# Patient Record
Sex: Male | Born: 1935 | Race: White | Hispanic: No | Marital: Married | Smoking: Former smoker
Health system: Southern US, Community
[De-identification: ages and names within clinical notes are randomized; demographics above are authoritative.]

## PROBLEM LIST (undated history)

## (undated) DIAGNOSIS — I639 Cerebral infarction, unspecified: Secondary | ICD-10-CM

## (undated) DIAGNOSIS — E213 Hyperparathyroidism, unspecified: Secondary | ICD-10-CM

## (undated) DIAGNOSIS — Z8546 Personal history of malignant neoplasm of prostate: Secondary | ICD-10-CM

## (undated) DIAGNOSIS — G4733 Obstructive sleep apnea (adult) (pediatric): Secondary | ICD-10-CM

## (undated) DIAGNOSIS — E291 Testicular hypofunction: Secondary | ICD-10-CM

## (undated) DIAGNOSIS — C801 Malignant (primary) neoplasm, unspecified: Secondary | ICD-10-CM

## (undated) DIAGNOSIS — I350 Nonrheumatic aortic (valve) stenosis: Secondary | ICD-10-CM

## (undated) DIAGNOSIS — F32A Depression, unspecified: Secondary | ICD-10-CM

## (undated) DIAGNOSIS — B029 Zoster without complications: Secondary | ICD-10-CM

## (undated) DIAGNOSIS — G459 Transient cerebral ischemic attack, unspecified: Secondary | ICD-10-CM

## (undated) DIAGNOSIS — I219 Acute myocardial infarction, unspecified: Secondary | ICD-10-CM

## (undated) DIAGNOSIS — D649 Anemia, unspecified: Secondary | ICD-10-CM

## (undated) DIAGNOSIS — I6522 Occlusion and stenosis of left carotid artery: Secondary | ICD-10-CM

## (undated) DIAGNOSIS — N200 Calculus of kidney: Secondary | ICD-10-CM

## (undated) DIAGNOSIS — N184 Chronic kidney disease, stage 4 (severe): Secondary | ICD-10-CM

## (undated) DIAGNOSIS — Z9889 Other specified postprocedural states: Secondary | ICD-10-CM

## (undated) DIAGNOSIS — R5383 Other fatigue: Secondary | ICD-10-CM

## (undated) DIAGNOSIS — E785 Hyperlipidemia, unspecified: Secondary | ICD-10-CM

## (undated) DIAGNOSIS — E119 Type 2 diabetes mellitus without complications: Secondary | ICD-10-CM

## (undated) DIAGNOSIS — F419 Anxiety disorder, unspecified: Secondary | ICD-10-CM

## (undated) DIAGNOSIS — I480 Paroxysmal atrial fibrillation: Secondary | ICD-10-CM

## (undated) DIAGNOSIS — I2510 Atherosclerotic heart disease of native coronary artery without angina pectoris: Secondary | ICD-10-CM

## (undated) DIAGNOSIS — E538 Deficiency of other specified B group vitamins: Secondary | ICD-10-CM

## (undated) DIAGNOSIS — F329 Major depressive disorder, single episode, unspecified: Secondary | ICD-10-CM

## (undated) DIAGNOSIS — E039 Hypothyroidism, unspecified: Secondary | ICD-10-CM

## (undated) DIAGNOSIS — M1990 Unspecified osteoarthritis, unspecified site: Secondary | ICD-10-CM

## (undated) DIAGNOSIS — J449 Chronic obstructive pulmonary disease, unspecified: Secondary | ICD-10-CM

## (undated) DIAGNOSIS — E669 Obesity, unspecified: Secondary | ICD-10-CM

## (undated) DIAGNOSIS — M353 Polymyalgia rheumatica: Secondary | ICD-10-CM

## (undated) HISTORY — DX: Obstructive sleep apnea (adult) (pediatric): G47.33

## (undated) HISTORY — DX: Other fatigue: R53.83

## (undated) HISTORY — DX: Anxiety disorder, unspecified: F41.9

## (undated) HISTORY — DX: Nonrheumatic aortic (valve) stenosis: I35.0

## (undated) HISTORY — PX: APPENDECTOMY: SHX54

## (undated) HISTORY — DX: Chronic kidney disease, stage 4 (severe): N18.4

## (undated) HISTORY — DX: Testicular hypofunction: E29.1

## (undated) HISTORY — DX: Transient cerebral ischemic attack, unspecified: G45.9

## (undated) HISTORY — PX: PROSTATECTOMY: SHX69

## (undated) HISTORY — PX: CYSTECTOMY: SUR359

## (undated) HISTORY — DX: Occlusion and stenosis of left carotid artery: I65.22

## (undated) HISTORY — PX: HERNIA REPAIR: SHX51

## (undated) HISTORY — DX: Hyperlipidemia, unspecified: E78.5

## (undated) HISTORY — PX: VASECTOMY: SHX75

## (undated) HISTORY — DX: Cerebral infarction, unspecified: I63.9

## (undated) HISTORY — DX: Type 2 diabetes mellitus without complications: E11.9

## (undated) HISTORY — DX: Deficiency of other specified B group vitamins: E53.8

## (undated) HISTORY — DX: Depression, unspecified: F32.A

## (undated) HISTORY — PX: LUMBAR SPINE SURGERY: SHX701

## (undated) HISTORY — PX: OTHER SURGICAL HISTORY: SHX169

## (undated) HISTORY — DX: Obesity, unspecified: E66.9

## (undated) HISTORY — DX: Hypothyroidism, unspecified: E03.9

## (undated) HISTORY — DX: Calculus of kidney: N20.0

## (undated) HISTORY — DX: Hyperparathyroidism, unspecified: E21.3

## (undated) HISTORY — DX: Personal history of malignant neoplasm of prostate: Z85.46

## (undated) HISTORY — DX: Other specified postprocedural states: Z98.890

## (undated) HISTORY — DX: Chronic obstructive pulmonary disease, unspecified: J44.9

## (undated) HISTORY — DX: Anemia, unspecified: D64.9

## (undated) HISTORY — PX: CHOLECYSTECTOMY: SHX55

## (undated) HISTORY — DX: Major depressive disorder, single episode, unspecified: F32.9

## (undated) HISTORY — DX: Polymyalgia rheumatica: M35.3

## (undated) HISTORY — DX: Atherosclerotic heart disease of native coronary artery without angina pectoris: I25.10

## (undated) HISTORY — DX: Unspecified osteoarthritis, unspecified site: M19.90

## (undated) HISTORY — DX: Paroxysmal atrial fibrillation: I48.0

## (undated) HISTORY — DX: Zoster without complications: B02.9

## (undated) HISTORY — PX: TOTAL KNEE ARTHROPLASTY: SHX125

---

## 1998-03-25 ENCOUNTER — Inpatient Hospital Stay (HOSPITAL_COMMUNITY): Admission: RE | Admit: 1998-03-25 | Discharge: 1998-03-28 | Payer: Self-pay | Admitting: Urology

## 1998-07-20 ENCOUNTER — Other Ambulatory Visit: Admission: RE | Admit: 1998-07-20 | Discharge: 1998-07-20 | Payer: Self-pay | Admitting: Urology

## 1999-08-13 ENCOUNTER — Encounter: Payer: Self-pay | Admitting: Emergency Medicine

## 1999-08-13 ENCOUNTER — Inpatient Hospital Stay (HOSPITAL_COMMUNITY): Admission: EM | Admit: 1999-08-13 | Discharge: 1999-08-16 | Payer: Self-pay | Admitting: Emergency Medicine

## 2000-08-09 ENCOUNTER — Encounter: Payer: Self-pay | Admitting: Urology

## 2000-08-17 ENCOUNTER — Inpatient Hospital Stay (HOSPITAL_COMMUNITY): Admission: RE | Admit: 2000-08-17 | Discharge: 2000-08-21 | Payer: Self-pay | Admitting: Specialist

## 2002-09-30 ENCOUNTER — Emergency Department (HOSPITAL_COMMUNITY): Admission: EM | Admit: 2002-09-30 | Discharge: 2002-09-30 | Payer: Self-pay | Admitting: Emergency Medicine

## 2004-12-12 ENCOUNTER — Emergency Department (HOSPITAL_COMMUNITY): Admission: EM | Admit: 2004-12-12 | Discharge: 2004-12-12 | Payer: Self-pay | Admitting: Emergency Medicine

## 2004-12-27 ENCOUNTER — Ambulatory Visit: Payer: Self-pay | Admitting: Family Medicine

## 2005-01-30 ENCOUNTER — Ambulatory Visit: Payer: Self-pay | Admitting: Family Medicine

## 2005-03-28 ENCOUNTER — Ambulatory Visit: Payer: Self-pay | Admitting: Family Medicine

## 2005-12-19 ENCOUNTER — Ambulatory Visit: Payer: Self-pay | Admitting: Family Medicine

## 2006-02-08 ENCOUNTER — Ambulatory Visit: Payer: Self-pay | Admitting: Family Medicine

## 2006-02-22 ENCOUNTER — Ambulatory Visit: Payer: Self-pay | Admitting: Family Medicine

## 2006-11-28 ENCOUNTER — Emergency Department (HOSPITAL_COMMUNITY): Admission: EM | Admit: 2006-11-28 | Discharge: 2006-11-28 | Payer: Self-pay | Admitting: Emergency Medicine

## 2007-04-16 ENCOUNTER — Ambulatory Visit: Payer: Self-pay | Admitting: Family Medicine

## 2007-04-16 LAB — CONVERTED CEMR LAB
ALT: 17 units/L (ref 0–40)
AST: 28 units/L (ref 0–37)
Alkaline Phosphatase: 50 units/L (ref 39–117)
BUN: 26 mg/dL — ABNORMAL HIGH (ref 6–23)
Basophils Relative: 0.2 % (ref 0.0–1.0)
CO2: 33 meq/L — ABNORMAL HIGH (ref 19–32)
Calcium: 9.2 mg/dL (ref 8.4–10.5)
Chloride: 108 meq/L (ref 96–112)
Cholesterol: 220 mg/dL (ref 0–200)
Creatinine, Ser: 1.4 mg/dL (ref 0.4–1.5)
Hemoglobin: 14.9 g/dL (ref 13.0–17.0)
Monocytes Absolute: 0.6 10*3/uL (ref 0.2–0.7)
Monocytes Relative: 9.8 % (ref 3.0–11.0)
Platelets: 268 10*3/uL (ref 150–400)
RBC: 3.82 M/uL — ABNORMAL LOW (ref 4.22–5.81)
RDW: 13.7 % (ref 11.5–14.6)
Total Bilirubin: 1 mg/dL (ref 0.3–1.2)
Total CHOL/HDL Ratio: 4.1
Total Protein: 7.2 g/dL (ref 6.0–8.3)
VLDL: 17 mg/dL (ref 0–40)

## 2008-04-21 ENCOUNTER — Ambulatory Visit: Payer: Self-pay | Admitting: Family Medicine

## 2008-04-21 DIAGNOSIS — D649 Anemia, unspecified: Secondary | ICD-10-CM

## 2008-04-21 DIAGNOSIS — M722 Plantar fascial fibromatosis: Secondary | ICD-10-CM

## 2008-04-21 DIAGNOSIS — M129 Arthropathy, unspecified: Secondary | ICD-10-CM | POA: Insufficient documentation

## 2008-04-21 DIAGNOSIS — E785 Hyperlipidemia, unspecified: Secondary | ICD-10-CM

## 2008-04-21 DIAGNOSIS — N390 Urinary tract infection, site not specified: Secondary | ICD-10-CM

## 2008-04-21 DIAGNOSIS — M7150 Other bursitis, not elsewhere classified, unspecified site: Secondary | ICD-10-CM

## 2008-04-21 DIAGNOSIS — T50995A Adverse effect of other drugs, medicaments and biological substances, initial encounter: Secondary | ICD-10-CM

## 2008-04-21 DIAGNOSIS — J309 Allergic rhinitis, unspecified: Secondary | ICD-10-CM | POA: Insufficient documentation

## 2008-04-21 DIAGNOSIS — E039 Hypothyroidism, unspecified: Secondary | ICD-10-CM | POA: Insufficient documentation

## 2008-04-21 DIAGNOSIS — I2510 Atherosclerotic heart disease of native coronary artery without angina pectoris: Secondary | ICD-10-CM | POA: Insufficient documentation

## 2008-04-30 ENCOUNTER — Ambulatory Visit: Payer: Self-pay | Admitting: Family Medicine

## 2008-04-30 DIAGNOSIS — D179 Benign lipomatous neoplasm, unspecified: Secondary | ICD-10-CM | POA: Insufficient documentation

## 2008-05-04 ENCOUNTER — Ambulatory Visit: Payer: Self-pay | Admitting: Family Medicine

## 2008-05-04 DIAGNOSIS — K921 Melena: Secondary | ICD-10-CM | POA: Insufficient documentation

## 2008-05-04 LAB — CONVERTED CEMR LAB
OCCULT 1: NEGATIVE
OCCULT 2: NEGATIVE
OCCULT 3: NEGATIVE

## 2008-05-05 ENCOUNTER — Encounter: Payer: Self-pay | Admitting: Family Medicine

## 2008-06-11 ENCOUNTER — Inpatient Hospital Stay (HOSPITAL_COMMUNITY): Admission: EM | Admit: 2008-06-11 | Discharge: 2008-06-12 | Payer: Self-pay | Admitting: Emergency Medicine

## 2008-06-11 ENCOUNTER — Ambulatory Visit: Payer: Self-pay | Admitting: Internal Medicine

## 2008-06-12 ENCOUNTER — Encounter: Payer: Self-pay | Admitting: Internal Medicine

## 2008-06-12 ENCOUNTER — Ambulatory Visit: Payer: Self-pay | Admitting: *Deleted

## 2008-06-12 ENCOUNTER — Encounter: Payer: Self-pay | Admitting: Family Medicine

## 2008-06-15 ENCOUNTER — Ambulatory Visit: Payer: Self-pay | Admitting: Internal Medicine

## 2008-06-15 DIAGNOSIS — R079 Chest pain, unspecified: Secondary | ICD-10-CM

## 2008-06-15 DIAGNOSIS — M549 Dorsalgia, unspecified: Secondary | ICD-10-CM | POA: Insufficient documentation

## 2008-06-15 LAB — CONVERTED CEMR LAB: Hemoglobin: 13.2 g/dL

## 2008-06-17 ENCOUNTER — Ambulatory Visit: Payer: Self-pay | Admitting: Family Medicine

## 2008-06-17 DIAGNOSIS — E538 Deficiency of other specified B group vitamins: Secondary | ICD-10-CM | POA: Insufficient documentation

## 2008-06-20 DIAGNOSIS — Z8546 Personal history of malignant neoplasm of prostate: Secondary | ICD-10-CM

## 2008-06-23 ENCOUNTER — Ambulatory Visit: Payer: Self-pay | Admitting: Family Medicine

## 2008-06-23 DIAGNOSIS — R5381 Other malaise: Secondary | ICD-10-CM | POA: Insufficient documentation

## 2008-06-23 DIAGNOSIS — D510 Vitamin B12 deficiency anemia due to intrinsic factor deficiency: Secondary | ICD-10-CM

## 2008-06-23 DIAGNOSIS — R5383 Other fatigue: Secondary | ICD-10-CM

## 2008-06-23 LAB — CONVERTED CEMR LAB: Hemoglobin: 11.5 g/dL

## 2008-06-25 ENCOUNTER — Ambulatory Visit: Payer: Self-pay | Admitting: Family Medicine

## 2008-06-25 ENCOUNTER — Ambulatory Visit: Payer: Self-pay | Admitting: Cardiology

## 2008-06-25 ENCOUNTER — Inpatient Hospital Stay (HOSPITAL_COMMUNITY): Admission: EM | Admit: 2008-06-25 | Discharge: 2008-06-30 | Payer: Self-pay | Admitting: Emergency Medicine

## 2008-06-25 DIAGNOSIS — I4892 Unspecified atrial flutter: Secondary | ICD-10-CM

## 2008-07-07 ENCOUNTER — Ambulatory Visit: Payer: Self-pay | Admitting: Internal Medicine

## 2008-07-07 ENCOUNTER — Ambulatory Visit: Payer: Self-pay

## 2008-07-07 LAB — CONVERTED CEMR LAB
BUN: 27 mg/dL — ABNORMAL HIGH
Basophils Absolute: 0.1 K/uL
Basophils Relative: 0.9 %
CO2: 26 meq/L
Calcium: 8.4 mg/dL
Chloride: 106 meq/L
Creatinine, Ser: 1.6 mg/dL — ABNORMAL HIGH
Eosinophils Absolute: 0.4 K/uL
Eosinophils Relative: 5.6 % — ABNORMAL HIGH
GFR calc Af Amer: 53 mL/min
GFR calc non Af Amer: 44 mL/min
Glucose, Bld: 114 mg/dL — ABNORMAL HIGH
HCT: 31.5 % — ABNORMAL LOW
Hemoglobin: 10.5 g/dL — ABNORMAL LOW
Lymphocytes Relative: 19.3 %
MCHC: 33.4 g/dL
MCV: 113.5 fL — ABNORMAL HIGH
Monocytes Absolute: 0.6 K/uL
Monocytes Relative: 8.8 %
Neutro Abs: 4.8 K/uL
Neutrophils Relative %: 65.4 %
Platelets: 411 K/uL — ABNORMAL HIGH
Potassium: 4.5 meq/L
RBC: 2.77 M/uL — ABNORMAL LOW
RDW: 16.6 % — ABNORMAL HIGH
Sodium: 140 meq/L
WBC: 7.3 10*3/microliter

## 2008-07-08 ENCOUNTER — Ambulatory Visit: Payer: Self-pay | Admitting: Family Medicine

## 2008-07-08 DIAGNOSIS — R609 Edema, unspecified: Secondary | ICD-10-CM

## 2008-07-09 ENCOUNTER — Telehealth: Payer: Self-pay | Admitting: Family Medicine

## 2008-07-10 ENCOUNTER — Telehealth: Payer: Self-pay | Admitting: Family Medicine

## 2008-07-16 ENCOUNTER — Ambulatory Visit: Payer: Self-pay | Admitting: Internal Medicine

## 2008-07-16 ENCOUNTER — Ambulatory Visit: Payer: Self-pay

## 2008-07-21 ENCOUNTER — Telehealth: Payer: Self-pay | Admitting: Family Medicine

## 2008-07-21 ENCOUNTER — Ambulatory Visit: Payer: Self-pay | Admitting: Family Medicine

## 2008-07-21 DIAGNOSIS — J209 Acute bronchitis, unspecified: Secondary | ICD-10-CM

## 2008-07-21 DIAGNOSIS — E876 Hypokalemia: Secondary | ICD-10-CM | POA: Insufficient documentation

## 2008-07-21 DIAGNOSIS — K625 Hemorrhage of anus and rectum: Secondary | ICD-10-CM

## 2008-07-22 LAB — CONVERTED CEMR LAB
Basophils Relative: 0.8 % (ref 0.0–3.0)
Chloride: 109 meq/L (ref 96–112)
Eosinophils Relative: 16.4 % — ABNORMAL HIGH (ref 0.0–5.0)
HCT: 33.5 % — ABNORMAL LOW (ref 39.0–52.0)
MCV: 109.5 fL — ABNORMAL HIGH (ref 78.0–100.0)
Monocytes Absolute: 0.9 10*3/uL (ref 0.1–1.0)
Monocytes Relative: 9.5 % (ref 3.0–12.0)
Neutrophils Relative %: 58.5 % (ref 43.0–77.0)
Platelets: 353 10*3/uL (ref 150–400)
Potassium: 6 meq/L — ABNORMAL HIGH (ref 3.5–5.1)
RBC: 3.06 M/uL — ABNORMAL LOW (ref 4.22–5.81)
TSH: 6.44 microintl units/mL — ABNORMAL HIGH (ref 0.35–5.50)
WBC: 9.3 10*3/uL (ref 4.5–10.5)

## 2008-07-24 ENCOUNTER — Ambulatory Visit: Payer: Self-pay | Admitting: Pulmonary Disease

## 2008-07-24 DIAGNOSIS — F329 Major depressive disorder, single episode, unspecified: Secondary | ICD-10-CM

## 2008-07-24 DIAGNOSIS — G4710 Hypersomnia, unspecified: Secondary | ICD-10-CM | POA: Insufficient documentation

## 2008-07-27 ENCOUNTER — Telehealth (INDEPENDENT_AMBULATORY_CARE_PROVIDER_SITE_OTHER): Payer: Self-pay | Admitting: *Deleted

## 2008-07-28 ENCOUNTER — Ambulatory Visit: Payer: Self-pay | Admitting: Family Medicine

## 2008-07-28 DIAGNOSIS — L559 Sunburn, unspecified: Secondary | ICD-10-CM

## 2008-07-28 DIAGNOSIS — H1030 Unspecified acute conjunctivitis, unspecified eye: Secondary | ICD-10-CM | POA: Insufficient documentation

## 2008-08-11 ENCOUNTER — Ambulatory Visit: Payer: Self-pay | Admitting: Family Medicine

## 2008-08-11 ENCOUNTER — Telehealth: Payer: Self-pay | Admitting: Family Medicine

## 2008-08-11 LAB — CONVERTED CEMR LAB: Hemoglobin: 12.9 g/dL

## 2008-08-13 LAB — CONVERTED CEMR LAB: Vitamin B-12: >1500 pg/mL — ABNORMAL HIGH (ref 211–911)

## 2008-08-19 ENCOUNTER — Ambulatory Visit: Payer: Self-pay | Admitting: Family Medicine

## 2008-08-21 ENCOUNTER — Ambulatory Visit (HOSPITAL_BASED_OUTPATIENT_CLINIC_OR_DEPARTMENT_OTHER): Admission: RE | Admit: 2008-08-21 | Discharge: 2008-08-21 | Payer: Self-pay | Admitting: Pulmonary Disease

## 2008-08-21 ENCOUNTER — Encounter: Payer: Self-pay | Admitting: Pulmonary Disease

## 2008-08-27 ENCOUNTER — Ambulatory Visit: Payer: Self-pay | Admitting: Internal Medicine

## 2008-08-27 LAB — CONVERTED CEMR LAB
Basophils Absolute: 0 10*3/uL (ref 0.0–0.1)
Chloride: 109 meq/L (ref 96–112)
Eosinophils Absolute: 0.7 10*3/uL (ref 0.0–0.7)
GFR calc non Af Amer: 23 mL/min
Lymphocytes Relative: 16.9 % (ref 12.0–46.0)
MCHC: 33.6 g/dL (ref 30.0–36.0)
MCV: 105.8 fL — ABNORMAL HIGH (ref 78.0–100.0)
Neutrophils Relative %: 66.5 % (ref 43.0–77.0)
Platelets: 275 10*3/uL (ref 150–400)
Potassium: 6.1 meq/L (ref 3.5–5.1)
Sodium: 142 meq/L (ref 135–145)

## 2008-08-28 ENCOUNTER — Ambulatory Visit: Payer: Self-pay | Admitting: Cardiovascular Disease

## 2008-09-01 ENCOUNTER — Ambulatory Visit: Payer: Self-pay | Admitting: Cardiovascular Disease

## 2008-09-01 LAB — CONVERTED CEMR LAB
CO2: 26 meq/L (ref 19–32)
Calcium: 8.5 mg/dL (ref 8.4–10.5)
Chloride: 109 meq/L (ref 96–112)
Potassium: 4.3 meq/L (ref 3.5–5.1)
Sodium: 143 meq/L (ref 135–145)

## 2008-09-02 ENCOUNTER — Ambulatory Visit: Payer: Self-pay | Admitting: Family Medicine

## 2008-09-03 ENCOUNTER — Ambulatory Visit: Payer: Self-pay | Admitting: Internal Medicine

## 2008-09-07 ENCOUNTER — Ambulatory Visit: Payer: Self-pay | Admitting: Pulmonary Disease

## 2008-09-28 ENCOUNTER — Ambulatory Visit: Payer: Self-pay | Admitting: Pulmonary Disease

## 2008-09-28 DIAGNOSIS — G4733 Obstructive sleep apnea (adult) (pediatric): Secondary | ICD-10-CM

## 2008-10-01 ENCOUNTER — Ambulatory Visit: Payer: Self-pay | Admitting: Family Medicine

## 2008-10-01 DIAGNOSIS — E875 Hyperkalemia: Secondary | ICD-10-CM

## 2008-10-01 DIAGNOSIS — N19 Unspecified kidney failure: Secondary | ICD-10-CM | POA: Insufficient documentation

## 2008-10-01 DIAGNOSIS — L259 Unspecified contact dermatitis, unspecified cause: Secondary | ICD-10-CM | POA: Insufficient documentation

## 2008-10-03 LAB — CONVERTED CEMR LAB
BUN: 46 mg/dL — ABNORMAL HIGH (ref 6–23)
Basophils Relative: 0.3 % (ref 0.0–3.0)
CO2: 29 meq/L (ref 19–32)
Chloride: 104 meq/L (ref 96–112)
Creatinine, Ser: 2.4 mg/dL — ABNORMAL HIGH (ref 0.4–1.5)
Eosinophils Absolute: 0.5 10*3/uL (ref 0.0–0.7)
Eosinophils Relative: 6 % — ABNORMAL HIGH (ref 0.0–5.0)
Hemoglobin: 14 g/dL (ref 13.0–17.0)
MCV: 99.4 fL (ref 78.0–100.0)
Neutro Abs: 6.3 10*3/uL (ref 1.4–7.7)
Neutrophils Relative %: 68.4 % (ref 43.0–77.0)
Phosphorus: 3.6 mg/dL (ref 2.3–4.6)
RBC: 4.12 M/uL — ABNORMAL LOW (ref 4.22–5.81)
WBC: 9 10*3/uL (ref 4.5–10.5)

## 2008-10-06 ENCOUNTER — Encounter: Payer: Self-pay | Admitting: Pulmonary Disease

## 2008-10-07 ENCOUNTER — Ambulatory Visit: Payer: Self-pay | Admitting: Family Medicine

## 2008-10-07 DIAGNOSIS — R252 Cramp and spasm: Secondary | ICD-10-CM | POA: Insufficient documentation

## 2008-10-14 ENCOUNTER — Ambulatory Visit: Payer: Self-pay | Admitting: Internal Medicine

## 2008-10-15 LAB — CONVERTED CEMR LAB
BUN: 28 mg/dL — ABNORMAL HIGH (ref 6–23)
Chloride: 108 meq/L (ref 96–112)
Glucose, Bld: 114 mg/dL — ABNORMAL HIGH (ref 70–99)
Potassium: 4.8 meq/L (ref 3.5–5.1)

## 2008-10-26 ENCOUNTER — Encounter: Payer: Self-pay | Admitting: Family Medicine

## 2008-10-27 ENCOUNTER — Ambulatory Visit: Payer: Self-pay | Admitting: Internal Medicine

## 2008-11-09 ENCOUNTER — Ambulatory Visit (HOSPITAL_COMMUNITY): Admission: RE | Admit: 2008-11-09 | Discharge: 2008-11-09 | Payer: Self-pay | Admitting: Internal Medicine

## 2008-11-09 ENCOUNTER — Ambulatory Visit: Payer: Self-pay | Admitting: Internal Medicine

## 2008-12-24 ENCOUNTER — Inpatient Hospital Stay (HOSPITAL_COMMUNITY): Admission: EM | Admit: 2008-12-24 | Discharge: 2008-12-25 | Payer: Self-pay | Admitting: Emergency Medicine

## 2008-12-31 ENCOUNTER — Ambulatory Visit: Payer: Self-pay

## 2008-12-31 ENCOUNTER — Ambulatory Visit: Payer: Self-pay | Admitting: Internal Medicine

## 2009-01-17 ENCOUNTER — Emergency Department (HOSPITAL_BASED_OUTPATIENT_CLINIC_OR_DEPARTMENT_OTHER): Admission: EM | Admit: 2009-01-17 | Discharge: 2009-01-17 | Payer: Self-pay | Admitting: Emergency Medicine

## 2009-04-05 ENCOUNTER — Encounter: Payer: Self-pay | Admitting: Internal Medicine

## 2009-04-05 ENCOUNTER — Ambulatory Visit: Payer: Self-pay | Admitting: Internal Medicine

## 2009-04-05 DIAGNOSIS — R0989 Other specified symptoms and signs involving the circulatory and respiratory systems: Secondary | ICD-10-CM

## 2009-04-05 DIAGNOSIS — I359 Nonrheumatic aortic valve disorder, unspecified: Secondary | ICD-10-CM | POA: Insufficient documentation

## 2009-04-19 ENCOUNTER — Encounter: Admission: RE | Admit: 2009-04-19 | Discharge: 2009-04-19 | Payer: Self-pay | Admitting: Family Medicine

## 2009-04-22 ENCOUNTER — Ambulatory Visit (HOSPITAL_COMMUNITY): Admission: RE | Admit: 2009-04-22 | Discharge: 2009-04-22 | Payer: Self-pay | Admitting: Family Medicine

## 2009-09-27 ENCOUNTER — Telehealth: Payer: Self-pay | Admitting: Pulmonary Disease

## 2010-04-01 ENCOUNTER — Encounter: Payer: Self-pay | Admitting: Internal Medicine

## 2010-04-04 ENCOUNTER — Ambulatory Visit: Payer: Self-pay | Admitting: Internal Medicine

## 2010-04-04 ENCOUNTER — Encounter: Payer: Self-pay | Admitting: Internal Medicine

## 2010-04-04 ENCOUNTER — Ambulatory Visit (HOSPITAL_COMMUNITY): Admission: RE | Admit: 2010-04-04 | Discharge: 2010-04-04 | Payer: Self-pay | Admitting: Internal Medicine

## 2010-04-04 ENCOUNTER — Ambulatory Visit: Payer: Self-pay

## 2010-04-07 ENCOUNTER — Ambulatory Visit: Payer: Self-pay | Admitting: Internal Medicine

## 2010-04-07 DIAGNOSIS — I10 Essential (primary) hypertension: Secondary | ICD-10-CM

## 2010-10-10 ENCOUNTER — Inpatient Hospital Stay (HOSPITAL_COMMUNITY): Admission: AD | Admit: 2010-10-10 | Discharge: 2010-10-15 | Payer: Self-pay | Admitting: Internal Medicine

## 2010-10-10 ENCOUNTER — Encounter: Payer: Self-pay | Admitting: Emergency Medicine

## 2010-10-10 ENCOUNTER — Ambulatory Visit: Payer: Self-pay | Admitting: Diagnostic Radiology

## 2010-10-10 ENCOUNTER — Ambulatory Visit: Payer: Self-pay | Admitting: Cardiology

## 2010-10-10 ENCOUNTER — Encounter (INDEPENDENT_AMBULATORY_CARE_PROVIDER_SITE_OTHER): Payer: Self-pay | Admitting: Internal Medicine

## 2010-10-12 ENCOUNTER — Encounter (INDEPENDENT_AMBULATORY_CARE_PROVIDER_SITE_OTHER): Payer: Self-pay | Admitting: Internal Medicine

## 2010-10-22 ENCOUNTER — Ambulatory Visit: Payer: Self-pay | Admitting: Emergency Medicine

## 2010-10-22 ENCOUNTER — Inpatient Hospital Stay (HOSPITAL_COMMUNITY): Admission: EM | Admit: 2010-10-22 | Discharge: 2010-11-07 | Payer: Self-pay | Admitting: Emergency Medicine

## 2010-11-03 ENCOUNTER — Encounter: Payer: Self-pay | Admitting: Internal Medicine

## 2010-12-13 ENCOUNTER — Telehealth: Payer: Self-pay | Admitting: Family Medicine

## 2010-12-28 ENCOUNTER — Emergency Department (HOSPITAL_BASED_OUTPATIENT_CLINIC_OR_DEPARTMENT_OTHER)
Admission: EM | Admit: 2010-12-28 | Discharge: 2010-12-29 | Disposition: A | Discharge status: Other Acute Inpatient Hospital | Payer: Self-pay | Source: Home / Self Care | Admitting: Emergency Medicine

## 2010-12-28 LAB — COMPREHENSIVE METABOLIC PANEL
ALT: 29 U/L (ref 0–53)
AST: 27 U/L (ref 0–37)
Albumin: 3.8 g/dL (ref 3.5–5.2)
Alkaline Phosphatase: 75 U/L (ref 39–117)
BUN: 33 mg/dL — ABNORMAL HIGH (ref 6–23)
CO2: 25 mEq/L (ref 19–32)
Calcium: 8.9 mg/dL (ref 8.4–10.5)
Chloride: 107 mEq/L (ref 96–112)
Creatinine, Ser: 1.9 mg/dL — ABNORMAL HIGH (ref 0.4–1.5)
GFR calc Af Amer: 41 mL/min — ABNORMAL LOW (ref >60)
GFR calc non Af Amer: 34 mL/min — ABNORMAL LOW (ref >60)
Glucose, Bld: 106 mg/dL — ABNORMAL HIGH (ref 70–99)
Potassium: 4.5 mEq/L (ref 3.5–5.1)
Sodium: 141 mEq/L (ref 135–145)
Total Bilirubin: 0.5 mg/dL (ref 0.3–1.2)
Total Protein: 7.3 g/dL (ref 6.0–8.3)

## 2010-12-28 LAB — DIFFERENTIAL
Basophils Absolute: 0 10*3/uL (ref 0.0–0.1)
Basophils Relative: 0 % (ref 0–1)
Eosinophils Absolute: 0.3 10*3/uL (ref 0.0–0.7)
Eosinophils Relative: 3 % (ref 0–5)
Lymphocytes Relative: 46 % (ref 12–46)
Lymphs Abs: 4.6 10*3/uL — ABNORMAL HIGH (ref 0.7–4.0)
Monocytes Absolute: 1 10*3/uL (ref 0.1–1.0)
Monocytes Relative: 10 % (ref 3–12)
Neutro Abs: 4.1 10*3/uL (ref 1.7–7.7)
Neutrophils Relative %: 41 % — ABNORMAL LOW (ref 43–77)

## 2010-12-28 LAB — CBC
HCT: 38.4 % — ABNORMAL LOW (ref 39.0–52.0)
Hemoglobin: 12.6 g/dL — ABNORMAL LOW (ref 13.0–17.0)
MCH: 32.6 pg (ref 26.0–34.0)
MCHC: 32.8 g/dL (ref 30.0–36.0)
MCV: 99.2 fL (ref 78.0–100.0)
Platelets: 287 10*3/uL (ref 150–400)
RBC: 3.87 MIL/uL — ABNORMAL LOW (ref 4.22–5.81)
RDW: 13.8 % (ref 11.5–15.5)
WBC: 10 10*3/uL (ref 4.0–10.5)

## 2010-12-28 LAB — PROTIME-INR
INR: 0.99 (ref 0.00–1.49)
Prothrombin Time: 13.3 seconds (ref 11.6–15.2)

## 2010-12-28 LAB — APTT: aPTT: 28 seconds (ref 24–37)

## 2010-12-28 LAB — GLUCOSE, CAPILLARY: Glucose-Capillary: 119 mg/dL — ABNORMAL HIGH (ref 70–99)

## 2010-12-29 ENCOUNTER — Inpatient Hospital Stay (HOSPITAL_COMMUNITY)
Admission: EM | Admit: 2010-12-29 | Discharge: 2010-12-30 | Payer: Self-pay | Attending: Internal Medicine | Admitting: Internal Medicine

## 2010-12-29 ENCOUNTER — Encounter (INDEPENDENT_AMBULATORY_CARE_PROVIDER_SITE_OTHER): Payer: Self-pay | Admitting: Internal Medicine

## 2010-12-29 ENCOUNTER — Other Ambulatory Visit: Payer: Self-pay | Admitting: Internal Medicine

## 2010-12-29 LAB — CBC
HCT: 36 % — ABNORMAL LOW (ref 39.0–52.0)
Hemoglobin: 11.6 g/dL — ABNORMAL LOW (ref 13.0–17.0)
MCH: 32.9 pg (ref 26.0–34.0)
MCHC: 32.2 g/dL (ref 30.0–36.0)
MCV: 102 fL — ABNORMAL HIGH (ref 78.0–100.0)
Platelets: 224 10*3/uL (ref 150–400)
RBC: 3.53 MIL/uL — ABNORMAL LOW (ref 4.22–5.81)
RDW: 14.4 % (ref 11.5–15.5)
WBC: 7.5 10*3/uL (ref 4.0–10.5)

## 2010-12-29 LAB — CARDIAC PANEL(CRET KIN+CKTOT+MB+TROPI)
CK, MB: 2.1 ng/mL (ref 0.3–4.0)
Relative Index: INVALID (ref 0.0–2.5)
Total CK: 47 U/L (ref 7–232)
Troponin I: 0.02 ng/mL (ref 0.00–0.06)

## 2010-12-29 LAB — COMPREHENSIVE METABOLIC PANEL
ALT: 20 U/L (ref 0–53)
AST: 23 U/L (ref 0–37)
Albumin: 3.2 g/dL — ABNORMAL LOW (ref 3.5–5.2)
Alkaline Phosphatase: 51 U/L (ref 39–117)
BUN: 26 mg/dL — ABNORMAL HIGH (ref 6–23)
CO2: 24 mEq/L (ref 19–32)
Calcium: 8.8 mg/dL (ref 8.4–10.5)
Chloride: 109 mEq/L (ref 96–112)
Creatinine, Ser: 1.73 mg/dL — ABNORMAL HIGH (ref 0.4–1.5)
GFR calc Af Amer: 46 mL/min — ABNORMAL LOW (ref >60)
GFR calc non Af Amer: 38 mL/min — ABNORMAL LOW (ref >60)
Glucose, Bld: 97 mg/dL (ref 70–99)
Potassium: 4.3 mEq/L (ref 3.5–5.1)
Sodium: 139 mEq/L (ref 135–145)
Total Bilirubin: 0.4 mg/dL (ref 0.3–1.2)
Total Protein: 6.4 g/dL (ref 6.0–8.3)

## 2010-12-29 LAB — LIPID PANEL
Cholesterol: 156 mg/dL (ref 0–200)
HDL: 35 mg/dL — ABNORMAL LOW (ref >39)
LDL Cholesterol: 92 mg/dL (ref 0–99)
Total CHOL/HDL Ratio: 4.5 RATIO
Triglycerides: 147 mg/dL (ref <150)
VLDL: 29 mg/dL (ref 0–40)

## 2010-12-29 LAB — URINALYSIS, ROUTINE W REFLEX MICROSCOPIC
Bilirubin Urine: NEGATIVE
Hemoglobin, Urine: NEGATIVE
Ketones, ur: NEGATIVE mg/dL
Nitrite: NEGATIVE
Protein, ur: NEGATIVE mg/dL
Specific Gravity, Urine: 1.019 (ref 1.005–1.030)
Urine Glucose, Fasting: NEGATIVE mg/dL
Urobilinogen, UA: 0.2 mg/dL (ref 0.0–1.0)
pH: 5.5 (ref 5.0–8.0)

## 2010-12-29 LAB — POCT CARDIAC MARKERS
CKMB, poc: <1 ng/mL — ABNORMAL LOW (ref 1.0–8.0)
Myoglobin, poc: 86.1 ng/mL (ref 12–200)
Troponin i, poc: <0.05 ng/mL (ref 0.00–0.09)

## 2010-12-29 LAB — MRSA PCR SCREENING: MRSA by PCR: NEGATIVE

## 2010-12-29 LAB — GLUCOSE, CAPILLARY
Glucose-Capillary: 92 mg/dL (ref 70–99)
Glucose-Capillary: 113 mg/dL — ABNORMAL HIGH (ref 70–99)
Glucose-Capillary: 111 mg/dL — ABNORMAL HIGH (ref 70–99)

## 2010-12-29 LAB — HEMOGLOBIN A1C
Hgb A1c MFr Bld: 5.4 % (ref <5.7)
Mean Plasma Glucose: 108 mg/dL (ref <117)

## 2010-12-29 LAB — PROTIME-INR
INR: 1.05 (ref 0.00–1.49)
Prothrombin Time: 13.9 seconds (ref 11.6–15.2)

## 2010-12-29 LAB — APTT: aPTT: 27 seconds (ref 24–37)

## 2010-12-30 LAB — GLUCOSE, CAPILLARY
Glucose-Capillary: 123 mg/dL — ABNORMAL HIGH (ref 70–99)
Glucose-Capillary: 105 mg/dL — ABNORMAL HIGH (ref 70–99)

## 2011-01-13 NOTE — Discharge Summary (Signed)
[name], [name] NO.:  [id number]  MEDICAL RECORD NO.:  [id number]          PATIENT TYPE:  INP  LOCATION:  3004                         FACILITY:  [facility]  PHYSICIAN:  Pincus Large, MD     DATE OF BIRTH:  1923-07-20  DATE OF ADMISSION:  12/29/2010 DATE OF DISCHARGE:  12/30/2010                              DISCHARGE SUMMARY   PRIMARY CARE PHYSICIAN:  Dorma Russell.  REASON FOR ADMISSION:  Difficulty speaking.  DISCHARGE DIAGNOSIS:  TIA.  SECONDARY DIAGNOSES: 1. Multiple lacunar infarct on the basal ganglia bilaterally. 2. 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 3. Hypertension. 4. Hypothyroid. 5. History of COPD. 6. Chronic kidney disease. 7. History of coronary artery disease.  DISCHARGE MEDICATIONS:  Plavix 75 mg daily, Crestor 10 mg daily, albuterol inhaler 2 puffs q. 4 p.r.n., Lasix 20 daily, Vicodin 500/5 q. 6 p.r.n., hydralazine 25 mg t.i.d., levothyroxine 137 mcg daily, metoprolol 12.5 p.o. b.i.d., trazodone 50 mg q.h.s. p.r.n.  HOSPITAL COURSE:  An 77 year old male with known history of coronary artery disease, chronic kidney disease, hypertension, COPD, and hypothyroid presented to the emergency room with difficulty speaking when he was talking to his daughter on the phone.  The patient's symptoms resolved at home within one hour.  He was brought to the emergency room.  His initial CAT scan was negative.  The patient denies any loss of consciousness, no headache, no visual disturbance, and no difficulty swallowing.  The patient came to the emergency room.  He was admitted to rule out TIA.  He had an MRA and MRI of the brain which shows; MRA of the head shows 2.5 mm aneurysm versus infundibulum assisted with the left superior cerebellar artery at the basilar tip, also small vessel disease.  No other significant proximal stenosis.  MRI of the brain shows no acute intracranial  abnormalities.  The patient was evaluated by PT/OT and swallowing eval and he recommend PT as an outpatient.  The patient wants to go home today.  He was seen by neurology yesterday and the recommendation is to discontinue the aspirin since the patient was taking aspirin at home and change it to Plavix 75 daily.  Radiology that was done during the admission was; 1. CT of head, which resolved with no acute intracranial pathology,     moderate severe cortical volume loss, scattered small vessel     ischemic microangiopathy.  MRI of the brain shows no acute     endocrine abnormalities, age, advanced atrophy  , removed lacunar     infarct at the basal ganglia.  MRA of the brain shows 2.5 mm     aneurysm versus infundibulum associated with the left superior     cerebellar artery at the basilar tip, small vessel disease and no     other significant proximal stenosis.  LABORATORY DATA:  WBC was 7.5, hemoglobin 11.6, and platelet was 224,000.  His blood sugar was 105, 123, and 113.  His CMP shows sodium is 139, potassium is 4.3, chloride is 109, bicarb is 24, glucose  is 97, BUN is 26, and creatinine is 1.7.  His cardiac enzymes x1 were negative. His cholesterol is 156, triglyceride is 147 daily, LDL is 92, and HDL is 35.  Urine was negative and MRSA by PCR was negative.  DISPOSITION: 1. Disposition is home with PT. 2. Diet, 2 g sodium.          ______________________________ Pincus Large, MD     SA/MEDQ  D:  12/30/2010  T:  12/30/2010  Job:  [id number]  Electronically Signed by Pincus Large MD on 01/13/2011 11:48:24 AM

## 2011-01-15 ENCOUNTER — Encounter: Payer: Self-pay | Admitting: Family Medicine

## 2011-01-20 NOTE — Consult Note (Signed)
NAMEDIVIT, STIPP NO.:  [id number]  MEDICAL RECORD NO.:  [id number]          PATIENT TYPE:  INP  LOCATION:  3004                         FACILITY:  [facility]  PHYSICIAN:  Joycelyn Schmid, MD   DATE OF BIRTH:  1923/03/31  DATE OF CONSULTATION: DATE OF DISCHARGE:                                CONSULTATION   REASON FOR CONSULTATION:  TIA.  HISTORY OF PRESENT ILLNESS:  This is a pleasant 77 year old Caucasian male with chronic kidney disease, hypertension, hypothyroidism, CAD, diabetes, COPD, who is in normal state of health on the date of December 28, 2010.  The patient had just finished seeing his son who had driven home.  At approximately 7 o'clock, his son was called by his sister stating that the father was on the phone and was unable to express himself.  The patient's son called his father immediately and noted that his speech had returned back to normal.  Per wife, the patient was found to be with expressive aphasia for approximately 40 minutes which resolved completely.  The patient describes as knowing what he wanted to say but unable to form the words.  The patient states he had no diplopia, nausea, vomiting, tingling, weakness, one-sided facial droop, or decreased sensation during this period.  The patient has not had symptoms since that time.  The patient has been on aspirin 81 mg per day.  Due to concern that this could have been a stroke, the patient's son drove him to the emergency department where he was admitted.  The patient underwent MRI of the brain which showed no acute intracranial abnormality, age-related atrophy, remote lacunar infarcts of the basal ganglia bilaterally.  MRA of the head showed a 2.5-mm aneurysm versus infundibulum associated with a left superior cerebellar artery at the basilar tip, small vessel disease, no other significant proximal stenosis.  The patient's carotid Dopplers and 2-D echo are pending.  PAST MEDICAL  HISTORY: 1. Chronic kidney disease. 2. Hypertension. 3. Hypothyroidism. 4. CAD. 5. Diabetes. 6. COPD.  Stroke risk factors:  Hypertension and diabetes.  NIH stroke scale was 0.  Modified Rankin 0.  IV t-PA was not given as symptoms resolved.  MEDICATIONS:  At home, the patient is on Flexeril, Lasix, trazodone, Lopressor, aspirin 81 mg, hydrocodone, tramadol, hydralazine, and Mobic.  FAMILY HISTORY:  Positive for CAD.  SOCIAL HISTORY:  The patient lives with his wife.  Does not drink, smoke, or do illicit drugs.  ALLERGIES:  TETANUS HORSE BLOOD EXTRACT.  REVIEW OF SYSTEMS:  Negative with the exception above.  PHYSICAL EXAMINATION:  VITAL SIGNS:  Temperature is 98.6, blood pressure is 130/74, heart rate 89, and respirations 24. LUNGS:  Clear to auscultation bilaterally. ABDOMEN:  Soft, nontender, and nondistended. NECK:  Negative for bruits. CARDIOVASCULAR:  Regular rate and rhythm. SKIN:  Warm, dry, and intact. HEENT:  Pupils are equal, round, and reactive to light and accommodating.  Extraocular movements are intact.  Tongue is midline. Face is equal and symmetrical with exception of right eye ptosis which family states has been there for multiple years. NEUROLOGIC:  He moves all extremities 5/5, no drift.  Finger-to-nose and heel-to-shin are smooth.  The patient's sensation is grossly intact to pinprick, touch, and vibration.  The patient's deep tendon reflexes are 2+ throughout the upper extremities and 1+ throughout the lower extremities with downgoing toes bilaterally.  TEST RESULTS:  Sodium is 139, potassium 4.3, chloride 109, CO2 of 24, BUN 26, creatinine 1.73, and glucose 97.  White blood cell count 7.4, platelets 224, hemoglobin 11.6, and hematocrit 36.0.  PT is 13.9 and INR is 1.05.  UA is negative.  HbA1c is 5.4.  Cholesterol is 156, triglycerides 147, HDL 35, and LDL 92.  CT of head is negative.  MRI of brain as noted above.  Carotid Dopplers and 2-D echo  are pending.  ASSESSMENT:  This is an 77 year old male with transient ischemic attack in the form of expressive aphasia lasting approximately 40 minutes, now resolved.  RECOMMENDATIONS: 1. Change aspirin to Plavix. 2. Continue stroke workup (carotid, TTE). 3. Stroke team to follow up.     Felicie Morn, PA-C   ______________________________ Joycelyn Schmid, MD    DS/MEDQ  D:  12/29/2010  T:  12/30/2010  Job:  [id number]  Electronically Signed by Felicie Morn PA-C on 12/30/2010 11:04:28 AM Electronically Signed by Joycelyn Schmid  on 01/18/2011 02:54:56 PM

## 2011-01-22 LAB — CONVERTED CEMR LAB
Alkaline Phosphatase: 48 units/L (ref 39–117)
Basophils Absolute: 0 10*3/uL (ref 0.0–0.1)
Bilirubin, Direct: 0.1 mg/dL (ref 0.0–0.3)
Blood in Urine, dipstick: NEGATIVE
Calcium: 9.1 mg/dL (ref 8.4–10.5)
Cholesterol: 199 mg/dL (ref 0–200)
GFR calc Af Amer: 62 mL/min
Glucose, Bld: 81 mg/dL (ref 70–99)
HCT: 42.8 % (ref 39.0–52.0)
HDL: 51.9 mg/dL (ref >39.0)
LDL Cholesterol: 135 mg/dL — ABNORMAL HIGH (ref 0–99)
Lymphocytes Relative: 21.6 % (ref 12.0–46.0)
MCHC: 33.5 g/dL (ref 30.0–36.0)
Monocytes Absolute: 0.5 10*3/uL (ref 0.1–1.0)
Monocytes Relative: 7.7 % (ref 3.0–12.0)
Nitrite: NEGATIVE
Platelets: 227 10*3/uL (ref 150–400)
Potassium: 5.1 meq/L (ref 3.5–5.1)
RDW: 14.5 % (ref 11.5–14.6)
Sodium: 144 meq/L (ref 135–145)
TSH: 3.57 microintl units/mL (ref 0.35–5.50)
Total Bilirubin: 0.9 mg/dL (ref 0.3–1.2)
Total CHOL/HDL Ratio: 3.8
Total Protein: 7 g/dL (ref 6.0–8.3)
Triglycerides: 61 mg/dL (ref 0–149)
Urobilinogen, UA: 0.2

## 2011-01-26 NOTE — Miscellaneous (Signed)
Summary: Orders Update  Clinical Lists Changes  Orders: Added new Test order of Carotid Duplex (Carotid Duplex) - Signed 

## 2011-01-26 NOTE — Progress Notes (Signed)
Summary: Pt req a muscle relaxer for spasm in back. Pain pill not helping  Phone Note Call from Patient Call back at Home Phone [phone]   Caller: [name] - cousin Summary of Call: Pt is needing a muscle relaxer because muscle in back are having spasms. Pain pills not working. Pls call.  Initial call taken by: Lucy Antigua,  December 13, 2010 2:54 PM  Follow-up for Phone Call        Per Dr. Scotty Court- Pt is no longer a pt in this practice.  Spoke to Summers and she said that Dr. Donovan Kail had called in. Follow-up by: Romualdo Bolk, CMA [name],  December 16, 2010 11:33 AM

## 2011-01-26 NOTE — Consult Note (Signed)
Summary: [facility] Consultation Report  [facility] Consultation Report   Imported By: Earl Many 11/15/2010 15:41:04  _____________________________________________________________________  External Attachment:    Type:   Image     Comment:   External Document

## 2011-01-26 NOTE — Assessment & Plan Note (Signed)
Summary: f1y  Medications Added ASPIRIN EC 325 MG TBEC (ASPIRIN) Take one tablet by mouth daily PREDNISONE 5 MG TABS (PREDNISONE) as directed ALPRAZOLAM 0.25 MG TABS (ALPRAZOLAM) as needed      Allergies Added:   Visit Type:  Follow-up Referring Provider:  Alger Memos Primary Provider:  Duane Lope  CC:  weakness.  History of Present Illness: Mr. Everingham is an 77 year old man with coronary artery disease who suffered a non-ST elevation myocardial infarction in July 2009.  He underwent a bare-metal stenting to a subtotally occluded diagonal branch.  Remainder of his coronary artery showed mild nonobstructive disease.  He had normal LV function.  He also has a history of paroxysmal atrial fibrillation complicated by sick sinus syndrome.  Holter monitor showed bradycardia with sinus pauses up to 2.5 seconds, but nothing longer than that. Not on coumadin due to severe epistaxis requiring hospitalization. He also had an episode of acute renal failure and hyperkalemia and Dr. Excell Seltzer stopped his diuretics and diclofenac.   We have been following him up primarily for fatigue.  He had a CPX test back in November 2009 which showed a VO2 of 14.4 which was 75% predicted, slope was 35.  O2 pulse was normal.  When VO2 was corrected for his body weight, it was 17.6.   Echo last week whcih I reviewed EF 55-60% grade 1 diastolic dysfx. Mild AS  Returns for yearly f/u.  Says he went to Dr. Dareen Piano in Rheumatology and found to have "arthritis in his blood stream". Has been on prednisone for 6 months. Feels much better but is gaining weight.  No CP/SOB/palpitations/syncope. No orthopnea or edema. BP much better controlled.  Continues to get lightheaded at times but has not had any syncope.  Continues to care for wife with severe Alzheimer's which is getting worse.   Current Medications (verified): 1)  Simvastatin 40 Mg  Tabs (Simvastatin) .Marland Kitchen.. 1 By Mouth Once Daily 2)  Levothyroxine Sodium 175 Mcg  Tabs (Levothyroxine Sodium) .... Take 1 Once Daily 3)  Tramadol Hcl 50 Mg Tabs (Tramadol Hcl) .... Take 1 Once Daily 4)  Dilt-Cd 180 Mg Xr24h-Cap (Diltiazem Hcl Coated Beads) .... Take 1 Once Daily 5)  Aspirin 81 Mg Tbec (Aspirin) .... Take 2 Once Daily 6)  Magnesium Oxide 400 Mg Caps (Magnesium Oxide) .... Take 2 As Needed At Bedtime 7)  Prednisone 5 Mg Tabs (Prednisone) .... As Directed 8)  Alprazolam 0.25 Mg Tabs (Alprazolam) .... As Needed  Allergies (verified): 1)  ! * Horse Serum  Past History:  Past Medical History: Coronary artery disease    --s/p NSTEMI and BMS stent diagonal07/09 Mild AS     --Echo 2009 mean gradient 9mm HG. AVA 2.18     --Echo 4/11 mild AS mean gradient 10mm HG Paroxysmal AF     --Holter monitor 2.5 sec pauses Fatigue    --CPX 11/09 VO2  14.4 (75% predicte)d, slope 35.  O2 pulse normal.  VO2 corrected for body weight 17.6.  Hypothyroidism Hyperlipidemia Prostate cancer, hx of Allergic rhinitis Diabetes mellitus, type II diet controlled  Shingles Left Carotid Stenosis B 12 deficiency Anemia Osteoarthritis Nephrolithiasis Obesity OSA, AHI 15 PSG 09/06/08  Vital Signs:  Patient profile:   78 year old male Height:      68 inches Weight:      212 pounds Pulse rate:   76 / minute BP sitting:   124 / 60  (left arm) Cuff size:   regular  Vitals Entered By: Hardin Negus,  RMA (April 07, 2010 10:04 AM)  Physical Exam  General:  Gen: well appearing. no resp difficulty HEENT: normal Neck: supple. no JVD. Carotids 2+ bilat; + bilateral radiated  bruits. No lymphadenopathy or thryomegaly appreciated. Cor: PMI nondisplaced. Regular rate & rhythm. No rubs, gallops. 2/6 systolic murmur at RSB. Lungs: clear Abdomen: obese. soft, nontender, nondistended. Good bowel sounds. Extremities: no cyanosis, clubbing, rash, edema Neuro: alert & orientedx3, cranial nerves grossly intact. moves all 4 extremities w/o difficulty. affect pleasant    Impression  & Recommendations:  Problem # 1:  AORTIC STENOSIS/ INSUFFICIENCY, NON-RHEUMATIC (ICD-424.1) Stable. Very mild. Repeat echo in 2 years.  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 Unable to tolerate coumadin due to nosebleeds. Increase ASA to 325.  Problem # 3:  HYPERTENSION, BENIGN (ICD-401.1) Blood pressure well controlled. Continue current regimen.  Problem # 4:  CORONARY ARTERY DISEASE (ICD-414.00) Stable. No evidence of ischemia. Continue current regimen.  Other Orders: EKG w/ Interpretation (93000)  Patient Instructions: 1)  Increase Aspirin to 325mg  daily 2)  Follow up in 1 year  Appended Document: f1y ECG: NSR 76  No ST-T wave abnormalities.

## 2011-03-07 LAB — COMPREHENSIVE METABOLIC PANEL
ALT: 19 U/L (ref 0–53)
ALT: 21 U/L (ref 0–53)
AST: 18 U/L (ref 0–37)
AST: 19 U/L (ref 0–37)
AST: 20 U/L (ref 0–37)
Albumin: 1.6 g/dL — ABNORMAL LOW (ref 3.5–5.2)
Albumin: 1.8 g/dL — ABNORMAL LOW (ref 3.5–5.2)
Albumin: 1.8 g/dL — ABNORMAL LOW (ref 3.5–5.2)
Alkaline Phosphatase: 76 U/L (ref 39–117)
Alkaline Phosphatase: 84 U/L (ref 39–117)
BUN: 13 mg/dL (ref 6–23)
BUN: 18 mg/dL (ref 6–23)
CO2: 24 mEq/L (ref 19–32)
CO2: 24 mEq/L (ref 19–32)
CO2: 24 mEq/L (ref 19–32)
CO2: 24 mEq/L (ref 19–32)
CO2: 27 mEq/L (ref 19–32)
Calcium: 7.5 mg/dL — ABNORMAL LOW (ref 8.4–10.5)
Calcium: 7.7 mg/dL — ABNORMAL LOW (ref 8.4–10.5)
Chloride: 107 mEq/L (ref 96–112)
Chloride: 111 mEq/L (ref 96–112)
Chloride: 112 mEq/L (ref 96–112)
Creatinine, Ser: 1.9 mg/dL — ABNORMAL HIGH (ref 0.4–1.5)
Creatinine, Ser: 2.03 mg/dL — ABNORMAL HIGH (ref 0.4–1.5)
Creatinine, Ser: 2.05 mg/dL — ABNORMAL HIGH (ref 0.4–1.5)
Creatinine, Ser: 2.41 mg/dL — ABNORMAL HIGH (ref 0.4–1.5)
Creatinine, Ser: 2.48 mg/dL — ABNORMAL HIGH (ref 0.4–1.5)
GFR calc Af Amer: 30 mL/min — ABNORMAL LOW (ref >60)
GFR calc Af Amer: 38 mL/min — ABNORMAL LOW (ref >60)
GFR calc Af Amer: 38 mL/min — ABNORMAL LOW (ref >60)
GFR calc Af Amer: 41 mL/min — ABNORMAL LOW (ref >60)
GFR calc non Af Amer: 25 mL/min — ABNORMAL LOW (ref >60)
GFR calc non Af Amer: 26 mL/min — ABNORMAL LOW (ref >60)
GFR calc non Af Amer: 31 mL/min — ABNORMAL LOW (ref >60)
GFR calc non Af Amer: 33 mL/min — ABNORMAL LOW (ref >60)
GFR calc non Af Amer: 34 mL/min — ABNORMAL LOW (ref >60)
Glucose, Bld: 118 mg/dL — ABNORMAL HIGH (ref 70–99)
Glucose, Bld: 126 mg/dL — ABNORMAL HIGH (ref 70–99)
Glucose, Bld: 96 mg/dL (ref 70–99)
Potassium: 3.9 mEq/L (ref 3.5–5.1)
Potassium: 4.2 mEq/L (ref 3.5–5.1)
Potassium: 4.5 mEq/L (ref 3.5–5.1)
Sodium: 141 mEq/L (ref 135–145)
Sodium: 141 mEq/L (ref 135–145)
Sodium: 142 mEq/L (ref 135–145)
Total Bilirubin: 0.6 mg/dL (ref 0.3–1.2)
Total Bilirubin: 0.7 mg/dL (ref 0.3–1.2)
Total Bilirubin: 0.8 mg/dL (ref 0.3–1.2)
Total Bilirubin: 1 mg/dL (ref 0.3–1.2)
Total Bilirubin: 1 mg/dL (ref 0.3–1.2)

## 2011-03-07 LAB — CBC
HCT: 29.1 % — ABNORMAL LOW (ref 39.0–52.0)
HCT: 32.4 % — ABNORMAL LOW (ref 39.0–52.0)
HCT: 33 % — ABNORMAL LOW (ref 39.0–52.0)
HCT: 35.4 % — ABNORMAL LOW (ref 39.0–52.0)
Hemoglobin: 10.1 g/dL — ABNORMAL LOW (ref 13.0–17.0)
Hemoglobin: 10.4 g/dL — ABNORMAL LOW (ref 13.0–17.0)
Hemoglobin: 10.8 g/dL — ABNORMAL LOW (ref 13.0–17.0)
Hemoglobin: 11.2 g/dL — ABNORMAL LOW (ref 13.0–17.0)
Hemoglobin: 11.9 g/dL — ABNORMAL LOW (ref 13.0–17.0)
MCH: 34.4 pg — ABNORMAL HIGH (ref 26.0–34.0)
MCH: 34.5 pg — ABNORMAL HIGH (ref 26.0–34.0)
MCH: 34.5 pg — ABNORMAL HIGH (ref 26.0–34.0)
MCH: 34.8 pg — ABNORMAL HIGH (ref 26.0–34.0)
MCH: 35 pg — ABNORMAL HIGH (ref 26.0–34.0)
MCHC: 33.3 g/dL (ref 30.0–36.0)
MCHC: 34 g/dL (ref 30.0–36.0)
MCHC: 34.7 g/dL (ref 30.0–36.0)
MCV: 101.5 fL — ABNORMAL HIGH (ref 78.0–100.0)
MCV: 102.1 fL — ABNORMAL HIGH (ref 78.0–100.0)
MCV: 102.4 fL — ABNORMAL HIGH (ref 78.0–100.0)
MCV: 102.6 fL — ABNORMAL HIGH (ref 78.0–100.0)
Platelets: 280 10*3/uL (ref 150–400)
Platelets: 319 10*3/uL (ref 150–400)
Platelets: 364 10*3/uL (ref 150–400)
RBC: 2.89 MIL/uL — ABNORMAL LOW (ref 4.22–5.81)
RBC: 3.03 MIL/uL — ABNORMAL LOW (ref 4.22–5.81)
RBC: 3.1 MIL/uL — ABNORMAL LOW (ref 4.22–5.81)
RBC: 3.4 MIL/uL — ABNORMAL LOW (ref 4.22–5.81)
RDW: 14.8 % (ref 11.5–15.5)
WBC: 10.8 10*3/uL — ABNORMAL HIGH (ref 4.0–10.5)
WBC: 17.4 10*3/uL — ABNORMAL HIGH (ref 4.0–10.5)
WBC: 8.5 10*3/uL (ref 4.0–10.5)
WBC: 9.9 10*3/uL (ref 4.0–10.5)

## 2011-03-07 LAB — BASIC METABOLIC PANEL
BUN: 13 mg/dL (ref 6–23)
BUN: 14 mg/dL (ref 6–23)
BUN: 23 mg/dL (ref 6–23)
BUN: 25 mg/dL — ABNORMAL HIGH (ref 6–23)
CO2: 32 mEq/L (ref 19–32)
CO2: 32 mEq/L (ref 19–32)
CO2: 35 mEq/L — ABNORMAL HIGH (ref 19–32)
Calcium: 7.5 mg/dL — ABNORMAL LOW (ref 8.4–10.5)
Calcium: 7.8 mg/dL — ABNORMAL LOW (ref 8.4–10.5)
Calcium: 7.8 mg/dL — ABNORMAL LOW (ref 8.4–10.5)
Chloride: 104 mEq/L (ref 96–112)
Chloride: 107 mEq/L (ref 96–112)
Chloride: 110 mEq/L (ref 96–112)
Creatinine, Ser: 2.45 mg/dL — ABNORMAL HIGH (ref 0.4–1.5)
Creatinine, Ser: 2.63 mg/dL — ABNORMAL HIGH (ref 0.4–1.5)
Creatinine, Ser: 2.64 mg/dL — ABNORMAL HIGH (ref 0.4–1.5)
Creatinine, Ser: 2.85 mg/dL — ABNORMAL HIGH (ref 0.4–1.5)
Creatinine, Ser: 2.86 mg/dL — ABNORMAL HIGH (ref 0.4–1.5)
GFR calc Af Amer: 24 mL/min — ABNORMAL LOW (ref >60)
GFR calc Af Amer: 26 mL/min — ABNORMAL LOW (ref >60)
GFR calc non Af Amer: 20 mL/min — ABNORMAL LOW (ref >60)
GFR calc non Af Amer: 21 mL/min — ABNORMAL LOW (ref >60)
GFR calc non Af Amer: 23 mL/min — ABNORMAL LOW (ref >60)
GFR calc non Af Amer: 23 mL/min — ABNORMAL LOW (ref >60)
GFR calc non Af Amer: 25 mL/min — ABNORMAL LOW (ref >60)
Glucose, Bld: 106 mg/dL — ABNORMAL HIGH (ref 70–99)
Glucose, Bld: 114 mg/dL — ABNORMAL HIGH (ref 70–99)
Glucose, Bld: 129 mg/dL — ABNORMAL HIGH (ref 70–99)
Glucose, Bld: 129 mg/dL — ABNORMAL HIGH (ref 70–99)
Glucose, Bld: 130 mg/dL — ABNORMAL HIGH (ref 70–99)
Potassium: 3.5 mEq/L (ref 3.5–5.1)
Potassium: 3.6 mEq/L (ref 3.5–5.1)
Potassium: 3.7 mEq/L (ref 3.5–5.1)
Potassium: 4.4 mEq/L (ref 3.5–5.1)
Sodium: 142 mEq/L (ref 135–145)
Sodium: 142 mEq/L (ref 135–145)
Sodium: 142 mEq/L (ref 135–145)
Sodium: 143 mEq/L (ref 135–145)

## 2011-03-07 LAB — BRAIN NATRIURETIC PEPTIDE
Pro B Natriuretic peptide (BNP): 143 pg/mL — ABNORMAL HIGH (ref 0.0–100.0)
Pro B Natriuretic peptide (BNP): 801 pg/mL — ABNORMAL HIGH (ref 0.0–100.0)

## 2011-03-07 LAB — GLUCOSE, CAPILLARY
Glucose-Capillary: 100 mg/dL — ABNORMAL HIGH (ref 70–99)
Glucose-Capillary: 101 mg/dL — ABNORMAL HIGH (ref 70–99)
Glucose-Capillary: 101 mg/dL — ABNORMAL HIGH (ref 70–99)
Glucose-Capillary: 102 mg/dL — ABNORMAL HIGH (ref 70–99)
Glucose-Capillary: 102 mg/dL — ABNORMAL HIGH (ref 70–99)
Glucose-Capillary: 102 mg/dL — ABNORMAL HIGH (ref 70–99)
Glucose-Capillary: 103 mg/dL — ABNORMAL HIGH (ref 70–99)
Glucose-Capillary: 104 mg/dL — ABNORMAL HIGH (ref 70–99)
Glucose-Capillary: 105 mg/dL — ABNORMAL HIGH (ref 70–99)
Glucose-Capillary: 105 mg/dL — ABNORMAL HIGH (ref 70–99)
Glucose-Capillary: 105 mg/dL — ABNORMAL HIGH (ref 70–99)
Glucose-Capillary: 106 mg/dL — ABNORMAL HIGH (ref 70–99)
Glucose-Capillary: 108 mg/dL — ABNORMAL HIGH (ref 70–99)
Glucose-Capillary: 111 mg/dL — ABNORMAL HIGH (ref 70–99)
Glucose-Capillary: 111 mg/dL — ABNORMAL HIGH (ref 70–99)
Glucose-Capillary: 115 mg/dL — ABNORMAL HIGH (ref 70–99)
Glucose-Capillary: 115 mg/dL — ABNORMAL HIGH (ref 70–99)
Glucose-Capillary: 116 mg/dL — ABNORMAL HIGH (ref 70–99)
Glucose-Capillary: 116 mg/dL — ABNORMAL HIGH (ref 70–99)
Glucose-Capillary: 116 mg/dL — ABNORMAL HIGH (ref 70–99)
Glucose-Capillary: 121 mg/dL — ABNORMAL HIGH (ref 70–99)
Glucose-Capillary: 121 mg/dL — ABNORMAL HIGH (ref 70–99)
Glucose-Capillary: 122 mg/dL — ABNORMAL HIGH (ref 70–99)
Glucose-Capillary: 123 mg/dL — ABNORMAL HIGH (ref 70–99)
Glucose-Capillary: 125 mg/dL — ABNORMAL HIGH (ref 70–99)
Glucose-Capillary: 129 mg/dL — ABNORMAL HIGH (ref 70–99)
Glucose-Capillary: 131 mg/dL — ABNORMAL HIGH (ref 70–99)
Glucose-Capillary: 137 mg/dL — ABNORMAL HIGH (ref 70–99)
Glucose-Capillary: 143 mg/dL — ABNORMAL HIGH (ref 70–99)
Glucose-Capillary: 90 mg/dL (ref 70–99)
Glucose-Capillary: 91 mg/dL (ref 70–99)
Glucose-Capillary: 97 mg/dL (ref 70–99)
Glucose-Capillary: 97 mg/dL (ref 70–99)
Glucose-Capillary: 97 mg/dL (ref 70–99)
Glucose-Capillary: 99 mg/dL (ref 70–99)
Glucose-Capillary: 99 mg/dL (ref 70–99)

## 2011-03-07 LAB — TSH: TSH: 2.764 u[IU]/mL (ref 0.350–4.500)

## 2011-03-07 LAB — VANCOMYCIN, TROUGH: Vancomycin Tr: 20.7 ug/mL — ABNORMAL HIGH (ref 10.0–20.0)

## 2011-03-08 LAB — BASIC METABOLIC PANEL
BUN: 32 mg/dL — ABNORMAL HIGH (ref 6–23)
BUN: 34 mg/dL — ABNORMAL HIGH (ref 6–23)
Chloride: 106 mEq/L (ref 96–112)
Chloride: 106 mEq/L (ref 96–112)
GFR calc non Af Amer: 31 mL/min — ABNORMAL LOW (ref >60)
GFR calc non Af Amer: 46 mL/min — ABNORMAL LOW (ref >60)
Glucose, Bld: 175 mg/dL — ABNORMAL HIGH (ref 70–99)
Potassium: 4.3 mEq/L (ref 3.5–5.1)
Potassium: 4.4 mEq/L (ref 3.5–5.1)
Sodium: 138 mEq/L (ref 135–145)
Sodium: 141 mEq/L (ref 135–145)

## 2011-03-08 LAB — COMPREHENSIVE METABOLIC PANEL
ALT: 27 U/L (ref 0–53)
ALT: 281 U/L — ABNORMAL HIGH (ref 0–53)
ALT: 39 U/L (ref 0–53)
AST: 212 U/L — ABNORMAL HIGH (ref 0–37)
AST: 22 U/L (ref 0–37)
AST: 38 U/L — ABNORMAL HIGH (ref 0–37)
AST: 43 U/L — ABNORMAL HIGH (ref 0–37)
AST: 61 U/L — ABNORMAL HIGH (ref 0–37)
Albumin: 2.4 g/dL — ABNORMAL LOW (ref 3.5–5.2)
Albumin: 2.6 g/dL — ABNORMAL LOW (ref 3.5–5.2)
Albumin: 2.6 g/dL — ABNORMAL LOW (ref 3.5–5.2)
Alkaline Phosphatase: 85 U/L (ref 39–117)
Alkaline Phosphatase: 92 U/L (ref 39–117)
BUN: 28 mg/dL — ABNORMAL HIGH (ref 6–23)
BUN: 30 mg/dL — ABNORMAL HIGH (ref 6–23)
BUN: 20 mg/dL (ref 6–23)
CO2: 24 mEq/L (ref 19–32)
CO2: 27 mEq/L (ref 19–32)
CO2: 27 mEq/L (ref 19–32)
CO2: 26 mEq/L (ref 19–32)
Calcium: 7.5 mg/dL — ABNORMAL LOW (ref 8.4–10.5)
Calcium: 7.7 mg/dL — ABNORMAL LOW (ref 8.4–10.5)
Calcium: 8.1 mg/dL — ABNORMAL LOW (ref 8.4–10.5)
Calcium: 8.2 mg/dL — ABNORMAL LOW (ref 8.4–10.5)
Calcium: 8.5 mg/dL (ref 8.4–10.5)
Chloride: 102 mEq/L (ref 96–112)
Chloride: 104 mEq/L (ref 96–112)
Chloride: 109 mEq/L (ref 96–112)
Chloride: 99 mEq/L (ref 96–112)
Chloride: 106 mEq/L (ref 96–112)
Creatinine, Ser: 1.57 mg/dL — ABNORMAL HIGH (ref 0.4–1.5)
Creatinine, Ser: 1.58 mg/dL — ABNORMAL HIGH (ref 0.4–1.5)
Creatinine, Ser: 1.68 mg/dL — ABNORMAL HIGH (ref 0.4–1.5)
Creatinine, Ser: 1.5 mg/dL (ref 0.4–1.5)
GFR calc Af Amer: 35 mL/min — ABNORMAL LOW (ref >60)
GFR calc Af Amer: 47 mL/min — ABNORMAL LOW (ref >60)
GFR calc Af Amer: 48 mL/min — ABNORMAL LOW (ref >60)
GFR calc Af Amer: 51 mL/min — ABNORMAL LOW (ref >60)
GFR calc Af Amer: 51 mL/min — ABNORMAL LOW (ref >60)
GFR calc non Af Amer: 27 mL/min — ABNORMAL LOW (ref >60)
GFR calc non Af Amer: 29 mL/min — ABNORMAL LOW (ref >60)
GFR calc non Af Amer: 44 mL/min — ABNORMAL LOW (ref >60)
Glucose, Bld: 114 mg/dL — ABNORMAL HIGH (ref 70–99)
Glucose, Bld: 136 mg/dL — ABNORMAL HIGH (ref 70–99)
Glucose, Bld: 159 mg/dL — ABNORMAL HIGH (ref 70–99)
Potassium: 5 mEq/L (ref 3.5–5.1)
Potassium: 5.1 mEq/L (ref 3.5–5.1)
Sodium: 133 mEq/L — ABNORMAL LOW (ref 135–145)
Sodium: 136 mEq/L (ref 135–145)
Sodium: 137 mEq/L (ref 135–145)
Total Bilirubin: 0.2 mg/dL — ABNORMAL LOW (ref 0.3–1.2)
Total Bilirubin: 0.7 mg/dL (ref 0.3–1.2)
Total Bilirubin: 1.3 mg/dL — ABNORMAL HIGH (ref 0.3–1.2)
Total Bilirubin: 1 mg/dL (ref 0.3–1.2)
Total Protein: 4.8 g/dL — ABNORMAL LOW (ref 6.0–8.3)
Total Protein: 5.9 g/dL — ABNORMAL LOW (ref 6.0–8.3)
Total Protein: 6.2 g/dL (ref 6.0–8.3)
Total Protein: 6.3 g/dL (ref 6.0–8.3)

## 2011-03-08 LAB — CBC
HCT: 33.8 % — ABNORMAL LOW (ref 39.0–52.0)
HCT: 34.9 % — ABNORMAL LOW (ref 39.0–52.0)
HCT: 36.4 % — ABNORMAL LOW (ref 39.0–52.0)
HCT: 42.1 % (ref 39.0–52.0)
Hemoglobin: 11.5 g/dL — ABNORMAL LOW (ref 13.0–17.0)
Hemoglobin: 11.9 g/dL — ABNORMAL LOW (ref 13.0–17.0)
Hemoglobin: 12.3 g/dL — ABNORMAL LOW (ref 13.0–17.0)
Hemoglobin: 13.7 g/dL (ref 13.0–17.0)
Hemoglobin: 14.1 g/dL (ref 13.0–17.0)
Hemoglobin: 14.9 g/dL (ref 13.0–17.0)
MCH: 33.9 pg (ref 26.0–34.0)
MCH: 34.5 pg — ABNORMAL HIGH (ref 26.0–34.0)
MCH: 34.7 pg — ABNORMAL HIGH (ref 26.0–34.0)
MCH: 34.8 pg — ABNORMAL HIGH (ref 26.0–34.0)
MCH: 34.8 pg — ABNORMAL HIGH (ref 26.0–34.0)
MCHC: 33.4 g/dL (ref 30.0–36.0)
MCHC: 33.8 g/dL (ref 30.0–36.0)
MCHC: 34 g/dL (ref 30.0–36.0)
MCHC: 34.1 g/dL (ref 30.0–36.0)
MCHC: 34.2 g/dL (ref 30.0–36.0)
MCHC: 34 g/dL (ref 30.0–36.0)
MCV: 101.6 fL — ABNORMAL HIGH (ref 78.0–100.0)
MCV: 101.9 fL — ABNORMAL HIGH (ref 78.0–100.0)
MCV: 102.1 fL — ABNORMAL HIGH (ref 78.0–100.0)
MCV: 102.4 fL — ABNORMAL HIGH (ref 78.0–100.0)
Platelets: 223 10*3/uL (ref 150–400)
Platelets: 262 10*3/uL (ref 150–400)
Platelets: 345 10*3/uL (ref 150–400)
Platelets: 361 10*3/uL (ref 150–400)
RBC: 3.41 MIL/uL — ABNORMAL LOW (ref 4.22–5.81)
RBC: 3.55 MIL/uL — ABNORMAL LOW (ref 4.22–5.81)
RBC: 4.02 MIL/uL — ABNORMAL LOW (ref 4.22–5.81)
RBC: 4.16 MIL/uL — ABNORMAL LOW (ref 4.22–5.81)
RBC: 4.27 MIL/uL (ref 4.22–5.81)
RDW: 14.5 % (ref 11.5–15.5)
RDW: 14.7 % (ref 11.5–15.5)
RDW: 14.8 % (ref 11.5–15.5)
RDW: 15.1 % (ref 11.5–15.5)
RDW: 15.2 % (ref 11.5–15.5)
RDW: 15.3 % (ref 11.5–15.5)
WBC: 13.5 10*3/uL — ABNORMAL HIGH (ref 4.0–10.5)
WBC: 16.4 10*3/uL — ABNORMAL HIGH (ref 4.0–10.5)
WBC: 17.3 10*3/uL — ABNORMAL HIGH (ref 4.0–10.5)
WBC: 19.3 10*3/uL — ABNORMAL HIGH (ref 4.0–10.5)

## 2011-03-08 LAB — CULTURE, BLOOD (ROUTINE X 2)
Culture  Setup Time: 201110180807
Culture  Setup Time: 201110292019
Culture: NO GROWTH
Culture: NO GROWTH

## 2011-03-08 LAB — DIFFERENTIAL
Basophils Absolute: 0 10*3/uL (ref 0.0–0.1)
Eosinophils Relative: 0 % (ref 0–5)
Lymphocytes Relative: 8 % — ABNORMAL LOW (ref 12–46)
Neutro Abs: 9.7 10*3/uL — ABNORMAL HIGH (ref 1.7–7.7)
Neutrophils Relative %: 86 % — ABNORMAL HIGH (ref 43–77)

## 2011-03-08 LAB — HEPATIC FUNCTION PANEL
ALT: 136 U/L — ABNORMAL HIGH (ref 0–53)
AST: 77 U/L — ABNORMAL HIGH (ref 0–37)
Albumin: 2.5 g/dL — ABNORMAL LOW (ref 3.5–5.2)
Alkaline Phosphatase: 77 U/L (ref 39–117)
Bilirubin, Direct: 0.1 mg/dL (ref 0.0–0.3)
Total Bilirubin: 0.3 mg/dL (ref 0.3–1.2)

## 2011-03-08 LAB — GLUCOSE, CAPILLARY
Glucose-Capillary: 113 mg/dL — ABNORMAL HIGH (ref 70–99)
Glucose-Capillary: 124 mg/dL — ABNORMAL HIGH (ref 70–99)
Glucose-Capillary: 128 mg/dL — ABNORMAL HIGH (ref 70–99)
Glucose-Capillary: 135 mg/dL — ABNORMAL HIGH (ref 70–99)

## 2011-03-08 LAB — URINALYSIS, ROUTINE W REFLEX MICROSCOPIC
Bilirubin Urine: NEGATIVE
Glucose, UA: NEGATIVE mg/dL
Hgb urine dipstick: NEGATIVE
Leukocytes, UA: NEGATIVE
Nitrite: NEGATIVE
Specific Gravity, Urine: 1.023 (ref 1.005–1.030)
Specific Gravity, Urine: 1.015 (ref 1.005–1.030)
Urobilinogen, UA: 1 mg/dL (ref 0.0–1.0)
pH: 5.5 (ref 5.0–8.0)

## 2011-03-08 LAB — ABO/RH: ABO/RH(D): A POS

## 2011-03-08 LAB — POCT CARDIAC MARKERS: Myoglobin, poc: 54 ng/mL (ref 12–200)

## 2011-03-08 LAB — CULTURE, ROUTINE-ABSCESS: Culture: NO GROWTH

## 2011-03-08 LAB — CREATININE, URINE, RANDOM: Creatinine, Urine: 147.1 mg/dL

## 2011-03-08 LAB — PROTIME-INR
INR: 1.12 (ref 0.00–1.49)
INR: 1.44 (ref 0.00–1.49)
Prothrombin Time: 17.7 seconds — ABNORMAL HIGH (ref 11.6–15.2)

## 2011-03-08 LAB — LIPASE, BLOOD: Lipase: 42 U/L (ref 23–300)

## 2011-03-08 LAB — URINE MICROSCOPIC-ADD ON

## 2011-03-08 LAB — APTT: aPTT: 30 seconds (ref 24–37)

## 2011-03-08 LAB — MRSA PCR SCREENING: MRSA by PCR: NEGATIVE

## 2011-03-08 LAB — TYPE AND SCREEN

## 2011-03-08 LAB — PROTEIN, URINE, RANDOM: Total Protein, Urine: 89 mg/dL

## 2011-03-08 LAB — BRAIN NATRIURETIC PEPTIDE: Pro B Natriuretic peptide (BNP): 185 pg/mL — ABNORMAL HIGH (ref 0.0–100.0)

## 2011-04-10 LAB — CULTURE, BLOOD (ROUTINE X 2)

## 2011-04-10 LAB — COMPREHENSIVE METABOLIC PANEL
BUN: 23 mg/dL (ref 6–23)
Calcium: 7.8 mg/dL — ABNORMAL LOW (ref 8.4–10.5)
Glucose, Bld: 109 mg/dL — ABNORMAL HIGH (ref 70–99)
Sodium: 133 mEq/L — ABNORMAL LOW (ref 135–145)
Total Protein: 5.1 g/dL — ABNORMAL LOW (ref 6.0–8.3)

## 2011-04-10 LAB — GLUCOSE, CAPILLARY
Glucose-Capillary: 124 mg/dL — ABNORMAL HIGH (ref 70–99)
Glucose-Capillary: 125 mg/dL — ABNORMAL HIGH (ref 70–99)

## 2011-04-10 LAB — URINE MICROSCOPIC-ADD ON

## 2011-04-10 LAB — DIFFERENTIAL
Basophils Absolute: 0.1 10*3/uL (ref 0.0–0.1)
Eosinophils Absolute: 0.1 10*3/uL (ref 0.0–0.7)
Eosinophils Relative: 1 % (ref 0–5)

## 2011-04-10 LAB — CK TOTAL AND CKMB (NOT AT ARMC): CK, MB: 2.1 ng/mL (ref 0.3–4.0)

## 2011-04-10 LAB — URINALYSIS, ROUTINE W REFLEX MICROSCOPIC
Bilirubin Urine: NEGATIVE
Hgb urine dipstick: NEGATIVE
Ketones, ur: NEGATIVE mg/dL
Leukocytes, UA: NEGATIVE
Nitrite: NEGATIVE
Urobilinogen, UA: 0.2 mg/dL (ref 0.0–1.0)
pH: 5.5 (ref 5.0–8.0)

## 2011-04-10 LAB — CBC
HCT: 35.5 % — ABNORMAL LOW (ref 39.0–52.0)
HCT: 40 % (ref 39.0–52.0)
Hemoglobin: 11.8 g/dL — ABNORMAL LOW (ref 13.0–17.0)
MCHC: 33.4 g/dL (ref 30.0–36.0)
MCHC: 33 g/dL (ref 30.0–36.0)
MCV: 98.1 fL (ref 78.0–100.0)
Platelets: 256 10*3/uL (ref 150–400)
RDW: 13.7 % (ref 11.5–15.5)
RDW: 12.7 % (ref 11.5–15.5)

## 2011-04-10 LAB — HEMOGLOBIN A1C
Hgb A1c MFr Bld: 5.4 % (ref 4.6–6.1)
Mean Plasma Glucose: 108 mg/dL

## 2011-04-10 LAB — LIPID PANEL
Cholesterol: 93 mg/dL (ref 0–200)
LDL Cholesterol: 52 mg/dL (ref 0–99)
Total CHOL/HDL Ratio: 3.7 RATIO

## 2011-04-10 LAB — BASIC METABOLIC PANEL
BUN: 21 mg/dL (ref 6–23)
Chloride: 102 mEq/L (ref 96–112)
Glucose, Bld: 114 mg/dL — ABNORMAL HIGH (ref 70–99)
Potassium: 4.6 mEq/L (ref 3.5–5.1)

## 2011-04-10 LAB — D-DIMER, QUANTITATIVE: D-Dimer, Quant: 1.95 ug/mL-FEU — ABNORMAL HIGH (ref 0.00–0.48)

## 2011-04-10 LAB — URINE CULTURE: Colony Count: NO GROWTH

## 2011-04-10 LAB — PROTIME-INR: INR: 1.2 (ref 0.00–1.49)

## 2011-04-10 LAB — OSMOLALITY, URINE: Osmolality, Ur: 448 mOsm/kg (ref 390–1090)

## 2011-04-20 ENCOUNTER — Encounter: Payer: Self-pay | Admitting: Internal Medicine

## 2011-04-21 ENCOUNTER — Encounter: Payer: Self-pay | Admitting: Internal Medicine

## 2011-04-21 ENCOUNTER — Ambulatory Visit (INDEPENDENT_AMBULATORY_CARE_PROVIDER_SITE_OTHER): Payer: Medicare Other | Admitting: Internal Medicine

## 2011-04-21 VITALS — BP 116/62 | HR 49 | Resp 18 | Ht 68.0 in | Wt 202.0 lb

## 2011-04-21 DIAGNOSIS — I359 Nonrheumatic aortic valve disorder, unspecified: Secondary | ICD-10-CM

## 2011-04-21 DIAGNOSIS — M129 Arthropathy, unspecified: Secondary | ICD-10-CM

## 2011-04-21 DIAGNOSIS — R5383 Other fatigue: Secondary | ICD-10-CM

## 2011-04-21 DIAGNOSIS — I4891 Unspecified atrial fibrillation: Secondary | ICD-10-CM

## 2011-04-21 DIAGNOSIS — I2510 Atherosclerotic heart disease of native coronary artery without angina pectoris: Secondary | ICD-10-CM

## 2011-04-21 DIAGNOSIS — I350 Nonrheumatic aortic (valve) stenosis: Secondary | ICD-10-CM

## 2011-04-21 NOTE — Assessment & Plan Note (Signed)
Asked him to stop meloxicam due to h/o renal failure.

## 2011-04-21 NOTE — Patient Instructions (Signed)
Stop Miloxicam Stop Metoprolol Your physician has requested that you have an echocardiogram. Echocardiography is a painless test that uses sound waves to create images of your heart. It provides your doctor with information about the size and shape of your heart and how well your heart's chambers and valves are working. This procedure takes approximately one hour. There are no restrictions for this procedure. Your physician recommends that you schedule a follow-up appointment in: 1 year

## 2011-04-21 NOTE — Progress Notes (Signed)
HPI:  [name] is an 77 year old man with HTN, DM, CRI, PAF and coronary artery disease who suffered a non-ST elevation myocardial infarction in July 2009.  He underwent a bare-metal stenting to a subtotally occluded diagonal branch.  Remainder of coronaries with mild nonobstructive disease.  He had normal LV function.  He also has a history of paroxysmal atrial fibrillation complicated by sick sinus syndrome.  Holter monitor showed bradycardia with sinus pauses up to 2.5 seconds, but nothing longer than that. Not on coumadin due to severe epistaxis requiring hospitalization. He also had an episode of acute renal failure and hyperkalemia and Dr. Excell Seltzer stopped his diuretics and diclofenac.  We have been following him up primarily for fatigue.  He had a CPX test back in November 2009 which showed a VO2 of 14.4 which was 75% predicted, slope was 35.  O2 pulse was normal.  When VO2 was corrected for his body weight, it was 17.6.  Echo 03/2010 EF 55-60% grade 1 diastolic dysfx. Mild AS. In November 2011 was admitted with severe sepsis due to abdomina abscess which required drainage. In January 2012 was admitted with TIA. MRI with multiple lacunar infarcts and small intracranial aneurysm. Kept on Plavix. Carotid u/s 0-39% bilaterally  Returns for yearly f/u.   Continues to feel very fatigued. Says it has been this way for long time. No CP or dyspnea. Says his fingers are swelling. No orthopnea or PND.     ROS: All systems negative except as listed in HPI, PMH and Problem List.  Past Medical History  Diagnosis Date  . CAD (coronary artery disease)     s/p NSTEMI and BMS stent diagonal07/09  . AS (aortic stenosis)     Echo 2009 mean gradient 9mm HG. AVA 2.18    Echo 4/11 mild AS mean gradient 10mm HG  . AF (paroxysmal atrial fibrillation)     Holter monitor 2.5 sec pauses  . Fatigue     CPX 11/09 VO2  14.4 (75% predicte)d, slope 35.  O2 pulse normal.  VO2 corrected for body weight 17.6.  Marland Kitchen  Hypothyroidism   . Hyperlipidemia   . History of prostate cancer   . Allergic rhinitis   . DM (diabetes mellitus)     type II diet controlled  . Shingles   . Left carotid stenosis   . Vitamin B 12 deficiency   . Anemia   . Osteoarthritis   . Nephrolithiasis   . Obesity   . OSA (obstructive sleep apnea)     AHI 15 PSG 09/06/08  . Hx of colonoscopy     Current Outpatient Prescriptions  Medication Sig Dispense Refill  . Ascorbic Acid (VITAMIN C) 500 MG tablet Take 500 mg by mouth daily.        . clopidogrel (PLAVIX) 75 MG tablet Take 75 mg by mouth daily.        . Cyanocobalamin (VITAMIN B-12 IJ) Inject as directed. Every 2 weeks       . cyclobenzaprine (FLEXERIL) 10 MG tablet Take 10 mg by mouth 3 (three) times daily as needed.        . furosemide (LASIX) 20 MG tablet Take 10 mg by mouth daily.        . hydrALAZINE (APRESOLINE) 25 MG tablet Take 25 mg by mouth 2 (two) times daily.        . hydrOXYzine (ATARAX) 10 MG tablet Take 10 mg by mouth 3 (three) times daily as needed.        Marland Kitchen  levothyroxine (SYNTHROID, LEVOTHROID) 175 MCG tablet Take 137 mcg by mouth daily.       . meloxicam (MOBIC) 7.5 MG tablet Take 7.5 mg by mouth daily.        . metoprolol tartrate (LOPRESSOR) 25 MG tablet Take 12.5 mg by mouth 2 (two) times daily.        . solifenacin (VESICARE) 5 MG tablet Take 5 mg by mouth daily.        . traMADol (ULTRAM) 50 MG tablet Take 50 mg by mouth daily as needed.       Marland Kitchen DISCONTD: ALPRAZolam (XANAX) 0.25 MG tablet as needed.        Marland Kitchen DISCONTD: aspirin 325 MG EC tablet Take 325 mg by mouth daily.        Marland Kitchen DISCONTD: diltiazem (CARDIZEM CD) 180 MG 24 hr capsule Take 180 mg by mouth daily.        Marland Kitchen DISCONTD: magnesium oxide (MAG-OX) 400 MG tablet 400 mg. 2 as needed at bedtime       . DISCONTD: predniSONE (DELTASONE) 5 MG tablet 5 mg. As directed       . DISCONTD: simvastatin (ZOCOR) 40 MG tablet Take 40 mg by mouth daily.           PHYSICAL EXAM: Filed Vitals:   04/21/11  1538  BP: 116/62  Pulse: 49  Resp: 18   Gen: elderly. Walks very slowly with limp due to hip and knee pain Sats 95% peak HR on walking hall 70bpm HEENT: normal Neck: supple. no JVD. Carotids 2+ bilat; + bilateral radiated  bruits. No lymphadenopathy or thryomegaly appreciated. Cor: PMI nondisplaced. Regular rate & rhythm. No rubs, gallops. 2/6 systolic murmur at RSB. Lungs: clear Abdomen: obese. soft, nontender, nondistended. Good bowel sounds. Extremities: no cyanosis, clubbing, rash, tr edema Neuro: alert & orientedx3, cranial nerves grossly intact. moves all 4 extremities w/o difficulty. affect pleasant   ECG: Sinus bradycardia 49 1avb ( ) QRS No ST-T wave abnormalities.     ASSESSMENT & PLAN:

## 2011-04-21 NOTE — Assessment & Plan Note (Signed)
This is a chronic problem for him and I suspect multifactorial. He is, however, quite bradycardic today and doe have evidence of conduction disease on ECG. We will go ahead and stop his metoprolol now. No indication for pacing at this time but may need in the future.

## 2011-04-21 NOTE — Assessment & Plan Note (Signed)
Paroxysmal. Now in sinus. Unable to tolerate coumadin due to bleeding. Continue ASA/plavix.

## 2011-04-21 NOTE — Assessment & Plan Note (Signed)
No evidence of ischemia. Continue current regimen.   

## 2011-04-21 NOTE — Assessment & Plan Note (Signed)
Mild. Due for repeat echo.  

## 2011-05-04 ENCOUNTER — Ambulatory Visit (HOSPITAL_COMMUNITY): Payer: Medicare Other | Attending: Internal Medicine | Admitting: Radiology

## 2011-05-04 DIAGNOSIS — I359 Nonrheumatic aortic valve disorder, unspecified: Secondary | ICD-10-CM

## 2011-05-04 DIAGNOSIS — I350 Nonrheumatic aortic (valve) stenosis: Secondary | ICD-10-CM

## 2011-05-09 NOTE — Cardiovascular Report (Signed)
NAMEGOPAL, [name] NO.:  [id number]   MEDICAL RECORD NO.:  [id number]          PATIENT TYPE:  INP   LOCATION:  6532                         FACILITY:  [facility]   PHYSICIAN:  Veverly Fells. Excell Seltzer, MD  DATE OF BIRTH:  07/02/1923   DATE OF PROCEDURE:  06/29/2008  DATE OF DISCHARGE:                            CARDIAC CATHETERIZATION   PROCEDURE:  Percutaneous transluminal coronary angioplasty and stenting  of the first diagonal branch of the left anterior descending artery.   INDICATIONS:  [name] is an 77 year old gentleman who presented with an  acute coronary syndrome.  He ruled in for a non-Q-wave myocardial  infarction.  He underwent diagnostic catheterization by Dr. Antoine Poche  this morning.  This demonstrated mild to moderate diffuse LAD and left  circumflex stenosis.  There is subtotal occlusion of the first diagonal  branch of the LAD, which filled with TIMI I to II flow.  This appeared  to be the patient's culprit vessel.  The patient has been treated with  heparin and Integrilin.  He has had ongoing chest pain.  After  discussion with Dr. Antoine Poche, we elected to intervene.  The 5-French  long sheath was changed out for a 6-French long sheath over a J-tipped  0.035 wire.  An XB 3.5 LAD guide catheter was used.  I initially  attempted to wire the diagonal with a cougar wire, but it would not  cross the total occlusion.  I changed out for a 3 grams [employer]  wire and was able to cross with that wire without too much difficulty.  The vessel was dilated with a 1.5 x 15-mm balloon, taken to 12  atmospheres.  This restored antegrade flow.  The wire was repositioned  into the main branch of the diagonal.  The vessel was redilated with a  2.0 x 15-mm Voyager balloon, taken to 10 atmospheres.  After examining  the lesion more closely, the vessel had an aneurysmal area just before  the severe stenosis and just after it.  I elected to treat it with a  bare metal  stent.  A 2.5 x 15-mm Vision stent was used.  This was  deployed at 14 atmospheres.  Following stenting, there was excellent  flow throughout the vessel.  The stent appeared to be underexpanded in  the midportion.  It was postdilated with 2.75 x 12-mm Voyager  noncompliant balloon, which was inflated on multiple inflations up to 18  atmospheres.  At the conclusion of the procedure, there was excellent  angiographic result with TIMI III flow in the vessel and a widely  expanded stent.   ASSESSMENT:  Successful stenting of the first diagonal branch to the  left anterior descending artery using a bare metal stent.   PLAN:  [name] should receive 30 days minimum of aspirin and Plavix.  He would benefit from 12 months of dual antiplatelet therapy since he is  ruled in for a non-ST-elevation MI.      Veverly Fells. Excell Seltzer, MD  Electronically Signed     MDC/MEDQ  D:  06/29/2008  T:  06/30/2008  Job:  [id number]

## 2011-05-09 NOTE — H&P (Signed)
NAME:  [name], [name] NO.:  [id number]   MEDICAL RECORD NO.:  [id number]          PATIENT TYPE:  INP   LOCATION:  1828                         FACILITY:  [facility]   PHYSICIAN:  Michiel Cowboy, MDDATE OF BIRTH:  1923/08/17   DATE OF ADMISSION:  12/24/2008  DATE OF DISCHARGE:                              HISTORY & PHYSICAL   PRIMARY CARE Krystiana Fornes:  Dr. Duane Lope with [facility].   CHIEF COMPLAINTS:  Headache.   HISTORY OF PRESENT ILLNESS:  The patient is an 77 year old gentleman  with past medical history consistent with COPD, hypercholesteremia,  coronary artery disease and diabetes, diet controlled.  The patient was  at his baseline health up until a few days ago when he started to feel  overall poorly; he could not quite describe it, just generalized  weakness.  Today he had some nausea, although no vomiting, just dry  heaves.  No chest pains.  He is always somewhat short of breath.  He  occasionally has wheezes, although not today.  He has occasional cough,  although not really worse today.  He had a persistent headache for the  past few days and presented to Urgent Care from where he was sent to the  emergency department.  In the ED he was noted to be slightly hypoxic  down to low 90s, high 80s on room air, at which point he was placed on 2  liters with some improvement and the [facility] hospitalist was called for  further admission.   REVIEW OF SYSTEMS:  No fevers.  There are occasional chills.  No  difficulty in urination.  No abdominal pain.  He has been constipated.  He has not been taking his medications as prescribed.  He does not take  any inhalers, although I do not think he is prescribed any either.  Otherwise, review of systems unremarkable, particularly no chest pain.  No lower extremity swelling.   PAST MEDICAL HISTORY:  Significant for:  1. History of coronary disease.  2. History of hypothyroidism.  3. Hypertension.  4. Cholesterolemia.  5.  COPD.  6. Diet controlled diabetes.   SOCIAL HISTORY:  The patient used to smoke, but said he quit, unsure of  when.  He drank alcohol up until a couple months ago.   FAMILY HISTORY:  Noncontributory.  Of note, recently his youngest  daughter was diagnosed with terminal cancer.   ALLERGIES:  NO KNOWN DRUG ALLERGIES.   MEDICATIONS:  1. Tramadol 50 mg as needed 4 times a day.  2. Levothyroxine 175 mcg once a day.  3. Simvastatin 40 mg once a day.  4. Magnesium oxide 400 mg p.o. 4 times a day.   VITALS:  Temperature 99.3, blood pressure 120/63, pulse 84, respirations  32, now down to 24, O2 saturation initially 91% on room air, but  saturating 94% on 2 liters.  The patient appears to be currently in no acute distress, resting  comfortably.  HEAD:  Nontraumatic with somewhat of a red face.  LUNGS:  Clear to auscultation bilaterally.  No wheezing could be  appreciated.  HEART:  Regular rate and rhythm.  No murmurs were appreciated, no rub.  ABDOMEN:  soft, nontender, nondistended.  LOWER EXTREMITIES: Without clubbing, cyanosis or edema.  NEUROLOGICAL:  Intact.  Strength 5/5 in all four extremities.  Cranial  is intact.   LABS:  White blood cell count 9.8, hemoglobin 13, sodium 133, potassium  4.5t.  Creatinine 1.95.  EKG showing normal sinus rhythm, rate 89.  Chest x-ray shows scarring and COPD, otherwise unremarkable.  Carboxyhemoglobin level 1.2 which is normal.  CT scan of the head  unremarkable.   ASSESSMENT:  This is an 77 year old gentleman with relative mild hypoxia  and renal insufficiency.  Wonder if this is acute renal failure.  We  will admit for further observation and an IV fluids, as well as  initiation of oxygen and treatment of chronic obstructive pulmonary  disease.   PLAN:  1. Chronic obstructive pulmonary disease  and hypoxia.  Given that per      studies, focus of chronic obstructive pulmonary disease      exacerbation on occasion and hypoxia__________, we  will obtain a D-      dimer.  At this point, hold off on CT scan as his creatinine is      elevated.  We will give IV fluids and see how he responds.  The      patient will likely need to be on home oxygen from now on, but may      improve with initiation of bronchodilators.  We will start him on      Advair and place him on Augmentin for mild chronic obstructive      pulmonary disease exacerbation.  At this point, we will not give      p.o. IV steroids as the patient is not actively wheezing, but we      will make sure he is on Atrovent and p.r.n. albuterol.  2. Possibly acute renal failure, since he has no history of renal      failure in the past.  We will see if he is responsive to IV fluids.      We will check a renal ultrasound and obtain urine electrolytes.  We      will do strict input and output and we will write for postvoid      residual.  3. Hypothyroidism.  We will check TSH given the patient has been      somewhat symptomatic and not taking his medication.  We will resume      Synthroid.  4. Diet controlled diabetes which showed hemoglobin A1c.  5. Overall not feeling well, mild nausea and malaise.  It is hard to      say at this point what would cause this, but we will make sure to      check UA.  Cycle cardiac enzymes as this patient could be diabetic      and have history of coronary artery disease.  6. History of hyperlipidemia.  Continue simvastatin.  7. Prophylaxis.  We will start Protonix plus Lovenox.      Michiel Cowboy, MD  Electronically Signed     AVD/MEDQ  D:  12/24/2008  T:  12/24/2008  Job:  [id number]   cc:   C. Duane Lope, M.D.

## 2011-05-09 NOTE — Cardiovascular Report (Signed)
NAMEPHILL, [name] NO.:  [id number]   MEDICAL RECORD NO.:  [id number]          PATIENT TYPE:  INP   LOCATION:  6532                         FACILITY:  [facility]   PHYSICIAN:  [name], MD, FACCDATE OF BIRTH:  [birth year]   DATE OF PROCEDURE:  06/29/2008  DATE OF DISCHARGE:                            CARDIAC CATHETERIZATION   PROCEDURE:  Left heart catheterization/coronary arteriography.   INDICATIONS:  The patient with chest pain and a non-Q-wave of myocardial  infarction.   PROCEDURE NOTE:  Left heart catheterization was performed via the right  femoral artery.  The artery was cannulated using anterior wall puncture.  A #6-French arterial sheath was inserted via the modified Seldinger  technique.  Preformed Judkins and pigtail catheter were utilized.  The  patient tolerated the procedure well.   RESULTS:  Hemodynamics:  LV 160/9 and AO 166/72.  Coronaries left main  was short and normal.  The LAD had proximal 25% stenosis.  There were  mild aneurysmal dilatations throughout.  There was a long proximal 25%  stenosis and mid long 25% stenosis.  There was a very large first  diagonal, which had subtotal stenosis proximally.  The circumflex in the  AV groove had long proximal 25% stenosis.  There was a 40% stenosis  after an obtuse marginal.  First obtuse marginal was small with ostial  25% stenosis.  The second obtuse marginal was large with ostial 40%  stenosis.  There was a large normal posterolateral.  The right coronary  artery was dominant vessel.  It had diffuse mild aneurysmal dilatation.  There was a long proximal mid 25% stenosis.  The PDA was large with  luminal irregularities.  Left ventriculogram:  The left ventriculogram was obtained in the RAO  projection.  The EF was 50% with mild anterior hypokinesis in the  distribution of the first diagonal.   CONCLUSION:  Severe single-vessel coronary artery disease with diffuse  nonobstructive plaque  elsewhere.  Mildly reduced ejection fraction.   PLAN:  The patient had percutaneous revascularization attempted by Dr.  Excell Seltzer for management of the infarct vessel, which is the first  diagonal.      [name], MD, [facility]  Electronically Signed     JH/MEDQ  D:  06/30/2008  T:  07/01/2008  Job:  [id number]

## 2011-05-09 NOTE — Discharge Summary (Signed)
NAMEISLAM, EICHINGER NO.:  [id number]   MEDICAL RECORD NO.:  [id number]          PATIENT TYPE:  INP   LOCATION:  4711                         FACILITY:  [facility]   PHYSICIAN:  Barbette Hair. Artist Pais, DO      DATE OF BIRTH:  May 19, 1923   DATE OF ADMISSION:  06/11/2008  DATE OF DISCHARGE:  06/12/2008                               DISCHARGE SUMMARY   DISCHARGE DIAGNOSES:  1. Chest pain, negative cardiac enzymes.  2. Bradycardia.  3. Left carotid stenosis.  4. B12 deficiency/pernicious anemia.  5. Hypothyroidism.  6. Diabetes, diet controlled.  7. Hyperlipidemia.   DISCHARGE MEDICATIONS:  1. Synthroid 75 mcg once daily.  2. Vitamin B12 1000 mcg weekly x3 doses, then monthly.   DISCHARGE INSTRUCTIONS:  The patient is to follow up with Dr. Scotty Court.  He is to contact his office within 1 week for followup appointment.   HOSPITAL COURSE:  The patient is an 77 year old white male who presented  with a left-sided chest pain that started on June 10, 2008 at a.m.  The  patient also complained of intermittent dizziness and weakness over the  last several weeks.  Chest pain was described as constant sensation  pressure with no radiation.  The patient's cardiac enzymes were negative  x3.  By the time of hospital discharge, he was chest pain-free.  The  patient did have significant bradycardia.  His EKG on admission revealed  heart rate in the 50s, but on telemetry it dropped into the  40s.  At  the time of discharge, we were awaiting cardiac consultation by Dr.  Gala Romney.  The patient may need a pacemaker.  Holter monitor and stress  test will be arranged by cardiology.   In regards to the patient's chronic fatigue and dizziness, his symptoms  may be attributable to bradycardia;however, he  was also found to have  significant B12 deficiency.  His B12 level was 64.  He likely has  pernicious anemia.  The patient was given his first dose of  cyanocobalamin 1000 mcg IM.  He will  likely need subsequent B12  injections weekly x4 and then monthly.   A 2D echocardiogram  revealed normal  left ventricular size and overall  systolic left ventricular systolic function  normal.  There was no left  ventricular regional wall motion abnormalities.  Aortic valve was mildly  calcified, findings were persistent with mild aortic valve stenosis.   CT of the chest : no evidence of pulmonary embolism.  Left basilar  subsegmental atelectasis.  Question 1.0 cm diameter left adrenal  adenoma.   Carotid Dopplers revealed no significant plaque visualized on  the right  . Mild left  thickening, 40%-60% ICA stenosis.   CONDITION ON DISCHARGE:  The patient was chest pain-free.  He was  awaiting evaluation by Dr. Gala Romney.  It was unlikely that the patient  will get pacemaker performed today.  Elective pacemaker placement may be  arranged on Monday.   Outpatient workup for adrenal adenoma can be performed.  The patient  was directed to discuss starting statin therapy for carotid disease  with  his  primary care physician.      Barbette Hair. Artist Pais, DO  Electronically Signed     RDY/MEDQ  D:  06/12/2008  T:  06/13/2008  Job:  [id number]   cc:   Ellin Saba., MD

## 2011-05-09 NOTE — Letter (Signed)
August 27, 2008    To Whom It May Concern   RE:  VASILIOS, OTTAWAY  MRN:  [id number]  /  DOB:  Nov 29, 1923   Mr. Doucette was identified by 24-hour Holter monitoring as having paroxysms  of atrial fibrillation, the burden of which we could not identify.  He  was also having episodes of lightheadedness and his atrial fibrillation  on the monitor was noted with post termination pauses of greater than 2  seconds.  There are 2 issues, one is does he need anticoagulation for  atrial fibrillation and the second is does he need pacing because of his  pausing.  A real time monitor will allow [place] to clarify both of these  issues.   I thank you in advance for your consideration of this very important  need for Mr. Almanza.    Sincerely,      Duke Salvia, MD, [facility]  Electronically Signed    SCK/MedQ  DD: 08/27/2008  DT: 08/28/2008  Job #: [id number]

## 2011-05-09 NOTE — Procedures (Signed)
NAME:  [name], [name] NO.:  [id number]   MEDICAL RECORD NO.:  [id number]          PATIENT TYPE:  OUT   LOCATION:  SLEEP CENTER                 FACILITY:  [facility]   PHYSICIAN:  Coralyn Helling, MD        DATE OF BIRTH:  1923/05/02   DATE OF STUDY:                            NOCTURNAL POLYSOMNOGRAM   REFERRING PHYSICIAN:   INDICATION FOR STUDY:  [name] is an 77 year old male, who has a  history of atrial flutter, hypothyroidism, coronary artery disease, and  hypertension.  He also has symptoms of sleep disruption with excessive  daytime sleepiness.  He is referred to the sleep lab for evaluation of  hypersomnia with obstructive sleep apnea.   MEDICATIONS:  Plavix, metoprolol, Maxzide, Simvastatin, Diclofenac, and  Xanax.  The patient took a Xanax at 9 p.m. on the night of study.   Height is 5 feet 8 inches, weight is 205 pounds, BMI is 31, neck size is  16.5 inches.   EPWORTH SLEEPINESS SCORE:  Eight.   SLEEP ARCHITECTURE:  Total recording time was 450 minutes.  Total sleep  time was 397 minutes.  Sleep efficiency was 88%.  Sleep latency was 5  minutes, which is reduced.  REM latency was 258 minutes, which is  prolonged.  The study was notable for lack of slow-wave sleep.  The  patient slept in both the supine and non-supine positions.   RESPIRATORY DATA:  The average respiratory rate was 16.  Moderate  snoring was noted by the technician.  The overall apnea/hypopnea index  was 14.5.  The events were exclusively obstructive in nature.  The  supine apnea/hypopnea index was 18.8.  The non-supine apnea/hypopnea  index was 8.4.  The REM apnea/hypopnea index was 16.  The non-REM  apnea/hypopnea index was 14.2.   CARDIAC DATA:  The average heart rate was 53, and the rhythm strips  showed a sinus rhythm with sinus bradycardia.   OXYGEN DATA:  The baseline oxygenation was 95%.  The oxygen saturation  nadir was 88%.  The patient spent a total of 1.5 minutes with an  oxygen  saturation less than 90%.   MOVEMENT-PARASOMNIA:  The periodic limb movement index was 31.  The  patient also complained of leg cramps.   IMPRESSION:  This patient shows evidence for moderate obstructive sleep  apnea as demonstrated by an apnea/hypopnea index of 15 and an oxygen  saturation nadir of 88%.   In addition to diet, exercise, and weight reduction, consideration  should be given to having the patient undergo additional therapy with  either CPAP, oral appliance, or surgical intervention.      Coralyn Helling, MD  Diplomat, American Board of Sleep Medicine  Electronically Signed     VS/MEDQ  D:  09/06/2008 11:40:17  T:  09/06/2008 19:23:13  Job:  [id number]

## 2011-05-09 NOTE — Letter (Signed)
August 27, 2008    Nicholes Mango, MD  [address], [address]  [place], [place] [address]   RE:  DEONTE, OTTING  MRN:  [id number]  /  DOB:  Dec 24, 1923   Dear Jesusita Oka,   It is a pleasure seeing Jahir Halt at your request because of his  abnormal Holter monitor and his fatigue.   As you know, he is an 77 year old gentleman who underwent intervention  with a bare-metal stent in July.  He has had problems with significant  fatigue since then.  Because of his abnormal electrocardiogram, we  obtained a Holter monitor, which was notable for 2 things, one was sinus  pauses occurring both in sinus rhythm as well as post termination and  the second was atrial fibrillation that terminated and that gave rise to  that pause.  He had not previously been known to have atrial  fibrillation.   His medications currently include simvastatin, Lopressor 25 b.i.d.,  triamterene and hydrochlorothiazide 75/50, Xanax, and Synthroid.   On examination today, his blood pressure is 110/76.  His lungs were  clear.  His neck veins were flat.  His heart sounds were regular.  The  abdomen was soft.  Extremities had trace edema.  He was in no acute  distress.   His Holter monitor was as notable above and I could not determine from  it the burden of his atrial fibrillation.   IMPRESSION:  1. Fatigue, question related to medications (see below).  2. Atrial fibrillation with post termination pauses, question atrial      fibrillation burden.  3. Sinus node dysfunction.  4. Ischemic heart disease with prior bare-metal stenting and      medication noncompliance with Plavix because of cost.  5. Fatigue, question related to his beta-blocker.  6. Dizzy spells, question orthostatic.   Dan, there are couple of issues.  Mr. Wease is having atrial fibrillation  as identified by his monitor.  We do not know how much it is.  He is not  on Coumadin.  I think it is important for [place] to try to sort this out,  so  I am going to request a CardioNet monitor for him, so we can measure his  atrial fibrillation burden.  He is also having dizzy spells.  He is also  having dizzy spells, which may be related to the sinus node events or by  history more likely to orthostasis.  I think it is important however to  try to clarify that they are not arrhythmic.  This can also be done at  the same time of the CardioNet monitor.  As it relates with fatigue, I  have asked him to discontinue his Lopressor since he identifies the date  of onset of this fatigue as when he was in the hospital and when he  received his beta-blocker.  I have given a prescription for Inderal LA  60, metoprolol succinate 25, and atenolol 25.  He is to take them in  random order.  I will plan to see him again in 5 weeks' time to review  his event recorder as well as see how he is doing on his beta-blockers  and follow up with you thereafter.   We will plan to check a BMET today also because last time his creatinine  was 1.6 and also CBC as his last hemoglobin was 11.    Sincerely,      Duke Salvia, MD, [facility]  Electronically Signed  SCK/MedQ  DD: 08/27/2008  DT: 08/28/2008  Job #: [id number]   CC:    Ellin Saba., MD

## 2011-05-09 NOTE — Assessment & Plan Note (Signed)
[facility]                            CARDIOLOGY OFFICE NOTE   NAME:[name], Don DOLINGER                       MRN:          [id number]  DATE:07/16/2008                            DOB:          1923/09/24    PRIMARY CARE PHYSICIAN:  Barbette Hair. Artist Pais, DO.   INTERVAL HISTORY:  Mr. Braver is a very pleasant 77 year old male who  presents today with his daughter for followup.   I first saw him in consultation in the middle of June when he came in  with chest pain and bradycardia.  His heart rates were initially in the  40s, but then came up to the 50s and 60s.  I was quite concerned over a  possible coronary artery disease, and actually set him up for an  outpatient catheterization.  However, he cancelled this as he said his  chest pain was likely due to shingles, which he developed the next day  after his hospitalization.   Unfortunately, he was readmitted in early July with non-ST-elevation  myocardial infarction.  Cardiac catheterization showed a normal left  main, LAD had multiple 25% stenoses.  The first diagonal was a large  vessel, which was subtotally occluded.  There was nonobstructive disease  in the left circumflex and right coronary, EF was 50%.  He underwent  angioplasty and stenting of the diagonal branch by Dr. Excell Seltzer with a  bare-metal stent.  During the hospitalization, he apparently had  episodes of rapid atrial fibrillation.   Unfortunately, his postcatheterization course was a bit complicated due  to right groin bleed.  He saw Dr. Graciela Husbands on July 07, 2008, with  significant lower extremity edema bilaterally, it was 2-3 on the right  and 2+ on the left.  There was significant ecchymosis.  He underwent  scanning of his right upper leg, which showed no venous or arterial  problems.  He was started on Lasix, which has been titrated up by his  primary care physician.  He returns today for routine followup.  He says  he just feels terrible.  He has  markedly fatigued and has absolutely no  energy.  He denies any chest pain or shortness of breath.  He does feel  weak when he stands up.  He also notes that he snores quite heavily.   CURRENT MEDICATIONS:  1. Plavix 75 a day.  2. Synthroid 100 mcg a day.  3. Lopressor 25 b.i.d.  4. Simvastatin 40 a day.  5. Lasix 80 b.i.d.  6. Diclofenac 75 b.i.d.  7. Multivitamin.   PHYSICAL EXAMINATION:  GENERAL:  He is an elderly male in no acute  distress, ambulatory on the clinic slowly without any respiratory  difficulty.  VITAL SIGNS:  Blood pressure is 114/60 on sitting and it goes down to  98/58 on standing, heart rate is 58, his weight is 211, which is down  from previous weight of 216 when he saw Dr. Graciela Husbands.  HEENT:  Normal.  NECK:  Supple.  There is no JVD.  Carotids are 2+ bilaterally without  bruits.  There is no lymphadenopathy or thyromegaly.  CARDIAC:  He is  bradycardic and regular.  No obvious murmurs.  PMI is nondisplaced.  LUNGS:  Clear.  ABDOMEN:  Obese, nontender, and nondistended.  No hepatosplenomegaly.  No bruits.  No masses.  EXTREMITIES:  Warm with no cyanosis or clubbing.  He has 2+ edema on the  right leg from the calf down.  There is no edema on the left at all.  He  has got good pulses.  There are no cords.  NEURO:  Alert and oriented x3.  Cranial nerves II through XII are  intact.  Moves all 4 extremities without difficulty.  Affect is  pleasant.   EKG shows sinus bradycardia at a rate of 58 with a first-degree AV  block.   ASSESSMENT AND PLAN:  1. Coronary artery disease.  This is stable.  No evidence of ischemia.      Continue current therapy.  2. Extreme fatigue.  I suspect this is multifactorial.  He is mildly      orthostatic and I am worried that he is probably volume depleted.      We will go ahead and stop his Lasix for now and also check some      labs including a BMET, CBC, and TSH.  I also suspect he may have a      component of sleep apnea.  We  will send him for a sleep study.  3. Lower extremity swelling.  This is unilateral.  We sent him for      lower extremity ultrasound, which shows no evidence of DVT.  We      will put a compression stocking on him.  4. Atrial fibrillation with intermittent bradycardia consistent with      tachybrady syndrome.  We will put a 40-hour Holter on him to see      what he is doing.  I suspect at some point he may need a pacemaker.      He will obviously likely need Coumadin, but given the swelling of      his leg and recent hematoma I am reluctant to start that at this      point.   DISPOSITION:  We will await for his blood work to come back as well his  monitor.  We will see him back in the very near future.  I have referred  him to cardiac rehab for his post MI care.   Time spent with the patient's chart and studies approximately 1 hour.     [name], MD  Electronically Signed    DRB/MedQ  DD: 07/16/2008  DT: 07/17/2008  Job #: [id number]   cc:   Barbette Hair. Artist Pais, DO

## 2011-05-09 NOTE — Assessment & Plan Note (Signed)
[facility]                            CARDIOLOGY OFFICE NOTE   NAME:[name], [name]                       MRN:          [id number]  DATE:08/28/2008                            DOB:          1923-07-12    REASON FOR VISIT:  Hyperkalemia.   HISTORY OF PRESENT ILLNESS:  [name] is an 77 year old gentleman with  coronary artery disease who presented with a non-ST-elevation MI back in  July 2009, and underwent bare metal stent placement in subtotal occluded  diagonal branch.  He was seen in followup by Dr. Gala Romney and  complained of fatigue and weakness.  He was felt to be volume depleted  and his Lasix was discontinued.  He followed up with Dr. Graciela Husbands yesterday  for evaluation of bradycardia and paroxysmal atrial fib.  He had lab  work this morning that showed a potassium of 6.1.  We brought him in for  an acute visit to look over his medicines to determine the etiology of  his hyperkalemia.   The patient continues to feel poorly.  His energy level is better than  it previously has been.  He still complains of generalized fatigue.  He  denies chest pain or dyspnea.  He has no orthopnea, PND, or edema.  He  reports normal urination without hesitancy or urgency.  He reports no  subjective decrease in his urine output.  His urine color has been  normal as well.   MEDICATIONS:  1. Simvastatin 40 mg daily.  2. Diclofenac 75 mg twice daily.  3. Tandem b.i.d.  4. Lopressor 25 mg 1/2 b.i.d.  5. Triamterene and hydrochlorothiazide 75/50 mg every morning.  6. Alprazolam 0.25 mg q.i.d.  7. Synthroid 125 mcg daily.   PHYSICAL EXAMINATION:  GENERAL:  He is alert and oriented.  He is an  elderly male, in no acute distress.  VITAL SIGNS:  Blood pressure is 140/60, heart rates 52, respiratory  rates 16.  HEENT:  Normal.  NECK:  Normal carotid upstrokes.  No bruits.  JVP normal.  LUNGS:  Clear bilaterally.  HEART:  Bradycardic and regular.  No murmurs or  gallops.  ABDOMEN:  Soft, obese and nontender.  No organomegaly.  EXTREMITIES:  No clubbing, cyanosis or edema.  SKIN:  The arms are diffusely erythematous.  The patient reports he has  a sun reaction with his medications.  He has been out on sun a lot.  NEUROLOGIC:  Cranial nerves II-XII are intact.  Strength intact and  equal bilaterally.   LABORATORY DATA:  Hemoglobin 13.3, hematocrit 39.5, and platelets  275,000.  Sodium 142, potassium 6.1, BUN 47, and creatinine 2.8.  EKG  shows sinus bradycardia and is otherwise within normal limits.  The T-  waves are normal and there are no EKG changes suggestive of  hyperkalemia.   ASSESSMENT:  1. Hyperkalemia in the setting of nonoliguric, acute on chronic renal      insufficiency.  I reviewed Mr. Pascarella's creatinine values.  His      catheterization and percutaneous coronary intervention were      performed on June 29, 2008.  On July 07, 2008, his creatinine was      1.6 and now it is up to 2.8.  Contrast nephropathy would be      expected to have an earlier peak and I do not think this is related      to contrast.  Clinically, he does not appear volume depleted at      this point.  He is on a nonsteroidal that could be contributing      nephrotoxicity.  I have discontinued his diclofenac as well as his      triamterene and hydrochlorothiazide.  These will be on hold      immediately.  We are going to give him 500 mL of half normal saline      in the office today.  He eats a lot of tomatoes and bananas and we      have asked him to stop these foods and given him further      instructions on a low potassium diet.  He will come back on Tuesday      for a followup metabolic panel to reassess both his creatinine as      well as his potassium.  2. Coronary artery disease, status post percutaneous coronary      intervention.  Continue current therapy.  The patient should at      least be on aspirin.  He discontinued this at some point because of       nose bleeds.   For further followup, the patient will see Dr. Gala Romney.     Veverly Fells. Excell Seltzer, MD  Electronically Signed    MDC/MedQ  DD: 08/28/2008  DT: 08/29/2008  Job #: [id number]   cc:   [name], MD  Duke Salvia, MD, [facility]

## 2011-05-09 NOTE — Assessment & Plan Note (Signed)
[facility]                         ELECTROPHYSIOLOGY OFFICE NOTE   NAME:[name], Don PREWITT                       MRN:          [id number]  DATE:10/14/2008                            DOB:          Jun 23, 1923    [name] is seen in followup for atrial fibrillation with some brady  arrhythmia noted by Holter monitoring and pauses up to 2.5 seconds.  We  undertook a life watch monitor, which was unfortunately associated with  poor data set, collecting data on only 3 of the 21 days.  One of these,  however, was notable for rapid atrial fibrillation.   Intercurrently, he also had been exposed to a drug that was associated  with photosensitivity resulting in exfoliation of his skin and color  desquamation of his upper extremity that were exposed to sun.  It is not  clear to be what should medication this was.   Intercurrently, his diclofenac and hydrochlorothiazide/triamterene have  been held.  He has been started on levothyroxine at an increased dose  and he was also started on Qualaquin which was quinine, but this was  after the above-aforementioned event.   He continues to complain of fatigue.   ON EXAMINATION:  His blood pressure 110/68, his pulse was 60.  LUNGS:  Clear.  HEART:  Sounds were regular.  SKIN:  Desquamation was healing.  EXTREMITIES:  No edema.   IMPRESSION:  1. Atrial fibrillation with rapid ventricular response - paroxysmal.  2. Renal insufficiency.  3. Fatigue.  4. Prior exposure to beta-blockers, he is currently taking metoprolol      succinate b.i.d.  5. Difficult psychosocial situation with his wife with dementia.   [name] is doing relatively well.  His event recorder showed rapid  atrial fibrillation, but no significant pauses.  However, as noted there  was unfortunately large loss of data from the 21 days.   We will plan to see him again at Dr. Prescott Gum request and we asked  him to call us if there is anything we can  do in the interim.    Duke Salvia, MD, [facility]  Electronically Signed   SCK/MedQ  DD: 10/14/2008  DT: 10/15/2008  Job #: [id number]   cc:   Ellin Saba., MD

## 2011-05-09 NOTE — Discharge Summary (Signed)
Don, [name] NO.:  [id number]   MEDICAL RECORD NO.:  [id number]          PATIENT TYPE:  INP   LOCATION:  6532                         FACILITY:  [facility]   PHYSICIAN:  Don Fells. Excell [name], [name]  DATE OF BIRTH:  1923/09/08   DATE OF ADMISSION:  06/25/2008  DATE OF DISCHARGE:  06/30/2008                               DISCHARGE SUMMARY   PRIMARY CARDIOLOGIST:  Don Buckles. [name], [name].   PRIMARY CARE [name]:  [name], [name].   DISCHARGE DIAGNOSIS:  Non-ST-segment elevation, myocardial infarction.   SECONDARY DIAGNOSES:  1. Coronary artery disease status post successful percutaneous      coronary intervention and stenting of the first diagonal branch of      the left anterior descending with placement of a 2.5 x 15-mm      MiniVision bare-metal stent.  2. Paroxysmal atrial fibrillation, new diagnosis this admission.  3. Right groin hematoma, post catheterization.  4. Hypothyroidism.  5. Diet-controlled borderline diabetes mellitus.  6. History of sinus bradycardia.  7. Osteoporosis.  8. History of prostate cancer.  9. Hyperlipidemia.  10.Anemia.  11.B12 deficiency.  12.History of shingles.   ALLERGIES:  TETANUS TOXOID.   PROCEDURES:  1. Left heart cardiac catheterization with successful PCI and stenting      the diagonal branch of the LAD.  2. Placement of 2.5 x 15-mm MiniVision bare-metal stent.   HISTORY OF PRESENT ILLNESS:  An 77 year old obese Caucasian male with  prior history of chest pain, recently hospitalized on June 11, 2008.  During that admission, it was recommended that the patient is scheduled  for outpatient cardiac catheterization.  The patient subsequently  cancelled that appointment, but unfortunately continued to have  intermittent exertional chest discomfort prompting him to be seen by Dr.  Scotty [name] on June 25, 2008.  EMS was activated and the patient was taken  to [facility] Room for further evaluation.  In the  emergency  room, his first set of cardiac markers were negative and ECG showed no  acute changes.  He was admitted for further evaluation.   HOSPITAL COURSE:  The patient was in for a non-ST-segment elevation  myocardial infarction, eventually peaking his CK at 58, MB at 8.2, and  troponin I at 1.09.  Arrangements were made for the left heart cardiac  catheterization.  Prior to catheterization, the patient had recurrent  episode of chest pain in the setting of atrial fibrillation with rapid  ventricular response with rates in the 110s.  This was treated with  intravenous Lopressor, and he subsequently converted to sinus rhythm.   The patient underwent left heart cardiac catheterization on June 29, 2008, revealing a subtotal occlusion of the diagonal branch of the LAD.  The patient otherwise had nonobstructive disease.  The diagonal branch  was successfully stented with a 2.5 x 15-mm MiniVision bare-metal stent.  The patient tolerated the procedure well; however, post procedure  following his sheath pull, he developed bleeding and hematoma at the  right groin arterial stick site.  Manual pressure was maintained and  FemoStop was subsequently applied  with hemostasis achieved.  He did have  a moderate-sized hematoma with stable pulses and no significant  hemodynamic compromise.  This morning his H&H was down some to 8.9 and  26.5 respectively.  A followup  H&H this afternoon has shown improvement  to 10.2 and 30.0.  [name] has no additional chest pain, will be  discharged home today in good condition.   Of note, as stated above, the patient did have an episode of paroxysmal  atrial fibrillation.  In the setting of aspirin and Plavix therapy along  with some acute blood loss anemia following his PCI, we do not initiate  Coumadin at this time with decision further to the outpatient setting  and he will follow up with Dr. Gala [name] in approximately 3 weeks.   DISCHARGE LABS:  Hemoglobin  10.2, hematocrit 30.2, WBC 9.7, platelets  299.  Sodium 140, potassium 4.2, chloride 107, CO2 26, BUN 17,  creatinine 1.36, glucose 149, calcium 8.0, CK 38, MB 4.6, troponin I  0.73.  Total cholesterol 112, triglycerides 75, HDL 32, and LDL 65.  TSH  6.796.   DISPOSITION:  The patient is being discharged home today in good  condition.   FOLLOWUP APPOINTMENTS:  He has followup with Dr. Gala [name] on July 21, 2008, at 9:00 a.m.  He is to followup with Dr. Scotty [name] as previously  scheduled.   DISCHARGE MEDICATIONS:  1. Aspirin 325 mg daily.  2. Plavix 75 mg daily.  3. Synthroid 100 mcg daily (increased at this admission secondary to      elevated TSH).  4. Lopressor 25 mg b.i.d.  5. Simvastatin 40 mg nightly.  6. Diclofenac 75 mg b.i.d. with meals.  7. Acyclovir as previously prescribed.  8. Nitroglycerin 0.4 mg sublingual p.r.n. chest pain.   OUTSTANDING LAB STUDIES:  None.   DURATION OF DISCHARGE/ENCOUNTER:  65 minutes including physician time.      [name], [name]      Don Fells. Excell [name], [name]  Electronically Signed    CB/MEDQ  D:  06/30/2008  T:  07/01/2008  Job:  [id number]   cc:   [name]., [name]

## 2011-05-09 NOTE — Assessment & Plan Note (Signed)
[facility]                         ELECTROPHYSIOLOGY OFFICE NOTE   NAME:[name], [name]                       MRN:          [id number]  DATE:07/07/2008                            DOB:          [birth year]    [name] was seen today as an add-on because of swelling in his right  leg.  He was hospitalized over the Fourth of July weekend when he  presented with a non-Q-wave myocardial infarction, underwent  catheterization that demonstrated subtotal occlusion of his diagonal and  underwent bare metal stenting by Dr. Excell Seltzer.  When they went for a  sheath pull, he ended up with bleeding and a hematoma at the right  arterial stick site.  Manual pressure was applied and FemoStop was  utilized.  A moderate-sized hematoma developed.  No hemodynamic  compromise was appreciated.  Serial hemoglobins had a nadir of 8.9 with  equilibration of 10.2 and the patient was discharged.   He comes in today because of progressive bilateral lower extremity  swelling, right much greater than left.  There is also discoloration in  his right thigh.   He is having some concomitant shortness of breath.   I should also note that while he was in hospital he had paroxysms of  atrial fibrillation with a rapid ventricular response but apparently was  discharged in sinus rhythm (see below).   Medications today include:  1. Plavix 75.  2. Synthroid 100 mcg.  3. Lopressor 25 b.i.d.  4. Simvastatin.   EXAMINATION:  His blood pressure is 126/70, his pulse was 56, but that  was palpated.  I do not recall his apical pulse.  LUNGS:  Clear.  Heart sounds were irregular.  EXTREMITIES:  2-3+ edema on the right up to the level of the thigh with  discoloration and obvious ecchymosis.  The left leg had 2+ peripheral  edema.   He underwent duplex scanning demonstrating at least in the proximal  large veins of the right leg no obstruction.  There is also no evidence  of a pseudoaneurysm  or AV fistula.   IMPRESSION:  1. Right lower extremity hematoma with a pre-discharge hemoglobin of      10 or so.  2. Bilateral lower extremity peripheral edema, pitting.  3. Status post percutaneous coronary intervention of the diagonal with      normal left ventricular function, left ventricular end-diastolic      pressure at the time of the procedure was 9.  4. No evidence of deep vein thrombosis or mass obstruction in the      groin although there is a comment about a 1.6 by 1.1-cm mass in the      right groin extending to the upper thigh.   We will plan to check [name]'s hemoglobin today.  Based on the duplex,  I do not think there is any emergent vascular issue.  We will see him  again next week and he is to contact us again if there are any problems  in the interim.     Duke Salvia, MD, [facility]  Electronically Signed  SCK/MedQ  DD: 07/07/2008  DT: 07/07/2008  Job #: [id number]

## 2011-05-09 NOTE — Discharge Summary (Signed)
[name], [name] NO.:  [id number]   MEDICAL RECORD NO.:  [id number]          PATIENT TYPE:  INP   LOCATION:  4705                         FACILITY:  [facility]   PHYSICIAN:  Michiel Cowboy, MDDATE OF BIRTH:  03-Jul-1923   DATE OF ADMISSION:  12/24/2008  DATE OF DISCHARGE:  12/25/2008                               DISCHARGE SUMMARY   PRIMARY CARE Murel Wigle:  Miguel Aschoff, MD   CARDIOLOGIST:  [name], MD   DISCHARGE DIAGNOSES:  1. Mild chronic obstructive pulmonary disease exacerbation and      hypoxia.  2. History of paroxysmal atrial fibrillation, asymptomatic.  3. Coronary artery disease status post non-STEMI, status post bare-      metal stent in July 2009.  4. Hypothyroidism.  5. Hypertension.  6. Cholesteremia.  7. Diet-controlled diabetes.  8. Chronic renal insufficiency, baseline creatinine 1.5.   HOSPITAL COURSE:  The patient is an 77 year old old gentleman who came  in with severe headache, was noted to be hypoxic.  No wheezing was  noted, but he had some increased cough.  The patient was given nebulizer  treatment, started on oxygen, arranged for home oxygen, and his headache  resolved.  He was found to be feeling much better.  Initially, his  creatinine was noted to be slightly elevated to 0.2.  His baseline  creatinine was around 1.5.  He was given gentle IV hydration with  improvement.   Concerning his shortness of breath that is thought to be secondary to  COPD, his cr was elevated, but his V/Q scan was unremarkable.   Diabetes.  Hemoglobin is 5.4, and he does  require any discharge  medication.   Atrial fibrillation.  The patient was in sinus rhythm however until  discharge when he developed atrial fibrillation and heart rate between  90s-100s.  We discussed with the [facility] PA who apparently  reviewed the records and noted that the patient has sick sinus syndrome  with episodes of pauses and bradycardia probably  secondary to he is not  on beta-blockers.  He is also not a Coumadin candidate.  He has a  history of paroxysmal atrial fibrillation in the past.  The patient was  asymptomatic which shows that at this point, it is better not to  initiate any new medications but to have the patient follow up as an  outpatient.  He likely goes in and out of  afib constantly not  symptomatic with this.   Hyponatremia.  Initial sodium was 131 at the time discharge and up to  133.  His thought he was doing better.  Cardiac enzymes were  unremarkable during this admission.   DISCHARGE MEDICATIONS:  New medications include:  1. Atrovent inhaler q.8 h.  2. Augmentin 500 mg p.o. b.i.d., dispense for 5 days.  3. Albuterol inhaler as needed, chose not to give him Advair as the      patient has paroxysmal atrial fibrillation, to avoid overactive      beta stimulation.  4. The patient will continue levothyroxine 175 mcg daily.  The patient  to make sure to be taking this.  The patient to his TSH check at      the next hospital visit with his primary care Jaimeson Gopal.  The      patient to continue his tramadol as needed for pain and simvastatin      40 mg daily as well as Maxzide __________ mg p.o. at night.  The      patient was written a prescription for pulmonary rehab and 2 L of      home O2 was arranged for him.  The patient discharged to home.      Followup with Dr. Tenny Craw and Dr. Gala Romney in the next few weeks.      Michiel Cowboy, MD  Electronically Signed     AVD/MEDQ  D:  12/25/2008  T:  12/26/2008  Job:  [id number]   cc:   Miguel Aschoff, M.D.  [name], MD

## 2011-05-09 NOTE — Consult Note (Signed)
NAMEMIRL, HILLERY NO.:  [id number]   MEDICAL RECORD NO.:  [id number]          PATIENT TYPE:  INP   LOCATION:  4711                         FACILITY:  [facility]   PHYSICIAN:  [name], MDDATE OF BIRTH:  31-Jan-1923   DATE OF CONSULTATION:  06/12/2008  DATE OF DISCHARGE:                                 CONSULTATION   REQUESTING PHYSICIAN:  Dr. Thomos Lemons.   PRIMARY CARE PHYSICIAN:  Tawny Asal, MD.   REASON FOR CONSULTATION:  Chest pain and bradycardia.   HISTORY OF PRESENT ILLNESS:  Mr. Anastasi is a delightful 77 year old male  with a history of borderline diabetes, obesity, hyperlipidemia, and  hypothyroidism.  He denies any history of known coronary artery disease.  He did have a stress test done many years ago which was negative.   He was admitted to the hospital yesterday with chest pain.  He says for  the past 6 months, he has noticed a progressive fatigue and exercise  intolerance.  He does fell like as if he was slowing down.  He works  hard in trying to care for his wife who has advanced Alzheimer's  disease.  Yesterday, he was at rest when he developed a left-sided chest  pressure.  He went to the fire station and they brought him to the  hospital.  They gave him one nitroglycerin and it resolved.  He has not  had further problems.   While in the hospital, he had had an echocardiogram which showed a  normal ejection fraction.  There was very mild aortic stenosis with a  mean gradient of 9 mmHg and an valve area of 2.18 sq. cm.  He also has  been noted to be significantly bradycardic on telemetry.  I have seen  him running his heart rates from 47 up to about 65.  His nurse said he  actually down and rode around 40.  This was nonsustained.  At home, he  notes that occasionally he is lightheaded when he stands up too quickly  or tries to do something too fast, but he has not had syncope.  EKG  showed sinus bradycardia with a incomplete  left bundle-branch block.  His cardiac markers have been normal.   Review of systems is notable for arthritis and fatigue.  He denies any  fevers, chills, nausea, or vomiting.  He is not having bright red blood  per rectum or melena.  The remainder of the review of systems is as per  HPI and problem list otherwise all symptoms negative.   PAST MEDICAL HISTORY:  1. Diet-controlled diabetes.  2. Hyperlipidemia.  3. Hypothyroidism.  4. Osteoarthritis.  5. Anemia.  6. Allergic rhinitis.  7. History of prostate cancer.  8. B12 deficiency.   PAST SURGICAL HISTORY:  Notable for right knee replacement,  appendectomy, hernia repair, lumbar surgery, and a prostatectomy.   CURRENT MEDICATIONS:  1. Aspirin 325.  2. B12 injections.  3. Voltaren 75 b.i.d.  4. Synthroid 75 mcg per day.   SOCIAL HISTORY:  He lives in [place] with his wife who was Alzheimer's  disease.  He sees his primary caregiver.  He is [employer], clinical and at  age 55 he was on the beaches of [place] during [place].  He has a history  of tobacco, but quit 50 years ago.  He used to work at [employer], denies  any alcohol.   FAMILY HISTORY:  Mother died at 78 due to stroke.  Father died at 36.  He has a history of coronary artery disease and alcohol.  His brother  had significant coronary artery disease and multiple heart surgeries.   PHYSICAL EXAMINATION:  GENERAL:  He is an elderly male who is hard of  hearing.  He is lying in bed.  No acute distress.  VITALS:  Blood pressure 147/67, heart rate 50, and he is sating 94% on  room air.  HEENT:  Normal.  NECK:  Supple.  There is no JVD.  Carotids are 2+ bilaterally with  bilateral soft bruits.  There is no lymphadenopathy or thyromegaly.  CARDIAC:  PMI is nondisplaced.  He has distant heart sounds.  He has a  regular, rate, and rhythm.  He is bradycardic and regular with a soft  2/6 systolic ejection murmur at the right sternal border.  S2 is well-  preserved.  LUNGS:   Clear.  ABDOMEN:  Obese, nontender, and nondistended.  There is no  hepatosplenomegaly, no bruits, and no masses.  Good bowel sounds.  EXTREMITIES:  Warm with no cyanosis or clubbing.  There is trace edema  bilaterally.  NEURO:  He is alert and oriented x3.  Cranial nerves II through are XII  are intact except for he is mildly hard of hearing.   LABORATORY DATA:  Notable for a white count of 6.0, hemoglobin 11.3, and  platelet 213.  Sodium 138, potassium 4.3, BUN 22, creatinine 1.27, and  glucose 97.  Troponins were 0.04, 0.06, and 0.05.  His LDL is 84.  TSH  is just mildly elevated at 6.086.   LABORATORY DATA:  Head CT, no acute process.  Chest CT showed no  evidence of PE.  A small left adrenal adenoma.   EKG shows sinus bradycardia at rate of 46 with a first-degree A-V block  with a PR interval of 216 milliseconds and a QRS duration of 114  milliseconds with a nonspecific IVCD .  There is no ST-T wave  abnormalities.   ASSESSMENT/PLAN:  1. Progressive exercise intolerance.  2. Chest pain.  3. Sinus bradycardia with mildly prolonged QRS.  4. Mild hypothyroidism.  5. Very mild aortic stenosis.   I disagree with the primary team that Mr. Stayer's bradycardia may be the  predominant source of his exertional fatigue.  I suspect at some point  he may need a pacemaker in light of his underlying conduction disease.  However, I am also significantly concerned about the underlying coronary  artery disease.  Thus at this point, I think the best plan is to proceed  with a cardiac catheterization.  We will do this as an outpatient next  week.  We will also put a 48-  hour Holter monitor on him to further evaluate his bradycardia and have  him follow up with EP.  We have given him a prescription for sublingual  nitroglycerin and told him that should he have a severe episode he needs  to come back to the hospital for more inpatient workup.      [name], MD  Electronically  Signed     DRB/MEDQ  D:  06/12/2008  T:  06/13/2008  Job:  [id number]   cc:   Barbette Hair. Artist Pais, DO  Ellin Saba., MD

## 2011-05-09 NOTE — Assessment & Plan Note (Signed)
[facility]                            CARDIOLOGY OFFICE NOTE   NAME:[name], Don LONGTON                       MRN:          [id number]  DATE:12/31/2008                            DOB:          [name]    PRIMARY CARE PHYSICIAN:  C. Duane Lope, MD   INTERVAL HISTORY:  [name] is an 77 year old man with coronary artery  disease who suffered a non-ST elevation myocardial infarction in July  2009.  He underwent a bare-metal stenting to a subtotally occluded  diagonal branch.  Remainder of his coronary artery showed mild  nonobstructive disease.  He had normal LV function.  He also has a  history of paroxysmal atrial fibrillation complicated by sick sinus  syndrome.  Holter monitor showed bradycardia with sinus pauses up to 2.5  seconds, but nothing longer than that.  He also had an episode of acute  renal failure and hyperkalemia and Dr. Excell Seltzer stopped his diuretics and  diclofenac.   We have been following him up primarily for fatigue.  He had a CPX test  back in November which showed a VO2 of 14.4 which was 75% predicted,  slope was 35.  O2 pulse was normal.  When VO2 was corrected for his body  weight, it was 17.6.   He returns today with his son.  He continues to be quite fatigued.  He  denies any chest pain.  He had some dyspnea, but no orthopnea, no PND,  no lower extremity edema.  He was recently in the hospital after feeling  quite lightheaded and had a COPD flare, he was just discharged a couple  of days ago.   CURRENT MEDICATIONS:  1. Synthroid 175 mcg a day.  2. Aspirin 81 a day.  3. Oxygen.  4. Propranolol 60 a day.  5. Atrovent.  6. Augmentin.  7. Simvastatin 40 a day.   PHYSICAL EXAMINATION:  GENERAL:  He is an elderly male in no acute  distress.  Ambulates around the clinic slowly without any respiratory  difficulty.  VITAL SIGNS:  Blood pressure is 98/62, heart rate is 100, and weight is  212.  HEENT:  Normal.  NECK:  Supple.   There is no obvious JVD.  Carotids are 2+ bilaterally  without any bruits.  There is no lymphadenopathy or thyromegaly.  CARDIAC:  PMI is not palpable.  He is regular and little bit  tachycardiac.  No obvious murmur.  LUNGS:  Clear.  ABDOMEN:  Obese, nontender, nondistended.  No hepatosplenomegaly, no  bruits, no masses appreciated.  EXTREMITIES:  Warm with no cyanosis, clubbing, or edema.  NEUROLOGIC:  Alert and oriented x3.  Cranial nerves II through XII are  intact.  Moves all four extremities without difficulty.  Affect is  pleasant.   EKG shows atrial fibrillation at a rate of 100.  No ST-T wave  abnormalities.   ASSESSMENT AND PLAN:  1. Fatigue.  I suspect this is multifactorial.  I do not think this is      solely due to his atrial fibrillation as even when he is in sinus  rhythm, he has been having the fatigue.  It really has not changed      over the last 7 months.  2. Paroxysmal atrial fibrillation with tachybrady syndrome.  We will      stop his propranolol and put him on Cardizem 120.  We will put      another monitor on him for 2 weeks and to make sure that he does      not need a pacemaker.  He he has been intolerant of ANTICOAGULATION      due to severe epistaxis with just a full-dose aspirin.  He has      flatly refused Coumadin.  We will try and titrate his aspirin up to      162.  3. Coronary artery disease, this is stable.  No evidence of ischemia.  4. Hyperlipidemia as per Dr. Tenny Craw.  Given his coronary artery disease,      goal LDL is less than 70.   DISPOSITION:  We will see him back in several months.     [name], MD  Electronically Signed    DRB/MedQ  DD: 12/31/2008  DT: 01/01/2009  Job #: [id number]

## 2011-05-09 NOTE — Assessment & Plan Note (Signed)
[facility]                            CARDIOLOGY OFFICE NOTE   NAME:[name], [name]                       MRN:          [id number]  DATE:10/27/2008                            DOB:          1923/06/16    PRIMARY CARE PHYSICIAN:  C. Don Lope, MD   INTERVAL HISTORY:  [name] is a very complicated 77 year old male who  has suffered a non-ST elevation myocardial infarction back in July 2009.  He underwent bare-metal stenting to his subtotally occluded diagonal  branch.  The remainder of his coronary arteries just showed mild  nonobstructive disease.  He had a normal LV function.  He also has a  history of paroxysmal atrial fibrillation complicated by sick sinus  syndrome.  He wore Holter monitor, which showed some bradycardia with  sinus pauses up to 2.5 seconds but nothing longer than that.  He then  underwent a life watch monitor.  Unfortunately, this is associated with  a poor data set due to profuse sweating and loss of the lead so we only  had data from 3 of the 21 days.  There were some minor episodes of  atrial fibrillation with rapid ventricular response.  Finally, he has  also had problems with hyperkalemia and worsening renal function.  He  was seen by Dr. Excell [name] who stopped his diuretics, his diclofenac.  He  also received some fluids.   He returns today for followup.  He remains very fatigued.  He has been  spending a lot of energy taking care of his wife at home who is having  progressive dementia.  He denies any chest pain.  He does get a little  bit short of breath when he really pushes himself, but this is not his  most prominent symptoms.  He has not had any orthopnea, no PND, no  syncope.  [name] saw him recently and noted him to be more  bradycardic and switched his Lopressor over to propranolol.  He says he  feels a little bit better.  Despite his fatigue, he has been able to mow  his grass with a riding mower and take care  of his dogs.  He does also  have a history of sleep apnea, but he only wore CPAP for 1 week and has  not worn it any further.   CURRENT MEDICATIONS:  1. Simvastatin 40 a day.  2. Synthroid 150 mcg a day.  3. Propranolol 60 mg a day.   He is not taking aspirin due to history of severe epistaxis.  He is also  not on Coumadin for this reason.   PHYSICAL EXAMINATION:  GENERAL:  He is an elderly male in no acute  distress.  He ambulates around the clinic without any respiratory  difficulty.  VITAL SIGNS:  Blood pressure is 110/70, heart rate is 60, weight is 210.  HEENT:  Normal.  NECK:  Supple.  There is no JVD.  Carotids are 2+ bilaterally without  any bruits.  There is no lymphadenopathy or thyromegaly.  CARDIAC:  PMI is nondisplaced.  He is bradycardic and  regular.  No  obvious murmurs, rubs, or gallops.  LUNGS:  Clear.  ABDOMEN:  Soft, obese, nontender, and nondistended.  No  hepatosplenomegaly, no bruits, no masses.  EXTREMITIES:  Warm with no cyanosis, clubbing, or edema.  No rash.  NEURO:  Alert and oriented x3.  Cranial nerves II through XII are  intact.  He moves all 4 extremities without difficulty.  Affect is  pleasant.   LABORATORY DATA:  Sodium is 142, potassium 4.8, BUN is 35, creatinine is  2.9 which has progressively increased from 1.6 in few months since his  catheterization.   ASSESSMENT AND PLAN:  1. Fatigue and mild exertional dyspnea.  This is likely      multifactorial.  Initially, I was concerned about bradycardia in      the setting of sick sinus syndrome.  However, this does not seem to      be the principal driver here.  I also wonder if he maybe suffering      from some depression in the setting of his wife's illness.  I think      the most helpful test at this point would be a cardiopulmonary      exercise test to evaluate his level of functional status more      clearly.  We previously suggested cardiac rehab after his heart      attack, but he could  not do this due to the time commitment.  2. Progressive renal insufficiency.  We will send him to the      nephrologist here in the next few weeks for further evaluation.  3. Coronary artery disease.  No evidence of ischemia.  Continue      current therapy.  4. Paroxysmal atrial fibrillation with sick sinus syndrome, currently      in sinus rhythm, not a Coumadin candidate.  We will have him      restart his aspirin as he tolerates.  5. Hyperlipidemia.  This is followed by [name].  Goal LDL is less      than 70.   DISPOSITION:  We will see him back after his CPX test for further plans.     Don Buckles. Bensimhon, MD  Electronically Signed    DRB/MedQ  DD: 10/27/2008  DT: 10/27/2008  Job #: [id number]   cc:   [name], M.D.  [facility]  [name]., MD

## 2011-05-09 NOTE — H&P (Signed)
[name], [name] NO.:  [id number]   MEDICAL RECORD NO.:  [id number]          PATIENT TYPE:  INP   LOCATION:  4715                         FACILITY:  [facility]   PHYSICIAN:  [name], MD, FACCDATE OF BIRTH:  08-19-1923   DATE OF ADMISSION:  06/25/2008  DATE OF DISCHARGE:                              HISTORY & PHYSICAL   PRIMARY CARDIOLOGIST:  Dr. [name].   PRIMARY CARE PHYSICIAN:  Barbette Hair. Artist Pais, DO.   REASON FOR ADMISSION:  Chest pain.   HISTORY OF PRESENT ILLNESS:  This 77 year old obese Caucasian male with  history of chest pain and was seen in recent admission at [facility] for  chest pain on June 11, 2008, and was seen by Dr. Arvilla Meres in  consultation with recommendations for an outpatient cath or to come back  to hospital for recurrence of symptoms.  The patient originally was  admitted for chest discomfort and bradycardia during the admission on  June 11, 2008, and was found to have some bradycardia with a heart rate  in the 40s.  The patient also has a history of hypothyroidism and  medications were adjusted.  Dr. Gala Romney felt that it was important for  the patient to have a cardiac catheterization, concerning his symptoms  of chest pain and shortness of breath with a strong family history and  other risk factors.  At that time primary care physician also had  requested opinion for a need for pacemaker.  A.  I suspect at some point, he may need a pacemaker in light of his  underlying conduction disease.  However, I am also significantly  concerned about the underlying coronary artery disease.  Thus, at this  point, I think it is the best plan to proceed with cardiac  catheterization and we will schedule this as an outpatient per Dr.  Arvilla Meres.   The patient was scheduled for cardiac catheterization, but cancelled it  on his own stating that he felt that the chest pain was not related to  his heart.  He had had an  outbreak of shingles and he felt that was  related to it.  The patient's family was very frustrated with him, but  compliant with his wishes.  Approximately, three days ago, the patient  had recurrence of chest discomfort, which would come and go without  exertion.  However, he states that he was able to mow his lawn and do  work around the house without complaints of chest pain.  Today, while  sitting and watching TV, he had chest pressure on the left side of his  chest, which was pretty severe.  The patient states that it was an 8/10,  which brought tears to his eyes and he did have some mild associated  shortness of breath and a little bit of diaphoresis, although he states  that it was not excessive.  As a result of this, he went to his primary  care physician's office Dr. Scotty Court who evaluated him and called EMS  having him come to [facility] ER for further evaluation and  we are here  to see him in current state.  The patient is without chest pain at  present.  He has also been complaining of some lower extremity edema,  which was worse about three days ago and also the patient's daughter has  noticed that he has put on a good bit of weight in his abdomen over the  last couple of weeks.   REVIEW OF SYSTEMS:  Positive for chest pain, shortness of breath,  dyspnea on exertion, lower extremity edema, lightheadedness, otherwise  systems are reviewed and are found to be negative.   PAST MEDICAL HISTORY:  1. Atypical chest pain.      a.     Recent hospitalization June 11, 2008 to June 12, 2008, with       cardiac evaluation revealing a negative cardiac enzymes for       recommendation for cardiac catheterization as an outpatient.  2. Hypothyroidism.  3. Borderline diabetes, diet controlled.  4. Sinus bradycardia, transient.  5. Osteoarthritis.  6. History of prostate carcinoma.  7. History of hyperlipidemia.  8. Amenia.  9. B12 deficiency.  10.History of shingles.   Last  echocardiogram in June 2009, revealing mild aortic valve stenosis,  but normal LV function with no wall motion abnormalities.   PAST SURGICAL HISTORY:  1. Kidney stone removal.  2. Appendectomy.  3. Hernia repair.  4. Lumbar surgery.  5. Vasectomy.   SOCIAL HISTORY:  The patient lives in [place] with his wife who has  Alzheimer's and he is her primary caregiver.  He is retired from [employer]  He has a daughter and a son.  He quit smoking 50 years ago.  Negative for ETOH drug use.  The patient is mildly active.   FAMILY HISTORY:  Mother died of hypertension and CVA.  Father died of  CAD complications, MI, and alcohol abuse.  He has two brothers, both of  which had been deceased from CAD and did undergo CABG prior to their  death in earlier years.  He has one sister who has also had coronary  artery bypass grafting.   CURRENT MEDICATIONS AT HOME:  1. Synthroid 75 mcg once a day.  2. Vitamin B12 100 mg weekly.   ALLERGIES:  TETANUS.   CURRENT LABS:  Hemoglobin 12.1, hematocrit 34.7, white blood cells 6.8,  and platelets 201.  Sodium 138, potassium 4.6, chloride 109, CO2 24,  glucose 133, BUN 30, creatinine 1.62, and calcium 8.0.  PT 30.8, INR  1.0, and APTT 29.  Point-of-care myoglobin 85.8, CK-MB 2.1, and troponin  less than 0.05.  Chest x-ray reveals linear subsegmental atelectasis in  the left base.   PHYSICAL EXAMINATION:  VITAL SIGNS:  Blood pressure 138/63, pulse 68,  respirations 29, temperature 97.1, and O2 sat 97% on 2 L.  HEENT:  Head is normocephalic and atraumatic.  Eyes, PERRLA.  Mucous  membranes are both pink and moist.  Tongue is midline.  NECK:  Supple.  It is obese.  There is mild JVD without carotid bruits  appreciated.  CARDIOVASCULAR:  Regular rate and rhythm with 1/6 systolic murmur  auscultated.  Radial pulses and dorsalis pedis pulses are 1+ and equal  bilaterally.  LUNGS:  Essentially clear to auscultation.  ABDOMEN:  Obese with mild distension  without ballottement.  No rebound  or guarding is noted.  EXTREMITIES:  Without clubbing.  He does have mild 1+ pretibial edema  noted bilaterally.  There was no cyanosis seen.  NEUROLOGIC:  Cranial nerves II-XII are grossly intact.   IMPRESSION:  1. Chest pain.  Suspicious for unstable angina.  2. Bradycardia.  Heart rate in the 70s now at this time.   PLAN:  This is an 77 year old obese Caucasian male with known history of  recurrent atypical chest pain with recent admission to [facility] between the dates of June 11, 2008 and June 12, 2008.  He was  scheduled for outpatient cardiac catheterization last week, but the  patient cancelled stating he did not feel the pain he was experiencing  was related to his heart with recurrence of chest discomfort over the  last 3 days with a severe bout of chest pain today at noon while  watching television.   The patient was seen and examined by myself and Dr. [name] in  the emergency room.  The patient has complaints of recent chest pain and  plans for cardiac catheterization.  However, the patient refused cath  now, presents to the emergency room with severe resting substernal pain,  which was relieved with sublingual nitroglycerin, which he describes as  an 8/10 with mild nausea and mild diaphoresis.  He has no acute EKG  changes, however, the patient is suspicious for unstable angina.   The patient will be admitted.  We will cycle cardiac enzymes, begin  heparin drip, monitor the patient's response.  Continue topical  nitroglycerin and make further recommendations throughout hospital  course.  If chest pain returns or EKG changes are evident or positive  cardiac enzymes, our plan will be to proceed with cardiac  catheterization.  It has been advised to the patient that this is a  holiday weekend and unless he has an acute situation occur, our plans  will be to have cardiac catheterization within two days, on Monday with   careful monitoring throughout the weekend.  He verbalizes understanding  and reluctantly agrees to be admitted.  We will make further  recommendations after cath is completed.      Bettey Mare. Lyman Bishop, NP      [name], MD, [facility]  Electronically Signed    KML/MEDQ  D:  06/25/2008  T:  06/26/2008  Job:  [id number]   cc:   Barbette Hair. Artist Pais, DO

## 2011-05-12 NOTE — H&P (Signed)
[facility]  Patient:    [name], Don "W.A."                  MRN: [id number] Adm. Date:  08/17/00 Attending:  R. Valma Cava, M.D. Dictator:   Ralene Bathe, P.A. CC:         Feliciana Rossetti, M.D.   History and Physical  DATE OF BIRTH:  [birth year].  DATE OF HISTORY AND PHYSICAL:  August 09, 2000.  CHIEF COMPLAINT:  Right knee pain.  HISTORY OF PRESENT ILLNESS:  This is a 77 year old male who has had progressive and chronic right knee pain.  He has known osteoarthritis and has failed conservative measures, including nonsteroidal anti-inflammatories as well as injections.  His knee pain has become quite severe and is limiting his lifestyle.  He is quite active and wishes to proceed with surgical intervention.  Physical exam shows antalgic gait, favoring the right side. The right knee has a varus malalignment with 2+ crepitation and trace effusion.  Diffusely tender to the medial side.  Range of motion at about 5-100.  X-rays show a varus malalignment, bone-on-bone contact in the medial compartment.  He also has some patellofemoral changes.  With these findings and his progression of osteoarthritis to end-stage with no relief, surgical intervention is indicated with total knee replacement arthroplasty.  Risks and benefits discussed with patient at great length, and he is in agreement and wishes to proceed.  PAST MEDICAL HISTORY: 1. Hypothyroidism. 2. History of borderline diabetes, on no medications. 3. Osteoarthritis. 4. History of prostate cancer.  PAST SURGICAL HISTORY: 1. Removal of a kidney stone. 2. Appendectomy. 3. Hernia repair. 4. Carpal tunnel release on the right. 5. Lumbar surgery several years ago. 6. Removal of a cyst on his neck. 7. Vasectomy. 8. Prostatectomy.  ALLERGIES:  HORSE SERUM from a tetanus shot.  MEDICATIONS:  Synthroid 25 mcg q.d.  He just received a vitamin B12 shot on his last visit to Dr.  Quintella Reichert.  FAMILY HISTORY:  Significant for mother with hypertension.  SOCIAL HISTORY:  Lives with his wife in a one-story home with two steps into the usual entrance.  Does not use tobacco nor alcohol.  He did not donate autologous blood.  He was seen and cleared by Dr. Quintella Reichert.  REVIEW OF SYSTEMS:  The patient denies any recent fevers, chills, night sweats.  No bleeding tendencies.  CNS:  No blurred vision, seizures, or paralysis.  RESPIRATORY:  No shortness of breath, productive cough, or hemoptysis.  CARDIOVASCULAR:  No chest pain, angina, or orthopnea. GASTROINTESTINAL:  No nausea, vomiting, constipation, melena, or bloody stools.  GENITOURINARY:  No dysuria or hematuria.  MUSCULOSKELETAL:  As pertinent to the present illness.  PHYSICAL EXAMINATION:  VITAL SIGNS:  Pulse 72, respirations 12, blood pressure 120/68.  HEENT:  Normocephalic.  Extraocular motions intact.  NECK:  Supple.  No lymphadenopathy.  No carotid bruits appreciated on exam.  CHEST:  Clear to auscultation bilaterally.  No rales or rhonchi.  HEART:  Regular rate and rhythm.  No murmurs, gallops, rubs, heaves, or thrills.  ABDOMEN:  Positive bowel sounds, soft, nontender.  EXTREMITIES:  Neurovascularly intact, and as in history of present illness.  IMPRESSION: 1. Right knee osteoarthritis. 2. Hypothyroidism. 3. History of borderline diabetes, on no medications. 4. Osteoarthritis. 5. History of prostate cancer.  PLAN:  The patient will be admitted for a right total knee arthroplasty. Disposition will be home with outpatient physical therapy.  Primary physician is Dr. Quintella Reichert, should  any medical needs arise. DD:  08/15/00 TD:  08/15/00 Job: [id number]

## 2011-05-12 NOTE — Op Note (Signed)
[facility]  Patient:    [name], [name]                         MRN: [id number] Proc. Date: 08/17/00 Attending:  R. Valma Cava, M.D. CC:         Feliciana Rossetti, M.D.   Operative Report  PREOPERATIVE DIAGNOSIS:  Right knee end-stage osteoarthritis.  POSTOPERATIVE DIAGNOSIS:  Right knee end-stage osteoarthritis.  OPERATION:  Right total knee arthroplasty.  SURGEON:  Daniel L. Thomasena Edis, M.D.  ASSISTANT:  French Ana Shuford, P.A.-C.  ANESTHESIA:  General endotracheal.  ESTIMATED BLOOD LOSS:  Less than 50 cc.  DRAINS:  Two medium Hemovacs.  COMPLICATIONS:  None.  DISPOSITION:  PACU, stable.  OPERATIVE IMPLANTS:  Osteonics components, posterior stabilized, size 9 femur, size 9 tibia, 15 mm polyethylene insert, 26 mm polyethylene patella.  All components cemented.  OPERATIVE DETAILS:  The patient was counseled in the holding area.  Correct size identified.  IV started and antibiotics given.  Chart was reviewed and he was found to be a satisfactory candidate and he was taken to the operating room.  The patient was placed under general endotracheal anesthesia.  A Foley catheter placed by sterile technique by OR circulating nurse.  The right knee was examined.  A 5 degree flexion contracture, flexed to 100 degrees. Elevated.  Prepped with DuraPrep.  Draped in a usual sterile fashion. Exsanguinated with an Esmarch.  The tourniquet was inflated with 400 mmHg.  A standard anterior approach was made through the skin, midline incision through the skin and small veins electrocoagulated.  Medial and lateral flaps were developed appropriately.  Medial parapatellar arthrotomy was performed. Medial soft tissue released under proximal medial tibial due to varus malalignment.  The knee was flexed.  End-stage osteoarthritic change, bone-on-bone contact, marked synovitis.  Synovectomy was performed.  Cruciate ligaments resected.  Starting hole made in distal  femur.  Canal irrigated. Intramedullary rod placed.  A 5 degree valgus cut was chosen, 10 mm cut off the distal femur.  This femur was found to be a size #9.  Rotation marks were made.  Distal femur cuts to size #9.  Medial and lateral menisci were removed. Osteophytes removed from proximal tibia.  Tibial eminence resected.  The proximal tibia found to be a size #9.  A starting hole was made.  The step reamer was utilized.  Canal irrigated.  Intramedullary rod put in place.  A 10 mm proximal tibia cut was done and a 5 mm posterior slope after alignment was set.  Posteromedial and posterolateral femoral osteophytes were removed.  Femoral trochlear prepared in a standard fashion.  Trial implants, size 9 femur, size 9 tibia, 15 mm polyethylene insert.  Excellent range of motion, soft tissue balance, excellent alignment and tracking rotational marks were made on the proximal tibial.  Delta keel was performed in a standard fashion.  The patella was found to be size #26.  Reamed to a depth of 10 mm.  Locking holes were made and excess bone was removed.  At this time all components were cemented in place utilizing modern cement technique, size #9 femur, size #9 tibia, 26 mm patella.  After cement had allowed to cure, the knee was put through a range of motion with different trials and a 15 mm polyethylene insert gave excellent range of motion and soft tissue balance.  Patella tracking was somewhat lateral.  Lateral release was performed giving anatomic patellofemoral tracking.  The knee was  flexed, irrigated and a 15 mm tibial insert was placed.  The knee was put through a range of motion, well aligned, well balance in flexion extension.  Patellofemoral tracking was anatomic.  Two medium Hemovac drains were placed.  A sequential closure of the layers were done, irrigating with sterile antibiotic solution at the closure.  A synovium Vicryl, arthrotomy Vicryl, subcu Vicryl.  Skin closed with a  running Monocryl suture.  Benzoin and Steri-Strips applied.  Then 30 cc of 0.5% Marcaine with epinephrine was placed at the drain into the knee joint.  A sterile dressing was applied.  Tourniquet was deflated after one hour and 42 minutes.  Ace wraps, ice, and a knee immobilizer was applied to full extension.  Excellent circulation, pulses in foot and ankle at the end of the case.  He was given another gram of Ancef intravenously.  Tourniquet deflated.  Awakened, extubated and taken from the operating room to PACU in stable condition.   No complications.  Ms. Alonza Bogus assistance was needed throughout this entire surgical procedure. DD:  08/17/00 TD:  08/20/00 Job: [id number]

## 2011-05-12 NOTE — Discharge Summary (Signed)
[facility]  Patient:    [name], [name]                       MRN: [id number] Adm. Date:  [id number] Disc. Date: 08/21/00 Attending:  Erasmo Leventhal Dictator:   Grayland Jack, P.A.                           Discharge Summary  ADMISSION DIAGNOSES: 1. Right knee osteoarthritis. 2. Hypothyroidism. 3. Borderline diabetes, diet controlled. 4. Osteoarthritis. 5. History of prostate cancer.  DISCHARGE DIAGNOSES: 1. Status post right total knee arthroplasty. 2. Hypothyroidism. 3. Borderline diabetes, diet controlled. 4. Osteoarthritis. 5. History of prostate cancer.  PROCEDURES:  Right total knee arthroplasty, surgeon R. Valma Cava, M.D., assistant Ralene Bathe, P.A.  CONSULTATIONS:  None.  BRIEF HISTORY:  [name] is a 77 year old male with longstanding history of chronic and progressive right knee pain.  He has known osteoarthritis and failed numerous conservative treatments including nonsteroidal oral anti-inflammatories as well as intra-articular injections.  His knee pain has come to the point where it is interfering with his activities of daily living. X-ray showed varus malalignment with bone-on-bone deformity.  He has positive crepitation with range of motion and is tender to palpation on the medial joint line.  It is felt at this time the patient would best benefit from surgical intervention.  The risk and complications of surgery were discussed with the patient in detail, and he agreed to proceed.  HOSPITAL COURSE:  On August 17, 2000, the patient was admitted to [facility] and underwent the above-stated procedure which he tolerated well without complications.  Postoperatively, the patient did very well and had no postoperative complications.  Physical therapy was initiated postoperatively for total knee protocol.  Coumadin therapy was initiated postoperatively for DVT prophylaxis per pharmacy protocol.  Occupational  therapy was initiated for activities of daily living.  On August 21, 2000, the patient was awake and alert without complications.  He stated that he felt like he was ready to go home.  He denied pain, nausea, vomiting at this time.  On exam, the patients dressing was dry, the incision site was clean and dry, and the calves were soft and nontender.  He was neurovascularly intact to the right lower extremity.  The patient did have good ______ control.  It was felt the patient was stable for discharge at this time.  LABORATORY DATA:  Preoperative urinalysis was within normal limits.  Routine chemistries: Preoperatively, the patient had a slightly elevated potassium at 5.2 and a slightly elevated BUN at 28.  Postoperatively, the patient had a slightly elevated glucose at 144, and BUN remained slightly elevated at 28. Preoperative PT, INR, and PTT all were within normal limits.  The patient was placed on Coumadin therapy.  These values were followed by pharmacy.  Prior to discharge, the patients PT was 17.0, INR 1.7.  CBC on admission: The patients hemoglobin was slightly decreased at 12.7, hematocrit 38.1.  Prior to discharge, the patient remained relatively stable with a hemoglobin of 10.6 and hematocrit 31.6.  Preoperative chest x-ray showed no finding of clinical significance.  Preoperative x-rays of the right knee showed two compartment osteoarthritis, most severely in medial tibiofemoral joint space with bone-on-bone contact. There was lateral subluxation and possible posterior subluxation of the proximal tibia.  Preoperative EKG showed some sinus bradycardia.  CONDITION ON DISCHARGE:  Improved and stable.  DISCHARGE PLAN:  The patient is being discharged to home.  He is to remain weightbearing as tolerated and continue with his total knee precautions.  He may shower on postop day #5.  He will need daily dry dressing changes.  He is to resume his home medications.  DISCHARGE  MEDICATIONS: 1. Percocet 5 mg, #40. 2. Robaxin 500 mg, #20. 3. Coumadin per pharmacy protocol. 4. Over-the-counter stool softener.  DIET:  The patient is to increase his fiber intake as well as increase his water intake.  FOLLOWUP:  The patient is to return to the clinic two weeks postoperatively to follow up with Dr. Thomasena Edis.  Please call [phone] to make appointment.  This patient will participate in physical therapy at [facility] which will begin Wednesday, August 22, 2000. DD:  08/21/00 TD:  08/21/00 Job: [id number]

## 2011-06-19 ENCOUNTER — Emergency Department (HOSPITAL_BASED_OUTPATIENT_CLINIC_OR_DEPARTMENT_OTHER)
Admission: EM | Admit: 2011-06-19 | Discharge: 2011-06-19 | Disposition: A | Discharge status: Home / Self Care | Payer: Medicare Other | Attending: Emergency Medicine | Admitting: Emergency Medicine

## 2011-06-19 ENCOUNTER — Emergency Department (INDEPENDENT_AMBULATORY_CARE_PROVIDER_SITE_OTHER): Payer: Medicare Other

## 2011-06-19 DIAGNOSIS — I2510 Atherosclerotic heart disease of native coronary artery without angina pectoris: Secondary | ICD-10-CM | POA: Insufficient documentation

## 2011-06-19 DIAGNOSIS — K8689 Other specified diseases of pancreas: Secondary | ICD-10-CM

## 2011-06-19 DIAGNOSIS — Q619 Cystic kidney disease, unspecified: Secondary | ICD-10-CM

## 2011-06-19 DIAGNOSIS — R748 Abnormal levels of other serum enzymes: Secondary | ICD-10-CM | POA: Insufficient documentation

## 2011-06-19 DIAGNOSIS — J4489 Other specified chronic obstructive pulmonary disease: Secondary | ICD-10-CM | POA: Insufficient documentation

## 2011-06-19 DIAGNOSIS — J449 Chronic obstructive pulmonary disease, unspecified: Secondary | ICD-10-CM | POA: Insufficient documentation

## 2011-06-19 DIAGNOSIS — K5730 Diverticulosis of large intestine without perforation or abscess without bleeding: Secondary | ICD-10-CM

## 2011-06-19 DIAGNOSIS — R1013 Epigastric pain: Secondary | ICD-10-CM | POA: Insufficient documentation

## 2011-06-19 DIAGNOSIS — I10 Essential (primary) hypertension: Secondary | ICD-10-CM | POA: Insufficient documentation

## 2011-06-19 DIAGNOSIS — E119 Type 2 diabetes mellitus without complications: Secondary | ICD-10-CM | POA: Insufficient documentation

## 2011-06-19 DIAGNOSIS — N289 Disorder of kidney and ureter, unspecified: Secondary | ICD-10-CM | POA: Insufficient documentation

## 2011-06-19 LAB — CK TOTAL AND CKMB (NOT AT ARMC): CK, MB: 2.6 ng/mL (ref 0.3–4.0)

## 2011-06-19 LAB — DIFFERENTIAL
Basophils Absolute: 0 10*3/uL (ref 0.0–0.1)
Eosinophils Relative: 1 % (ref 0–5)
Lymphocytes Relative: 25 % (ref 12–46)
Monocytes Absolute: 0.6 10*3/uL (ref 0.1–1.0)
Monocytes Relative: 7 % (ref 3–12)

## 2011-06-19 LAB — COMPREHENSIVE METABOLIC PANEL
ALT: 124 U/L — ABNORMAL HIGH (ref 0–53)
Alkaline Phosphatase: 139 U/L — ABNORMAL HIGH (ref 39–117)
BUN: 29 mg/dL — ABNORMAL HIGH (ref 6–23)
CO2: 25 mEq/L (ref 19–32)
Chloride: 103 mEq/L (ref 96–112)
GFR calc Af Amer: 50 mL/min — ABNORMAL LOW (ref >60)
GFR calc non Af Amer: 41 mL/min — ABNORMAL LOW (ref >60)
Glucose, Bld: 128 mg/dL — ABNORMAL HIGH (ref 70–99)
Potassium: 4.3 mEq/L (ref 3.5–5.1)
Sodium: 138 mEq/L (ref 135–145)
Total Bilirubin: 0.2 mg/dL — ABNORMAL LOW (ref 0.3–1.2)

## 2011-06-19 LAB — LIPASE, BLOOD: Lipase: 36 U/L (ref 11–59)

## 2011-06-19 LAB — CBC
HCT: 40.2 % (ref 39.0–52.0)
MCH: 33.3 pg (ref 26.0–34.0)
MCHC: 33.6 g/dL (ref 30.0–36.0)
MCV: 99.3 fL (ref 78.0–100.0)
RDW: 12.5 % (ref 11.5–15.5)

## 2011-06-19 MED ORDER — IOHEXOL 300 MG/ML  SOLN
100.0000 mL | Freq: Once | INTRAMUSCULAR | Status: AC | PRN
  Administered 2011-06-19: 100 mL via INTRAVENOUS

## 2011-07-05 ENCOUNTER — Ambulatory Visit: Payer: Medicare Other | Attending: Family Medicine

## 2011-07-05 DIAGNOSIS — I69991 Dysphagia following unspecified cerebrovascular disease: Secondary | ICD-10-CM | POA: Insufficient documentation

## 2011-07-05 DIAGNOSIS — IMO0001 Reserved for inherently not codable concepts without codable children: Secondary | ICD-10-CM | POA: Insufficient documentation

## 2011-07-05 DIAGNOSIS — R1310 Dysphagia, unspecified: Secondary | ICD-10-CM | POA: Insufficient documentation

## 2011-09-21 LAB — CBC
HCT: 32.1 — ABNORMAL LOW
HCT: 38.2 — ABNORMAL LOW
HCT: 26.5 — ABNORMAL LOW
HCT: 30.9 — ABNORMAL LOW
HCT: 31 — ABNORMAL LOW
HCT: 31 — ABNORMAL LOW
HCT: 31.1 — ABNORMAL LOW
Hemoglobin: 11.3 — ABNORMAL LOW
Hemoglobin: 10.2 — ABNORMAL LOW
Hemoglobin: 10.8 — ABNORMAL LOW
Hemoglobin: 8.9 — ABNORMAL LOW
MCHC: 34.7
MCHC: 35.2
MCHC: 33.6
MCHC: 34
MCHC: 34.7
MCHC: 35
MCV: 114.4 — ABNORMAL HIGH
MCV: 115.1 — ABNORMAL HIGH
MCV: 115.5 — ABNORMAL HIGH
MCV: 115.6 — ABNORMAL HIGH
MCV: 115.7 — ABNORMAL HIGH
MCV: 116.6 — ABNORMAL HIGH
Platelets: 219
Platelets: 257
Platelets: 177
Platelets: 184
Platelets: 201
Platelets: 205
Platelets: 230
RBC: 2.81 — ABNORMAL LOW
RBC: 2.32 — ABNORMAL LOW
RBC: 2.61 — ABNORMAL LOW
RBC: 2.67 — ABNORMAL LOW
RDW: 20.5 — ABNORMAL HIGH
RDW: 20.7 — ABNORMAL HIGH
RDW: 21 — ABNORMAL HIGH
RDW: 18.7 — ABNORMAL HIGH
RDW: 19.1 — ABNORMAL HIGH
RDW: 19.1 — ABNORMAL HIGH
WBC: 6.8
WBC: 6.9
WBC: 7.5
WBC: 7.6
WBC: 7.8
WBC: 9.7

## 2011-09-21 LAB — CARDIAC PANEL(CRET KIN+CKTOT+MB+TROPI)
CK, MB: 2.9
CK, MB: 6 — ABNORMAL HIGH
CK, MB: 8.2 — ABNORMAL HIGH
Relative Index: INVALID
Relative Index: INVALID
Total CK: 92
Total CK: 57
Total CK: 58
Troponin I: 0.05
Troponin I: 0.06

## 2011-09-21 LAB — CK TOTAL AND CKMB (NOT AT ARMC)
CK, MB: 3.4
CK, MB: 4.6 — ABNORMAL HIGH
Relative Index: INVALID
Relative Index: INVALID
Total CK: 87
Total CK: 38
Total CK: 62

## 2011-09-21 LAB — POCT I-STAT, CHEM 8
BUN: 24 — ABNORMAL HIGH
BUN: 34 — ABNORMAL HIGH
Chloride: 108
Creatinine, Ser: 1.4
Glucose, Bld: 135 — ABNORMAL HIGH
Hemoglobin: 11.2 — ABNORMAL LOW
Potassium: 4.5
Potassium: 4.8
Sodium: 140
Sodium: 138

## 2011-09-21 LAB — BASIC METABOLIC PANEL
BUN: 23
BUN: 19
BUN: 20
BUN: 23
BUN: 26 — ABNORMAL HIGH
BUN: 30 — ABNORMAL HIGH
CO2: 26
CO2: 22
CO2: 24
Calcium: 8 — ABNORMAL LOW
Calcium: 8 — ABNORMAL LOW
Calcium: 8.1 — ABNORMAL LOW
Chloride: 106
Chloride: 105
Chloride: 106
Chloride: 109
Creatinine, Ser: 1.38
Creatinine, Ser: 1.35
Creatinine, Ser: 1.36
Creatinine, Ser: 1.6 — ABNORMAL HIGH
Creatinine, Ser: 1.62 — ABNORMAL HIGH
GFR calc Af Amer: >60
GFR calc Af Amer: >60
GFR calc non Af Amer: 49 — ABNORMAL LOW
GFR calc non Af Amer: 41 — ABNORMAL LOW
GFR calc non Af Amer: 50 — ABNORMAL LOW
GFR calc non Af Amer: 50 — ABNORMAL LOW
GFR calc non Af Amer: 52 — ABNORMAL LOW
Glucose, Bld: 104 — ABNORMAL HIGH
Glucose, Bld: 94
Glucose, Bld: 105 — ABNORMAL HIGH
Glucose, Bld: 105 — ABNORMAL HIGH
Glucose, Bld: 129 — ABNORMAL HIGH
Potassium: 4.3
Potassium: 4.9
Potassium: 4.2
Potassium: 4.2
Potassium: 4.4
Sodium: 138
Sodium: 137
Sodium: 140

## 2011-09-21 LAB — POCT CARDIAC MARKERS
Myoglobin, poc: 44.5
Myoglobin, poc: 77.7
Operator id: 146091
Operator id: 146091
Troponin i, poc: <0.05
Troponin i, poc: <0.05

## 2011-09-21 LAB — DIFFERENTIAL
Basophils Absolute: 0
Eosinophils Absolute: 0.4
Lymphocytes Relative: 26
Monocytes Relative: 8
Neutro Abs: 3.5

## 2011-09-21 LAB — HEPARIN LEVEL (UNFRACTIONATED)
Heparin Unfractionated: 0.13 — ABNORMAL LOW
Heparin Unfractionated: 0.28 — ABNORMAL LOW
Heparin Unfractionated: 0.28 — ABNORMAL LOW

## 2011-09-21 LAB — TSH: TSH: 6.796 — ABNORMAL HIGH (ref 0.350–4.500)

## 2011-09-21 LAB — RETICULOCYTES
RBC.: 3.06 — ABNORMAL LOW
Retic Count, Absolute: 55.1
Retic Ct Pct: 1.8

## 2011-09-21 LAB — URINALYSIS, ROUTINE W REFLEX MICROSCOPIC
Glucose, UA: NEGATIVE
Hgb urine dipstick: NEGATIVE
Ketones, ur: NEGATIVE
Protein, ur: NEGATIVE
pH: 5

## 2011-09-21 LAB — IRON AND TIBC
Iron: 100
TIBC: 185 — ABNORMAL LOW

## 2011-09-21 LAB — LIPID PANEL
Cholesterol: 112
HDL: 32 — ABNORMAL LOW
LDL Cholesterol: 84
Total CHOL/HDL Ratio: 4.1
VLDL: 18

## 2011-09-21 LAB — TROPONIN I
Troponin I: 0.08 — ABNORMAL HIGH
Troponin I: 0.73

## 2011-09-21 LAB — PROTIME-INR: Prothrombin Time: 13.1

## 2011-09-21 LAB — HEMOGLOBIN A1C: Mean Plasma Glucose: 133

## 2011-09-21 LAB — URINE CULTURE
Colony Count: NO GROWTH
Culture: NO GROWTH

## 2011-09-21 LAB — MAGNESIUM: Magnesium: 2.1

## 2011-09-21 LAB — APTT: aPTT: 28

## 2011-09-21 LAB — FERRITIN: Ferritin: 179 (ref 22–322)

## 2011-09-21 LAB — VITAMIN B12: Vitamin B-12: 64 — ABNORMAL LOW (ref 211–911)

## 2011-09-29 LAB — COMPREHENSIVE METABOLIC PANEL
ALT: 16 U/L (ref 0–53)
Alkaline Phosphatase: 35 U/L — ABNORMAL LOW (ref 39–117)
CO2: 24 mEq/L (ref 19–32)
GFR calc non Af Amer: 31 mL/min — ABNORMAL LOW (ref >60)
Glucose, Bld: 169 mg/dL — ABNORMAL HIGH (ref 70–99)
Potassium: 4.2 mEq/L (ref 3.5–5.1)
Sodium: 131 mEq/L — ABNORMAL LOW (ref 135–145)
Total Bilirubin: 1.1 mg/dL (ref 0.3–1.2)

## 2011-09-29 LAB — CARBOXYHEMOGLOBIN
Carboxyhemoglobin: 1.2 % (ref 0.5–1.5)
Methemoglobin: 1.1 % (ref 0.0–1.5)
O2 Saturation: 92.7 %

## 2011-09-29 LAB — BASIC METABOLIC PANEL
CO2: 22 mEq/L (ref 19–32)
Calcium: 8.1 mg/dL — ABNORMAL LOW (ref 8.4–10.5)
GFR calc Af Amer: 40 mL/min — ABNORMAL LOW (ref >60)
Potassium: 4.5 mEq/L (ref 3.5–5.1)
Sodium: 133 mEq/L — ABNORMAL LOW (ref 135–145)

## 2011-09-29 LAB — CBC
HCT: 39.4 % (ref 39.0–52.0)
Hemoglobin: 13 g/dL (ref 13.0–17.0)
MCHC: 33 g/dL (ref 30.0–36.0)
RBC: 3.9 MIL/uL — ABNORMAL LOW (ref 4.22–5.81)

## 2011-09-29 LAB — DIFFERENTIAL
Basophils Relative: 0 % (ref 0–1)
Eosinophils Relative: 0 % (ref 0–5)
Lymphocytes Relative: 3 % — ABNORMAL LOW (ref 12–46)
Monocytes Absolute: 1.1 10*3/uL — ABNORMAL HIGH (ref 0.1–1.0)
Monocytes Relative: 11 % (ref 3–12)
Neutro Abs: 8.5 10*3/uL — ABNORMAL HIGH (ref 1.7–7.7)

## 2011-09-29 LAB — POCT CARDIAC MARKERS
CKMB, poc: <1 ng/mL — ABNORMAL LOW (ref 1.0–8.0)
Troponin i, poc: <0.05 ng/mL (ref 0.00–0.09)

## 2011-09-29 LAB — LIPASE, BLOOD: Lipase: 18 U/L (ref 11–59)

## 2011-09-29 LAB — CREATININE, URINE, RANDOM: Creatinine, Urine: 253.7 mg/dL

## 2012-03-28 IMAGING — CR DG ABDOMEN ACUTE W/ 1V CHEST
4 series · 4 of 4 positions shown · non-contrast
Comparison: Chest x-ray 12/24/2008.

CLINICAL DATA: Chest and abdominal pain.

DG ABDOMEN ACUTE WITH CHEST.

[w chest pa]
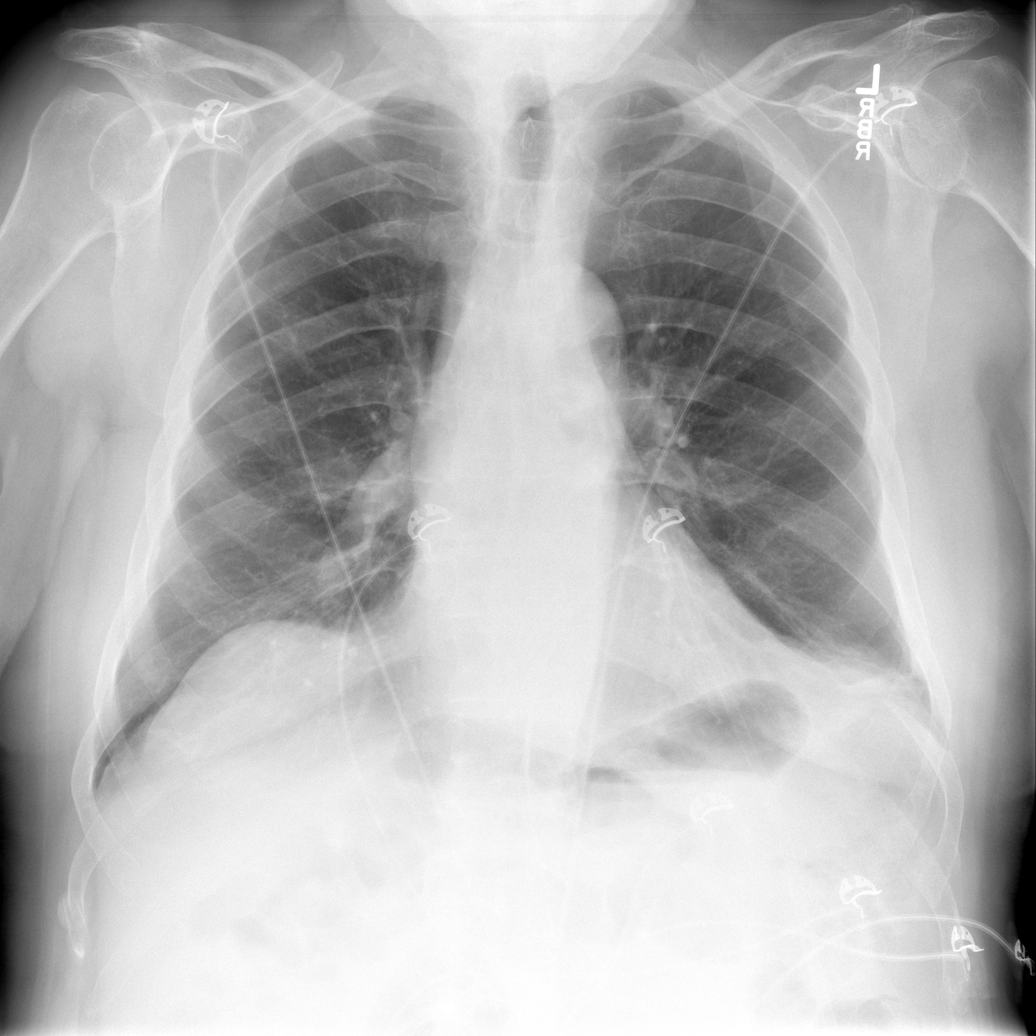

[w abdomen upright]
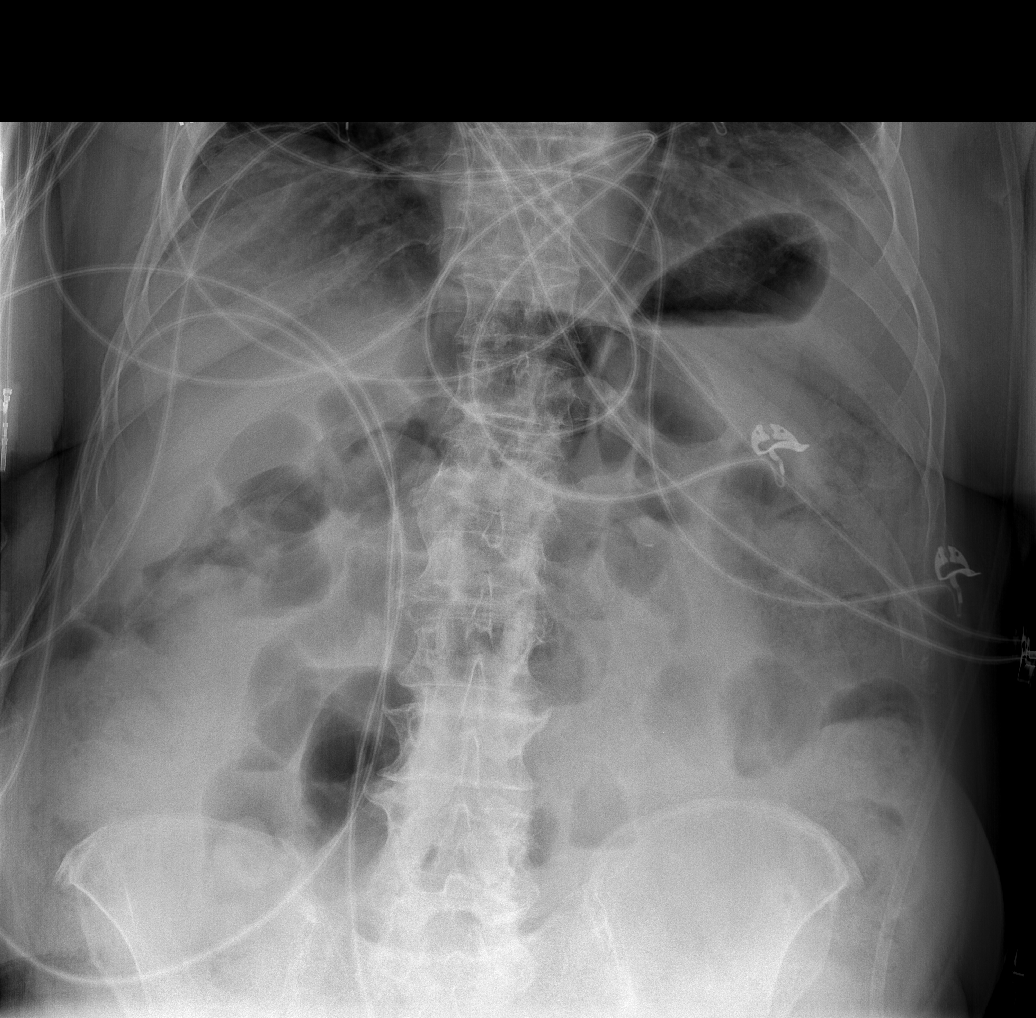

[t abdomen supine (1 of 2)]
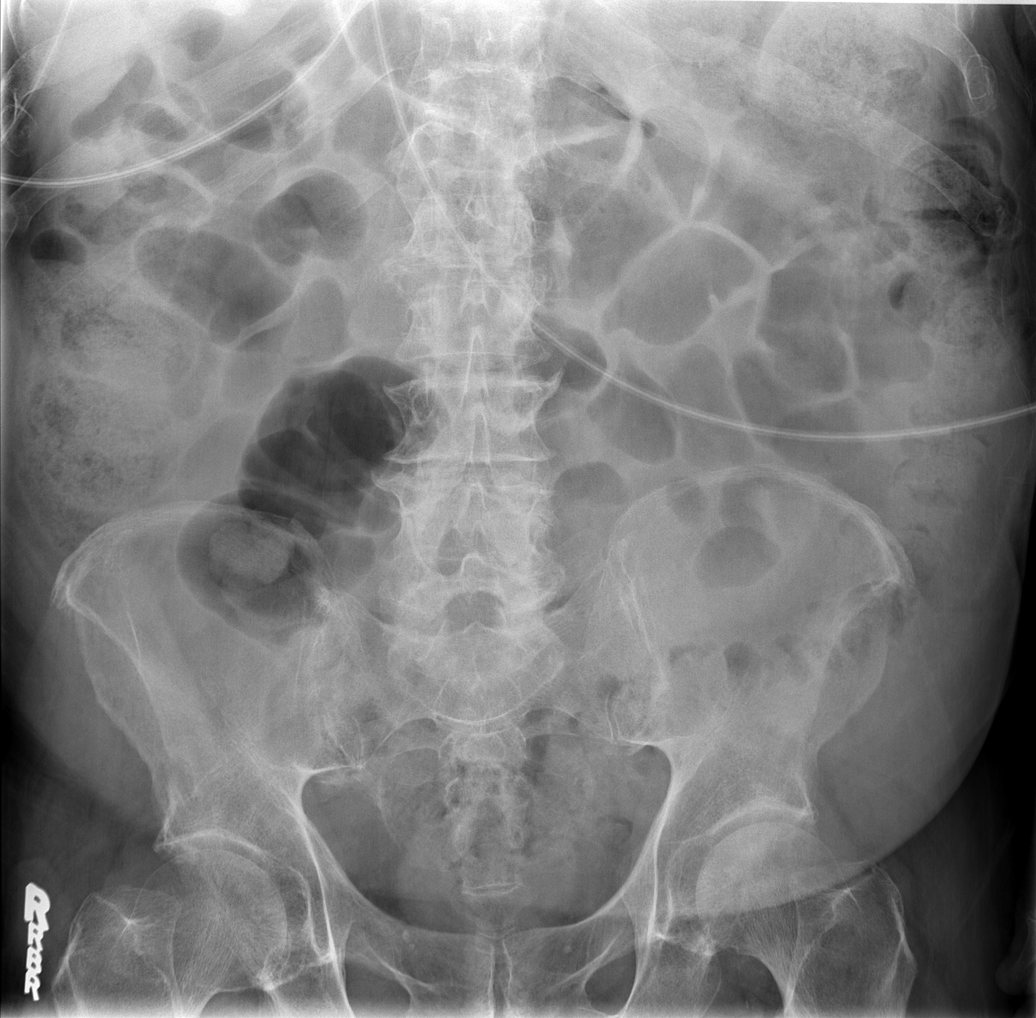

[t abdomen supine (2 of 2)]
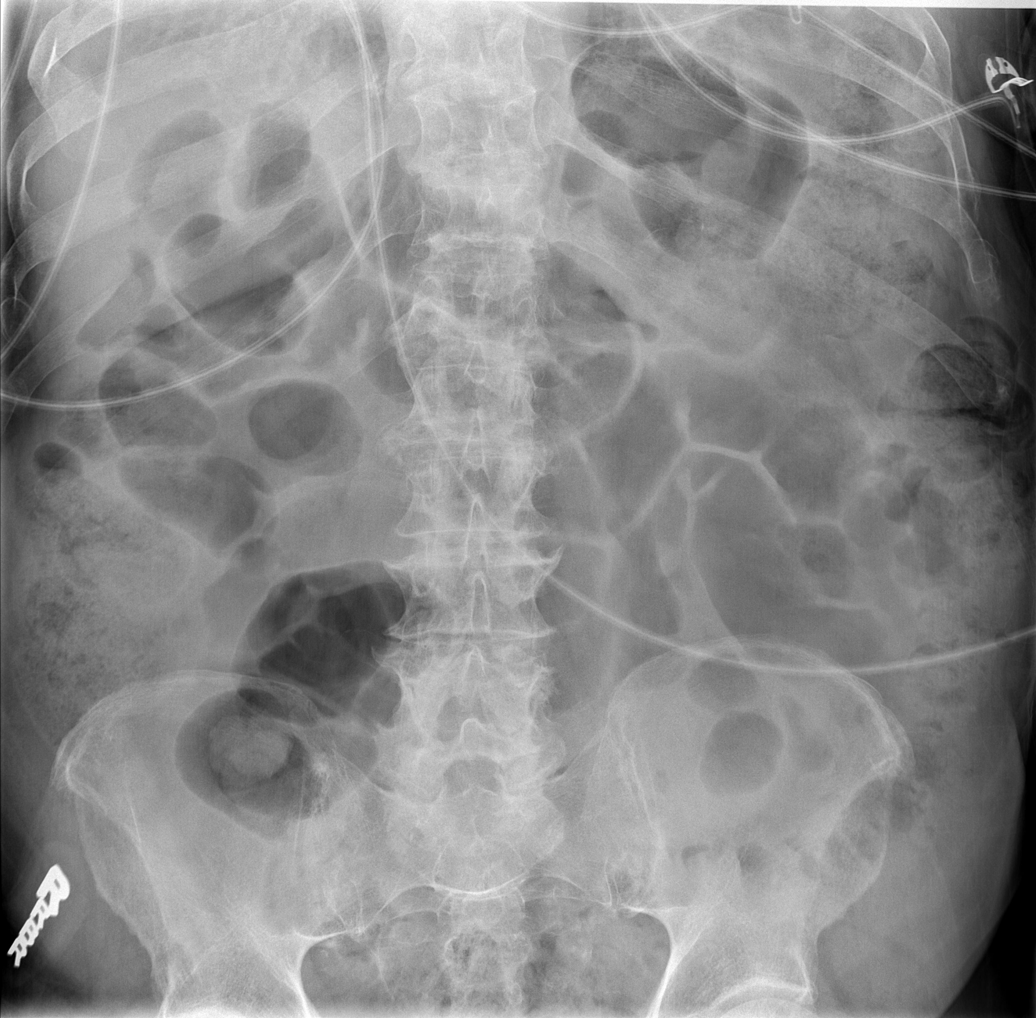

[4 of 4 positions shown; findings below may reference images not displayed]

FINDINGS: The upright chest x-ray demonstrates normal and stable
cardiac silhouette, mediastinal and hilar contours.  Stable
eventration of the right hemidiaphragm and left basilar scarring.

There are slightly dilated air filled small bowel loops throughout
the abdomen along with a moderate amount of stool and air
throughout the colon.  Findings suggest a diffuse ileus.  Recommend
correlation with bowel sounds.  No free air.  The bony structures
are unremarkable.  There are degenerative changes involving the
lumbar spine.
IMPRESSION: 1. No acute cardiopulmonary findings.  Slightly low lung volumes
with vascular crowding and streaky basilar atelectasis.
2.  Diffuse ileus bowel gas pattern.

## 2012-03-28 IMAGING — CT CT ABD-PELV W/ CM
2 of 5 series · 17 of 46 positions shown, 19 images · IV contrast (omnipaque)
Comparison: Plain films 10/10/2010

CLINICAL DATA: Epigastric pain.

CT ABDOMEN AND PELVIS WITH CONTRAST
TECHNIQUE: Multidetector CT imaging of the abdomen and pelvis was
performed following the standard protocol during bolus
administration of intravenous contrast.
Contrast: 80 ml Omnipaque 300 IV.

[Series 2: abd/pelvis 5.0 b31f · axial · 0.85mm/px · z∈[+350,+754]mm · 14 of 91 slices shown, 16 images]
[im 5/91  soft-tissue]
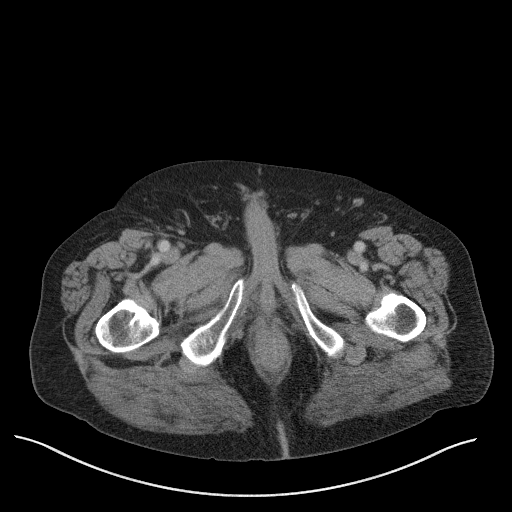
[im 5/91  bone]
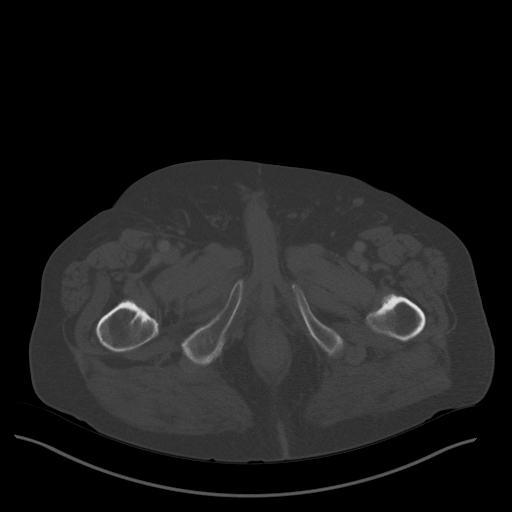
[im 10/91  soft-tissue]
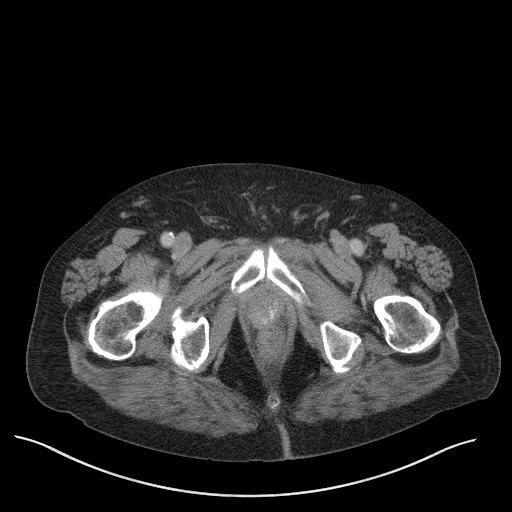
[im 19/91  soft-tissue]
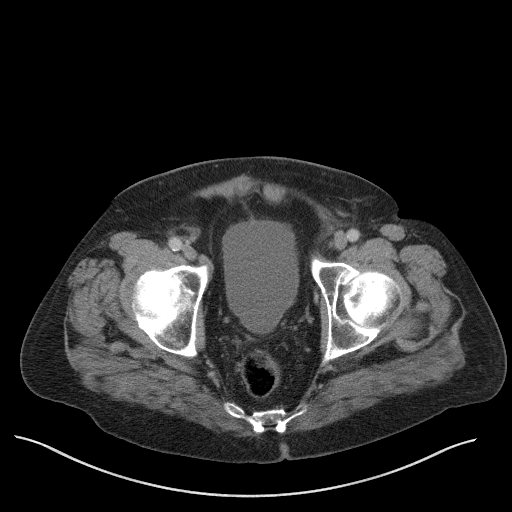
[im 24/91  soft-tissue]
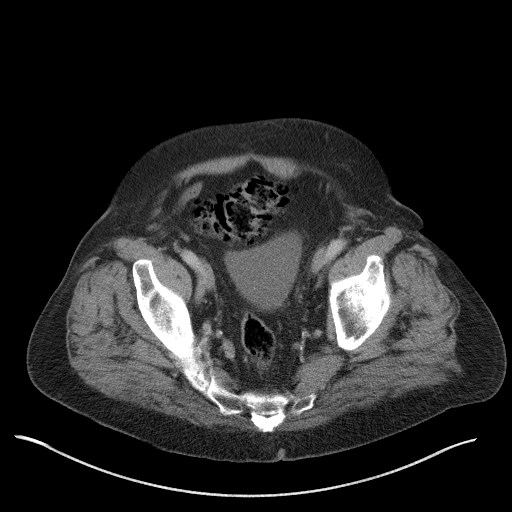
[im 29/91  soft-tissue]
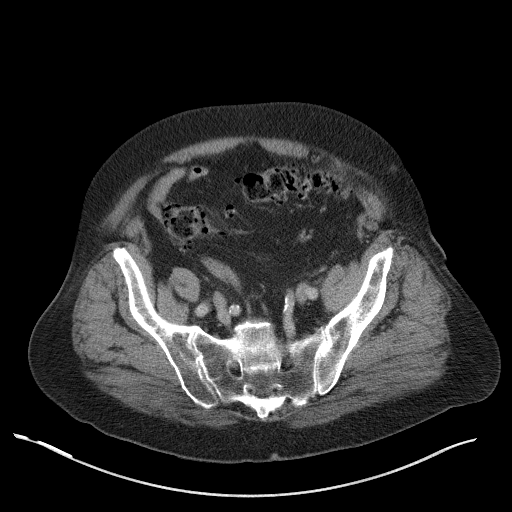
[im 38/91  soft-tissue]
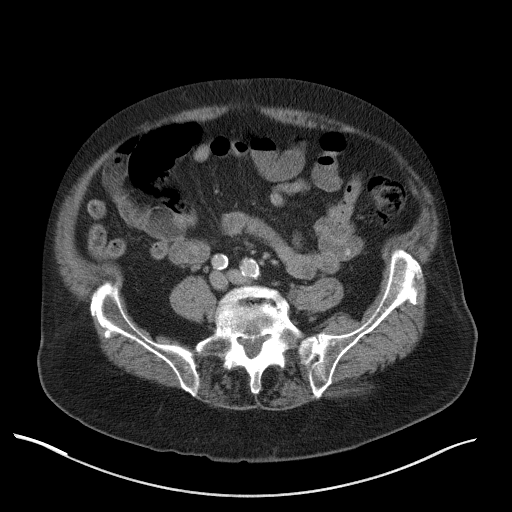
[im 43/91  soft-tissue]
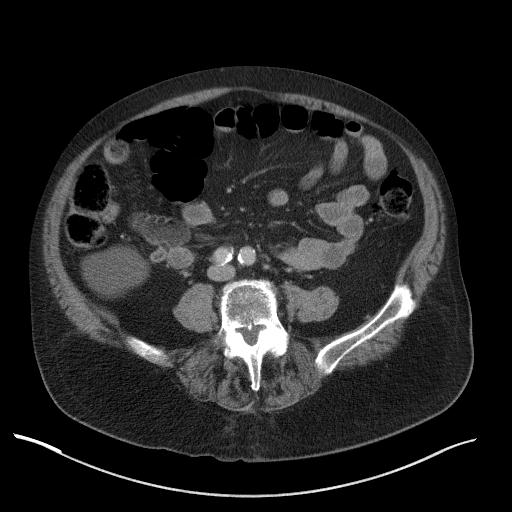
[im 48/91  soft-tissue]
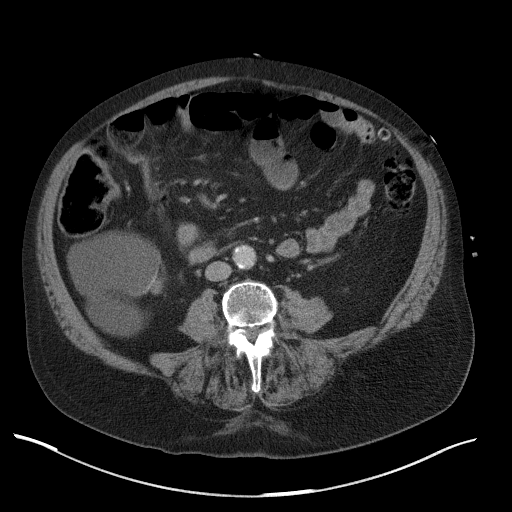
[im 53/91  soft-tissue]
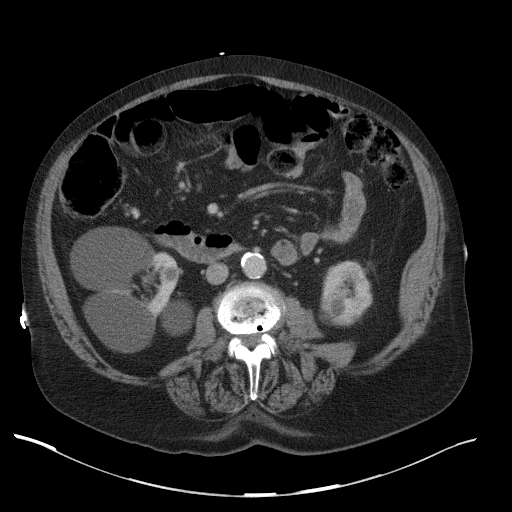
[im 53/91  bone]
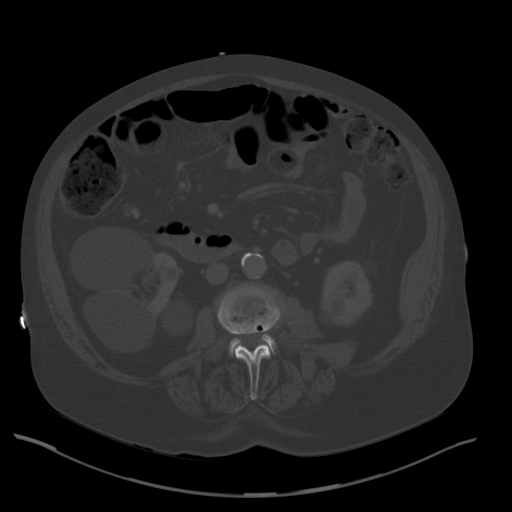
[im 62/91  soft-tissue]
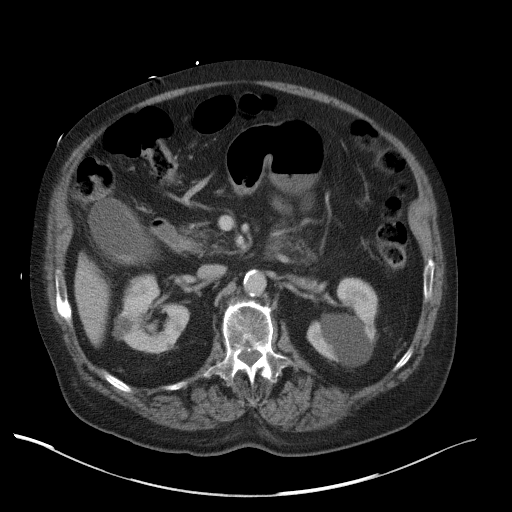
[im 67/91  soft-tissue]
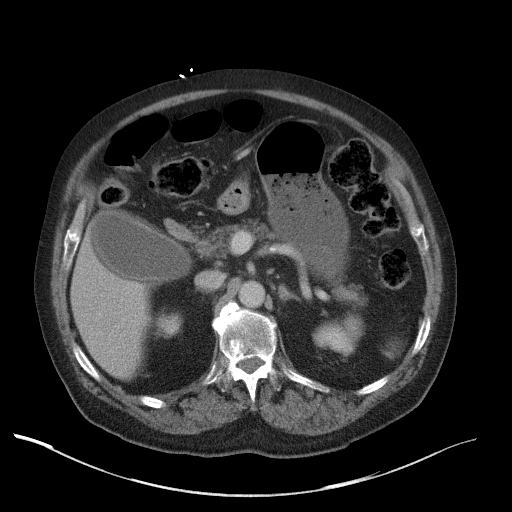
[im 72/91  soft-tissue]
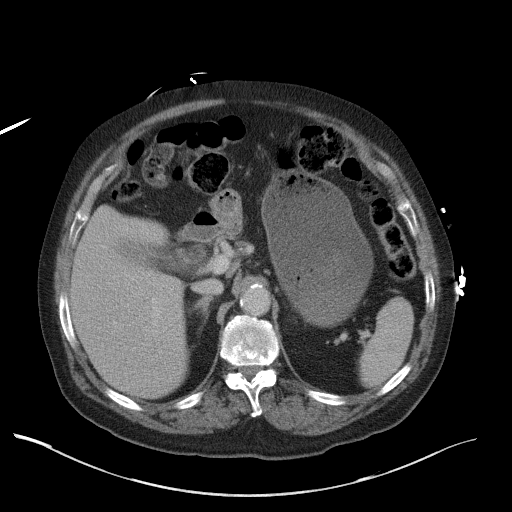
[im 81/91  soft-tissue]
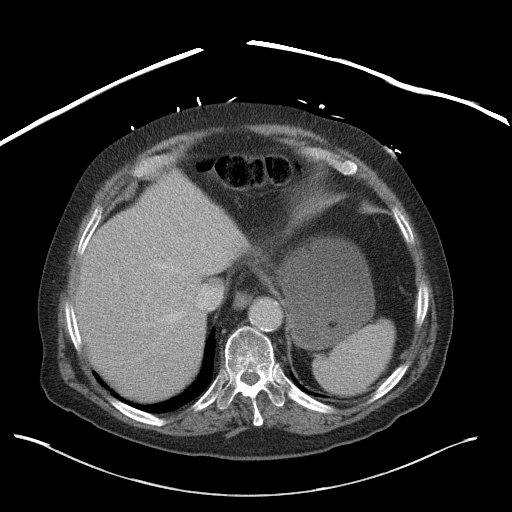
[im 86/91  soft-tissue]
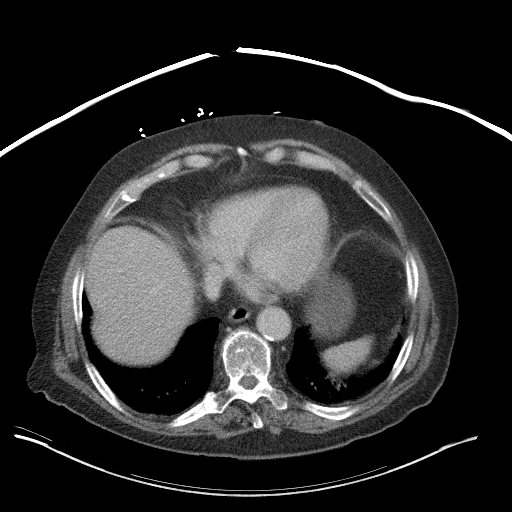

[Series 5: abd/pelvis 3.0 coronal · coronal · 0.83mm/px · 3 of 109 slices shown]
[im 37/109  soft-tissue]
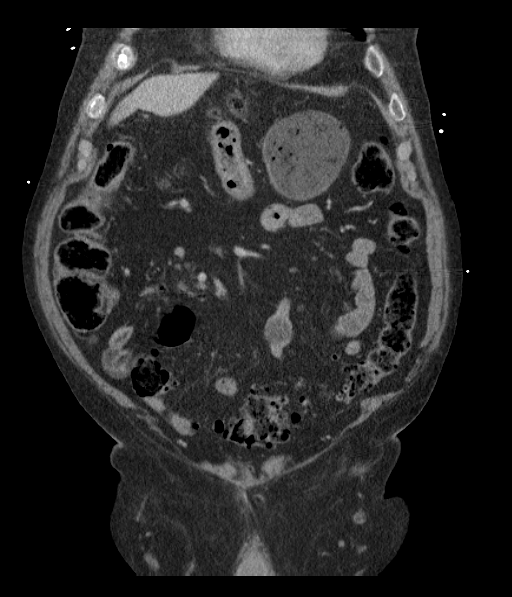
[im 49/109  soft-tissue]
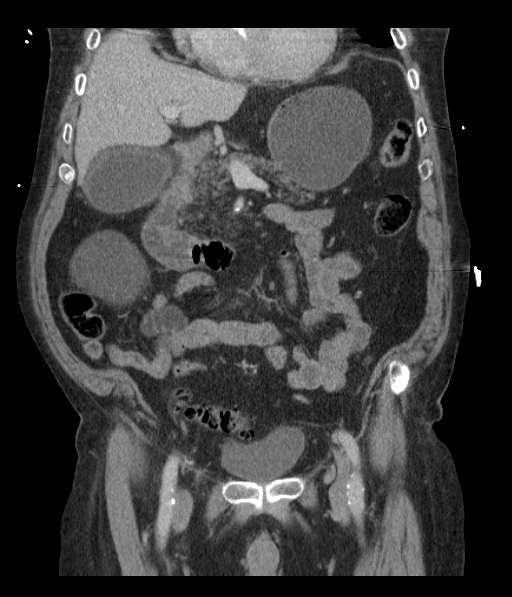
[im 61/109  soft-tissue]
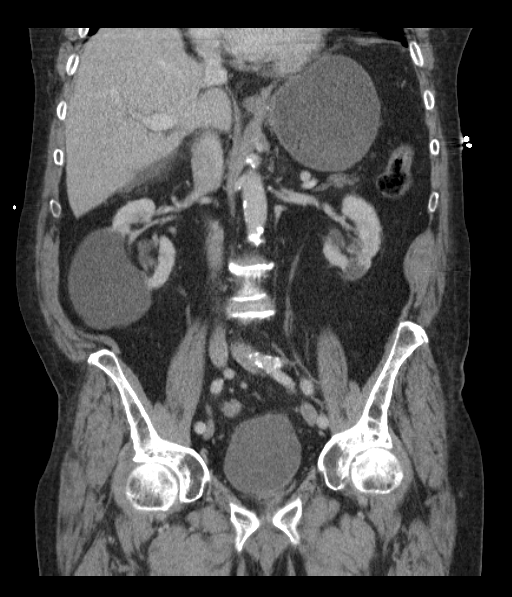

[17 of 46 positions shown; findings below may reference images not displayed]

FINDINGS: There is bibasilar atelectasis.  No effusions.  Heart is
upper limits normal in size.

There is mild gallbladder wall thickening and slight stranding
around the gallbladder.  There appears to be layering stones within
the gallbladder.  Findings are concerning for cholecystitis.
Recommend clinical correlation.  No biliary ductal dilatation.

Liver, spleen, adrenals unremarkable.  Numerous bilateral renal
cysts which appears simple.  No hydronephrosis or stones.  There is
fatty  replacement of the pancreas.

Descending colonic and sigmoid diverticulosis changes noted without
active diverticulitis.  Small bowel is decompressed.  Bladder is
unremarkable.  No free fluid or free air.

Degenerative changes throughout the thoracolumbar spine.  No acute
bony abnormality.
IMPRESSION: Gallbladder wall thickening with slight surrounding stranding and a
few layering gallstones present.  Findings are concerning for acute
cholecystitis.  Recommend clinical correlation.

Descending colonic and sigmoid diverticulosis.

## 2012-05-18 ENCOUNTER — Encounter: Payer: Self-pay | Admitting: Internal Medicine

## 2012-05-18 DIAGNOSIS — B029 Zoster without complications: Secondary | ICD-10-CM | POA: Insufficient documentation

## 2012-05-18 DIAGNOSIS — E785 Hyperlipidemia, unspecified: Secondary | ICD-10-CM | POA: Insufficient documentation

## 2012-05-18 DIAGNOSIS — N200 Calculus of kidney: Secondary | ICD-10-CM | POA: Insufficient documentation

## 2012-05-18 DIAGNOSIS — Z0000 Encounter for general adult medical examination without abnormal findings: Secondary | ICD-10-CM | POA: Insufficient documentation

## 2012-05-18 DIAGNOSIS — E669 Obesity, unspecified: Secondary | ICD-10-CM | POA: Insufficient documentation

## 2012-05-18 DIAGNOSIS — D649 Anemia, unspecified: Secondary | ICD-10-CM | POA: Insufficient documentation

## 2012-05-18 DIAGNOSIS — I350 Nonrheumatic aortic (valve) stenosis: Secondary | ICD-10-CM | POA: Insufficient documentation

## 2012-05-18 DIAGNOSIS — E039 Hypothyroidism, unspecified: Secondary | ICD-10-CM | POA: Insufficient documentation

## 2012-05-18 DIAGNOSIS — M1990 Unspecified osteoarthritis, unspecified site: Secondary | ICD-10-CM | POA: Insufficient documentation

## 2012-05-18 DIAGNOSIS — I2510 Atherosclerotic heart disease of native coronary artery without angina pectoris: Secondary | ICD-10-CM | POA: Insufficient documentation

## 2012-05-18 DIAGNOSIS — Z8546 Personal history of malignant neoplasm of prostate: Secondary | ICD-10-CM | POA: Insufficient documentation

## 2012-05-18 DIAGNOSIS — I480 Paroxysmal atrial fibrillation: Secondary | ICD-10-CM | POA: Insufficient documentation

## 2012-05-18 DIAGNOSIS — E119 Type 2 diabetes mellitus without complications: Secondary | ICD-10-CM | POA: Insufficient documentation

## 2012-05-18 DIAGNOSIS — J309 Allergic rhinitis, unspecified: Secondary | ICD-10-CM | POA: Insufficient documentation

## 2012-05-18 DIAGNOSIS — E538 Deficiency of other specified B group vitamins: Secondary | ICD-10-CM | POA: Insufficient documentation

## 2012-05-18 DIAGNOSIS — I6522 Occlusion and stenosis of left carotid artery: Secondary | ICD-10-CM | POA: Insufficient documentation

## 2012-05-18 DIAGNOSIS — Z9889 Other specified postprocedural states: Secondary | ICD-10-CM | POA: Insufficient documentation

## 2012-05-29 ENCOUNTER — Ambulatory Visit (INDEPENDENT_AMBULATORY_CARE_PROVIDER_SITE_OTHER): Payer: Medicare Other | Admitting: Internal Medicine

## 2012-05-29 ENCOUNTER — Encounter: Payer: Self-pay | Admitting: Internal Medicine

## 2012-05-29 VITALS — BP 94/60 | HR 76 | Temp 97.9°F | Ht 68.0 in | Wt 206.4 lb

## 2012-05-29 DIAGNOSIS — E213 Hyperparathyroidism, unspecified: Secondary | ICD-10-CM

## 2012-05-29 DIAGNOSIS — G459 Transient cerebral ischemic attack, unspecified: Secondary | ICD-10-CM | POA: Insufficient documentation

## 2012-05-29 DIAGNOSIS — R269 Unspecified abnormalities of gait and mobility: Secondary | ICD-10-CM

## 2012-05-29 DIAGNOSIS — G4733 Obstructive sleep apnea (adult) (pediatric): Secondary | ICD-10-CM

## 2012-05-29 DIAGNOSIS — E291 Testicular hypofunction: Secondary | ICD-10-CM

## 2012-05-29 DIAGNOSIS — I252 Old myocardial infarction: Secondary | ICD-10-CM | POA: Insufficient documentation

## 2012-05-29 DIAGNOSIS — F419 Anxiety disorder, unspecified: Secondary | ICD-10-CM | POA: Insufficient documentation

## 2012-05-29 DIAGNOSIS — IMO0002 Reserved for concepts with insufficient information to code with codable children: Secondary | ICD-10-CM | POA: Insufficient documentation

## 2012-05-29 DIAGNOSIS — E785 Hyperlipidemia, unspecified: Secondary | ICD-10-CM

## 2012-05-29 DIAGNOSIS — J449 Chronic obstructive pulmonary disease, unspecified: Secondary | ICD-10-CM

## 2012-05-29 DIAGNOSIS — F329 Major depressive disorder, single episode, unspecified: Secondary | ICD-10-CM | POA: Insufficient documentation

## 2012-05-29 DIAGNOSIS — E538 Deficiency of other specified B group vitamins: Secondary | ICD-10-CM

## 2012-05-29 DIAGNOSIS — E119 Type 2 diabetes mellitus without complications: Secondary | ICD-10-CM

## 2012-05-29 DIAGNOSIS — N184 Chronic kidney disease, stage 4 (severe): Secondary | ICD-10-CM

## 2012-05-29 DIAGNOSIS — D649 Anemia, unspecified: Secondary | ICD-10-CM

## 2012-05-29 DIAGNOSIS — I10 Essential (primary) hypertension: Secondary | ICD-10-CM

## 2012-05-29 DIAGNOSIS — M353 Polymyalgia rheumatica: Secondary | ICD-10-CM

## 2012-05-29 DIAGNOSIS — E039 Hypothyroidism, unspecified: Secondary | ICD-10-CM

## 2012-05-29 HISTORY — DX: Hyperparathyroidism, unspecified: E21.3

## 2012-05-29 HISTORY — DX: Chronic obstructive pulmonary disease, unspecified: J44.9

## 2012-05-29 HISTORY — DX: Chronic kidney disease, stage 4 (severe): N18.4

## 2012-05-29 HISTORY — DX: Testicular hypofunction: E29.1

## 2012-05-29 HISTORY — DX: Transient cerebral ischemic attack, unspecified: G45.9

## 2012-05-29 HISTORY — DX: Polymyalgia rheumatica: M35.3

## 2012-05-29 MED ORDER — CYANOCOBALAMIN 1000 MCG/ML IJ SOLN
1000.0000 ug | Freq: Once | INTRAMUSCULAR | Status: AC
  Administered 2012-05-29: 1000 ug via INTRAMUSCULAR

## 2012-05-29 MED ORDER — HYDRALAZINE HCL 25 MG PO TABS: ORAL_TABLET | ORAL | Status: DC

## 2012-05-29 NOTE — Assessment & Plan Note (Signed)
For testosterone check, consider replacement tx though would be risky with respect to prostate

## 2012-05-29 NOTE — Assessment & Plan Note (Signed)
To f/u rheum as planned, for esr today, no change in tx, off prednisone curretnly

## 2012-05-29 NOTE — Assessment & Plan Note (Signed)
stable overall by hx and exam, most recent data reviewed with pt, and pt to continue medical treatment as before BP Readings from Last 3 Encounters:  05/29/12 94/60  04/21/11 116/62  04/07/10 124/60

## 2012-05-29 NOTE — Assessment & Plan Note (Signed)
Due for f/u, suspect may be related to his somnolence - for pulm referral

## 2012-05-29 NOTE — Assessment & Plan Note (Signed)
stable overall by hx and exam, most recent data reviewed with pt, and pt to continue medical treatment as before Lab Results  Component Value Date   [facility]  Value: 92        Total Cholesterol/HDL:CHD Risk Coronary Heart Disease Risk Table                     Men   Women  1/2 Average Risk   3.4   3.3  Average Risk       5.0   4.4  2 X Average Risk   9.6   7.1  3 X Average Risk  23.4   11.0        Use the calculated Patient Ratio above and the CHD Risk Table to determine the patient's CHD Risk.        ATP III CLASSIFICATION (LDL):  <100     mg/dL   Optimal  161-096  mg/dL   Near or Above                    Optimal  130-159  mg/dL   Borderline  045-409  mg/dL   High  >811     mg/dL   Very High 08/25/4781

## 2012-05-29 NOTE — Assessment & Plan Note (Signed)
Complex hx and exam today, for home health eval/home PT eval and tx, declines referral to [facility] as he has family help at home  Note:  Time for pt hx, exam, review of record with pt in the room, determination of diagnosis and plan for further eval and tx is > 50 min

## 2012-05-29 NOTE — Assessment & Plan Note (Signed)
Diet controlled, for a1c check, cont same tx

## 2012-05-29 NOTE — Assessment & Plan Note (Signed)
For pTH level

## 2012-05-29 NOTE — Assessment & Plan Note (Signed)
For b12 shot today 

## 2012-05-29 NOTE — Assessment & Plan Note (Signed)
No overt bleeding, for hgb and iron today

## 2012-05-29 NOTE — Progress Notes (Signed)
Subjective:    Patient ID: [name], male    DOB: 02/25/1923, 78 y.o.   MRN: [id number]  HPI  Here as new pt with son who was concerned pt's condition too complex for the previous PCP on top of pt being obstinate and not wanting to be compliant with all recommendations, just the ones he wanted to do.  Due for b12 injection today.  Pt walks with cane, with some increased general weakness and some balance problem worsening recent.  Pt c/o worsening nonspecific symptoms in the past few months with difficultly walking, sleeping, dizziness, fatigue.  Has worsening daytime somnolence and sleeping most of the day,  Currently noncompliant with his CPAP he has been prescribed approx 4 yrs ago.  Does also have borderline low BP today.  Wt overall stable per pt and son.  Did have an unusual actual stumble and fall with a small skin tear near the right elbow incidentally yesterday, harder to stop the bleeding on the plavix but did stop.  No other bleeding or bruising.  No shoulder or other worsening joint pain lately, has been off the prednisone for about a yr, prior use for over a yr.  Has not seen cardiology recently but plans to f/u, as well as with rheum/dr Nickola Major, and urology/Dr Patsi Sears.   Past Medical History  Diagnosis Date  . CAD (coronary artery disease)     s/p NSTEMI and BMS stent diagonal07/09  . AS (aortic stenosis)     Echo 2009 mean gradient 9mm HG. AVA 2.18    Echo 4/11 mild AS mean gradient 10mm HG  . AF (paroxysmal atrial fibrillation)     Holter monitor 2.5 sec pauses  . Fatigue     CPX 11/09 VO2  14.4 (75% predicte)d, slope 35.  O2 pulse normal.  VO2 corrected for body weight 17.6.  Marland Kitchen Hypothyroidism   . Hyperlipidemia   . History of prostate cancer   . Allergic rhinitis   . DM (diabetes mellitus)     type II diet controlled  . Shingles   . Left carotid stenosis   . Vitamin B 12 deficiency   . Anemia   . Osteoarthritis   . Nephrolithiasis   . Obesity   . OSA (obstructive  sleep apnea)     AHI 15 PSG 09/06/08  . Hx of colonoscopy   . Kidney stones   . Stroke   . Depression   . PMR (polymyalgia rheumatica) 05/29/2012  . History of MI (myocardial infarction) 05/29/2012  . Hypogonadism male 05/29/2012  . Anxiety 05/29/2012  . Hyperparathyroidism 05/29/2012  . TIA (transient ischemic attack) 05/29/2012  . CKD (chronic kidney disease) stage 4, GFR 15-29 ml/min 05/29/2012  . COPD (chronic obstructive pulmonary disease) 05/29/2012  . OSA on CPAP 05/29/2012   Past Surgical History  Procedure Date  . Appendectomy   . Hernia repair   . Right carpal tunnel release   . Lumbar spine surgery   . Cystectomy     neck  . Prostatectomy   . Vasectomy   . Total knee arthroplasty     right  . Cholecystectomy     reports that he has quit smoking. He has never used smokeless tobacco. He reports that he does not drink alcohol or use illicit drugs. family history includes Alcohol abuse (age of onset:69) in his father; Coronary artery disease in an unspecified family member; and Stroke (age of onset:69) in his mother. Allergies  Allergen Reactions  . Horse-Derived Products  Current Outpatient Prescriptions on File Prior to Visit  Medication Sig Dispense Refill  . Ascorbic Acid (VITAMIN C) 500 MG tablet Take 500 mg by mouth daily.        . clopidogrel (PLAVIX) 75 MG tablet Take 75 mg by mouth daily.        . Cyanocobalamin (VITAMIN B-12 IJ) Inject as directed. Every 2 weeks       . cyclobenzaprine (FLEXERIL) 10 MG tablet Take 10 mg by mouth 3 (three) times daily as needed.        . furosemide (LASIX) 20 MG tablet Take 10 mg by mouth daily.        . hydrOXYzine (ATARAX) 10 MG tablet Take 10 mg by mouth 3 (three) times daily as needed.        Marland Kitchen levothyroxine (SYNTHROID, LEVOTHROID) 175 MCG tablet Take 137 mcg by mouth daily.       . solifenacin (VESICARE) 5 MG tablet Take 5 mg by mouth daily.        . traMADol (ULTRAM) 50 MG tablet Take 50 mg by mouth daily as needed.       Marland Kitchen  DISCONTD: hydrALAZINE (APRESOLINE) 25 MG tablet Take 25 mg by mouth 2 (two) times daily.         No current facility-administered medications on file prior to visit.   Review of Systems Constitutional: Negative for diaphoresis and unexpected weight change.  HENT: Negative for drooling and tinnitus.   Eyes: Negative for photophobia and visual disturbance.  Respiratory: Negative for choking and stridor.   Gastrointestinal: Negative for vomiting and blood in stool.  Genitourinary: Negative for hematuria and decreased urine volume.  Skin: Negative for color change and wound.  Neurological: Negative for tremors and numbness.  Psychiatric/Behavioral: Negative for decreased concentration. The patient is not hyperactive.      Objective:   Physical Exam BP 94/60  Pulse 76  Temp(Src) 97.9 F (36.6 C) (Oral)  Ht 5\' 8"  (1.727 m)  Wt 206 lb 6 oz (93.611 kg)  BMI 31.38 kg/m2  SpO2 94% Physical Exam  VS noted Constitutional: Pt appears well-developed and well-nourished.  HENT: Head: Normocephalic.  Right Ear: External ear normal.  Left Ear: External ear normal.  Eyes: Conjunctivae and EOM are normal. Pupils are equal, round, and reactive to light.  Neck: Normal range of motion. Neck supple.  Cardiovascular: Normal rate and regular rhythm.   Pulmonary/Chest: Effort normal and breath sounds normal.  Abd:  Soft, NT, non-distended, + BS Neurological: Pt is alert. No cranial nerve deficit, motor/dtr intact, walks with cane, somewhat unsteady.  Skin: Skin is warm. No erythema. No rash Small tear near right shoulder without red/swelling/tender Psychiatric: Pt behavior is normal. Thought content normal. ? Mild dysphoric    Assessment & Plan:

## 2012-05-29 NOTE — Assessment & Plan Note (Signed)
stable overall by hx and exam, most recent data reviewed with pt, and pt to continue medical treatment as before Lab Results  Component Value Date   TSH 2.764 10/28/2010

## 2012-05-29 NOTE — Patient Instructions (Addendum)
You had the B12 shot today Please return monthly for a Nurse Visit to continue the shots monthly Please return in the AM for your blood work You will be contacted by phone if any changes need to be made immediately.  Otherwise, you will receive a letter about your results with an explanation. Please make appts with Dr Nickola Major, Dr Teressa Lower, and Dr Patsi Sears if you do not already have appointments You will be contacted regarding the referral for: pulmonary at [place] for the sleep apnea You will be contacted regarding the referral for: [facility] and Home PT for the walking problem Please return in 3 months, or sooner if needed

## 2012-05-29 NOTE — Assessment & Plan Note (Signed)
Given fatigue, for cortisol AM eval tomorrow

## 2012-05-31 ENCOUNTER — Other Ambulatory Visit (INDEPENDENT_AMBULATORY_CARE_PROVIDER_SITE_OTHER): Payer: Medicare Other

## 2012-05-31 DIAGNOSIS — E291 Testicular hypofunction: Secondary | ICD-10-CM

## 2012-05-31 DIAGNOSIS — D649 Anemia, unspecified: Secondary | ICD-10-CM

## 2012-05-31 DIAGNOSIS — E785 Hyperlipidemia, unspecified: Secondary | ICD-10-CM

## 2012-05-31 DIAGNOSIS — I10 Essential (primary) hypertension: Secondary | ICD-10-CM

## 2012-05-31 DIAGNOSIS — IMO0002 Reserved for concepts with insufficient information to code with codable children: Secondary | ICD-10-CM

## 2012-05-31 DIAGNOSIS — E119 Type 2 diabetes mellitus without complications: Secondary | ICD-10-CM

## 2012-05-31 DIAGNOSIS — E039 Hypothyroidism, unspecified: Secondary | ICD-10-CM

## 2012-05-31 LAB — CBC WITH DIFFERENTIAL/PLATELET
Eosinophils Relative: 2.8 % (ref 0.0–5.0)
Lymphocytes Relative: 31.4 % (ref 12.0–46.0)
MCV: 102.4 fl — ABNORMAL HIGH (ref 78.0–100.0)
Monocytes Absolute: 0.8 10*3/uL (ref 0.1–1.0)
Neutrophils Relative %: 57.3 % (ref 43.0–77.0)
Platelets: 267 10*3/uL (ref 150.0–400.0)
RBC: 4.3 Mil/uL (ref 4.22–5.81)
WBC: 10 10*3/uL (ref 4.5–10.5)

## 2012-05-31 LAB — IBC PANEL
Iron: 86 ug/dL (ref 42–165)
Saturation Ratios: 29.3 % (ref 20.0–50.0)
Transferrin: 209.7 mg/dL — ABNORMAL LOW (ref 212.0–360.0)

## 2012-05-31 LAB — LIPID PANEL: Total CHOL/HDL Ratio: 2

## 2012-05-31 LAB — URINALYSIS, ROUTINE W REFLEX MICROSCOPIC
Hgb urine dipstick: NEGATIVE
Nitrite: NEGATIVE
Specific Gravity, Urine: 1.015 (ref 1.000–1.030)
Total Protein, Urine: NEGATIVE
Urobilinogen, UA: 0.2 (ref 0.0–1.0)

## 2012-05-31 LAB — HEPATIC FUNCTION PANEL
ALT: 14 U/L (ref 0–53)
AST: 20 U/L (ref 0–37)
Albumin: 3.3 g/dL — ABNORMAL LOW (ref 3.5–5.2)
Total Bilirubin: 0.6 mg/dL (ref 0.3–1.2)
Total Protein: 5.6 g/dL — ABNORMAL LOW (ref 6.0–8.3)

## 2012-05-31 LAB — BASIC METABOLIC PANEL
BUN: 24 mg/dL — ABNORMAL HIGH (ref 6–23)
CO2: 31 mEq/L (ref 19–32)
Creatinine, Ser: 1.5 mg/dL (ref 0.4–1.5)
Glucose, Bld: 83 mg/dL (ref 70–99)
Sodium: 141 mEq/L (ref 135–145)

## 2012-05-31 LAB — CORTISOL: Cortisol, Plasma: 7.8 ug/dL

## 2012-05-31 LAB — HEMOGLOBIN A1C: Hgb A1c MFr Bld: 5.9 % (ref 4.6–6.5)

## 2012-06-03 ENCOUNTER — Encounter: Payer: Self-pay | Admitting: Internal Medicine

## 2012-06-03 LAB — TESTOSTERONE, FREE, TOTAL, SHBG
Testosterone, Free: 9.1 pg/mL — ABNORMAL LOW (ref 47.0–244.0)
Testosterone-% Free: 1.3 % — ABNORMAL LOW (ref 1.6–2.9)
Testosterone: 69.65 ng/dL — ABNORMAL LOW (ref 300–890)

## 2012-06-12 ENCOUNTER — Ambulatory Visit (INDEPENDENT_AMBULATORY_CARE_PROVIDER_SITE_OTHER): Payer: Medicare Other | Admitting: *Deleted

## 2012-06-12 DIAGNOSIS — E538 Deficiency of other specified B group vitamins: Secondary | ICD-10-CM

## 2012-06-12 MED ORDER — CYANOCOBALAMIN 1000 MCG/ML IJ SOLN
1000.0000 ug | Freq: Once | INTRAMUSCULAR | Status: AC
  Administered 2012-06-12: 1000 ug via INTRAMUSCULAR

## 2012-06-18 ENCOUNTER — Ambulatory Visit: Payer: Medicare Other | Admitting: Internal Medicine

## 2012-06-25 ENCOUNTER — Encounter: Payer: Self-pay | Admitting: Pulmonary Disease

## 2012-06-25 ENCOUNTER — Ambulatory Visit (INDEPENDENT_AMBULATORY_CARE_PROVIDER_SITE_OTHER): Payer: Medicare Other | Admitting: Pulmonary Disease

## 2012-06-25 ENCOUNTER — Other Ambulatory Visit: Payer: Self-pay

## 2012-06-25 VITALS — BP 118/70 | HR 74 | Temp 97.6°F | Ht 68.0 in | Wt 201.6 lb

## 2012-06-25 DIAGNOSIS — G4733 Obstructive sleep apnea (adult) (pediatric): Secondary | ICD-10-CM

## 2012-06-25 MED ORDER — GABAPENTIN 300 MG PO CAPS: 300.0000 mg | ORAL_CAPSULE | Freq: Every day | ORAL | Status: DC

## 2012-06-25 MED ORDER — HYDROXYZINE HCL 10 MG PO TABS: 10.0000 mg | ORAL_TABLET | Freq: Three times a day (TID) | ORAL | Status: DC | PRN

## 2012-06-25 MED ORDER — PRAVASTATIN SODIUM 40 MG PO TABS: 40.0000 mg | ORAL_TABLET | Freq: Every day | ORAL | Status: DC

## 2012-06-25 NOTE — Patient Instructions (Addendum)
Try sleeping more upright, and staying off your back unless in recliner Work on weight loss Discuss with Dr. Jonny Ruiz possible better pain control at night. Please call if you decide to try cpap again.

## 2012-06-25 NOTE — Assessment & Plan Note (Signed)
The patient has a history of mild obstructive sleep apnea by his sleep study in 2009, and has actually lost 11 pounds since that time.  I suspect if anything, his sleep apnea is improved from that study.  The patient has many issues that can contribute to fragmented sleep and daytime sleepiness/fatigue.  Certainly his age contributes to this, chronic pain, as well as his known sleep apnea.  The patient is adamant that he does not want to try CPAP, and I do not think he is a good dental appliance or surgical candidate.  I have outlined some conservative measures for him such as weight loss and sleeping more up right, as well as positional therapy.  He did have a lot of leg movements on his sleep study, but has no history to suggest restless leg syndrome.  He does admit to a lot of joint pain in his lower extremities, and I suspect this is the cause of his limb movements.  I am more than willing to try and help him from a sleep apnea standpoint if he wishes to try CPAP.  He will call me if he changes his mind.  However, I have stressed to him that even with aggressive treatment of his sleep apnea, he has other issues that contributes to the disrupted sleep.  At least his level of sleep apnea is not a significant cardiovascular risk factor for him.

## 2012-06-25 NOTE — Progress Notes (Deleted)
  Subjective:    Patient ID: [name], male    DOB: Dec 02, 1923, 78 y.o.   MRN: [id number]  HPI    Review of Systems  Constitutional: Negative for fever, chills, diaphoresis, activity change, appetite change, fatigue and unexpected weight change.  HENT: Negative for hearing loss, ear pain, nosebleeds, congestion, sore throat, facial swelling, rhinorrhea, sneezing, mouth sores, trouble swallowing, neck pain, neck stiffness, dental problem, voice change, postnasal drip, sinus pressure, tinnitus and ear discharge.   Eyes: Negative for photophobia, discharge, redness, itching and visual disturbance.  Respiratory: Negative for apnea, cough, choking, chest tightness, shortness of breath, wheezing and stridor.   Cardiovascular: Negative for chest pain, palpitations and leg swelling.  Gastrointestinal: Negative for nausea, vomiting, abdominal pain, constipation, blood in stool and abdominal distention.       Heartburn Indigestion   Genitourinary: Negative for dysuria, urgency, frequency, hematuria, flank pain, decreased urine volume and difficulty urinating.  Musculoskeletal: Positive for arthralgias. Negative for myalgias, back pain, joint swelling and gait problem.  Skin: Negative for color change, pallor and rash.  Neurological: Negative for dizziness, tremors, seizures, syncope, speech difficulty, weakness, light-headedness, numbness and headaches.  Hematological: Negative for adenopathy. Does not bruise/bleed easily.  Psychiatric/Behavioral: Negative for confusion, disturbed wake/sleep cycle, dysphoric mood and agitation. The patient is nervous/anxious.   All other systems reviewed and are negative.       Objective:   Physical Exam        Assessment & Plan:

## 2012-06-25 NOTE — Progress Notes (Signed)
Subjective:    Patient ID: [name], male    DOB: 09/21/1923, 78 y.o.   MRN: [id number]  HPI The patient is an 78 year old male who I've been asked to see for management of obstructive sleep apnea.  He was diagnosed with mild sleep apnea in 2009, with an AHI of 15 events per hour.  He was also noted to have large numbers of leg jerks with mild sleep disruption.  The patient was started on CPAP, however had very poor tolerance and did not followup.  The patient felt that he slept better without the device, and was not willing to deal with the inconveniences.  He currently states that he is not willing to try again.  The patient is unsure if he snores, and denies choking arousals.  He has no issues with sleep onset, but has very frequent awakenings.  He feels rested only about 50% of the mornings, and does admit to significant sleep pressure with dozing during the day.  He denies any sleepiness with driving.  The patient denies any symptoms that would suggest the restless leg syndrome, but rather complains of significant joint pain in his knees and shoulders.  His weight is actually down 11 pounds from his sleep study in 2009.  The patient has significant fatigue as well as his sleepiness during the day, and his Epworth score is 19.  Sleep Questionnaire: What time do you typically go to bed?( Between what hours) 9-9:30 pm How long does it take you to fall asleep? varies How many times during the night do you wake up? 6 What time do you get out of bed to start your day? 0900 Do you drive or operate heavy machinery in your occupation? Yes How much has your weight changed (up or down) over the past two years? (In pounds) 5 lb (2.268 kg) Have you ever had a sleep study before? Yes If yes, location of study? [facility] If yes, date of study? 2009 Do you currently use CPAP? No If so, what pressure? Do you wear oxygen at any time? No     Review of Systems  Constitutional: Negative for fever and unexpected  weight change.  HENT: Negative for ear pain, nosebleeds, congestion, sore throat, rhinorrhea, sneezing, trouble swallowing, dental problem, postnasal drip and sinus pressure.   Eyes: Negative for redness and itching.  Respiratory: Negative for cough, chest tightness, shortness of breath and wheezing.   Cardiovascular: Negative for palpitations and leg swelling.  Gastrointestinal: Negative for nausea and vomiting.  Genitourinary: Negative for dysuria.  Musculoskeletal: Positive for arthralgias. Negative for joint swelling.  Skin: Negative for rash.  Neurological: Negative for headaches.  Hematological: Does not bruise/bleed easily.  Psychiatric/Behavioral: Negative for dysphoric mood. The patient is nervous/anxious.   All other systems reviewed and are negative.       Objective:   Physical Exam Constitutional:  Obese male, no acute distress  HENT:  Nares patent without discharge, but narrowed.  Oropharynx without exudate, palate and uvula are moderately elongated.   Eyes:  Perrla, eomi, no scleral icterus  Neck:  No JVD, no TMG  Cardiovascular:  Normal rate, regular rhythm, no rubs or gallops.  2/6 sem        Intact distal pulses, but decreased.  Pulmonary :  Normal breath sounds, no stridor or respiratory distress   No rales, rhonchi, or wheezing  Abdominal:  Soft, nondistended, bowel sounds present.  No tenderness noted.   Musculoskeletal:  mild lower extremity edema noted.  Lymph Nodes:  No cervical lymphadenopathy noted  Skin:  No cyanosis noted  Neurologic:  Appears mildly sleepy, appropriate, moves all 4 extremities without obvious deficit.         Assessment & Plan:

## 2012-06-26 ENCOUNTER — Ambulatory Visit: Payer: Medicare Other

## 2012-09-03 ENCOUNTER — Encounter: Payer: Self-pay | Admitting: Internal Medicine

## 2012-09-03 ENCOUNTER — Ambulatory Visit (INDEPENDENT_AMBULATORY_CARE_PROVIDER_SITE_OTHER): Payer: Medicare Other | Admitting: Internal Medicine

## 2012-09-03 ENCOUNTER — Other Ambulatory Visit (INDEPENDENT_AMBULATORY_CARE_PROVIDER_SITE_OTHER): Payer: Medicare Other

## 2012-09-03 VITALS — BP 108/72 | HR 88 | Temp 99.1°F | Ht 68.0 in | Wt 200.0 lb

## 2012-09-03 DIAGNOSIS — R509 Fever, unspecified: Secondary | ICD-10-CM

## 2012-09-03 DIAGNOSIS — Z23 Encounter for immunization: Secondary | ICD-10-CM

## 2012-09-03 DIAGNOSIS — R5381 Other malaise: Secondary | ICD-10-CM

## 2012-09-03 DIAGNOSIS — R5383 Other fatigue: Secondary | ICD-10-CM

## 2012-09-03 DIAGNOSIS — I480 Paroxysmal atrial fibrillation: Secondary | ICD-10-CM

## 2012-09-03 DIAGNOSIS — I4891 Unspecified atrial fibrillation: Secondary | ICD-10-CM

## 2012-09-03 DIAGNOSIS — E291 Testicular hypofunction: Secondary | ICD-10-CM

## 2012-09-03 LAB — URINALYSIS, ROUTINE W REFLEX MICROSCOPIC
Hgb urine dipstick: NEGATIVE
Ketones, ur: NEGATIVE
Urine Glucose: NEGATIVE
Urobilinogen, UA: 0.2 (ref 0.0–1.0)

## 2012-09-03 NOTE — Assessment & Plan Note (Addendum)
Not eligible for testosterone with hx of prostate ca, d/w pt - would not pursue tx

## 2012-09-03 NOTE — Patient Instructions (Addendum)
You had the flu shot today Please go to LAB in the Basement for the urine test to be done today Continue all other medications as before You will be contacted regarding the referral for: Cardiology - Dr Teressa Lower Please return in 6 months, or sooner if needed

## 2012-09-03 NOTE — Assessment & Plan Note (Signed)
Low grade, ? Clinical signficance, has urinary incontinence,  Denies urinary symptoms such as dysuria, frequency, urgency,or hematuria, but will check UA today, exam o/w benign

## 2012-09-03 NOTE — Progress Notes (Signed)
Subjective:    Patient ID: [name], male    DOB: 12-Feb-1923, 78 y.o.   MRN: [id number]  HPI   C/o fatigue such sa to the mailbox and back, approx 200 ft, gets worn out, has to sit, can only work around the shop for 15 min before sitting, but can mow the grass on the riding lawn mower 1.5 acres without stopping and not overly tired when done.   Does get dizzy on waling occas, worse to walk outside from the shade to the sun, but Pt denies chest pain, increased sob or doe, wheezing, orthopnea, PND, increased LE swelling, palpitations, or syncope.  No LE claudication.   Pt denies fever, wt loss, night sweats, loss of appetite, or other constitutional symptoms., unaware of any fever today.  Does take hydralazine and Overall good compliance with treatment.  Last card appt April 2012, recent one canceled accidentally per pt as there was confusion with the coordinator at his facility.  Does have occasional urinary leakage, worse in the AM to first get up, s/p prostatectomy for prostate cancer.  Quit smoking >50 yrs ago. Denies worsening depressive symptoms, suicidal ideation, or panic, though has ongoing anxiety Past Medical History  Diagnosis Date  . CAD (coronary artery disease)     s/p NSTEMI and BMS stent diagonal07/09  . AS (aortic stenosis)     Echo 2009 mean gradient 9mm HG. AVA 2.18    Echo 4/11 mild AS mean gradient 10mm HG  . AF (paroxysmal atrial fibrillation)     Holter monitor 2.5 sec pauses  . Fatigue     CPX 11/09 VO2  14.4 (75% predicte)d, slope 35.  O2 pulse normal.  VO2 corrected for body weight 17.6.  Marland Kitchen Hypothyroidism   . Hyperlipidemia   . History of prostate cancer   . Allergic rhinitis   . DM (diabetes mellitus)     type II diet controlled  . Shingles   . Left carotid stenosis   . Vitamin B 12 deficiency   . Anemia   . Osteoarthritis   . Nephrolithiasis   . Obesity   . OSA (obstructive sleep apnea)     AHI 15 PSG 09/06/08  . Hx of colonoscopy   . Kidney stones     . Stroke   . Depression   . PMR (polymyalgia rheumatica) 05/29/2012  . History of MI (myocardial infarction) 05/29/2012  . Hypogonadism male 05/29/2012  . Anxiety 05/29/2012  . Hyperparathyroidism 05/29/2012  . TIA (transient ischemic attack) 05/29/2012  . CKD (chronic kidney disease) stage 4, GFR 15-29 ml/min 05/29/2012  . COPD (chronic obstructive pulmonary disease) 05/29/2012  . OSA on CPAP 05/29/2012   Past Surgical History  Procedure Date  . Appendectomy   . Hernia repair   . Right carpal tunnel release   . Lumbar spine surgery   . Cystectomy     neck  . Prostatectomy   . Vasectomy   . Total knee arthroplasty     right  . Cholecystectomy     reports that he quit smoking about 53 years ago. His smoking use included Cigarettes. He has a 12.5 pack-year smoking history. He has quit using smokeless tobacco. He reports that he does not drink alcohol or use illicit drugs. family history includes Alcohol abuse (age of onset:69) in his father; Coronary artery disease in an unspecified family member; and Stroke (age of onset:69) in his mother. Allergies  Allergen Reactions  . Horse-Derived Products    Current Outpatient Prescriptions  on File Prior to Visit  Medication Sig Dispense Refill  . Ascorbic Acid (VITAMIN C) 500 MG tablet Take 500 mg by mouth daily.        . Cholecalciferol (VITAMIN D) 400 UNITS capsule Take 400 Units by mouth daily.      . clopidogrel (PLAVIX) 75 MG tablet Take 75 mg by mouth daily.        . Cyanocobalamin (VITAMIN B-12 IJ) Inject as directed. Every 2 weeks       . cyclobenzaprine (FLEXERIL) 10 MG tablet Take 10 mg by mouth 3 (three) times daily as needed.        . furosemide (LASIX) 20 MG tablet Take 10 mg by mouth daily.        Marland Kitchen gabapentin (NEURONTIN) 300 MG capsule Take 1 capsule (300 mg total) by mouth at bedtime.  30 capsule  5  . hydrALAZINE (APRESOLINE) 25 MG tablet 1/2 tab by mouth twice per day  90 tablet  3  . hydrOXYzine (ATARAX/VISTARIL) 10 MG tablet Take 1  tablet (10 mg total) by mouth 3 (three) times daily as needed.  90 tablet  1  . hyoscyamine (LEVSIN, ANASPAZ) 0.125 MG tablet Take 0.125 mg by mouth every 4 (four) hours as needed.      Marland Kitchen levothyroxine (SYNTHROID, LEVOTHROID) 137 MCG tablet Take 137 mcg by mouth daily.      . methylPREDNISolone (MEDROL) 4 MG tablet Use as directed      . pravastatin (PRAVACHOL) 40 MG tablet Take 1 tablet (40 mg total) by mouth daily.  30 tablet  5  . solifenacin (VESICARE) 5 MG tablet Take 5 mg by mouth daily.        . traMADol (ULTRAM) 50 MG tablet Take 50 mg by mouth daily as needed.       . traZODone (DESYREL) 100 MG tablet Take 100 mg by mouth at bedtime.       Review of Systems  Constitutional: Negative for diaphoresis and unexpected weight change.  HENT: Negative for tinnitus.   Eyes: Negative for photophobia and visual disturbance.  Respiratory: Negative for choking and stridor.   Gastrointestinal: Negative for vomiting and blood in stool.  Genitourinary: Negative for hematuria and decreased urine volume.  Musculoskeletal: Negative for gait problem.  Skin: Negative for color change and wound.  Neurological: Negative for tremors and numbness.  Psychiatric/Behavioral: Negative for decreased concentration. The patient is not hyperactive.      Objective:   Physical Exam BP 108/72  Pulse 88  Temp 99.1 F (37.3 C) (Oral)  Ht 5\' 8"  (1.727 m)  Wt 200 lb (90.719 kg)  BMI 30.41 kg/m2  SpO2 95% Physical Exam  VS noted Constitutional: Pt appears well-developed and well-nourished.  HENT: Head: Normocephalic.  Right Ear: External ear normal.  Left Ear: External ear normal.  Eyes: Conjunctivae and EOM are normal. Pupils are equal, round, and reactive to light.  Neck: Normal range of motion. Neck supple.  Cardiovascular: Normal rate and regular rhythm.   Pulmonary/Chest: Effort normal and breath sounds normal.  Abd:  Soft, NT, non-distended, + BS, no flank tender Neurological: Pt is alert. Not  confused  Skin: Skin is warm. No erythema. No rash Psychiatric: Pt behavior is normal. Thought content normal.     Assessment & Plan:

## 2012-09-03 NOTE — Assessment & Plan Note (Addendum)
Ongoing likely multifactorial, with CV dz, copd, hypogonadism, geriatric declines likely related;  Also has relative hypotension - ? Need less hydralazine - for card eval as per afib  Note:  Total time for pt hx, exam, review of record with pt in the room, determination of diagnoses and plan for further eval and tx is > 40 min, with over 50% spent in coordination and counseling of patient

## 2012-09-03 NOTE — Assessment & Plan Note (Addendum)
Overall stable it seems - regular rhythm by exam, ecg not done, but last seen April 2012 per cardiology,,  to f/u any worsening symptoms or concerns, BP somewhat low today, cont med as is for now, refer card as he is due

## 2012-09-08 ENCOUNTER — Encounter: Payer: Self-pay | Admitting: Internal Medicine

## 2012-09-09 ENCOUNTER — Ambulatory Visit (INDEPENDENT_AMBULATORY_CARE_PROVIDER_SITE_OTHER): Payer: Medicare Other | Admitting: Physician Assistant

## 2012-09-09 ENCOUNTER — Encounter: Payer: Self-pay | Admitting: Physician Assistant

## 2012-09-09 VITALS — BP 138/90 | HR 84 | Ht 68.0 in | Wt 203.0 lb

## 2012-09-09 DIAGNOSIS — I359 Nonrheumatic aortic valve disorder, unspecified: Secondary | ICD-10-CM

## 2012-09-09 DIAGNOSIS — I2510 Atherosclerotic heart disease of native coronary artery without angina pectoris: Secondary | ICD-10-CM

## 2012-09-09 DIAGNOSIS — I4891 Unspecified atrial fibrillation: Secondary | ICD-10-CM

## 2012-09-09 DIAGNOSIS — I350 Nonrheumatic aortic (valve) stenosis: Secondary | ICD-10-CM

## 2012-09-09 DIAGNOSIS — I10 Essential (primary) hypertension: Secondary | ICD-10-CM

## 2012-09-09 DIAGNOSIS — R5383 Other fatigue: Secondary | ICD-10-CM

## 2012-09-09 DIAGNOSIS — R5381 Other malaise: Secondary | ICD-10-CM

## 2012-09-09 NOTE — Patient Instructions (Addendum)
Your physician has requested that you have a MYOCARDIAL PERFUSION IMAGING ( lexiscan myoview). For further information please visit [email or web address] Please follow instruction sheet, as given. DX FATIGUE, CAD   Your physician has requested that you have an echocardiogram DX AORTIC STENOSIS. Echocardiography is a painless test that uses sound waves to create images of your heart. It provides your doctor with information about the size and shape of your heart and how well your heart's chambers and valves are working. This procedure takes approximately one hour. There are no restrictions for this procedure.  PLEASE REFER TO DR. Excell Seltzer, PREVIOUS PT OF DR. Gala Romney; CAN SEE DR. Excell Seltzer IN 6 MONTHS

## 2012-09-09 NOTE — Progress Notes (Signed)
[address] [place], [place]  [address] Phone: [phone] Fax:  [phone]  Date:  09/09/2012   Name:  [name]   DOB:  02/28/1923   MRN:  [id number]  PCP:  [name], MD  Primary Cardiologist:  Previously Dr. Gala Romney => Will establish with Dr. Tonny Bollman  Primary Electrophysiologist:  None    History of Present Illness: [name] is a 78 y.o. male who returns for follow up.  He has a history of CAD, status post non-STEMI in 7/09 treatment and BMS to the diagonal, mild aortic stenosis, HTN, DM2, paroxysmal atrial fibrillation, chronic kidney disease. He has had a history of sick sinus syndrome in the past. He is not on Coumadin secondary to a history bleeding problems (epistaxis requiring hospitalization). He's also had acute renal failure and hyperkalemia in the setting of diuretic use and diclofenac. CPX testing in 11/09 was overall notable for preserved functional capacity.  Patient has a history of TIA in 1/12. Carotid Dopplers demonstrated 0-39% stenosis bilaterally. He had been kept on Plavix.  LHC 7/09: Proximal LAD 25%, mid LAD 25%, D1 subtotally occluded proximally, proximal AV circumflex 25%, then 40%, ostial OM1 25%, ostial OM2 40%, proximal/mid RCA 25%, EF 50%. PCI: BMS to the diagonal of the LAD. Echo 04/2011: Mild LVH, EF 55-60%, grade 1 diastolic dysfunction, mild aortic stenosis, mean gradient 14, mild LAE.  Last seen by Dr. Gala Romney 03/2011. Beta blocker was stopped at that time due to bradycardia and fatigue. Fatigue is a chronic problem for him. He his exercise tolerance is worse. He recently saw his PCP who suggested a followup with cardiology. He denies chest discomfort. He denies significantly changing dyspnea with exertion. He denies syncope. He denies orthopnea, PND or edema.  Wt Readings from Last 3 Encounters:  09/09/12 203 lb (92.08 kg)  09/03/12 200 lb (90.719 kg)  06/25/12 201 lb 9.6 oz (91.445 kg)     Past  Medical History  Diagnosis Date  . CAD (coronary artery disease)     s/p NSTEMI and BMS stent diagonal07/09  . AS (aortic stenosis)     Echo 2009 mean gradient 9mm HG. AVA 2.18    Echo 4/11 mild AS mean gradient 10mm HG  . AF (paroxysmal atrial fibrillation)     Holter monitor 2.5 sec pauses  . Fatigue     CPX 11/09 VO2  14.4 (75% predicte)d, slope 35.  O2 pulse normal.  VO2 corrected for body weight 17.6.  Marland Kitchen Hypothyroidism   . Hyperlipidemia   . History of prostate cancer   . Allergic rhinitis   . DM (diabetes mellitus)     type II diet controlled  . Shingles   . Left carotid stenosis   . Vitamin B 12 deficiency   . Anemia   . Osteoarthritis   . Nephrolithiasis   . Obesity   . OSA (obstructive sleep apnea)     AHI 15 PSG 09/06/08  . Hx of colonoscopy   . Kidney stones   . Stroke   . Depression   . PMR (polymyalgia rheumatica) 05/29/2012  . History of MI (myocardial infarction) 05/29/2012  . Hypogonadism male 05/29/2012  . Anxiety 05/29/2012  . Hyperparathyroidism 05/29/2012  . TIA (transient ischemic attack) 05/29/2012  . CKD (chronic kidney disease) stage 4, GFR 15-29 ml/min 05/29/2012  . COPD (chronic obstructive pulmonary disease) 05/29/2012  . OSA on CPAP 05/29/2012    Current Outpatient Prescriptions  Medication Sig Dispense Refill  .  Ascorbic Acid (VITAMIN C) 500 MG tablet Take 500 mg by mouth daily.        . Cholecalciferol (VITAMIN D) 400 UNITS capsule Take 400 Units by mouth daily.      . clopidogrel (PLAVIX) 75 MG tablet Take 75 mg by mouth daily.        . Cyanocobalamin (VITAMIN B-12 IJ) Inject as directed. Every 2 weeks       . cyclobenzaprine (FLEXERIL) 10 MG tablet Take 10 mg by mouth 3 (three) times daily as needed.        . furosemide (LASIX) 20 MG tablet Take 10 mg by mouth daily.        Marland Kitchen gabapentin (NEURONTIN) 300 MG capsule Take 1 capsule (300 mg total) by mouth at bedtime.  30 capsule  5  . hydrALAZINE (APRESOLINE) 25 MG tablet 1/2 tab by mouth twice per day  90  tablet  3  . hydrOXYzine (ATARAX/VISTARIL) 10 MG tablet Take 1 tablet (10 mg total) by mouth 3 (three) times daily as needed.  90 tablet  1  . hyoscyamine (LEVSIN, ANASPAZ) 0.125 MG tablet Take 0.125 mg by mouth every 4 (four) hours as needed.      Marland Kitchen levothyroxine (SYNTHROID, LEVOTHROID) 137 MCG tablet Take 137 mcg by mouth daily.      . methylPREDNISolone (MEDROL) 4 MG tablet Use as directed      . pravastatin (PRAVACHOL) 40 MG tablet Take 1 tablet (40 mg total) by mouth daily.  30 tablet  5  . solifenacin (VESICARE) 5 MG tablet Take 5 mg by mouth daily.        . traMADol (ULTRAM) 50 MG tablet Take 50 mg by mouth daily as needed.       . traZODone (DESYREL) 100 MG tablet Take 100 mg by mouth at bedtime.        Allergies: Allergies  Allergen Reactions  . Horse-Derived Products     History  Substance Use Topics  . Smoking status: Former Smoker -- 0.5 packs/day for 25 years    Types: Cigarettes    Quit date: 12/25/1958  . Smokeless tobacco: Former Neurosurgeon   Comment: quit in 1950s  . Alcohol Use: No     ROS:  Please see the history of present illness.     All other systems reviewed and negative.   PHYSICAL EXAM: VS:  BP 138/90  Pulse 84  Ht 5\' 8"  (1.727 m)  Wt 203 lb (92.08 kg)  BMI 30.87 kg/m2 Well nourished, well developed, in no acute distress HEENT: normal Neck: no JVD Cardiac:  normal S1, S2; RRR; no murmur Lungs:  clear to auscultation bilaterally, no wheezing, rhonchi or rales Abd: soft, nontender, no hepatomegaly Ext: no edema Skin: warm and dry Neuro:  CNs 2-12 intact, no focal abnormalities noted  EKG:  Sinus rhythm, heart rate 84, leftward axis, no ischemic changes      ASSESSMENT AND PLAN:  1. Coronary Artery Disease: He has a symptom of fatigue. This is a fairly chronic symptom. He actually had a cardiopulmonary stress test at some point in the past that demonstrated normal functional capacity. His exercise tolerance is decreased. He has not had a followup  stress test since his MI in 2009. He is somewhat limited by knee pain and does not feel as though he can walk on a treadmill. Therefore, I will set him up for a Lexiscan Myoview. He was previously followed by Dr. Gala Romney. Dr. Excell Seltzer performed his PCI. As Dr. Gala Romney  is now on the CHF service, I will have him followup in the future with Dr. Excell Seltzer in 6 months.  Of note he is on Plavix only.  Given his prior TIA, I would suggest he remain on Plavix.    2. Atrial Fibrillation:  No recurrence.  He is not a coumadin candidate.    3. Aortic Stenosis:  Obtain follow up echo.  4. Hypertension:  Fair control.  Continue current Rx.  5. Hyperlipidemia:  Managed by PCP.   Luna Glasgow, PA-C  5:24 PM 09/09/2012

## 2012-09-12 ENCOUNTER — Telehealth: Payer: Self-pay | Admitting: Internal Medicine

## 2012-09-12 MED ORDER — LEVOTHYROXINE SODIUM 137 MCG PO TABS: 137.0000 ug | ORAL_TABLET | Freq: Every day | ORAL | Status: DC

## 2012-09-12 MED ORDER — CLOPIDOGREL BISULFATE 75 MG PO TABS: 75.0000 mg | ORAL_TABLET | Freq: Every day | ORAL | Status: DC

## 2012-09-12 NOTE — Telephone Encounter (Signed)
Sent requested prescriptions to the pharmacy and informed the family as well.

## 2012-09-12 NOTE — Telephone Encounter (Signed)
Caller: Gerline Legacy; Patient Name: [name]; PCP: [name] (Adults only); Best Callback Phone Number: [phone]. Caller asking for refills of Levothyroxine 50 mcg 1 po q day, and Plavix 75 mg 1 po q day. He usually gets #90 of these drugs. He was last seen in office on 09/03/12 and requested medications are in his medication list in EP[facility]. They were last prescribed by Dr Gildardo Cranker. Caller would like medications called to [facility] in [place]. She can be reached at above number if needed.

## 2012-09-16 ENCOUNTER — Telehealth: Payer: Self-pay | Admitting: *Deleted

## 2012-09-16 ENCOUNTER — Encounter: Payer: Self-pay | Admitting: Physician Assistant

## 2012-09-16 ENCOUNTER — Ambulatory Visit (HOSPITAL_COMMUNITY): Payer: Medicare Other | Attending: Cardiology | Admitting: Radiology

## 2012-09-16 DIAGNOSIS — J449 Chronic obstructive pulmonary disease, unspecified: Secondary | ICD-10-CM | POA: Insufficient documentation

## 2012-09-16 DIAGNOSIS — E119 Type 2 diabetes mellitus without complications: Secondary | ICD-10-CM | POA: Insufficient documentation

## 2012-09-16 DIAGNOSIS — I4891 Unspecified atrial fibrillation: Secondary | ICD-10-CM | POA: Insufficient documentation

## 2012-09-16 DIAGNOSIS — J4489 Other specified chronic obstructive pulmonary disease: Secondary | ICD-10-CM | POA: Insufficient documentation

## 2012-09-16 DIAGNOSIS — R5383 Other fatigue: Secondary | ICD-10-CM

## 2012-09-16 DIAGNOSIS — I2510 Atherosclerotic heart disease of native coronary artery without angina pectoris: Secondary | ICD-10-CM | POA: Insufficient documentation

## 2012-09-16 DIAGNOSIS — I350 Nonrheumatic aortic (valve) stenosis: Secondary | ICD-10-CM

## 2012-09-16 DIAGNOSIS — I359 Nonrheumatic aortic valve disorder, unspecified: Secondary | ICD-10-CM

## 2012-09-16 DIAGNOSIS — I10 Essential (primary) hypertension: Secondary | ICD-10-CM | POA: Insufficient documentation

## 2012-09-16 DIAGNOSIS — I369 Nonrheumatic tricuspid valve disorder, unspecified: Secondary | ICD-10-CM | POA: Insufficient documentation

## 2012-09-16 NOTE — Telephone Encounter (Signed)
pt notified about echo results w/verbal understanding today 

## 2012-09-16 NOTE — Telephone Encounter (Signed)
Message copied by Tarri Fuller on Mon Sep 16, 2012  5:16 PM ------      Message from: [place], [place] T      Created: Mon Sep 16, 2012  5:11 PM       Normal LV function      No significant change in mild to moderate aortic stenosis.      Tereso Newcomer, PA-C  5:11 PM 09/16/2012

## 2012-09-16 NOTE — Progress Notes (Signed)
Echocardiogram performed.  

## 2012-09-19 ENCOUNTER — Ambulatory Visit (HOSPITAL_COMMUNITY): Payer: Medicare Other | Attending: Cardiovascular Disease | Admitting: Radiology

## 2012-09-19 VITALS — BP 118/73 | Ht 68.0 in | Wt 202.0 lb

## 2012-09-19 DIAGNOSIS — R5383 Other fatigue: Secondary | ICD-10-CM

## 2012-09-19 DIAGNOSIS — I2510 Atherosclerotic heart disease of native coronary artery without angina pectoris: Secondary | ICD-10-CM

## 2012-09-19 DIAGNOSIS — I350 Nonrheumatic aortic (valve) stenosis: Secondary | ICD-10-CM

## 2012-09-19 DIAGNOSIS — E119 Type 2 diabetes mellitus without complications: Secondary | ICD-10-CM | POA: Insufficient documentation

## 2012-09-19 DIAGNOSIS — R5381 Other malaise: Secondary | ICD-10-CM | POA: Insufficient documentation

## 2012-09-19 DIAGNOSIS — Z87891 Personal history of nicotine dependence: Secondary | ICD-10-CM | POA: Insufficient documentation

## 2012-09-19 MED ORDER — REGADENOSON 0.4 MG/5ML IV SOLN
0.4000 mg | Freq: Once | INTRAVENOUS | Status: AC
  Administered 2012-09-19: 0.4 mg via INTRAVENOUS

## 2012-09-19 MED ORDER — TECHNETIUM TC 99M SESTAMIBI GENERIC - CARDIOLITE
11.0000 | Freq: Once | INTRAVENOUS | Status: AC | PRN
  Administered 2012-09-19: 11 via INTRAVENOUS

## 2012-09-19 MED ORDER — TECHNETIUM TC 99M SESTAMIBI GENERIC - CARDIOLITE
33.0000 | Freq: Once | INTRAVENOUS | Status: AC | PRN
  Administered 2012-09-19: 33 via INTRAVENOUS

## 2012-09-19 NOTE — Progress Notes (Signed)
[facility] SITE 3 NUCLEAR MED [address] [place] [address] [phone]  Cardiology Nuclear Med Study  [name] is a 78 y.o. male     MRN : [id number]     DOB: 1923-07-22  Procedure Date: 09/19/2012  Nuclear Med Background Indication for Stress Test:  Evaluation for Ischemia and Stent Patency History:  2012 Echo EF 55-60%, 2009 MI Stent LAD, EF 50% Cardiac Risk Factors: History of Smoking, Lipids, NIDDM and TIA  Symptoms:  Fatigue   Nuclear Pre-Procedure Caffeine/Decaff Intake:  None NPO After: 9:00pm   Lungs:  clear O2 Sat: 96% on room air. IV 0.9% NS with Angio Cath:  20g  IV Site: R Antecubital  IV Started by:  Stanton Kidney, EMT-P  Chest Size (in):  44 Cup Size: n/a  Height: 5\' 8"  (1.727 m)  Weight:  202 lb (91.627 kg)  BMI:  Body mass index is 30.71 kg/(m^2). Tech Comments:  NA    Nuclear Med Study 1 or 2 day study: 1 day  Stress Test Type:  Lexiscan  Reading MD: Charlton Haws, MD  Order Authorizing Provider:  Tonny Bollman, MD  Resting Radionuclide: Technetium 58m Sestamibi  Resting Radionuclide Dose: 11.0 mCi   Stress Radionuclide:  Technetium 50m Sestamibi  Stress Radionuclide Dose: 33.0 mCi           Stress Protocol Rest HR: 65 Stress HR: 78  Rest BP: 118/73 Stress BP: 125/71  Exercise Time (min): n/a METS: n/a   Predicted Max HR: 133 bpm % Max HR: 58.65 bpm Rate Pressure Product: 9750   Dose of Adenosine (mg):  n/a Dose of Lexiscan: 0.4 mg  Dose of Atropine (mg): n/a Dose of Dobutamine: n/a mcg/kg/min (at max HR)  Stress Test Technologist: Bonnita Levan, RN  Nuclear Technologist:  Domenic Polite, CNMT     Rest Procedure:  Myocardial perfusion imaging was performed at rest 45 minutes following the intravenous administration of Technetium 56m Sestamibi. Rest ECG: Sinus Rhythm  Stress Procedure:  The patient received IV Lexiscan 0.4 mg over 15-seconds.  Technetium 55m Sestamibi injected at 30-seconds.  There were no  significant changes with Lexiscan.  Quantitative spect images were obtained after a 45 minute delay. Stress ECG: No significant change from baseline ECG  QPS Raw Data Images:  Normal; no motion artifact; normal heart/lung ratio. Stress Images:  Normal homogeneous uptake in all areas of the myocardium. Rest Images:  Normal homogeneous uptake in all areas of the myocardium. Subtraction (SDS):  Normal Transient Ischemic Dilatation (Normal <1.22):  1.21 Lung/Heart Ratio (Normal <0.45):  0.31  Quantitative Gated Spect Images QGS EDV:  81 ml QGS ESV:  30 ml  Impression Exercise Capacity:  Lexiscan with no exercise. BP Response:  Normal blood pressure response. Clinical Symptoms:  No significant symptoms noted. ECG Impression:  No significant ST segment change suggestive of ischemia. Comparison with Prior Nuclear Study: No images to compare  Overall Impression:  Normal stress nuclear study.  LV Ejection Fraction: 62%.  LV Wall Motion:  NL LV Function; NL Wall Motion    Charlton Haws

## 2012-09-20 ENCOUNTER — Encounter: Payer: Self-pay | Admitting: Physician Assistant

## 2012-09-20 ENCOUNTER — Telehealth: Payer: Self-pay | Admitting: *Deleted

## 2012-09-20 NOTE — Telephone Encounter (Signed)
Message copied by Tarri Fuller on Fri Sep 20, 2012  3:35 PM ------      Message from: [place], [place] T      Created: Fri Sep 20, 2012  1:53 PM       Please inform patient stress test normal.      Tereso Newcomer, PA-C  1:53 PM 09/20/2012

## 2012-09-20 NOTE — Telephone Encounter (Signed)
pt notified about stress test results today

## 2012-10-21 ENCOUNTER — Telehealth: Payer: Self-pay | Admitting: Internal Medicine

## 2012-10-21 NOTE — Telephone Encounter (Signed)
Caller: [name]/Child; Patient Name: [name]; PCP: [name] (Adults only); Best Callback Phone Number: [phone] Son calling stating that [name]'s last B-12 shot was on 06/26/12.  States the office was out and was told to check with the pharmacy.  Pharmacy told him that it is on backorder.  Son concerned because he has not had one in over 3 months.  Wants to know if office now has it in stock and when he can get it.  OFFICE, PLEASE F/U WITH PT REGARDING B12 SHOT.

## 2012-10-21 NOTE — Telephone Encounter (Signed)
Called the patients son informed we do have B12 and transferred to scheduler to schedule nurse visit.

## 2012-10-22 ENCOUNTER — Ambulatory Visit (INDEPENDENT_AMBULATORY_CARE_PROVIDER_SITE_OTHER): Payer: Medicare Other

## 2012-10-22 ENCOUNTER — Telehealth: Payer: Self-pay | Admitting: Internal Medicine

## 2012-10-22 DIAGNOSIS — E538 Deficiency of other specified B group vitamins: Secondary | ICD-10-CM

## 2012-10-22 MED ORDER — CYANOCOBALAMIN 1000 MCG/ML IJ SOLN
1000.0000 ug | Freq: Once | INTRAMUSCULAR | Status: AC
  Administered 2012-10-22: 1000 ug via INTRAMUSCULAR

## 2012-10-22 NOTE — Telephone Encounter (Signed)
Patient changed from Dr Tenny Craw and he had been getting a B12 every 2 weeks, he has not had one in 4 months but he came in for one today, when should he get another injection?

## 2012-10-22 NOTE — Telephone Encounter (Signed)
Ok to start monthly b12 shots here in the office, unless he is able to accomplish this on his own or a family member  To robin to handle

## 2012-10-23 NOTE — Telephone Encounter (Signed)
Informed of MD instructions. 

## 2012-11-05 ENCOUNTER — Ambulatory Visit (INDEPENDENT_AMBULATORY_CARE_PROVIDER_SITE_OTHER): Payer: Medicare Other | Admitting: *Deleted

## 2012-11-05 DIAGNOSIS — E538 Deficiency of other specified B group vitamins: Secondary | ICD-10-CM

## 2012-11-05 MED ORDER — CYANOCOBALAMIN 1000 MCG/ML IJ SOLN
1000.0000 ug | Freq: Once | INTRAMUSCULAR | Status: AC
  Administered 2012-11-05: 1000 ug via INTRAMUSCULAR

## 2012-11-18 ENCOUNTER — Emergency Department (HOSPITAL_COMMUNITY): Payer: Medicare Other

## 2012-11-18 ENCOUNTER — Emergency Department (HOSPITAL_COMMUNITY)
Admission: EM | Admit: 2012-11-18 | Discharge: 2012-11-18 | Disposition: A | Discharge status: Home / Self Care | Payer: Medicare Other | Source: Home / Self Care | Attending: Emergency Medicine | Admitting: Emergency Medicine

## 2012-11-18 ENCOUNTER — Encounter (HOSPITAL_COMMUNITY): Payer: Self-pay | Admitting: *Deleted

## 2012-11-18 DIAGNOSIS — I2510 Atherosclerotic heart disease of native coronary artery without angina pectoris: Secondary | ICD-10-CM | POA: Insufficient documentation

## 2012-11-18 DIAGNOSIS — Z87891 Personal history of nicotine dependence: Secondary | ICD-10-CM | POA: Insufficient documentation

## 2012-11-18 DIAGNOSIS — S270XXA Traumatic pneumothorax, initial encounter: Secondary | ICD-10-CM

## 2012-11-18 DIAGNOSIS — Z8679 Personal history of other diseases of the circulatory system: Secondary | ICD-10-CM | POA: Insufficient documentation

## 2012-11-18 DIAGNOSIS — J90 Pleural effusion, not elsewhere classified: Secondary | ICD-10-CM | POA: Insufficient documentation

## 2012-11-18 DIAGNOSIS — Z8546 Personal history of malignant neoplasm of prostate: Secondary | ICD-10-CM | POA: Insufficient documentation

## 2012-11-18 DIAGNOSIS — N184 Chronic kidney disease, stage 4 (severe): Secondary | ICD-10-CM | POA: Insufficient documentation

## 2012-11-18 DIAGNOSIS — Z9181 History of falling: Secondary | ICD-10-CM | POA: Insufficient documentation

## 2012-11-18 DIAGNOSIS — J948 Other specified pleural conditions: Secondary | ICD-10-CM

## 2012-11-18 DIAGNOSIS — X58XXXA Exposure to other specified factors, initial encounter: Secondary | ICD-10-CM | POA: Insufficient documentation

## 2012-11-18 DIAGNOSIS — R4182 Altered mental status, unspecified: Secondary | ICD-10-CM | POA: Insufficient documentation

## 2012-11-18 DIAGNOSIS — E039 Hypothyroidism, unspecified: Secondary | ICD-10-CM | POA: Insufficient documentation

## 2012-11-18 DIAGNOSIS — E538 Deficiency of other specified B group vitamins: Secondary | ICD-10-CM | POA: Insufficient documentation

## 2012-11-18 DIAGNOSIS — Z8673 Personal history of transient ischemic attack (TIA), and cerebral infarction without residual deficits: Secondary | ICD-10-CM | POA: Insufficient documentation

## 2012-11-18 DIAGNOSIS — E291 Testicular hypofunction: Secondary | ICD-10-CM | POA: Insufficient documentation

## 2012-11-18 DIAGNOSIS — IMO0001 Reserved for inherently not codable concepts without codable children: Secondary | ICD-10-CM | POA: Insufficient documentation

## 2012-11-18 DIAGNOSIS — Z8619 Personal history of other infectious and parasitic diseases: Secondary | ICD-10-CM | POA: Insufficient documentation

## 2012-11-18 DIAGNOSIS — Z79899 Other long term (current) drug therapy: Secondary | ICD-10-CM | POA: Insufficient documentation

## 2012-11-18 DIAGNOSIS — S2239XA Fracture of one rib, unspecified side, initial encounter for closed fracture: Secondary | ICD-10-CM | POA: Insufficient documentation

## 2012-11-18 DIAGNOSIS — Z862 Personal history of diseases of the blood and blood-forming organs and certain disorders involving the immune mechanism: Secondary | ICD-10-CM | POA: Insufficient documentation

## 2012-11-18 DIAGNOSIS — Z8659 Personal history of other mental and behavioral disorders: Secondary | ICD-10-CM | POA: Insufficient documentation

## 2012-11-18 DIAGNOSIS — J4489 Other specified chronic obstructive pulmonary disease: Secondary | ICD-10-CM | POA: Insufficient documentation

## 2012-11-18 DIAGNOSIS — J449 Chronic obstructive pulmonary disease, unspecified: Secondary | ICD-10-CM | POA: Insufficient documentation

## 2012-11-18 DIAGNOSIS — M1990 Unspecified osteoarthritis, unspecified site: Secondary | ICD-10-CM | POA: Insufficient documentation

## 2012-11-18 DIAGNOSIS — R441 Visual hallucinations: Secondary | ICD-10-CM

## 2012-11-18 DIAGNOSIS — H5316 Psychophysical visual disturbances: Secondary | ICD-10-CM | POA: Insufficient documentation

## 2012-11-18 DIAGNOSIS — G4730 Sleep apnea, unspecified: Secondary | ICD-10-CM | POA: Insufficient documentation

## 2012-11-18 DIAGNOSIS — E785 Hyperlipidemia, unspecified: Secondary | ICD-10-CM | POA: Insufficient documentation

## 2012-11-18 DIAGNOSIS — Y929 Unspecified place or not applicable: Secondary | ICD-10-CM | POA: Insufficient documentation

## 2012-11-18 DIAGNOSIS — Z87442 Personal history of urinary calculi: Secondary | ICD-10-CM | POA: Insufficient documentation

## 2012-11-18 DIAGNOSIS — E213 Hyperparathyroidism, unspecified: Secondary | ICD-10-CM | POA: Insufficient documentation

## 2012-11-18 DIAGNOSIS — R42 Dizziness and giddiness: Secondary | ICD-10-CM | POA: Insufficient documentation

## 2012-11-18 DIAGNOSIS — E669 Obesity, unspecified: Secondary | ICD-10-CM | POA: Insufficient documentation

## 2012-11-18 DIAGNOSIS — Y939 Activity, unspecified: Secondary | ICD-10-CM | POA: Insufficient documentation

## 2012-11-18 LAB — LACTIC ACID, PLASMA: Lactic Acid, Venous: 1.3 mmol/L (ref 0.5–2.2)

## 2012-11-18 LAB — URINALYSIS, ROUTINE W REFLEX MICROSCOPIC
Hgb urine dipstick: NEGATIVE
Ketones, ur: NEGATIVE mg/dL
Protein, ur: NEGATIVE mg/dL
Urobilinogen, UA: 1 mg/dL (ref 0.0–1.0)

## 2012-11-18 LAB — CBC WITH DIFFERENTIAL/PLATELET
Basophils Absolute: 0 10*3/uL (ref 0.0–0.1)
Basophils Relative: 0 % (ref 0–1)
Eosinophils Absolute: 0.5 10*3/uL (ref 0.0–0.7)
Eosinophils Relative: 5 % (ref 0–5)
Lymphocytes Relative: 16 % (ref 12–46)
MCHC: 33.3 g/dL (ref 30.0–36.0)
MCV: 101.4 fL — ABNORMAL HIGH (ref 78.0–100.0)
Monocytes Absolute: 0.7 10*3/uL (ref 0.1–1.0)
Platelets: 298 10*3/uL (ref 150–400)
RDW: 14 % (ref 11.5–15.5)
WBC: 10.1 10*3/uL (ref 4.0–10.5)

## 2012-11-18 LAB — COMPREHENSIVE METABOLIC PANEL
ALT: 14 U/L (ref 0–53)
AST: 21 U/L (ref 0–37)
Albumin: 3 g/dL — ABNORMAL LOW (ref 3.5–5.2)
CO2: 28 mEq/L (ref 19–32)
Calcium: 8.9 mg/dL (ref 8.4–10.5)
Creatinine, Ser: 1.89 mg/dL — ABNORMAL HIGH (ref 0.50–1.35)
Sodium: 138 mEq/L (ref 135–145)
Total Protein: 5.8 g/dL — ABNORMAL LOW (ref 6.0–8.3)

## 2012-11-18 LAB — TROPONIN I: Troponin I: <0.3 ng/mL (ref <0.30)

## 2012-11-18 LAB — ACETAMINOPHEN LEVEL: Acetaminophen (Tylenol), Serum: <15 ug/mL (ref 10–30)

## 2012-11-18 MED ORDER — NALOXONE HCL 0.4 MG/ML IJ SOLN
0.4000 mg | Freq: Once | INTRAMUSCULAR | Status: DC
  Filled 2012-11-18: qty 1

## 2012-11-18 NOTE — Consult Note (Signed)
[address]            [place] [address]          [phone]      Reason for Consult: Right hydropneumothorax Referring Physician:  Dr. Ranelle Oyster is an 78 y.o. male.  HPI:  The patient is an 78 year old gentleman who lives at home with his wife and reportedly suffered a fall striking the right posterolateral chest last Wednesday. He was seen at [facility] and diagnosed with a right rib fracture. He was treated with Vicodin and is now taking tramadol. His family brought him to the emergency room today because of hallucinations and confusion. They also feel that his abdomen is more distended than usual. He has had CT scan which showed no acute abnormality. A chest x-ray showed a questionable right apical pneumothorax and therefore a CT scan of the chest was performed which shows a small right hydropneumothorax with atelectasis at the right base. I was asked to evaluate this in the emergency room.  Past Medical History  Diagnosis Date  . CAD (coronary artery disease)     s/p NSTEMI and BMS stent diagonal07/09;  Lexiscan Myoview 9/13:  EF 62%, no ischemia  . AS (aortic stenosis)     a.  Echo 2009 mean gradient 9mm HG. AVA 2.18;  b.  Echo 4/11 mild AS mean gradient 10mm HG;  c.  Echo 04/2011: Mild LVH, EF 55-60%, grade 1 diastolic dysfunction, mild aortic stenosis, mean gradient 14, mild LAE;  d. Echo 9/13: mod LVH, EF 50-55%, mild to mod AS, mean gradient 17 mmHg  . AF (paroxysmal atrial fibrillation)     Holter monitor 2.5 sec pauses  . Fatigue     CPX 11/09 VO2  14.4 (75% predicte)d, slope 35.  O2 pulse normal.  VO2 corrected for body weight 17.6.  Marland Kitchen Hypothyroidism   . Hyperlipidemia   . History of prostate cancer   . Allergic rhinitis   . DM (diabetes mellitus)     type II diet controlled  . Shingles   . Left carotid stenosis   . Vitamin B 12 deficiency   . Anemia   . Osteoarthritis   . Nephrolithiasis   . Obesity   . OSA  (obstructive sleep apnea)     AHI 15 PSG 09/06/08  . Hx of colonoscopy   . Stroke   . Anxiety and depression   . PMR (polymyalgia rheumatica) 05/29/2012  . Hypogonadism male 05/29/2012  . Hyperparathyroidism 05/29/2012  . TIA (transient ischemic attack) 05/29/2012  . CKD (chronic kidney disease) stage 4, GFR 15-29 ml/min 05/29/2012  . COPD (chronic obstructive pulmonary disease) 05/29/2012    Past Surgical History  Procedure Date  . Appendectomy   . Hernia repair   . Right carpal tunnel release   . Lumbar spine surgery   . Cystectomy     neck  . Prostatectomy   . Vasectomy   . Total knee arthroplasty     right  . Cholecystectomy     Family History  Problem Relation Age of Onset  . Stroke Mother 64  . Coronary artery disease      father, brother, sister  . Alcohol abuse Father 70    Social History:  reports that he quit smoking about 53 years ago. His smoking use included Cigarettes. He has a 12.5 pack-year smoking history. He has  quit using smokeless tobacco. He reports that he does not drink alcohol or use illicit drugs.  Allergies:  Allergies  Allergen Reactions  . Horse-Derived Products Hives    Medications:  I have reviewed the patient's current medications. Prior to Admission:  (Not in a hospital admission) Scheduled:   . [DISCONTINUED] naLOXone (NARCAN)  injection  0.4 mg Intravenous Once   Continuous:  PRN:  Results for orders placed during the hospital encounter of 11/18/12 (from the past 48 hour(s))  CBC WITH DIFFERENTIAL     Status: Abnormal   Collection Time   11/18/12  1:12 PM      Component Value Range Comment   WBC 10.1  4.0 - 10.5 K/uL    RBC 4.17 (*) 4.22 - 5.81 MIL/uL    Hemoglobin 14.1  13.0 - 17.0 g/dL    HCT 84.6  96.2 - 95.2 %    MCV 101.4 (*) 78.0 - 100.0 fL    MCH 33.8  26.0 - 34.0 pg    MCHC 33.3  30.0 - 36.0 g/dL    RDW 84.1  32.4 - 40.1 %    Platelets 298  150 - 400 K/uL    Neutrophils Relative 72  43 - 77 %    Neutro Abs 7.3  1.7 -  7.7 K/uL    Lymphocytes Relative 16  12 - 46 %    Lymphs Abs 1.6  0.7 - 4.0 K/uL    Monocytes Relative 7  3 - 12 %    Monocytes Absolute 0.7  0.1 - 1.0 K/uL    Eosinophils Relative 5  0 - 5 %    Eosinophils Absolute 0.5  0.0 - 0.7 K/uL    Basophils Relative 0  0 - 1 %    Basophils Absolute 0.0  0.0 - 0.1 K/uL   COMPREHENSIVE METABOLIC PANEL     Status: Abnormal   Collection Time   11/18/12  1:12 PM      Component Value Range Comment   Sodium 138  135 - 145 mEq/L    Potassium 5.2 (*) 3.5 - 5.1 mEq/L    Chloride 103  96 - 112 mEq/L    CO2 28  19 - 32 mEq/L    Glucose, Bld 110 (*) 70 - 99 mg/dL    BUN 26 (*) 6 - 23 mg/dL    Creatinine, Ser 0.27 (*) 0.50 - 1.35 mg/dL    Calcium 8.9  8.4 - 25.3 mg/dL    Total Protein 5.8 (*) 6.0 - 8.3 g/dL    Albumin 3.0 (*) 3.5 - 5.2 g/dL    AST 21  0 - 37 U/L    ALT 14  0 - 53 U/L    Alkaline Phosphatase 59  39 - 117 U/L    Total Bilirubin 0.5  0.3 - 1.2 mg/dL    GFR calc non Af Amer 30 (*) >90 mL/min    GFR calc Af Amer 35 (*) >90 mL/min   ETHANOL     Status: Normal   Collection Time   11/18/12  1:12 PM      Component Value Range Comment   Alcohol, Ethyl (B) <11  0 - 11 mg/dL   TROPONIN I     Status: Normal   Collection Time   11/18/12  2:26 PM      Component Value Range Comment   Troponin I <0.30  <0.30 ng/mL   ACETAMINOPHEN LEVEL     Status: Normal   Collection Time  11/18/12  2:26 PM      Component Value Range Comment   Acetaminophen (Tylenol), Serum <15.0  10 - 30 ug/mL   LACTIC ACID, PLASMA     Status: Normal   Collection Time   11/18/12  2:27 PM      Component Value Range Comment   Lactic Acid, Venous 1.3  0.5 - 2.2 mmol/L   URINALYSIS, ROUTINE W REFLEX MICROSCOPIC     Status: Normal   Collection Time   11/18/12  2:51 PM      Component Value Range Comment   Color, Urine YELLOW  YELLOW    APPearance CLEAR  CLEAR    Specific Gravity, Urine 1.028  1.005 - 1.030    pH 6.0  5.0 - 8.0    Glucose, UA NEGATIVE  NEGATIVE mg/dL     Hgb urine dipstick NEGATIVE  NEGATIVE    Bilirubin Urine NEGATIVE  NEGATIVE    Ketones, ur NEGATIVE  NEGATIVE mg/dL    Protein, ur NEGATIVE  NEGATIVE mg/dL    Urobilinogen, UA 1.0  0.0 - 1.0 mg/dL    Nitrite NEGATIVE  NEGATIVE    Leukocytes, UA NEGATIVE  NEGATIVE MICROSCOPIC NOT DONE ON URINES WITH NEGATIVE PROTEIN, BLOOD, LEUKOCYTES, NITRITE, OR GLUCOSE <1000 mg/dL.    Ct Head Wo Contrast  11/18/2012  *RADIOLOGY REPORT*  Clinical Data: Altered mental status, slurred speech.  CT HEAD WITHOUT CONTRAST  Technique:  Contiguous axial images were obtained from the base of the skull through the vertex without contrast.  Comparison: December 28, 2010.  Findings: Moderate diffuse cortical atrophy is noted.  No mass effect or midline shift is noted.  Ventricular size is within normal limits.  There is no evidence of mass lesion, hemorrhage or acute infarction.  IMPRESSION: Moderate diffuse cortical atrophy.  No acute intracranial abnormality seen.   Original Report Authenticated By: Lupita Raider.,  M.D.    Ct Chest Wo Contrast  11/18/2012  *RADIOLOGY REPORT*  Clinical Data: Possible right pneumothorax or hydrothorax seen on portable chest radiograph  CT CHEST WITHOUT CONTRAST  Technique:  Multidetector CT imaging of the chest was performed following the standard protocol without IV contrast.  Comparison: Chest radiograph dated 11/18/2012  Findings: Small to moderate right hydropneumothorax.  Additional gas adjacent to the right heart border (series 5/images 41 and 44), favored to reflect medial pneumothorax rather than pneumomediastinum.  Compressive atelectasis in the right lower lobe.  Scarring versus atelectasis in the right middle lobe. Left lung is essentially clear.  Visualized thyroid is unremarkable.  The heart is normal in size.  No pericardial effusion.  Coronary atherosclerosis.  Atherosclerotic calcifications of the aortic arch.  Small mediastinal lymph nodes which do not meet pathologic CT size  criteria.  No suspicious axillary lymphadenopathy.  Visualized upper abdomen is notable for vascular calcifications and cholecystectomy clips.  Degenerative changes of the visualized thoracolumbar spine. Right posterolateral 9th rib fracture (series 2/image 43).  IMPRESSION: Right posterolateral 9th rib fracture.  Associated small to moderate right hydropneumothorax.  Right middle and lower lobe scarring/atelectasis.  These results were called by telephone on 11/18/2012 at 1430 hours to Dr. Gerhard Munch, who verbally acknowledged these results.   Original Report Authenticated By: Charline Bills, M.D.    Dg Chest Port 1 View  11/18/2012  *RADIOLOGY REPORT*  Clinical Data: Altered mental status and weakness. History of recent fall.  PORTABLE CHEST - 1 VIEW  Comparison: None.  Findings: Single view of the chest was obtained.  There is  concern for a small right apical pneumothorax and unusual pleural-based densities at the right lung base.  Findings could be associated with a hydrothorax.  The left basilar densities may represent pleural fluid and atelectasis.  Heart and mediastinum are stable. Trachea is near midline.  There are prominent right infrahilar and right basilar densities which could be associated volume loss and cannot exclude consolidation in this area.  There is an old right anterior third rib fracture.  Difficult to exclude new fractures on this examination.  IMPRESSION: Possible right pneumothorax or hydrothorax.  Densities in the medial right lung base.  The findings may be better evaluated with a chest CT.  Left basilar densities could represent a combination of pleural fluid and atelectasis.  These results were called by telephone on 11/18/2012 at 1:48 p.m. to Dr. Jeraldine Loots, who verbally acknowledged these results.   Original Report Authenticated By: Richarda Overlie, M.D.     Review of Systems  Constitutional: Negative for fever and chills.  Respiratory: Negative for cough, hemoptysis, sputum  production, shortness of breath and wheezing.   Cardiovascular: Negative for chest pain and orthopnea.  Gastrointestinal: Negative for nausea and vomiting.   Blood pressure 158/78, pulse 74, temperature 97.7 F (36.5 C), temperature source Oral, resp. rate 14, SpO2 92.00%. Physical Exam  Constitutional:       Elderly gentleman in no distress.  HENT:  Head: Normocephalic and atraumatic.  Eyes: EOM are normal. Pupils are equal, round, and reactive to light.  Neck: No tracheal deviation present.  Cardiovascular: Normal rate, regular rhythm and normal heart sounds.   No murmur heard. Respiratory: Effort normal. No stridor. No respiratory distress. He has no wheezes. He has no rales. He exhibits no tenderness.       Slight decrease in breath sounds on the right compared to the left.  GI: Soft. Bowel sounds are normal. He exhibits distension. There is no tenderness.    Assessment/Plan:  The patient has a small right hydropneumothorax that probably occurred last Wednesday when he had his fall, fracturing the right posterior lateral rib. He denies any chest pain or shortness of breath and the symptoms that brought him to the hospital were confusion and hallucinations which may be related to the pain medication he was taking. I don't think a chest tube is indicated at this time and most likely the hydropneumothorax will resolve without intervention. I will be happy to see the patient in my office in one week and we'll do a followup chest x-ray to be sure this is resolving without incident. I discussed this with the patient and his family and they are in agreement with that plan.  Alleen Borne 11/18/2012, 4:47 PM

## 2012-11-18 NOTE — ED Provider Notes (Addendum)
Care assumed at sign out from Dr. Jeraldine Loots. [name] is a 78 y.o. male hx of CAD, s/p fall a week ago with 9th rib fracture here with visual hallucinations and small to moderate hydropneumothorax. CT surgery, Dr. Laneta Simmers, evaluated the patient and felt that the hydropneumothorax is small and can be followed up outpatient. Patient was hypoxic initially but now is 92-93% on RA and not tachypneic and not complaining of SOB. Patient says that he feels well. I reviewed his meds. He was recently started on vicodin after the fall and he continues to take tramadol. Both these agents can cause confusion and visual hallucinations. His CT head and UA are negative and no suspicion for infection. Recommend d/c these two agents if possible and f/u in a week for repeat CXR. Stable for d/c. Return precautions given.    Richardean Canal, MD 11/18/12 1644  Richardean Canal, MD 11/18/12 [phone]

## 2012-11-18 NOTE — ED Provider Notes (Signed)
History     CSN: [id number]  Arrival date & time 11/18/12  1202   First MD Initiated Contact with Patient 11/18/12 1242      Chief Complaint  Patient presents with  . Headache  . Dizziness  . Altered Mental Status    (Consider location/radiation/quality/duration/timing/severity/associated sxs/prior treatment) HPI The patient presents with his family members who provide the history of present illness.  He states that recently the patient has been progressively more disoriented, delusional.  Though there was a small amount of these conditions, for some time, the patient's admission changed substantially over the past days.  They state that the patient is now having visual hallucinations, has diminished recall, is significantly less interactive. Notably, the patient had a fall last week.  The fall seems to have been mechanical, and following a fall the patient was seen at another facility where he was diagnosed with a rib fracture.  The patient's family states that since that fall the patient has been taking a significant analgesics, possibly in excessive amounts.  Past Medical History  Diagnosis Date  . CAD (coronary artery disease)     s/p NSTEMI and BMS stent diagonal07/09;  Lexiscan Myoview 9/13:  EF 62%, no ischemia  . AS (aortic stenosis)     a.  Echo 2009 mean gradient 9mm HG. AVA 2.18;  b.  Echo 4/11 mild AS mean gradient 10mm HG;  c.  Echo 04/2011: Mild LVH, EF 55-60%, grade 1 diastolic dysfunction, mild aortic stenosis, mean gradient 14, mild LAE;  d. Echo 9/13: mod LVH, EF 50-55%, mild to mod AS, mean gradient 17 mmHg  . AF (paroxysmal atrial fibrillation)     Holter monitor 2.5 sec pauses  . Fatigue     CPX 11/09 VO2  14.4 (75% predicte)d, slope 35.  O2 pulse normal.  VO2 corrected for body weight 17.6.  Marland Kitchen Hypothyroidism   . Hyperlipidemia   . History of prostate cancer   . Allergic rhinitis   . DM (diabetes mellitus)     type II diet controlled  . Shingles   . Left  carotid stenosis   . Vitamin B 12 deficiency   . Anemia   . Osteoarthritis   . Nephrolithiasis   . Obesity   . OSA (obstructive sleep apnea)     AHI 15 PSG 09/06/08  . Hx of colonoscopy   . Stroke   . Anxiety and depression   . PMR (polymyalgia rheumatica) 05/29/2012  . Hypogonadism male 05/29/2012  . Hyperparathyroidism 05/29/2012  . TIA (transient ischemic attack) 05/29/2012  . CKD (chronic kidney disease) stage 4, GFR 15-29 ml/min 05/29/2012  . COPD (chronic obstructive pulmonary disease) 05/29/2012    Past Surgical History  Procedure Date  . Appendectomy   . Hernia repair   . Right carpal tunnel release   . Lumbar spine surgery   . Cystectomy     neck  . Prostatectomy   . Vasectomy   . Total knee arthroplasty     right  . Cholecystectomy     Family History  Problem Relation Age of Onset  . Stroke Mother 41  . Coronary artery disease      father, brother, sister  . Alcohol abuse Father 77    History  Substance Use Topics  . Smoking status: Former Smoker -- 0.5 packs/day for 25 years    Types: Cigarettes    Quit date: 12/25/1958  . Smokeless tobacco: Former Neurosurgeon     Comment: quit in 1950s  .  Alcohol Use: No      Review of Systems  Unable to perform ROS: Mental status change    Allergies  Horse-derived products  Home Medications   Current Outpatient Rx  Name  Route  Sig  Dispense  Refill  . VITAMIN C 500 MG PO TABS   Oral   Take 500 mg by mouth daily.           Marland Kitchen VITAMIN D 1000 UNITS PO TABS   Oral   Take 1,000 Units by mouth daily.         Marland Kitchen CLOPIDOGREL BISULFATE 75 MG PO TABS   Oral   Take 1 tablet (75 mg total) by mouth daily.   90 tablet   3   . VITAMIN B-12 IJ   Intramuscular   Inject 1,000 mcg into the muscle every 14 (fourteen) days. Every 2 weeks         . CYCLOBENZAPRINE HCL 10 MG PO TABS   Oral   Take 5-10 mg by mouth 3 (three) times daily as needed. Muscle spasms.         . FOLIC ACID 1 MG PO TABS   Oral   Take 1 mg by  mouth daily.         . FUROSEMIDE 20 MG PO TABS   Oral   Take 20 mg by mouth daily.          Marland Kitchen GABAPENTIN 300 MG PO CAPS   Oral   Take 1 capsule (300 mg total) by mouth at bedtime.   30 capsule   5   . HYDRALAZINE HCL 25 MG PO TABS      1/2 tab by mouth twice per day   90 tablet   3   . HYDROCODONE-ACETAMINOPHEN 5-500 MG PO TABS   Oral   Take 1 tablet by mouth every 6 (six) hours as needed. Pain         . HYDROXYZINE HCL 10 MG PO TABS   Oral   Take 10 mg by mouth 3 (three) times daily as needed. nerves         . HYOSCYAMINE SULFATE 0.125 MG PO TABS   Oral   Take 0.125 mg by mouth 4 (four) times daily as needed. Gas pain.         Marland Kitchen LEVOTHYROXINE SODIUM 137 MCG PO TABS   Oral   Take 1 tablet (137 mcg total) by mouth daily.   90 tablet   3   . METHOTREXATE 2.5 MG PO TABS   Oral   Take 10 mg by mouth once a week. Takes 4 tablets weekly on  Sunday.   Caution:Chemotherapy. Protect from light.         Marland Kitchen PRAVASTATIN SODIUM 40 MG PO TABS   Oral   Take 1 tablet (40 mg total) by mouth daily.   30 tablet   5   . SOLIFENACIN SUCCINATE 5 MG PO TABS   Oral   Take 5 mg by mouth daily.           . SULFAMETHOXAZOLE-TMP DS 800-160 MG PO TABS   Oral   Take 1 tablet by mouth 2 (two) times daily. 10 day course started on 11/11/12.         Marland Kitchen TRAMADOL HCL 50 MG PO TABS   Oral   Take 50-100 mg by mouth 3 (three) times daily as needed. Pain.         . TRAZODONE HCL 100 MG PO  TABS   Oral   Take 100 mg by mouth at bedtime.           BP 154/80  Pulse 87  Temp 98.6 F (37 C) (Oral)  Resp 18  SpO2 95%  Physical Exam  Nursing note and vitals reviewed. Constitutional: He appears well-developed and well-nourished. He appears listless.  HENT:  Head: Normocephalic and atraumatic.  Mouth/Throat: No oropharyngeal exudate.  Eyes: Conjunctivae normal are normal. Pupils are equal, round, and reactive to light. Right eye exhibits no discharge. Left eye exhibits  no discharge.       Patient does not track consistently  Neck: Normal range of motion. Neck supple.  Cardiovascular: Normal rate and regular rhythm.   Pulmonary/Chest: No stridor. Not tachypneic. He has decreased breath sounds.    Abdominal: He exhibits distension. There is no hepatosplenomegaly. There is no tenderness. There is no rigidity, no rebound, no guarding and no CVA tenderness.  Neurological: He appears listless.       Patient does not cooperate fully any neurologic exam, though he moves all extremities spontaneously, awakens to right brief answers to questions, and interacts with his children briefly  Skin: Skin is warm and dry. He is not diaphoretic.  Psychiatric: He is slowed. Thought content is delusional. Cognition and memory are impaired. He exhibits abnormal recent memory and abnormal remote memory.    ED Course  Procedures (including critical care time)   Labs Reviewed  URINALYSIS, ROUTINE W REFLEX MICROSCOPIC  CBC WITH DIFFERENTIAL  COMPREHENSIVE METABOLIC PANEL  ETHANOL  LACTIC ACID, PLASMA  TROPONIN I  TROPONIN I  TROPONIN I  ACETAMINOPHEN LEVEL   Ct Head Wo Contrast  11/18/2012  *RADIOLOGY REPORT*  Clinical Data: Altered mental status, slurred speech.  CT HEAD WITHOUT CONTRAST  Technique:  Contiguous axial images were obtained from the base of the skull through the vertex without contrast.  Comparison: December 28, 2010.  Findings: Moderate diffuse cortical atrophy is noted.  No mass effect or midline shift is noted.  Ventricular size is within normal limits.  There is no evidence of mass lesion, hemorrhage or acute infarction.  IMPRESSION: Moderate diffuse cortical atrophy.  No acute intracranial abnormality seen.   Original Report Authenticated By: Lupita Raider.,  M.D.    Ct Chest Wo Contrast  11/18/2012  *RADIOLOGY REPORT*  Clinical Data: Possible right pneumothorax or hydrothorax seen on portable chest radiograph  CT CHEST WITHOUT CONTRAST  Technique:   Multidetector CT imaging of the chest was performed following the standard protocol without IV contrast.  Comparison: Chest radiograph dated 11/18/2012  Findings: Small to moderate right hydropneumothorax.  Additional gas adjacent to the right heart border (series 5/images 41 and 44), favored to reflect medial pneumothorax rather than pneumomediastinum.  Compressive atelectasis in the right lower lobe.  Scarring versus atelectasis in the right middle lobe. Left lung is essentially clear.  Visualized thyroid is unremarkable.  The heart is normal in size.  No pericardial effusion.  Coronary atherosclerosis.  Atherosclerotic calcifications of the aortic arch.  Small mediastinal lymph nodes which do not meet pathologic CT size criteria.  No suspicious axillary lymphadenopathy.  Visualized upper abdomen is notable for vascular calcifications and cholecystectomy clips.  Degenerative changes of the visualized thoracolumbar spine. Right posterolateral 9th rib fracture (series 2/image 43).  IMPRESSION: Right posterolateral 9th rib fracture.  Associated small to moderate right hydropneumothorax.  Right middle and lower lobe scarring/atelectasis.  These results were called by telephone on 11/18/2012 at 1430 hours to Dr.  Gerhard Munch, who verbally acknowledged these results.   Original Report Authenticated By: Charline Bills, M.D.    Dg Chest Port 1 View  11/18/2012  *RADIOLOGY REPORT*  Clinical Data: Altered mental status and weakness. History of recent fall.  PORTABLE CHEST - 1 VIEW  Comparison: None.  Findings: Single view of the chest was obtained.  There is concern for a small right apical pneumothorax and unusual pleural-based densities at the right lung base.  Findings could be associated with a hydrothorax.  The left basilar densities may represent pleural fluid and atelectasis.  Heart and mediastinum are stable. Trachea is near midline.  There are prominent right infrahilar and right basilar densities which  could be associated volume loss and cannot exclude consolidation in this area.  There is an old right anterior third rib fracture.  Difficult to exclude new fractures on this examination.  IMPRESSION: Possible right pneumothorax or hydrothorax.  Densities in the medial right lung base.  The findings may be better evaluated with a chest CT.  Left basilar densities could represent a combination of pleural fluid and atelectasis.  These results were called by telephone on 11/18/2012 at 1:48 p.m. to Dr. Jeraldine Loots, who verbally acknowledged these results.   Original Report Authenticated By: Richarda Overlie, M.D.    After an initial exam, the patient's kids relate that the patient may have been taking excessive amounts of his pain medications.  No diagnosis found.  Pulse ox on initial eval is 88% room air abnormal  Cardiac 85 sinus rhythm normal    Date: 11/18/2012  Rate: 78  Rhythm: normal sinus rhythm  QRS Axis: normal  Intervals: PR prolonged  ST/T Wave abnormalities: nonspecific ST changes  Conduction Disutrbances:first-degree A-V block   Narrative Interpretation:   Old EKG Reviewed: none available ABNORMAL  Given the patient's history of recent fall, his hypoxia, there was initial suspicion of pneumonia.  A portable chest x-ray demonstrated findings concerning for pneumothorax.  A repeat chest CT demonstrated hydropneumothorax, with rib fracture.  I discussed this with our cardiothoracic team, who will evaluate the patient.  On repeat exam the patient remains stable.  MDM  This elderly male presents with altered mental status.  On exam the patient is minimally interactive.  The patient is hypoxic.  Given the recent trauma there suspicion for intrathoracic lesion.  A chest CT demonstrates a hydropneumothorax, with rib fracture.  Additionally, the patient's family notes that the patient has been taking significant amounts of analgesics, possibly contributing to his hypoxia / AMS.  Given the CT  findings, I discussed his case with our cardiothoracic team, who will admit the patient.         Gerhard Munch, MD 11/18/12 [id number]

## 2012-11-18 NOTE — ED Notes (Signed)
Per Pt's family pt had slurred speech last night and this morning pt was confused and "seeing things that were not there". Pt sts to this writer "there is a cat under sink, go get it." Pt's son is concerned that pt may be taking too much pain medication which was prescribed to pt. for cracked ribs.

## 2012-11-18 NOTE — ED Notes (Signed)
Bed:WA07<BR> Expected date:<BR> Expected time:<BR> Means of arrival:<BR> Comments:<BR> ems

## 2012-11-18 NOTE — ED Notes (Signed)
Patient transported to CT 

## 2012-11-18 NOTE — ED Notes (Signed)
This writer unable to start peripheral IV after 2 attempts. IV team paged.

## 2012-11-18 NOTE — ED Notes (Signed)
Per EMS;pt comes from home where pt and family reported pt getting increasingly confused over last few weeks, talks about strange things. Pt stated to EMS that he feels something is wrong with him. Pt also c/o mild headache and dizziness.

## 2012-11-18 NOTE — ED Notes (Signed)
MD at bedside. 

## 2012-11-19 ENCOUNTER — Encounter (HOSPITAL_COMMUNITY): Payer: Self-pay

## 2012-11-19 ENCOUNTER — Observation Stay (HOSPITAL_COMMUNITY): Payer: Medicare Other

## 2012-11-19 ENCOUNTER — Emergency Department (HOSPITAL_COMMUNITY): Payer: Medicare Other

## 2012-11-19 ENCOUNTER — Inpatient Hospital Stay (HOSPITAL_COMMUNITY)
Admission: EM | Admit: 2012-11-19 | Discharge: 2012-11-22 | DRG: 137 | Disposition: A | Discharge status: Home Health | Payer: Medicare Other | Attending: Internal Medicine | Admitting: Internal Medicine

## 2012-11-19 ENCOUNTER — Ambulatory Visit: Payer: Medicare Other

## 2012-11-19 DIAGNOSIS — J449 Chronic obstructive pulmonary disease, unspecified: Secondary | ICD-10-CM

## 2012-11-19 DIAGNOSIS — E669 Obesity, unspecified: Secondary | ICD-10-CM

## 2012-11-19 DIAGNOSIS — I252 Old myocardial infarction: Secondary | ICD-10-CM

## 2012-11-19 DIAGNOSIS — N179 Acute kidney failure, unspecified: Secondary | ICD-10-CM | POA: Diagnosis present

## 2012-11-19 DIAGNOSIS — I2510 Atherosclerotic heart disease of native coronary artery without angina pectoris: Secondary | ICD-10-CM | POA: Diagnosis present

## 2012-11-19 DIAGNOSIS — J90 Pleural effusion, not elsewhere classified: Secondary | ICD-10-CM

## 2012-11-19 DIAGNOSIS — E039 Hypothyroidism, unspecified: Secondary | ICD-10-CM

## 2012-11-19 DIAGNOSIS — E538 Deficiency of other specified B group vitamins: Secondary | ICD-10-CM

## 2012-11-19 DIAGNOSIS — I359 Nonrheumatic aortic valve disorder, unspecified: Secondary | ICD-10-CM

## 2012-11-19 DIAGNOSIS — G4733 Obstructive sleep apnea (adult) (pediatric): Secondary | ICD-10-CM

## 2012-11-19 DIAGNOSIS — J939 Pneumothorax, unspecified: Secondary | ICD-10-CM | POA: Diagnosis present

## 2012-11-19 DIAGNOSIS — E875 Hyperkalemia: Secondary | ICD-10-CM

## 2012-11-19 DIAGNOSIS — L259 Unspecified contact dermatitis, unspecified cause: Secondary | ICD-10-CM

## 2012-11-19 DIAGNOSIS — E785 Hyperlipidemia, unspecified: Secondary | ICD-10-CM

## 2012-11-19 DIAGNOSIS — F419 Anxiety disorder, unspecified: Secondary | ICD-10-CM

## 2012-11-19 DIAGNOSIS — L0390 Cellulitis, unspecified: Secondary | ICD-10-CM | POA: Diagnosis present

## 2012-11-19 DIAGNOSIS — R252 Cramp and spasm: Secondary | ICD-10-CM

## 2012-11-19 DIAGNOSIS — M722 Plantar fascial fibromatosis: Secondary | ICD-10-CM

## 2012-11-19 DIAGNOSIS — I4891 Unspecified atrial fibrillation: Secondary | ICD-10-CM | POA: Diagnosis present

## 2012-11-19 DIAGNOSIS — M353 Polymyalgia rheumatica: Secondary | ICD-10-CM

## 2012-11-19 DIAGNOSIS — Z9889 Other specified postprocedural states: Secondary | ICD-10-CM

## 2012-11-19 DIAGNOSIS — M549 Dorsalgia, unspecified: Secondary | ICD-10-CM

## 2012-11-19 DIAGNOSIS — E291 Testicular hypofunction: Secondary | ICD-10-CM

## 2012-11-19 DIAGNOSIS — D179 Benign lipomatous neoplasm, unspecified: Secondary | ICD-10-CM

## 2012-11-19 DIAGNOSIS — R609 Edema, unspecified: Secondary | ICD-10-CM

## 2012-11-19 DIAGNOSIS — R042 Hemoptysis: Secondary | ICD-10-CM | POA: Diagnosis present

## 2012-11-19 DIAGNOSIS — E213 Hyperparathyroidism, unspecified: Secondary | ICD-10-CM

## 2012-11-19 DIAGNOSIS — D510 Vitamin B12 deficiency anemia due to intrinsic factor deficiency: Secondary | ICD-10-CM

## 2012-11-19 DIAGNOSIS — L02619 Cutaneous abscess of unspecified foot: Secondary | ICD-10-CM | POA: Diagnosis present

## 2012-11-19 DIAGNOSIS — Z8546 Personal history of malignant neoplasm of prostate: Secondary | ICD-10-CM

## 2012-11-19 DIAGNOSIS — T50995A Adverse effect of other drugs, medicaments and biological substances, initial encounter: Secondary | ICD-10-CM

## 2012-11-19 DIAGNOSIS — I350 Nonrheumatic aortic (valve) stenosis: Secondary | ICD-10-CM

## 2012-11-19 DIAGNOSIS — N200 Calculus of kidney: Secondary | ICD-10-CM

## 2012-11-19 DIAGNOSIS — E119 Type 2 diabetes mellitus without complications: Secondary | ICD-10-CM | POA: Diagnosis present

## 2012-11-19 DIAGNOSIS — L559 Sunburn, unspecified: Secondary | ICD-10-CM

## 2012-11-19 DIAGNOSIS — Z8673 Personal history of transient ischemic attack (TIA), and cerebral infarction without residual deficits: Secondary | ICD-10-CM

## 2012-11-19 DIAGNOSIS — B029 Zoster without complications: Secondary | ICD-10-CM

## 2012-11-19 DIAGNOSIS — M1990 Unspecified osteoarthritis, unspecified site: Secondary | ICD-10-CM

## 2012-11-19 DIAGNOSIS — R269 Unspecified abnormalities of gait and mobility: Secondary | ICD-10-CM

## 2012-11-19 DIAGNOSIS — F329 Major depressive disorder, single episode, unspecified: Secondary | ICD-10-CM

## 2012-11-19 DIAGNOSIS — F3289 Other specified depressive episodes: Secondary | ICD-10-CM

## 2012-11-19 DIAGNOSIS — J9383 Other pneumothorax: Secondary | ICD-10-CM | POA: Diagnosis present

## 2012-11-19 DIAGNOSIS — F19921 Other psychoactive substance use, unspecified with intoxication with delirium: Secondary | ICD-10-CM | POA: Diagnosis present

## 2012-11-19 DIAGNOSIS — R509 Fever, unspecified: Secondary | ICD-10-CM

## 2012-11-19 DIAGNOSIS — E876 Hypokalemia: Secondary | ICD-10-CM

## 2012-11-19 DIAGNOSIS — N19 Unspecified kidney failure: Secondary | ICD-10-CM

## 2012-11-19 DIAGNOSIS — Z0000 Encounter for general adult medical examination without abnormal findings: Secondary | ICD-10-CM

## 2012-11-19 DIAGNOSIS — J309 Allergic rhinitis, unspecified: Secondary | ICD-10-CM

## 2012-11-19 DIAGNOSIS — K625 Hemorrhage of anus and rectum: Secondary | ICD-10-CM

## 2012-11-19 DIAGNOSIS — I10 Essential (primary) hypertension: Secondary | ICD-10-CM

## 2012-11-19 DIAGNOSIS — S2239XA Fracture of one rib, unspecified side, initial encounter for closed fracture: Secondary | ICD-10-CM

## 2012-11-19 DIAGNOSIS — I480 Paroxysmal atrial fibrillation: Secondary | ICD-10-CM | POA: Diagnosis present

## 2012-11-19 DIAGNOSIS — R5381 Other malaise: Secondary | ICD-10-CM

## 2012-11-19 DIAGNOSIS — D649 Anemia, unspecified: Secondary | ICD-10-CM

## 2012-11-19 DIAGNOSIS — J948 Other specified pleural conditions: Secondary | ICD-10-CM

## 2012-11-19 DIAGNOSIS — I6522 Occlusion and stenosis of left carotid artery: Secondary | ICD-10-CM

## 2012-11-19 DIAGNOSIS — N184 Chronic kidney disease, stage 4 (severe): Secondary | ICD-10-CM

## 2012-11-19 DIAGNOSIS — K120 Recurrent oral aphthae: Principal | ICD-10-CM | POA: Diagnosis present

## 2012-11-19 DIAGNOSIS — M7150 Other bursitis, not elsewhere classified, unspecified site: Secondary | ICD-10-CM

## 2012-11-19 DIAGNOSIS — R410 Disorientation, unspecified: Secondary | ICD-10-CM

## 2012-11-19 DIAGNOSIS — IMO0002 Reserved for concepts with insufficient information to code with codable children: Secondary | ICD-10-CM | POA: Diagnosis present

## 2012-11-19 DIAGNOSIS — W010XXA Fall on same level from slipping, tripping and stumbling without subsequent striking against object, initial encounter: Secondary | ICD-10-CM | POA: Diagnosis present

## 2012-11-19 DIAGNOSIS — G459 Transient cerebral ischemic attack, unspecified: Secondary | ICD-10-CM

## 2012-11-19 DIAGNOSIS — Z9861 Coronary angioplasty status: Secondary | ICD-10-CM

## 2012-11-19 DIAGNOSIS — F32A Depression, unspecified: Secondary | ICD-10-CM

## 2012-11-19 DIAGNOSIS — I4892 Unspecified atrial flutter: Secondary | ICD-10-CM

## 2012-11-19 DIAGNOSIS — L03119 Cellulitis of unspecified part of limb: Secondary | ICD-10-CM | POA: Diagnosis present

## 2012-11-19 LAB — CBC WITH DIFFERENTIAL/PLATELET
Basophils Absolute: 0 10*3/uL (ref 0.0–0.1)
Eosinophils Relative: 2 % (ref 0–5)
Lymphocytes Relative: 14 % (ref 12–46)
Lymphs Abs: 1.5 10*3/uL (ref 0.7–4.0)
MCV: 100 fL (ref 78.0–100.0)
Neutrophils Relative %: 80 % — ABNORMAL HIGH (ref 43–77)
Platelets: 299 10*3/uL (ref 150–400)
RBC: 4.34 MIL/uL (ref 4.22–5.81)
RDW: 13.8 % (ref 11.5–15.5)
WBC: 11 10*3/uL — ABNORMAL HIGH (ref 4.0–10.5)

## 2012-11-19 LAB — URINALYSIS, ROUTINE W REFLEX MICROSCOPIC
Glucose, UA: NEGATIVE mg/dL
Protein, ur: NEGATIVE mg/dL
Specific Gravity, Urine: 1.022 (ref 1.005–1.030)
Urobilinogen, UA: 1 mg/dL (ref 0.0–1.0)

## 2012-11-19 LAB — PROTIME-INR: INR: 1.02 (ref 0.00–1.49)

## 2012-11-19 LAB — BASIC METABOLIC PANEL
CO2: 23 mEq/L (ref 19–32)
Calcium: 8.9 mg/dL (ref 8.4–10.5)
GFR calc non Af Amer: 29 mL/min — ABNORMAL LOW (ref >90)
Glucose, Bld: 109 mg/dL — ABNORMAL HIGH (ref 70–99)
Potassium: 4.5 mEq/L (ref 3.5–5.1)
Sodium: 135 mEq/L (ref 135–145)

## 2012-11-19 LAB — URINE MICROSCOPIC-ADD ON

## 2012-11-19 LAB — OCCULT BLOOD, POC DEVICE: Fecal Occult Bld: NEGATIVE

## 2012-11-19 MED ORDER — HYDROXYZINE HCL 10 MG PO TABS
10.0000 mg | ORAL_TABLET | Freq: Three times a day (TID) | ORAL | Status: DC | PRN
  Filled 2012-11-19: qty 1

## 2012-11-19 MED ORDER — OXYMETAZOLINE HCL 0.05 % NA SOLN
1.0000 | Freq: Once | NASAL | Status: AC
  Administered 2012-11-19: 1 via NASAL
  Filled 2012-11-19: qty 15

## 2012-11-19 MED ORDER — TRAZODONE HCL 100 MG PO TABS
100.0000 mg | ORAL_TABLET | Freq: Every day | ORAL | Status: DC
  Administered 2012-11-20 – 2012-11-21 (×2): 100 mg via ORAL
  Filled 2012-11-19 (×5): qty 1

## 2012-11-19 MED ORDER — GABAPENTIN 300 MG PO CAPS
300.0000 mg | ORAL_CAPSULE | Freq: Every day | ORAL | Status: DC
  Administered 2012-11-19 – 2012-11-21 (×3): 300 mg via ORAL
  Filled 2012-11-19 (×4): qty 1

## 2012-11-19 MED ORDER — LEVOTHYROXINE SODIUM 137 MCG PO TABS
137.0000 ug | ORAL_TABLET | Freq: Every day | ORAL | Status: DC
Start: 2012-11-19 — End: 2012-11-22
  Administered 2012-11-19 – 2012-11-22 (×4): 137 ug via ORAL
  Filled 2012-11-19 (×5): qty 1

## 2012-11-19 MED ORDER — VITAMIN C 500 MG PO TABS
500.0000 mg | ORAL_TABLET | Freq: Every day | ORAL | Status: DC
  Administered 2012-11-19 – 2012-11-22 (×4): 500 mg via ORAL
  Filled 2012-11-19 (×4): qty 1

## 2012-11-19 MED ORDER — MORPHINE SULFATE 2 MG/ML IJ SOLN: 2.0000 mg | INTRAMUSCULAR | Status: DC | PRN

## 2012-11-19 MED ORDER — SODIUM CHLORIDE 0.9 % IJ SOLN: 3.0000 mL | Freq: Two times a day (BID) | INTRAMUSCULAR | Status: DC

## 2012-11-19 MED ORDER — HALOPERIDOL LACTATE 5 MG/ML IJ SOLN
5.0000 mg | Freq: Once | INTRAMUSCULAR | Status: AC
  Administered 2012-11-19: 5 mg via INTRAVENOUS
  Filled 2012-11-19: qty 1

## 2012-11-19 MED ORDER — ONDANSETRON HCL 4 MG PO TABS: 4.0000 mg | ORAL_TABLET | Freq: Four times a day (QID) | ORAL | Status: DC | PRN

## 2012-11-19 MED ORDER — DARIFENACIN HYDROBROMIDE ER 7.5 MG PO TB24
7.5000 mg | ORAL_TABLET | Freq: Every day | ORAL | Status: DC
  Administered 2012-11-19 – 2012-11-22 (×4): 7.5 mg via ORAL
  Filled 2012-11-19 (×4): qty 1

## 2012-11-19 MED ORDER — LORAZEPAM 2 MG/ML IJ SOLN
1.0000 mg | Freq: Once | INTRAMUSCULAR | Status: AC
  Administered 2012-11-19: 1 mg via INTRAVENOUS
  Filled 2012-11-19: qty 1

## 2012-11-19 MED ORDER — HYDRALAZINE HCL 25 MG PO TABS
12.5000 mg | ORAL_TABLET | Freq: Two times a day (BID) | ORAL | Status: DC
  Administered 2012-11-19 – 2012-11-22 (×6): 12.5 mg via ORAL
  Filled 2012-11-19 (×7): qty 0.5

## 2012-11-19 MED ORDER — VITAMIN D3 25 MCG (1000 UNIT) PO TABS
1000.0000 [IU] | ORAL_TABLET | Freq: Every day | ORAL | Status: DC
  Administered 2012-11-19 – 2012-11-22 (×4): 1000 [IU] via ORAL
  Filled 2012-11-19 (×5): qty 1

## 2012-11-19 MED ORDER — SODIUM CHLORIDE 0.9 % IJ SOLN
3.0000 mL | Freq: Two times a day (BID) | INTRAMUSCULAR | Status: DC
  Administered 2012-11-20: 3 mL via INTRAVENOUS

## 2012-11-19 MED ORDER — POLYETHYLENE GLYCOL 3350 17 G PO PACK
17.0000 g | PACK | Freq: Every day | ORAL | Status: DC
  Administered 2012-11-20 – 2012-11-22 (×3): 17 g via ORAL
  Filled 2012-11-19 (×4): qty 1

## 2012-11-19 MED ORDER — MAGIC MOUTHWASH W/LIDOCAINE
5.0000 mL | Freq: Four times a day (QID) | ORAL | Status: DC | PRN
  Filled 2012-11-19: qty 5

## 2012-11-19 MED ORDER — SIMVASTATIN 20 MG PO TABS
20.0000 mg | ORAL_TABLET | Freq: Every day | ORAL | Status: DC
  Administered 2012-11-19 – 2012-11-21 (×3): 20 mg via ORAL
  Filled 2012-11-19 (×5): qty 1

## 2012-11-19 MED ORDER — SODIUM CHLORIDE 0.9 % IJ SOLN: 3.0000 mL | INTRAMUSCULAR | Status: DC | PRN

## 2012-11-19 MED ORDER — ONDANSETRON HCL 4 MG/2ML IJ SOLN: 4.0000 mg | Freq: Four times a day (QID) | INTRAMUSCULAR | Status: DC | PRN

## 2012-11-19 MED ORDER — FOLIC ACID 1 MG PO TABS
1.0000 mg | ORAL_TABLET | Freq: Every day | ORAL | Status: DC
  Administered 2012-11-19 – 2012-11-22 (×4): 1 mg via ORAL
  Filled 2012-11-19 (×4): qty 1

## 2012-11-19 MED ORDER — HYOSCYAMINE SULFATE 0.125 MG PO TABS
0.1250 mg | ORAL_TABLET | Freq: Four times a day (QID) | ORAL | Status: DC | PRN
  Filled 2012-11-19: qty 1

## 2012-11-19 MED ORDER — SODIUM CHLORIDE 0.9 % IV SOLN: 250.0000 mL | INTRAVENOUS | Status: DC | PRN

## 2012-11-19 MED ORDER — BISACODYL 10 MG RE SUPP: 10.0000 mg | Freq: Once | RECTAL | Status: DC

## 2012-11-19 MED ORDER — LORAZEPAM 2 MG/ML IJ SOLN
0.5000 mg | Freq: Once | INTRAMUSCULAR | Status: AC
  Administered 2012-11-19: 0.5 mg via INTRAVENOUS
  Filled 2012-11-19: qty 1

## 2012-11-19 MED ORDER — LIDOCAINE VISCOUS 2 % MT SOLN
20.0000 mL | Freq: Four times a day (QID) | OROMUCOSAL | Status: DC | PRN
  Filled 2012-11-19: qty 20

## 2012-11-19 MED ORDER — CLOPIDOGREL BISULFATE 75 MG PO TABS
75.0000 mg | ORAL_TABLET | Freq: Every day | ORAL | Status: DC
  Administered 2012-11-19 – 2012-11-22 (×4): 75 mg via ORAL
  Filled 2012-11-19 (×5): qty 1

## 2012-11-19 MED ORDER — ACETAMINOPHEN 650 MG RE SUPP: 650.0000 mg | Freq: Four times a day (QID) | RECTAL | Status: DC | PRN

## 2012-11-19 MED ORDER — ACETAMINOPHEN 325 MG PO TABS
650.0000 mg | ORAL_TABLET | Freq: Four times a day (QID) | ORAL | Status: DC | PRN
  Administered 2012-11-20 – 2012-11-21 (×2): 650 mg via ORAL
  Filled 2012-11-19 (×2): qty 2

## 2012-11-19 MED ORDER — SODIUM CHLORIDE 0.9 % IV SOLN
INTRAVENOUS | Status: AC
  Administered 2012-11-19: 75 mL/h via INTRAVENOUS
  Administered 2012-11-20: 06:00:00 via INTRAVENOUS

## 2012-11-19 MED ORDER — DOCUSATE SODIUM 100 MG PO CAPS
100.0000 mg | ORAL_CAPSULE | Freq: Two times a day (BID) | ORAL | Status: DC
  Administered 2012-11-19 – 2012-11-22 (×6): 100 mg via ORAL
  Filled 2012-11-19 (×9): qty 1

## 2012-11-19 NOTE — ED Notes (Signed)
Per EMS, Pt began coughing up x 2 hrs.  Pt sts "alot of dark red blood."  EMS sts Pt has not coughed since they arrived on seen.  Pt also complaining of hallucinations x 6months.  Pt was seen at this facility for hallucination complaint yesterday.  Pt told that medication could be causing the hallucinations.

## 2012-11-19 NOTE — ED Notes (Signed)
MD at bedside. 

## 2012-11-19 NOTE — ED Provider Notes (Addendum)
History     CSN: [id number]  Arrival date & time 11/19/12  4098   First MD Initiated Contact with Patient 11/19/12 0815      Chief Complaint  Patient presents with  . Hemoptysis  . Hallucinations    (Consider location/radiation/quality/duration/timing/severity/associated sxs/prior treatment) HPIWilliam A [name] is a 78 y.o. male presenting for the second time in 2 days. Patient had fallen and hurt his side was evaluated yesterday and found to have a hydropneumothorax on the right, that is been stable, determined to be nonoperative. Patient's also had some delirium while on narcotic pain medicine. Family brought the patient to the ER today because the patient had an episode of hemoptysis this morning. Patient says it was a couple of tablespoons but is uncertain as to the exact volume. This is not occurred again since this morning. No other alleviating or exacerbating factors and no other associated symptoms. Patient has COPD, but stopped using oxygen at home a few years ago. Patient saturating in the low 90s at rest.   Past Medical History  Diagnosis Date  . CAD (coronary artery disease)     s/p NSTEMI and BMS stent diagonal07/09;  Lexiscan Myoview 9/13:  EF 62%, no ischemia  . AS (aortic stenosis)     a.  Echo 2009 mean gradient 9mm HG. AVA 2.18;  b.  Echo 4/11 mild AS mean gradient 10mm HG;  c.  Echo 04/2011: Mild LVH, EF 55-60%, grade 1 diastolic dysfunction, mild aortic stenosis, mean gradient 14, mild LAE;  d. Echo 9/13: mod LVH, EF 50-55%, mild to mod AS, mean gradient 17 mmHg  . AF (paroxysmal atrial fibrillation)     Holter monitor 2.5 sec pauses  . Fatigue     CPX 11/09 VO2  14.4 (75% predicte)d, slope 35.  O2 pulse normal.  VO2 corrected for body weight 17.6.  Marland Kitchen Hypothyroidism   . Hyperlipidemia   . History of prostate cancer   . Allergic rhinitis   . DM (diabetes mellitus)     type II diet controlled  . Shingles   . Left carotid stenosis   . Vitamin B 12 deficiency   .  Anemia   . Osteoarthritis   . Nephrolithiasis   . Obesity   . OSA (obstructive sleep apnea)     AHI 15 PSG 09/06/08  . Hx of colonoscopy   . Stroke   . Anxiety and depression   . PMR (polymyalgia rheumatica) 05/29/2012  . Hypogonadism male 05/29/2012  . Hyperparathyroidism 05/29/2012  . TIA (transient ischemic attack) 05/29/2012  . CKD (chronic kidney disease) stage 4, GFR 15-29 ml/min 05/29/2012  . COPD (chronic obstructive pulmonary disease) 05/29/2012    Past Surgical History  Procedure Date  . Appendectomy   . Hernia repair   . Right carpal tunnel release   . Lumbar spine surgery   . Cystectomy     neck  . Prostatectomy   . Vasectomy   . Total knee arthroplasty     right  . Cholecystectomy     Family History  Problem Relation Age of Onset  . Stroke Mother 56  . Coronary artery disease      father, brother, sister  . Alcohol abuse Father 13    History  Substance Use Topics  . Smoking status: Former Smoker -- 0.5 packs/day for 25 years    Types: Cigarettes    Quit date: 12/25/1958  . Smokeless tobacco: Former Neurosurgeon     Comment: quit in 1950s  .  Alcohol Use: No      Review of Systems At least 10pt or greater review of systems completed and are negative except where specified in the HPI.  Allergies  Horse-derived products  Home Medications   Current Outpatient Rx  Name  Route  Sig  Dispense  Refill  . VITAMIN C 500 MG PO TABS   Oral   Take 500 mg by mouth daily.           Marland Kitchen VITAMIN D 1000 UNITS PO TABS   Oral   Take 1,000 Units by mouth daily.         Marland Kitchen CLOPIDOGREL BISULFATE 75 MG PO TABS   Oral   Take 1 tablet (75 mg total) by mouth daily.   90 tablet   3   . VITAMIN B-12 IJ   Intramuscular   Inject 1,000 mcg into the muscle every 14 (fourteen) days. Every 2 weeks         . CYCLOBENZAPRINE HCL 10 MG PO TABS   Oral   Take 5-10 mg by mouth 3 (three) times daily as needed. Muscle spasms.         . FOLIC ACID 1 MG PO TABS   Oral   Take 1  mg by mouth daily.         . FUROSEMIDE 20 MG PO TABS   Oral   Take 20 mg by mouth daily.          Marland Kitchen GABAPENTIN 300 MG PO CAPS   Oral   Take 1 capsule (300 mg total) by mouth at bedtime.   30 capsule   5   . HYDRALAZINE HCL 25 MG PO TABS      1/2 tab by mouth twice per day   90 tablet   3   . HYDROCODONE-ACETAMINOPHEN 5-500 MG PO TABS   Oral   Take 1 tablet by mouth every 6 (six) hours as needed. Pain         . HYDROXYZINE HCL 10 MG PO TABS   Oral   Take 10 mg by mouth 3 (three) times daily as needed. nerves         . HYOSCYAMINE SULFATE 0.125 MG PO TABS   Oral   Take 0.125 mg by mouth 4 (four) times daily as needed. Gas pain.         Marland Kitchen LEVOTHYROXINE SODIUM 137 MCG PO TABS   Oral   Take 1 tablet (137 mcg total) by mouth daily.   90 tablet   3   . METHOTREXATE 2.5 MG PO TABS   Oral   Take 10 mg by mouth once a week. Takes 4 tablets weekly on  Sunday.   Caution:Chemotherapy. Protect from light.         Marland Kitchen PRAVASTATIN SODIUM 40 MG PO TABS   Oral   Take 1 tablet (40 mg total) by mouth daily.   30 tablet   5   . SOLIFENACIN SUCCINATE 5 MG PO TABS   Oral   Take 5 mg by mouth daily.           . SULFAMETHOXAZOLE-TMP DS 800-160 MG PO TABS   Oral   Take 1 tablet by mouth 2 (two) times daily. 10 day course started on 11/11/12.         Marland Kitchen TRAMADOL HCL 50 MG PO TABS   Oral   Take 50-100 mg by mouth 3 (three) times daily as needed. Pain.         Marland Kitchen  TRAZODONE HCL 100 MG PO TABS   Oral   Take 100 mg by mouth at bedtime.           BP 146/95  Pulse 91  Temp 98.8 F (37.1 C) (Oral)  Resp 16  SpO2 92%  Physical Exam  Nursing notes reviewed.  Electronic medical record reviewed. VITAL SIGNS:   Filed Vitals:   11/20/12 1026 11/20/12 1316 11/20/12 2032 11/21/12 0615  BP: 161/87 170/86 135/72 136/71  Pulse: 86 83 86 75  Temp: 97.5 F (36.4 C) 97.4 F (36.3 C) 97.9 F (36.6 C) 97.6 F (36.4 C)  TempSrc: Oral Oral Oral Oral  Resp: 20 18 18 18    Height:      Weight:      SpO2: 98% 98% 96% 97%   CONSTITUTIONAL: Awake, oriented, appears non-toxic HENT: Atraumatic, normocephalic, oral mucosa pink and moist, airway patent. Nares patent without drainage. External ears normal. EYES: Conjunctiva clear, EOMI, PERRLA NECK: Trachea midline, non-tender, supple CARDIOVASCULAR: Normal heart rate, Normal rhythm, No murmurs, rubs, gallops PULMONARY/CHEST: Clear to auscultation, decreased at right base. Non-tender.  Area of bruising right side of thoracic back from fall 1 week ago. ABDOMINAL: Non-distended, soft, non-tender - no rebound or guarding.  BS normal. NEUROLOGIC: Non-focal, moving all four extremities, no gross sensory or motor deficits. EXTREMITIES: No clubbing, cyanosis, or edema SKIN: Warm, Dry, No erythema, No rash  ED Course  [place] bedside Performed by: Jones Skene Authorized by: Jones Skene Consent: Verbal consent obtained. Consent given by: patient  [place] bedside Performed by: Jones Skene Authorized by: Jones Skene Consent: Verbal consent obtained. Risks and benefits: risks, benefits and alternatives were discussed Comments: Ultrasound of the right upper quadrant showed no fluid in Morison's pouch, showed minimal pleural effusion on Right.    (including critical care time)  Labs Reviewed  CBC WITH DIFFERENTIAL - Abnormal; Notable for the following:    WBC 11.0 (*)     Neutrophils Relative 80 (*)     Neutro Abs 8.8 (*)     All other components within normal limits  BASIC METABOLIC PANEL - Abnormal; Notable for the following:    Glucose, Bld 109 (*)     BUN 28 (*)     Creatinine, Ser 1.94 (*)     GFR calc non Af Amer 29 (*)     GFR calc Af Amer 34 (*)     All other components within normal limits  URINALYSIS, ROUTINE W REFLEX MICROSCOPIC - Abnormal; Notable for the following:    Ketones, ur TRACE (*)     Leukocytes, UA TRACE (*)     All other components within normal limits  URINE MICROSCOPIC-ADD ON  - Abnormal; Notable for the following:    Squamous Epithelial / LPF FEW (*)     Casts HYALINE CASTS (*)     Crystals CA OXALATE CRYSTALS (*)     All other components within normal limits  OCCULT BLOOD, POC DEVICE   Dg Chest 2 View  11/19/2012  *RADIOLOGY REPORT*  Clinical Data: Hemoptysis.  Pneumothorax.  CHEST - 2 VIEW  Comparison: 11/18/2012.  Findings: There is a small persistent right-sided pneumothorax estimated at 10 a 15%.  Persistent right basilar atelectasis and/or contusion.  The left lung remains relatively clear.  IMPRESSION: Persistent small right-sided pneumothorax and right basilar atelectasis.   Original Report Authenticated By: Rudie Meyer, M.D.    Ct Head Wo Contrast  11/18/2012  *RADIOLOGY REPORT*  Clinical Data: Altered mental status, slurred speech.  CT HEAD WITHOUT CONTRAST  Technique:  Contiguous axial images were obtained from the base of the skull through the vertex without contrast.  Comparison: December 28, 2010.  Findings: Moderate diffuse cortical atrophy is noted.  No mass effect or midline shift is noted.  Ventricular size is within normal limits.  There is no evidence of mass lesion, hemorrhage or acute infarction.  IMPRESSION: Moderate diffuse cortical atrophy.  No acute intracranial abnormality seen.   Original Report Authenticated By: Lupita Raider.,  M.D.    Ct Chest Wo Contrast  11/18/2012  *RADIOLOGY REPORT*  Clinical Data: Possible right pneumothorax or hydrothorax seen on portable chest radiograph  CT CHEST WITHOUT CONTRAST  Technique:  Multidetector CT imaging of the chest was performed following the standard protocol without IV contrast.  Comparison: Chest radiograph dated 11/18/2012  Findings: Small to moderate right hydropneumothorax.  Additional gas adjacent to the right heart border (series 5/images 41 and 44), favored to reflect medial pneumothorax rather than pneumomediastinum.  Compressive atelectasis in the right lower lobe.  Scarring versus  atelectasis in the right middle lobe. Left lung is essentially clear.  Visualized thyroid is unremarkable.  The heart is normal in size.  No pericardial effusion.  Coronary atherosclerosis.  Atherosclerotic calcifications of the aortic arch.  Small mediastinal lymph nodes which do not meet pathologic CT size criteria.  No suspicious axillary lymphadenopathy.  Visualized upper abdomen is notable for vascular calcifications and cholecystectomy clips.  Degenerative changes of the visualized thoracolumbar spine. Right posterolateral 9th rib fracture (series 2/image 43).  IMPRESSION: Right posterolateral 9th rib fracture.  Associated small to moderate right hydropneumothorax.  Right middle and lower lobe scarring/atelectasis.  These results were called by telephone on 11/18/2012 at 1430 hours to Dr. Gerhard Munch, who verbally acknowledged these results.   Original Report Authenticated By: Charline Bills, M.D.    Dg Chest Port 1 View  11/18/2012  *RADIOLOGY REPORT*  Clinical Data: Altered mental status and weakness. History of recent fall.  PORTABLE CHEST - 1 VIEW  Comparison: None.  Findings: Single view of the chest was obtained.  There is concern for a small right apical pneumothorax and unusual pleural-based densities at the right lung base.  Findings could be associated with a hydrothorax.  The left basilar densities may represent pleural fluid and atelectasis.  Heart and mediastinum are stable. Trachea is near midline.  There are prominent right infrahilar and right basilar densities which could be associated volume loss and cannot exclude consolidation in this area.  There is an old right anterior third rib fracture.  Difficult to exclude new fractures on this examination.  IMPRESSION: Possible right pneumothorax or hydrothorax.  Densities in the medial right lung base.  The findings may be better evaluated with a chest CT.  Left basilar densities could represent a combination of pleural fluid and  atelectasis.  These results were called by telephone on 11/18/2012 at 1:48 p.m. to Dr. Jeraldine Loots, who verbally acknowledged these results.   Original Report Authenticated By: Richarda Overlie, M.D.      1. Rib fracture   2. Hydrothorax   3. COPD (chronic obstructive pulmonary disease)   4. Hemoptysis   5. Delirium   6. CAD (coronary artery disease)   7. CKD (chronic kidney disease) stage 4, GFR 15-29 ml/min   8. Acute kidney failure   9. Cellulitis   10. Diabetes mellitus   11. Aphthous ulcer   12. Pneumothorax, right   13. PAF (paroxysmal atrial fibrillation)   14.  History of CVA (cerebrovascular accident)   15. Ulcer   16. LIPOMA   17. HYPOTHYROIDISM   18. VITAMIN B12 DEFICIENCY   19. HYPERLIPIDEMIA   20. HYPERKALEMIA   21. HYPOKALEMIA   22. Pernicious anemia   23. ANEMIA   24. DEPRESSIVE DISORDER NOT ELSEWHERE CLASSIFIED   25. SLEEP APNEA, OBSTRUCTIVE   26. HYPERTENSION, BENIGN   27. CORONARY ARTERY DISEASE   28. AORTIC STENOSIS/ INSUFFICIENCY, NON-RHEUMATIC   29. Atrial flutter   30. ALLERGIC RHINITIS   31. RECTAL BLEEDING   32. RENAL FAILURE   33. CONTACT DERMATITIS&OTHER ECZEMA DUE TO SUNBURN   34. DERMATITIS, FACE   35. BACK PAIN   36. BURSITIS   37. PLANTAR FASCIITIS   38. LEG CRAMPS, NOCTURNAL   39. FATIGUE   40. Edema   41. UNS ADVRS EFF OTH RX MEDICINAL&BIOLOGICAL SBSTNC   42. PROSTATE CANCER, HX OF   43. Preventative health care   44. AS (aortic stenosis)   45. AF (paroxysmal atrial fibrillation)   46. History of prostate cancer   47. DM (diabetes mellitus)   48. Shingles   49. Left carotid stenosis   50. Osteoarthritis   51. Nephrolithiasis   52. Obesity   53. Hx of colonoscopy   54. Depression   55. PMR (polymyalgia rheumatica)   56. History of MI (myocardial infarction)   73. Hypogonadism male   37. Anxiety   59. Hyperparathyroidism   60. TIA (transient ischemic attack)   61. OSA (obstructive sleep apnea)   62. Gait disorder   63. Chronic  steroid use   64. Fever       MDM  ESA RADEN is a 78 y.o. male with one episode of hemoptysis, he is status post fall and small right-sided hydropneumothorax who was evaluated yesterday CT surgery and found not to require a chest tube or any further intervention. Patient presents today with continued delirium and episode of hemoptysis.  Labs show chronic renal insufficicency, no white count, vital signs are stable he is breathing normally, he is saturating about what he would at home however there is concern with this hemoptysis episode. Discussed with Dr. Laneta Simmers no further intervention is needed today after looking at his CXR.  Pt on plavix, second time in ED, pt is COPD who may benefit from O2 - will admit for observation.   11/19/2012 12:26 PM Discussed with Dr. Brien Few for admission.      Jones Skene, MD 11/21/12 0830  Jones Skene, MD 12/09/12 1601

## 2012-11-19 NOTE — H&P (Signed)
PCP:   [name], MD   Chief Complaint:  Hemoptysis  HPI: This is an 78 year old male, with known history of CAD, s/p NSTEMI, s/p PCI/Stent 06/2008, s/p CVA, previous TIA, , aortic stenosis, PAF, COPD, OSA confirmed by PSG 08/2008, PMR, hypothyroidism, dyslipidemia, DM-2, CKD-4, hyperparathyroidism, urolithiasis, allergic rhinitis, prostate cancer s/p prostatectomy, Anemia, B12 deficiency, obesity, OA, s/p lumbar spinal surgery, s/p right TKA. Patient reportedly fell on 11/18/12, suffered a fall striking the right posterolateral. He was seen at [facility] and diagnosed with a right rib fracture, treated with opioid analgesics. His family brought him to the ED on 11/18/12, with  hallucinations and confusion. Chest CT scan on that date, revealed a right posterolateral 9th rib fracture a small right hydropneumothorax with atelectasis at the right base. Patient was evaluated by Dr Evelene Croon, TCTS, who felt that a chest tube is indicated at this time and most likely the hydropneumothorax will resolve without intervention. Patient was then discharged with recommendations to follow up in one week with repeat CXR. According to patient and family, confusion is much, improved but he had a restless night, an this morning, coughed up blood. He did have some retrosternal burning discomfort at the same time, but this was relieved by kayopectate, and has not recurred. Patient denies fever, chills,or shortness of breath Family recollect that he complained of some soreness in the throat on 11/16/12, and they are concerned that his abdomen seems bloated. Patient denies abdominal pain, and last bowel movement was in AM of 11/19/12. He denies vomiting, and is chronically constipated. Patient apparently fell about 2 months ago, sustaining a wound on his right lower leg, which got infected, but is now healing well, after a 10-day course of antibiotics.     Allergies:   Allergies  Allergen Reactions  . Horse-Derived  Products Hives      Past Medical History  Diagnosis Date  . CAD (coronary artery disease)     s/p NSTEMI and BMS stent diagonal07/09;  Lexiscan Myoview 9/13:  EF 62%, no ischemia  . AS (aortic stenosis)     a.  Echo 2009 mean gradient 9mm HG. AVA 2.18;  b.  Echo 4/11 mild AS mean gradient 10mm HG;  c.  Echo 04/2011: Mild LVH, EF 55-60%, grade 1 diastolic dysfunction, mild aortic stenosis, mean gradient 14, mild LAE;  d. Echo 9/13: mod LVH, EF 50-55%, mild to mod AS, mean gradient 17 mmHg  . AF (paroxysmal atrial fibrillation)     Holter monitor 2.5 sec pauses  . Fatigue     CPX 11/09 VO2  14.4 (75% predicte)d, slope 35.  O2 pulse normal.  VO2 corrected for body weight 17.6.  Marland Kitchen Hypothyroidism   . Hyperlipidemia   . History of prostate cancer   . Allergic rhinitis   . DM (diabetes mellitus)     type II diet controlled  . Shingles   . Left carotid stenosis   . Vitamin B 12 deficiency   . Anemia   . Osteoarthritis   . Nephrolithiasis   . Obesity   . OSA (obstructive sleep apnea)     AHI 15 PSG 09/06/08  . Hx of colonoscopy   . Stroke   . Anxiety and depression   . PMR (polymyalgia rheumatica) 05/29/2012  . Hypogonadism male 05/29/2012  . Hyperparathyroidism 05/29/2012  . TIA (transient ischemic attack) 05/29/2012  . CKD (chronic kidney disease) stage 4, GFR 15-29 ml/min 05/29/2012  . COPD (chronic obstructive pulmonary disease) 05/29/2012  Past Surgical History  Procedure Date  . Appendectomy   . Hernia repair   . Right carpal tunnel release   . Lumbar spine surgery   . Cystectomy     neck  . Prostatectomy   . Vasectomy   . Total knee arthroplasty     right  . Cholecystectomy     Prior to Admission medications   Medication Sig Start Date End Date Taking? Authorizing Provider  Ascorbic Acid (VITAMIN C) 500 MG tablet Take 500 mg by mouth daily.     Yes Historical Provider, MD  cholecalciferol (VITAMIN D) 1000 UNITS tablet Take 1,000 Units by mouth daily.   Yes Historical  Provider, MD  clopidogrel (PLAVIX) 75 MG tablet Take 1 tablet (75 mg total) by mouth daily. 09/12/12  Yes [name], MD  Cyanocobalamin (VITAMIN B-12 IJ) Inject 1,000 mcg into the muscle every 14 (fourteen) days. Every 2 weeks   Yes Historical Provider, MD  cyclobenzaprine (FLEXERIL) 10 MG tablet Take 5-10 mg by mouth 3 (three) times daily as needed. Muscle spasms.   Yes Historical Provider, MD  folic acid (FOLVITE) 1 MG tablet Take 1 mg by mouth daily.   Yes Historical Provider, MD  furosemide (LASIX) 20 MG tablet Take 20 mg by mouth daily.    Yes Historical Provider, MD  gabapentin (NEURONTIN) 300 MG capsule Take 1 capsule (300 mg total) by mouth at bedtime. 06/25/12  Yes [name], MD  hydrALAZINE (APRESOLINE) 25 MG tablet 1/2 tab by mouth twice per day 05/29/12  Yes [name], MD  HYDROcodone-acetaminophen (VICODIN) 5-500 MG per tablet Take 1 tablet by mouth every 6 (six) hours as needed. Pain   Yes Historical Provider, MD  hydrOXYzine (ATARAX/VISTARIL) 10 MG tablet Take 10 mg by mouth 3 (three) times daily as needed. nerves 06/25/12  Yes [name], MD  hyoscyamine (LEVSIN, ANASPAZ) 0.125 MG tablet Take 0.125 mg by mouth 4 (four) times daily as needed. Gas pain.   Yes Historical Provider, MD  levothyroxine (SYNTHROID, LEVOTHROID) 137 MCG tablet Take 1 tablet (137 mcg total) by mouth daily. 09/12/12  Yes [name], MD  methotrexate (RHEUMATREX) 2.5 MG tablet Take 10 mg by mouth once a week. Takes 4 tablets weekly on  Sunday.   Caution:Chemotherapy. Protect from light.   Yes Historical Provider, MD  pravastatin (PRAVACHOL) 40 MG tablet Take 1 tablet (40 mg total) by mouth daily. 06/25/12  Yes [name], MD  solifenacin (VESICARE) 5 MG tablet Take 5 mg by mouth daily.     Yes Historical Provider, MD  sulfamethoxazole-trimethoprim (BACTRIM DS) 800-160 MG per tablet Take 1 tablet by mouth 2 (two) times daily. 10 day course started on 11/11/12. 11/11/12 11/21/12 Yes Historical Provider, MD    traMADol (ULTRAM) 50 MG tablet Take 50-100 mg by mouth 3 (three) times daily as needed. Pain.   Yes Historical Provider, MD  traZODone (DESYREL) 100 MG tablet Take 100 mg by mouth at bedtime.   Yes Historical Provider, MD    Social History: Married with 3 offspring, one of whom is now deceased. Lives alone with wife, who is demented, and they have a Day Keeper. He reports that he quit smoking about 53 years ago. His smoking use included Cigarettes. He has a 12.5 pack-year smoking history. He has quit using smokeless tobacco. He reports that he does not drink alcohol or use illicit drugs.  Family History  Problem Relation Age of Onset  . Stroke Mother 27  .  Coronary artery disease      father, brother, sister  . Alcohol abuse Father 35    Review of Systems:  As per HPI and chief complaint. Patent denies fatigue, diminished appetite, weight loss, fever, chills, headache, blurred vision, difficulty in speaking, dysphagia, had transient retrosternal burning discomfort this AM, now resolved, otherwise, no chest pain , cough, shortness of breath, orthopnea, paroxysmal nocturnal dyspnea, nausea, diaphoresis, abdominal pain, vomiting, diarrhea, belching, hematemesis, melena, dysuria, nocturia, urinary frequency, hematochezia, lower extremity swelling, pain, or redness. The rest of the systems review is negative.  Physical Exam:  General:  Patient does not appear to be in obvious acute distress. Alert, communicative, fully oriented, talking in complete sentences, not short of breath at rest.  HEENT:  No clinical pallor, no jaundice, no conjunctival injection or discharge. Visible mucosa seems "dry, and he has a hemorrhagic blister on the hard palate, possibly about 0.5-1 cm on diameter, on an ulcerated base, with small satellite ulcers, reminiscent of aphthae.   NECK:  Supple, JVP not seen, no carotid bruits, no palpable lymphadenopathy, no palpable goiter. CHEST:  Clinically clear to auscultation,  no wheezes, no crackles. HEART:  Sounds 1 and 2 heard, normal, regular, systolic murmur at apex. ABDOMEN:  Obese, soft, non-tender, no palpable organomegaly, no palpable masses, normal bowel sounds. GENITALIA:  Not examined. LOWER EXTREMITIES:  No pitting edema, palpable peripheral pulses. Has healing wound at the lateral aspect of right lower leg, just above ankle. About 4 cm in diameter.  MUSCULOSKELETAL SYSTEM:  Generalized osteoarthritic changes, otherwise, normal. CENTRAL NERVOUS SYSTEM:  No focal neurologic deficit on gross examination.  Labs on Admission:  Results for orders placed during the hospital encounter of 11/19/12 (from the past 48 hour(s))  OCCULT BLOOD, POC DEVICE     Status: Normal   Collection Time   11/19/12  8:42 AM      Component Value Range Comment   Fecal Occult Bld NEGATIVE     CBC WITH DIFFERENTIAL     Status: Abnormal   Collection Time   11/19/12  9:35 AM      Component Value Range Comment   WBC 11.0 (*) 4.0 - 10.5 K/uL    RBC 4.34  4.22 - 5.81 MIL/uL    Hemoglobin 14.6  13.0 - 17.0 g/dL    HCT 40.9  81.1 - 91.4 %    MCV 100.0  78.0 - 100.0 fL    MCH 33.6  26.0 - 34.0 pg    MCHC 33.6  30.0 - 36.0 g/dL    RDW 78.2  95.6 - 21.3 %    Platelets 299  150 - 400 K/uL    Neutrophils Relative 80 (*) 43 - 77 %    Neutro Abs 8.8 (*) 1.7 - 7.7 K/uL    Lymphocytes Relative 14  12 - 46 %    Lymphs Abs 1.5  0.7 - 4.0 K/uL    Monocytes Relative 5  3 - 12 %    Monocytes Absolute 0.5  0.1 - 1.0 K/uL    Eosinophils Relative 2  0 - 5 %    Eosinophils Absolute 0.2  0.0 - 0.7 K/uL    Basophils Relative 0  0 - 1 %    Basophils Absolute 0.0  0.0 - 0.1 K/uL   BASIC METABOLIC PANEL     Status: Abnormal   Collection Time   11/19/12  9:35 AM      Component Value Range Comment   Sodium 135  135 -  145 mEq/L    Potassium 4.5  3.5 - 5.1 mEq/L    Chloride 99  96 - 112 mEq/L    CO2 23  19 - 32 mEq/L    Glucose, Bld 109 (*) 70 - 99 mg/dL    BUN 28 (*) 6 - 23 mg/dL     Creatinine, Ser 4.09 (*) 0.50 - 1.35 mg/dL    Calcium 8.9  8.4 - 81.1 mg/dL    GFR calc non Af Amer 29 (*) >90 mL/min    GFR calc Af Amer 34 (*) >90 mL/min   URINALYSIS, ROUTINE W REFLEX MICROSCOPIC     Status: Abnormal   Collection Time   11/19/12  9:45 AM      Component Value Range Comment   Color, Urine YELLOW  YELLOW    APPearance CLEAR  CLEAR    Specific Gravity, Urine 1.022  1.005 - 1.030    pH 6.0  5.0 - 8.0    Glucose, UA NEGATIVE  NEGATIVE mg/dL    Hgb urine dipstick NEGATIVE  NEGATIVE    Bilirubin Urine NEGATIVE  NEGATIVE    Ketones, ur TRACE (*) NEGATIVE mg/dL    Protein, ur NEGATIVE  NEGATIVE mg/dL    Urobilinogen, UA 1.0  0.0 - 1.0 mg/dL    Nitrite NEGATIVE  NEGATIVE    Leukocytes, UA TRACE (*) NEGATIVE   URINE MICROSCOPIC-ADD ON     Status: Abnormal   Collection Time   11/19/12  9:45 AM      Component Value Range Comment   Squamous Epithelial / LPF FEW (*) RARE    WBC, UA 0-2  <3 WBC/hpf    Bacteria, UA RARE  RARE    Casts HYALINE CASTS (*) NEGATIVE    Crystals CA OXALATE CRYSTALS (*) NEGATIVE    Urine-Other MUCOUS PRESENT   AMORPHOUS URATES/PHOSPHATES    Radiological Exams on Admission: *RADIOLOGY REPORT*  Clinical Data: Hemoptysis. Pneumothorax.  CHEST - 2 VIEW  Comparison: 11/18/2012.  Findings: There is a small persistent right-sided pneumothorax  estimated at 10 a 15%. Persistent right basilar atelectasis and/or  contusion. The left lung remains relatively clear.  IMPRESSION:  Persistent small right-sided pneumothorax and right basilar  atelectasis.  Original Report Authenticated By: Rudie Meyer, M.D.   Assessment/Plan Active Problems:   1. Hemoptysis/Aphthous ulcer: Patient presented with hemoptysis in AM of 11/19/12, said to be phlegm, with small blood clots. He reportedly, had complained of a sore throat, with difficulty eating, on 11/16/12. Physical exam revealed a hemorrhagic blister on the hard palate, possibly about 0.5-1 cm on diameter, on  an ulcerated base, with small satellite ulcers, reminiscent of aphthae. No doubt this is the etiology of his hemoptysis, on the bases of bleeding and swallowed blood. Will manage with 2% viscous lidocaine, for comfort and observe. Have consulted Dr Emeline Darling, ENT. 2. Rib fracture/Pneumothorax, right Patient fell on 11/13/12, sustaining a right 9th rib fracture, complicated by a 15% right hydropneumothorax. CXR done today, shows interval stability, and patient is devoid of dyspnea orr pleuritic chest pain. Will observe, and repeat CXR on 11/20/12. If no new developments, patient will follow up in Dr Sharee Pimple office, as planned.  3. CAD (coronary artery disease)": Patient has known CAD, s/p NSTEMI, s/p PCI/Stent 06/2008. This appears stable and patient is on Plavix/Statin, which we shall continue.  4. PAF (paroxysmal atrial fibrillation): Currently in SR. We shall monitor telemetrically.  5. History of CVA (cerebrovascular accident)/TIA. Minimal residual deficit.  6. Diabetes mellitus: This is diet-controlled  type 2. Random glucose is normal. We shall manage with appropriate diet.  7. COPD/OSA: Stable.  9. Abdominal bloating: Patient's family expressed concern about abdominal bloating, since 11/16/12. Patient is chronically constipated, but denies abdominal pain, vomiting, and moved his bowels this AM. Physical exam is benign. We shall check a KUB.  10. AMS: Patient was said to have altered mental status, described as confusion /hallucinosis, on 11/18/12, which appears to be due to narcotic analgesics, ie Vicodin/Tramadol, which he had been precribed, for his rib fracture pain. These have been discontinued, and mental status is practically at baseline. Head CT scan of 11/18/12, showed moderate diffuse cortical atrophy and no acute intracranial abnormality. 11. CKD: Patient has a known history of CKD 4. Creatinine at presentation, was 1.94, BUN 28, against a known baseline creatinine of 1.5-1.6 in 05/2012. Suspect  mild dehydration, with AKI on CKD. Will manage with gentle iv fluids and discontinue lasix. 12.Prostate cancer: Patient is s/p prostatectomy. Not problematic at this time.  Further management will depend on clinical course.   Comment: Have discussed with patient's family at the bedside, and they confirmed that patient is DNR/DNI.   Note: Main contact is son, nickey, kloepfer: (C) [phone] or (H) [phone].    Time Spent on Admission: 1 hour  Fallynn Gravett,CHRISTOPHER 11/19/2012, 2:13 PM

## 2012-11-19 NOTE — ED Notes (Signed)
WUJ:[id number]<JY> Expected date:<BR> Expected time:<BR> Means of arrival:<BR> Comments:<BR> Coughing up blood/here yesterday for same

## 2012-11-19 NOTE — ED Notes (Signed)
Pt given urinal. Unable to provide sample at this time.

## 2012-11-19 NOTE — Consult Note (Signed)
[name], Grape [id number] [birth year] Don Norman, MD  Reason for Consult: hemoptysis, left palate lesion  HPI: admitted today after patient had hemoptysis last night. Medicine MD noted a vascular appearing lesion on the left palate and ENT is consulted for this.  Allergies:  Allergies  Allergen Reactions  . Horse-Derived Products Hives    ROS: hemoptysis, sore throat, otherwise negative x 10 systems except per HPI  PMH:  Past Medical History  Diagnosis Date  . CAD (coronary artery disease)     s/p NSTEMI and BMS stent diagonal07/09;  Lexiscan Myoview 9/13:  EF 62%, no ischemia  . AS (aortic stenosis)     a.  Echo 2009 mean gradient 9mm HG. AVA 2.18;  b.  Echo 4/11 mild AS mean gradient 10mm HG;  c.  Echo 04/2011: Mild LVH, EF 55-60%, grade 1 diastolic dysfunction, mild aortic stenosis, mean gradient 14, mild LAE;  d. Echo 9/13: mod LVH, EF 50-55%, mild to mod AS, mean gradient 17 mmHg  . AF (paroxysmal atrial fibrillation)     Holter monitor 2.5 sec pauses  . Fatigue     CPX 11/09 VO2  14.4 (75% predicte)d, slope 35.  O2 pulse normal.  VO2 corrected for body weight 17.6.  Marland Kitchen Hypothyroidism   . Hyperlipidemia   . History of prostate cancer   . Allergic rhinitis   . DM (diabetes mellitus)     type II diet controlled  . Shingles   . Left carotid stenosis   . Vitamin B 12 deficiency   . Anemia   . Osteoarthritis   . Nephrolithiasis   . Obesity   . OSA (obstructive sleep apnea)     AHI 15 PSG 09/06/08  . Hx of colonoscopy   . Stroke   . Anxiety and depression   . PMR (polymyalgia rheumatica) 05/29/2012  . Hypogonadism male 05/29/2012  . Hyperparathyroidism 05/29/2012  . TIA (transient ischemic attack) 05/29/2012  . CKD (chronic kidney disease) stage 4, GFR 15-29 ml/min 05/29/2012  . COPD (chronic obstructive pulmonary disease) 05/29/2012    FH:  Family History  Problem Relation Age of Onset  . Stroke Mother 68  . Coronary artery disease      father, brother, sister  . Alcohol  abuse Father 69    SH:  History   Social History  . Marital Status: Married    Spouse Name: N/A    Number of Children: N/A  . Years of Education: N/A   Occupational History  . retired    Social History Main Topics  . Smoking status: Former Smoker -- 0.5 packs/day for 25 years    Types: Cigarettes    Quit date: 12/25/1958  . Smokeless tobacco: Former Neurosurgeon     Comment: quit in 1950s  . Alcohol Use: No  . Drug Use: No  . Sexually Active: No   Other Topics Concern  . Not on file   Social History Narrative  . No narrative on file    PSH:  Past Surgical History  Procedure Date  . Appendectomy   . Hernia repair   . Right carpal tunnel release   . Lumbar spine surgery   . Cystectomy     neck  . Prostatectomy   . Vasectomy   . Total knee arthroplasty     right  . Cholecystectomy     Meds:  No current facility-administered medications on file prior to encounter.   Current Outpatient Prescriptions on File Prior to Encounter  Medication Sig  Dispense Refill  . Ascorbic Acid (VITAMIN C) 500 MG tablet Take 500 mg by mouth daily.        . cholecalciferol (VITAMIN D) 1000 UNITS tablet Take 1,000 Units by mouth daily.      . clopidogrel (PLAVIX) 75 MG tablet Take 1 tablet (75 mg total) by mouth daily.  90 tablet  3  . Cyanocobalamin (VITAMIN B-12 IJ) Inject 1,000 mcg into the muscle every 14 (fourteen) days. Every 2 weeks      . cyclobenzaprine (FLEXERIL) 10 MG tablet Take 5-10 mg by mouth 3 (three) times daily as needed. Muscle spasms.      . folic acid (FOLVITE) 1 MG tablet Take 1 mg by mouth daily.      . furosemide (LASIX) 20 MG tablet Take 20 mg by mouth daily.       Marland Kitchen gabapentin (NEURONTIN) 300 MG capsule Take 1 capsule (300 mg total) by mouth at bedtime.  30 capsule  5  . hydrALAZINE (APRESOLINE) 25 MG tablet 1/2 tab by mouth twice per day  90 tablet  3  . HYDROcodone-acetaminophen (VICODIN) 5-500 MG per tablet Take 1 tablet by mouth every 6 (six) hours as needed.  Pain      . hydrOXYzine (ATARAX/VISTARIL) 10 MG tablet Take 10 mg by mouth 3 (three) times daily as needed. nerves      . hyoscyamine (LEVSIN, ANASPAZ) 0.125 MG tablet Take 0.125 mg by mouth 4 (four) times daily as needed. Gas pain.      Marland Kitchen levothyroxine (SYNTHROID, LEVOTHROID) 137 MCG tablet Take 1 tablet (137 mcg total) by mouth daily.  90 tablet  3  . methotrexate (RHEUMATREX) 2.5 MG tablet Take 10 mg by mouth once a week. Takes 4 tablets weekly on  Sunday.   Caution:Chemotherapy. Protect from light.      . pravastatin (PRAVACHOL) 40 MG tablet Take 1 tablet (40 mg total) by mouth daily.  30 tablet  5  . solifenacin (VESICARE) 5 MG tablet Take 5 mg by mouth daily.        Marland Kitchen sulfamethoxazole-trimethoprim (BACTRIM DS) 800-160 MG per tablet Take 1 tablet by mouth 2 (two) times daily. 10 day course started on 11/11/12.      Marland Kitchen traMADol (ULTRAM) 50 MG tablet Take 50-100 mg by mouth 3 (three) times daily as needed. Pain.      . traZODone (DESYREL) 100 MG tablet Take 100 mg by mouth at bedtime.       Scheduled Meds:   . cholecalciferol  1,000 Units Oral Daily  . clopidogrel  75 mg Oral Daily  . darifenacin  7.5 mg Oral Daily  . docusate sodium  100 mg Oral BID  . folic acid  1 mg Oral Daily  . gabapentin  300 mg Oral QHS  . hydrALAZINE  12.5 mg Oral BID  . levothyroxine  137 mcg Oral QAC breakfast  . oxymetazoline  1 spray Each Nare Once  . simvastatin  20 mg Oral q1800  . sodium chloride  3 mL Intravenous Q12H  . sodium chloride  3 mL Intravenous Q12H  . traZODone  100 mg Oral QHS  . vitamin C  500 mg Oral Daily   Continuous Infusions:   . sodium chloride 75 mL/hr (11/19/12 1523)   PRN Meds:.sodium chloride, acetaminophen, acetaminophen, hydrOXYzine, hyoscyamine, lidocaine, morphine injection, ondansetron (ZOFRAN) IV, ondansetron, sodium chloride  Physical  Exam: CN 2-12 grossly intact and symmetric. Grossly normal voice. EAC/TMs normal BL. Oral cavity/oropharynx without masse other than  a left soft palate mucosal lesion just superior to the uvula measuring about 1cm with an ulcer at its base and some surrounding vascular ectasias and erythema. Skin warm and dry. Nasal cavity without polyps or purulence. External nose and ears without masses or lesions. EOMI, PERRLA. Neck supple with no masses or lesions. No lymphadenopathy palpated.  Procedure Note: 31575 Informed verbal consent was obtained after explaining the risks (including bleeding and infection), benefits and alternatives of the procedure. Verbal timeout was performed prior to the procedure. The 4mm flexible scope was advanced through the left then right nasal cavity. The septum and turbinates appeared normal bilaterally. The middle meatus was free of polyps or purulence, and no masses or clots or active nasal bleeding was seen. The eustachian tube, choana, and adenoids were normal in appearance. The hypopharynx, arytenoids, false vocal folds, and true vocal folds appeared normal with normal TVF adduction and abduction and no laryngeal masses or lesions. The visualized portion of the subglottis appeared normal. The patient tolerated the procedure with no immediate complications.  Procedure Note: destruction of left soft palate lesion with silver nitrate 42808- the tongue was retracted use the tongue depressor and the left palatal ulcer/lesion was visualized using a headlight. Two sticks of silver nitrate were used to thoroughly cauterize the lesion and surrounding tissue, with good hemostasis noted. The patient tolerated the procedure well with no immediate complications.  A/P: hemoptysis s/p flexible laryngoscopy/nasal endoscopy and cauterization/destruction of the left palatal ulcer/lesion. Patient should get good hemostasis from the cauterization and no other pharyngeal/laryngeal lesions/sources of bleeding were seen. He can follow up with me at [facility] in 2-3 weeks for repeat exam and possible outpatient definitive excision  of the lesion. If he has some sore throat/aphthous ulcers he can use some magic mouthwash PRN.   Melvenia Beam 11/19/2012 5:56 PM

## 2012-11-20 ENCOUNTER — Observation Stay (HOSPITAL_COMMUNITY): Payer: Medicare Other

## 2012-11-20 DIAGNOSIS — R404 Transient alteration of awareness: Secondary | ICD-10-CM

## 2012-11-20 DIAGNOSIS — N179 Acute kidney failure, unspecified: Secondary | ICD-10-CM | POA: Diagnosis present

## 2012-11-20 DIAGNOSIS — IMO0002 Reserved for concepts with insufficient information to code with codable children: Secondary | ICD-10-CM | POA: Diagnosis present

## 2012-11-20 DIAGNOSIS — L0390 Cellulitis, unspecified: Secondary | ICD-10-CM | POA: Diagnosis present

## 2012-11-20 DIAGNOSIS — E119 Type 2 diabetes mellitus without complications: Secondary | ICD-10-CM

## 2012-11-20 LAB — RAPID URINE DRUG SCREEN, HOSP PERFORMED
Amphetamines: NOT DETECTED
Barbiturates: NOT DETECTED
Benzodiazepines: NOT DETECTED
Cocaine: NOT DETECTED
Opiates: NOT DETECTED
Tetrahydrocannabinol: NOT DETECTED

## 2012-11-20 LAB — CBC
MCV: 100 fL (ref 78.0–100.0)
Platelets: 310 10*3/uL (ref 150–400)
RBC: 4.1 MIL/uL — ABNORMAL LOW (ref 4.22–5.81)
WBC: 9.1 10*3/uL (ref 4.0–10.5)

## 2012-11-20 LAB — COMPREHENSIVE METABOLIC PANEL
Albumin: 2.7 g/dL — ABNORMAL LOW (ref 3.5–5.2)
BUN: 23 mg/dL (ref 6–23)
Chloride: 104 mEq/L (ref 96–112)
Creatinine, Ser: 1.71 mg/dL — ABNORMAL HIGH (ref 0.50–1.35)
GFR calc non Af Amer: 34 mL/min — ABNORMAL LOW (ref >90)
Total Bilirubin: 0.6 mg/dL (ref 0.3–1.2)

## 2012-11-20 MED ORDER — SODIUM CHLORIDE 0.9 % IV SOLN: INTRAVENOUS | Status: AC

## 2012-11-20 MED ORDER — HYDRALAZINE HCL 20 MG/ML IJ SOLN
5.0000 mg | Freq: Once | INTRAMUSCULAR | Status: AC
  Administered 2012-11-20: 5 mg via INTRAVENOUS
  Filled 2012-11-20: qty 1

## 2012-11-20 MED ORDER — HALOPERIDOL LACTATE 5 MG/ML IJ SOLN: 2.0000 mg | Freq: Four times a day (QID) | INTRAMUSCULAR | Status: DC | PRN

## 2012-11-20 MED ORDER — POLYETHYLENE GLYCOL 3350 17 G PO PACK: 17.0000 g | PACK | Freq: Every day | ORAL | Status: DC

## 2012-11-20 MED ORDER — DOXYCYCLINE HYCLATE 100 MG IV SOLR
100.0000 mg | Freq: Two times a day (BID) | INTRAVENOUS | Status: DC
  Administered 2012-11-20 – 2012-11-21 (×2): 100 mg via INTRAVENOUS
  Filled 2012-11-20 (×3): qty 100

## 2012-11-20 NOTE — Progress Notes (Signed)
OT Cancellation Note  Patient Details Name: JAVONTA GRONAU MRN: [id number] DOB: 02-23-1923   Cancelled Treatment:    Reason Eval/Treat Not Completed: Patient's level of consciousness. Pt sedated, in restraints. Was agitated last night. Will check back another time.  Ardis Lawley A OTR/L [phone] 11/20/2012, 9:45 AM

## 2012-11-20 NOTE — Progress Notes (Signed)
[facility] PROGRESS NOTE   Assessment/Plan: Hemoptysis (11/19/2012) - likely 2/2 Aphthous ulcer. - S/p cauterization 11.26.2013 by ENT: recommended follow up with me at [facility] in 2-3 weeks for repeat exam and possible outpatient definitive excision of the lesion. - magic mouthwash  Acute delirium: - most likely 2/2 new environment BZD's. - Haldol PNR, check and EKG and electrolytes. - b-met and EKG in am. - Avoid Narcotics and BZD's. - Ct head 11.25.2013no IC abnormalities.  Right foot Cellulitis/ulcer: - doxy IV. Blood cultures. - wound care consult.  AKI: - Baseline creatinine 1.5. Peaked 1.9 - Cont NS, repeat b-met in am.  Pneumothorax, right / Rib Fracture: Larey Seat on 11/13/12, sustaining a right 9th rib fracture, complicated by a 15% right hydropneumothorax.  - CXR done 11.25.2013, shows interval stability, and patient is devoid of dyspnea orr pleuritic chest pain. Will observe, and repeat CXR on 11/20/12. If no new developments, patient will follow up in Dr Sharee Pimple office, as planned  CAD (coronary artery disease) (11/19/2012) - Known CAD, s/p NSTEMI, s/p PCI/Stent 06/2008. This appears stable and patient is on Plavix/Statin  PAF (paroxysmal atrial fibrillation) (11/19/2012) - Currently in SR  Diabetes mellitus (11/19/2012) - We shall manage with appropriate diet   Code Status: full Family Communication: Keshun Berrett, tel: (C) [phone] or (H) [phone].  Disposition Plan: home   Consultants:  Dr Sharee Pimple office Dr. Emeline Darling ENT  Procedures:  none  Antibiotics:  doxy (indicate start date, and stop date if known)  HPI/Subjective: Patient more alert today.  Wants to go home   Objective: Filed Vitals:   11/19/12 1528 11/19/12 1616 11/19/12 2135 11/20/12 0455  BP: 167/96 167/86 171/93 182/93  Pulse: 91 89 97 98  Temp: 99.1 F (37.3 C) 98.4 F (36.9 C) 98.2 F (36.8 C) 97.9 F (36.6 C)  TempSrc: Oral Oral Oral Oral  Resp:  20 20 20 22   Height:  5\' 8"  (1.727 m)    Weight:  90.5 kg (199 lb 8.3 oz)    SpO2: 92%  92% 94%    Intake/Output Summary (Last 24 hours) at 11/20/12 1006 Last data filed at 11/20/12 0657  Gross per 24 hour  Intake 1336.25 ml  Output    825 ml  Net 511.25 ml   Filed Weights   11/19/12 1616  Weight: 90.5 kg (199 lb 8.3 oz)    Exam:  General: Alert, awake, oriented x3, in no acute distress.  HEENT: No bruits, no goiter.  Heart: Regular rate and rhythm, without murmurs, rubs, gallops.  Lungs: Good air movement, bilateral air movement.  Abdomen: Soft, nontender, nondistended, positive bowel sounds.  Neuro: Grossly intact, nonfocal.   Data Reviewed: Basic Metabolic Panel:  Lab 11/20/12 2956 11/19/12 0935 11/18/12 1312  NA 138 135 138  K 4.4 4.5 5.2*  CL 104 99 103  CO2 24 23 28   GLUCOSE 92 109* 110*  BUN 23 28* 26*  CREATININE 1.71* 1.94* 1.89*  CALCIUM 8.4 8.9 8.9  MG -- -- --  PHOS -- -- --   Liver Function Tests:  Lab 11/20/12 0430 11/18/12 1312  AST 42* 21  ALT 19 14  ALKPHOS 76 59  BILITOT 0.6 0.5  PROT 5.6* 5.8*  ALBUMIN 2.7* 3.0*   No results found for this basename: LIPASE:5,AMYLASE:5 in the last 168 hours No results found for this basename: AMMONIA:5 in the last 168 hours CBC:  Lab 11/20/12 0430 11/19/12 0935 11/18/12 1312  WBC 9.1 11.0* 10.1  NEUTROABS --  8.8* 7.3  HGB 13.6 14.6 14.1  HCT 41.0 43.4 42.3  MCV 100.0 100.0 101.4*  PLT 310 299 298   Cardiac Enzymes:  Lab 11/18/12 1426  CKTOTAL --  CKMB --  CKMBINDEX --  TROPONINI <0.30   BNP (last 3 results) No results found for this basename: PROBNP:3 in the last 8760 hours CBG: No results found for this basename: GLUCAP:5 in the last 168 hours  No results found for this or any previous visit (from the past 240 hour(s)).   Studies: Dg Chest 2 View  11/20/2012  *RADIOLOGY REPORT*  Clinical Data: Follow up right pneumothorax.  Shortness of breath.  CHEST - 2 VIEW  Comparison:  11/19/2012  Findings: Lordotic positioning noted.  A small right apical pneumothorax appears mildly decreased in size, now approximately 5- 10% in size.a persistent atelectasis or infiltrate seen in the right lung base promptly involving the right middle lobe.  Left lung remains clear.  Heart size is stable.  IMPRESSION: Mild decrease in 5-10% right apical pneumothorax.  No significant change in right basilar atelectasis versus infiltrate.   Original Report Authenticated By: Myles Rosenthal, M.D.    Dg Chest 2 View  11/19/2012  *RADIOLOGY REPORT*  Clinical Data: Hemoptysis.  Pneumothorax.  CHEST - 2 VIEW  Comparison: 11/18/2012.  Findings: There is a small persistent right-sided pneumothorax estimated at 10 a 15%.  Persistent right basilar atelectasis and/or contusion.  The left lung remains relatively clear.  IMPRESSION: Persistent small right-sided pneumothorax and right basilar atelectasis.   Original Report Authenticated By: Rudie Meyer, M.D.    Abd 1 View (kub)  11/19/2012  *RADIOLOGY REPORT*  Clinical Data: Abdominal pain and distension  ABDOMEN - 1 VIEW  Comparison: None.  Findings: There is gaseous distension of small and large bowel, with no transition in caliber and with gas seen into the rectum. There is fecal retention throughout much of the colon.  There is a small right pleural effusion.  IMPRESSION: Nonobstructive bowel gas pattern with mild to moderate diffuse gaseous distension.   Original Report Authenticated By: Esperanza Heir, M.D.    Ct Head Wo Contrast  11/18/2012  *RADIOLOGY REPORT*  Clinical Data: Altered mental status, slurred speech.  CT HEAD WITHOUT CONTRAST  Technique:  Contiguous axial images were obtained from the base of the skull through the vertex without contrast.  Comparison: December 28, 2010.  Findings: Moderate diffuse cortical atrophy is noted.  No mass effect or midline shift is noted.  Ventricular size is within normal limits.  There is no evidence of mass lesion,  hemorrhage or acute infarction.  IMPRESSION: Moderate diffuse cortical atrophy.  No acute intracranial abnormality seen.   Original Report Authenticated By: Lupita Raider.,  M.D.    Ct Chest Wo Contrast  11/18/2012  *RADIOLOGY REPORT*  Clinical Data: Possible right pneumothorax or hydrothorax seen on portable chest radiograph  CT CHEST WITHOUT CONTRAST  Technique:  Multidetector CT imaging of the chest was performed following the standard protocol without IV contrast.  Comparison: Chest radiograph dated 11/18/2012  Findings: Small to moderate right hydropneumothorax.  Additional gas adjacent to the right heart border (series 5/images 41 and 44), favored to reflect medial pneumothorax rather than pneumomediastinum.  Compressive atelectasis in the right lower lobe.  Scarring versus atelectasis in the right middle lobe. Left lung is essentially clear.  Visualized thyroid is unremarkable.  The heart is normal in size.  No pericardial effusion.  Coronary atherosclerosis.  Atherosclerotic calcifications of the aortic arch.  Small  mediastinal lymph nodes which do not meet pathologic CT size criteria.  No suspicious axillary lymphadenopathy.  Visualized upper abdomen is notable for vascular calcifications and cholecystectomy clips.  Degenerative changes of the visualized thoracolumbar spine. Right posterolateral 9th rib fracture (series 2/image 43).  IMPRESSION: Right posterolateral 9th rib fracture.  Associated small to moderate right hydropneumothorax.  Right middle and lower lobe scarring/atelectasis.  These results were called by telephone on 11/18/2012 at 1430 hours to Dr. Gerhard Munch, who verbally acknowledged these results.   Original Report Authenticated By: Charline Bills, M.D.    Dg Chest Port 1 View  11/18/2012  *RADIOLOGY REPORT*  Clinical Data: Altered mental status and weakness. History of recent fall.  PORTABLE CHEST - 1 VIEW  Comparison: None.  Findings: Single view of the chest was obtained.   There is concern for a small right apical pneumothorax and unusual pleural-based densities at the right lung base.  Findings could be associated with a hydrothorax.  The left basilar densities may represent pleural fluid and atelectasis.  Heart and mediastinum are stable. Trachea is near midline.  There are prominent right infrahilar and right basilar densities which could be associated volume loss and cannot exclude consolidation in this area.  There is an old right anterior third rib fracture.  Difficult to exclude new fractures on this examination.  IMPRESSION: Possible right pneumothorax or hydrothorax.  Densities in the medial right lung base.  The findings may be better evaluated with a chest CT.  Left basilar densities could represent a combination of pleural fluid and atelectasis.  These results were called by telephone on 11/18/2012 at 1:48 p.m. to Dr. Jeraldine Loots, who verbally acknowledged these results.   Original Report Authenticated By: Richarda Overlie, M.D.     Scheduled Meds:   . cholecalciferol  1,000 Units Oral Daily  . clopidogrel  75 mg Oral Daily  . darifenacin  7.5 mg Oral Daily  . docusate sodium  100 mg Oral BID  . folic acid  1 mg Oral Daily  . gabapentin  300 mg Oral QHS  . [COMPLETED] haloperidol lactate  5 mg Intravenous Once  . [COMPLETED] hydrALAZINE  5 mg Intravenous Once  . hydrALAZINE  12.5 mg Oral BID  . levothyroxine  137 mcg Oral QAC breakfast  . [COMPLETED] LORazepam  0.5 mg Intravenous Once  . [COMPLETED] LORazepam  1 mg Intravenous Once  . [COMPLETED] oxymetazoline  1 spray Each Nare Once  . polyethylene glycol  17 g Oral Daily  . simvastatin  20 mg Oral q1800  . sodium chloride  3 mL Intravenous Q12H  . sodium chloride  3 mL Intravenous Q12H  . traZODone  100 mg Oral QHS  . vitamin C  500 mg Oral Daily  . [DISCONTINUED] bisacodyl  10 mg Rectal Once   Continuous Infusions:   . [EXPIRED] sodium chloride 75 mL/hr at 11/20/12 0600     FELIZ Rosine Beat  [facility] Pager [phone]. If 8PM-8AM, please contact night-coverage at [email or web address], password [facility] 11/20/2012, 10:06 AM  LOS: 1 day

## 2012-11-20 NOTE — Plan of Care (Signed)
Problem: Phase III Progression Outcomes Goal: Activity at appropriate level-compared to baseline (UP IN CHAIR FOR HEMODIALYSIS)  Outcome: Progressing Pt with low BP when he first stood 96/54

## 2012-11-20 NOTE — Progress Notes (Signed)
Patient is getting more confused, agitated and having hallucination. Reoriented, emotional support given, precautionary measures done. Spoke to his daughter on the phone, son-in-law is coming to stay with him tonight. Tama Gander NP was made aware, Ativan 0.5 mg IV given as ordered. Will continue to monitor patient.

## 2012-11-20 NOTE — Evaluation (Signed)
Physical Therapy Evaluation Patient Details Name: [name] MRN: [id number] DOB: March 08, 1923 Today's Date: 11/20/2012 Time: 4034-7425 PT Time Calculation (min): 23 min  PT Assessment / Plan / Recommendation Clinical Impression  Pt originally here with cellulitis and now admitted with hemoptysis and ulcer with history of cad, nstemi, cva, paf, and prostate Ca. He also had increased confusion and was combative, however cognition is cleared at time of eval.  Tolerated OOB to chair, however pt states he felt dizzy, took BP and was 96/54 and O2 sats on room air during transfer was 83%.  Reapplied 2L O2 with sats increasing to 100%.  Pt will benefit from skilled PT in acute venue to address deficits.  PT recommends HHPT vs ST SNF pending pt progress.      PT Assessment  Patient needs continued PT services    Follow Up Recommendations  Home health PT;SNF;Supervision/Assistance - 24 hour    Does the patient have the potential to tolerate intense rehabilitation      Barriers to Discharge Decreased caregiver support Wife has 24/7 care due to dementia, unsure of amount of assist pt would have.     Equipment Recommendations  3 in 1 bedside comode    Recommendations for Other Services     Frequency Min 3X/week    Precautions / Restrictions Precautions Precautions: Fall Precaution Comments: R rib fracture Restrictions Weight Bearing Restrictions: No   Pertinent Vitals/Pain Some pain in rib area      Mobility  Bed Mobility Bed Mobility: Supine to Sit Supine to Sit: 3: Mod assist Details for Bed Mobility Assistance: Assist for trunk to get into sitting position with max cues to not hold breath.  Transfers Transfers: Sit to Stand;Stand to Sit;Stand Pivot Transfers Sit to Stand: 3: Mod assist;From elevated surface;With upper extremity assist;From bed Stand to Sit: With upper extremity assist;4: Min assist;3: Mod assist;With armrests;To chair/3-in-1 Stand Pivot Transfers: 3: Mod  assist Details for Transfer Assistance: Assist to rise and steady with cues for hand placement, safety and technique.  took some steps from bed to chair with cues for sequencing/technique with RW.  Deferred further ambulation due to dizziness and drop in BP to 96/54.  O2 sats during transfer on room air was 83%.  Ambulation/Gait Ambulation/Gait Assistance: Not tested (comment) Assistive device: Rolling walker Stairs: No Wheelchair Mobility Wheelchair Mobility: No    Shoulder Instructions     Exercises     PT Diagnosis: Difficulty walking;Generalized weakness;Acute pain  PT Problem List: Decreased strength;Decreased activity tolerance;Decreased balance;Decreased mobility;Decreased knowledge of use of DME;Decreased safety awareness;Decreased knowledge of precautions;Pain PT Treatment Interventions: DME instruction;Gait training;Functional mobility training;Therapeutic activities;Therapeutic exercise;Balance training;Patient/family education   PT Goals Acute Rehab PT Goals PT Goal Formulation: With patient Time For Goal Achievement: 12/04/12 Potential to Achieve Goals: Good Pt will go Supine/Side to Sit: with supervision PT Goal: Supine/Side to Sit - Progress: Goal set today Pt will go Sit to Supine/Side: with supervision PT Goal: Sit to Supine/Side - Progress: Goal set today Pt will go Sit to Stand: with supervision PT Goal: Sit to Stand - Progress: Goal set today Pt will go Stand to Sit: with supervision PT Goal: Stand to Sit - Progress: Goal set today Pt will Ambulate: 51 - 150 feet;with least restrictive assistive device;with supervision PT Goal: Ambulate - Progress: Goal set today  Visit Information  Last PT Received On: 11/20/12 Assistance Needed: +2 (for safety w/ amb) PT/OT Co-Evaluation/Treatment: Yes    Subjective Data  Subjective: This feels better (to  be up in chair) Patient Stated Goal: to return home.    Prior Functioning  Home Living Lives With: Spouse;Other  (Comment) (round the clock caregiver) Available Help at Discharge: Personal care attendant (24/7) Type of Home: House Home Access: Ramped entrance Home Layout: One level Bathroom Shower/Tub: Engineer, manufacturing systems: Standard Home Adaptive Equipment: Walker - standard;Shower chair with back Prior Function Level of Independence: Independent Able to Take Stairs?: Yes Driving: Yes Vocation: Retired Musician: No difficulties Dominant Hand: Right    Cognition  Overall Cognitive Status: Appears within functional limits for tasks assessed/performed Arousal/Alertness: Awake/alert Orientation Level: Appears intact for tasks assessed Behavior During Session: [facility] for tasks performed    Extremity/Trunk Assessment Right Upper Extremity Assessment RUE ROM/Strength/Tone: Deficits RUE ROM/Strength/Tone Deficits: able to reach 90 forward flexion without hurting ribs more.  Bends elbow to reach head Left Upper Extremity Assessment LUE ROM/Strength/Tone: Within functional levels LUE Coordination:  (new tremor per family) Right Lower Extremity Assessment RLE ROM/Strength/Tone: [facility] for tasks assessed Left Lower Extremity Assessment LLE ROM/Strength/Tone: WFL for tasks assessed   Balance    End of Session PT - End of Session Equipment Utilized During Treatment: Gait belt Activity Tolerance: Patient limited by fatigue;Other (comment) (low BP) Patient left: in chair;with call bell/phone within reach;with chair alarm set;with family/visitor present Nurse Communication: Mobility status  GP     Page, Meribeth Mattes 11/20/2012, 4:38 PM

## 2012-11-20 NOTE — Progress Notes (Signed)
Patient's agitation is escalating, remained confused and hallucinating. He is trying to get out of bed, and pulling telemetry monitor, peripheral IV and urinary catheter. Safety sitter and family at bedside. Ativan 1mg  IV given, bilateral wrist restraints initiated as ordered. Will continue to monitor.

## 2012-11-20 NOTE — Progress Notes (Signed)
Nutrition Brief Note  Patient identified on the Malnutrition Screening Tool (MST) Report  Body mass index is 30.34 kg/(m^2). Pt meets criteria for overweight based on current BMI.   Current diet order is Dysphagia, patient is consuming approximately 75% of meals at this time. Labs and medications reviewed.   Pt in with NT at time of visit.  Chart reviewed and RD notes pt with 3 lbs fluctuation in wt over the past few months.  Pt reported no intake for 3 days PTA which he thought may have lead to wt loss.  No nutrition interventions warranted at this time. If nutrition issues arise, please consult RD.   Loyce Dys, MS RD LDN Clinical Inpatient Dietitian Pager: [phone] Weekend/After hours pager: [phone]

## 2012-11-20 NOTE — Progress Notes (Signed)
Patient remained to be agitated and confused. He is out of control, and not following commands. Family at bedside. Merdis Delay NP again was made aware. Haldol 5mg  IV given. Will continue to monitor.

## 2012-11-20 NOTE — Evaluation (Addendum)
Occupational Therapy Evaluation Patient Details Name: [name] MRN: [id number] DOB: 1923-04-21 Today's Date: 11/20/2012 Time: 4098-1191 OT Time Calculation (min): 23 min  OT Assessment / Plan / Recommendation Clinical Impression  This 78 year old man was brought to the ED on 11/25 with confusion and hallucinations.  He was supposed to follow up with MD in a week once he was released.  He was brought back because of hemoptysis and was admitted; pt also has cellulitis.   He has a 9th rib fx on the R side, which is not new but still bothers him.     Pt has a h/o cad, nstemi, cva, paf, and prostate Ca.  At time of eval, pt's cognition had cleared.  Eval was limited by lightheadedness.  Pt is appropriate for skilled OT in acute with min A level goals.      OT Assessment  Patient needs continued OT Services    Follow Up Recommendations  SNF (vs 24/7 depending upon progress)    Barriers to Discharge      Equipment Recommendations  3 in 1 bedside comode    Recommendations for Other Services    Frequency  Min 2X/week    Precautions / Restrictions Precautions Precautions: Fall Precaution Comments: R rib fracture Restrictions Weight Bearing Restrictions: No   Pertinent Vitals/Pain Pain R side (rib fx) when moving:  Repositioned.  When standing, pt became dizzy BP 96/54.  02 sats 83% without 02--100% with 2 liters.  Pt does not wear 02 at home:  replaced    ADL  Grooming: Performed;Wash/dry hands;Brushing hair;Set up Where Assessed - Grooming: Supported sitting Upper Body Bathing: Simulated;Set up Where Assessed - Upper Body Bathing: Supported sitting Upper Body Dressing: Simulated;Minimal assistance (lines) Where Assessed - Upper Body Dressing: Supported sitting Toilet Transfer: Simulated;Moderate assistance Toilet Transfer Method: Stand pivot Toileting - Clothing Manipulation and Hygiene: Simulated;Moderate assistance Where Assessed - Toileting Clothing Manipulation and  Hygiene: Sit to stand from 3-in-1 or toilet Transfers/Ambulation Related to ADLs: pt became lightheaded when he stood to walk:  helped him to transfer to chair ADL Comments: Did not fuly assess LB adls due to lightheadedness.  Pt is able to reach to head without increasing rib pain    OT Diagnosis: Generalized weakness  OT Problem List: Decreased strength;Decreased range of motion;Decreased activity tolerance;Impaired balance (sitting and/or standing);Decreased knowledge of use of DME or AE;Cardiopulmonary status limiting activity;Pain OT Treatment Interventions: Self-care/ADL training;Patient/family education;Therapeutic activities;DME and/or AE instruction;Balance training   OT Goals Acute Rehab OT Goals OT Goal Formulation: With patient/family Time For Goal Achievement: 12/04/12 Potential to Achieve Goals: Good ADL Goals Pt Will Perform Grooming: with min assist;Standing at sink (min guard) ADL Goal: Grooming - Progress: Goal set today Pt Will Perform Lower Body Bathing: with min assist;Sit to stand from chair;with adaptive equipment (prn) ADL Goal: Lower Body Bathing - Progress: Goal set today Pt Will Perform Lower Body Dressing: with min assist;Sit to stand from chair;with adaptive equipment (prn) ADL Goal: Lower Body Dressing - Progress: Goal set today Pt Will Transfer to Toilet: with min assist;Ambulation;3-in-1;with cueing (comment type and amount) (min cues) ADL Goal: Toilet Transfer - Progress: Goal set today Pt Will Perform Toileting - Hygiene: with min assist;Sit to stand from 3-in-1/toilet (min guard) ADL Goal: Toileting - Hygiene - Progress: Goal set today  Visit Information  Last OT Received On: 11/20/12 Assistance Needed: +2 (for safety w/ amb)    Subjective Data  Subjective: Yes, it will feel good to get up  Patient Stated Goal: pt wants to get stronger.  Discouraged about all that has happened to him lately   Prior Functioning     Home Living Lives With:  Spouse;Other (Comment) (round the clock caregiver) Available Help at Discharge: Personal care attendant (24/7) Type of Home: House Home Access: Ramped entrance Home Layout: One level Bathroom Shower/Tub: Engineer, manufacturing systems: Standard Home Adaptive Equipment: Walker - standard;Shower chair with back Prior Function Level of Independence: Independent Able to Take Stairs?: Yes Driving: Yes Vocation: Retired Musician: No difficulties Dominant Hand: Right         Vision/Perception     Cognition  Overall Cognitive Status: Appears within functional limits for tasks assessed/performed Arousal/Alertness: Awake/alert Orientation Level: Appears intact for tasks assessed Behavior During Session: [facility] for tasks performed    Extremity/Trunk Assessment Right Upper Extremity Assessment RUE ROM/Strength/Tone: Deficits RUE ROM/Strength/Tone Deficits: able to reach 90 forward flexion without hurting ribs more.  Bends elbow to reach head Left Upper Extremity Assessment LUE ROM/Strength/Tone: Within functional levels LUE Coordination:  (new tremor per family)     Mobility Bed Mobility Bed Mobility: Supine to Sit Supine to Sit: 3: Mod assist Details for Bed Mobility Assistance: cues not to hold breath Transfers Transfers: Sit to Stand Sit to Stand: 3: Mod assist     Shoulder Instructions     Exercise     Balance     End of Session OT - End of Session Activity Tolerance: Treatment limited secondary to medical complications (Comment) (decreased blood pressure) Patient left: in chair;with call bell/phone within reach;with chair alarm set;with family/visitor present Nurse Communication: Mobility status (catheter barely on)  GO     Jonty Morrical 11/20/2012, 4:24 PM Marica Otter, OTR/L [phone] 11/20/2012

## 2012-11-21 LAB — BASIC METABOLIC PANEL
BUN: 22 mg/dL (ref 6–23)
Creatinine, Ser: 1.58 mg/dL — ABNORMAL HIGH (ref 0.50–1.35)
GFR calc Af Amer: 44 mL/min — ABNORMAL LOW (ref >90)
GFR calc non Af Amer: 38 mL/min — ABNORMAL LOW (ref >90)

## 2012-11-21 MED ORDER — SULFAMETHOXAZOLE-TMP DS 800-160 MG PO TABS
1.0000 | ORAL_TABLET | Freq: Two times a day (BID) | ORAL | Status: DC
  Administered 2012-11-21 – 2012-11-22 (×3): 1 via ORAL
  Filled 2012-11-21 (×4): qty 1

## 2012-11-21 MED ORDER — SULFAMETHOXAZOLE-TMP DS 800-160 MG PO TABS: 1.0000 | ORAL_TABLET | Freq: Two times a day (BID) | ORAL | Status: DC

## 2012-11-21 NOTE — Progress Notes (Signed)
IVT was unsuccessful to restart IV. Note in chart to recommend PICC placement. Existing, leaking iv removed.

## 2012-11-21 NOTE — Discharge Summary (Addendum)
Physician Discharge Summary  [name] OZH:[id number] DOB: 1923/05/13 DOA: 11/19/2012  PCP: [name], MD  Admit date: 11/19/2012 Discharge date: 11/22/2012  Time spent: 35 minutes  Recommendations for Outpatient Follow-up:  Follow up with CT surgery in 2 weeks. Follow up with ENT in 2-4 weeks for excision. Discharge Diagnoses:  Principal Problem:  *Cellulitis Active Problems:  Hemoptysis  Aphthous ulcer  Pneumothorax, right  CAD (coronary artery disease)  PAF (paroxysmal atrial fibrillation)  History of CVA (cerebrovascular accident)  Diabetes mellitus  Ulcer  Acute kidney failure   Discharge Condition: stable  Diet recommendation: heart healthy diet  Filed Weights   11/19/12 1616  Weight: 90.5 kg (199 lb 8.3 oz)    History of present illness:  78 year old male, with known history of CAD, s/p NSTEMI, s/p PCI/Stent 06/2008, s/p CVA, previous TIA, , aortic stenosis, PAF, COPD, OSA confirmed by PSG 08/2008, PMR, hypothyroidism, dyslipidemia, DM-2, CKD-4, hyperparathyroidism, urolithiasis, allergic rhinitis, prostate cancer s/p prostatectomy, Anemia, B12 deficiency, obesity, OA, s/p lumbar spinal surgery, s/p right TKA. Patient reportedly fell on 11/18/12, suffered a fall striking the right posterolateral. He was seen at [facility] and diagnosed with a right rib fracture, treated with opioid analgesics. His family brought him to the ED on 11/18/12, with hallucinations and confusion. Chest CT scan on that date, revealed a right posterolateral 9th rib fracture a small right hydropneumothorax with atelectasis at the right base. Patient was evaluated by Dr Evelene Croon, TCTS, who felt that a chest tube is indicated at this time and most likely the hydropneumothorax will resolve without intervention. Patient was then discharged with recommendations to follow up in one week with repeat CXR. According to patient and family, confusion is much, improved but he had a restless night, an  this morning, coughed up blood. He did have some retrosternal burning discomfort at the same time, but this was relieved by kayopectate, and has not recurred. Patient denies fever, chills,or shortness of breath Family recollect that he complained of some soreness in the throat on 11/16/12, and they are concerned that his abdomen seems bloated. Patient denies abdominal pain, and last bowel movement was in AM of 11/19/12. He denies vomiting, and is chronically constipated. Patient apparently fell about 2 months ago, sustaining a wound on his right lower leg, which got infected, but is now healing well, after a 10-day course of antibiotics   Hospital Course:  Acute delirium: - most likely 2/2 new environment BZD's. Now resolved.  - Haldol PNR, QTc remain <500 EKG and electrolytes WNL.  - Avoid Narcotics and BZD's.  - Ct head 11.25.2013no IC abnormalities.  - PT/OT evaluated the patient and will need home PT/OT.  Hemoptysis (11/19/2012) - likely 2/2 Aphthous ulcer.  - S/p cauterization 11.26.2013 by ENT: recommended follow up with me at [facility] in 2-3 weeks for repeat exam and possible outpatient definitive excision of the lesion.   Right foot Cellulitis/ulcer:  - doxy IV and Blood cultures 11.27.2013. changeto oral BACTRIM on 11.28.2013  Continue for 6 days.  AKI:  - Baseline creatinine 1.5. Peaked 1.9, improving  -resolved with IV fluids  Pneumothorax, right / Rib Fracture: Larey Seat on 11/13/12, sustaining a right 9th rib fracture, complicated by a 15% right hydropneumothorax.  - CXR done 11.25.2013, shows interval stability, and patient is devoid of dyspnea orr pleuritic chest pain. Will observe, and repeat CXR on 11/20/12. If no new developments, patient will follow up in Dr Sharee Pimple office, as planned   CAD (  coronary artery disease) (11/19/2012) - Known CAD, s/p NSTEMI, s/p PCI/Stent 06/2008. This appears stable and patient is on Plavix/Statin   PAF (paroxysmal atrial fibrillation)  (11/19/2012) - Currently in SR   Diabetes mellitus (11/19/2012) - We shall manage with appropriate diet      Procedures:  c (i.e. Studies not automatically included, echos, thoracentesis, etc; not x-rays)  Consultations: S/p cauterization 11.26.2013 by ENT  Discharge Exam: Filed Vitals:   11/21/12 0857 11/21/12 1400 11/21/12 2058 11/22/12 0500  BP: 142/85 123/66 140/79 128/61  Pulse: 79 72 77 80  Temp:  97.1 F (36.2 C) 98.4 F (36.9 C) 98.2 F (36.8 C)  TempSrc:  Oral Oral Oral  Resp:  20 20 20   Height:      Weight:      SpO2:  96% 93% 93%    General: A&Ox3  Cardiovascular: RRR Respiratory: good air movement CTA b/L  Discharge Instructions      Discharge Orders    Future Appointments: Provider: Department: Dept Phone: Center:   03/04/2013 1:30 PM [name], MD [facility] [phone] [facility]     Future Orders Please Complete By Expires   Diet - low sodium heart healthy      Increase activity slowly          Medication List     As of 11/22/2012  9:04 AM    TAKE these medications         cholecalciferol 1000 UNITS tablet   Commonly known as: VITAMIN D   Take 1,000 Units by mouth daily.      clopidogrel 75 MG tablet   Commonly known as: PLAVIX   Take 1 tablet (75 mg total) by mouth daily.      cyclobenzaprine 10 MG tablet   Commonly known as: FLEXERIL   Take 5-10 mg by mouth 3 (three) times daily as needed. Muscle spasms.      folic acid 1 MG tablet   Commonly known as: FOLVITE   Take 1 mg by mouth daily.      furosemide 20 MG tablet   Commonly known as: LASIX   Take 20 mg by mouth daily.      gabapentin 300 MG capsule   Commonly known as: NEURONTIN   Take 1 capsule (300 mg total) by mouth at bedtime.      hydrALAZINE 25 MG tablet   Commonly known as: APRESOLINE   1/2 tab by mouth twice per day      HYDROcodone-acetaminophen 5-500 MG per tablet   Commonly known as: VICODIN   Take 1 tablet by mouth every  6 (six) hours as needed. Pain      hydrOXYzine 10 MG tablet   Commonly known as: ATARAX/VISTARIL   Take 10 mg by mouth 3 (three) times daily as needed. nerves      hyoscyamine 0.125 MG tablet   Commonly known as: LEVSIN, ANASPAZ   Take 0.125 mg by mouth 4 (four) times daily as needed. Gas pain.      levothyroxine 137 MCG tablet   Commonly known as: SYNTHROID, LEVOTHROID   Take 1 tablet (137 mcg total) by mouth daily.      methotrexate 2.5 MG tablet   Commonly known as: RHEUMATREX   Take 10 mg by mouth once a week. Takes 4 tablets weekly on  Sunday.   Caution:Chemotherapy. Protect from light.      pravastatin 40 MG tablet   Commonly known as: PRAVACHOL   Take 1  tablet (40 mg total) by mouth daily.      solifenacin 5 MG tablet   Commonly known as: VESICARE   Take 5 mg by mouth daily.      sulfamethoxazole-trimethoprim 800-160 MG per tablet   Commonly known as: BACTRIM DS   Take 1 tablet by mouth 2 (two) times daily. 10 day course started on 11/11/12.      traMADol 50 MG tablet   Commonly known as: ULTRAM   Take 50-100 mg by mouth 3 (three) times daily as needed. Pain.      traZODone 100 MG tablet   Commonly known as: DESYREL   Take 100 mg by mouth at bedtime.      VITAMIN B-12 IJ   Inject 1,000 mcg into the muscle every 14 (fourteen) days. Every 2 weeks      vitamin C 500 MG tablet   Commonly known as: ASCORBIC ACID   Take 500 mg by mouth daily.        Follow-up Information    Follow up with [name], MD. In 2 weeks. (hospital follow up)    Contact information:   [address] [place] [address] [phone]           The results of significant diagnostics from this hospitalization (including imaging, microbiology, ancillary and laboratory) are listed below for reference.    Significant Diagnostic Studies: Dg Chest 2 View  11/20/2012  *RADIOLOGY REPORT*  Clinical Data: Follow up right pneumothorax.  Shortness of breath.   CHEST - 2 VIEW  Comparison: 11/19/2012  Findings: Lordotic positioning noted.  A small right apical pneumothorax appears mildly decreased in size, now approximately 5- 10% in size.a persistent atelectasis or infiltrate seen in the right lung base promptly involving the right middle lobe.  Left lung remains clear.  Heart size is stable.  IMPRESSION: Mild decrease in 5-10% right apical pneumothorax.  No significant change in right basilar atelectasis versus infiltrate.   Original Report Authenticated By: Myles Rosenthal, M.D.    Dg Chest 2 View  11/19/2012  *RADIOLOGY REPORT*  Clinical Data: Hemoptysis.  Pneumothorax.  CHEST - 2 VIEW  Comparison: 11/18/2012.  Findings: There is a small persistent right-sided pneumothorax estimated at 10 a 15%.  Persistent right basilar atelectasis and/or contusion.  The left lung remains relatively clear.  IMPRESSION: Persistent small right-sided pneumothorax and right basilar atelectasis.   Original Report Authenticated By: Rudie Meyer, M.D.    Abd 1 View (kub)  11/19/2012  *RADIOLOGY REPORT*  Clinical Data: Abdominal pain and distension  ABDOMEN - 1 VIEW  Comparison: None.  Findings: There is gaseous distension of small and large bowel, with no transition in caliber and with gas seen into the rectum. There is fecal retention throughout much of the colon.  There is a small right pleural effusion.  IMPRESSION: Nonobstructive bowel gas pattern with mild to moderate diffuse gaseous distension.   Original Report Authenticated By: Esperanza Heir, M.D.    Ct Head Wo Contrast  11/18/2012  *RADIOLOGY REPORT*  Clinical Data: Altered mental status, slurred speech.  CT HEAD WITHOUT CONTRAST  Technique:  Contiguous axial images were obtained from the base of the skull through the vertex without contrast.  Comparison: December 28, 2010.  Findings: Moderate diffuse cortical atrophy is noted.  No mass effect or midline shift is noted.  Ventricular size is within normal limits.  There is no  evidence of mass lesion, hemorrhage or acute infarction.  IMPRESSION:  Moderate diffuse cortical atrophy.  No acute intracranial abnormality seen.   Original Report Authenticated By: Lupita Raider.,  M.D.    Ct Chest Wo Contrast  11/18/2012  *RADIOLOGY REPORT*  Clinical Data: Possible right pneumothorax or hydrothorax seen on portable chest radiograph  CT CHEST WITHOUT CONTRAST  Technique:  Multidetector CT imaging of the chest was performed following the standard protocol without IV contrast.  Comparison: Chest radiograph dated 11/18/2012  Findings: Small to moderate right hydropneumothorax.  Additional gas adjacent to the right heart border (series 5/images 41 and 44), favored to reflect medial pneumothorax rather than pneumomediastinum.  Compressive atelectasis in the right lower lobe.  Scarring versus atelectasis in the right middle lobe. Left lung is essentially clear.  Visualized thyroid is unremarkable.  The heart is normal in size.  No pericardial effusion.  Coronary atherosclerosis.  Atherosclerotic calcifications of the aortic arch.  Small mediastinal lymph nodes which do not meet pathologic CT size criteria.  No suspicious axillary lymphadenopathy.  Visualized upper abdomen is notable for vascular calcifications and cholecystectomy clips.  Degenerative changes of the visualized thoracolumbar spine. Right posterolateral 9th rib fracture (series 2/image 43).  IMPRESSION: Right posterolateral 9th rib fracture.  Associated small to moderate right hydropneumothorax.  Right middle and lower lobe scarring/atelectasis.  These results were called by telephone on 11/18/2012 at 1430 hours to Dr. Gerhard Munch, who verbally acknowledged these results.   Original Report Authenticated By: Charline Bills, M.D.    Dg Chest Port 1 View  11/18/2012  *RADIOLOGY REPORT*  Clinical Data: Altered mental status and weakness. History of recent fall.  PORTABLE CHEST - 1 VIEW  Comparison: None.  Findings: Single view of  the chest was obtained.  There is concern for a small right apical pneumothorax and unusual pleural-based densities at the right lung base.  Findings could be associated with a hydrothorax.  The left basilar densities may represent pleural fluid and atelectasis.  Heart and mediastinum are stable. Trachea is near midline.  There are prominent right infrahilar and right basilar densities which could be associated volume loss and cannot exclude consolidation in this area.  There is an old right anterior third rib fracture.  Difficult to exclude new fractures on this examination.  IMPRESSION: Possible right pneumothorax or hydrothorax.  Densities in the medial right lung base.  The findings may be better evaluated with a chest CT.  Left basilar densities could represent a combination of pleural fluid and atelectasis.  These results were called by telephone on 11/18/2012 at 1:48 p.m. to Dr. Jeraldine Loots, who verbally acknowledged these results.   Original Report Authenticated By: Richarda Overlie, M.D.     Microbiology: Recent Results (from the past 240 hour(s))  CULTURE, BLOOD (ROUTINE X 2)     Status: Normal (Preliminary result)   Collection Time   11/20/12 11:45 AM      Component Value Range Status Comment   Specimen Description BLOOD LEFT ARM   Final    Special Requests BOTTLES DRAWN AEROBIC AND ANAEROBIC 5CC   Final    Culture  Setup Time 11/20/2012 15:22   Final    Culture     Final    Value:        BLOOD CULTURE RECEIVED NO GROWTH TO DATE CULTURE WILL BE HELD FOR 5 DAYS BEFORE ISSUING A FINAL NEGATIVE REPORT   Report Status PENDING   Incomplete   CULTURE, BLOOD (ROUTINE X 2)     Status: Normal (Preliminary result)   Collection Time  11/20/12 11:50 AM      Component Value Range Status Comment   Specimen Description BLOOD LEFT ARM   Final    Special Requests BOTTLES DRAWN AEROBIC AND ANAEROBIC 3CC   Final    Culture  Setup Time 11/20/2012 15:23   Final    Culture     Final    Value:        BLOOD CULTURE  RECEIVED NO GROWTH TO DATE CULTURE WILL BE HELD FOR 5 DAYS BEFORE ISSUING A FINAL NEGATIVE REPORT   Report Status PENDING   Incomplete      Labs: Basic Metabolic Panel:  Lab 11/21/12 4098 11/20/12 0430 11/19/12 0935 11/18/12 1312  NA 136 138 135 138  K 4.2 4.4 4.5 5.2*  CL 104 104 99 103  CO2 26 24 23 28   GLUCOSE 97 92 109* 110*  BUN 22 23 28* 26*  CREATININE 1.58* 1.71* 1.94* 1.89*  CALCIUM 8.4 8.4 8.9 8.9  MG -- -- -- --  PHOS -- -- -- --   Liver Function Tests:  Lab 11/20/12 0430 11/18/12 1312  AST 42* 21  ALT 19 14  ALKPHOS 76 59  BILITOT 0.6 0.5  PROT 5.6* 5.8*  ALBUMIN 2.7* 3.0*   No results found for this basename: LIPASE:5,AMYLASE:5 in the last 168 hours No results found for this basename: AMMONIA:5 in the last 168 hours CBC:  Lab 11/20/12 0430 11/19/12 0935 11/18/12 1312  WBC 9.1 11.0* 10.1  NEUTROABS -- 8.8* 7.3  HGB 13.6 14.6 14.1  HCT 41.0 43.4 42.3  MCV 100.0 100.0 101.4*  PLT 310 299 298   Cardiac Enzymes:  Lab 11/18/12 1426  CKTOTAL --  CKMB --  CKMBINDEX --  TROPONINI <0.30   BNP: BNP (last 3 results) No results found for this basename: PROBNP:3 in the last 8760 hours CBG: No results found for this basename: GLUCAP:5 in the last 168 hours     Signed:  Marinda Elk  [facility] 11/22/2012, 9:04 AM

## 2012-11-21 NOTE — Progress Notes (Signed)
Attempted to restart IV twice  Unsuccessfully. Called IVT to restart as pt has antibx to run.

## 2012-11-21 NOTE — Progress Notes (Signed)
[facility] PROGRESS NOTE   Assessment/Plan: Acute delirium: - most likely 2/2 new environment BZD's. Now resolved. - Haldol PNR, QTc remain <500 EKG and electrolytes WNL. - Avoid Narcotics and BZD's. - Ct head 11.25.2013no IC abnormalities.  Hemoptysis (11/19/2012) - likely 2/2 Aphthous ulcer. - S/p cauterization 11.26.2013 by ENT: recommended follow up with me at [facility] in 2-3 weeks for repeat exam and possible outpatient definitive excision of the lesion. - magic mouthwash  Right foot Cellulitis/ulcer: - doxy IV and  Blood cultures 11.27.2013. changeto oral doxy on 11.28.2013 - wound care consult.  AKI: - Baseline creatinine 1.5. Peaked 1.9, improving  Pneumothorax, right / Rib Fracture: Larey Seat on 11/13/12, sustaining a right 9th rib fracture, complicated by a 15% right hydropneumothorax.  - CXR done 11.25.2013, shows interval stability, and patient is devoid of dyspnea orr pleuritic chest pain. Will observe, and repeat CXR on 11/20/12. If no new developments, patient will follow up in Dr Sharee Pimple office, as planned  CAD (coronary artery disease) (11/19/2012) - Known CAD, s/p NSTEMI, s/p PCI/Stent 06/2008. This appears stable and patient is on Plavix/Statin  PAF (paroxysmal atrial fibrillation) (11/19/2012) - Currently in SR  Diabetes mellitus (11/19/2012) - We shall manage with appropriate diet   Code Status: full Family Communication: Vihaan Gloss, tel: (C) [phone] or (H) [phone].  Disposition Plan: home vs SNF in am   Consultants:  Dr Sharee Pimple office Dr. Emeline Darling ENT  Procedures:  none  Antibiotics:  doxy (indicate start date, and stop date if known)  HPI/Subjective: Patient more alert today. No confusion overnight.   Objective: Filed Vitals:   11/20/12 1316 11/20/12 2032 11/21/12 0615 11/21/12 0857  BP: 170/86 135/72 136/71 142/85  Pulse: 83 86 75 79  Temp: 97.4 F (36.3 C) 97.9 F (36.6 C) 97.6 F (36.4 C)   TempSrc:  Oral Oral Oral   Resp: 18 18 18    Height:      Weight:      SpO2: 98% 96% 97%     Intake/Output Summary (Last 24 hours) at 11/21/12 0919 Last data filed at 11/21/12 0500  Gross per 24 hour  Intake   1700 ml  Output   1101 ml  Net    599 ml   Filed Weights   11/19/12 1616  Weight: 90.5 kg (199 lb 8.3 oz)    Exam:  General: Alert, awake, oriented x3, in no acute distress.  HEENT: No bruits, no goiter.  Heart: Regular rate and rhythm, without murmurs, rubs, gallops.  Lungs: Good air movement, bilateral air movement.  Abdomen: Soft, nontender, nondistended, positive bowel sounds.  Neuro: Grossly intact, nonfocal.   Data Reviewed: Basic Metabolic Panel:  Lab 11/21/12 2956 11/20/12 0430 11/19/12 0935 11/18/12 1312  NA 136 138 135 138  K 4.2 4.4 4.5 5.2*  CL 104 104 99 103  CO2 26 24 23 28   GLUCOSE 97 92 109* 110*  BUN 22 23 28* 26*  CREATININE 1.58* 1.71* 1.94* 1.89*  CALCIUM 8.4 8.4 8.9 8.9  MG -- -- -- --  PHOS -- -- -- --   Liver Function Tests:  Lab 11/20/12 0430 11/18/12 1312  AST 42* 21  ALT 19 14  ALKPHOS 76 59  BILITOT 0.6 0.5  PROT 5.6* 5.8*  ALBUMIN 2.7* 3.0*   No results found for this basename: LIPASE:5,AMYLASE:5 in the last 168 hours No results found for this basename: AMMONIA:5 in the last 168 hours CBC:  Lab 11/20/12 0430 11/19/12 0935 11/18/12 1312  WBC 9.1 11.0* 10.1  NEUTROABS -- 8.8* 7.3  HGB 13.6 14.6 14.1  HCT 41.0 43.4 42.3  MCV 100.0 100.0 101.4*  PLT 310 299 298   Cardiac Enzymes:  Lab 11/18/12 1426  CKTOTAL --  CKMB --  CKMBINDEX --  TROPONINI <0.30   BNP (last 3 results) No results found for this basename: PROBNP:3 in the last 8760 hours CBG: No results found for this basename: GLUCAP:5 in the last 168 hours  Recent Results (from the past 240 hour(s))  CULTURE, BLOOD (ROUTINE X 2)     Status: Normal (Preliminary result)   Collection Time   11/20/12 11:45 AM      Component Value Range Status Comment   Specimen  Description BLOOD LEFT ARM   Final    Special Requests BOTTLES DRAWN AEROBIC AND ANAEROBIC 5CC   Final    Culture  Setup Time 11/20/2012 15:22   Final    Culture     Final    Value:        BLOOD CULTURE RECEIVED NO GROWTH TO DATE CULTURE WILL BE HELD FOR 5 DAYS BEFORE ISSUING A FINAL NEGATIVE REPORT   Report Status PENDING   Incomplete   CULTURE, BLOOD (ROUTINE X 2)     Status: Normal (Preliminary result)   Collection Time   11/20/12 11:50 AM      Component Value Range Status Comment   Specimen Description BLOOD LEFT ARM   Final    Special Requests BOTTLES DRAWN AEROBIC AND ANAEROBIC 3CC   Final    Culture  Setup Time 11/20/2012 15:23   Final    Culture     Final    Value:        BLOOD CULTURE RECEIVED NO GROWTH TO DATE CULTURE WILL BE HELD FOR 5 DAYS BEFORE ISSUING A FINAL NEGATIVE REPORT   Report Status PENDING   Incomplete      Studies: Dg Chest 2 View  11/20/2012  *RADIOLOGY REPORT*  Clinical Data: Follow up right pneumothorax.  Shortness of breath.  CHEST - 2 VIEW  Comparison: 11/19/2012  Findings: Lordotic positioning noted.  A small right apical pneumothorax appears mildly decreased in size, now approximately 5- 10% in size.a persistent atelectasis or infiltrate seen in the right lung base promptly involving the right middle lobe.  Left lung remains clear.  Heart size is stable.  IMPRESSION: Mild decrease in 5-10% right apical pneumothorax.  No significant change in right basilar atelectasis versus infiltrate.   Original Report Authenticated By: Myles Rosenthal, M.D.    Dg Chest 2 View  11/19/2012  *RADIOLOGY REPORT*  Clinical Data: Hemoptysis.  Pneumothorax.  CHEST - 2 VIEW  Comparison: 11/18/2012.  Findings: There is a small persistent right-sided pneumothorax estimated at 10 a 15%.  Persistent right basilar atelectasis and/or contusion.  The left lung remains relatively clear.  IMPRESSION: Persistent small right-sided pneumothorax and right basilar atelectasis.   Original Report  Authenticated By: Rudie Meyer, M.D.    Abd 1 View (kub)  11/19/2012  *RADIOLOGY REPORT*  Clinical Data: Abdominal pain and distension  ABDOMEN - 1 VIEW  Comparison: None.  Findings: There is gaseous distension of small and large bowel, with no transition in caliber and with gas seen into the rectum. There is fecal retention throughout much of the colon.  There is a small right pleural effusion.  IMPRESSION: Nonobstructive bowel gas pattern with mild to moderate diffuse gaseous distension.   Original Report Authenticated By: Esperanza Heir, M.D.  Scheduled Meds:    . cholecalciferol  1,000 Units Oral Daily  . clopidogrel  75 mg Oral Daily  . darifenacin  7.5 mg Oral Daily  . docusate sodium  100 mg Oral BID  . doxycycline (VIBRAMYCIN) IV  100 mg Intravenous Q12H  . folic acid  1 mg Oral Daily  . gabapentin  300 mg Oral QHS  . hydrALAZINE  12.5 mg Oral BID  . levothyroxine  137 mcg Oral QAC breakfast  . polyethylene glycol  17 g Oral Daily  . simvastatin  20 mg Oral q1800  . sodium chloride  3 mL Intravenous Q12H  . traZODone  100 mg Oral QHS  . vitamin C  500 mg Oral Daily  . [DISCONTINUED] polyethylene glycol  17 g Oral Daily  . [DISCONTINUED] sodium chloride  3 mL Intravenous Q12H   Continuous Infusions:    . sodium chloride 100 mL/hr at 11/20/12 1100     FELIZ Rosine Beat  [facility] Pager [phone]. If 8PM-8AM, please contact night-coverage at [email or web address], password [facility] 11/21/2012, 9:19 AM  LOS: 2 days

## 2012-11-22 DIAGNOSIS — J9383 Other pneumothorax: Secondary | ICD-10-CM

## 2012-11-22 NOTE — Progress Notes (Signed)
Occupational Therapy Treatment Patient Details Name: [name] MRN: [id number] DOB: 10/17/1923 Today's Date: 11/22/2012 Time: 6644-0347 OT Time Calculation (min): 18 min  OT Assessment / Plan / Recommendation Comments on Treatment Session Pt doing much better this session. Pt with 24/7 caregiver. Recommend f/u with HHOT.    Follow Up Recommendations  Home health OT    Barriers to Discharge       Equipment Recommendations  Rolling walker with 5" wheels;3 in 1 bedside comode    Recommendations for Other Services    Frequency Min 2X/week   Plan Discharge plan remains appropriate    Precautions / Restrictions Precautions Precautions: Fall Precaution Comments: R rib fracture   Pertinent Vitals/Pain Pt denied pain    ADL  Grooming: Performed;Wash/dry hands;Brushing hair;Supervision/safety Where Assessed - Grooming: Supported standing Toilet Transfer: Research scientist (life sciences) Method: Sit to Barista: Education officer, environmental and Hygiene: Simulated;Supervision/safety Where Assessed - Engineer, mining and Hygiene: Sit to stand from 3-in-1 or toilet ADL Comments: Pt required min cues for safe manipulation around bathroom and tight spaces.    OT Diagnosis:    OT Problem List:   OT Treatment Interventions:     OT Goals ADL Goals ADL Goal: Grooming - Progress: Met ADL Goal: Toilet Transfer - Progress: Met ADL Goal: Toileting - Hygiene - Progress: Met  Visit Information  Last OT Received On: 11/22/12 Assistance Needed: +1 PT/OT Co-Evaluation/Treatment: Yes    Subjective Data  Subjective: I really want to go home today.   Prior Functioning       Cognition  Overall Cognitive Status: Appears within functional limits for tasks assessed/performed Arousal/Alertness: Awake/alert Orientation Level: Appears intact for tasks assessed Behavior During Session: [facility] for tasks performed     Mobility  Shoulder Instructions Bed Mobility Bed Mobility: Not assessed (pt in chair) Transfers Sit to Stand: 6: Modified independent (Device/Increase time);5: Supervision;With upper extremity assist;Without upper extremity assist;With armrests;From chair/3-in-1;From toilet (repeated) Stand to Sit: 5: Supervision;6: Modified independent (Device/Increase time);To chair/3-in-1;To toilet;With upper extremity assist;Without upper extremity assist;With armrests (repeated for instruction, tol and assessment) Details for Transfer Assistance: cues for hand placement, safety       Exercises      Balance Static Standing Balance Static Standing - Balance Support: No upper extremity supported;During functional activity Static Standing - Level of Assistance: 5: Stand by assistance High Level Balance High Level Balance Activites: Backward walking;Direction changes;Turns;Head turns High Level Balance Comments: with close supervision   End of Session OT - End of Session Activity Tolerance: Patient tolerated treatment well Patient left: in chair;with call bell/phone within reach;with chair alarm set  GO     Azucena Dart A OTR/L [phone] 11/22/2012, 1:47 PM

## 2012-11-22 NOTE — Progress Notes (Signed)
Patient discharged home with family, discharge instructions given and explained to patient and he verbalized understanding. Denies any pain/distress. Right leg wound/abraision from fall at home prior to admission, no S/S of infection noted(no odor or discharge noted from wound) wound bed red and moist, dressing changed this AM. F/U at home with home health. Patient accompanied home by son-inlaw.

## 2012-11-22 NOTE — Plan of Care (Signed)
Problem: Discharge Progression Outcomes Goal: Activity appropriate for discharge plan Outcome: Adequate for Discharge F/U at home with PT/OT

## 2012-11-22 NOTE — Progress Notes (Signed)
Talked to patient's son Annette Stable and daughter Lynden Ang; they are in agreement for patient to go home with [facility] services; Talked to the patient again for [facility], patient is agreeable, HHC options given, patient chose [facility], Venia Minks RN with [facility] called for arrangements. B Jayde Mcallister RN,BSN,MHA.

## 2012-11-22 NOTE — Progress Notes (Signed)
Talked to patient about DCP; patient plans to return home at discharge; offered [facility] choices, patient refused, stated "I have someone that stays with me 24 hrs". Patient also stated that he wanted to work with physical therapy one more time before he is discharged home- CM offered to arrange HHPT but the patient refused. Physical therapist is to work with patient again prior to discharge. Abelino Derrick RN,BSN,MHA

## 2012-11-22 NOTE — Progress Notes (Signed)
Physical Therapy Treatment Patient Details Name: [name] MRN: [id number] DOB: 07-Mar-1923 Today's Date: 11/22/2012 Time: 3016-0109 PT Time Calculation (min): 16 min  PT Assessment / Plan / Recommendation Comments on Treatment Session  pt doing much better this session; will need HHPT to work on high level balance and safety; discussed with pt and MD    Follow Up Recommendations  Home health PT     Does the patient have the potential to tolerate intense rehabilitation     Barriers to Discharge        Equipment Recommendations  Rolling walker with 5" wheels;3 in 1 bedside comode    Recommendations for Other Services    Frequency Min 3X/week   Plan Discharge plan remains appropriate;Frequency remains appropriate    Precautions / Restrictions Precautions Precautions: Fall Precaution Comments: R rib fracture   Pertinent Vitals/Pain No c/o pain    Mobility  Bed Mobility Bed Mobility: Not assessed (pt in chair) Transfers Transfers: Sit to Stand;Stand to Sit;Stand Pivot Transfers Sit to Stand: 6: Modified independent (Device/Increase time);5: Supervision;With upper extremity assist;Without upper extremity assist;With armrests;From chair/3-in-1;From toilet (repeated) Stand to Sit: 5: Supervision;6: Modified independent (Device/Increase time);To chair/3-in-1;To toilet;With upper extremity assist;Without upper extremity assist;With armrests (repeated for instruction, tol and assessment) Stand Pivot Transfers: 5: Supervision;4: Min guard Details for Transfer Assistance: cues for hand placement, safety Ambulation/Gait Ambulation/Gait Assistance: 5: Supervision;4: Min guard Ambulation Distance (Feet): 200 Feet (25) Assistive device: Rolling walker Ambulation/Gait Assistance Details: cues for posture and RW safety Gait Pattern: Step-through pattern;Trunk flexed    Exercises     PT Diagnosis:    PT Problem List:   PT Treatment Interventions:     PT Goals Acute Rehab PT  Goals Time For Goal Achievement: 12/04/12 Potential to Achieve Goals: Good Pt will go Sit to Stand: with supervision PT Goal: Sit to Stand - Progress: Met Pt will go Stand to Sit: with supervision PT Goal: Stand to Sit - Progress: Met Pt will Ambulate: 51 - 150 feet;with least restrictive assistive device;with supervision PT Goal: Ambulate - Progress: Met  Visit Information  Last PT Received On: 11/22/12 Assistance Needed: +1 PT/OT Co-Evaluation/Treatment: Yes    Subjective Data  Subjective: I feel good! Patient Stated Goal: to return home.    Cognition  Overall Cognitive Status: Appears within functional limits for tasks assessed/performed Arousal/Alertness: Awake/alert Orientation Level: Appears intact for tasks assessed Behavior During Session: [facility] for tasks performed    Balance  Static Standing Balance Static Standing - Balance Support: No upper extremity supported;During functional activity Static Standing - Level of Assistance: 5: Stand by assistance High Level Balance High Level Balance Activites: Backward walking;Direction changes;Turns;Head turns High Level Balance Comments: with close supervision  End of Session PT - End of Session Activity Tolerance: Patient tolerated treatment well Patient left: in chair;with call bell/phone within reach;with chair alarm set;with family/visitor present   GP     [facility] 11/22/2012, 11:24 AM

## 2012-11-25 ENCOUNTER — Telehealth: Payer: Self-pay | Admitting: Internal Medicine

## 2012-11-25 NOTE — Telephone Encounter (Signed)
Rinaldo Cloud, Home Health RN from [place] is calling for order to treat wound on right lower leg of patient.  She also needs orders approved for PT/OT and a home health aide for patient.  OFFICE PLEASE FOLLOW UP WITH CALLER IN THE AM REGARDING ORDERS

## 2012-11-26 ENCOUNTER — Telehealth: Payer: Self-pay | Admitting: Internal Medicine

## 2012-11-26 LAB — CULTURE, BLOOD (ROUTINE X 2): Culture: NO GROWTH

## 2012-11-26 NOTE — Telephone Encounter (Signed)
Patient Information:  Caller Name: [name]  Phone: [phone]  Patient: [name]  Gender: Male  DOB: [birth year]  Age: 78 Years  PCP: [name] (Adults only)   Symptoms  Reason For Call & Symptoms: Swelling to feet, ankles, and knees  Reviewed Health History In EMR: Yes  Reviewed Medications In EMR: Yes  Reviewed Allergies In EMR: Yes  Reviewed Surgeries / Procedures: Yes  Date of Onset of Symptoms: 11/22/2012  Treatments Tried: elevation of feet  Treatments Tried Worked: No  Guideline(s) Used:  Leg Swelling and Edema  Disposition Per Guideline:   See Today in Office  Reason For Disposition Reached:   Moderate swelling of both ankles (e.g., swelling extends up to the knees) AND new onset or worsening  Advice Given:  Call Back If:  Swelling becomes worse  Calf pain occurs and becomes constant  You become worse.  Office Follow Up:  Does the office need to follow up with this patient?: Yes  Instructions For The Office: Please follow up with Appointment or further instructions.  RN Note:  Tightess with breathing at rest this morning has improved since this morning. No appointments available in Epic.  Please follow up with patient with appointment time. May call [name] to arrange at [phone] or to provide further instruction for patient evalaution.

## 2012-11-26 NOTE — Telephone Encounter (Signed)
Ok for all verbal

## 2012-11-26 NOTE — Telephone Encounter (Signed)
HHRN informed of MD ok 

## 2012-11-26 NOTE — Telephone Encounter (Signed)
Ok to see regina now or asap 

## 2012-11-26 NOTE — Telephone Encounter (Signed)
Called informed the patient of MD instructions.  He stated he had no transportation today and would need to schedule tomorrow 11/27/12.  Patient agreed to do so with [facility].

## 2012-11-27 ENCOUNTER — Other Ambulatory Visit (INDEPENDENT_AMBULATORY_CARE_PROVIDER_SITE_OTHER): Payer: Medicare Other

## 2012-11-27 ENCOUNTER — Ambulatory Visit (INDEPENDENT_AMBULATORY_CARE_PROVIDER_SITE_OTHER): Payer: Medicare Other | Admitting: Internal Medicine

## 2012-11-27 ENCOUNTER — Encounter: Payer: Self-pay | Admitting: Internal Medicine

## 2012-11-27 VITALS — BP 128/64 | HR 89 | Temp 98.2°F | Ht 68.0 in | Wt 208.0 lb

## 2012-11-27 DIAGNOSIS — R601 Generalized edema: Secondary | ICD-10-CM

## 2012-11-27 DIAGNOSIS — N289 Disorder of kidney and ureter, unspecified: Secondary | ICD-10-CM

## 2012-11-27 DIAGNOSIS — E538 Deficiency of other specified B group vitamins: Secondary | ICD-10-CM

## 2012-11-27 DIAGNOSIS — R609 Edema, unspecified: Secondary | ICD-10-CM

## 2012-11-27 LAB — CBC
MCHC: 32.5 g/dL (ref 30.0–36.0)
MCV: 103.4 fl — ABNORMAL HIGH (ref 78.0–100.0)
RDW: 14.6 % (ref 11.5–14.6)

## 2012-11-27 LAB — BASIC METABOLIC PANEL
BUN: 15 mg/dL (ref 6–23)
CO2: 27 mEq/L (ref 19–32)
Glucose, Bld: 117 mg/dL — ABNORMAL HIGH (ref 70–99)
Potassium: 4.5 mEq/L (ref 3.5–5.1)
Sodium: 138 mEq/L (ref 135–145)

## 2012-11-27 LAB — HEPATIC FUNCTION PANEL
ALT: 17 U/L (ref 0–53)
AST: 29 U/L (ref 0–37)
Alkaline Phosphatase: 68 U/L (ref 39–117)
Bilirubin, Direct: 0.1 mg/dL (ref 0.0–0.3)
Total Protein: 5.8 g/dL — ABNORMAL LOW (ref 6.0–8.3)

## 2012-11-27 MED ORDER — FUROSEMIDE 20 MG PO TABS: 20.0000 mg | ORAL_TABLET | Freq: Every day | ORAL | Status: DC

## 2012-11-27 MED ORDER — CYANOCOBALAMIN 1000 MCG/ML IJ SOLN
1000.0000 ug | Freq: Once | INTRAMUSCULAR | Status: AC
  Administered 2012-11-27: 1000 ug via INTRAMUSCULAR

## 2012-11-27 NOTE — Progress Notes (Signed)
Subjective:    Patient ID: [name], male    DOB: [birth year], 78 y.o.   MRN: [id number]  HPI  Pt presents to the clinic today with c/o generalized swelling. This started 1 week ago. It is progressively getting worse. He does have CKD and is currently on Lasix 20 mg daily. He has never been evaluated by a kidney doctor. He is starting to feel more SOB over the last few days as well. He denies chest pain, chest tightness.  Review of Systems      Past Medical History  Diagnosis Date  . CAD (coronary artery disease)     s/p NSTEMI and BMS stent diagonal07/09;  Lexiscan Myoview 9/13:  EF 62%, no ischemia  . AS (aortic stenosis)     a.  Echo 2009 mean gradient 9mm HG. AVA 2.18;  b.  Echo 4/11 mild AS mean gradient 10mm HG;  c.  Echo 04/2011: Mild LVH, EF 55-60%, grade 1 diastolic dysfunction, mild aortic stenosis, mean gradient 14, mild LAE;  d. Echo 9/13: mod LVH, EF 50-55%, mild to mod AS, mean gradient 17 mmHg  . AF (paroxysmal atrial fibrillation)     Holter monitor 2.5 sec pauses  . Fatigue     CPX 11/09 VO2  14.4 (75% predicte)d, slope 35.  O2 pulse normal.  VO2 corrected for body weight 17.6.  Marland Kitchen Hypothyroidism   . Hyperlipidemia   . History of prostate cancer   . Allergic rhinitis   . DM (diabetes mellitus)     type II diet controlled  . Shingles   . Left carotid stenosis   . Vitamin B 12 deficiency   . Anemia   . Osteoarthritis   . Nephrolithiasis   . Obesity   . OSA (obstructive sleep apnea)     AHI 15 PSG 09/06/08  . Hx of colonoscopy   . Stroke   . Anxiety and depression   . PMR (polymyalgia rheumatica) 05/29/2012  . Hypogonadism male 05/29/2012  . Hyperparathyroidism 05/29/2012  . TIA (transient ischemic attack) 05/29/2012  . CKD (chronic kidney disease) stage 4, GFR 15-29 ml/min 05/29/2012  . COPD (chronic obstructive pulmonary disease) 05/29/2012    Current Outpatient Prescriptions  Medication Sig Dispense Refill  . Ascorbic Acid (VITAMIN C) 500 MG tablet Take 500  mg by mouth daily.        . cholecalciferol (VITAMIN D) 1000 UNITS tablet Take 1,000 Units by mouth daily.      . clopidogrel (PLAVIX) 75 MG tablet Take 1 tablet (75 mg total) by mouth daily.  90 tablet  3  . Cyanocobalamin (VITAMIN B-12 IJ) Inject 1,000 mcg into the muscle every 14 (fourteen) days. Every 2 weeks      . folic acid (FOLVITE) 1 MG tablet Take 1 mg by mouth daily.      . furosemide (LASIX) 20 MG tablet Take 1 tablet (20 mg total) by mouth daily.  30 tablet  2  . gabapentin (NEURONTIN) 300 MG capsule Take 1 capsule (300 mg total) by mouth at bedtime.  30 capsule  5  . hydrALAZINE (APRESOLINE) 25 MG tablet 1/2 tab by mouth twice per day  90 tablet  3  . hydrOXYzine (ATARAX/VISTARIL) 10 MG tablet Take 10 mg by mouth 3 (three) times daily as needed. nerves      . hyoscyamine (LEVSIN, ANASPAZ) 0.125 MG tablet Take 0.125 mg by mouth 4 (four) times daily as needed. Gas pain.      Marland Kitchen levothyroxine (SYNTHROID, LEVOTHROID)  137 MCG tablet Take 1 tablet (137 mcg total) by mouth daily.  90 tablet  3  . methotrexate (RHEUMATREX) 2.5 MG tablet Take 10 mg by mouth once a week. Takes 4 tablets weekly on  Sunday.   Caution:Chemotherapy. Protect from light.      . pravastatin (PRAVACHOL) 40 MG tablet Take 1 tablet (40 mg total) by mouth daily.  30 tablet  5  . solifenacin (VESICARE) 5 MG tablet Take 5 mg by mouth daily.        . traMADol (ULTRAM) 50 MG tablet Take 50-100 mg by mouth 3 (three) times daily as needed. Pain.      . traZODone (DESYREL) 100 MG tablet Take 100 mg by mouth at bedtime.      . [DISCONTINUED] furosemide (LASIX) 20 MG tablet Take 20 mg by mouth daily.       . cyclobenzaprine (FLEXERIL) 10 MG tablet Take 5-10 mg by mouth 3 (three) times daily as needed. Muscle spasms.      Marland Kitchen HYDROcodone-acetaminophen (VICODIN) 5-500 MG per tablet Take 1 tablet by mouth every 6 (six) hours as needed. Pain        Allergies  Allergen Reactions  . Bactrim (Sulfamethoxazole W-Trimethoprim) Other (See  Comments)    hallucinations  . Horse-Derived Products Hives    Family History  Problem Relation Age of Onset  . Stroke Mother 83  . Coronary artery disease      father, brother, sister  . Alcohol abuse Father 36    History   Social History  . Marital Status: Married    Spouse Name: N/A    Number of Children: N/A  . Years of Education: N/A   Occupational History  . retired    Social History Main Topics  . Smoking status: Former Smoker -- 0.5 packs/day for 25 years    Types: Cigarettes    Quit date: 12/25/1958  . Smokeless tobacco: Former Neurosurgeon     Comment: quit in 1950s  . Alcohol Use: No  . Drug Use: No  . Sexually Active: No   Other Topics Concern  . Not on file   Social History Narrative  . No narrative on file     Constitutional: Pt reports fatigue. Denies fever, malaise, headache or abrupt weight changes.  Respiratory: Pt reports mild shortness of breath. Denies difficulty breathing, cough or sputum production.   Cardiovascular: Pt reports generalized swelling. Denies chest pain, chest tightness, palpitations.  Gastrointestinal: Pt reports bloating. Denies abdominal pain, constipation, diarrhea or blood in the stool.    No other specific complaints in a complete review of systems (except as listed in HPI above).  Objective:   Physical Exam   BP 128/64  Pulse 89  Temp 98.2 F (36.8 C) (Oral)  Ht 5\' 8"  (1.727 m)  Wt 208 lb (94.348 kg)  BMI 31.63 kg/m2  SpO2 92% Wt Readings from Last 3 Encounters:  11/27/12 208 lb (94.348 kg)  11/19/12 199 lb 8.3 oz (90.5 kg)  09/19/12 202 lb (91.627 kg)    General: Appears his stated age, obese but well developed, well nourished in NAD. + 9 lb weight gain in the last 10 days. Cardiovascular: Normal rate and rhythm. S1,S2 noted.  No murmur, rubs or gallops noted. Mild JVD. No carotid bruits noted. Generalized 2+ edema noted. Pulmonary/Chest: Normal effort and crackles bilaterally. Mild DAR. No wheezes or ronchi  noted.  Abdomen: Soft and nontender. Normal bowel sounds, no bruits noted. Moderate distention noted. No masses noted.  Liver, spleen and kidneys non palpable.  BMET    Component Value Date/Time   NA 136 11/21/2012 0419   K 4.2 11/21/2012 0419   CL 104 11/21/2012 0419   CO2 26 11/21/2012 0419   GLUCOSE 97 11/21/2012 0419   BUN 22 11/21/2012 0419   CREATININE 1.58* 11/21/2012 0419   CALCIUM 8.4 11/21/2012 0419   GFRNONAA 38* 11/21/2012 0419   GFRAA 44* 11/21/2012 0419    Lipid Panel     Component Value Date/Time   CHOL 126 05/31/2012 0901   TRIG 94.0 05/31/2012 0901   HDL 50.60 05/31/2012 0901   CHOLHDL 2 05/31/2012 0901   VLDL 18.8 05/31/2012 0901   LDLCALC 57 05/31/2012 0901    CBC    Component Value Date/Time   WBC 9.1 11/20/2012 0430   RBC 4.10* 11/20/2012 0430   HGB 13.6 11/20/2012 0430   HCT 41.0 11/20/2012 0430   PLT 310 11/20/2012 0430   MCV 100.0 11/20/2012 0430   MCH 33.2 11/20/2012 0430   MCHC 33.2 11/20/2012 0430   RDW 13.8 11/20/2012 0430   LYMPHSABS 1.5 11/19/2012 0935   MONOABS 0.5 11/19/2012 0935   EOSABS 0.2 11/19/2012 0935   BASOSABS 0.0 11/19/2012 0935    Hgb A1C Lab Results  Component Value Date   HGBA1C 5.9 05/31/2012        Assessment & Plan:   Generalized edema, due to chronic kidney disease:  Will obtain labs today Increase lasix to 20 mg BID Avoid salt in your diet Referral placed for nephrology for further evaluation of kidney disease.  RTC as needed or if symptoms persist.

## 2012-11-27 NOTE — Patient Instructions (Addendum)
Edema Edema is an abnormal build-up of fluids in tissues. Because this is partly dependent on gravity (water flows to the lowest place), it is more common in the legs and thighs (lower extremities). It is also common in the looser tissues, like around the eyes. Painless swelling of the feet and ankles is common and increases as a person ages. It may affect both legs and may include the calves or even thighs. When squeezed, the fluid may move out of the affected area and may leave a dent for a few moments. CAUSES    Prolonged standing or sitting in one place for extended periods of time. Movement helps pump tissue fluid into the veins, and absence of movement prevents this, resulting in edema.   Varicose veins. The valves in the veins do not work as well as they should. This causes fluid to leak into the tissues.   Fluid and salt overload.   Injury, burn, or surgery to the leg, ankle, or foot, may damage veins and allow fluid to leak out.   Sunburn damages vessels. Leaky vessels allow fluid to go out into the sunburned tissues.   Allergies (from insect bites or stings, medications or chemicals) cause swelling by allowing vessels to become leaky.   Protein in the blood helps keep fluid in your vessels. Low protein, as in malnutrition, allows fluid to leak out.   Hormonal changes, including pregnancy and menstruation, cause fluid retention. This fluid may leak out of vessels and cause edema.   Medications that cause fluid retention. Examples are sex hormones, blood pressure medications, steroid treatment, or anti-depressants.   Some illnesses cause edema, especially heart failure, kidney disease, or liver disease.   Surgery that cuts veins or lymph nodes, such as surgery done for the heart or for breast cancer, may result in edema.  DIAGNOSIS   Your caregiver is usually easily able to determine what is causing your swelling (edema) by simply asking what is wrong (getting a history) and  examining you (doing a physical). Sometimes x-rays, EKG (electrocardiogram or heart tracing), and blood work may be done to evaluate for underlying medical illness. TREATMENT   General treatment includes:  Leg elevation (or elevation of the affected body part).   Restriction of fluid intake.   Prevention of fluid overload.   Compression of the affected body part. Compression with elastic bandages or support stockings squeezes the tissues, preventing fluid from entering and forcing it back into the blood vessels.   Diuretics (also called water pills or fluid pills) pull fluid out of your body in the form of increased urination. These are effective in reducing the swelling, but can have side effects and must be used only under your caregiver's supervision. Diuretics are appropriate only for some types of edema.  The specific treatment can be directed at any underlying causes discovered. Heart, liver, or kidney disease should be treated appropriately. HOME CARE INSTRUCTIONS    Elevate the legs (or affected body part) above the level of the heart, while lying down.   Avoid sitting or standing still for prolonged periods of time.   Avoid putting anything directly under the knees when lying down, and do not wear constricting clothing or garters on the upper legs.   Exercising the legs causes the fluid to work back into the veins and lymphatic channels. This may help the swelling go down.   The pressure applied by elastic bandages or support stockings can help reduce ankle swelling.   A low-salt diet  may help reduce fluid retention and decrease the ankle swelling.   Take any medications exactly as prescribed.  SEEK MEDICAL CARE IF:   Your edema is not responding to recommended treatments. SEEK IMMEDIATE MEDICAL CARE IF:    You develop shortness of breath or chest pain.   You cannot breathe when you lay down; or if, while lying down, you have to get up and go to the window to get your  breath.   You are having increasing swelling without relief from treatment.   You develop a fever over 102 F (38.9 C).   You develop pain or redness in the areas that are swollen.   Tell your caregiver right away if you have gained 3 lb/1.4 kg in 1 day or 5 lb/2.3 kg in a week.  MAKE SURE YOU:    Understand these instructions.   Will watch your condition.   Will get help right away if you are not doing well or get worse.  Document Released: 12/11/2005 Document Revised: 06/11/2012 Document Reviewed: 07/29/2008 [facility] Patient Information 2013 [place], [place].   Pulmonary Edema Pulmonary edema (PE) is a condition in which fluid collects in the lungs. This makes it hard to breathe. PE may be a result of the heart not pumping very well, or a result of injury.   CAUSES    Coronary artery disease (blockages in the arteries of the heart) deprives the heart muscle of oxygen, and weakens the muscle. A heart attack is a form of coronary artery disease.   High blood pressure causes the heart muscle to work harder than usual. Over time, the heart muscle may get stiff, and it starts to work less efficiently. It may also fatigue and weaken.   Viral infection of the heart (myocarditis) may weaken the heart muscle.   Metabolic conditions such as thyroid disease, excessive alcohol use, certain vitamin deficiencies, or diabetes, may also weaken the heart muscle.   Leaky or stiff heart valves may impair normal heart function.   Lung disease may strain the heart muscle.   Excessive demands on the heart such as too much salt or fluid intake. Failure to take prescribed medicines.   Lung injury from toxins such as heat or poisonous gas.   Infection in the lungs or other parts of the body.   Fluid overload caused by kidney failure or medications.  SYMPTOMS    Shortness of breath at rest or with exertion.   Grunting, wheezing or gurgling while breathing.   Feeling like you are cannot get  enough air.   Breaths are shallow and fast.   A lot of coughing with frothy or bloody mucus.   Skin may become cool, damp and turn a pale or bluish color.  DIAGNOSIS   Initial diagnosis may be based on your history, symptoms, and a physical examination. Additional tests for PE may include:  EKG.   Chest X-ray.   Blood tests.   Stress test.   Ultrasound evaluation of the heart (echocardiogram).   Evaluation by a heart doctor (cardiologist).   Test of the heart arteries to look for blockages (angiogram).   Check of blood oxygen.  TREATMENT   Treatment of PE will depend on the underlying cause and will focus first on relieving the symptoms.  Extra oxygen to make breathing easier and assist with removing mucus. This may include breathing treatments or a tube into the lungs and a breathing machine.   Medicine to help the body get rid of extra  water, usually through an IV.   IV drugs or pills to help the heart pump better.   If poor heart function is the cause, it may include:   Procedures to open blocked arteries, repair damaged heart valves, or remove some of the damaged heart muscle.   A pacemaker to help the heart pump with less effort.  HOME CARE INSTRUCTIONS    Activity Level -- Your caregiver will help you determine what type of exercise program may be helpful. It is important to maintain strength and increase it if possible. Pace your activities to avoid shortness of breath or chest pain. Rest for at least 1 hour before and after meals. Cardiac rehabilitation programs are available in some locations.   Diet -- Eat a heart healthy diet, low in salt, saturated fat, and cholesterol. Ask for help with choices.   Weight Monitoring --Record your hospital or clinic weight. When you get home, compare it to your scale and record your weight. Then, weigh yourself first thing in the morning daily, and record the weights. Tell your caregiver right away if you have gained 3 lb/1.4  kg in 1 day, 5 lb/2.3 kg in a week, or whatever amount you were told to report. Your medications may need to be adjusted.   Blood pressure monitoring should be done as often as directed. You can get a home blood pressure cuff at your drugstore. Record these values and bring them with you for your clinic visits. Notify your caregiver if you become dizzy or lightheaded upon standing up.   If you are currently a smoker, it is time to quit. Nicotine makes your heart work harder and is one of the leading causes of heart (cardiac) deaths. Do not use nicotine gum or patches before talking to your doctor.   Make an appointment with your caregiver as directed for follow-up.  SEEK IMMEDIATE MEDICAL CARE IF:    You have severe chest pain. Especially if the pain is crushing or pressure-like and spreads to the arms, back, neck, or jaw, or if you have sweating, are feeling sick to your stomach (nausea), or you are experiencing shortness of breath. THIS IS AN EMERGENCY. DO NOT wait to see if the pain will go away. Get medical help at once. Call for local emergency medical help. DO NOT drive yourself to the hospital.   Your weight increases by 3 lb/1.4 kg in 1 day or 5 lb/2.3 kg in a week.   You notice increasing shortness of breath that is unusual for you. This may happen during rest, sleep or with activity.   You develop chest pain (angina) or pain that is unusual for you.   You develop sweating or nausea that is unusual for you.   You notice more swelling in your hands, feet, ankles or abdomen.   You notice lasting (persistent) dizziness, blurred vision, headache, or unsteadiness.   You begin to cough up bloody mucus (sputum).   You are unable to sleep because it is hard to breathe.   You begin to feel a "jumping" or "fluttering" sensation (palpitations) in the chest that is unusual for you.  IMPORTANT:  Make a list of every medicine, vitamin, or herbal supplement you are taking. Keep the list with  you at all times. Show it to your caregiver at every visit and before starting a new medicine. Keep the list up to date.   Ask your caregiver or pharmacist to help you write a plan or schedule so that you  know things about each medicine such as:   Why you are taking it.   The possible side effects.   The best time of day to take it.   Foods to take with it or avoid.   When to stop taking it.   Ask your caregiver for a copy of your latest heart tracing (EKG or ECG). Keep a copy with you at all times.  Document Released: 03/03/2003 Document Revised: 03/04/2012 Document Reviewed: 02/07/2008 [facility] Patient Information 2013 [place], [place].

## 2012-11-27 NOTE — Addendum Note (Signed)
Addended by: Carin Primrose on: 11/27/2012 04:15 PM   Modules accepted: Orders

## 2012-11-28 ENCOUNTER — Telehealth: Payer: Self-pay

## 2012-11-28 ENCOUNTER — Encounter: Payer: Self-pay | Admitting: Internal Medicine

## 2012-11-28 NOTE — Telephone Encounter (Signed)
Pt informed of results.    [name], [name], Don kidney function is declining. This is why Don edema is getting worse. He will definitely benefit from seeing the kidney doctor.  [name]

## 2012-11-28 NOTE — Telephone Encounter (Signed)
[name] called to inform they cannot start OT until next week as all therapist are booked. Verbal is ok.

## 2012-11-28 NOTE — Telephone Encounter (Signed)
HHRN informed 

## 2012-11-28 NOTE — Telephone Encounter (Signed)
Verbal ok?

## 2012-11-29 ENCOUNTER — Telehealth: Payer: Self-pay | Admitting: Internal Medicine

## 2012-11-29 MED ORDER — ALBUTEROL SULFATE HFA 108 (90 BASE) MCG/ACT IN AERS: 2.0000 | INHALATION_SPRAY | Freq: Four times a day (QID) | RESPIRATORY_TRACT | Status: DC | PRN

## 2012-11-29 NOTE — Telephone Encounter (Signed)
Patients cousin informed inhaler sent in to the pharmacy

## 2012-11-29 NOTE — Telephone Encounter (Signed)
Done erx 

## 2012-11-29 NOTE — Telephone Encounter (Signed)
[name] Cousin and care giver called and stated that the office had called and gave lab results to [name], unfortunately she states he is elderly and does not remember. His children were concerned about the results and he was unable to tell them.  Reviewed HIPPA form and do not find family members listed. Called and spoke with daughter [name] [phone]. She states her brother did talk with someone today.  Encouraged them to add children to HIPP A information understanding expressed.  (#2) Care giver is requesting a refill of Ventolin inhaler for [name].  She states it is HFA Ventolin was given to him 2 years ago after a hospital admission. She states he has fluid in lungs /kidney failure/ edema in legs. Home health requested that he have this medication.  [facility]- Nurse Don Cloud  RN.  Do not see this medication on Med Rec.  Please advise.

## 2012-12-03 ENCOUNTER — Telehealth: Payer: Self-pay | Admitting: Internal Medicine

## 2012-12-03 ENCOUNTER — Telehealth: Payer: Self-pay | Admitting: *Deleted

## 2012-12-03 NOTE — Telephone Encounter (Signed)
Ok for verbal 

## 2012-12-03 NOTE — Telephone Encounter (Signed)
Patient Information: ° Caller Name: Pamela ° Phone: [phone] ° Patient: [name], [name] ° Gender: Male ° DOB: 11/06/1923 ° Age: 78 Years ° PCP: John, James (Adults only) ° ° °Symptoms ° Reason For Call & Symptoms: fall 11/30/12; c/o "dull" headache 12/03/12 ° Reviewed Health History In EMR: Yes ° Reviewed Medications In EMR: Yes ° Reviewed Allergies In EMR: Yes ° Reviewed Surgeries / Procedures: Yes ° Date of Onset of Symptoms: 12/03/2012 ° °Guideline(s) Used: ° Head Injury ° °Disposition Per Guideline:  ° See Today or Tomorrow in Office ° °Reason For Disposition Reached:  ° After 3 days and headache persists ° °Advice Given: ° Call Back If: ° Severe headache ° Extremity weakness or numbness occurs ° Slurred speech or blurred vision occurs ° Vomiting occurs ° You become worse. ° Expected Course: ° Most head trauma only causes an injury to the scalp. Pain, swelling, and bruising usually start to get better 2 to 3 days after an injury. ° Apply a Cold Pack: ° Apply a cold pack or an ice bag (wrapped in a moist towel) to the area for 20 minutes. Repeat in 1 hour, then every 4 hours while awake. ° Continue this for the first 48 hours after an injury. ° This will help decrease pain and swelling. ° Reassurance - Direct Blow (Contusion, Bruise) ° This sounds like a scalp injury rather than a brain injury or concussion. Treatment at home should be safe. A direct blow to your scalp can cause a contusion. Contusion is the medical term for bruise. ° Symptoms are mild pain, swelling, and/or bruising. Sometimes there can also be mild dizziness and nausea. ° Here is some care advice that should help. ° °Office Follow Up: ° Does the office need to follow up with this patient?: No ° Instructions For The Office: N/A ° °RN Overrode Recommendation: ° Follow Up With Office Later ° caretaker will call for appt in AM 12/04/12  krs/can ° °RN Note: ° Pamela RN/[facility] calling about headache.  States patient has c/o sinus pressure  for a week, but suffered a fall to the left side of his head 11/30/12 when getting across the room without his walker.  Onset of dull headaache wiht sinus pain a week ago, but caregiver wonders about the fall.  Patient is in his usual mentation.  Per head trauma protocol, emergent symptoms denied; advised appt within 24 hours.  Caretaker will call office in AM 12/04/12 to schedule appt.  krs/can ° ° °

## 2012-12-03 NOTE — Telephone Encounter (Signed)
Genevieve Norlander OT would like order to extend pt's OT to 1/week x 1 and 2/week x 4 for activity tolerance, to establish home exercise program and self transfer training-okay for verbal order?

## 2012-12-03 NOTE — Telephone Encounter (Signed)
Patient Information:  Caller Name: Rinaldo Cloud  Phone: [phone]  Patient: [name]  Gender: Male  DOB: 1923-08-02  Age: 78 Years  PCP: [name] (Adults only)   Symptoms  Reason For Call & Symptoms: fall 11/30/12; c/o "dull" headache 12/03/12  Reviewed Health History In EMR: Yes  Reviewed Medications In EMR: Yes  Reviewed Allergies In EMR: Yes  Reviewed Surgeries / Procedures: Yes  Date of Onset of Symptoms: 12/03/2012  Guideline(s) Used:  Head Injury  Disposition Per Guideline:   See Today or Tomorrow in Office  Reason For Disposition Reached:   After 3 days and headache persists  Advice Given:  Call Back If:  Severe headache  Extremity weakness or numbness occurs  Slurred speech or blurred vision occurs  Vomiting occurs  You become worse.  Expected Course:  Most head trauma only causes an injury to the scalp. Pain, swelling, and bruising usually start to get better 2 to 3 days after an injury.  Apply a Cold Pack:  Apply a cold pack or an ice bag (wrapped in a moist towel) to the area for 20 minutes. Repeat in 1 hour, then every 4 hours while awake.  Continue this for the first 48 hours after an injury.  This will help decrease pain and swelling.  Reassurance - Direct Blow (Contusion, Bruise)  This sounds like a scalp injury rather than a brain injury or concussion. Treatment at home should be safe. A direct blow to your scalp can cause a contusion. Contusion is the medical term for bruise.  Symptoms are mild pain, swelling, and/or bruising. Sometimes there can also be mild dizziness and nausea.  Here is some care advice that should help.  Office Follow Up:  Does the office need to follow up with this patient?: No  Instructions For The Office: N/A  RN Overrode Recommendation:  Follow Up With Office Later  caretaker will call for appt in AM 12/04/12  krs/can  RN Note:  Rinaldo Cloud RN/[facility] calling about headache.  States patient has c/o sinus pressure  for a week, but suffered a fall to the left side of his head 11/30/12 when getting across the room without his walker.  Onset of dull headaache wiht sinus pain a week ago, but caregiver wonders about the fall.  Patient is in his usual mentation.  Per head trauma protocol, emergent symptoms denied; advised appt within 24 hours.  Caretaker will call office in AM 12/04/12 to schedule appt.  krs/can

## 2012-12-04 NOTE — Telephone Encounter (Signed)
To regina now - to consider CT head r/o SDH

## 2012-12-05 ENCOUNTER — Ambulatory Visit (INDEPENDENT_AMBULATORY_CARE_PROVIDER_SITE_OTHER): Payer: Medicare Other | Admitting: Internal Medicine

## 2012-12-05 ENCOUNTER — Encounter: Payer: Self-pay | Admitting: Internal Medicine

## 2012-12-05 ENCOUNTER — Other Ambulatory Visit (INDEPENDENT_AMBULATORY_CARE_PROVIDER_SITE_OTHER): Payer: Medicare Other

## 2012-12-05 ENCOUNTER — Ambulatory Visit (INDEPENDENT_AMBULATORY_CARE_PROVIDER_SITE_OTHER)
Admission: RE | Admit: 2012-12-05 | Discharge: 2012-12-05 | Disposition: A | Discharge status: Home / Self Care | Payer: Medicare Other | Source: Ambulatory Visit | Attending: Internal Medicine | Admitting: Internal Medicine

## 2012-12-05 VITALS — BP 108/72 | HR 84 | Temp 98.3°F | Ht 68.0 in | Wt 202.2 lb

## 2012-12-05 DIAGNOSIS — W19XXXA Unspecified fall, initial encounter: Secondary | ICD-10-CM

## 2012-12-05 DIAGNOSIS — M353 Polymyalgia rheumatica: Secondary | ICD-10-CM

## 2012-12-05 DIAGNOSIS — R531 Weakness: Secondary | ICD-10-CM | POA: Insufficient documentation

## 2012-12-05 DIAGNOSIS — R5381 Other malaise: Secondary | ICD-10-CM

## 2012-12-05 DIAGNOSIS — J9383 Other pneumothorax: Secondary | ICD-10-CM

## 2012-12-05 DIAGNOSIS — J939 Pneumothorax, unspecified: Secondary | ICD-10-CM

## 2012-12-05 DIAGNOSIS — IMO0002 Reserved for concepts with insufficient information to code with codable children: Secondary | ICD-10-CM

## 2012-12-05 DIAGNOSIS — R5383 Other fatigue: Secondary | ICD-10-CM

## 2012-12-05 DIAGNOSIS — L98499 Non-pressure chronic ulcer of skin of other sites with unspecified severity: Secondary | ICD-10-CM

## 2012-12-05 DIAGNOSIS — R51 Headache: Secondary | ICD-10-CM | POA: Insufficient documentation

## 2012-12-05 DIAGNOSIS — R519 Headache, unspecified: Secondary | ICD-10-CM | POA: Insufficient documentation

## 2012-12-05 DIAGNOSIS — E538 Deficiency of other specified B group vitamins: Secondary | ICD-10-CM

## 2012-12-05 LAB — URINALYSIS, ROUTINE W REFLEX MICROSCOPIC
Nitrite: NEGATIVE
Specific Gravity, Urine: 1.015 (ref 1.000–1.030)
Total Protein, Urine: NEGATIVE
Urine Glucose: NEGATIVE
pH: 5.5 (ref 5.0–8.0)

## 2012-12-05 LAB — BASIC METABOLIC PANEL
Calcium: 8.3 mg/dL — ABNORMAL LOW (ref 8.4–10.5)
GFR: 32.74 mL/min — ABNORMAL LOW (ref >60.00)
Glucose, Bld: 115 mg/dL — ABNORMAL HIGH (ref 70–99)
Sodium: 135 mEq/L (ref 135–145)

## 2012-12-05 LAB — CBC WITH DIFFERENTIAL/PLATELET
Basophils Absolute: 0 10*3/uL (ref 0.0–0.1)
HCT: 39.6 % (ref 39.0–52.0)
Hemoglobin: 13.2 g/dL (ref 13.0–17.0)
Lymphs Abs: 3.1 10*3/uL (ref 0.7–4.0)
Monocytes Relative: 8.9 % (ref 3.0–12.0)
Neutro Abs: 6 10*3/uL (ref 1.4–7.7)
RDW: 14.6 % (ref 11.5–14.6)

## 2012-12-05 LAB — SEDIMENTATION RATE: Sed Rate: 41 mm/hr — ABNORMAL HIGH (ref 0–22)

## 2012-12-05 LAB — HEPATIC FUNCTION PANEL
Albumin: 3.3 g/dL — ABNORMAL LOW (ref 3.5–5.2)
Alkaline Phosphatase: 69 U/L (ref 39–117)
Bilirubin, Direct: 0.1 mg/dL (ref 0.0–0.3)
Total Bilirubin: 0.6 mg/dL (ref 0.3–1.2)

## 2012-12-05 MED ORDER — CYANOCOBALAMIN 1000 MCG/ML IJ SOLN
1000.0000 ug | Freq: Once | INTRAMUSCULAR | Status: AC
  Administered 2012-12-05: 1000 ug via INTRAMUSCULAR

## 2012-12-05 MED ORDER — CEPHALEXIN 500 MG PO CAPS: 500.0000 mg | ORAL_CAPSULE | Freq: Four times a day (QID) | ORAL | Status: DC

## 2012-12-05 NOTE — Telephone Encounter (Signed)
Have we been able to contact pt? And try to get to see regina?

## 2012-12-05 NOTE — Progress Notes (Signed)
Subjective:    Patient ID: [name], male    DOB: 04-Nov-1923, 78 y.o.   MRN: [id number]  HPI  Here with family for f/i; overall doing ok but c/o cont'd general weakness, lesser appetitie, and increase red/tender/swelling to area left anterior mid shin/leg in the past 2 days after striking the leg on furniture.  No fever,  Also with  Here with 3 days acute onset fever, facial pain, pressure, general weakness and malaise, and greenish d/c, with slight ST, but little to no cough and Pt denies chest pain, increased sob or doe, wheezing, orthopnea, PND, increased LE swelling, palpitations, dizziness or syncope.  Has recent pneumothorax by cxr as well.   Past Medical History  Diagnosis Date  . CAD (coronary artery disease)     s/p NSTEMI and BMS stent diagonal07/09;  Lexiscan Myoview 9/13:  EF 62%, no ischemia  . AS (aortic stenosis)     a.  Echo 2009 mean gradient 9mm HG. AVA 2.18;  b.  Echo 4/11 mild AS mean gradient 10mm HG;  c.  Echo 04/2011: Mild LVH, EF 55-60%, grade 1 diastolic dysfunction, mild aortic stenosis, mean gradient 14, mild LAE;  d. Echo 9/13: mod LVH, EF 50-55%, mild to mod AS, mean gradient 17 mmHg  . AF (paroxysmal atrial fibrillation)     Holter monitor 2.5 sec pauses  . Fatigue     CPX 11/09 VO2  14.4 (75% predicte)d, slope 35.  O2 pulse normal.  VO2 corrected for body weight 17.6.  Marland Kitchen Hypothyroidism   . Hyperlipidemia   . History of prostate cancer   . Allergic rhinitis   . DM (diabetes mellitus)     type II diet controlled  . Shingles   . Left carotid stenosis   . Vitamin B 12 deficiency   . Anemia   . Osteoarthritis   . Nephrolithiasis   . Obesity   . OSA (obstructive sleep apnea)     AHI 15 PSG 09/06/08  . Hx of colonoscopy   . Stroke   . Anxiety and depression   . PMR (polymyalgia rheumatica) 05/29/2012  . Hypogonadism male 05/29/2012  . Hyperparathyroidism 05/29/2012  . TIA (transient ischemic attack) 05/29/2012  . CKD (chronic kidney disease) stage 4, GFR  15-29 ml/min 05/29/2012  . COPD (chronic obstructive pulmonary disease) 05/29/2012   Past Surgical History  Procedure Date  . Appendectomy   . Hernia repair   . Right carpal tunnel release   . Lumbar spine surgery   . Cystectomy     neck  . Prostatectomy   . Vasectomy   . Total knee arthroplasty     right  . Cholecystectomy     reports that he quit smoking about 53 years ago. His smoking use included Cigarettes. He has a 12.5 pack-year smoking history. He has quit using smokeless tobacco. He reports that he does not drink alcohol or use illicit drugs. family history includes Alcohol abuse (age of onset:69) in his father; Coronary artery disease in an unspecified family member; and Stroke (age of onset:69) in his mother. Allergies  Allergen Reactions  . Bactrim (Sulfamethoxazole W-Trimethoprim) Other (See Comments)    hallucinations  . Horse-Derived Products Hives   Current Outpatient Prescriptions on File Prior to Visit  Medication Sig Dispense Refill  . albuterol (PROVENTIL HFA;VENTOLIN HFA) 108 (90 BASE) MCG/ACT inhaler Inhale 2 puffs into the lungs every 6 (six) hours as needed for wheezing or shortness of breath.  1 Inhaler  5  .  Ascorbic Acid (VITAMIN C) 500 MG tablet Take 500 mg by mouth daily.        . cholecalciferol (VITAMIN D) 1000 UNITS tablet Take 1,000 Units by mouth daily.      . clopidogrel (PLAVIX) 75 MG tablet Take 1 tablet (75 mg total) by mouth daily.  90 tablet  3  . Cyanocobalamin (VITAMIN B-12 IJ) Inject 1,000 mcg into the muscle every 14 (fourteen) days. Every 2 weeks      . cyclobenzaprine (FLEXERIL) 10 MG tablet Take 5-10 mg by mouth 3 (three) times daily as needed. Muscle spasms.      . folic acid (FOLVITE) 1 MG tablet Take 1 mg by mouth daily.      . furosemide (LASIX) 20 MG tablet Take 1 tablet (20 mg total) by mouth daily.  30 tablet  2  . gabapentin (NEURONTIN) 300 MG capsule Take 1 capsule (300 mg total) by mouth at bedtime.  30 capsule  5  . hydrALAZINE  (APRESOLINE) 25 MG tablet 1/2 tab by mouth twice per day  90 tablet  3  . HYDROcodone-acetaminophen (VICODIN) 5-500 MG per tablet Take 1 tablet by mouth every 6 (six) hours as needed. Pain      . hydrOXYzine (ATARAX/VISTARIL) 10 MG tablet Take 10 mg by mouth 3 (three) times daily as needed. nerves      . hyoscyamine (LEVSIN, ANASPAZ) 0.125 MG tablet Take 0.125 mg by mouth 4 (four) times daily as needed. Gas pain.      Marland Kitchen levothyroxine (SYNTHROID, LEVOTHROID) 137 MCG tablet Take 1 tablet (137 mcg total) by mouth daily.  90 tablet  3  . methotrexate (RHEUMATREX) 2.5 MG tablet Take 10 mg by mouth once a week. Takes 4 tablets weekly on  Sunday.   Caution:Chemotherapy. Protect from light.      . pravastatin (PRAVACHOL) 40 MG tablet Take 1 tablet (40 mg total) by mouth daily.  30 tablet  5  . solifenacin (VESICARE) 5 MG tablet Take 5 mg by mouth daily.        . traMADol (ULTRAM) 50 MG tablet Take 50-100 mg by mouth 3 (three) times daily as needed. Pain.      . traZODone (DESYREL) 100 MG tablet Take 100 mg by mouth at bedtime.       Review of Systems  Constitutional: Negative for diaphoresis and unexpected weight change.  HENT: Negative for tinnitus.   Eyes: Negative for photophobia and visual disturbance.  Respiratory: Negative for choking and stridor.   Gastrointestinal: Negative for vomiting and blood in stool.  Genitourinary: Negative for hematuria and decreased urine volume.  Musculoskeletal: Negative for acute joint swelling, though walks with walker Skin: Negative for color change and wound.  Neurological: Negative for tremors and numbness.  Psychiatric/Behavioral: Negative for decreased concentration. The patient is not hyperactive.       Objective:   Physical Exam BP 108/72  Pulse 84  Temp 98.3 F (36.8 C) (Oral)  Ht 5\' 8"  (1.727 m)  Wt 202 lb 4 oz (91.74 kg)  BMI 30.75 kg/m2  SpO2 92% Physical Exam  VS noted, mild ill, walks with walker Constitutional: Pt appears well-developed  and well-nourished.  HENT: Head: Normocephalic.  Right Ear: External ear normal.  Left Ear: External ear normal.  Bilat tm's mild erythema.  Sinus tender.  Pharynx mild erythema Eyes: Conjunctivae and EOM are normal. Pupils are equal, round, and reactive to light.  Neck: Normal range of motion. Neck supple.  Cardiovascular: Normal rate and regular  rhythm.   Pulmonary/Chest: Effort normal and breath sounds normal.  Neurological: Pt is alert. Not confused  Skin: Skin is warm. No erythema. except for left ant mid leg with 1.5 cm area red/tender without streaks or draiange Right lateral mid leg with 2 cm area superficial shallow wound without erythema/tender Psychiatric: Pt behavior is normal. Thought content normal.     Assessment & Plan:

## 2012-12-05 NOTE — Assessment & Plan Note (Signed)
For f/u cxr today, hopefully resolved

## 2012-12-05 NOTE — Assessment & Plan Note (Signed)
X 1 recent , has walker, cont PT

## 2012-12-05 NOTE — Patient Instructions (Addendum)
You had the B12 shot today Take all new medications as prescribed - the antibiotic Continue all other medications as before Please keep your appointments with your home health as you do, as well as Physical Therapy Please go to XRAY in the Basement for the x-ray test Please go to LAB in the Basement for the blood and/or urine tests to be done today You will be contacted by phone if any changes need to be made immediately.  Otherwise, you will receive a letter about your results with an explanation Please remember to sign up for My Chart at your earliest convenience, as this will be important to you in the future with finding out test results.

## 2012-12-05 NOTE — Telephone Encounter (Signed)
Patient has appointment today 12/05/12.

## 2012-12-05 NOTE — Assessment & Plan Note (Signed)
Persists, cont PT, for f/u labs today

## 2012-12-05 NOTE — Assessment & Plan Note (Signed)
With weakness, for f/u esr today

## 2012-12-05 NOTE — Assessment & Plan Note (Signed)
For b12 today IM 

## 2012-12-05 NOTE — Assessment & Plan Note (Signed)
Right ulcer noninflamed, cont dresssing changes per [facility], also for cephalexin course for ? Early cellultisi left mid  Leg wound,  to f/u any worsening symptoms or concerns

## 2012-12-05 NOTE — Assessment & Plan Note (Addendum)
C/w prob mild sinusitis - for cephalexin as for leg,  to f/u any worsening symptoms or concerns  Note:  Total time for pt hx, exam, review of record with pt in the room, determination of diagnoses and plan for further eval and tx is > 40 min, with over 50% spent in coordination and counseling of patient

## 2012-12-06 NOTE — Telephone Encounter (Signed)
OT informed of verbal orders.

## 2012-12-12 ENCOUNTER — Other Ambulatory Visit: Payer: Self-pay | Admitting: Internal Medicine

## 2012-12-16 ENCOUNTER — Telehealth: Payer: Self-pay | Admitting: Internal Medicine

## 2012-12-16 NOTE — Telephone Encounter (Signed)
[name] patients caregiver called to clarify medication discrepancy.  States that she was instructed by Nicki Reaper to increase Lasix to two tabs daily and script is written for 1 tab daily.  Patient will need new script if recommendation is for two tabs.  Patient also needs to receive a B-12 shot later in the week.  OFFICE NOTE: PLEASE FOLLOW UP WITH CAREGIVER.

## 2012-12-17 ENCOUNTER — Other Ambulatory Visit: Payer: Self-pay | Admitting: Internal Medicine

## 2012-12-17 MED ORDER — FUROSEMIDE 20 MG PO TABS: 20.0000 mg | ORAL_TABLET | Freq: Two times a day (BID) | ORAL | Status: DC

## 2012-12-17 NOTE — Telephone Encounter (Signed)
Ash, I fixed the lasix RX so that it is 20 mg twice a day. Thx! Rene Kocher

## 2012-12-17 NOTE — Telephone Encounter (Signed)
[name] informed of new rx for Furosemide with correct dosage.

## 2012-12-17 NOTE — Telephone Encounter (Signed)
Ok to forward to Ms [employer]

## 2012-12-19 ENCOUNTER — Telehealth: Payer: Self-pay | Admitting: Internal Medicine

## 2012-12-19 NOTE — Telephone Encounter (Signed)
[place], This is fine.  Rene Kocher

## 2012-12-19 NOTE — Telephone Encounter (Signed)
Left detailed message, told to contact office with questions

## 2012-12-19 NOTE — Telephone Encounter (Signed)
[name] is a Physical therapist with [name] and he is calling for a verbal OK to continue PT twice a week for 2-3 weeks

## 2012-12-24 ENCOUNTER — Telehealth: Payer: Self-pay | Admitting: *Deleted

## 2012-12-24 ENCOUNTER — Emergency Department (HOSPITAL_COMMUNITY): Payer: Medicare Other

## 2012-12-24 ENCOUNTER — Inpatient Hospital Stay (HOSPITAL_COMMUNITY)
Admission: EM | Admit: 2012-12-24 | Discharge: 2012-12-30 | DRG: 312 | Disposition: A | Discharge status: Skilled Nursing Facility | Payer: Medicare Other | Attending: Internal Medicine | Admitting: Internal Medicine

## 2012-12-24 ENCOUNTER — Encounter (HOSPITAL_COMMUNITY): Payer: Self-pay | Admitting: *Deleted

## 2012-12-24 DIAGNOSIS — E213 Hyperparathyroidism, unspecified: Secondary | ICD-10-CM

## 2012-12-24 DIAGNOSIS — E291 Testicular hypofunction: Secondary | ICD-10-CM

## 2012-12-24 DIAGNOSIS — D510 Vitamin B12 deficiency anemia due to intrinsic factor deficiency: Secondary | ICD-10-CM

## 2012-12-24 DIAGNOSIS — I480 Paroxysmal atrial fibrillation: Secondary | ICD-10-CM

## 2012-12-24 DIAGNOSIS — I5033 Acute on chronic diastolic (congestive) heart failure: Secondary | ICD-10-CM | POA: Diagnosis present

## 2012-12-24 DIAGNOSIS — W19XXXA Unspecified fall, initial encounter: Secondary | ICD-10-CM

## 2012-12-24 DIAGNOSIS — E875 Hyperkalemia: Secondary | ICD-10-CM

## 2012-12-24 DIAGNOSIS — F329 Major depressive disorder, single episode, unspecified: Secondary | ICD-10-CM | POA: Diagnosis present

## 2012-12-24 DIAGNOSIS — D649 Anemia, unspecified: Secondary | ICD-10-CM

## 2012-12-24 DIAGNOSIS — K625 Hemorrhage of anus and rectum: Secondary | ICD-10-CM

## 2012-12-24 DIAGNOSIS — B029 Zoster without complications: Secondary | ICD-10-CM

## 2012-12-24 DIAGNOSIS — J309 Allergic rhinitis, unspecified: Secondary | ICD-10-CM

## 2012-12-24 DIAGNOSIS — R252 Cramp and spasm: Secondary | ICD-10-CM

## 2012-12-24 DIAGNOSIS — I2510 Atherosclerotic heart disease of native coronary artery without angina pectoris: Secondary | ICD-10-CM

## 2012-12-24 DIAGNOSIS — I129 Hypertensive chronic kidney disease with stage 1 through stage 4 chronic kidney disease, or unspecified chronic kidney disease: Secondary | ICD-10-CM | POA: Diagnosis present

## 2012-12-24 DIAGNOSIS — E538 Deficiency of other specified B group vitamins: Secondary | ICD-10-CM

## 2012-12-24 DIAGNOSIS — I252 Old myocardial infarction: Secondary | ICD-10-CM

## 2012-12-24 DIAGNOSIS — R630 Anorexia: Secondary | ICD-10-CM

## 2012-12-24 DIAGNOSIS — E785 Hyperlipidemia, unspecified: Secondary | ICD-10-CM

## 2012-12-24 DIAGNOSIS — R5383 Other fatigue: Secondary | ICD-10-CM

## 2012-12-24 DIAGNOSIS — R531 Weakness: Secondary | ICD-10-CM

## 2012-12-24 DIAGNOSIS — Z96659 Presence of unspecified artificial knee joint: Secondary | ICD-10-CM

## 2012-12-24 DIAGNOSIS — R269 Unspecified abnormalities of gait and mobility: Secondary | ICD-10-CM

## 2012-12-24 DIAGNOSIS — K120 Recurrent oral aphthae: Secondary | ICD-10-CM

## 2012-12-24 DIAGNOSIS — Z9861 Coronary angioplasty status: Secondary | ICD-10-CM

## 2012-12-24 DIAGNOSIS — E119 Type 2 diabetes mellitus without complications: Secondary | ICD-10-CM

## 2012-12-24 DIAGNOSIS — R5381 Other malaise: Secondary | ICD-10-CM

## 2012-12-24 DIAGNOSIS — Z6831 Body mass index (BMI) 31.0-31.9, adult: Secondary | ICD-10-CM

## 2012-12-24 DIAGNOSIS — R627 Adult failure to thrive: Secondary | ICD-10-CM | POA: Diagnosis present

## 2012-12-24 DIAGNOSIS — E669 Obesity, unspecified: Secondary | ICD-10-CM

## 2012-12-24 DIAGNOSIS — D179 Benign lipomatous neoplasm, unspecified: Secondary | ICD-10-CM

## 2012-12-24 DIAGNOSIS — F419 Anxiety disorder, unspecified: Secondary | ICD-10-CM

## 2012-12-24 DIAGNOSIS — I350 Nonrheumatic aortic (valve) stenosis: Secondary | ICD-10-CM

## 2012-12-24 DIAGNOSIS — G4733 Obstructive sleep apnea (adult) (pediatric): Secondary | ICD-10-CM

## 2012-12-24 DIAGNOSIS — R55 Syncope and collapse: Principal | ICD-10-CM

## 2012-12-24 DIAGNOSIS — M353 Polymyalgia rheumatica: Secondary | ICD-10-CM

## 2012-12-24 DIAGNOSIS — N179 Acute kidney failure, unspecified: Secondary | ICD-10-CM

## 2012-12-24 DIAGNOSIS — L259 Unspecified contact dermatitis, unspecified cause: Secondary | ICD-10-CM

## 2012-12-24 DIAGNOSIS — I4891 Unspecified atrial fibrillation: Secondary | ICD-10-CM

## 2012-12-24 DIAGNOSIS — N19 Unspecified kidney failure: Secondary | ICD-10-CM

## 2012-12-24 DIAGNOSIS — Z8546 Personal history of malignant neoplasm of prostate: Secondary | ICD-10-CM

## 2012-12-24 DIAGNOSIS — R51 Headache: Secondary | ICD-10-CM

## 2012-12-24 DIAGNOSIS — J939 Pneumothorax, unspecified: Secondary | ICD-10-CM

## 2012-12-24 DIAGNOSIS — I951 Orthostatic hypotension: Secondary | ICD-10-CM | POA: Diagnosis present

## 2012-12-24 DIAGNOSIS — D62 Acute posthemorrhagic anemia: Secondary | ICD-10-CM

## 2012-12-24 DIAGNOSIS — I359 Nonrheumatic aortic valve disorder, unspecified: Secondary | ICD-10-CM

## 2012-12-24 DIAGNOSIS — J189 Pneumonia, unspecified organism: Secondary | ICD-10-CM

## 2012-12-24 DIAGNOSIS — L559 Sunburn, unspecified: Secondary | ICD-10-CM

## 2012-12-24 DIAGNOSIS — J4489 Other specified chronic obstructive pulmonary disease: Secondary | ICD-10-CM | POA: Diagnosis present

## 2012-12-24 DIAGNOSIS — M549 Dorsalgia, unspecified: Secondary | ICD-10-CM

## 2012-12-24 DIAGNOSIS — F3289 Other specified depressive episodes: Secondary | ICD-10-CM

## 2012-12-24 DIAGNOSIS — R609 Edema, unspecified: Secondary | ICD-10-CM

## 2012-12-24 DIAGNOSIS — Z9889 Other specified postprocedural states: Secondary | ICD-10-CM

## 2012-12-24 DIAGNOSIS — T50995A Adverse effect of other drugs, medicaments and biological substances, initial encounter: Secondary | ICD-10-CM

## 2012-12-24 DIAGNOSIS — N184 Chronic kidney disease, stage 4 (severe): Secondary | ICD-10-CM

## 2012-12-24 DIAGNOSIS — Z87891 Personal history of nicotine dependence: Secondary | ICD-10-CM

## 2012-12-24 DIAGNOSIS — M1990 Unspecified osteoarthritis, unspecified site: Secondary | ICD-10-CM

## 2012-12-24 DIAGNOSIS — G459 Transient cerebral ischemic attack, unspecified: Secondary | ICD-10-CM

## 2012-12-24 DIAGNOSIS — J449 Chronic obstructive pulmonary disease, unspecified: Secondary | ICD-10-CM

## 2012-12-24 DIAGNOSIS — M7150 Other bursitis, not elsewhere classified, unspecified site: Secondary | ICD-10-CM

## 2012-12-24 DIAGNOSIS — Z9181 History of falling: Secondary | ICD-10-CM

## 2012-12-24 DIAGNOSIS — N200 Calculus of kidney: Secondary | ICD-10-CM

## 2012-12-24 DIAGNOSIS — Z0000 Encounter for general adult medical examination without abnormal findings: Secondary | ICD-10-CM

## 2012-12-24 DIAGNOSIS — F32A Depression, unspecified: Secondary | ICD-10-CM

## 2012-12-24 DIAGNOSIS — I509 Heart failure, unspecified: Secondary | ICD-10-CM | POA: Diagnosis present

## 2012-12-24 DIAGNOSIS — M722 Plantar fascial fibromatosis: Secondary | ICD-10-CM

## 2012-12-24 DIAGNOSIS — I4892 Unspecified atrial flutter: Secondary | ICD-10-CM

## 2012-12-24 DIAGNOSIS — E876 Hypokalemia: Secondary | ICD-10-CM

## 2012-12-24 DIAGNOSIS — R042 Hemoptysis: Secondary | ICD-10-CM

## 2012-12-24 DIAGNOSIS — IMO0002 Reserved for concepts with insufficient information to code with codable children: Secondary | ICD-10-CM

## 2012-12-24 DIAGNOSIS — E039 Hypothyroidism, unspecified: Secondary | ICD-10-CM

## 2012-12-24 DIAGNOSIS — Z8673 Personal history of transient ischemic attack (TIA), and cerebral infarction without residual deficits: Secondary | ICD-10-CM

## 2012-12-24 DIAGNOSIS — I10 Essential (primary) hypertension: Secondary | ICD-10-CM

## 2012-12-24 DIAGNOSIS — I6522 Occlusion and stenosis of left carotid artery: Secondary | ICD-10-CM

## 2012-12-24 HISTORY — DX: Acute myocardial infarction, unspecified: I21.9

## 2012-12-24 HISTORY — DX: Malignant (primary) neoplasm, unspecified: C80.1

## 2012-12-24 LAB — CBC WITH DIFFERENTIAL/PLATELET
Basophils Absolute: 0 10*3/uL (ref 0.0–0.1)
Basophils Relative: 0 % (ref 0–1)
HCT: 39.2 % (ref 39.0–52.0)
Lymphocytes Relative: 18 % (ref 12–46)
MCHC: 32.4 g/dL (ref 30.0–36.0)
Monocytes Absolute: 0.7 10*3/uL (ref 0.1–1.0)
Neutro Abs: 6.2 10*3/uL (ref 1.7–7.7)
Neutrophils Relative %: 71 % (ref 43–77)
Platelets: 264 10*3/uL (ref 150–400)
RDW: 14.5 % (ref 11.5–15.5)
WBC: 8.7 10*3/uL (ref 4.0–10.5)

## 2012-12-24 LAB — URINALYSIS, ROUTINE W REFLEX MICROSCOPIC
Glucose, UA: NEGATIVE mg/dL
Ketones, ur: NEGATIVE mg/dL
Leukocytes, UA: NEGATIVE
Nitrite: NEGATIVE
Specific Gravity, Urine: 1.019 (ref 1.005–1.030)
pH: 5.5 (ref 5.0–8.0)

## 2012-12-24 LAB — BASIC METABOLIC PANEL
CO2: 26 mEq/L (ref 19–32)
Chloride: 103 mEq/L (ref 96–112)
Creatinine, Ser: 1.62 mg/dL — ABNORMAL HIGH (ref 0.50–1.35)
GFR calc Af Amer: 42 mL/min — ABNORMAL LOW (ref >90)
Potassium: 3.7 mEq/L (ref 3.5–5.1)
Sodium: 139 mEq/L (ref 135–145)

## 2012-12-24 LAB — OCCULT BLOOD, POC DEVICE: Fecal Occult Bld: POSITIVE — AB

## 2012-12-24 LAB — GLUCOSE, CAPILLARY: Glucose-Capillary: 112 mg/dL — ABNORMAL HIGH (ref 70–99)

## 2012-12-24 LAB — LACTIC ACID, PLASMA: Lactic Acid, Venous: 1.1 mmol/L (ref 0.5–2.2)

## 2012-12-24 MED ORDER — ONDANSETRON HCL 4 MG PO TABS: 4.0000 mg | ORAL_TABLET | Freq: Four times a day (QID) | ORAL | Status: DC | PRN

## 2012-12-24 MED ORDER — ENOXAPARIN SODIUM 40 MG/0.4ML ~~LOC~~ SOLN
40.0000 mg | SUBCUTANEOUS | Status: DC
  Administered 2012-12-24 – 2012-12-29 (×6): 40 mg via SUBCUTANEOUS
  Filled 2012-12-24 (×7): qty 0.4

## 2012-12-24 MED ORDER — HYDROCODONE-ACETAMINOPHEN 5-500 MG PO TABS: 1.0000 | ORAL_TABLET | Freq: Four times a day (QID) | ORAL | Status: DC | PRN

## 2012-12-24 MED ORDER — CLOPIDOGREL BISULFATE 75 MG PO TABS
75.0000 mg | ORAL_TABLET | Freq: Every day | ORAL | Status: DC
  Administered 2012-12-24 – 2012-12-30 (×7): 75 mg via ORAL
  Filled 2012-12-24 (×7): qty 1

## 2012-12-24 MED ORDER — SODIUM CHLORIDE 0.9 % IJ SOLN
3.0000 mL | Freq: Two times a day (BID) | INTRAMUSCULAR | Status: DC
  Administered 2012-12-24 – 2012-12-29 (×10): 3 mL via INTRAVENOUS

## 2012-12-24 MED ORDER — VITAMIN D3 25 MCG (1000 UNIT) PO TABS
1000.0000 [IU] | ORAL_TABLET | Freq: Every day | ORAL | Status: DC
  Administered 2012-12-24 – 2012-12-30 (×7): 1000 [IU] via ORAL
  Filled 2012-12-24 (×7): qty 1

## 2012-12-24 MED ORDER — ONDANSETRON HCL 4 MG/2ML IJ SOLN: 4.0000 mg | Freq: Four times a day (QID) | INTRAMUSCULAR | Status: DC | PRN

## 2012-12-24 MED ORDER — DARIFENACIN HYDROBROMIDE ER 7.5 MG PO TB24
7.5000 mg | ORAL_TABLET | Freq: Every day | ORAL | Status: DC
  Administered 2012-12-24 – 2012-12-30 (×7): 7.5 mg via ORAL
  Filled 2012-12-24 (×7): qty 1

## 2012-12-24 MED ORDER — HYDROCODONE-ACETAMINOPHEN 5-325 MG PO TABS: 1.0000 | ORAL_TABLET | Freq: Four times a day (QID) | ORAL | Status: DC | PRN

## 2012-12-24 MED ORDER — DOCUSATE SODIUM 100 MG PO CAPS
100.0000 mg | ORAL_CAPSULE | Freq: Two times a day (BID) | ORAL | Status: DC
  Administered 2012-12-24 – 2012-12-30 (×12): 100 mg via ORAL
  Filled 2012-12-24 (×14): qty 1

## 2012-12-24 MED ORDER — TRAZODONE HCL 100 MG PO TABS
100.0000 mg | ORAL_TABLET | Freq: Every day | ORAL | Status: DC
  Administered 2012-12-24 – 2012-12-29 (×6): 100 mg via ORAL
  Filled 2012-12-24 (×7): qty 1

## 2012-12-24 MED ORDER — ALBUTEROL SULFATE (5 MG/ML) 0.5% IN NEBU
2.5000 mg | INHALATION_SOLUTION | RESPIRATORY_TRACT | Status: DC
  Administered 2012-12-24 – 2012-12-25 (×4): 2.5 mg via RESPIRATORY_TRACT
  Filled 2012-12-24 (×5): qty 0.5

## 2012-12-24 MED ORDER — IPRATROPIUM BROMIDE 0.02 % IN SOLN
0.5000 mg | Freq: Four times a day (QID) | RESPIRATORY_TRACT | Status: DC | PRN
  Administered 2012-12-24: 0.5 mg via RESPIRATORY_TRACT
  Filled 2012-12-24: qty 2.5

## 2012-12-24 MED ORDER — SODIUM CHLORIDE 0.9 % IV SOLN
INTRAVENOUS | Status: DC
  Administered 2012-12-24: 12:00:00 via INTRAVENOUS

## 2012-12-24 MED ORDER — INSULIN ASPART 100 UNIT/ML ~~LOC~~ SOLN
0.0000 [IU] | Freq: Three times a day (TID) | SUBCUTANEOUS | Status: DC
  Administered 2012-12-26: 5 [IU] via SUBCUTANEOUS
  Administered 2012-12-27 – 2012-12-28 (×2): 2 [IU] via SUBCUTANEOUS
  Administered 2012-12-28: 3 [IU] via SUBCUTANEOUS
  Administered 2012-12-29 – 2012-12-30 (×3): 2 [IU] via SUBCUTANEOUS

## 2012-12-24 MED ORDER — GABAPENTIN 300 MG PO CAPS
300.0000 mg | ORAL_CAPSULE | Freq: Every day | ORAL | Status: DC
  Administered 2012-12-24 – 2012-12-29 (×6): 300 mg via ORAL
  Filled 2012-12-24 (×7): qty 1

## 2012-12-24 MED ORDER — SIMVASTATIN 20 MG PO TABS
20.0000 mg | ORAL_TABLET | Freq: Every day | ORAL | Status: DC
  Administered 2012-12-24 – 2012-12-25 (×2): 20 mg via ORAL
  Filled 2012-12-24 (×3): qty 1

## 2012-12-24 MED ORDER — FOLIC ACID 1 MG PO TABS
1.0000 mg | ORAL_TABLET | Freq: Every day | ORAL | Status: DC
  Administered 2012-12-24 – 2012-12-30 (×7): 1 mg via ORAL
  Filled 2012-12-24 (×7): qty 1

## 2012-12-24 MED ORDER — ACETAMINOPHEN 650 MG RE SUPP: 650.0000 mg | Freq: Four times a day (QID) | RECTAL | Status: DC | PRN

## 2012-12-24 MED ORDER — SENNA 8.6 MG PO TABS
1.0000 | ORAL_TABLET | Freq: Two times a day (BID) | ORAL | Status: DC
  Administered 2012-12-24 – 2012-12-30 (×12): 8.6 mg via ORAL
  Filled 2012-12-24 (×13): qty 1

## 2012-12-24 MED ORDER — DEXTROSE 5 % IV SOLN
1.0000 g | Freq: Once | INTRAVENOUS | Status: AC
  Administered 2012-12-24: 1 g via INTRAVENOUS
  Filled 2012-12-24: qty 10

## 2012-12-24 MED ORDER — DEXTROSE 5 % IV SOLN
500.0000 mg | Freq: Once | INTRAVENOUS | Status: AC
  Administered 2012-12-24: 500 mg via INTRAVENOUS
  Filled 2012-12-24: qty 500

## 2012-12-24 MED ORDER — ACETAMINOPHEN 325 MG PO TABS: 650.0000 mg | ORAL_TABLET | Freq: Four times a day (QID) | ORAL | Status: DC | PRN

## 2012-12-24 MED ORDER — ALBUTEROL SULFATE HFA 108 (90 BASE) MCG/ACT IN AERS: 2.0000 | INHALATION_SPRAY | Freq: Four times a day (QID) | RESPIRATORY_TRACT | Status: DC | PRN

## 2012-12-24 MED ORDER — LEVOTHYROXINE SODIUM 137 MCG PO TABS
137.0000 ug | ORAL_TABLET | Freq: Every day | ORAL | Status: DC
  Administered 2012-12-25 – 2012-12-30 (×6): 137 ug via ORAL
  Filled 2012-12-24 (×8): qty 1

## 2012-12-24 NOTE — H&P (Signed)
[facility] History and Physical  [name] ZOX:[id number] DOB: 1923-03-31 DOA: 12/24/2012   PCP: [name], MD   Chief Complaint: Fall and syncope  HPI:  78 year old male with a history of hyperlipidemia, diabetes mellitus type 2, atrial fibrillation, B12 deficiency, and coronary artery disease presents with syncope. The patient was getting up out of bed this morning and try to put on his pants when he fell to the ground and hit the back of his head. After this event, his family called EMS. When EMS arrived, they tried to stand the patient up. The patient subsequently had a syncopal episode. He was found to have a heart rate of 160. The patient was then given Cardizem IV x2 pushes with improvement of his heart rate. He was brought to the emergency department for further evaluation.  The patient has been complaining of increasing fatigue, shortness of breath, Lightheadedness, and chest discomfort for the past 2-3 weeks. He also states that he has had increasing abdominal girth and some worsening of his lower extremity edema. He has not had any frank PND or orthopnea. He denies any fevers, chills, coughing, hemoptysis, nausea, vomiting, diarrhea, abdominal pain, dysuria. The patient has been constipated, and has had to use an enema at home. In emergency department, the patient was found to be hypotensive with the systolic blood pressure of 77. I requested critical care to see the patient. By the time critical care evaluated the patient, the patient's blood pressure had improved with fluid resuscitation. In addition, the patient has spontaneously converted back to sinus rhythm. EKG in emergency department did reveal atrial fibrillation with a heart rate of 87. Chest x-ray revealed chronic left lower lobe infiltrate, increasing right effusion. Assessment/Plan: Acute diastolic CHF -Echocardiogram on December 20 13,013 revealed ejection fraction 50-55%, mild to moderate aortic stenosis with  peak gradient of 27 mmHg -will give any more intravenous fluids -Since the patient has low blood pressures, will have to be judicious about diuresis at this time -Cardiology, Dr. Antoine Poche, has been consulted Hypotension -This is likely a result of the Cardizem that the patient was given by EMS -Doubt sepsis at this time -Discontinue antibiotics -Hold his home antihypertensive medications Atrial fibrillation/aortic stenosis -Will defer the need for another echocardiogram to cardiology -Check TSH -Patient is rate controlled at this time, back in sinus Syncope -This may been a result of the patient's atrial fibrillation and possibly his aortic stenosis -It is unclear whether this occurred before or after the patient was given intravenous Cardizem Hyperlipidemia -Continue statin CKD stage III -Baseline creatinine 1.5-1.8 Diabetes mellitus type 2 -NovoLog sliding scale -He does not appear to be in on any therapy at home -Check hemoglobin A1c Coronary artery disease with a history of NSTEMI -Continue Plavix       Past Medical History  Diagnosis Date  . CAD (coronary artery disease)     s/p NSTEMI and BMS stent diagonal07/09;  Lexiscan Myoview 9/13:  EF 62%, no ischemia  . AS (aortic stenosis)     a.  Echo 2009 mean gradient 9mm HG. AVA 2.18;  b.  Echo 4/11 mild AS mean gradient 10mm HG;  c.  Echo 04/2011: Mild LVH, EF 55-60%, grade 1 diastolic dysfunction, mild aortic stenosis, mean gradient 14, mild LAE;  d. Echo 9/13: mod LVH, EF 50-55%, mild to mod AS, mean gradient 17 mmHg  . AF (paroxysmal atrial fibrillation)     Holter monitor 2.5 sec pauses  . Fatigue     CPX 11/09  VO2  14.4 (75% predicte)d, slope 35.  O2 pulse normal.  VO2 corrected for body weight 17.6.  Marland Kitchen Hypothyroidism   . Hyperlipidemia   . History of prostate cancer   . Allergic rhinitis   . DM (diabetes mellitus)     type II diet controlled  . Shingles   . Left carotid stenosis   . Vitamin B 12 deficiency     . Anemia   . Osteoarthritis   . Nephrolithiasis   . Obesity   . OSA (obstructive sleep apnea)     AHI 15 PSG 09/06/08  . Hx of colonoscopy   . Stroke   . Anxiety and depression   . PMR (polymyalgia rheumatica) 05/29/2012  . Hypogonadism male 05/29/2012  . Hyperparathyroidism 05/29/2012  . TIA (transient ischemic attack) 05/29/2012  . CKD (chronic kidney disease) stage 4, GFR 15-29 ml/min 05/29/2012  . COPD (chronic obstructive pulmonary disease) 05/29/2012   Past Surgical History  Procedure Date  . Appendectomy   . Hernia repair   . Right carpal tunnel release   . Lumbar spine surgery   . Cystectomy     neck  . Prostatectomy   . Vasectomy   . Total knee arthroplasty     right  . Cholecystectomy    Social History:  reports that he quit smoking about 54 years ago. His smoking use included Cigarettes. He has a 12.5 pack-year smoking history. He has quit using smokeless tobacco. He reports that he does not drink alcohol or use illicit drugs.   Family History  Problem Relation Age of Onset  . Stroke Mother 71  . Coronary artery disease      father, brother, sister  . Alcohol abuse Father 60     Allergies  Allergen Reactions  . Bactrim (Sulfamethoxazole W-Trimethoprim) Other (See Comments)    hallucinations  . Horse-Derived Products Hives      Prior to Admission medications   Medication Sig Start Date End Date Taking? Authorizing Provider  albuterol (PROVENTIL HFA;VENTOLIN HFA) 108 (90 BASE) MCG/ACT inhaler Inhale 2 puffs into the lungs every 6 (six) hours as needed for wheezing or shortness of breath. 11/29/12  Yes [name], MD  Ascorbic Acid (VITAMIN C) 500 MG tablet Take 500 mg by mouth daily.     Yes Historical Provider, MD  cholecalciferol (VITAMIN D) 1000 UNITS tablet Take 1,000 Units by mouth daily.   Yes Historical Provider, MD  clopidogrel (PLAVIX) 75 MG tablet Take 1 tablet (75 mg total) by mouth daily. 09/12/12  Yes [name], MD  Cyanocobalamin (VITAMIN B-12  IJ) Inject 1,000 mcg into the muscle every 14 (fourteen) days. Every 2 weeks   Yes Historical Provider, MD  cyclobenzaprine (FLEXERIL) 10 MG tablet Take 5-10 mg by mouth 3 (three) times daily as needed. Muscle spasms.   Yes Historical Provider, MD  folic acid (FOLVITE) 1 MG tablet Take 1 mg by mouth daily.   Yes Historical Provider, MD  furosemide (LASIX) 20 MG tablet Take 1 tablet (20 mg total) by mouth 2 (two) times daily. 12/17/12  Yes Nicki Reaper, NP  gabapentin (NEURONTIN) 300 MG capsule Take 1 capsule (300 mg total) by mouth at bedtime. 06/25/12  Yes [name], MD  hydrALAZINE (APRESOLINE) 25 MG tablet Take 12.5 mg by mouth 2 (two) times daily.   Yes Historical Provider, MD  HYDROcodone-acetaminophen (VICODIN) 5-500 MG per tablet Take 1 tablet by mouth every 6 (six) hours as needed. Pain   Yes Historical Provider,  MD  hydrOXYzine (ATARAX/VISTARIL) 10 MG tablet Take 10 mg by mouth 3 (three) times daily as needed. nerves 06/25/12  Yes [name], MD  hyoscyamine (LEVSIN, ANASPAZ) 0.125 MG tablet Take 0.125 mg by mouth 4 (four) times daily as needed. Gas pain.   Yes Historical Provider, MD  levothyroxine (SYNTHROID, LEVOTHROID) 137 MCG tablet Take 1 tablet (137 mcg total) by mouth daily. 09/12/12  Yes [name], MD  methotrexate (RHEUMATREX) 2.5 MG tablet Take 10 mg by mouth once a week. Takes 4 tablets weekly on  Sunday.   Caution:Chemotherapy. Protect from light.   Yes Historical Provider, MD  pravastatin (PRAVACHOL) 40 MG tablet Take 1 tablet (40 mg total) by mouth daily. 06/25/12  Yes [name], MD  solifenacin (VESICARE) 5 MG tablet Take 5 mg by mouth daily.     Yes Historical Provider, MD  traMADol (ULTRAM) 50 MG tablet Take 50-100 mg by mouth 3 (three) times daily as needed. Pain.   Yes Historical Provider, MD  traZODone (DESYREL) 100 MG tablet Take 100 mg by mouth at bedtime.   Yes Historical Provider, MD    Review of Systems:  Constitutional:  No weight loss, night sweats, Fevers,  chills, fatigue.  Head&Eyes: No headache.  No vision loss.  No eye pain or scotoma ENT:  No Difficulty swallowing,Tooth/dental problems,Sore throat,  No ear ache, post nasal drip,  Cardio-vascular:  No chest pain, Orthopnea, PND, swelling in lower extremities GI:  No  abdominal pain, nausea, vomiting, diarrhea, hematochezia, melena, heartburn, indigestion, Resp:  No shortness of breath with exertion or at rest. No cough. No coughing up of blood .No wheezing.No chest wall deformity  Skin:  no rash or lesions.  GU:  no dysuria, change in color of urine, no urgency or frequency. No flank pain.  Musculoskeletal:  No joint pain or swelling. No decreased range of motion. No back pain.  Psych:  No change in mood or affect. No depression or anxiety. Neurologic: No headache, no dysesthesia, no focal weakness, no vision loss. Physical Exam: Filed Vitals:   12/24/12 1445 12/24/12 1500 12/24/12 1515 12/24/12 1530  BP: 98/60 100/56 96/58 108/63  Pulse: 79 79 80 77  Temp:      TempSrc:      Resp: 16 18 15 20   SpO2: 99% 100% 100% 99%   General:  A&O x 3, NAD, nontoxic, pleasant/cooperative Head/Eye: No conjunctival hemorrhage, no icterus, [place]/AT, No nystagmus ENT:  No icterus,  No thrush, no pharyngeal exudate Neck:  No masses, no lymphadenpathy, no bruits CV:  RRR, no rub, no S3 Lung:  CTAB, good air movement, no wheeze, no rhonchi Abdomen: soft/NT, +BS, nondistended, no peritoneal signs Ext: No cyanosis, No rashes, No petechiae, No lymphangitis, 1+ edema   Labs on Admission:  Basic Metabolic Panel:  Lab 12/24/12 1610  NA 139  K 3.7  CL 103  CO2 26  GLUCOSE 109*  BUN 15  CREATININE 1.62*  CALCIUM 8.0*  MG --  PHOS --   Liver Function Tests: No results found for this basename: AST:5,ALT:5,ALKPHOS:5,BILITOT:5,PROT:5,ALBUMIN:5 in the last 168 hours No results found for this basename: LIPASE:5,AMYLASE:5 in the last 168 hours No results found for this basename: AMMONIA:5 in  the last 168 hours CBC:  Lab 12/24/12 1131  WBC 8.7  NEUTROABS 6.2  HGB 12.7*  HCT 39.2  MCV 103.4*  PLT 264   Cardiac Enzymes:  Lab 12/24/12 1131  CKTOTAL --  CKMB --  CKMBINDEX --  TROPONINI <  0.30   BNP: No components found with this basename: POCBNP:5 CBG: No results found for this basename: GLUCAP:5 in the last 168 hours  Radiological Exams on Admission: Ct Head Wo Contrast  12/24/2012  *RADIOLOGY REPORT*  Clinical Data:  Fall. Head and neck injury with loss of consciousness.  CT HEAD WITHOUT CONTRAST CT CERVICAL SPINE WITHOUT CONTRAST  Technique:  Multidetector CT imaging of the head and cervical spine was performed following the standard protocol without intravenous contrast.  Multiplanar CT image reconstructions of the cervical spine were also generated.  Comparison:  Head CT on 11/18/2012  CT HEAD  Findings: There is no evidence of intracranial hemorrhage, brain edema or other signs of acute infarction.  There is no evidence of intracranial mass lesion or mass effect.  No abnormal extra-axial fluid collections are identified.  Moderate cerebral atrophy is stable.  Ventricles are stable in size.  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  No skull fracture or other significant bone abnormality identified.  IMPRESSION:  1.  No acute intracranial findings. 2.  Stable cerebral atrophy and old right basal ganglia lacune.  CT CERVICAL SPINE  Findings: No evidence of acute cervical spine fracture.  Severe degenerative disc disease is seen at levels of C5-6, C6-7, and C7- T1.  Severe facet DJD and ankylosis seen bilaterally at C3-4 and C4- 5.  Facet DJD also seen at other cervical levels, and atlantoaxial degenerative changes are also noted.  Grade 1 anterolisthesis noted at C4-5 measuring approximately 3 mm, which is attributable to the facet DJD seen at this level.  IMPRESSION:  1.  No evidence of acute cervical spine fracture. 2.  Advanced degenerative  spondylosis, with grade 1 anterolisthesis at C4-5.   Original Report Authenticated By: Myles Rosenthal, M.D.    Ct Cervical Spine Wo Contrast  12/24/2012  *RADIOLOGY REPORT*  Clinical Data:  Fall. Head and neck injury with loss of consciousness.  CT HEAD WITHOUT CONTRAST CT CERVICAL SPINE WITHOUT CONTRAST  Technique:  Multidetector CT imaging of the head and cervical spine was performed following the standard protocol without intravenous contrast.  Multiplanar CT image reconstructions of the cervical spine were also generated.  Comparison:  Head CT on 11/18/2012  CT HEAD  Findings: There is no evidence of intracranial hemorrhage, brain edema or other signs of acute infarction.  There is no evidence of intracranial mass lesion or mass effect.  No abnormal extra-axial fluid collections are identified.  Moderate cerebral atrophy is stable.  Ventricles are stable in size.  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  No skull fracture or other significant bone abnormality identified.  IMPRESSION:  1.  No acute intracranial findings. 2.  Stable cerebral atrophy and old right basal ganglia lacune.  CT CERVICAL SPINE  Findings: No evidence of acute cervical spine fracture.  Severe degenerative disc disease is seen at levels of C5-6, C6-7, and C7- T1.  Severe facet DJD and ankylosis seen bilaterally at C3-4 and C4- 5.  Facet DJD also seen at other cervical levels, and atlantoaxial degenerative changes are also noted.  Grade 1 anterolisthesis noted at C4-5 measuring approximately 3 mm, which is attributable to the facet DJD seen at this level.  IMPRESSION:  1.  No evidence of acute cervical spine fracture. 2.  Advanced degenerative spondylosis, with grade 1 anterolisthesis at C4-5.   Original Report Authenticated By: Myles Rosenthal, M.D.    Dg Chest Port 1 View  12/24/2012  *RADIOLOGY  REPORT*  Clinical Data: Larey Seat.  Weakness.  PORTABLE CHEST - 1 VIEW  Comparison: 12/05/2012.  Findings: The cardiac  silhouette, mediastinal and hilar contours are stable.  There is an enlarging right pleural effusion with overlying atelectasis or infiltrate.  Persistent left basilar atelectasis or infiltrate.  No definite pneumothorax.  IMPRESSION: Worsening lung aeration with increasing right pleural effusion and increasing bibasilar atelectasis or infiltrates.   Original Report Authenticated By: Rudie Meyer, M.D.     EKG: Independently reviewed. Atrial fibrillation, heart rate 87, no acute ST-T wave changes    Time spent:70 minutes Code Status:   Full Family Communication:   Family at bedside   Jeanmarie Mccowen, DO  [facility] Pager [phone]  If 7PM-7AM, please contact night-coverage [email or web address] Password [email or web address] 12/24/2012, 3:47 PM

## 2012-12-24 NOTE — ED Notes (Signed)
Admitting MD at bedside.

## 2012-12-24 NOTE — ED Notes (Signed)
Pt. Larey Seat when he got dressed. Initial sbp108, when he stood up, pt. Then collapsed - out for 45 secs. rapid afib (100 - 160). ems gave x 2 10 mg of cardizem - 110/64, and hr is 70's to 80's. Denies cp. Weak x 2 years. Did have a syncopal episode prior to ems. Unwitnessed fall, called caregiver after he fell. At [facility] around Thanksgiving for [employer] [facility]; Negative for stroke scale. Pearl 1 mm. Did not take pain medication today. Pt. Is A&O x4; last dose of cardizem g

## 2012-12-24 NOTE — ED Notes (Signed)
Pt. Placed in trendelenburg after initial bp

## 2012-12-24 NOTE — ED Notes (Signed)
Pt. Lethargic since last fall, mouth is dry, very weak; not eating well.

## 2012-12-24 NOTE — Telephone Encounter (Signed)
NURSE WITH [facility] CALLED TO REQUEST TO EXTEND PHYSICAL THERAPY VISIT WITH PATIENT. DUE TO MISSED VISITS AND GOALS NOT MET.  REQUEST TO EXTEND VISIT 2 X WEEK FOR 2 MORE WEEKS.Marland Kitchen PLEASE ADVISE. CB #[phone]

## 2012-12-24 NOTE — ED Notes (Signed)
Dr. Effie Shy aware of triage assessment and low bp

## 2012-12-24 NOTE — ED Provider Notes (Signed)
History     CSN: [id number]  Arrival date & time 12/24/12  1004   First MD Initiated Contact with Patient 12/24/12 1039      Chief Complaint  Patient presents with  . Near Syncope    (Consider location/radiation/quality/duration/timing/severity/associated sxs/prior treatment) HPI Comments: [name] is a 78 y.o. Male who was at home this morning, stood up, and fell. He injured his head in the fall. A family member called EMS; when they arrived; they stood him up, whereupon, he passed out. It was noted that he had heart rate of 160. History of with boluses of Cardizem x2 with improvement of heart rate, but lowering the blood pressure. His loss of consciousness, was about 30 seconds. He has been lucid since. He has had decreased appetite for 3 weeks. No vomiting. He has had decreased stooling, and has used an enema, without relief of the constipation. No fever, cough, chills. Has a chronic wound in his right lower leg. He was recently treated with oral Keflex; he continues to have a dressing on the wound. He has home health services for medical assessment. There are no other modifying factors,  The history is provided by the patient.    Past Medical History  Diagnosis Date  . CAD (coronary artery disease)     s/p NSTEMI and BMS stent diagonal07/09;  Lexiscan Myoview 9/13:  EF 62%, no ischemia  . AS (aortic stenosis)     a.  Echo 2009 mean gradient 9mm HG. AVA 2.18;  b.  Echo 4/11 mild AS mean gradient 10mm HG;  c.  Echo 04/2011: Mild LVH, EF 55-60%, grade 1 diastolic dysfunction, mild aortic stenosis, mean gradient 14, mild LAE;  d. Echo 9/13: mod LVH, EF 50-55%, mild to mod AS, mean gradient 17 mmHg  . AF (paroxysmal atrial fibrillation)     Holter monitor 2.5 sec pauses  . Fatigue     CPX 11/09 VO2  14.4 (75% predicte)d, slope 35.  O2 pulse normal.  VO2 corrected for body weight 17.6.  Marland Kitchen Hypothyroidism   . Hyperlipidemia   . History of prostate cancer   . Allergic rhinitis   .  DM (diabetes mellitus)     type II diet controlled  . Shingles   . Left carotid stenosis   . Vitamin B 12 deficiency   . Anemia   . Osteoarthritis   . Nephrolithiasis   . Obesity   . OSA (obstructive sleep apnea)     AHI 15 PSG 09/06/08  . Hx of colonoscopy   . Stroke   . Anxiety and depression   . PMR (polymyalgia rheumatica) 05/29/2012  . Hypogonadism male 05/29/2012  . Hyperparathyroidism 05/29/2012  . TIA (transient ischemic attack) 05/29/2012  . CKD (chronic kidney disease) stage 4, GFR 15-29 ml/min 05/29/2012  . COPD (chronic obstructive pulmonary disease) 05/29/2012    Past Surgical History  Procedure Date  . Appendectomy   . Hernia repair   . Right carpal tunnel release   . Lumbar spine surgery   . Cystectomy     neck  . Prostatectomy   . Vasectomy   . Total knee arthroplasty     right  . Cholecystectomy     Family History  Problem Relation Age of Onset  . Stroke Mother 32  . Coronary artery disease      father, brother, sister  . Alcohol abuse Father 18    History  Substance Use Topics  . Smoking status: Former Smoker -- 0.5  packs/day for 25 years    Types: Cigarettes    Quit date: 12/25/1958  . Smokeless tobacco: Former Neurosurgeon     Comment: quit in 1950s  . Alcohol Use: No      Review of Systems  All other systems reviewed and are negative.    Allergies  Bactrim and Horse-derived products  Home Medications   Current Outpatient Rx  Name  Route  Sig  Dispense  Refill  . ALBUTEROL SULFATE HFA 108 (90 BASE) MCG/ACT IN AERS   Inhalation   Inhale 2 puffs into the lungs every 6 (six) hours as needed for wheezing or shortness of breath.   1 Inhaler   5   . VITAMIN C 500 MG PO TABS   Oral   Take 500 mg by mouth daily.           Marland Kitchen VITAMIN D 1000 UNITS PO TABS   Oral   Take 1,000 Units by mouth daily.         Marland Kitchen CLOPIDOGREL BISULFATE 75 MG PO TABS   Oral   Take 1 tablet (75 mg total) by mouth daily.   90 tablet   3   . VITAMIN B-12 IJ    Intramuscular   Inject 1,000 mcg into the muscle every 14 (fourteen) days. Every 2 weeks         . CYCLOBENZAPRINE HCL 10 MG PO TABS   Oral   Take 5-10 mg by mouth 3 (three) times daily as needed. Muscle spasms.         . FOLIC ACID 1 MG PO TABS   Oral   Take 1 mg by mouth daily.         . FUROSEMIDE 20 MG PO TABS   Oral   Take 1 tablet (20 mg total) by mouth 2 (two) times daily.   30 tablet   3   . GABAPENTIN 300 MG PO CAPS   Oral   Take 1 capsule (300 mg total) by mouth at bedtime.   30 capsule   5   . HYDRALAZINE HCL 25 MG PO TABS   Oral   Take 12.5 mg by mouth 2 (two) times daily.         Marland Kitchen HYDROCODONE-ACETAMINOPHEN 5-500 MG PO TABS   Oral   Take 1 tablet by mouth every 6 (six) hours as needed. Pain         . HYDROXYZINE HCL 10 MG PO TABS   Oral   Take 10 mg by mouth 3 (three) times daily as needed. nerves         . HYOSCYAMINE SULFATE 0.125 MG PO TABS   Oral   Take 0.125 mg by mouth 4 (four) times daily as needed. Gas pain.         Marland Kitchen LEVOTHYROXINE SODIUM 137 MCG PO TABS   Oral   Take 1 tablet (137 mcg total) by mouth daily.   90 tablet   3   . METHOTREXATE 2.5 MG PO TABS   Oral   Take 10 mg by mouth once a week. Takes 4 tablets weekly on  Sunday.   Caution:Chemotherapy. Protect from light.         Marland Kitchen PRAVASTATIN SODIUM 40 MG PO TABS   Oral   Take 1 tablet (40 mg total) by mouth daily.   30 tablet   5   . SOLIFENACIN SUCCINATE 5 MG PO TABS   Oral   Take 5 mg by mouth daily.           Marland Kitchen  TRAMADOL HCL 50 MG PO TABS   Oral   Take 50-100 mg by mouth 3 (three) times daily as needed. Pain.         . TRAZODONE HCL 100 MG PO TABS   Oral   Take 100 mg by mouth at bedtime.           BP 100/56  Pulse 79  Temp 98.1 F (36.7 C) (Oral)  Resp 18  SpO2 100%  Physical Exam  Nursing note and vitals reviewed. Constitutional: He is oriented to person, place, and time. He appears well-developed and well-nourished.  HENT:  Head:  Normocephalic and atraumatic.  Right Ear: External ear normal.  Left Ear: External ear normal.  Eyes: Conjunctivae normal and EOM are normal. Pupils are equal, round, and reactive to light.  Neck: Normal range of motion and phonation normal. Neck supple.  Cardiovascular: Normal rate, regular rhythm, normal heart sounds and intact distal pulses.   Pulmonary/Chest: Effort normal and breath sounds normal. He exhibits no bony tenderness.  Abdominal: Soft. Normal appearance. There is no tenderness.  Genitourinary:       Rectal exam- normal. Anal sphincter tone, internal hemorrhoid present, stool, normal color.  Musculoskeletal: Normal range of motion.  Neurological: He is alert and oriented to person, place, and time. He has normal strength. No cranial nerve deficit or sensory deficit. He exhibits normal muscle tone. Coordination normal.  Skin: Skin is warm, dry and intact.  Psychiatric: He has a normal mood and affect. His behavior is normal. Judgment and thought content normal.    ED Course  Procedures (including critical care time)  Patient was hypotensive on arrival. He was treated with IV fluid boluses, x2. Total 1 L  Reeval: 12:30- he continues to be hypotensive, after 1 liter NS, BP 88 systolic. Will continue IVF   Treatment for community acquired pneumonia sarted-IV, Rocephin and Zithromax  Reeval/: 14:10- BO 117/66. Pt remains comfortable.   Date: 10/11/2012  Rate: 87  Rhythm: atrial fibrillation  QRS Axis: normal  PR and QT Intervals: borderline prolonged QT  ST/T Wave abnormalities: normal  PR and QRS Conduction Disutrbances:none  Narrative Interpretation:   Old EKG Reviewed: changes noted- AF has recurred    CRITICAL CARE Performed by: Mancel Bale L   Total critical care time: 40 min Critical care time was exclusive of separately billable procedures and treating other patients.  Critical care was necessary to treat or prevent imminent or life-threatening  deterioration.  Critical care was time spent personally by me on the following activities: development of treatment plan with patient and/or surrogate as well as nursing, discussions with consultants, evaluation of patient's response to treatment, examination of patient, obtaining history from patient or surrogate, ordering and performing treatments and interventions, ordering and review of laboratory studies, ordering and review of radiographic studies, pulse oximetry and re-evaluation of patient's condition.   Labs Reviewed  CBC WITH DIFFERENTIAL - Abnormal; Notable for the following:    RBC 3.79 (*)     Hemoglobin 12.7 (*)     MCV 103.4 (*)     All other components within normal limits  BASIC METABOLIC PANEL - Abnormal; Notable for the following:    Glucose, Bld 109 (*)     Creatinine, Ser 1.62 (*)     Calcium 8.0 (*)     GFR calc non Af Amer 37 (*)     GFR calc Af Amer 42 (*)     All other components within normal limits  PRO B  NATRIURETIC PEPTIDE - Abnormal; Notable for the following:    Pro B Natriuretic peptide (BNP) 2861.0 (*)     All other components within normal limits  OCCULT BLOOD, POC DEVICE - Abnormal; Notable for the following:    Fecal Occult Bld POSITIVE (*)     All other components within normal limits  URINALYSIS, ROUTINE W REFLEX MICROSCOPIC  LACTIC ACID, PLASMA  TROPONIN I  URINE CULTURE  CULTURE, BLOOD (ROUTINE X 2)  PRO B NATRIURETIC PEPTIDE   Ct Head Wo Contrast  12/24/2012  *RADIOLOGY REPORT*  Clinical Data:  Fall. Head and neck injury with loss of consciousness.  CT HEAD WITHOUT CONTRAST CT CERVICAL SPINE WITHOUT CONTRAST  Technique:  Multidetector CT imaging of the head and cervical spine was performed following the standard protocol without intravenous contrast.  Multiplanar CT image reconstructions of the cervical spine were also generated.  Comparison:  Head CT on 11/18/2012  CT HEAD  Findings: There is no evidence of intracranial hemorrhage, brain  edema or other signs of acute infarction.  There is no evidence of intracranial mass lesion or mass effect.  No abnormal extra-axial fluid collections are identified.  Moderate cerebral atrophy is stable.  Ventricles are stable in size.  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  No skull fracture or other significant bone abnormality identified.  IMPRESSION:  1.  No acute intracranial findings. 2.  Stable cerebral atrophy and old right basal ganglia lacune.  CT CERVICAL SPINE  Findings: No evidence of acute cervical spine fracture.  Severe degenerative disc disease is seen at levels of C5-6, C6-7, and C7- T1.  Severe facet DJD and ankylosis seen bilaterally at C3-4 and C4- 5.  Facet DJD also seen at other cervical levels, and atlantoaxial degenerative changes are also noted.  Grade 1 anterolisthesis noted at C4-5 measuring approximately 3 mm, which is attributable to the facet DJD seen at this level.  IMPRESSION:  1.  No evidence of acute cervical spine fracture. 2.  Advanced degenerative spondylosis, with grade 1 anterolisthesis at C4-5.   Original Report Authenticated By: Myles Rosenthal, M.D.    Ct Cervical Spine Wo Contrast  12/24/2012  *RADIOLOGY REPORT*  Clinical Data:  Fall. Head and neck injury with loss of consciousness.  CT HEAD WITHOUT CONTRAST CT CERVICAL SPINE WITHOUT CONTRAST  Technique:  Multidetector CT imaging of the head and cervical spine was performed following the standard protocol without intravenous contrast.  Multiplanar CT image reconstructions of the cervical spine were also generated.  Comparison:  Head CT on 11/18/2012  CT HEAD  Findings: There is no evidence of intracranial hemorrhage, brain edema or other signs of acute infarction.  There is no evidence of intracranial mass lesion or mass effect.  No abnormal extra-axial fluid collections are identified.  Moderate cerebral atrophy is stable.  Ventricles are stable in size.  Old lacunar infarct involving  the right left lentiform nucleus is stable.  No skull fracture or other significant bone abnormality identified.  IMPRESSION:  1.  No acute intracranial findings. 2.  Stable cerebral atrophy and old right basal ganglia lacune.  CT CERVICAL SPINE  Findings: No evidence of acute cervical spine fracture.  Severe degenerative disc disease is seen at levels of C5-6, C6-7, and C7- T1.  Severe facet DJD and ankylosis seen bilaterally at C3-4 and C4- 5.  Facet DJD also seen at other cervical levels, and atlantoaxial degenerative changes are also noted.  Grade 1 anterolisthesis noted at C4-5 measuring  approximately 3 mm, which is attributable to the facet DJD seen at this level.  IMPRESSION:  1.  No evidence of acute cervical spine fracture. 2.  Advanced degenerative spondylosis, with grade 1 anterolisthesis at C4-5.   Original Report Authenticated By: Myles Rosenthal, M.D.    Dg Chest Port 1 View  12/24/2012  *RADIOLOGY REPORT*  Clinical Data: Larey Seat.  Weakness.  PORTABLE CHEST - 1 VIEW  Comparison: 12/05/2012.  Findings: The cardiac silhouette, mediastinal and hilar contours are stable.  There is an enlarging right pleural effusion with overlying atelectasis or infiltrate.  Persistent left basilar atelectasis or infiltrate.  No definite pneumothorax.  IMPRESSION: Worsening lung aeration with increasing right pleural effusion and increasing bibasilar atelectasis or infiltrates.   Original Report Authenticated By: Rudie Meyer, M.D.      1. Community acquired pneumonia   2. Fall   3. Syncope   4. Atrial fibrillation   5. Anorexia       MDM  Weakness, and tachycardia leading to syncope. Suspect primarily nutritional deficit, as cause. Incidental finding of infiltrates on chest x-ray. Urinalysis is normal. BNP is elevated.        Flint Melter, MD 12/24/12 [phone]

## 2012-12-24 NOTE — Consult Note (Signed)
CARDIOLOGY CONSULT NOTE  Patient ID: [name] MRN: [id number] DOB/AGE: 04-30-1923 78 y.o.  Admit date: 12/24/2012 Primary Physician [name], MD Primary Cardiologist Dr. Antoine Poche Chief Complaint  Syncope  HPI:  He has a history of CAD mild aortic stenosis, HTN, DM2, paroxysmal atrial fibrillation, chronic kidney disease. He has had a history of sick sinus syndrome in the past. He is not on Coumadin secondary to a history bleeding problems (epistaxis requiring hospitalization). He's also had acute renal failure and hyperkalemia in the setting of diuretic use and diclofenac. CPX testing in 11/09 was overall notable for preserved functional capacity. Patient has a history of TIA in 1/12. Carotid Dopplers demonstrated 0-39% stenosis bilaterally. He had been kept on Plavix.  LHC 7/09 demonstrated: Proximal LAD 25%, mid LAD 25%, D1 subtotally occluded proximally, proximal AV circumflex 25%, then 40%, ostial OM1 25%, ostial OM2 40%, proximal/mid RCA 25%, EF 50%. PCI: BMS to the diagonal of the LAD.  Echo 04/2011: Mild LVH, EF 55-60%, grade 1 diastolic dysfunction, mild aortic stenosis, mean gradient 14, mild LAE.  He was last seen in Sept of this year with increased fatigue and declining functional  Capacity.  He had a negative nuclear stress test in Sept of this year with an EF of 62%.   The patient presents today after a fall.  This occurred after he stood up.  After EMS arrived he stood up and had a frank syncopal episode.  He might have been out for 25 seconds or so. He denies any recent chest discomfort, neck or arm discomfort. He has had dyspnea with any exertion but no PND or orthopnea. He was in atrial fibrillation but this converted actually to sinus rhythm during my examination. He has not noticed his palpitations. He has had progressive failure to thrive. He was hospitalized in November with cellulitis. He had another hospitalization following this with bleeding from an aphthous ulcer. He  has had failure to thrive since that time. He did have lower extremity swelling following those hospitalizations and was sent home on a higher dose of diuretic. When the patient arrived in the emergency room he was hypotensive. In the ER head CT was negative.  CXR demonstrated some edema and increasing effusion.  Pro BNP was mildly elevated.  He was hypotensive and is being treated for a probably community acquired pneumonia.  He is noted to be in atrial fibrillation although the heart rate is not elevated.     Past Medical History  Diagnosis Date  . CAD (coronary artery disease)     s/p NSTEMI and BMS stent diagonal07/09;  Lexiscan Myoview 9/13:  EF 62%, no ischemia  . AS (aortic stenosis)     a.  Echo 2009 mean gradient 9mm HG. AVA 2.18;  b.  Echo 4/11 mild AS mean gradient 10mm HG;  c.  Echo 04/2011: Mild LVH, EF 55-60%, grade 1 diastolic dysfunction, mild aortic stenosis, mean gradient 14, mild LAE;  d. Echo 9/13: mod LVH, EF 50-55%, mild to mod AS, mean gradient 17 mmHg  . AF (paroxysmal atrial fibrillation)     Holter monitor 2.5 sec pauses  . Fatigue     CPX 11/09 VO2  14.4 (75% predicte)d, slope 35.  O2 pulse normal.  VO2 corrected for body weight 17.6.  Marland Kitchen Hypothyroidism   . Hyperlipidemia   . History of prostate cancer   . Allergic rhinitis   . DM (diabetes mellitus)     type II diet controlled  . Shingles   .  Left carotid stenosis   . Vitamin B 12 deficiency   . Anemia   . Osteoarthritis   . Nephrolithiasis   . Obesity   . OSA (obstructive sleep apnea)     AHI 15 PSG 09/06/08  . Hx of colonoscopy   . Stroke   . Anxiety and depression   . PMR (polymyalgia rheumatica) 05/29/2012  . Hypogonadism male 05/29/2012  . Hyperparathyroidism 05/29/2012  . TIA (transient ischemic attack) 05/29/2012  . CKD (chronic kidney disease) stage 4, GFR 15-29 ml/min 05/29/2012  . COPD (chronic obstructive pulmonary disease) 05/29/2012  . Myocardial infarction   . Cancer     prostate ca history      Past Surgical History  Procedure Date  . Appendectomy   . Hernia repair   . Right carpal tunnel release   . Lumbar spine surgery   . Cystectomy     neck  . Prostatectomy   . Vasectomy   . Total knee arthroplasty     right  . Cholecystectomy     Allergies  Allergen Reactions  . Bactrim (Sulfamethoxazole W-Trimethoprim) Other (See Comments)    hallucinations  . Horse-Derived Products Hives   Prior to Admission medications   Medication Sig Start Date End Date Taking? Authorizing Provider  albuterol (PROVENTIL HFA;VENTOLIN HFA) 108 (90 BASE) MCG/ACT inhaler Inhale 2 puffs into the lungs every 6 (six) hours as needed for wheezing or shortness of breath. 11/29/12  Yes [name], MD  Ascorbic Acid (VITAMIN C) 500 MG tablet Take 500 mg by mouth daily.     Yes Historical Provider, MD  cholecalciferol (VITAMIN D) 1000 UNITS tablet Take 1,000 Units by mouth daily.   Yes Historical Provider, MD  clopidogrel (PLAVIX) 75 MG tablet Take 1 tablet (75 mg total) by mouth daily. 09/12/12  Yes [name], MD  Cyanocobalamin (VITAMIN B-12 IJ) Inject 1,000 mcg into the muscle every 14 (fourteen) days. Every 2 weeks   Yes Historical Provider, MD  cyclobenzaprine (FLEXERIL) 10 MG tablet Take 5-10 mg by mouth 3 (three) times daily as needed. Muscle spasms.   Yes Historical Provider, MD  folic acid (FOLVITE) 1 MG tablet Take 1 mg by mouth daily.   Yes Historical Provider, MD  furosemide (LASIX) 20 MG tablet Take 1 tablet (20 mg total) by mouth 2 (two) times daily. 12/17/12  Yes Nicki Reaper, NP  gabapentin (NEURONTIN) 300 MG capsule Take 1 capsule (300 mg total) by mouth at bedtime. 06/25/12  Yes [name], MD  hydrALAZINE (APRESOLINE) 25 MG tablet Take 12.5 mg by mouth 2 (two) times daily.   Yes Historical Provider, MD  HYDROcodone-acetaminophen (VICODIN) 5-500 MG per tablet Take 1 tablet by mouth every 6 (six) hours as needed. Pain   Yes Historical Provider, MD  hydrOXYzine (ATARAX/VISTARIL) 10 MG  tablet Take 10 mg by mouth 3 (three) times daily as needed. nerves 06/25/12  Yes [name], MD  hyoscyamine (LEVSIN, ANASPAZ) 0.125 MG tablet Take 0.125 mg by mouth 4 (four) times daily as needed. Gas pain.   Yes Historical Provider, MD  levothyroxine (SYNTHROID, LEVOTHROID) 137 MCG tablet Take 1 tablet (137 mcg total) by mouth daily. 09/12/12  Yes [name], MD  methotrexate (RHEUMATREX) 2.5 MG tablet Take 10 mg by mouth once a week. Takes 4 tablets weekly on  Sunday.   Caution:Chemotherapy. Protect from light.   Yes Historical Provider, MD  pravastatin (PRAVACHOL) 40 MG tablet Take 1 tablet (40 mg total) by mouth daily.  06/25/12  Yes [name], MD  solifenacin (VESICARE) 5 MG tablet Take 5 mg by mouth daily.     Yes Historical Provider, MD  traMADol (ULTRAM) 50 MG tablet Take 50-100 mg by mouth 3 (three) times daily as needed. Pain.   Yes Historical Provider, MD  traZODone (DESYREL) 100 MG tablet Take 100 mg by mouth at bedtime.   Yes Historical Provider, MD    Prescriptions prior to admission  Medication Sig Dispense Refill  . albuterol (PROVENTIL HFA;VENTOLIN HFA) 108 (90 BASE) MCG/ACT inhaler Inhale 2 puffs into the lungs every 6 (six) hours as needed for wheezing or shortness of breath.  1 Inhaler  5  . Ascorbic Acid (VITAMIN C) 500 MG tablet Take 500 mg by mouth daily.        . cholecalciferol (VITAMIN D) 1000 UNITS tablet Take 1,000 Units by mouth daily.      . clopidogrel (PLAVIX) 75 MG tablet Take 1 tablet (75 mg total) by mouth daily.  90 tablet  3  . Cyanocobalamin (VITAMIN B-12 IJ) Inject 1,000 mcg into the muscle every 14 (fourteen) days. Every 2 weeks      . cyclobenzaprine (FLEXERIL) 10 MG tablet Take 5-10 mg by mouth 3 (three) times daily as needed. Muscle spasms.      . folic acid (FOLVITE) 1 MG tablet Take 1 mg by mouth daily.      . furosemide (LASIX) 20 MG tablet Take 1 tablet (20 mg total) by mouth 2 (two) times daily.  30 tablet  3  . gabapentin (NEURONTIN) 300 MG  capsule Take 1 capsule (300 mg total) by mouth at bedtime.  30 capsule  5  . hydrALAZINE (APRESOLINE) 25 MG tablet Take 12.5 mg by mouth 2 (two) times daily.      Marland Kitchen HYDROcodone-acetaminophen (VICODIN) 5-500 MG per tablet Take 1 tablet by mouth every 6 (six) hours as needed. Pain      . hydrOXYzine (ATARAX/VISTARIL) 10 MG tablet Take 10 mg by mouth 3 (three) times daily as needed. nerves      . hyoscyamine (LEVSIN, ANASPAZ) 0.125 MG tablet Take 0.125 mg by mouth 4 (four) times daily as needed. Gas pain.      Marland Kitchen levothyroxine (SYNTHROID, LEVOTHROID) 137 MCG tablet Take 1 tablet (137 mcg total) by mouth daily.  90 tablet  3  . methotrexate (RHEUMATREX) 2.5 MG tablet Take 10 mg by mouth once a week. Takes 4 tablets weekly on  Sunday.   Caution:Chemotherapy. Protect from light.      . pravastatin (PRAVACHOL) 40 MG tablet Take 1 tablet (40 mg total) by mouth daily.  30 tablet  5  . solifenacin (VESICARE) 5 MG tablet Take 5 mg by mouth daily.        . traMADol (ULTRAM) 50 MG tablet Take 50-100 mg by mouth 3 (three) times daily as needed. Pain.      . traZODone (DESYREL) 100 MG tablet Take 100 mg by mouth at bedtime.       Family History  Problem Relation Age of Onset  . Stroke Mother 52  . Coronary artery disease      father, brother, sister  . Alcohol abuse Father 38    History   Social History  . Marital Status: Married    Spouse Name: N/A    Number of Children: N/A  . Years of Education: N/A   Occupational History  . retired    Social History Main Topics  . Smoking status: Former  Smoker -- 0.5 packs/day for 25 years    Types: Cigarettes    Quit date: 12/25/1958  . Smokeless tobacco: Former Neurosurgeon     Comment: quit in 1950s  . Alcohol Use: No  . Drug Use: No  . Sexually Active: No   Other Topics Concern  . Not on file   Social History Narrative  . No narrative on file     ROS:  Constipation.  As stated in the HPI and negative for all other systems.  Physical Exam: Blood  pressure 97/78, pulse 80, temperature 97.2 F (36.2 C), temperature source Oral, resp. rate 18, height 5\' 8"  (1.727 m), weight 214 lb 4.6 oz (97.2 kg), SpO2 100.00%.  GENERAL:  Well appearing HEENT:  Pupils equal round and reactive, fundi not visualized, oral mucosa unremarkable NECK:  No jugular venous distention, waveform within normal limits, carotid upstroke brisk and symmetric, no bruits, no thyromegaly LYMPHATICS:  No cervical, inguinal adenopathy LUNGS:  Clear to auscultation bilaterally BACK:  No CVA tenderness CHEST:  Unremarkable HEART:  PMI not displaced or sustained,S1 and S2 within normal limits, no S3, no S4, no clicks, no rubs, no murmurs ABD:  Flat, positive bowel sounds normal in frequency in pitch, no bruits, no rebound, no guarding, no midline pulsatile mass, no hepatomegaly, no splenomegaly EXT:  2 plus pulses throughout, no edema, no cyanosis no clubbing SKIN:  No rashes no nodules NEURO:  Cranial nerves II through XII grossly intact, motor grossly intact throughout PSYCH:  Cognitively intact, oriented to person place and time   Labs: Lab Results  Component Value Date   BUN 15 12/24/2012   Lab Results  Component Value Date   CREATININE 1.62* 12/24/2012   Lab Results  Component Value Date   NA 139 12/24/2012   K 3.7 12/24/2012   CL 103 12/24/2012   CO2 26 12/24/2012   Lab Results  Component Value Date   CKTOTAL 52 06/19/2011   CKMB 2.6 06/19/2011   TROPONINI <0.30 12/24/2012   Lab Results  Component Value Date   WBC 8.7 12/24/2012   HGB 12.7* 12/24/2012   HCT 39.2 12/24/2012   MCV 103.4* 12/24/2012   PLT 264 12/24/2012   Lab Results  Component Value Date   CHOL 126 05/31/2012   HDL 50.60 05/31/2012   LDLCALC 57 05/31/2012   LDLDIRECT 143.3 04/16/2007   TRIG 94.0 05/31/2012   CHOLHDL 2 05/31/2012   Lab Results  Component Value Date   ALT 19 12/05/2012   AST 26 12/05/2012   ALKPHOS 69 12/05/2012   BILITOT 0.6 12/05/2012   Radiology:   CXR:   Worsening lung aeration with increasing right pleural effusion and  increasing bibasilar atelectasis or infiltrates.  EKG:  Atrial fibrillation, rate 87, no acute ST T wave changes.  12/24/2012  ASSESSMENT AND PLAN:   Syncope: I suspect this is related to orthostasis. I will discontinue his hydralazine.  I do not think he needs further diuresis. I don't think echocardiography will be helpful as he had one in September. He will need to orthostatics when he is strong enough to stand. I do not think that his current symptoms are related to his fibrillation.  Atrial fibrillation: He is not an anticoagulation candidate for multiple reasons listed above. I do not think this rhythm was a primary etiology for any of his complaints. He is currently back in sinus rhythm.  CAD: He had a negative stress perfusion study earlier this year. No further workup is suggested.  CHF: The patient does have a mildly elevated BNP and abnormal chest x-ray. However, he's not having any overt heart failure symptoms. I agree with holding diuretics for now.  SignedRollene Rotunda 12/24/2012, 5:34 PM

## 2012-12-24 NOTE — Consult Note (Signed)
PULMONARY  / CRITICAL CARE MEDICINE  Name: [name] MRN: [id number] DOB: May 24, 1923    LOS: 0  REFERRING MD :  Wonda Olds  CHIEF COMPLAINT: Syncope/fall   LINES / TUBES:   CULTURES:   ANTIBIOTICS:   SIGNIFICANT EVENTS:  12-31  Admit to ED with syncope.  LEVEL OF CARE:  sdu PRIMARY SERVICE:  [facility] CONSULTANTS: cards([place]) CODE STATUS: DIET:  Heart healthy DVT Px:  pas GI Px: ppi  HISTORY OF PRESENT ILLNESS:   [name] is a 78 yo male who has declined over the last 2 years since suffering a MI and gangrenous GB surgery. His best day is sob walking 10-15 feet. He has continued to decline especially over last 2 weeks. No appetite, general FTT that culminated with passing out(+Loss of consciousness) trauma to left head. When evaluated in ED he was hypotensive and in Afib with CVR. PCCM was called due to hypotension but this has stabilized. He does have a hx of PAF and is followed by [facility].  PAST MEDICAL HISTORY :  Past Medical History  Diagnosis Date  . CAD (coronary artery disease)     s/p NSTEMI and BMS stent diagonal07/09;  Lexiscan Myoview 9/13:  EF 62%, no ischemia  . AS (aortic stenosis)     a.  Echo 2009 mean gradient 9mm HG. AVA 2.18;  b.  Echo 4/11 mild AS mean gradient 10mm HG;  c.  Echo 04/2011: Mild LVH, EF 55-60%, grade 1 diastolic dysfunction, mild aortic stenosis, mean gradient 14, mild LAE;  d. Echo 9/13: mod LVH, EF 50-55%, mild to mod AS, mean gradient 17 mmHg  . AF (paroxysmal atrial fibrillation)     Holter monitor 2.5 sec pauses  . Fatigue     CPX 11/09 VO2  14.4 (75% predicte)d, slope 35.  O2 pulse normal.  VO2 corrected for body weight 17.6.  Marland Kitchen Hypothyroidism   . Hyperlipidemia   . History of prostate cancer   . Allergic rhinitis   . DM (diabetes mellitus)     type II diet controlled  . Shingles   . Left carotid stenosis   . Vitamin B 12 deficiency   . Anemia   . Osteoarthritis   . Nephrolithiasis   . Obesity   . OSA  (obstructive sleep apnea)     AHI 15 PSG 09/06/08  . Hx of colonoscopy   . Stroke   . Anxiety and depression   . PMR (polymyalgia rheumatica) 05/29/2012  . Hypogonadism male 05/29/2012  . Hyperparathyroidism 05/29/2012  . TIA (transient ischemic attack) 05/29/2012  . CKD (chronic kidney disease) stage 4, GFR 15-29 ml/min 05/29/2012  . COPD (chronic obstructive pulmonary disease) 05/29/2012   Past Surgical History  Procedure Date  . Appendectomy   . Hernia repair   . Right carpal tunnel release   . Lumbar spine surgery   . Cystectomy     neck  . Prostatectomy   . Vasectomy   . Total knee arthroplasty     right  . Cholecystectomy    Prior to Admission medications   Medication Sig Start Date End Date Taking? Authorizing Provider  albuterol (PROVENTIL HFA;VENTOLIN HFA) 108 (90 BASE) MCG/ACT inhaler Inhale 2 puffs into the lungs every 6 (six) hours as needed for wheezing or shortness of breath. 11/29/12  Yes [name], MD  Ascorbic Acid (VITAMIN C) 500 MG tablet Take 500 mg by mouth daily.     Yes Historical Provider, MD  cholecalciferol (VITAMIN D)  1000 UNITS tablet Take 1,000 Units by mouth daily.   Yes Historical Provider, MD  clopidogrel (PLAVIX) 75 MG tablet Take 1 tablet (75 mg total) by mouth daily. 09/12/12  Yes [name], MD  Cyanocobalamin (VITAMIN B-12 IJ) Inject 1,000 mcg into the muscle every 14 (fourteen) days. Every 2 weeks   Yes Historical Provider, MD  cyclobenzaprine (FLEXERIL) 10 MG tablet Take 5-10 mg by mouth 3 (three) times daily as needed. Muscle spasms.   Yes Historical Provider, MD  folic acid (FOLVITE) 1 MG tablet Take 1 mg by mouth daily.   Yes Historical Provider, MD  furosemide (LASIX) 20 MG tablet Take 1 tablet (20 mg total) by mouth 2 (two) times daily. 12/17/12  Yes Nicki Reaper, NP  gabapentin (NEURONTIN) 300 MG capsule Take 1 capsule (300 mg total) by mouth at bedtime. 06/25/12  Yes [name], MD  hydrALAZINE (APRESOLINE) 25 MG tablet Take 12.5 mg by mouth 2  (two) times daily.   Yes Historical Provider, MD  HYDROcodone-acetaminophen (VICODIN) 5-500 MG per tablet Take 1 tablet by mouth every 6 (six) hours as needed. Pain   Yes Historical Provider, MD  hydrOXYzine (ATARAX/VISTARIL) 10 MG tablet Take 10 mg by mouth 3 (three) times daily as needed. nerves 06/25/12  Yes [name], MD  hyoscyamine (LEVSIN, ANASPAZ) 0.125 MG tablet Take 0.125 mg by mouth 4 (four) times daily as needed. Gas pain.   Yes Historical Provider, MD  levothyroxine (SYNTHROID, LEVOTHROID) 137 MCG tablet Take 1 tablet (137 mcg total) by mouth daily. 09/12/12  Yes [name], MD  methotrexate (RHEUMATREX) 2.5 MG tablet Take 10 mg by mouth once a week. Takes 4 tablets weekly on  Sunday.   Caution:Chemotherapy. Protect from light.   Yes Historical Provider, MD  pravastatin (PRAVACHOL) 40 MG tablet Take 1 tablet (40 mg total) by mouth daily. 06/25/12  Yes [name], MD  solifenacin (VESICARE) 5 MG tablet Take 5 mg by mouth daily.     Yes Historical Provider, MD  traMADol (ULTRAM) 50 MG tablet Take 50-100 mg by mouth 3 (three) times daily as needed. Pain.   Yes Historical Provider, MD  traZODone (DESYREL) 100 MG tablet Take 100 mg by mouth at bedtime.   Yes Historical Provider, MD   Allergies  Allergen Reactions  . Bactrim (Sulfamethoxazole W-Trimethoprim) Other (See Comments)    hallucinations  . Horse-Derived Products Hives    FAMILY HISTORY:  Family History  Problem Relation Age of Onset  . Stroke Mother 63  . Coronary artery disease      father, brother, sister  . Alcohol abuse Father 75   SOCIAL HISTORY:  reports that he quit smoking about 54 years ago. His smoking use included Cigarettes. He has a 12.5 pack-year smoking history. He has quit using smokeless tobacco. He reports that he does not drink alcohol or use illicit drugs.  REVIEW OF SYSTEMS:  See hpi   INTERVAL HISTORY:   VITAL SIGNS: Temp:  [98.1 F (36.7 C)-98.4 F (36.9 C)] 98.1 F (36.7 C) (12/31  1306) Pulse Rate:  [69-85] 83  (12/31 1415) Resp:  [13-25] 17  (12/31 1415) BP: (74-117)/(52-73) 116/59 mmHg (12/31 1415) SpO2:  [96 %-100 %] 100 % (12/31 1415) HEMODYNAMICS:   VENTILATOR SETTINGS:   INTAKE / OUTPUT: Intake/Output    None     PHYSICAL EXAMINATION: General:  Elderly wm , nad at rest Neuro: intact, no carotid bruit HEENT: No LAN. + thrush Cardiovascular:  HSIR, no  murmur to carotids Lungs: decreased in bases Abdomen:  Obese + bs Musculoskeletal:  Rt ankle with chronic wound Skin:  Diffuse ecchymosis.    LABS: Cbc  Lab 12/24/12 1131  WBC 8.7  HGB 12.7*  HCT 39.2  PLT 264    Chemistry   Lab 12/24/12 1131  NA 139  K 3.7  CL 103  CO2 26  BUN 15  CREATININE 1.62*  CALCIUM 8.0*  MG --  PHOS --  GLUCOSE 109*    Liver fxn No results found for this basename: AST:3,ALT:3,ALKPHOS:3,BILITOT:3,PROT:3,ALBUMIN:3 in the last 168 hours coags No results found for this basename: APTT:3,INR:3 in the last 168 hours Sepsis markers  Lab 12/24/12 1131  LATICACIDVEN 1.1  PROCALCITON --   Cardiac markers  Lab 12/24/12 1131  CKTOTAL --  CKMB --  TROPONINI <0.30   BNP  Lab 12/24/12 1131  PROBNP 2861.0*   ABG No results found for this basename: PHART:3,PCO2ART:3,PO2ART:3,HCO3:3,TCO2:3 in the last 168 hours  CBG trend No results found for this basename: GLUCAP:5 in the last 168 hours  IMAGING: Ct Head Wo Contrast  12/24/2012  *RADIOLOGY REPORT*  Clinical Data:  Fall. Head and neck injury with loss of consciousness.  CT HEAD WITHOUT CONTRAST CT CERVICAL SPINE WITHOUT CONTRAST  Technique:  Multidetector CT imaging of the head and cervical spine was performed following the standard protocol without intravenous contrast.  Multiplanar CT image reconstructions of the cervical spine were also generated.  Comparison:  Head CT on 11/18/2012  CT HEAD  Findings: There is no evidence of intracranial hemorrhage, brain edema or other signs of acute infarction.   There is no evidence of intracranial mass lesion or mass effect.  No abnormal extra-axial fluid collections are identified.  Moderate cerebral atrophy is stable.  Ventricles are stable in size.  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  No skull fracture or other significant bone abnormality identified.  IMPRESSION:  1.  No acute intracranial findings. 2.  Stable cerebral atrophy and old right basal ganglia lacune.  CT CERVICAL SPINE  Findings: No evidence of acute cervical spine fracture.  Severe degenerative disc disease is seen at levels of C5-6, C6-7, and C7- T1.  Severe facet DJD and ankylosis seen bilaterally at C3-4 and C4- 5.  Facet DJD also seen at other cervical levels, and atlantoaxial degenerative changes are also noted.  Grade 1 anterolisthesis noted at C4-5 measuring approximately 3 mm, which is attributable to the facet DJD seen at this level.  IMPRESSION:  1.  No evidence of acute cervical spine fracture. 2.  Advanced degenerative spondylosis, with grade 1 anterolisthesis at C4-5.   Original Report Authenticated By: Myles Rosenthal, M.D.    Ct Cervical Spine Wo Contrast  12/24/2012  *RADIOLOGY REPORT*  Clinical Data:  Fall. Head and neck injury with loss of consciousness.  CT HEAD WITHOUT CONTRAST CT CERVICAL SPINE WITHOUT CONTRAST  Technique:  Multidetector CT imaging of the head and cervical spine was performed following the standard protocol without intravenous contrast.  Multiplanar CT image reconstructions of the cervical spine were also generated.  Comparison:  Head CT on 11/18/2012  CT HEAD  Findings: There is no evidence of intracranial hemorrhage, brain edema or other signs of acute infarction.  There is no evidence of intracranial mass lesion or mass effect.  No abnormal extra-axial fluid collections are identified.  Moderate cerebral atrophy is stable.  Ventricles are stable in size.  Old lacunar infarct involving the right left lentiform nucleus  is stable.   No skull fracture or other significant bone abnormality identified.  IMPRESSION:  1.  No acute intracranial findings. 2.  Stable cerebral atrophy and old right basal ganglia lacune.  CT CERVICAL SPINE  Findings: No evidence of acute cervical spine fracture.  Severe degenerative disc disease is seen at levels of C5-6, C6-7, and C7- T1.  Severe facet DJD and ankylosis seen bilaterally at C3-4 and C4- 5.  Facet DJD also seen at other cervical levels, and atlantoaxial degenerative changes are also noted.  Grade 1 anterolisthesis noted at C4-5 measuring approximately 3 mm, which is attributable to the facet DJD seen at this level.  IMPRESSION:  1.  No evidence of acute cervical spine fracture. 2.  Advanced degenerative spondylosis, with grade 1 anterolisthesis at C4-5.   Original Report Authenticated By: Myles Rosenthal, M.D.    Dg Chest Port 1 View  12/24/2012  *RADIOLOGY REPORT*  Clinical Data: Larey Seat.  Weakness.  PORTABLE CHEST - 1 VIEW  Comparison: 12/05/2012.  Findings: The cardiac silhouette, mediastinal and hilar contours are stable.  There is an enlarging right pleural effusion with overlying atelectasis or infiltrate.  Persistent left basilar atelectasis or infiltrate.  No definite pneumothorax.  IMPRESSION: Worsening lung aeration with increasing right pleural effusion and increasing bibasilar atelectasis or infiltrates.   Original Report Authenticated By: Rudie Meyer, M.D.     ECG:  DIAGNOSES: Active Problems:  LIPOMA  HYPOTHYROIDISM  HYPERLIPIDEMIA  HYPERKALEMIA  HYPOKALEMIA  Pernicious anemia  ANEMIA  DEPRESSIVE DISORDER NOT ELSEWHERE CLASSIFIED  SLEEP APNEA, OBSTRUCTIVE  HYPERTENSION, BENIGN  CORONARY ARTERY DISEASE  AORTIC STENOSIS/ INSUFFICIENCY, NON-RHEUMATIC  Atrial flutter  RECTAL BLEEDING  RENAL FAILURE  AS (aortic stenosis)  AF (paroxysmal atrial fibrillation)  DM (diabetes mellitus)  Left carotid stenosis  CKD (chronic kidney disease) stage 4, GFR 15-29 ml/min  COPD (chronic  obstructive pulmonary disease)  Syncope    ASSESSMENT / PLAN:  PULMONARY  ASSESSMENT: COPD Rt pleural effusion PLAN:   -O2 as needed -? Thoracentesis in future for Dx if not improved with lasix in future -BD  CARDIOVASCULAR  ASSESSMENT: PAF/AF CAD AS CHF PLAN:  -cards consult (called) -2 d echo(ordered), asses worsening AS Consider carotid US -No anticoagulation as outpt noted -diuresis if possible(bnp 2861) in future Control rate tsh Lactic supports perfusion  RENAL Lab Results  Component Value Date   CREATININE 1.62* 12/24/2012   CREATININE 2.1* 12/05/2012   CREATININE 1.8* 11/27/2012    ASSESSMENT:  Renal Insuff PLAN:   -monitor renal function  GASTROINTESTINAL  ASSESSMENT:  GI protection PLAN:   -PPI  HEMATOLOGIC  Basename 12/24/12 1131  HGB 12.7*   ASSESSMENT:   Chronic anemia PLAN:  -monitor cbc  INFECTIOUS  ASSESSMENT:   CxR with ATX  Chronic lower ext wound PLAN:   -no abx at this time  ENDOCRINE  ASSESSMENT:   DM   PLAN:   -ssi per [facility]  NEUROLOGIC  ASSESSMENT:   Syncope with + LOC. Negative CT head PLAN:   -cards work up.  Global: General FTT. Family wants full code but no prolonged life support. Concern is worsening AS, Afib causing syncope. Admit tele, no icu required at this stage  I have personally obtained a history, examined the patient, evaluated laboratory and imaging results, formulated the assessment and plan and placed orders.  Mcarthur Rossetti. Tyson Alias, MD, FACP Pgr: [phone] [facility]  Pulmonary and Critical Care Medicine [facility] Pager: [phone]  12/24/2012, 2:34 PM

## 2012-12-24 NOTE — Progress Notes (Signed)
Pt transferred to room 4729 from the ED. Oriented pt to room and placed call bell in reach. Family at bedside. VS WNL. Assessment completed. Call bell in reach. Will continue to monitor pt closely.  Juliane Lack, RN

## 2012-12-25 ENCOUNTER — Inpatient Hospital Stay (HOSPITAL_COMMUNITY): Payer: Medicare Other

## 2012-12-25 DIAGNOSIS — D649 Anemia, unspecified: Secondary | ICD-10-CM

## 2012-12-25 DIAGNOSIS — R55 Syncope and collapse: Principal | ICD-10-CM

## 2012-12-25 DIAGNOSIS — J309 Allergic rhinitis, unspecified: Secondary | ICD-10-CM

## 2012-12-25 LAB — CBC
HCT: 32.7 % — ABNORMAL LOW (ref 39.0–52.0)
MCHC: 32.1 g/dL (ref 30.0–36.0)
MCV: 104.1 fL — ABNORMAL HIGH (ref 78.0–100.0)
Platelets: 234 10*3/uL (ref 150–400)
RDW: 14.8 % (ref 11.5–15.5)

## 2012-12-25 LAB — GLUCOSE, CAPILLARY
Glucose-Capillary: 97 mg/dL (ref 70–99)
Glucose-Capillary: 120 mg/dL — ABNORMAL HIGH (ref 70–99)
Glucose-Capillary: 128 mg/dL — ABNORMAL HIGH (ref 70–99)

## 2012-12-25 LAB — BASIC METABOLIC PANEL
BUN: 15 mg/dL (ref 6–23)
Calcium: 8.4 mg/dL (ref 8.4–10.5)
Creatinine, Ser: 1.52 mg/dL — ABNORMAL HIGH (ref 0.50–1.35)
GFR calc Af Amer: 46 mL/min — ABNORMAL LOW (ref >90)
GFR calc non Af Amer: 39 mL/min — ABNORMAL LOW (ref >90)

## 2012-12-25 LAB — URINE CULTURE: Colony Count: NO GROWTH

## 2012-12-25 LAB — PHOSPHORUS: Phosphorus: 3.8 mg/dL (ref 2.3–4.6)

## 2012-12-25 LAB — HEMOGLOBIN A1C: Mean Plasma Glucose: 123 mg/dL — ABNORMAL HIGH (ref <117)

## 2012-12-25 MED ORDER — DILTIAZEM HCL 100 MG IV SOLR
5.0000 mg/h | INTRAVENOUS | Status: DC
  Administered 2012-12-25: 5 mg/h via INTRAVENOUS
  Filled 2012-12-25: qty 100

## 2012-12-25 MED ORDER — FUROSEMIDE 10 MG/ML IJ SOLN
INTRAMUSCULAR | Status: AC
  Administered 2012-12-25: 20 mg via INTRAVENOUS
  Filled 2012-12-25: qty 4

## 2012-12-25 MED ORDER — FUROSEMIDE 20 MG PO TABS
20.0000 mg | ORAL_TABLET | Freq: Every day | ORAL | Status: DC
  Filled 2012-12-25: qty 1

## 2012-12-25 MED ORDER — IPRATROPIUM BROMIDE 0.02 % IN SOLN
0.5000 mg | Freq: Four times a day (QID) | RESPIRATORY_TRACT | Status: DC | PRN
  Administered 2012-12-28: 0.5 mg via RESPIRATORY_TRACT
  Filled 2012-12-25: qty 2.5

## 2012-12-25 MED ORDER — ALBUTEROL SULFATE (5 MG/ML) 0.5% IN NEBU
2.5000 mg | INHALATION_SOLUTION | Freq: Two times a day (BID) | RESPIRATORY_TRACT | Status: DC
  Administered 2012-12-25 – 2012-12-30 (×10): 2.5 mg via RESPIRATORY_TRACT
  Filled 2012-12-25 (×10): qty 0.5

## 2012-12-25 MED ORDER — SODIUM CHLORIDE 0.9 % IV SOLN
INTRAVENOUS | Status: DC | PRN
  Administered 2012-12-25: 23:00:00 via INTRAVENOUS

## 2012-12-25 MED ORDER — AZITHROMYCIN 250 MG PO TABS
250.0000 mg | ORAL_TABLET | Freq: Every day | ORAL | Status: DC
  Administered 2012-12-25: 250 mg via ORAL
  Filled 2012-12-25: qty 1

## 2012-12-25 MED ORDER — FUROSEMIDE 10 MG/ML IJ SOLN
20.0000 mg | Freq: Once | INTRAMUSCULAR | Status: AC
  Administered 2012-12-25: 20 mg via INTRAVENOUS

## 2012-12-25 MED ORDER — FUROSEMIDE 20 MG PO TABS: 20.0000 mg | ORAL_TABLET | Freq: Every day | ORAL | Status: DC

## 2012-12-25 MED ORDER — SODIUM CHLORIDE 0.9 % IV SOLN
INTRAVENOUS | Status: AC
  Administered 2012-12-25: 13:00:00 via INTRAVENOUS

## 2012-12-25 MED ORDER — DEXTROSE 5 % IV SOLN
1.0000 g | INTRAVENOUS | Status: DC
  Administered 2012-12-25: 1 g via INTRAVENOUS
  Filled 2012-12-25: qty 10

## 2012-12-25 NOTE — Progress Notes (Signed)
SUBJECTIVE:  Weak all over.  No chest pain.     PHYSICAL EXAM Filed Vitals:   12/24/12 2318 12/25/12 0420 12/25/12 0504 12/25/12 0836  BP: 105/51  108/64   Pulse: 85 85 89   Temp: 98.2 F (36.8 C)  99.4 F (37.4 C)   TempSrc: Oral  Axillary   Resp: 18 18 18    Height:      Weight:   209 lb 3.5 oz (94.9 kg)   SpO2: 93% 94% 90% 92%   General:  No distress Lungs:  Clear Heart:  RRR Abdomen:  Positive bowel sounds, no rebound no guarding Extremities:  No edema.   LABS: Lab Results  Component Value Date   CKTOTAL 52 06/19/2011   CKMB 2.6 06/19/2011   TROPONINI <0.30 12/24/2012   Results for orders placed during the hospital encounter of 12/24/12 (from the past 24 hour(s))  OCCULT BLOOD, POC DEVICE     Status: Abnormal   Collection Time   12/24/12 11:25 AM      Component Value Range   Fecal Occult Bld POSITIVE (*) NEGATIVE  CBC WITH DIFFERENTIAL     Status: Abnormal   Collection Time   12/24/12 11:31 AM      Component Value Range   WBC 8.7  4.0 - 10.5 K/uL   RBC 3.79 (*) 4.22 - 5.81 MIL/uL   Hemoglobin 12.7 (*) 13.0 - 17.0 g/dL   HCT 16.1  09.6 - 04.5 %   MCV 103.4 (*) 78.0 - 100.0 fL   MCH 33.5  26.0 - 34.0 pg   MCHC 32.4  30.0 - 36.0 g/dL   RDW 40.9  81.1 - 91.4 %   Platelets 264  150 - 400 K/uL   Neutrophils Relative 71  43 - 77 %   Neutro Abs 6.2  1.7 - 7.7 K/uL   Lymphocytes Relative 18  12 - 46 %   Lymphs Abs 1.5  0.7 - 4.0 K/uL   Monocytes Relative 8  3 - 12 %   Monocytes Absolute 0.7  0.1 - 1.0 K/uL   Eosinophils Relative 3  0 - 5 %   Eosinophils Absolute 0.2  0.0 - 0.7 K/uL   Basophils Relative 0  0 - 1 %   Basophils Absolute 0.0  0.0 - 0.1 K/uL  BASIC METABOLIC PANEL     Status: Abnormal   Collection Time   12/24/12 11:31 AM      Component Value Range   Sodium 139  135 - 145 mEq/L   Potassium 3.7  3.5 - 5.1 mEq/L   Chloride 103  96 - 112 mEq/L   CO2 26  19 - 32 mEq/L   Glucose, Bld 109 (*) 70 - 99 mg/dL   BUN 15  6 - 23 mg/dL   Creatinine, Ser  7.82 (*) 0.50 - 1.35 mg/dL   Calcium 8.0 (*) 8.4 - 10.5 mg/dL   GFR calc non Af Amer 37 (*) >90 mL/min   GFR calc Af Amer 42 (*) >90 mL/min  LACTIC ACID, PLASMA     Status: Normal   Collection Time   12/24/12 11:31 AM      Component Value Range   Lactic Acid, Venous 1.1  0.5 - 2.2 mmol/L  TROPONIN I     Status: Normal   Collection Time   12/24/12 11:31 AM      Component Value Range   Troponin I <0.30  <0.30 ng/mL  PRO B NATRIURETIC PEPTIDE  Status: Abnormal   Collection Time   12/24/12 11:31 AM      Component Value Range   Pro B Natriuretic peptide (BNP) 2861.0 (*) 0 - 450 pg/mL  URINALYSIS, ROUTINE W REFLEX MICROSCOPIC     Status: Normal   Collection Time   12/24/12 12:07 PM      Component Value Range   Color, Urine YELLOW  YELLOW   APPearance CLEAR  CLEAR   Specific Gravity, Urine 1.019  1.005 - 1.030   pH 5.5  5.0 - 8.0   Glucose, UA NEGATIVE  NEGATIVE mg/dL   Hgb urine dipstick NEGATIVE  NEGATIVE   Bilirubin Urine NEGATIVE  NEGATIVE   Ketones, ur NEGATIVE  NEGATIVE mg/dL   Protein, ur NEGATIVE  NEGATIVE mg/dL   Urobilinogen, UA 1.0  0.0 - 1.0 mg/dL   Nitrite NEGATIVE  NEGATIVE   Leukocytes, UA NEGATIVE  NEGATIVE  PRO B NATRIURETIC PEPTIDE     Status: Abnormal   Collection Time   12/24/12  3:18 PM      Component Value Range   Pro B Natriuretic peptide (BNP) 2788.0 (*) 0 - 450 pg/mL  GLUCOSE, CAPILLARY     Status: Abnormal   Collection Time   12/24/12  4:35 PM      Component Value Range   Glucose-Capillary 126 (*) 70 - 99 mg/dL  TSH     Status: Abnormal   Collection Time   12/24/12  5:31 PM      Component Value Range   TSH 0.333 (*) 0.350 - 4.500 uIU/mL  TROPONIN I     Status: Normal   Collection Time   12/24/12  5:31 PM      Component Value Range   Troponin I <0.30  <0.30 ng/mL  HEMOGLOBIN A1C     Status: Abnormal   Collection Time   12/24/12  5:31 PM      Component Value Range   Hemoglobin A1C 5.9 (*) <5.7 %   Mean Plasma Glucose 123 (*) <117 mg/dL    GLUCOSE, CAPILLARY     Status: Abnormal   Collection Time   12/24/12 10:36 PM      Component Value Range   Glucose-Capillary 112 (*) 70 - 99 mg/dL  TROPONIN I     Status: Normal   Collection Time   12/24/12 11:30 PM      Component Value Range   Troponin I <0.30  <0.30 ng/mL  GLUCOSE, CAPILLARY     Status: Normal   Collection Time   12/25/12  5:44 AM      Component Value Range   Glucose-Capillary 97  70 - 99 mg/dL  CBC     Status: Abnormal   Collection Time   12/25/12  6:41 AM      Component Value Range   WBC 6.4  4.0 - 10.5 K/uL   RBC 3.14 (*) 4.22 - 5.81 MIL/uL   Hemoglobin 10.5 (*) 13.0 - 17.0 g/dL   HCT 81.1 (*) 91.4 - 78.2 %   MCV 104.1 (*) 78.0 - 100.0 fL   MCH 33.4  26.0 - 34.0 pg   MCHC 32.1  30.0 - 36.0 g/dL   RDW 95.6  21.3 - 08.6 %   Platelets 234  150 - 400 K/uL  BASIC METABOLIC PANEL     Status: Abnormal   Collection Time   12/25/12  6:41 AM      Component Value Range   Sodium 140  135 - 145 mEq/L   Potassium  3.7  3.5 - 5.1 mEq/L   Chloride 104  96 - 112 mEq/L   CO2 28  19 - 32 mEq/L   Glucose, Bld 96  70 - 99 mg/dL   BUN 15  6 - 23 mg/dL   Creatinine, Ser 9.81 (*) 0.50 - 1.35 mg/dL   Calcium 8.4  8.4 - 19.1 mg/dL   GFR calc non Af Amer 39 (*) >90 mL/min   GFR calc Af Amer 46 (*) >90 mL/min  PHOSPHORUS     Status: Normal   Collection Time   12/25/12  6:41 AM      Component Value Range   Phosphorus 3.8  2.3 - 4.6 mg/dL  MAGNESIUM     Status: Normal   Collection Time   12/25/12  6:41 AM      Component Value Range   Magnesium 1.9  1.5 - 2.5 mg/dL    Intake/Output Summary (Last 24 hours) at 12/25/12 0844 Last data filed at 12/25/12 0815  Gross per 24 hour  Intake    753 ml  Output    325 ml  Net    428 ml    ASSESSMENT AND PLAN:  Syncope:  I suspect this is related to orthostasis. We have discontinued his hydralazine. I do not think he needs further diuresis. Lungs are clear today.  Continue to hold diuresis.  I don't think echocardiography will be  helpful as he had one in September so a cancelled this. Marland Kitchen He will need to orthostatics when he is strong enough to stand.  Atrial fibrillation:  He is not an anticoagulation candidate for multiple reasons listed above. I do not think this rhythm was a primary etiology for any of his complaints. He converted to NSR yesterday and remains in NSR.   CAD:  He had a negative stress perfusion study earlier this year. No further workup is suggested.   CHF:  The patient does have a mildly elevated BNP and abnormal chest x-ray. However, he's not having any overt heart failure symptoms. I agree with holding diuretics for now.   Fayrene Fearing [facility] 12/25/2012 8:44 AM

## 2012-12-25 NOTE — Progress Notes (Signed)
Patient refusing CPAP at this time. Encouraged to call if he changes his mind. RT will continue to monitor.

## 2012-12-25 NOTE — Telephone Encounter (Signed)
No need for now, pt is currently hospitalized as inpt

## 2012-12-25 NOTE — Progress Notes (Signed)
Patient has a increased heart range btw (120s-140s).  Cardiologist notified.  EKG obtained which showed afib with RVR.  Cardizem drip ordered.  Will continue to monitor patient.

## 2012-12-25 NOTE — Progress Notes (Signed)
[facility]                                                                                Patient Demographics  [name], is a 79 y.o. male  WUJ:[id number]  [id number]:[id number]  DOB - 1923/02/15  Admit date - 12/24/2012  Admitting Physician [name], MD  Outpatient Primary MD for the patient is [name], MD  LOS - 1   Chief Complaint  Patient presents with  . Near Syncope        Assessment & Plan    1. Syncope and fall - likely due to hypotension from possible over diuresis, patient was hypotensive on admission, diuresis has been held, gentle IV fluids, appreciate cardiology input, no further cardiac workup per cardiology, recent echo gram noted with chronic diastolic CHF, chest x-ray shows chronic mild pleural effusions, CT brain and neck stable. Ruled out for MI with serial negative troponins, Will monitor orthostatics, PT eval, increase activity as tolerated.    2. History of chronic diastolic CHF, CAD, intermittent atrial fibrillation, Mild to moderate Aortic stenosis - currently in sinus rhythm, poor candidate for anticoagulation due to history of falls and poor balance, cardiology following the patient, no acute issues, diuresis held due to hypotension, will monitor clinically. Is on Plavix for his CAD.   3. History of COPD and OSA - stable, when necessary nebulizer treatments and oxygen as needed. CPAP at night if he uses.    4. Chronic kidney disease stage IV baseline creatinine anywhere between 1.8-2. Stable.    5. Chronic anemia stable no acute issues.    6. History of hypothyroidism. Continue home dose Synthroid, TSH is mildly low, repeat TSH along with free T4 in few weeks.  Lab Results  Component Value Date   TSH 0.333* 12/24/2012     7. Diabetes mellitus type 2. Continue him on sliding scale insulin, last A1c is 5.9. Glucose control is stable.  CBG (last 3)   Basename 12/25/12 0544 12/24/12 2236 12/24/12 1635  GLUCAP 97  112* 126*        Code Status: Full  Family Communication: discussed with the patient  Disposition Plan: To be decided   Procedures CT brain and C-spine, recent echo gram   Consults neurology Dr. Antoine Poche   DVT Prophylaxis  Lovenox   Lab Results  Component Value Date   PLT 234 12/25/2012    Medications  Scheduled Meds:   . albuterol  2.5 mg Nebulization BID  . cholecalciferol  1,000 Units Oral Daily  . clopidogrel  75 mg Oral Daily  . darifenacin  7.5 mg Oral Daily  . docusate sodium  100 mg Oral BID  . enoxaparin (LOVENOX) injection  40 mg Subcutaneous Q24H  . folic acid  1 mg Oral Daily  . gabapentin  300 mg Oral QHS  . insulin aspart  0-15 Units Subcutaneous TID WC  . levothyroxine  137 mcg Oral QAC breakfast  . senna  1 tablet Oral BID  . simvastatin  20 mg Oral q1800  . sodium chloride  3 mL Intravenous Q12H  . traZODone  100 mg Oral QHS   Continuous Infusions:   . sodium chloride  PRN Meds:.acetaminophen, albuterol, HYDROcodone-acetaminophen, ipratropium, ondansetron (ZOFRAN) IV  Antibiotics    Anti-infectives     Start     Dose/Rate Route Frequency Ordered Stop   12/25/12 0900   azithromycin (ZITHROMAX) tablet 250 mg  Status:  Discontinued        250 mg Oral Daily 12/25/12 0828 12/25/12 1057   12/25/12 0900   cefTRIAXone (ROCEPHIN) 1 g in dextrose 5 % 50 mL IVPB  Status:  Discontinued        1 g 100 mL/hr over 30 Minutes Intravenous Every 24 hours 12/25/12 0828 12/25/12 1057   12/24/12 1345   azithromycin (ZITHROMAX) 500 mg in dextrose 5 % 250 mL IVPB        500 mg 250 mL/hr over 60 Minutes Intravenous  Once 12/24/12 1342 12/24/12 1545   12/24/12 1345   cefTRIAXone (ROCEPHIN) 1 g in dextrose 5 % 50 mL IVPB        1 g 100 mL/hr over 30 Minutes Intravenous  Once 12/24/12 1342 12/24/12 1425           Time Spent in minutes   35   Kemyra August K M.D on 12/25/2012 at 10:58 AM  Between 7am to 7pm - Pager - [phone]  After 7pm go  to [email or web address] - password [email or web address]  And look for the night coverage person covering for me after hours  [facility] Office  [phone]    Subjective:   [name] today has, No headache, No chest pain, No abdominal pain - No Nausea, No new weakness tingling or numbness, No Cough - SOB. Feels weak all over.  Objective:   Filed Vitals:   12/24/12 2318 12/25/12 0420 12/25/12 0504 12/25/12 0836  BP: 105/51  108/64   Pulse: 85 85 89   Temp: 98.2 F (36.8 C)  99.4 F (37.4 C)   TempSrc: Oral  Axillary   Resp: 18 18 18    Height:      Weight:   94.9 kg (209 lb 3.5 oz)   SpO2: 93% 94% 90% 92%    Wt Readings from Last 3 Encounters:  12/25/12 94.9 kg (209 lb 3.5 oz)  12/05/12 91.74 kg (202 lb 4 oz)  11/27/12 94.348 kg (208 lb)     Intake/Output Summary (Last 24 hours) at 12/25/12 1058 Last data filed at 12/25/12 1000  Gross per 24 hour  Intake    753 ml  Output    425 ml  Net    328 ml    Exam Awake Alert, Oriented X 3, No new F.N deficits, Normal affect [place].AT,PERRAL Supple Neck,No JVD, No cervical lymphadenopathy appriciated.  Symmetrical Chest wall movement, Good air movement bilaterally, CTAB RRR,No Gallops,Rubs or new Murmurs, No Parasternal Heave +ve B.Sounds, Abd Soft, Non tender, No organomegaly appriciated, No rebound - guarding or rigidity. No Cyanosis, Clubbing or edema, No new Rash or bruise      Data Review   Micro Results No results found for this or any previous visit (from the past 240 hour(s)).  Radiology Reports    Ct Head Wo Contrast  12/24/2012  *RADIOLOGY REPORT*  Clinical Data:  Fall. Head and neck injury with loss of consciousness.  CT HEAD WITHOUT CONTRAST CT CERVICAL SPINE WITHOUT CONTRAST  Technique:  Multidetector CT imaging of the head and cervical spine was performed following the standard protocol without intravenous contrast.  Multiplanar CT image reconstructions of the cervical spine were also generated.  Comparison:   Head CT on 11/18/2012  CT  HEAD  Findings: There is no evidence of intracranial hemorrhage, brain edema or other signs of acute infarction.  There is no evidence of intracranial mass lesion or mass effect.  No abnormal extra-axial fluid collections are identified.  Moderate cerebral atrophy is stable.  Ventricles are stable in size.  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  No skull fracture or other significant bone abnormality identified.  IMPRESSION:  1.  No acute intracranial findings. 2.  Stable cerebral atrophy and old right basal ganglia lacune.  CT CERVICAL SPINE  Findings: No evidence of acute cervical spine fracture.  Severe degenerative disc disease is seen at levels of C5-6, C6-7, and C7- T1.  Severe facet DJD and ankylosis seen bilaterally at C3-4 and C4- 5.  Facet DJD also seen at other cervical levels, and atlantoaxial degenerative changes are also noted.  Grade 1 anterolisthesis noted at C4-5 measuring approximately 3 mm, which is attributable to the facet DJD seen at this level.  IMPRESSION:  1.  No evidence of acute cervical spine fracture. 2.  Advanced degenerative spondylosis, with grade 1 anterolisthesis at C4-5.   Original Report Authenticated By: Myles Rosenthal, M.D.    Ct Cervical Spine Wo Contrast  12/24/2012  *RADIOLOGY REPORT*  Clinical Data:  Fall. Head and neck injury with loss of consciousness.  CT HEAD WITHOUT CONTRAST CT CERVICAL SPINE WITHOUT CONTRAST  Technique:  Multidetector CT imaging of the head and cervical spine was performed following the standard protocol without intravenous contrast.  Multiplanar CT image reconstructions of the cervical spine were also generated.  Comparison:  Head CT on 11/18/2012  CT HEAD  Findings: There is no evidence of intracranial hemorrhage, brain edema or other signs of acute infarction.  There is no evidence of intracranial mass lesion or mass effect.  No abnormal extra-axial fluid collections are identified.   Moderate cerebral atrophy is stable.  Ventricles are stable in size.  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  No skull fracture or other significant bone abnormality identified.  IMPRESSION:  1.  No acute intracranial findings. 2.  Stable cerebral atrophy and old right basal ganglia lacune.  CT CERVICAL SPINE  Findings: No evidence of acute cervical spine fracture.  Severe degenerative disc disease is seen at levels of C5-6, C6-7, and C7- T1.  Severe facet DJD and ankylosis seen bilaterally at C3-4 and C4- 5.  Facet DJD also seen at other cervical levels, and atlantoaxial degenerative changes are also noted.  Grade 1 anterolisthesis noted at C4-5 measuring approximately 3 mm, which is attributable to the facet DJD seen at this level.  IMPRESSION:  1.  No evidence of acute cervical spine fracture. 2.  Advanced degenerative spondylosis, with grade 1 anterolisthesis at C4-5.   Original Report Authenticated By: Myles Rosenthal, M.D.    Dg Chest Port 1 View  12/25/2012  *RADIOLOGY REPORT*  Clinical Data: Shortness of breath and cough.  Evaluate pleural effusion.  PORTABLE CHEST - 1 VIEW  Comparison: 1 day prior  Findings: Cardiomegaly accentuated by AP portable technique. Slight increase in a layering small to moderate right pleural effusion.  A small left pleural effusion is similar. No pneumothorax.  Underlying asymmetric interstitial edema is greater on the right than left and not significantly changed.  Right greater left bibasilar airspace disease is unchanged.  IMPRESSION:  1. Minimal worsening in small to moderate right pleural effusion. 2.  Otherwise, similar appearance of the chest with asymmetric interstitial edema  and bibasilar atelectasis or infection.   Original Report Authenticated By: Jeronimo Greaves, M.D.    Dg Chest Port 1 View  12/24/2012  *RADIOLOGY REPORT*  Clinical Data: Larey Seat.  Weakness.  PORTABLE CHEST - 1 VIEW  Comparison: 12/05/2012.  Findings: The cardiac  silhouette, mediastinal and hilar contours are stable.  There is an enlarging right pleural effusion with overlying atelectasis or infiltrate.  Persistent left basilar atelectasis or infiltrate.  No definite pneumothorax.  IMPRESSION: Worsening lung aeration with increasing right pleural effusion and increasing bibasilar atelectasis or infiltrates.   Original Report Authenticated By: Rudie Meyer, M.D.     CBC  Lab 12/25/12 0641 12/24/12 1131  WBC 6.4 8.7  HGB 10.5* 12.7*  HCT 32.7* 39.2  PLT 234 264  MCV 104.1* 103.4*  MCH 33.4 33.5  MCHC 32.1 32.4  RDW 14.8 14.5  LYMPHSABS -- 1.5  MONOABS -- 0.7  EOSABS -- 0.2  BASOSABS -- 0.0  BANDABS -- --    Chemistries   Lab 12/25/12 0641 12/24/12 1131  NA 140 139  K 3.7 3.7  CL 104 103  CO2 28 26  GLUCOSE 96 109*  BUN 15 15  CREATININE 1.52* 1.62*  CALCIUM 8.4 8.0*  MG 1.9 --  AST -- --  ALT -- --  ALKPHOS -- --  BILITOT -- --   ------------------------------------------------------------------------------------------------------------------ estimated creatinine clearance is 38.3 ml/min (by C-G formula based on Cr of 1.52). ------------------------------------------------------------------------------------------------------------------  [facility] 12/24/12 1731  HGBA1C 5.9*   ------------------------------------------------------------------------------------------------------------------ No results found for this basename: CHOL:2,HDL:2,LDLCALC:2,TRIG:2,CHOLHDL:2,LDLDIRECT:2 in the last 72 hours ------------------------------------------------------------------------------------------------------------------  [facility] 12/24/12 1731  TSH 0.333*  T4TOTAL --  T3FREE --  THYROIDAB --   ------------------------------------------------------------------------------------------------------------------ No results found for this basename: VITAMINB12:2,FOLATE:2,FERRITIN:2,TIBC:2,IRON:2,RETICCTPCT:2 in the last 72  hours  Coagulation profile No results found for this basename: INR:5,PROTIME:5 in the last 168 hours  No results found for this basename: DDIMER:2 in the last 72 hours  Cardiac Enzymes  Lab 12/24/12 2330 12/24/12 1731 12/24/12 1131  CKMB -- -- --  TROPONINI <0.30 <0.30 <0.30  MYOGLOBIN -- -- --   ------------------------------------------------------------------------------------------------------------------ No components found with this basename: POCBNP:3

## 2012-12-26 LAB — GLUCOSE, CAPILLARY
Glucose-Capillary: 93 mg/dL (ref 70–99)
Glucose-Capillary: 203 mg/dL — ABNORMAL HIGH (ref 70–99)

## 2012-12-26 LAB — CBC
MCHC: 31.9 g/dL (ref 30.0–36.0)
MCV: 104 fL — ABNORMAL HIGH (ref 78.0–100.0)
Platelets: 269 10*3/uL (ref 150–400)
RDW: 14.4 % (ref 11.5–15.5)
WBC: 9.2 10*3/uL (ref 4.0–10.5)

## 2012-12-26 MED ORDER — ATORVASTATIN CALCIUM 10 MG PO TABS
10.0000 mg | ORAL_TABLET | Freq: Every day | ORAL | Status: DC
  Administered 2012-12-26 – 2012-12-29 (×4): 10 mg via ORAL
  Filled 2012-12-26 (×5): qty 1

## 2012-12-26 MED ORDER — DILTIAZEM HCL 60 MG PO TABS
60.0000 mg | ORAL_TABLET | Freq: Three times a day (TID) | ORAL | Status: DC
  Administered 2012-12-26 – 2012-12-27 (×3): 60 mg via ORAL
  Filled 2012-12-26 (×6): qty 1

## 2012-12-26 NOTE — Progress Notes (Signed)
Pt. BP is 93/58. HR however is still ranging  95 -110 bpm. Called MD on call for [facility] and notified of BP and HR. Was told to continue cardiziem drip at this time.

## 2012-12-26 NOTE — Progress Notes (Signed)
[facility]                                                                                Patient Demographics  [name], is a 79 y.o. male  QMV:[id number]  MWU:[id number]  DOB - February 01, 1923  Admit date - 12/24/2012  Admitting Physician [name], MD  Outpatient Primary MD for the patient is [name], MD  LOS - 2   Chief Complaint  Patient presents with  . Near Syncope        Assessment & Plan    1. Syncope and fall - likely due to hypotension from possible over diuresis, patient was hypotensive on admission, diuresis has been held, gentle IV fluids, appreciate cardiology input, no further cardiac workup per cardiology, recent echo gram noted with chronic diastolic CHF, chest x-ray shows chronic mild pleural effusions, CT brain and neck stable. Ruled out for MI with serial negative troponins, Will monitor orthostatics, PT eval, increase activity as tolerated.    2. History of chronic diastolic CHF, CAD, intermittent atrial fibrillation, Mild to moderate Aortic stenosis - did go into atrial fibrillation with RVR on 12/25/2012 night was placed on Cardizem drip, currently heart rate bedside is 84, will titrate to oral Cardizem, cardiology is following, poor candidate for anticoagulation due to history of falls and poor balance, diuresis held due to hypotension, will monitor clinically. Is on Plavix for his CAD.    3. History of COPD and OSA - stable, when necessary nebulizer treatments and oxygen as needed. CPAP at night if he uses.    4. Chronic kidney disease stage IV baseline creatinine anywhere between 1.8-2. Stable.    5. Chronic anemia stable no acute issues.    6. History of hypothyroidism. Continue home dose Synthroid, TSH is mildly low, repeat TSH along with free T4.  Lab Results  Component Value Date   TSH 0.333* 12/24/2012     7. Diabetes mellitus type 2. Continue him on sliding scale insulin, last A1c is 5.9. Glucose control is  stable.  CBG (last 3)   Basename 12/26/12 0548 12/25/12 2152 12/25/12 1643  GLUCAP 93 128* 120*        Code Status: Full  Family Communication: discussed with the patient  Disposition Plan: To be decided   Procedures CT brain and C-spine, recent echo gram   Consults neurology Dr. Antoine Poche   DVT Prophylaxis  Lovenox   Lab Results  Component Value Date   PLT 234 12/25/2012    Medications  Scheduled Meds:    . albuterol  2.5 mg Nebulization BID  . cholecalciferol  1,000 Units Oral Daily  . clopidogrel  75 mg Oral Daily  . darifenacin  7.5 mg Oral Daily  . docusate sodium  100 mg Oral BID  . enoxaparin (LOVENOX) injection  40 mg Subcutaneous Q24H  . folic acid  1 mg Oral Daily  . gabapentin  300 mg Oral QHS  . insulin aspart  0-15 Units Subcutaneous TID WC  . levothyroxine  137 mcg Oral QAC breakfast  . senna  1 tablet Oral BID  . simvastatin  20 mg Oral q1800  . sodium chloride  3 mL Intravenous Q12H  .  traZODone  100 mg Oral QHS   Continuous Infusions:    . diltiazem (CARDIZEM) infusion 5 mg/hr (12/25/12 2241)   PRN Meds:.sodium chloride, acetaminophen, albuterol, ipratropium, ondansetron (ZOFRAN) IV  Antibiotics    Anti-infectives     Start     Dose/Rate Route Frequency Ordered Stop   12/25/12 0900   azithromycin (ZITHROMAX) tablet 250 mg  Status:  Discontinued        250 mg Oral Daily 12/25/12 0828 12/25/12 1057   12/25/12 0900   cefTRIAXone (ROCEPHIN) 1 g in dextrose 5 % 50 mL IVPB  Status:  Discontinued        1 g 100 mL/hr over 30 Minutes Intravenous Every 24 hours 12/25/12 0828 12/25/12 1057   12/24/12 1345   azithromycin (ZITHROMAX) 500 mg in dextrose 5 % 250 mL IVPB        500 mg 250 mL/hr over 60 Minutes Intravenous  Once 12/24/12 1342 12/24/12 1545   12/24/12 1345   cefTRIAXone (ROCEPHIN) 1 g in dextrose 5 % 50 mL IVPB        1 g 100 mL/hr over 30 Minutes Intravenous  Once 12/24/12 1342 12/24/12 1425           Time Spent in  minutes   35   Dianara Smullen K M.D on 12/26/2012 at 10:22 AM  Between 7am to 7pm - Pager - [phone]  After 7pm go to [email or web address] - password [email or web address]  And look for the night coverage person covering for me after hours  [facility] Office  [phone]    Subjective:   [name] today has, No headache, No chest pain, No abdominal pain - No Nausea, No new weakness tingling or numbness, No Cough - SOB. Feels weak all over.  Objective:   Filed Vitals:   12/26/12 0824 12/26/12 0828 12/26/12 0831 12/26/12 0838  BP: 101/61 68/48 87/53  164/92  Pulse: 76 102 92 111  Temp:      TempSrc:      Resp:      Height:      Weight:      SpO2:        Wt Readings from Last 3 Encounters:  12/26/12 94.5 kg (208 lb 5.4 oz)  12/05/12 91.74 kg (202 lb 4 oz)  11/27/12 94.348 kg (208 lb)     Intake/Output Summary (Last 24 hours) at 12/26/12 1022 Last data filed at 12/26/12 0752  Gross per 24 hour  Intake   1200 ml  Output   2500 ml  Net  -1300 ml    Exam Awake Alert, Oriented X 3, No new F.N deficits, Normal affect [place].AT,PERRAL Supple Neck,No JVD, No cervical lymphadenopathy appriciated.  Symmetrical Chest wall movement, Good air movement bilaterally, CTAB RRR,No Gallops,Rubs or new Murmurs, No Parasternal Heave +ve B.Sounds, Abd Soft, Non tender, No organomegaly appriciated, No rebound - guarding or rigidity. No Cyanosis, Clubbing or edema, No new Rash or bruise      Data Review   Micro Results Recent Results (from the past 240 hour(s))  URINE CULTURE     Status: Normal   Collection Time   12/24/12 12:08 PM      Component Value Range Status Comment   Specimen Description URINE, CATHETERIZED   Final    Special Requests NONE   Final    Culture  Setup Time 12/24/2012 12:31   Final    Colony Count NO GROWTH   Final    Culture NO GROWTH   Final  Report Status 12/25/2012 FINAL   Final   CULTURE, BLOOD (ROUTINE X 2)     Status: Normal (Preliminary result)    Collection Time   12/24/12  3:00 PM      Component Value Range Status Comment   Specimen Description BLOOD ARM RIGHT   Final    Special Requests BOTTLES DRAWN AEROBIC AND ANAEROBIC 10CC   Final    Culture  Setup Time 12/24/2012 21:20   Final    Culture     Final    Value:        BLOOD CULTURE RECEIVED NO GROWTH TO DATE CULTURE WILL BE HELD FOR 5 DAYS BEFORE ISSUING A FINAL NEGATIVE REPORT   Report Status PENDING   Incomplete     Radiology Reports    Ct Head Wo Contrast  12/24/2012  *RADIOLOGY REPORT*  Clinical Data:  Fall. Head and neck injury with loss of consciousness.  CT HEAD WITHOUT CONTRAST CT CERVICAL SPINE WITHOUT CONTRAST  Technique:  Multidetector CT imaging of the head and cervical spine was performed following the standard protocol without intravenous contrast.  Multiplanar CT image reconstructions of the cervical spine were also generated.  Comparison:  Head CT on 11/18/2012  CT HEAD  Findings: There is no evidence of intracranial hemorrhage, brain edema or other signs of acute infarction.  There is no evidence of intracranial mass lesion or mass effect.  No abnormal extra-axial fluid collections are identified.  Moderate cerebral atrophy is stable.  Ventricles are stable in size.  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  No skull fracture or other significant bone abnormality identified.  IMPRESSION:  1.  No acute intracranial findings. 2.  Stable cerebral atrophy and old right basal ganglia lacune.  CT CERVICAL SPINE  Findings: No evidence of acute cervical spine fracture.  Severe degenerative disc disease is seen at levels of C5-6, C6-7, and C7- T1.  Severe facet DJD and ankylosis seen bilaterally at C3-4 and C4- 5.  Facet DJD also seen at other cervical levels, and atlantoaxial degenerative changes are also noted.  Grade 1 anterolisthesis noted at C4-5 measuring approximately 3 mm, which is attributable to the facet DJD seen at this level.   IMPRESSION:  1.  No evidence of acute cervical spine fracture. 2.  Advanced degenerative spondylosis, with grade 1 anterolisthesis at C4-5.   Original Report Authenticated By: Myles Rosenthal, M.D.    Ct Cervical Spine Wo Contrast  12/24/2012  *RADIOLOGY REPORT*  Clinical Data:  Fall. Head and neck injury with loss of consciousness.  CT HEAD WITHOUT CONTRAST CT CERVICAL SPINE WITHOUT CONTRAST  Technique:  Multidetector CT imaging of the head and cervical spine was performed following the standard protocol without intravenous contrast.  Multiplanar CT image reconstructions of the cervical spine were also generated.  Comparison:  Head CT on 11/18/2012  CT HEAD  Findings: There is no evidence of intracranial hemorrhage, brain edema or other signs of acute infarction.  There is no evidence of intracranial mass lesion or mass effect.  No abnormal extra-axial fluid collections are identified.  Moderate cerebral atrophy is stable.  Ventricles are stable in size.  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  No skull fracture or other significant bone abnormality identified.  IMPRESSION:  1.  No acute intracranial findings. 2.  Stable cerebral atrophy and old right basal ganglia lacune.  CT CERVICAL SPINE  Findings: No evidence of acute cervical spine fracture.  Severe degenerative  disc disease is seen at levels of C5-6, C6-7, and C7- T1.  Severe facet DJD and ankylosis seen bilaterally at C3-4 and C4- 5.  Facet DJD also seen at other cervical levels, and atlantoaxial degenerative changes are also noted.  Grade 1 anterolisthesis noted at C4-5 measuring approximately 3 mm, which is attributable to the facet DJD seen at this level.  IMPRESSION:  1.  No evidence of acute cervical spine fracture. 2.  Advanced degenerative spondylosis, with grade 1 anterolisthesis at C4-5.   Original Report Authenticated By: Myles Rosenthal, M.D.    Dg Chest Port 1 View  12/25/2012  *RADIOLOGY REPORT*  Clinical Data:  Shortness of breath and cough.  Evaluate pleural effusion.  PORTABLE CHEST - 1 VIEW  Comparison: 1 day prior  Findings: Cardiomegaly accentuated by AP portable technique. Slight increase in a layering small to moderate right pleural effusion.  A small left pleural effusion is similar. No pneumothorax.  Underlying asymmetric interstitial edema is greater on the right than left and not significantly changed.  Right greater left bibasilar airspace disease is unchanged.  IMPRESSION:  1. Minimal worsening in small to moderate right pleural effusion. 2.  Otherwise, similar appearance of the chest with asymmetric interstitial edema and bibasilar atelectasis or infection.   Original Report Authenticated By: Jeronimo Greaves, M.D.    Dg Chest Port 1 View  12/24/2012  *RADIOLOGY REPORT*  Clinical Data: Larey Seat.  Weakness.  PORTABLE CHEST - 1 VIEW  Comparison: 12/05/2012.  Findings: The cardiac silhouette, mediastinal and hilar contours are stable.  There is an enlarging right pleural effusion with overlying atelectasis or infiltrate.  Persistent left basilar atelectasis or infiltrate.  No definite pneumothorax.  IMPRESSION: Worsening lung aeration with increasing right pleural effusion and increasing bibasilar atelectasis or infiltrates.   Original Report Authenticated By: Rudie Meyer, M.D.     CBC  Lab 12/25/12 0641 12/24/12 1131  WBC 6.4 8.7  HGB 10.5* 12.7*  HCT 32.7* 39.2  PLT 234 264  MCV 104.1* 103.4*  MCH 33.4 33.5  MCHC 32.1 32.4  RDW 14.8 14.5  LYMPHSABS -- 1.5  MONOABS -- 0.7  EOSABS -- 0.2  BASOSABS -- 0.0  BANDABS -- --    Chemistries   Lab 12/25/12 0641 12/24/12 1131  NA 140 139  K 3.7 3.7  CL 104 103  CO2 28 26  GLUCOSE 96 109*  BUN 15 15  CREATININE 1.52* 1.62*  CALCIUM 8.4 8.0*  MG 1.9 --  AST -- --  ALT -- --  ALKPHOS -- --  BILITOT -- --   ------------------------------------------------------------------------------------------------------------------ estimated creatinine  clearance is 38.2 ml/min (by C-G formula based on Cr of 1.52). ------------------------------------------------------------------------------------------------------------------  [facility] 12/24/12 1731  HGBA1C 5.9*   ------------------------------------------------------------------------------------------------------------------ No results found for this basename: CHOL:2,HDL:2,LDLCALC:2,TRIG:2,CHOLHDL:2,LDLDIRECT:2 in the last 72 hours ------------------------------------------------------------------------------------------------------------------  [facility] 12/24/12 1731  TSH 0.333*  T4TOTAL --  T3FREE --  THYROIDAB --   ------------------------------------------------------------------------------------------------------------------ No results found for this basename: VITAMINB12:2,FOLATE:2,FERRITIN:2,TIBC:2,IRON:2,RETICCTPCT:2 in the last 72 hours  Coagulation profile No results found for this basename: INR:5,PROTIME:5 in the last 168 hours  No results found for this basename: DDIMER:2 in the last 72 hours  Cardiac Enzymes  Lab 12/24/12 2330 12/24/12 1731 12/24/12 1131  CKMB -- -- --  TROPONINI <0.30 <0.30 <0.30  MYOGLOBIN -- -- --   ------------------------------------------------------------------------------------------------------------------ No components found with this basename: POCBNP:3

## 2012-12-26 NOTE — Progress Notes (Addendum)
Cardiology Progress Note Patient Name: [name] Date of Encounter: 12/26/2012, 6:16 AM     Subjective  Patient converted to a.fib with rates 120-140s yesterday evening. Placed on cardizem drip. BP soft. Patient denies chest pain or sob. C/o wheezing. Still on supplemental O2 via [place]. Has not ambulated.   Objective   Telemetry: Atrial Fibrillation 70-80s with some bursts in the 120-140s overnight   Medications: . albuterol  2.5 mg Nebulization BID  . cholecalciferol  1,000 Units Oral Daily  . clopidogrel  75 mg Oral Daily  . darifenacin  7.5 mg Oral Daily  . docusate sodium  100 mg Oral BID  . enoxaparin (LOVENOX) injection  40 mg Subcutaneous Q24H  . folic acid  1 mg Oral Daily  . gabapentin  300 mg Oral QHS  . insulin aspart  0-15 Units Subcutaneous TID WC  . levothyroxine  137 mcg Oral QAC breakfast  . senna  1 tablet Oral BID  . simvastatin  20 mg Oral q1800  . sodium chloride  3 mL Intravenous Q12H  . traZODone  100 mg Oral QHS   . diltiazem (CARDIZEM) infusion 5 mg/hr (12/25/12 2241)    Physical Exam: Temp:  [97.2 F (36.2 C)-100 F (37.8 C)] 97.2 F (36.2 C) (01/02 0415) Pulse Rate:  [78-121] 78  (01/02 0415) Resp:  [18] 18  (01/02 0415) BP: (93-117)/(58-92) 106/71 mmHg (01/02 0415) SpO2:  [92 %-97 %] 95 % (01/02 0415) Weight:  [208 lb 5.4 oz (94.5 kg)] 208 lb 5.4 oz (94.5 kg) (01/02 0415)  General: Overweight elderly male, in no acute distress. Head: Normocephalic, atraumatic, sclera non-icteric, nares are without discharge.  Neck: Supple. No JVD Lungs: Scattered expiratory wheezes. Fine bibasilar rales. Breathing is unlabored on supplemental O2. Heart: Irregularly irregular S1 S2, 2/6 systolic murmur RUSB. No rubs or gallops.  Abdomen: Soft, non-tender, non-distended with normoactive bowel sounds. No rebound/guarding. No obvious abdominal masses. Msk:  Strength and tone appear normal for age. Extremities: Trace BLE edema. Dry dressing to right  ankle/shin. No clubbing or cyanosis. Distal pedal pulses are intact and equal bilaterally. Neuro: Alert and oriented X 3. Moves all extremities spontaneously. Psych:  Responds to questions appropriately with a normal affect.   Intake/Output Summary (Last 24 hours) at 12/26/12 0616 Last data filed at 12/25/12 1857  Gross per 24 hour  Intake   1440 ml  Output   1400 ml  Net     40 ml    Labs:  [facility] 12/25/12 0641 12/24/12 1131  NA 140 139  K 3.7 3.7  CL 104 103  CO2 28 26  GLUCOSE 96 109*  BUN 15 15  CREATININE 1.52* 1.62*  CALCIUM 8.4 8.0*  MG 1.9 --  PHOS 3.8 --   Basename 12/25/12 0641 12/24/12 1131  WBC 6.4 8.7  NEUTROABS -- 6.2  HGB 10.5* 12.7*  HCT 32.7* 39.2  MCV 104.1* 103.4*  PLT 234 264   Basename 12/24/12 2330 12/24/12 1731 12/24/12 1131  TROPONINI <0.30 <0.30 <0.30   Basename 12/24/12 1731  HGBA1C 5.9*   Basename 12/24/12 1731  TSH 0.333*   Radiology/Studies:   12/24/2012 - CT HEAD WITHOUT CONTRAST CT CERVICAL SPINE WITHOUT CONTRAST   CT HEAD  Findings: There is no evidence of intracranial hemorrhage, brain edema or other signs of acute infarction.  There is no evidence of intracranial mass lesion or mass effect.  No abnormal extra-axial fluid collections are identified.  Moderate cerebral atrophy is  stable.  Ventricles are stable in size.  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  No skull fracture or other significant bone abnormality identified.  IMPRESSION:  1.  No acute intracranial findings. 2.  Stable cerebral atrophy and old right basal ganglia lacune.   CT CERVICAL SPINE  Findings: No evidence of acute cervical spine fracture.  Severe degenerative disc disease is seen at levels of C5-6, C6-7, and C7- T1.  Severe facet DJD and ankylosis seen bilaterally at C3-4 and C4- 5.  Facet DJD also seen at other cervical levels, and atlantoaxial degenerative changes are also noted.  Grade 1 anterolisthesis noted at C4-5  measuring approximately 3 mm, which is attributable to the facet DJD seen at this level.  IMPRESSION:  1.  No evidence of acute cervical spine fracture. 2.  Advanced degenerative spondylosis, with grade 1 anterolisthesis at C4-5.     12/25/2012 - PORTABLE CHEST - 1 VIEW Findings: Cardiomegaly accentuated by AP portable technique. Slight increase in a layering small to moderate right pleural effusion.  A small left pleural effusion is similar. No pneumothorax.  Underlying asymmetric interstitial edema is greater on the right than left and not significantly changed.  Right greater left bibasilar airspace disease is unchanged.  IMPRESSION:  1. Minimal worsening in small to moderate right pleural effusion. 2.  Otherwise, similar appearance of the chest with asymmetric interstitial edema and bibasilar atelectasis or infection.      Assessment and Plan   1. Syncope: Felt related to orthostasis. Hydralazine and diuretics discontinued. BP still soft. Check orthostatics when stable on feet. 2. Atrial Fibrillation w/ rvr: Patient w/ h/o PAF. Converted back to a.fib yesterday evening with rates in the 120-140s and placed on cardizem drip. Currently rate controlled. Soft BP. No anticoagulation due to h/o bleeding problems and falls. Consider transition to oral cardizem. 3. CAD: No anginal symptoms. Cardiac enzymes normal x3. Negative nuclear stress 08/2012 w/ EF 62%. No further inpatient cardiac work up.  4. Chronic Diastolic CHF: No overt heart failure symptoms. Weight stable. Continue to hold diuretics for now with soft BP. 5. Macrocytic Anemia: Hgb 12.7-->10.5 yesterday. Will recheck this am. Consider anemia panel and/or guaiac if appropriate.  Signed, HOPE, JESSICA PA-C  History and all data above reviewed.  Patient examined.  I agree with the findings as above.  The patient exam reveals COR:RRR  ,  Lungs: Decreased breath sounds  ,  Abd: Positive bowel sounds, no rebound no guarding, Ext No edema  .  All  available labs, radiology testing, previous records reviewed. Agree with documented assessment and plan. Now back in NSR and transitioned to PO Cardizem.  I will likely resume low dose Lasix in the next day or so as he did have some increased edema on CXR.  He thinks however that he is breathing better than on admission.  Continue to follow on telemetry.    [name]  6:23 PM  12/26/2012

## 2012-12-26 NOTE — Evaluation (Signed)
Physical Therapy Evaluation Patient Details Name: [name] MRN: [id number] DOB: [birth year] Today's Date: 12/26/2012 Time:  -     PT Assessment / Plan / Recommendation Clinical Impression  Patient is an 79 y.o. male admitted s/p fall and syncope.  Does demonstrate orthostatic BP, though denies symptoms with mobility today.  Feel he will benefit from skilled PT in the acute setting to address generalized weakness, decreased balance with h/o recent falls, poor activity tolerance and limited safety awareness,.  Has 24 hour assist at home and will benefit from HHPT at discharge.    PT Assessment  Patient needs continued PT services    Follow Up Recommendations  Home health PT;Supervision/Assistance - 24 hour    Does the patient have the potential to tolerate intense rehabilitation      Barriers to Discharge        Equipment Recommendations       Recommendations for Other Services     Frequency Min 3X/week    Precautions / Restrictions Precautions Precautions: Fall Precaution Comments: 2-3 times in 6 months   Pertinent Vitals/Pain Left knee pain 5/10 with activity (aggravated by weather changes;) Orthostatics as follows Supine: 107/63 Sitting: 92/61 Standing: 89/54 Standing 2 mintues: 108/55 SpO2 supine 87% on 1.5 L O2 (after cues for breathing 93%; after ambulating room air 95%)      Mobility  Bed Mobility Bed Mobility: Supine to Sit;Sit to Supine Supine to Sit: 4: Min assist Sit to Supine: 4: Min guard Details for Bed Mobility Assistance: cues for technique Transfers Transfers: Sit to Stand;Stand to Sit Sit to Stand: 4: Min assist Stand to Sit: 4: Min guard Details for Transfer Assistance: assist for safety due to orthostasis Ambulation/Gait Ambulation/Gait Assistance: 4: Min assist Ambulation Distance (Feet): 50 Feet Assistive device: Rolling walker Ambulation/Gait Assistance Details: antalgic on left, decreased step length, trunk flexed              PT  Diagnosis: Abnormality of gait;Difficulty walking;Generalized weakness  PT Problem List: Decreased strength;Decreased activity tolerance;Decreased safety awareness;Decreased balance;Decreased mobility;Pain PT Treatment Interventions: DME instruction;Gait training;Patient/family education;Functional mobility training;Therapeutic activities;Therapeutic exercise;Balance training;Neuromuscular re-education   PT Goals Acute Rehab PT Goals PT Goal Formulation: With patient Time For Goal Achievement: 01/09/13 Potential to Achieve Goals: Good Pt will go Supine/Side to Sit: with modified independence PT Goal: Supine/Side to Sit - Progress: Goal set today Pt will go Sit to Supine/Side: with modified independence PT Goal: Sit to Supine/Side - Progress: Goal set today Pt will go Sit to Stand: with modified independence PT Goal: Sit to Stand - Progress: Goal set today Pt will go Stand to Sit: with modified independence PT Goal: Stand to Sit - Progress: Goal set today Pt will Stand: with modified independence;3 - 5 min;with unilateral upper extremity support;Other (comment) (during functional activity) PT Goal: Stand - Progress: Goal set today Pt will Ambulate: with modified independence;with rolling walker;51 - 150 feet PT Goal: Ambulate - Progress: Goal set today  Visit Information  Last PT Received On: 12/26/12    Subjective Data  Subjective: Feel like a dog Patient Stated Goal: To return home   Prior Functioning  Home Living Lives With: Spouse Available Help at Discharge: Family;Available 24 hours/day (wife with dementia and second cousin stays & cares for both) Type of Home: House Home Access: Ramped entrance Home Layout: One level Bathroom Shower/Tub: Health visitor: Standard Home Adaptive Equipment: Shower chair with back;Walker - rolling;Grab bars in shower;Grab bars around toilet Prior Function Level of  Independence: Independent with assistive device(s) Driving:  No Communication Communication: No difficulties    Cognition  Overall Cognitive Status: Appears within functional limits for tasks assessed/performed Arousal/Alertness: Awake/alert Orientation Level: Appears intact for tasks assessed Behavior During Session: [facility] for tasks performed    Extremity/Trunk Assessment Right Lower Extremity Assessment RLE ROM/Strength/Tone: Deficits RLE ROM/Strength/Tone Deficits: AROM WFL, strength grossly 4/5 Left Lower Extremity Assessment LLE ROM/Strength/Tone: Deficits LLE ROM/Strength/Tone Deficits: AROM WFL with pain left knee; strength grossly 4/5   Balance    End of Session PT - End of Session Equipment Utilized During Treatment: Gait belt Activity Tolerance: Patient limited by pain Patient left: in bed;with call bell/phone within reach  GP     [facility] 12/26/2012, 4:05 PM [place], [place] [phone] 12/26/2012

## 2012-12-26 NOTE — Telephone Encounter (Signed)
Physical therapist , Olegario Messier, with [facility] notified of Dr. Jonny Ruiz response of no need for physical therapy at this time.

## 2012-12-27 LAB — GLUCOSE, CAPILLARY
Glucose-Capillary: 95 mg/dL (ref 70–99)
Glucose-Capillary: 133 mg/dL — ABNORMAL HIGH (ref 70–99)
Glucose-Capillary: 118 mg/dL — ABNORMAL HIGH (ref 70–99)
Glucose-Capillary: 144 mg/dL — ABNORMAL HIGH (ref 70–99)

## 2012-12-27 MED ORDER — CYANOCOBALAMIN 1000 MCG/ML IJ SOLN
1000.0000 ug | Freq: Once | INTRAMUSCULAR | Status: AC
  Administered 2012-12-27: 1000 ug via INTRAMUSCULAR
  Filled 2012-12-27: qty 1

## 2012-12-27 MED ORDER — DILTIAZEM HCL ER COATED BEADS 120 MG PO CP24
120.0000 mg | ORAL_CAPSULE | Freq: Every day | ORAL | Status: DC
  Administered 2012-12-27 – 2012-12-28 (×2): 120 mg via ORAL
  Filled 2012-12-27 (×2): qty 1

## 2012-12-27 MED ORDER — FUROSEMIDE 20 MG PO TABS
20.0000 mg | ORAL_TABLET | Freq: Every day | ORAL | Status: DC
  Administered 2012-12-27 – 2012-12-30 (×4): 20 mg via ORAL
  Filled 2012-12-27 (×4): qty 1

## 2012-12-27 NOTE — Consult Note (Signed)
WOC consult Note Reason for Consult: eval. POA wound to the right lateral leg, pt reports trauma to this leg at home about 8 wks ago.  He has HHRN that is doing wet -to-dry dressings every other day.   Wound type:healing skin tear Measurement:0.5cm x 0.5cm x 0.2cm  Wound ZOX:WRUE and moist, almost completely re-epithelialized Drainage (amount, consistency, odor) serous, minimal Periwound:intact with skin consistent with healed area Dressing procedure/placement/frequency: silicone foam, less trauma to wound bed, change M/W/F.  Will protect and insulate allow for finish of re-epithelialization.  Re consult if needed, will not follow at this time. Thanks  [facility] [employer], CWOCN [phone])

## 2012-12-27 NOTE — Progress Notes (Signed)
Physical Therapy Treatment Patient Details Name: [name] MRN: [id number] DOB: November 23, 1923 Today's Date: 12/27/2012 Time: 4696-2952 PT Time Calculation (min): 31 min  PT Assessment / Plan / Recommendation Comments on Treatment Session  pt rpesents with Orthostaics and hx falls.  pt's BP sitting 105/66 and standing 92/51.  pt's O2 sats 88-89% on RA at rest and 90% during mobility.  pt notes feeling very weak, however denies dizziness.  RN aware of vitals.  ? if pt will be safe enough to D/C to home as wife has Alzheimer's and is unable to provide A for pt.  May benefit from ST-SNF at D/C.      Follow Up Recommendations  SNF     Does the patient have the potential to tolerate intense rehabilitation     Barriers to Discharge        Equipment Recommendations  Rolling walker with 5" wheels    Recommendations for Other Services OT consult  Frequency Min 3X/week   Plan Discharge plan needs to be updated;Frequency remains appropriate    Precautions / Restrictions Precautions Precautions: Fall Restrictions Weight Bearing Restrictions: No   Pertinent Vitals/Pain Denies pain.    Mobility  Bed Mobility Bed Mobility: Not assessed Transfers Transfers: Sit to Stand;Stand to Sit Sit to Stand: 4: Min assist;With upper extremity assist;From chair/3-in-1;With armrests Stand to Sit: 4: Min guard;With upper extremity assist;To chair/3-in-1;With armrests Details for Transfer Assistance: cues for UE use.  Repeated transfer x3 for strengthening and to take BP as pt unable to maintain standing long enough first time to get BP.   Ambulation/Gait Ambulation/Gait Assistance: 4: Min guard Ambulation Distance (Feet): 16 Feet Assistive device: Rolling walker Ambulation/Gait Assistance Details: Cues to stay closer to RW, upright posture, encouragement.   Gait Pattern: Step-through pattern;Decreased stride length;Trunk flexed Stairs: No Wheelchair Mobility Wheelchair Mobility: No    Exercises       PT Diagnosis:    PT Problem List:   PT Treatment Interventions:     PT Goals Acute Rehab PT Goals Time For Goal Achievement: 01/09/13 Potential to Achieve Goals: Good PT Goal: Sit to Stand - Progress: Progressing toward goal PT Goal: Stand to Sit - Progress: Progressing toward goal PT Goal: Stand - Progress: Progressing toward goal PT Goal: Ambulate - Progress: Progressing toward goal  Visit Information  Last PT Received On: 12/27/12 Assistance Needed: +1    Subjective Data  Subjective: I just don't have the strength in my legs!     Cognition  Overall Cognitive Status: Appears within functional limits for tasks assessed/performed Arousal/Alertness: Awake/alert Orientation Level: Appears intact for tasks assessed Behavior During Session: [facility] for tasks performed    Balance  Balance Balance Assessed: No  End of Session PT - End of Session Equipment Utilized During Treatment: Gait belt Activity Tolerance: Patient limited by fatigue Patient left: in chair;with call bell/phone within reach;with nursing in room Nurse Communication: Mobility status   GP     Sunny Schlein, [place] [phone] 12/27/2012, 10:37 AM

## 2012-12-27 NOTE — Progress Notes (Signed)
Subjective:  The patient denies any chest pain or shortness of breath this morning.  He denies any dizziness or lightheadedness.  He complains of weakness in his legs when he tries to walk. Since yesterday he has once again converted out of atrial fibrillation back to normal sinus rhythm and is off IV Cardizem. His weight is down 7 pounds since admission. His blood pressure which was low yesterday is now back to normal. Objective:  Vital Signs in the last 24 hours: Temp:  [97.5 F (36.4 C)-97.9 F (36.6 C)] 97.9 F (36.6 C) (01/03 0601) Pulse Rate:  [64-116] 70  (01/03 0601) Resp:  [18-22] 20  (01/03 0601) BP: (79-151)/(43-76) 151/74 mmHg (01/03 0601) SpO2:  [93 %-96 %] 93 % (01/03 0823) Weight:  [207 lb 11.2 oz (94.212 kg)] 207 lb 11.2 oz (94.212 kg) (01/03 0601)  Intake/Output from previous day: 01/02 0701 - 01/03 0700 In: 1203.5 [P.O.:1020; I.V.:183.5] Out: 2375 [Urine:2375] Intake/Output from this shift: Total I/O In: 240 [P.O.:240] Out: -      . albuterol  2.5 mg Nebulization BID  . atorvastatin  10 mg Oral q1800  . cholecalciferol  1,000 Units Oral Daily  . clopidogrel  75 mg Oral Daily  . darifenacin  7.5 mg Oral Daily  . diltiazem  60 mg Oral Q8H  . docusate sodium  100 mg Oral BID  . enoxaparin (LOVENOX) injection  40 mg Subcutaneous Q24H  . folic acid  1 mg Oral Daily  . gabapentin  300 mg Oral QHS  . insulin aspart  0-15 Units Subcutaneous TID WC  . levothyroxine  137 mcg Oral QAC breakfast  . senna  1 tablet Oral BID  . sodium chloride  3 mL Intravenous Q12H  . traZODone  100 mg Oral QHS      Physical Exam: The patient appears to be in no distress.  Head and neck exam reveals that the pupils are equal and reactive.  The extraocular movements are full.  There is no scleral icterus.  Mouth and pharynx are benign.  No lymphadenopathy.  No carotid bruits.  The jugular venous pressure is normal.  Thyroid is not enlarged or tender.  Chest is clear to  percussion and auscultation.  No rales or rhonchi.  Expansion of the chest is symmetrical.  Heart reveals soft systolic ejection murmur at base.  No gallop  The abdomen is soft and nontender.  Bowel sounds are normoactive.  There is no hepatosplenomegaly or mass.  There are no abdominal bruits.  Extremities reveal moderate soft 2+ pretibial edema bilaterally.  The patient states that this is chronic for him Neurologic exam is normal strength and no lateralizing weakness.  No sensory deficits.  Integument reveals no rash  Lab Results:  Basename 12/26/12 1153 12/25/12 0641  WBC 9.2 6.4  HGB 13.2 10.5*  PLT 269 234    Basename 12/25/12 0641 12/24/12 1131  NA 140 139  K 3.7 3.7  CL 104 103  CO2 28 26  GLUCOSE 96 109*  BUN 15 15  CREATININE 1.52* 1.62*    Basename 12/24/12 2330 12/24/12 1731  TROPONINI <0.30 <0.30   Hepatic Function Panel No results found for this basename: PROT,ALBUMIN,AST,ALT,ALKPHOS,BILITOT,BILIDIR,IBILI in the last 72 hours No results found for this basename: CHOL in the last 72 hours No results found for this basename: PROTIME in the last 72 hours  Imaging: Imaging results have been reviewed [facility] Chest [place] 1 View [[id number]] Resulted:12/25/12 0833 Order Status:Completed Updated:12/25/12 8413 Narrative: *RADIOLOGY REPORT*  Clinical Data: Shortness of breath and cough. Evaluate pleural effusion.  PORTABLE CHEST - 1 VIEW  Comparison: 1 day prior  Findings: Cardiomegaly accentuated by AP portable technique. Slight increase in a layering small to moderate right pleural effusion. A small left pleural effusion is similar. No pneumothorax. Underlying asymmetric interstitial edema is greater on the right than left and not significantly changed. Right greater left bibasilar airspace disease is unchanged.  IMPRESSION:  1. Minimal worsening in small to moderate right pleural effusion. 2. Otherwise, similar appearance of the chest with  asymmetric interstitial edema and bibasilar atelectasis or infection.   Original Report Authenticated By: Jeronimo Greaves, M.D.   Cardiac Studies: Telemetry shows normal sinus rhythm. Assessment/Plan:  Patient Active Hospital Problem List:  chronic diastolic congestive heart failure       Assessment: Restart Lasix at a lower dose of 20 mg once a day.recheck BMET in a.m.   AF (paroxysmal atrial fibrillation) ()   Assessment: Back in normal sinus rhythm    Plan: Will switch to once a day Cardizem CD 120 mg daily   Syncope (12/24/2012)   Assessment: Denies dizziness now    Plan: Check orthostatic blood pressures   LOS: 3 days    Cassell Clement 12/27/2012, 9:36 AM

## 2012-12-27 NOTE — Progress Notes (Signed)
Pt refuses CPAP 

## 2012-12-27 NOTE — Progress Notes (Signed)
Patient was referred to CSW for short term SNF. He had originally planned to d/c home but both patient and daughter agree that he is too weak to return home. He lives at home with his wife who has advanced dementia. She has a 24 hour live in caregiver but patient feels he needs to be stronger before he returns home.  He has been in placement before and is aware of process.  Wants placement at either [facility], [facility] and [address], or [place] of [address]. He has been a former resident of [facility]. Message left for admissions at [facility];  Spoke to admissions at [facility]- she will review his referral Monday morning; states that she does have a male vacancy. Patient and daughter Chip Boer were notified of above.  Patient wants CSW to check with [facility] on Monday morning to see if they can accept him.  CSW will monitor.  Fl2 placed on chart for MD's signature.  Lorri Frederick. [place]  [phone]

## 2012-12-27 NOTE — Progress Notes (Signed)
[facility]                                                                                Patient Demographics  [name], is a 79 y.o. male  WUJ:[id number]  NFA:[id number]  DOB - 28-Aug-1923  Admit date - 12/24/2012  Admitting Physician [name], MD  Outpatient Primary MD for the patient is [name], MD  LOS - 3   Chief Complaint  Patient presents with  . Near Syncope        Assessment & Plan    1. Syncope and fall - likely due to hypotension from possible over diuresis, patient was hypotensive on admission, diuresis was held, post gentle IV fluids, appreciate cardiology input, no further cardiac workup per cardiology, recent echo gram noted with chronic diastolic CHF EF 50-55% on 09/16/2012, chest x-ray shows chronic mild pleural effusions, CT brain and neck stable. Ruled out for MI with serial negative troponins, Will monitor orthostatics, PT eval, increase activity as tolerated. Number pressures are much stable IV fluids will be stopped and low-dose Lasix will be started.  He is feeling weak all over, PT recommended SNF, discussed with patient at length he has agreed, have consulted social work to look for placement on 12/27/2012.   2. History of chronic diastolic CHF EF 55% on 09/16/2012, CAD, intermittent atrial fibrillation, Mild to moderate Aortic stenosis - did go into atrial fibrillation with RVR on 12/25/2012 night was placed on Cardizem drip, currently heart rate bedside is 84, will titrate to oral Cardizem, cardiology is following, poor candidate for anticoagulation due to history of falls and poor balance, diuresis held due to hypotension, will monitor clinically. Is on Plavix for his CAD.    3. History of COPD and OSA - stable, when necessary nebulizer treatments and oxygen as needed. CPAP at night if he uses.    4. Chronic kidney disease stage IV baseline creatinine anywhere between 1.8-2. Stable.    5. Chronic anemia stable no acute  issues.    6. History of hypothyroidism. Continue home dose Synthroid, TSH is stable along with free T4,  Lab Results  Component Value Date   TSH 0.364 12/26/2012     7. Diabetes mellitus type 2. Continue him on sliding scale insulin, last A1c is 5.9. Glucose control is stable.  CBG (last 3)   Basename 12/27/12 0612 12/27/12 0539 12/26/12 2110  GLUCAP 95 92 158*        Code Status: Full  Family Communication: discussed with the patient  Disposition Plan: To be decided   Procedures CT brain and C-spine, recent echo gram   Consults neurology Dr. Antoine Poche   DVT Prophylaxis  Lovenox   Lab Results  Component Value Date   PLT 269 12/26/2012    Medications  Scheduled Meds:    . albuterol  2.5 mg Nebulization BID  . atorvastatin  10 mg Oral q1800  . cholecalciferol  1,000 Units Oral Daily  . clopidogrel  75 mg Oral Daily  . darifenacin  7.5 mg Oral Daily  . diltiazem  120 mg Oral Daily  . docusate sodium  100 mg Oral BID  . enoxaparin (LOVENOX) injection  40  mg Subcutaneous Q24H  . folic acid  1 mg Oral Daily  . furosemide  20 mg Oral Daily  . gabapentin  300 mg Oral QHS  . insulin aspart  0-15 Units Subcutaneous TID WC  . levothyroxine  137 mcg Oral QAC breakfast  . senna  1 tablet Oral BID  . sodium chloride  3 mL Intravenous Q12H  . traZODone  100 mg Oral QHS   Continuous Infusions:   PRN Meds:.sodium chloride, acetaminophen, albuterol, ipratropium, ondansetron (ZOFRAN) IV  Antibiotics    Anti-infectives     Start     Dose/Rate Route Frequency Ordered Stop   12/25/12 0900   azithromycin (ZITHROMAX) tablet 250 mg  Status:  Discontinued        250 mg Oral Daily 12/25/12 0828 12/25/12 1057   12/25/12 0900   cefTRIAXone (ROCEPHIN) 1 g in dextrose 5 % 50 mL IVPB  Status:  Discontinued        1 g 100 mL/hr over 30 Minutes Intravenous Every 24 hours 12/25/12 0828 12/25/12 1057   12/24/12 1345   azithromycin (ZITHROMAX) 500 mg in dextrose 5 % 250 mL  IVPB        500 mg 250 mL/hr over 60 Minutes Intravenous  Once 12/24/12 1342 12/24/12 1545   12/24/12 1345   cefTRIAXone (ROCEPHIN) 1 g in dextrose 5 % 50 mL IVPB        1 g 100 mL/hr over 30 Minutes Intravenous  Once 12/24/12 1342 12/24/12 1425           Time Spent in minutes   35   [name] K M.D on 12/27/2012 at 11:18 AM  Between 7am to 7pm - Pager - [phone]  After 7pm go to [email or web address] - password [email or web address]  And look for the night coverage person covering for me after hours  [facility] Office  [phone]    Subjective:   Asencion [name] today has, No headache, No chest pain, No abdominal pain - No Nausea, No new weakness tingling or numbness, No Cough - SOB. Feels weak all over.  Objective:   Filed Vitals:   12/27/12 0823 12/27/12 0930 12/27/12 0945 12/27/12 1020  BP:  105/66 92/51 101/56  Pulse:   110   Temp:      TempSrc:      Resp:      Height:      Weight:      SpO2: 93% 89% 90%     Wt Readings from Last 3 Encounters:  12/27/12 94.212 kg (207 lb 11.2 oz)  12/05/12 91.74 kg (202 lb 4 oz)  11/27/12 94.348 kg (208 lb)     Intake/Output Summary (Last 24 hours) at 12/27/12 1118 Last data filed at 12/27/12 0844  Gross per 24 hour  Intake 1203.5 ml  Output   1175 ml  Net   28.5 ml    Exam Awake Alert, Oriented X 3, No new F.N deficits, Normal affect [place].AT,PERRAL Supple Neck,No JVD, No cervical lymphadenopathy appriciated.  Symmetrical Chest wall movement, Good air movement bilaterally, CTAB RRR,No Gallops,Rubs or new Murmurs, No Parasternal Heave +ve B.Sounds, Abd Soft, Non tender, No organomegaly appriciated, No rebound - guarding or rigidity. No Cyanosis, Clubbing or edema, No new Rash or bruise      Data Review   Micro Results Recent Results (from the past 240 hour(s))  URINE CULTURE     Status: Normal   Collection Time   12/24/12 12:08 PM      Component Value  Range Status Comment   Specimen Description URINE,  CATHETERIZED   Final    Special Requests NONE   Final    Culture  Setup Time 12/24/2012 12:31   Final    Colony Count NO GROWTH   Final    Culture NO GROWTH   Final    Report Status 12/25/2012 FINAL   Final   CULTURE, BLOOD (ROUTINE X 2)     Status: Normal (Preliminary result)   Collection Time   12/24/12  3:00 PM      Component Value Range Status Comment   Specimen Description BLOOD ARM RIGHT   Final    Special Requests BOTTLES DRAWN AEROBIC AND ANAEROBIC 10CC   Final    Culture  Setup Time 12/24/2012 21:20   Final    Culture     Final    Value:        BLOOD CULTURE RECEIVED NO GROWTH TO DATE CULTURE WILL BE HELD FOR 5 DAYS BEFORE ISSUING A FINAL NEGATIVE REPORT   Report Status PENDING   Incomplete     Radiology Reports    Ct Head Wo Contrast  12/24/2012  *RADIOLOGY REPORT*  Clinical Data:  Fall. Head and neck injury with loss of consciousness.  CT HEAD WITHOUT CONTRAST CT CERVICAL SPINE WITHOUT CONTRAST  Technique:  Multidetector CT imaging of the head and cervical spine was performed following the standard protocol without intravenous contrast.  Multiplanar CT image reconstructions of the cervical spine were also generated.  Comparison:  Head CT on 11/18/2012  CT HEAD  Findings: There is no evidence of intracranial hemorrhage, brain edema or other signs of acute infarction.  There is no evidence of intracranial mass lesion or mass effect.  No abnormal extra-axial fluid collections are identified.  Moderate cerebral atrophy is stable.  Ventricles are stable in size.  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  No skull fracture or other significant bone abnormality identified.  IMPRESSION:  1.  No acute intracranial findings. 2.  Stable cerebral atrophy and old right basal ganglia lacune.  CT CERVICAL SPINE  Findings: No evidence of acute cervical spine fracture.  Severe degenerative disc disease is seen at levels of C5-6, C6-7, and C7- T1.  Severe facet DJD  and ankylosis seen bilaterally at C3-4 and C4- 5.  Facet DJD also seen at other cervical levels, and atlantoaxial degenerative changes are also noted.  Grade 1 anterolisthesis noted at C4-5 measuring approximately 3 mm, which is attributable to the facet DJD seen at this level.  IMPRESSION:  1.  No evidence of acute cervical spine fracture. 2.  Advanced degenerative spondylosis, with grade 1 anterolisthesis at C4-5.   Original Report Authenticated By: Myles Rosenthal, M.D.    Ct Cervical Spine Wo Contrast  12/24/2012  *RADIOLOGY REPORT*  Clinical Data:  Fall. Head and neck injury with loss of consciousness.  CT HEAD WITHOUT CONTRAST CT CERVICAL SPINE WITHOUT CONTRAST  Technique:  Multidetector CT imaging of the head and cervical spine was performed following the standard protocol without intravenous contrast.  Multiplanar CT image reconstructions of the cervical spine were also generated.  Comparison:  Head CT on 11/18/2012  CT HEAD  Findings: There is no evidence of intracranial hemorrhage, brain edema or other signs of acute infarction.  There is no evidence of intracranial mass lesion or mass effect.  No abnormal extra-axial fluid collections are identified.  Moderate cerebral atrophy is stable.  Ventricles are stable in size.  Old lacunar  infarct involving the right left lentiform nucleus is stable.  No skull fracture or other significant bone abnormality identified.  IMPRESSION:  1.  No acute intracranial findings. 2.  Stable cerebral atrophy and old right basal ganglia lacune.  CT CERVICAL SPINE  Findings: No evidence of acute cervical spine fracture.  Severe degenerative disc disease is seen at levels of C5-6, C6-7, and C7- T1.  Severe facet DJD and ankylosis seen bilaterally at C3-4 and C4- 5.  Facet DJD also seen at other cervical levels, and atlantoaxial degenerative changes are also noted.  Grade 1 anterolisthesis noted at C4-5 measuring approximately 3 mm, which is attributable to the facet DJD seen at  this level.  IMPRESSION:  1.  No evidence of acute cervical spine fracture. 2.  Advanced degenerative spondylosis, with grade 1 anterolisthesis at C4-5.   Original Report Authenticated By: Myles Rosenthal, M.D.    Dg Chest Port 1 View  12/25/2012  *RADIOLOGY REPORT*  Clinical Data: Shortness of breath and cough.  Evaluate pleural effusion.  PORTABLE CHEST - 1 VIEW  Comparison: 1 day prior  Findings: Cardiomegaly accentuated by AP portable technique. Slight increase in a layering small to moderate right pleural effusion.  A small left pleural effusion is similar. No pneumothorax.  Underlying asymmetric interstitial edema is greater on the right than left and not significantly changed.  Right greater left bibasilar airspace disease is unchanged.  IMPRESSION:  1. Minimal worsening in small to moderate right pleural effusion. 2.  Otherwise, similar appearance of the chest with asymmetric interstitial edema and bibasilar atelectasis or infection.   Original Report Authenticated By: Jeronimo Greaves, M.D.    Dg Chest Port 1 View  12/24/2012  *RADIOLOGY REPORT*  Clinical Data: Larey Seat.  Weakness.  PORTABLE CHEST - 1 VIEW  Comparison: 12/05/2012.  Findings: The cardiac silhouette, mediastinal and hilar contours are stable.  There is an enlarging right pleural effusion with overlying atelectasis or infiltrate.  Persistent left basilar atelectasis or infiltrate.  No definite pneumothorax.  IMPRESSION: Worsening lung aeration with increasing right pleural effusion and increasing bibasilar atelectasis or infiltrates.   Original Report Authenticated By: Rudie Meyer, M.D.     CBC  Lab 12/26/12 1153 12/25/12 0641 12/24/12 1131  WBC 9.2 6.4 8.7  HGB 13.2 10.5* 12.7*  HCT 41.4 32.7* 39.2  PLT 269 234 264  MCV 104.0* 104.1* 103.4*  MCH 33.2 33.4 33.5  MCHC 31.9 32.1 32.4  RDW 14.4 14.8 14.5  LYMPHSABS -- -- 1.5  MONOABS -- -- 0.7  EOSABS -- -- 0.2  BASOSABS -- -- 0.0  BANDABS -- -- --    Chemistries   Lab 12/25/12  0641 12/24/12 1131  NA 140 139  K 3.7 3.7  CL 104 103  CO2 28 26  GLUCOSE 96 109*  BUN 15 15  CREATININE 1.52* 1.62*  CALCIUM 8.4 8.0*  MG 1.9 --  AST -- --  ALT -- --  ALKPHOS -- --  BILITOT -- --   ------------------------------------------------------------------------------------------------------------------ estimated creatinine clearance is 38.1 ml/min (by C-G formula based on Cr of 1.52). ------------------------------------------------------------------------------------------------------------------  [facility] 12/24/12 1731  HGBA1C 5.9*   ------------------------------------------------------------------------------------------------------------------ No results found for this basename: CHOL:2,HDL:2,LDLCALC:2,TRIG:2,CHOLHDL:2,LDLDIRECT:2 in the last 72 hours ------------------------------------------------------------------------------------------------------------------  Basename 12/26/12 1153  TSH 0.364  T4TOTAL --  T3FREE --  THYROIDAB --   ------------------------------------------------------------------------------------------------------------------ No results found for this basename: VITAMINB12:2,FOLATE:2,FERRITIN:2,TIBC:2,IRON:2,RETICCTPCT:2 in the last 72 hours  Coagulation profile No results found for this basename: INR:5,PROTIME:5 in the last 168 hours  No results  found for this basename: DDIMER:2 in the last 72 hours  Cardiac Enzymes  Lab 12/24/12 2330 12/24/12 1731 12/24/12 1131  CKMB -- -- --  TROPONINI <0.30 <0.30 <0.30  MYOGLOBIN -- -- --   ------------------------------------------------------------------------------------------------------------------ No components found with this basename: POCBNP:3

## 2012-12-28 DIAGNOSIS — D62 Acute posthemorrhagic anemia: Secondary | ICD-10-CM

## 2012-12-28 LAB — GLUCOSE, CAPILLARY
Glucose-Capillary: 92 mg/dL (ref 70–99)
Glucose-Capillary: 124 mg/dL — ABNORMAL HIGH (ref 70–99)
Glucose-Capillary: 191 mg/dL — ABNORMAL HIGH (ref 70–99)
Glucose-Capillary: 98 mg/dL (ref 70–99)

## 2012-12-28 LAB — BASIC METABOLIC PANEL
Calcium: 8.6 mg/dL (ref 8.4–10.5)
GFR calc Af Amer: 52 mL/min — ABNORMAL LOW (ref >90)
GFR calc non Af Amer: 45 mL/min — ABNORMAL LOW (ref >90)
Glucose, Bld: 101 mg/dL — ABNORMAL HIGH (ref 70–99)
Potassium: 3.7 mEq/L (ref 3.5–5.1)
Sodium: 142 mEq/L (ref 135–145)

## 2012-12-28 MED ORDER — SALINE SPRAY 0.65 % NA SOLN
1.0000 | NASAL | Status: DC | PRN
  Administered 2012-12-28: 1 via NASAL
  Filled 2012-12-28: qty 44

## 2012-12-28 MED ORDER — DILTIAZEM HCL ER COATED BEADS 180 MG PO CP24
180.0000 mg | ORAL_CAPSULE | Freq: Every day | ORAL | Status: DC
  Administered 2012-12-29 – 2012-12-30 (×2): 180 mg via ORAL
  Filled 2012-12-28 (×2): qty 1

## 2012-12-28 MED ORDER — FLUTICASONE PROPIONATE 50 MCG/ACT NA SUSP
1.0000 | Freq: Every day | NASAL | Status: DC
  Administered 2012-12-28 – 2012-12-30 (×3): 1 via NASAL
  Filled 2012-12-28: qty 16

## 2012-12-28 NOTE — Progress Notes (Signed)
Move Pt to 1l of o2, pt stat 94-95%, Pt dienes SOB. Will continue to monitor

## 2012-12-28 NOTE — Progress Notes (Signed)
Patient ID: [name], male   DOB: 09-27-1923, 79 y.o.   MRN: [id number] Subjective:  "I slept well last night", denies chest pain or sob  Objective:  Vital Signs in the last 24 hours: Temp:  [97.8 F (36.6 C)-99.1 F (37.3 C)] 99.1 F (37.3 C) (01/04 0615) Pulse Rate:  [67-112] 112  (01/04 0928) Resp:  [18-20] 20  (01/04 0918) BP: (101-150)/(46-102) 148/102 mmHg (01/04 0928) SpO2:  [92 %-95 %] 92 % (01/04 0939) Weight:  [207 lb 6.4 oz (94.076 kg)] 207 lb 6.4 oz (94.076 kg) (01/04 0615)  Intake/Output from previous day: 01/03 0701 - 01/04 0700 In: 1156 [P.O.:1156] Out: 1800 [Urine:1800] Intake/Output from this shift:    Physical Exam: Well appearing elderly man, NAD HEENT: Unremarkable Neck:  no JVD, no thyromegally Lungs:  Clear with no wheezes HEART:  Regular rate rhythm, no murmurs, no rubs, no clicks Abd:  Flat, positive bowel sounds, no organomegally, no rebound, no guarding Ext:  2 plus pulses, no edema, no cyanosis, no clubbing Skin:  No rashes no nodules Neuro:  CN II through XII intact, motor grossly intact  Lab Results:  Basename 12/26/12 1153  WBC 9.2  HGB 13.2  PLT 269    Basename 12/28/12 0634  NA 142  K 3.7  CL 106  CO2 27  GLUCOSE 101*  BUN 16  CREATININE 1.36*   No results found for this basename: TROPONINI:2,CK,MB:2 in the last 72 hours Hepatic Function Panel No results found for this basename: PROT,ALBUMIN,AST,ALT,ALKPHOS,BILITOT,BILIDIR,IBILI in the last 72 hours No results found for this basename: CHOL in the last 72 hours No results found for this basename: PROTIME in the last 72 hours  Imaging: No results found.  Cardiac Studies: Tele - atrial fib with a controlled VR Assessment/Plan:  1. Acute on chronic CHF 2. Atrial fib with a controlled VR 3. AS 4. Obesity Rec: continue current medical therapy. He is slated to undergo rehab next week. His weights will need to be monitored carefully. Progress activity as tolerated.  LOS:  4 days    Gregg Taylor,M.D. 12/28/2012, 10:13 AM

## 2012-12-28 NOTE — Progress Notes (Signed)
[facility]                                                                                Patient Demographics  [name], is a 79 y.o. male  KGM:[id number]  UYQ:[id number]  DOB - 01/07/1923  Admit date - 12/24/2012  Admitting Physician [name], MD  Outpatient Primary MD for the patient is [name], MD  LOS - 4   Chief Complaint  Patient presents with  . Near Syncope        Assessment & Plan    1. Syncope and fall - likely due to hypotension from possible over diuresis, patient was hypotensive on admission, diuresis was held, post gentle IV fluids, appreciate cardiology input, no further cardiac workup per cardiology, recent echo gram noted with chronic diastolic CHF EF 50-55% on 09/16/2012, chest x-ray shows chronic mild pleural effusions, CT brain and neck stable. Ruled out for MI with serial negative troponins, Will monitor orthostatics, PT eval, increase activity as tolerated. Number pressures are much stable IV fluids will be stopped and low-dose Lasix will be started.  He is feeling weak all over, PT recommended SNF, discussed with patient at length he has agreed, have consulted social work to look for placement on 12/27/2012.   2. History of chronic diastolic CHF EF 55% on 09/16/2012, CAD, intermittent atrial fibrillation, Mild to moderate Aortic stenosis - did go into atrial fibrillation with RVR on 12/25/2012 night was initially placed on Cardizem drip now on oral Cardizem have titrated the dose for better rate control,cardiology is following, poor candidate for anticoagulation due to history of falls and poor balance, diuresis held due to hypotension, will monitor clinically. Is on Plavix for his CAD. Low-dose Lasix has been resumed we'll monitor BMP closely.   3. History of COPD and OSA - stable, when necessary nebulizer treatments and oxygen as needed. CPAP at night if he uses.    4. Chronic kidney disease stage IV baseline creatinine  anywhere between 1.8-2. Stable.    5. Chronic anemia stable no acute issues.    6. History of hypothyroidism. Continue home dose Synthroid, TSH is stable along with free T4,  Lab Results  Component Value Date   TSH 0.364 12/26/2012     7. Diabetes mellitus type 2. Continue him on sliding scale insulin, last A1c is 5.9. Glucose control is stable.  CBG (last 3)   Basename 12/28/12 0545 12/27/12 2108 12/27/12 1614  GLUCAP 92 144* 118*        Code Status: Full  Family Communication: discussed with the patient  Disposition Plan: To be decided   Procedures CT brain and C-spine, recent echo gram   Consults Cardiology Dr. Antoine Poche   DVT Prophylaxis  Lovenox   Lab Results  Component Value Date   PLT 269 12/26/2012    Medications  Scheduled Meds:    . albuterol  2.5 mg Nebulization BID  . atorvastatin  10 mg Oral q1800  . cholecalciferol  1,000 Units Oral Daily  . clopidogrel  75 mg Oral Daily  . darifenacin  7.5 mg Oral Daily  . diltiazem  180 mg Oral Daily  . docusate sodium  100 mg  Oral BID  . enoxaparin (LOVENOX) injection  40 mg Subcutaneous Q24H  . folic acid  1 mg Oral Daily  . furosemide  20 mg Oral Daily  . gabapentin  300 mg Oral QHS  . insulin aspart  0-15 Units Subcutaneous TID WC  . levothyroxine  137 mcg Oral QAC breakfast  . senna  1 tablet Oral BID  . sodium chloride  3 mL Intravenous Q12H  . traZODone  100 mg Oral QHS   Continuous Infusions:   PRN Meds:.acetaminophen, albuterol, ipratropium, ondansetron (ZOFRAN) IV  Antibiotics    Anti-infectives     Start     Dose/Rate Route Frequency Ordered Stop   12/25/12 0900   azithromycin (ZITHROMAX) tablet 250 mg  Status:  Discontinued        250 mg Oral Daily 12/25/12 0828 12/25/12 1057   12/25/12 0900   cefTRIAXone (ROCEPHIN) 1 g in dextrose 5 % 50 mL IVPB  Status:  Discontinued        1 g 100 mL/hr over 30 Minutes Intravenous Every 24 hours 12/25/12 0828 12/25/12 1057   12/24/12 1345    azithromycin (ZITHROMAX) 500 mg in dextrose 5 % 250 mL IVPB        500 mg 250 mL/hr over 60 Minutes Intravenous  Once 12/24/12 1342 12/24/12 1545   12/24/12 1345   cefTRIAXone (ROCEPHIN) 1 g in dextrose 5 % 50 mL IVPB        1 g 100 mL/hr over 30 Minutes Intravenous  Once 12/24/12 1342 12/24/12 1425           Time Spent in minutes   35   SINGH,PRASHANT K M.D on 12/28/2012 at 10:23 AM  Between 7am to 7pm - Pager - [phone]  After 7pm go to [email or web address] - password [email or web address]  And look for the night coverage person covering for me after hours  [facility] Office  [phone]    Subjective:   [name] today has, No headache, No chest pain, No abdominal pain - No Nausea, No new weakness tingling or numbness, No Cough - SOB. Feels weak all over.  Objective:   Filed Vitals:   12/28/12 0918 12/28/12 0925 12/28/12 0928 12/28/12 0939  BP: 150/71 143/72 148/102   Pulse: 67  112   Temp:      TempSrc:      Resp: 20     Height:      Weight:      SpO2: 95%   92%    Wt Readings from Last 3 Encounters:  12/28/12 94.076 kg (207 lb 6.4 oz)  12/05/12 91.74 kg (202 lb 4 oz)  11/27/12 94.348 kg (208 lb)     Intake/Output Summary (Last 24 hours) at 12/28/12 1023 Last data filed at 12/28/12 0607  Gross per 24 hour  Intake    916 ml  Output   1800 ml  Net   -884 ml    Exam Awake Alert, Oriented X 3, No new F.N deficits, Normal affect [place].AT,PERRAL Supple Neck,No JVD, No cervical lymphadenopathy appriciated.  Symmetrical Chest wall movement, Good air movement bilaterally, CTAB i RRR,No Gallops,Rubs or new Murmurs, No Parasternal Heave +ve B.Sounds, Abd Soft, Non tender, No organomegaly appriciated, No rebound - guarding or rigidity. No Cyanosis, Clubbing or edema, No new Rash or bruise      Data Review   Micro Results Recent Results (from the past 240 hour(s))  URINE CULTURE     Status: Normal   Collection Time  12/24/12 12:08 PM      Component Value  Range Status Comment   Specimen Description URINE, CATHETERIZED   Final    Special Requests NONE   Final    Culture  Setup Time 12/24/2012 12:31   Final    Colony Count NO GROWTH   Final    Culture NO GROWTH   Final    Report Status 12/25/2012 FINAL   Final   CULTURE, BLOOD (ROUTINE X 2)     Status: Normal (Preliminary result)   Collection Time   12/24/12  3:00 PM      Component Value Range Status Comment   Specimen Description BLOOD ARM RIGHT   Final    Special Requests BOTTLES DRAWN AEROBIC AND ANAEROBIC 10CC   Final    Culture  Setup Time 12/24/2012 21:20   Final    Culture     Final    Value:        BLOOD CULTURE RECEIVED NO GROWTH TO DATE CULTURE WILL BE HELD FOR 5 DAYS BEFORE ISSUING A FINAL NEGATIVE REPORT   Report Status PENDING   Incomplete     Radiology Reports    Ct Head Wo Contrast  12/24/2012  *RADIOLOGY REPORT*  Clinical Data:  Fall. Head and neck injury with loss of consciousness.  CT HEAD WITHOUT CONTRAST CT CERVICAL SPINE WITHOUT CONTRAST  Technique:  Multidetector CT imaging of the head and cervical spine was performed following the standard protocol without intravenous contrast.  Multiplanar CT image reconstructions of the cervical spine were also generated.  Comparison:  Head CT on 11/18/2012  CT HEAD  Findings: There is no evidence of intracranial hemorrhage, brain edema or other signs of acute infarction.  There is no evidence of intracranial mass lesion or mass effect.  No abnormal extra-axial fluid collections are identified.  Moderate cerebral atrophy is stable.  Ventricles are stable in size.  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  No skull fracture or other significant bone abnormality identified.  IMPRESSION:  1.  No acute intracranial findings. 2.  Stable cerebral atrophy and old right basal ganglia lacune.  CT CERVICAL SPINE  Findings: No evidence of acute cervical spine fracture.  Severe degenerative disc disease is seen at  levels of C5-6, C6-7, and C7- T1.  Severe facet DJD and ankylosis seen bilaterally at C3-4 and C4- 5.  Facet DJD also seen at other cervical levels, and atlantoaxial degenerative changes are also noted.  Grade 1 anterolisthesis noted at C4-5 measuring approximately 3 mm, which is attributable to the facet DJD seen at this level.  IMPRESSION:  1.  No evidence of acute cervical spine fracture. 2.  Advanced degenerative spondylosis, with grade 1 anterolisthesis at C4-5.   Original Report Authenticated By: Myles Rosenthal, M.D.    Ct Cervical Spine Wo Contrast  12/24/2012  *RADIOLOGY REPORT*  Clinical Data:  Fall. Head and neck injury with loss of consciousness.  CT HEAD WITHOUT CONTRAST CT CERVICAL SPINE WITHOUT CONTRAST  Technique:  Multidetector CT imaging of the head and cervical spine was performed following the standard protocol without intravenous contrast.  Multiplanar CT image reconstructions of the cervical spine were also generated.  Comparison:  Head CT on 11/18/2012  CT HEAD  Findings: There is no evidence of intracranial hemorrhage, brain edema or other signs of acute infarction.  There is no evidence of intracranial mass lesion or mass effect.  No abnormal extra-axial fluid collections are identified.  Moderate cerebral atrophy is  stable.  Ventricles are stable in size.  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  No skull fracture or other significant bone abnormality identified.  IMPRESSION:  1.  No acute intracranial findings. 2.  Stable cerebral atrophy and old right basal ganglia lacune.  CT CERVICAL SPINE  Findings: No evidence of acute cervical spine fracture.  Severe degenerative disc disease is seen at levels of C5-6, C6-7, and C7- T1.  Severe facet DJD and ankylosis seen bilaterally at C3-4 and C4- 5.  Facet DJD also seen at other cervical levels, and atlantoaxial degenerative changes are also noted.  Grade 1 anterolisthesis noted at C4-5 measuring approximately 3  mm, which is attributable to the facet DJD seen at this level.  IMPRESSION:  1.  No evidence of acute cervical spine fracture. 2.  Advanced degenerative spondylosis, with grade 1 anterolisthesis at C4-5.   Original Report Authenticated By: Myles Rosenthal, M.D.    Dg Chest Port 1 View  12/25/2012  *RADIOLOGY REPORT*  Clinical Data: Shortness of breath and cough.  Evaluate pleural effusion.  PORTABLE CHEST - 1 VIEW  Comparison: 1 day prior  Findings: Cardiomegaly accentuated by AP portable technique. Slight increase in a layering small to moderate right pleural effusion.  A small left pleural effusion is similar. No pneumothorax.  Underlying asymmetric interstitial edema is greater on the right than left and not significantly changed.  Right greater left bibasilar airspace disease is unchanged.  IMPRESSION:  1. Minimal worsening in small to moderate right pleural effusion. 2.  Otherwise, similar appearance of the chest with asymmetric interstitial edema and bibasilar atelectasis or infection.   Original Report Authenticated By: Jeronimo Greaves, M.D.    Dg Chest Port 1 View  12/24/2012  *RADIOLOGY REPORT*  Clinical Data: Larey Seat.  Weakness.  PORTABLE CHEST - 1 VIEW  Comparison: 12/05/2012.  Findings: The cardiac silhouette, mediastinal and hilar contours are stable.  There is an enlarging right pleural effusion with overlying atelectasis or infiltrate.  Persistent left basilar atelectasis or infiltrate.  No definite pneumothorax.  IMPRESSION: Worsening lung aeration with increasing right pleural effusion and increasing bibasilar atelectasis or infiltrates.   Original Report Authenticated By: Rudie Meyer, M.D.     CBC  Lab 12/26/12 1153 12/25/12 0641 12/24/12 1131  WBC 9.2 6.4 8.7  HGB 13.2 10.5* 12.7*  HCT 41.4 32.7* 39.2  PLT 269 234 264  MCV 104.0* 104.1* 103.4*  MCH 33.2 33.4 33.5  MCHC 31.9 32.1 32.4  RDW 14.4 14.8 14.5  LYMPHSABS -- -- 1.5  MONOABS -- -- 0.7  EOSABS -- -- 0.2  BASOSABS -- -- 0.0    BANDABS -- -- --    Chemistries   Lab 12/28/12 0634 12/25/12 0641 12/24/12 1131  NA 142 140 139  K 3.7 3.7 3.7  CL 106 104 103  CO2 27 28 26   GLUCOSE 101* 96 109*  BUN 16 15 15   CREATININE 1.36* 1.52* 1.62*  CALCIUM 8.6 8.4 8.0*  MG -- 1.9 --  AST -- -- --  ALT -- -- --  ALKPHOS -- -- --  BILITOT -- -- --   ------------------------------------------------------------------------------------------------------------------ estimated creatinine clearance is 42.6 ml/min (by C-G formula based on Cr of 1.36). ------------------------------------------------------------------------------------------------------------------ No results found for this basename: HGBA1C:2 in the last 72 hours ------------------------------------------------------------------------------------------------------------------ No results found for this basename: CHOL:2,HDL:2,LDLCALC:2,TRIG:2,CHOLHDL:2,LDLDIRECT:2 in the last 72 hours ------------------------------------------------------------------------------------------------------------------  Basename 12/26/12 1153  TSH 0.364  T4TOTAL --  T3FREE --  THYROIDAB --   ------------------------------------------------------------------------------------------------------------------ No  results found for this basename: VITAMINB12:2,FOLATE:2,FERRITIN:2,TIBC:2,IRON:2,RETICCTPCT:2 in the last 72 hours  Coagulation profile No results found for this basename: INR:5,PROTIME:5 in the last 168 hours  No results found for this basename: DDIMER:2 in the last 72 hours  Cardiac Enzymes  Lab 12/24/12 2330 12/24/12 1731 12/24/12 1131  CKMB -- -- --  TROPONINI <0.30 <0.30 <0.30  MYOGLOBIN -- -- --   ------------------------------------------------------------------------------------------------------------------ No components found with this basename: POCBNP:3

## 2012-12-28 NOTE — Progress Notes (Signed)
Pt state had nasal congestion. MD order nasal spray

## 2012-12-29 LAB — GLUCOSE, CAPILLARY
Glucose-Capillary: 104 mg/dL — ABNORMAL HIGH (ref 70–99)
Glucose-Capillary: 135 mg/dL — ABNORMAL HIGH (ref 70–99)
Glucose-Capillary: 117 mg/dL — ABNORMAL HIGH (ref 70–99)

## 2012-12-29 NOTE — Progress Notes (Signed)
[facility]                                                                                Patient Demographics  [name], is a 79 y.o. male  ZOX:[id number]  [id number]:[id number]  DOB - 1923/05/25  Admit date - 12/24/2012  Admitting Physician [name], MD  Outpatient Primary MD for the patient is [name], MD  LOS - 5   Chief Complaint  Patient presents with  . Near Syncope        Assessment & Plan    1. Syncope and fall - likely due to hypotension from possible over diuresis, patient was hypotensive on admission, diuresis was held, post gentle IV fluids, appreciate cardiology input, no further cardiac workup per cardiology, recent echo gram noted with chronic diastolic CHF EF 50-55% on 09/16/2012, chest x-ray shows chronic mild pleural effusions, CT brain and neck stable. Ruled out for MI with serial negative troponins, Will monitor orthostatics, PT eval, increase activity as tolerated. Number pressures are much stable IV fluids will be stopped and low-dose Lasix will be started.  He is feeling weak all over, PT recommended SNF, discussed with patient at length he has agreed, have consulted social work to look for placement on 12/27/2012.   2. History of chronic diastolic CHF EF 55% on 09/16/2012, CAD, intermittent atrial fibrillation, Mild to moderate Aortic stenosis - did go into atrial fibrillation with RVR on 12/25/2012 night was initially placed on Cardizem drip now on oral Cardizem have titrated the dose for better rate control, cardiology is following, poor candidate for anticoagulation due to history of falls and poor balance, Low-dose Lasix for gentle diuresis due to hypotension, will monitor clinically. Is on Plavix for his CAD, we'll monitor Weight and BMP closely.   3. History of COPD and OSA - stable, when necessary nebulizer treatments and oxygen as needed. CPAP at night if he uses.    4. Chronic kidney disease stage IV baseline creatinine  anywhere between 1.8-2. Stable.    5. Chronic anemia stable no acute issues.    6. History of hypothyroidism. Continue home dose Synthroid, TSH is stable along with free T4,  Lab Results  Component Value Date   TSH 0.364 12/26/2012     7. Diabetes mellitus type 2. Continue him on sliding scale insulin, last A1c is 5.9. Glucose control is stable.  CBG (last 3)   Basename 12/29/12 0658 12/28/12 2045 12/28/12 1606  GLUCAP 104* 98 191*        Code Status: Full  Family Communication: discussed with the patient  Disposition Plan: To be decided   Procedures CT brain and C-spine, recent echo gram   Consults Cardiology Dr. Antoine Poche   DVT Prophylaxis  Lovenox   Lab Results  Component Value Date   PLT 269 12/26/2012    Medications  Scheduled Meds:    . albuterol  2.5 mg Nebulization BID  . atorvastatin  10 mg Oral q1800  . cholecalciferol  1,000 Units Oral Daily  . clopidogrel  75 mg Oral Daily  . darifenacin  7.5 mg Oral Daily  . diltiazem  180 mg Oral Daily  . docusate sodium  100  mg Oral BID  . enoxaparin (LOVENOX) injection  40 mg Subcutaneous Q24H  . fluticasone  1 spray Each Nare Daily  . folic acid  1 mg Oral Daily  . furosemide  20 mg Oral Daily  . gabapentin  300 mg Oral QHS  . insulin aspart  0-15 Units Subcutaneous TID WC  . levothyroxine  137 mcg Oral QAC breakfast  . senna  1 tablet Oral BID  . sodium chloride  3 mL Intravenous Q12H  . traZODone  100 mg Oral QHS   Continuous Infusions:   PRN Meds:.acetaminophen, albuterol, ipratropium, ondansetron (ZOFRAN) IV, sodium chloride  Antibiotics    Anti-infectives     Start     Dose/Rate Route Frequency Ordered Stop   12/25/12 0900   azithromycin (ZITHROMAX) tablet 250 mg  Status:  Discontinued        250 mg Oral Daily 12/25/12 0828 12/25/12 1057   12/25/12 0900   cefTRIAXone (ROCEPHIN) 1 g in dextrose 5 % 50 mL IVPB  Status:  Discontinued        1 g 100 mL/hr over 30 Minutes Intravenous  Every 24 hours 12/25/12 0828 12/25/12 1057   12/24/12 1345   azithromycin (ZITHROMAX) 500 mg in dextrose 5 % 250 mL IVPB        500 mg 250 mL/hr over 60 Minutes Intravenous  Once 12/24/12 1342 12/24/12 1545   12/24/12 1345   cefTRIAXone (ROCEPHIN) 1 g in dextrose 5 % 50 mL IVPB        1 g 100 mL/hr over 30 Minutes Intravenous  Once 12/24/12 1342 12/24/12 1425           Time Spent in minutes   35   SINGH,PRASHANT K M.D on 12/29/2012 at 11:01 AM  Between 7am to 7pm - Pager - [phone]  After 7pm go to [email or web address] - password [email or web address]  And look for the night coverage person covering for me after hours  [facility] Office  [phone]    Subjective:   [name] today has, No headache, No chest pain, No abdominal pain - No Nausea, No new weakness tingling or numbness, No Cough - SOB. Feels weak all over.  Objective:   Filed Vitals:   12/29/12 0703 12/29/12 0705 12/29/12 0846 12/29/12 0958  BP: 118/63 104/71  109/57  Pulse: 82 89  81  Temp:    97.8 F (36.6 C)  TempSrc:    Oral  Resp: 20 20  18   Height:      Weight:      SpO2: 90% 92% 94% 94%    Wt Readings from Last 3 Encounters:  12/29/12 94.8 kg (208 lb 15.9 oz)  12/05/12 91.74 kg (202 lb 4 oz)  11/27/12 94.348 kg (208 lb)     Intake/Output Summary (Last 24 hours) at 12/29/12 1101 Last data filed at 12/29/12 1008  Gross per 24 hour  Intake    720 ml  Output   1500 ml  Net   -780 ml    Exam Awake Alert, Oriented X 3, No new F.N deficits, Normal affect [place].AT,PERRAL Supple Neck,No JVD, No cervical lymphadenopathy appriciated.  Symmetrical Chest wall movement, Good air movement bilaterally, CTAB i RRR,No Gallops,Rubs or new Murmurs, No Parasternal Heave +ve B.Sounds, Abd Soft, Non tender, No organomegaly appriciated, No rebound - guarding or rigidity. No Cyanosis, Clubbing or edema, No new Rash or bruise      Data Review   Micro Results Recent Results (from the past 240  hour(s))    URINE CULTURE     Status: Normal   Collection Time   12/24/12 12:08 PM      Component Value Range Status Comment   Specimen Description URINE, CATHETERIZED   Final    Special Requests NONE   Final    Culture  Setup Time 12/24/2012 12:31   Final    Colony Count NO GROWTH   Final    Culture NO GROWTH   Final    Report Status 12/25/2012 FINAL   Final   CULTURE, BLOOD (ROUTINE X 2)     Status: Normal (Preliminary result)   Collection Time   12/24/12  3:00 PM      Component Value Range Status Comment   Specimen Description BLOOD ARM RIGHT   Final    Special Requests BOTTLES DRAWN AEROBIC AND ANAEROBIC 10CC   Final    Culture  Setup Time 12/24/2012 21:20   Final    Culture     Final    Value:        BLOOD CULTURE RECEIVED NO GROWTH TO DATE CULTURE WILL BE HELD FOR 5 DAYS BEFORE ISSUING A FINAL NEGATIVE REPORT   Report Status PENDING   Incomplete     Radiology Reports    Ct Head Wo Contrast  12/24/2012  *RADIOLOGY REPORT*  Clinical Data:  Fall. Head and neck injury with loss of consciousness.  CT HEAD WITHOUT CONTRAST CT CERVICAL SPINE WITHOUT CONTRAST  Technique:  Multidetector CT imaging of the head and cervical spine was performed following the standard protocol without intravenous contrast.  Multiplanar CT image reconstructions of the cervical spine were also generated.  Comparison:  Head CT on 11/18/2012  CT HEAD  Findings: There is no evidence of intracranial hemorrhage, brain edema or other signs of acute infarction.  There is no evidence of intracranial mass lesion or mass effect.  No abnormal extra-axial fluid collections are identified.  Moderate cerebral atrophy is stable.  Ventricles are stable in size.  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  No skull fracture or other significant bone abnormality identified.  IMPRESSION:  1.  No acute intracranial findings. 2.  Stable cerebral atrophy and old right basal ganglia lacune.  CT CERVICAL SPINE   Findings: No evidence of acute cervical spine fracture.  Severe degenerative disc disease is seen at levels of C5-6, C6-7, and C7- T1.  Severe facet DJD and ankylosis seen bilaterally at C3-4 and C4- 5.  Facet DJD also seen at other cervical levels, and atlantoaxial degenerative changes are also noted.  Grade 1 anterolisthesis noted at C4-5 measuring approximately 3 mm, which is attributable to the facet DJD seen at this level.  IMPRESSION:  1.  No evidence of acute cervical spine fracture. 2.  Advanced degenerative spondylosis, with grade 1 anterolisthesis at C4-5.   Original Report Authenticated By: Myles Rosenthal, M.D.    Ct Cervical Spine Wo Contrast  12/24/2012  *RADIOLOGY REPORT*  Clinical Data:  Fall. Head and neck injury with loss of consciousness.  CT HEAD WITHOUT CONTRAST CT CERVICAL SPINE WITHOUT CONTRAST  Technique:  Multidetector CT imaging of the head and cervical spine was performed following the standard protocol without intravenous contrast.  Multiplanar CT image reconstructions of the cervical spine were also generated.  Comparison:  Head CT on 11/18/2012  CT HEAD  Findings: There is no evidence of intracranial hemorrhage, brain edema or other signs of acute infarction.  There is no evidence of intracranial mass  lesion or mass effect.  No abnormal extra-axial fluid collections are identified.  Moderate cerebral atrophy is stable.  Ventricles are stable in size.  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  No skull fracture or other significant bone abnormality identified.  IMPRESSION:  1.  No acute intracranial findings. 2.  Stable cerebral atrophy and old right basal ganglia lacune.  CT CERVICAL SPINE  Findings: No evidence of acute cervical spine fracture.  Severe degenerative disc disease is seen at levels of C5-6, C6-7, and C7- T1.  Severe facet DJD and ankylosis seen bilaterally at C3-4 and C4- 5.  Facet DJD also seen at other cervical levels, and atlantoaxial  degenerative changes are also noted.  Grade 1 anterolisthesis noted at C4-5 measuring approximately 3 mm, which is attributable to the facet DJD seen at this level.  IMPRESSION:  1.  No evidence of acute cervical spine fracture. 2.  Advanced degenerative spondylosis, with grade 1 anterolisthesis at C4-5.   Original Report Authenticated By: Myles Rosenthal, M.D.    Dg Chest Port 1 View  12/25/2012  *RADIOLOGY REPORT*  Clinical Data: Shortness of breath and cough.  Evaluate pleural effusion.  PORTABLE CHEST - 1 VIEW  Comparison: 1 day prior  Findings: Cardiomegaly accentuated by AP portable technique. Slight increase in a layering small to moderate right pleural effusion.  A small left pleural effusion is similar. No pneumothorax.  Underlying asymmetric interstitial edema is greater on the right than left and not significantly changed.  Right greater left bibasilar airspace disease is unchanged.  IMPRESSION:  1. Minimal worsening in small to moderate right pleural effusion. 2.  Otherwise, similar appearance of the chest with asymmetric interstitial edema and bibasilar atelectasis or infection.   Original Report Authenticated By: Jeronimo Greaves, M.D.    Dg Chest Port 1 View  12/24/2012  *RADIOLOGY REPORT*  Clinical Data: Larey Seat.  Weakness.  PORTABLE CHEST - 1 VIEW  Comparison: 12/05/2012.  Findings: The cardiac silhouette, mediastinal and hilar contours are stable.  There is an enlarging right pleural effusion with overlying atelectasis or infiltrate.  Persistent left basilar atelectasis or infiltrate.  No definite pneumothorax.  IMPRESSION: Worsening lung aeration with increasing right pleural effusion and increasing bibasilar atelectasis or infiltrates.   Original Report Authenticated By: Rudie Meyer, M.D.     CBC  Lab 12/26/12 1153 12/25/12 0641 12/24/12 1131  WBC 9.2 6.4 8.7  HGB 13.2 10.5* 12.7*  HCT 41.4 32.7* 39.2  PLT 269 234 264  MCV 104.0* 104.1* 103.4*  MCH 33.2 33.4 33.5  MCHC 31.9 32.1 32.4  RDW  14.4 14.8 14.5  LYMPHSABS -- -- 1.5  MONOABS -- -- 0.7  EOSABS -- -- 0.2  BASOSABS -- -- 0.0  BANDABS -- -- --    Chemistries   Lab 12/28/12 0634 12/25/12 0641 12/24/12 1131  NA 142 140 139  K 3.7 3.7 3.7  CL 106 104 103  CO2 27 28 26   GLUCOSE 101* 96 109*  BUN 16 15 15   CREATININE 1.36* 1.52* 1.62*  CALCIUM 8.6 8.4 8.0*  MG -- 1.9 --  AST -- -- --  ALT -- -- --  ALKPHOS -- -- --  BILITOT -- -- --   ------------------------------------------------------------------------------------------------------------------ estimated creatinine clearance is 42.8 ml/min (by C-G formula based on Cr of 1.36). ------------------------------------------------------------------------------------------------------------------ No results found for this basename: HGBA1C:2 in the last 72 hours ------------------------------------------------------------------------------------------------------------------ No results found for this basename: CHOL:2,HDL:2,LDLCALC:2,TRIG:2,CHOLHDL:2,LDLDIRECT:2 in the last 72 hours ------------------------------------------------------------------------------------------------------------------  [facility] 12/26/12 1153  TSH 0.364  T4TOTAL --  T3FREE --  THYROIDAB --   ------------------------------------------------------------------------------------------------------------------ No results found for this basename: VITAMINB12:2,FOLATE:2,FERRITIN:2,TIBC:2,IRON:2,RETICCTPCT:2 in the last 72 hours  Coagulation profile No results found for this basename: INR:5,PROTIME:5 in the last 168 hours  No results found for this basename: DDIMER:2 in the last 72 hours  Cardiac Enzymes  Lab 12/24/12 2330 12/24/12 1731 12/24/12 1131  CKMB -- -- --  TROPONINI <0.30 <0.30 <0.30  MYOGLOBIN -- -- --   ------------------------------------------------------------------------------------------------------------------ No components found with this basename: POCBNP:3

## 2012-12-29 NOTE — Progress Notes (Signed)
Patient ID: [name], male   DOB: [birth year], 79 y.o.   MRN: [id number]  Subjective:  "I slept well last night", denies chest pain or sob  Objective:  Vital Signs in the last 24 hours: Temp:  [97 F (36.1 C)-98.6 F (37 C)] 98.4 F (36.9 C) (01/05 0700) Pulse Rate:  [62-89] 89  (01/05 0705) Resp:  [18-20] 20  (01/05 0705) BP: (104-135)/(56-75) 104/71 mmHg (01/05 0705) SpO2:  [90 %-96 %] 94 % (01/05 0846) Weight:  [208 lb 15.9 oz (94.8 kg)] 208 lb 15.9 oz (94.8 kg) (01/05 0700)  Intake/Output from previous day: 01/04 0701 - 01/05 0700 In: 840 [P.O.:840] Out: 1675 [Urine:1675] Intake/Output from this shift: Total I/O In: 120 [P.O.:120] Out: -   Physical Exam: Well appearing elderly man, NAD HEENT: Unremarkable Neck:  no JVD, no thyromegally Lungs:  Clear with no wheezes HEART:  IRegular rate rhythm, no murmurs, no rubs, no clicks Abd:  soft, positive bowel sounds, no organomegally, no rebound, no guarding Ext:  2 plus pulses, no edema, no cyanosis, no clubbing Skin:  No rashes no nodules Neuro:  CN II through XII intact, motor grossly intact  Lab Results:  Basename 12/26/12 1153  WBC 9.2  HGB 13.2  PLT 269    Basename 12/28/12 0634  NA 142  K 3.7  CL 106  CO2 27  GLUCOSE 101*  BUN 16  CREATININE 1.36*   No results found for this basename: TROPONINI:2,CK,MB:2 in the last 72 hours Hepatic Function Panel No results found for this basename: PROT,ALBUMIN,AST,ALT,ALKPHOS,BILITOT,BILIDIR,IBILI in the last 72 hours No results found for this basename: CHOL in the last 72 hours No results found for this basename: PROTIME in the last 72 hours  Imaging: No results found.  Cardiac Studies: Tele - atrial fib with a controlled VR Assessment/Plan:  1. Acute on chronic CHF, now well compensated 2. Atrial fib with a controlled VR 3. AS 4. Obesity Rec: continue current medical therapy. He is slated to undergo rehab next week. His weights will need to be monitored  carefully. Progress activity as tolerated.  LOS: 5 days    Fayette Gasner,M.D. 12/29/2012, 9:56 AM

## 2012-12-30 DIAGNOSIS — I2510 Atherosclerotic heart disease of native coronary artery without angina pectoris: Secondary | ICD-10-CM

## 2012-12-30 LAB — CULTURE, BLOOD (ROUTINE X 2): Culture: NO GROWTH

## 2012-12-30 LAB — GLUCOSE, CAPILLARY
Glucose-Capillary: 141 mg/dL — ABNORMAL HIGH (ref 70–99)
Glucose-Capillary: 120 mg/dL — ABNORMAL HIGH (ref 70–99)

## 2012-12-30 MED ORDER — FUROSEMIDE 20 MG PO TABS: 20.0000 mg | ORAL_TABLET | Freq: Every day | ORAL | Status: DC

## 2012-12-30 MED ORDER — ALBUTEROL SULFATE (5 MG/ML) 0.5% IN NEBU
2.5000 mg | INHALATION_SOLUTION | Freq: Three times a day (TID) | RESPIRATORY_TRACT | Status: DC
Start: 2012-12-30 — End: 2012-12-30

## 2012-12-30 MED ORDER — DILTIAZEM HCL ER COATED BEADS 180 MG PO CP24: 180.0000 mg | ORAL_CAPSULE | Freq: Every day | ORAL | Status: DC

## 2012-12-30 MED ORDER — DSS 100 MG PO CAPS: 100.0000 mg | ORAL_CAPSULE | Freq: Two times a day (BID) | ORAL | Status: DC

## 2012-12-30 MED ORDER — ALBUTEROL SULFATE (5 MG/ML) 0.5% IN NEBU: 2.5000 mg | INHALATION_SOLUTION | Freq: Three times a day (TID) | RESPIRATORY_TRACT | Status: DC

## 2012-12-30 MED ORDER — IPRATROPIUM BROMIDE 0.02 % IN SOLN: 0.5000 mg | RESPIRATORY_TRACT | Status: DC | PRN

## 2012-12-30 MED ORDER — ALBUTEROL SULFATE (5 MG/ML) 0.5% IN NEBU: 2.5000 mg | INHALATION_SOLUTION | RESPIRATORY_TRACT | Status: DC | PRN

## 2012-12-30 NOTE — Progress Notes (Signed)
IV d/c'd.  Tele d/c'd.  Pt d/c'd to SNF.  Home meds and d/c instructions have been discussed with pt.  Pt denies any questions or concerns at this time.  Pt leaving unit via ambulance transport and appears in no acute distress.  Report has called to receiving nurse at facility.  Nurse denies any questions or concerns at this time as well. Nino Glow RN

## 2012-12-30 NOTE — Progress Notes (Signed)
Physical Therapy Treatment Patient Details Name: [name] MRN: [id number] DOB: 10-28-1923 Today's Date: 12/30/2012 Time: 1308-6578 PT Time Calculation (min): 25 min  PT Assessment / Plan / Recommendation Comments on Treatment Session  Pt admitted with orthostasis, CHF and falls. Pt progressing with mobility and encouraged to continue mobility and HEP throughout the day. Will continue to follow    Follow Up Recommendations        Does the patient have the potential to tolerate intense rehabilitation     Barriers to Discharge        Equipment Recommendations       Recommendations for Other Services    Frequency     Plan Discharge plan remains appropriate;Frequency remains appropriate    Precautions / Restrictions Precautions Precautions: Fall Restrictions Weight Bearing Restrictions: No   Pertinent Vitals/Pain No pain BP supine 125/68, sitting 110/62 HR 72, standing 109/62 HR 83 95% on 2L   Mobility  Bed Mobility Bed Mobility: Rolling Right;Right Sidelying to Sit Rolling Right: 5: Supervision;With rail Right Sidelying to Sit: 5: Supervision;With rails;HOB flat Details for Bed Mobility Assistance: cueing for sequence Transfers Sit to Stand: 4: Min guard;From bed Stand to Sit: 4: Min guard;To chair/3-in-1;To bed Details for Transfer Assistance: x 2 trials with cueing for hand placement and safety Ambulation/Gait Ambulation/Gait Assistance: 4: Min guard Ambulation Distance (Feet): 96 Feet Assistive device: Rolling walker Ambulation/Gait Assistance Details: cueing to step into RW, look up and extend trunk  Gait Pattern: Step-through pattern;Decreased stride length;Trunk flexed Gait velocity: decreased Stairs: No    Exercises General Exercises - Lower Extremity Long Arc Quad: AROM;Both;20 reps;Seated Hip Flexion/Marching: AROM;Both;20 reps;Seated   PT Diagnosis:    PT Problem List:   PT Treatment Interventions:     PT Goals Acute Rehab PT Goals PT Goal:  Supine/Side to Sit - Progress: Progressing toward goal PT Goal: Sit to Stand - Progress: Progressing toward goal PT Goal: Stand to Sit - Progress: Progressing toward goal PT Goal: Ambulate - Progress: Progressing toward goal  Visit Information  Last PT Received On: 12/30/12 Assistance Needed: +1    Subjective Data  Subjective: My legs just aren't strong   Cognition  Overall Cognitive Status: Appears within functional limits for tasks assessed/performed Arousal/Alertness: Awake/alert Orientation Level: Appears intact for tasks assessed Behavior During Session: [facility] for tasks performed    Balance     End of Session PT - End of Session Equipment Utilized During Treatment: Gait belt Activity Tolerance: Patient tolerated treatment well Patient left: in chair Nurse Communication: Mobility status        Delorse Lek 12/30/2012, 12:18 PM Delaney Meigs, PT [phone]

## 2012-12-30 NOTE — Discharge Summary (Signed)
[facility]                                                                                   [name], is a 79 y.o. male  DOB 1923-05-02  MRN [id number].  Admission date:  12/24/2012  Discharge Date:  12/30/2012  Primary MD  [name], MD  Admitting Physician  [name], MD  Admission Diagnosis  Atrial fibrillation [427.31] Anorexia [783.0] Syncope [780.2] AS (aortic stenosis) [424.1] PMR (polymyalgia rheumatica) [725] AF (paroxysmal atrial fibrillation) [427.31] DM (diabetes mellitus) [250.00] Community acquired pneumonia [486] Fall [E888.9] FALL, Acute CHF   Discharge Diagnosis     Active Problems:  LIPOMA  HYPOTHYROIDISM  HYPERLIPIDEMIA  HYPERKALEMIA  HYPOKALEMIA  Pernicious anemia  ANEMIA  DEPRESSIVE DISORDER NOT ELSEWHERE CLASSIFIED  SLEEP APNEA, OBSTRUCTIVE  HYPERTENSION, BENIGN  CORONARY ARTERY DISEASE  AORTIC STENOSIS/ INSUFFICIENCY, NON-RHEUMATIC  Atrial flutter  RECTAL BLEEDING  RENAL FAILURE  AS (aortic stenosis)  AF (paroxysmal atrial fibrillation)  DM (diabetes mellitus)  Left carotid stenosis  CKD (chronic kidney disease) stage 4, GFR 15-29 ml/min  COPD (chronic obstructive pulmonary disease)  Syncope   Past Medical History  Diagnosis Date  . CAD (coronary artery disease)     s/p NSTEMI and BMS stent diagonal07/09;  Lexiscan Myoview 9/13:  EF 62%, no ischemia  . AS (aortic stenosis)     a.  Echo 2009 mean gradient 9mm HG. AVA 2.18;  b.  Echo 4/11 mild AS mean gradient 10mm HG;  c.  Echo 04/2011: Mild LVH, EF 55-60%, grade 1 diastolic dysfunction, mild aortic stenosis, mean gradient 14, mild LAE;  d. Echo 9/13: mod LVH, EF 50-55%, mild to mod AS, mean gradient 17 mmHg  . AF (paroxysmal atrial fibrillation)     Holter monitor 2.5 sec pauses  . Fatigue     CPX 11/09 VO2  14.4 (75% predicte)d, slope 35.  O2 pulse normal.  VO2 corrected for body weight 17.6.  Marland Kitchen Hypothyroidism   . Hyperlipidemia   . History of prostate  cancer   . Allergic rhinitis   . DM (diabetes mellitus)     type II diet controlled  . Shingles   . Left carotid stenosis   . Vitamin B 12 deficiency   . Anemia   . Osteoarthritis   . Nephrolithiasis   . Obesity   . OSA (obstructive sleep apnea)     AHI 15 PSG 09/06/08  . Hx of colonoscopy   . Stroke   . Anxiety and depression   . PMR (polymyalgia rheumatica) 05/29/2012  . Hypogonadism male 05/29/2012  . Hyperparathyroidism 05/29/2012  . TIA (transient ischemic attack) 05/29/2012  . CKD (chronic kidney disease) stage 4, GFR 15-29 ml/min 05/29/2012  . COPD (chronic obstructive pulmonary disease) 05/29/2012  . Myocardial infarction   . Cancer     prostate ca history    Past Surgical History  Procedure Date  . Appendectomy   . Hernia repair   . Right carpal tunnel release   . Lumbar spine surgery   . Cystectomy     neck  . Prostatectomy   . Vasectomy   . Total knee arthroplasty  right  . Cholecystectomy      Recommendations for primary care physician for things to follow:   Follow patient's weight, BMP and orthostatic blood pressures closely.   Discharge Diagnoses:   Active Problems:  LIPOMA  HYPOTHYROIDISM  HYPERLIPIDEMIA  HYPERKALEMIA  HYPOKALEMIA  Pernicious anemia  ANEMIA  DEPRESSIVE DISORDER NOT ELSEWHERE CLASSIFIED  SLEEP APNEA, OBSTRUCTIVE  HYPERTENSION, BENIGN  CORONARY ARTERY DISEASE  AORTIC STENOSIS/ INSUFFICIENCY, NON-RHEUMATIC  Atrial flutter  RECTAL BLEEDING  RENAL FAILURE  AS (aortic stenosis)  AF (paroxysmal atrial fibrillation)  DM (diabetes mellitus)  Left carotid stenosis  CKD (chronic kidney disease) stage 4, GFR 15-29 ml/min  COPD (chronic obstructive pulmonary disease)  Syncope    Discharge Condition: Stable   Diet recommendation: See Discharge Instructions below   Consults cardiology   History of present illness and  Hospital Course:  See H&P, Labs, Consult and Test reports for all details in brief, patient was admitted  for syncope related to orthostatic hypotension caused by over diuresis, much better after diuretics were held and he received IV fluids, now close to clinically compensated and has been started on low-dose Lasix, he was seen here by cardiology, he has history of chronic diastolic CHF with EF of 50-55% on echogram done 09/16/2012, patient had CT brain and neck which were stable. He ruled out MI with serial negative troponins. Now doing much better and overall feels close to baseline, will benefit from ongoing physical therapy at rehabilitation.   Patient has history of intermittent atrial fibrillation and did go into atrial fibrillation while being in the hospital, he also has moderate aortic stenosis, he was seen by cardiology and placed on Cardizem, goal is rate controlled, he's not a candidate for anticoagulation. Due to poor balance and history of recurrent nose bleeds, he is on Plavix and will be continued on the same per cardiology.    History of obstructive sleep apnea and COPD. Stable continue when necessary nebulizer treatments and 1-2 L nasal cannula oxygen as tolerated in daytime and nighttime nasal cannula oxygen as was his CPAP as tolerated.   Chronic kidney disease stage IV at baseline creatinine baseline is between 1.8 and 2.   History of chronic anemia and hypothyroidism. No acute issues continue monitoring outpatient, TSH stable during this hospitalization home dose Synthroid will be continued. His A1c was 5.9.    Today   Subjective:   [name] today has no headache,no chest abdominal pain,no new weakness tingling or numbness, feels much better .  Objective:   Blood pressure 125/68, pulse 72, temperature 98.2 F (36.8 C), temperature source Axillary, resp. rate 18, height 5\' 8"  (1.727 m), weight 95.2 kg (209 lb 14.1 oz), SpO2 94.00%.   Intake/Output Summary (Last 24 hours) at 12/30/12 1040 Last data filed at 12/30/12 0500  Gross per 24 hour  Intake    100 ml    Output   1250 ml  Net  -1150 ml    Exam Awake Alert, Oriented *3, No new F.N deficits, Normal affect [place].AT,PERRAL Supple Neck,No JVD, No cervical lymphadenopathy appriciated.  Symmetrical Chest wall movement, Good air movement bilaterally, CTAB RRR,No Gallops,Rubs or new Murmurs, No Parasternal Heave +ve B.Sounds, Abd Soft, Non tender, No organomegaly appriciated, No rebound -guarding or rigidity. No Cyanosis, Clubbing or edema, No new Rash or bruise  Data Review   Major procedures and Radiology Reports - PLEASE review detailed and final reports for all details in brief -        Dg Chest  2 View  12/05/2012  *RADIOLOGY REPORT*  Clinical Data: Follow up right pneumothorax  CHEST - 2 VIEW  Comparison: 11/20/2012  Findings: The heart and pulmonary vascularity are within normal limits.  The previously seen pneumothorax on the right has resolved.  A small right-sided pleural effusion is noted with some right basilar atelectasis.  Left lung is clear.  IMPRESSION: Resolution of right pneumothorax.  Mild right basilar atelectasis with associated effusion.   Original Report Authenticated By: Alcide Clever, M.D.    Ct Head Wo Contrast  12/24/2012  *RADIOLOGY REPORT*  Clinical Data:  Fall. Head and neck injury with loss of consciousness.  CT HEAD WITHOUT CONTRAST CT CERVICAL SPINE WITHOUT CONTRAST  Technique:  Multidetector CT imaging of the head and cervical spine was performed following the standard protocol without intravenous contrast.  Multiplanar CT image reconstructions of the cervical spine were also generated.  Comparison:  Head CT on 11/18/2012  CT HEAD  Findings: There is no evidence of intracranial hemorrhage, brain edema or other signs of acute infarction.  There is no evidence of intracranial mass lesion or mass effect.  No abnormal extra-axial fluid collections are identified.  Moderate cerebral atrophy is stable.  Ventricles are stable in size.  Old lacunar infarct involving the right  left lentiform nucleus is stable.  No skull fracture or other significant bone abnormality identified.  IMPRESSION:  1.  No acute intracranial findings. 2.  Stable cerebral atrophy and old right basal ganglia lacune.  CT CERVICAL SPINE  Findings: No evidence of acute cervical spine fracture.  Severe degenerative disc disease is seen at levels of C5-6, C6-7, and C7- T1.  Severe facet DJD and ankylosis seen bilaterally at C3-4 and C4- 5.  Facet DJD also seen at other cervical levels, and atlantoaxial degenerative changes are also noted.  Grade 1 anterolisthesis noted at C4-5 measuring approximately 3 mm, which is attributable to the facet DJD seen at this level.  IMPRESSION:  1.  No evidence of acute cervical spine fracture. 2.  Advanced degenerative spondylosis, with grade 1 anterolisthesis at C4-5.   Original Report Authenticated By: Myles Rosenthal, M.D.    Ct Cervical Spine Wo Contrast  12/24/2012  *RADIOLOGY REPORT*  Clinical Data:  Fall. Head and neck injury with loss of consciousness.  CT HEAD WITHOUT CONTRAST CT CERVICAL SPINE WITHOUT CONTRAST  Technique:  Multidetector CT imaging of the head and cervical spine was performed following the standard protocol without intravenous contrast.  Multiplanar CT image reconstructions of the cervical spine were also generated.  Comparison:  Head CT on 11/18/2012  CT HEAD  Findings: There is no evidence of intracranial hemorrhage, brain edema or other signs of acute infarction.  There is no evidence of intracranial mass lesion or mass effect.  No abnormal extra-axial fluid collections are identified.  Moderate cerebral atrophy is stable.  Ventricles are stable in size.  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  No skull fracture or other significant bone abnormality identified.  IMPRESSION:  1.  No acute intracranial findings. 2.  Stable cerebral atrophy and old right basal ganglia lacune.  CT CERVICAL SPINE  Findings: No evidence of acute  cervical spine fracture.  Severe degenerative disc disease is seen at levels of C5-6, C6-7, and C7- T1.  Severe facet DJD and ankylosis seen bilaterally at C3-4 and C4- 5.  Facet DJD also seen at other cervical levels, and atlantoaxial degenerative changes are also noted.  Grade 1 anterolisthesis noted at C4-5  measuring approximately 3 mm, which is attributable to the facet DJD seen at this level.  IMPRESSION:  1.  No evidence of acute cervical spine fracture. 2.  Advanced degenerative spondylosis, with grade 1 anterolisthesis at C4-5.   Original Report Authenticated By: Myles Rosenthal, M.D.    Dg Chest Port 1 View  12/25/2012  *RADIOLOGY REPORT*  Clinical Data: Shortness of breath and cough.  Evaluate pleural effusion.  PORTABLE CHEST - 1 VIEW  Comparison: 1 day prior  Findings: Cardiomegaly accentuated by AP portable technique. Slight increase in a layering small to moderate right pleural effusion.  A small left pleural effusion is similar. No pneumothorax.  Underlying asymmetric interstitial edema is greater on the right than left and not significantly changed.  Right greater left bibasilar airspace disease is unchanged.  IMPRESSION:  1. Minimal worsening in small to moderate right pleural effusion. 2.  Otherwise, similar appearance of the chest with asymmetric interstitial edema and bibasilar atelectasis or infection.   Original Report Authenticated By: Jeronimo Greaves, M.D.    Dg Chest Port 1 View  12/24/2012  *RADIOLOGY REPORT*  Clinical Data: Larey Seat.  Weakness.  PORTABLE CHEST - 1 VIEW  Comparison: 12/05/2012.  Findings: The cardiac silhouette, mediastinal and hilar contours are stable.  There is an enlarging right pleural effusion with overlying atelectasis or infiltrate.  Persistent left basilar atelectasis or infiltrate.  No definite pneumothorax.  IMPRESSION: Worsening lung aeration with increasing right pleural effusion and increasing bibasilar atelectasis or infiltrates.   Original Report Authenticated By:  Rudie Meyer, M.D.     Micro Results      Recent Results (from the past 240 hour(s))  URINE CULTURE     Status: Normal   Collection Time   12/24/12 12:08 PM      Component Value Range Status Comment   Specimen Description URINE, CATHETERIZED   Final    Special Requests NONE   Final    Culture  Setup Time 12/24/2012 12:31   Final    Colony Count NO GROWTH   Final    Culture NO GROWTH   Final    Report Status 12/25/2012 FINAL   Final   CULTURE, BLOOD (ROUTINE X 2)     Status: Normal   Collection Time   12/24/12  3:00 PM      Component Value Range Status Comment   Specimen Description BLOOD ARM RIGHT   Final    Special Requests BOTTLES DRAWN AEROBIC AND ANAEROBIC 10CC   Final    Culture  Setup Time 12/24/2012 21:20   Final    Culture NO GROWTH 5 DAYS   Final    Report Status 12/30/2012 FINAL   Final      CBC w Diff: Lab Results  Component Value Date   WBC 9.2 12/26/2012   HGB 13.2 12/26/2012   HCT 41.4 12/26/2012   PLT 269 12/26/2012   LYMPHOPCT 18 12/24/2012   MONOPCT 8 12/24/2012   EOSPCT 3 12/24/2012   BASOPCT 0 12/24/2012    CMP: Lab Results  Component Value Date   NA 142 12/28/2012   K 3.7 12/28/2012   CL 106 12/28/2012   CO2 27 12/28/2012   BUN 16 12/28/2012   CREATININE 1.36* 12/28/2012   PROT 6.4 12/05/2012   ALBUMIN 3.3* 12/05/2012   BILITOT 0.6 12/05/2012   ALKPHOS 69 12/05/2012   AST 26 12/05/2012   ALT 19 12/05/2012  .   Discharge Instructions     Follow with Primary MD [name],  MD in 3 days    Get CBC, CMP, checked 3 days by Primary MD or SNF MD and again as instructed by your Primary MD. Get a 2 view Chest X ray done next visit.   Get Medicines reviewed and adjusted.  Please request your Prim.MD to go over all Hospital Tests and Procedure/Radiological results at the follow up, please get all Hospital records sent to your Prim MD by signing hospital release before you go home.   Activity: As tolerated with Full fall precautions use walker/cane &  assistance as needed   Diet:  Heart Healthy,  Fluid restriction 1.8 lit/day, Aspiration precautions.  Check your Weight same time everyday, if you gain over 2 pounds, or you develop in leg swelling, experience more shortness of breath or chest pain, call your Primary MD immediately. Follow Cardiac Low Salt Diet and 1.8 lit/day fluid restriction.  Disposition SNF  If you experience worsening of your admission symptoms, develop shortness of breath, life threatening emergency, suicidal or homicidal thoughts you must seek medical attention immediately by calling 911 or calling your MD immediately  if symptoms less severe.  You Must read complete instructions/literature along with all the possible adverse reactions/side effects for all the Medicines you take and that have been prescribed to you. Take any new Medicines after you have completely understood and accpet all the possible adverse reactions/side effects.   Do not drive and provide baby sitting services if your were admitted for syncope or siezures until you have seen by Primary MD or a Neurologist and advised to do so again.  Do not drive when taking Pain medications.    Do not take more than prescribed Pain, Sleep and Anxiety Medications  Special Instructions: If you have smoked or chewed Tobacco  in the last 2 yrs please stop smoking, stop any regular Alcohol  and or any Recreational drug use.   Wear Seat belts while driving.   Follow-up Information    Follow up with [name], MD. Schedule an appointment as soon as possible for a visit in 3 days.   Contact information:   [address] [place] [address] [phone]       Follow up with Tereso Newcomer, PA. On 01/14/2013. (At 2:00 PM for hospital follow-up)    Contact information:   [address] [place] [address] [phone]       Follow up with [name], MD.   Contact information:   [address]  [place] [address] [phone]       Follow up with Arvilla Meres, MD. Schedule an appointment as soon as possible for a visit in 2 weeks.   Contact information:   [address] [place] [address] [phone]            Discharge Medications     Medication List     As of 12/30/2012 10:40 AM    START taking these medications         diltiazem 180 MG 24 hr capsule   Commonly known as: CARDIZEM CD   Take 1 capsule (180 mg total) by mouth daily.      DSS 100 MG Caps   Take 100 mg by mouth 2 (two) times daily.      CHANGE how you take these medications         furosemide 20 MG tablet   Commonly  known as: LASIX   Take 1 tablet (20 mg total) by mouth daily.   What changed: how often to take the med      CONTINUE taking these medications         albuterol 108 (90 BASE) MCG/ACT inhaler   Commonly known as: PROVENTIL HFA;VENTOLIN HFA   Inhale 2 puffs into the lungs every 6 (six) hours as needed for wheezing or shortness of breath.      cholecalciferol 1000 UNITS tablet   Commonly known as: VITAMIN D      clopidogrel 75 MG tablet   Commonly known as: PLAVIX   Take 1 tablet (75 mg total) by mouth daily.      cyclobenzaprine 10 MG tablet   Commonly known as: FLEXERIL      folic acid 1 MG tablet   Commonly known as: FOLVITE      gabapentin 300 MG capsule   Commonly known as: NEURONTIN   Take 1 capsule (300 mg total) by mouth at bedtime.      hyoscyamine 0.125 MG tablet   Commonly known as: LEVSIN, ANASPAZ      levothyroxine 137 MCG tablet   Commonly known as: SYNTHROID, LEVOTHROID   Take 1 tablet (137 mcg total) by mouth daily.      methotrexate 2.5 MG tablet   Commonly known as: RHEUMATREX      pravastatin 40 MG tablet   Commonly known as: PRAVACHOL   Take 1 tablet (40 mg total) by mouth daily.      solifenacin 5 MG tablet   Commonly known as: VESICARE      traZODone 100 MG tablet   Commonly known as: DESYREL        VITAMIN B-12 IJ      vitamin C 500 MG tablet   Commonly known as: ASCORBIC ACID      STOP taking these medications         hydrALAZINE 25 MG tablet   Commonly known as: APRESOLINE      HYDROcodone-acetaminophen 5-500 MG per tablet   Commonly known as: VICODIN      hydrOXYzine 10 MG tablet   Commonly known as: ATARAX/VISTARIL      traMADol 50 MG tablet   Commonly known as: ULTRAM          Where to get your medications    These are the prescriptions that you need to pick up. We sent them to a specific pharmacy, so you will need to go there to get them.   [facility] [address] [place], [place] - [address]    [address] [place] [address]    Phone: [phone]        furosemide 20 MG tablet         Information on where to get these meds is not yet available. Ask your nurse or doctor.         diltiazem 180 MG 24 hr capsule   DSS 100 MG Caps               Total Time in preparing paper work, data evaluation and todays exam - 35 minutes  Leroy Sea M.D on 12/30/2012 at 10:40 AM  [facility] Office  [phone]

## 2012-12-30 NOTE — Progress Notes (Signed)
Patient: [name] Date of Encounter: 12/30/2012, 8:39 AM Admit date: 12/24/2012     Subjective  Mr. Teschner has no new complaints this AM. He reports persistent fatigue/weakness. He is scheduled for DC to rehab today. He denies CP or SOB.    Objective  Physical Exam: Vitals: BP 138/72  Pulse 65  Temp 98.2 F (36.8 C) (Axillary)  Resp 18  Ht 5\' 8"  (1.727 m)  Wt 209 lb 14.1 oz (95.2 kg)  BMI 31.91 kg/m2  SpO2 95% General: Well developed, elderly 79 year old male in no acute distress. Neck: Supple. JVD not elevated. Lungs: Diminished breath sounds on right otherwise clear bilaterally to auscultation without wheezes, rales, or rhonchi. Breathing is unlabored. Heart: RRR S1 S2 with II/VI systolic murmur. No rub or gallop.  Abdomen: Soft, non-distended. Extremities: No clubbing or cyanosis. 1+ pedal edema bilaterally.   Neuro: Alert and oriented X 3. Moves all extremities spontaneously. No focal deficits.  Intake/Output:  Intake/Output Summary (Last 24 hours) at 12/30/12 0839 Last data filed at 12/30/12 0500  Gross per 24 hour  Intake    340 ml  Output   1250 ml  Net   -910 ml    Inpatient Medications:     . albuterol  2.5 mg Nebulization BID  . atorvastatin  10 mg Oral q1800  . cholecalciferol  1,000 Units Oral Daily  . clopidogrel  75 mg Oral Daily  . darifenacin  7.5 mg Oral Daily  . diltiazem  180 mg Oral Daily  . docusate sodium  100 mg Oral BID  . enoxaparin (LOVENOX) injection  40 mg Subcutaneous Q24H  . fluticasone  1 spray Each Nare Daily  . folic acid  1 mg Oral Daily  . furosemide  20 mg Oral Daily  . gabapentin  300 mg Oral QHS  . insulin aspart  0-15 Units Subcutaneous TID WC  . levothyroxine  137 mcg Oral QAC breakfast  . senna  1 tablet Oral BID  . sodium chloride  3 mL Intravenous Q12H  . traZODone  100 mg Oral QHS    Labs:  Basename 12/28/12 0634  NA 142  K 3.7  CL 106  CO2 27  GLUCOSE 101*  BUN 16  CREATININE 1.36*  CALCIUM 8.6    MG --  PHOS --    Radiology/Studies: Dg Chest Port 1 View  12/25/2012  *RADIOLOGY REPORT*  Clinical Data: Shortness of breath and cough.  Evaluate pleural effusion.  PORTABLE CHEST - 1 VIEW  Comparison: 1 day prior  Findings: Cardiomegaly accentuated by AP portable technique. Slight increase in a layering small to moderate right pleural effusion.  A small left pleural effusion is similar. No pneumothorax.  Underlying asymmetric interstitial edema is greater on the right than left and not significantly changed.  Right greater left bibasilar airspace disease is unchanged.  IMPRESSION:  1. Minimal worsening in small to moderate right pleural effusion. 2.  Otherwise, similar appearance of the chest with asymmetric interstitial edema and bibasilar atelectasis or infection.   Original Report Authenticated By: Jeronimo Greaves, M.D.    Dg Chest Port 1 View  12/24/2012  *RADIOLOGY REPORT*  Clinical Data: Larey Seat.  Weakness.  PORTABLE CHEST - 1 VIEW  Comparison: 12/05/2012.  Findings: The cardiac silhouette, mediastinal and hilar contours are stable.  There is an enlarging right pleural effusion with overlying atelectasis or infiltrate.  Persistent left basilar atelectasis or infiltrate.  No definite pneumothorax.  IMPRESSION: Worsening lung aeration with  increasing right pleural effusion and increasing bibasilar atelectasis or infiltrates.   Original Report Authenticated By: Rudie Meyer, M.D.     Telemetry: currently in sinus rhythm with occasional PVCs/couplets    Assessment and Plan  1. Syncope Most likely related to orthostasis 2. Paroxysmal atrial fibrillation Stable; has had intermittent AFib while here with controlled rates; now in SR Not a candidate for anticoagulation due to h/o bleeding (epistaxis requiring hospitalization) and falls  3. Aortic stenosis Stable; followed by Dr. Antoine Poche 4. CAD Stable without anginal symptoms 5. Chronic diastolic HF Stable Continue medical therapy as BP  allows  Signed, EDMISTEN, BROOKE PA-C  I have seen, examined the patient, and reviewed the above assessment and plan.  Changes to above are made where necessary.   Pt clinically improved.  Appears mildly volume overloaded today, though diuresis is limited by postural dizziness.  Continue current medical therapy.  Anticipate transfer to rehab.  Will need close outpatient follow-up with Dr Antoine Poche.   Co Sign: Hillis Range, MD 12/30/2012 8:52 AM

## 2013-01-01 NOTE — Clinical Social Work Placement (Signed)
    Clinical Social Work Department CLINICAL SOCIAL WORK PLACEMENT NOTE 01/01/2013  Patient:  [name], [name]  Account Number:  [id number] Admit date:  12/24/2012  Clinical Social Worker:  Lupita Leash Rosaleen Mazer, LCSWA  Date/time:  12/27/2012 05:00 AM  Clinical Social Work is seeking post-discharge placement for this patient at the following level of care:   SKILLED NURSING   (*CSW will update this form in Epic as items are completed)   12/27/2012  Patient/family provided with [facility] Department of Clinical Social Work's list of facilities offering this level of care within the geographic area requested by the patient (or if unable, by the patient's family).  12/27/2012  Patient/family informed of their freedom to choose among providers that offer the needed level of care, that participate in Medicare, Medicaid or managed care program needed by the patient, have an available bed and are willing to accept the patient.  12/27/2012  Patient/family informed of [facility]' ownership interest in [facility], as well as of the fact that they are under no obligation to receive care at this facility.  PASARR submitted to EDS on  PASARR number received from EDS on   FL2 transmitted to all facilities in geographic area requested by pt/family on   FL2 transmitted to all facilities within larger geographic area on   Patient informed that his/her managed care company has contracts with or will negotiate with  certain facilities, including the following:   Patient has existing PASARR number     Patient/family informed of bed offers received:   Patient chooses bed at  Physician recommends and patient chooses bed at    Patient to be transferred to  on   Patient to be transferred to facility by Ambulance  Sharin Mons)  The following physician request were entered in Epic:   Additional Comments:

## 2013-01-01 NOTE — Clinical Social Work Psychosocial (Addendum)
    Clinical Social Work Department BRIEF PSYCHOSOCIAL ASSESSMENT 01/01/2013  Patient:  [name], [name]     Account Number:  [id number]     Admit date:  12/24/2012  Clinical Social Worker:  Tiburcio Pea  Date/Time:  12/27/2012 12:00 M  Referred by:  Physician  Date Referred:  12/27/2012 Referred for  SNF Placement   Other Referral:   Interview type:  Other - See comment Other interview type:   Patient and daughter [name]    PSYCHOSOCIAL DATA Living Status:  WIFE Admitted from facility:   Level of care:   Primary support name:  [name]  (C) [phone] Primary support relationship to patient:  CHILD, ADULT Degree of support available:   Strong support    son:  [name]  [phone] (C)    CURRENT CONCERNS Current Concerns  Post-Acute Placement   Other Concerns:   Pt's wife has advanced dementia. Currently has 24 hour caregiver at home for his wife.    SOCIAL WORK ASSESSMENT / PLAN Met with patient and his daughter [name] today.  Patinet had wanted to go home at d/c but now feels that he will benefit from short term SNF as he cannot walk. His wife is at home with 24 hr caregiver but patient relates that his care would be too much for the caregiver. Son and daughter are very invovled and supportive. Patient only wants short term SNF. Discussed SNF process with patientt and daughter- he has been at [facility] in the past but wants [facility] if possible. Also wants to consider [facility].  CSW will initiate bed search and place Fl2 on chart for MD's signature.   Assessment/plan status:  Psychosocial Support/Ongoing Assessment of Needs Other assessment/ plan:   Information/referral to community resources:   SNF bed list given to patient  Aftercare needs discussed to be arranged by SNF if indicated prior to d/c.    PATIENT'S/FAMILY'S RESPONSE TO PLAN OF CARE: Patient is alert, oriented and very pleasant. He really wanted to go  home but he now feels that he is too weak. Patient states "my leg won't work right now".  He is anxious to be able to return home as his wife is there with a 24 hour caregiver. Daughter is very invovled as is patient's son and they fully support SNF plan.  CSW will assist and follow up with bed offers.  Per MD- patient is stable for d/c.  Unable to reach [facility] Admissions staff but spoke to [facility]- they anticipate a bed on Monday.  Patient and daughter notified of this.  Will follow up on Monday.

## 2013-01-02 ENCOUNTER — Ambulatory Visit: Payer: Medicare Other | Admitting: Internal Medicine

## 2013-01-02 DIAGNOSIS — Z0289 Encounter for other administrative examinations: Secondary | ICD-10-CM

## 2013-01-02 NOTE — Progress Notes (Signed)
Utilization Review Completed.   Jiali Linney, RN, BSN Nurse Case Manager  [phone]  

## 2013-01-03 ENCOUNTER — Other Ambulatory Visit: Payer: Self-pay | Admitting: Internal Medicine

## 2013-01-13 ENCOUNTER — Ambulatory Visit (HOSPITAL_COMMUNITY): Payer: Medicare Other

## 2013-01-14 ENCOUNTER — Encounter: Payer: Medicare Other | Admitting: Physician Assistant

## 2013-01-20 ENCOUNTER — Telehealth: Payer: Self-pay

## 2013-01-20 NOTE — Telephone Encounter (Signed)
Ok for verbal 

## 2013-01-20 NOTE — Telephone Encounter (Signed)
[name] needs verbal to see this patient for 9 weeks.  Also would like to start the cardio pulmonary program as well.  Also needs home health aide and RN stated if they could also have a verbal to give the patient his B12 injection he would not have to come to the MD office for that.  Please advise on all request and verbal is ok.

## 2013-01-20 NOTE — Telephone Encounter (Signed)
HHRN informed of MD's verbal ok.

## 2013-01-21 ENCOUNTER — Encounter: Payer: Self-pay | Admitting: Internal Medicine

## 2013-01-21 ENCOUNTER — Ambulatory Visit (INDEPENDENT_AMBULATORY_CARE_PROVIDER_SITE_OTHER): Payer: Medicare Other | Admitting: Internal Medicine

## 2013-01-21 VITALS — BP 112/72 | HR 77 | Temp 97.9°F | Ht 68.0 in | Wt 205.1 lb

## 2013-01-21 DIAGNOSIS — I10 Essential (primary) hypertension: Secondary | ICD-10-CM

## 2013-01-21 DIAGNOSIS — F329 Major depressive disorder, single episode, unspecified: Secondary | ICD-10-CM

## 2013-01-21 DIAGNOSIS — M1990 Unspecified osteoarthritis, unspecified site: Secondary | ICD-10-CM

## 2013-01-21 MED ORDER — CITALOPRAM HYDROBROMIDE 10 MG PO TABS: 10.0000 mg | ORAL_TABLET | Freq: Every day | ORAL | Status: DC

## 2013-01-21 MED ORDER — TRAMADOL HCL 50 MG PO TABS: 50.0000 mg | ORAL_TABLET | Freq: Four times a day (QID) | ORAL | Status: DC | PRN

## 2013-01-21 MED ORDER — SOLIFENACIN SUCCINATE 5 MG PO TABS: 5.0000 mg | ORAL_TABLET | Freq: Two times a day (BID) | ORAL | Status: DC

## 2013-01-21 NOTE — Assessment & Plan Note (Signed)
Ok for tramadol prn use knee pain,  to f/u any worsening symptoms or concerns

## 2013-01-21 NOTE — Assessment & Plan Note (Signed)
stable overall by history and exam, recent data reviewed with pt, and pt to continue medical treatment as before,  to f/u any worsening symptoms or concerns BP Readings from Last 3 Encounters:  01/21/13 112/72  12/30/12 124/56  12/05/12 108/72

## 2013-01-21 NOTE — Patient Instructions (Addendum)
Please take all new medication as prescribed - the citalopram for depression, and the tramadol for pain Please continue all other medications as before, and refills have been done if requested (such as the vesicare) Robin - to call Genevieve Norlander - OK for B12 shots to be done per Genevieve Norlander at home Please have the pharmacy call with any other refills you may need. Please keep your appointments with your specialists as you have planned - cardiology No further lab work or xrays needed today Please continue with Genevieve Norlander as you are doing Please remember to sign up for My Chart if you have not done so, as this will be important to you in the future with finding out test results, communicating by private email, and scheduling acute appointments online when needed. Please return Mar 11 as planned

## 2013-01-21 NOTE — Assessment & Plan Note (Signed)
To start citalopram 10 qd,  to f/u any worsening symptoms or concerns

## 2013-01-21 NOTE — Progress Notes (Signed)
Subjective:    Patient ID: [name], male    DOB: October 14, 1923, 79 y.o.   MRN: [id number]  HPI here to f/u, just d/c from hosp jan 6, [facility], home now for 4 days, with hospn with afib, syncope apparently related to orthostasis, tx with IVF's, meds adjusted, did ok with PT.  Has appt with cardiology soon.  No overt bleeding or bruising, in fact seems less now on the plavix.  Needs vesicare change to #60, needs home care to do vit b12 shots - [facility] home [facility]. Appetite still not great, does take boost supplements.  Has had some consitpation, finally with BM today, no blood. Pt denies chest pain, increased sob or doe, wheezing, orthopnea, PND, increased LE swelling more than baseline LLE chronic edema, but no palpitations, dizziness or syncope.  Does tend to sit and sleep more now.  Pt denies new neurological symptoms such as new headache, or facial or extremity weakness or numbness, no increased confusion recently.  Walking with walker now, no falls in past 4 days.   Pt denies fever,  night sweats, or other constitutional symptoms.  Has lost 3 lbs overall since dec 4.  Wears compression stocking daily.  Has had increased knee pain with getting around at home in past few days.  + depressed, non suicidal, not really nervous.   Past Medical History  Diagnosis Date  . CAD (coronary artery disease)     s/p NSTEMI and BMS stent diagonal07/09;  Lexiscan Myoview 9/13:  EF 62%, no ischemia  . AS (aortic stenosis)     a.  Echo 2009 mean gradient 9mm HG. AVA 2.18;  b.  Echo 4/11 mild AS mean gradient 10mm HG;  c.  Echo 04/2011: Mild LVH, EF 55-60%, grade 1 diastolic dysfunction, mild aortic stenosis, mean gradient 14, mild LAE;  d. Echo 9/13: mod LVH, EF 50-55%, mild to mod AS, mean gradient 17 mmHg  . AF (paroxysmal atrial fibrillation)     Holter monitor 2.5 sec pauses  . Fatigue     CPX 11/09 VO2  14.4 (75% predicte)d, slope 35.  O2 pulse normal.  VO2 corrected for body weight 17.6.    Marland Kitchen Hypothyroidism   . Hyperlipidemia   . History of prostate cancer   . Allergic rhinitis   . DM (diabetes mellitus)     type II diet controlled  . Shingles   . Left carotid stenosis   . Vitamin B 12 deficiency   . Anemia   . Osteoarthritis   . Nephrolithiasis   . Obesity   . OSA (obstructive sleep apnea)     AHI 15 PSG 09/06/08  . Hx of colonoscopy   . Stroke   . Anxiety and depression   . PMR (polymyalgia rheumatica) 05/29/2012  . Hypogonadism male 05/29/2012  . Hyperparathyroidism 05/29/2012  . TIA (transient ischemic attack) 05/29/2012  . CKD (chronic kidney disease) stage 4, GFR 15-29 ml/min 05/29/2012  . COPD (chronic obstructive pulmonary disease) 05/29/2012  . Myocardial infarction   . Cancer     prostate ca history   Past Surgical History  Procedure Date  . Appendectomy   . Hernia repair   . Right carpal tunnel release   . Lumbar spine surgery   . Cystectomy     neck  . Prostatectomy   . Vasectomy   . Total knee arthroplasty     right  . Cholecystectomy     reports that he quit smoking  about 54 years ago. His smoking use included Cigarettes. He has a 12.5 pack-year smoking history. He has quit using smokeless tobacco. He reports that he does not drink alcohol or use illicit drugs. family history includes Alcohol abuse (age of onset:69) in his father; Coronary artery disease in an unspecified family member; and Stroke (age of onset:69) in his mother. Allergies  Allergen Reactions  . Bactrim (Sulfamethoxazole W-Trimethoprim) Other (See Comments)    hallucinations  . Horse-Derived Products Hives   Current Outpatient Prescriptions on File Prior to Visit  Medication Sig Dispense Refill  . albuterol (PROVENTIL HFA;VENTOLIN HFA) 108 (90 BASE) MCG/ACT inhaler Inhale 2 puffs into the lungs every 6 (six) hours as needed for wheezing or shortness of breath.  1 Inhaler  5  . Ascorbic Acid (VITAMIN C) 500 MG tablet Take 500 mg by mouth daily.        . cholecalciferol (VITAMIN  D) 1000 UNITS tablet Take 1,000 Units by mouth daily.      . clopidogrel (PLAVIX) 75 MG tablet Take 1 tablet (75 mg total) by mouth daily.  90 tablet  3  . Cyanocobalamin (VITAMIN B-12 IJ) Inject 1,000 mcg into the muscle every 14 (fourteen) days. Every 2 weeks      . cyclobenzaprine (FLEXERIL) 10 MG tablet Take 5-10 mg by mouth 3 (three) times daily as needed. Muscle spasms.      Marland Kitchen diltiazem (CARDIZEM CD) 180 MG 24 hr capsule Take 1 capsule (180 mg total) by mouth daily.      Marland Kitchen docusate sodium 100 MG CAPS Take 100 mg by mouth 2 (two) times daily.  10 capsule    . folic acid (FOLVITE) 1 MG tablet Take 1 mg by mouth daily.      . furosemide (LASIX) 20 MG tablet Take 1 tablet (20 mg total) by mouth daily.  30 tablet  3  . gabapentin (NEURONTIN) 300 MG capsule Take 1 capsule (300 mg total) by mouth at bedtime.  30 capsule  5  . hyoscyamine (LEVSIN, ANASPAZ) 0.125 MG tablet Take 0.125 mg by mouth 4 (four) times daily as needed. Gas pain.      Marland Kitchen levothyroxine (SYNTHROID, LEVOTHROID) 137 MCG tablet Take 1 tablet (137 mcg total) by mouth daily.  90 tablet  3  . methotrexate (RHEUMATREX) 2.5 MG tablet Take 10 mg by mouth once a week. Takes 4 tablets weekly on  Sunday.   Caution:Chemotherapy. Protect from light.      . pravastatin (PRAVACHOL) 40 MG tablet TAKE ONE TABLET BY MOUTH EVERY DAY  30 tablet  5  . solifenacin (VESICARE) 5 MG tablet Take 5 mg by mouth daily.        . traZODone (DESYREL) 100 MG tablet Take 100 mg by mouth at bedtime.       Review of Systems  Constitutional: Negative for unexpected weight change, or unusual diaphoresis  HENT: Negative for tinnitus.   Eyes: Negative for photophobia and visual disturbance.  Respiratory: Negative for choking and stridor.   Gastrointestinal: Negative for vomiting and blood in stool.  Genitourinary: Negative for hematuria and decreased urine volume.  Musculoskeletal: Negative for acute joint swelling Skin: Negative for color change and wound.   Neurological: Negative for tremors and numbness other than noted  Psychiatric/Behavioral: Negative for decreased concentration or  hyperactivity.       Objective:   Physical Exam BP 112/72  Pulse 77  Temp 97.9 F (36.6 C) (Oral)  Ht 5\' 8"  (1.727 m)  Wt 205  lb 2 oz (93.044 kg)  BMI 31.19 kg/m2  SpO2 92% VS noted, not ill appearing, walks with walker, gets up on exam table with assist Constitutional: Pt appears well-developed and well-nourished but frail.  HENT: Head: NCAT.  Right Ear: External ear normal.  Left Ear: External ear normal.  Eyes: Conjunctivae and EOM are normal. Pupils are equal, round, and reactive to light.  Neck: Normal range of motion. Neck supple.  Cardiovascular: Normal rate and regular rhythm.   Pulmonary/Chest: Effort normal and breath sounds normal.  Abd:  Soft, NT, non-distended, + BS bilat knees warm without effusion but with crepitus Neurological: Pt is alert. Not confused , motor intact Skin: Skin is warm. No erythema. has 1+ LLE edema (chronic) Psychiatric: Pt behavior is normal. Thought content normal. But + depressed affect, not nervous     Assessment & Plan:

## 2013-01-23 ENCOUNTER — Telehealth: Payer: Self-pay | Admitting: Internal Medicine

## 2013-01-23 NOTE — Telephone Encounter (Signed)
HHRN informed 

## 2013-01-23 NOTE — Telephone Encounter (Signed)
Ok for verbal 

## 2013-01-23 NOTE — Telephone Encounter (Signed)
Nurse from [facility] is calling to get a verbal order to continue OT next week, when he went out for this visit this week the patient refused to be seen because he was not feeling well, Nurse can be reached at [phone]

## 2013-01-29 ENCOUNTER — Telehealth: Payer: Self-pay | Admitting: Internal Medicine

## 2013-01-29 MED ORDER — SERTRALINE HCL 50 MG PO TABS: 50.0000 mg | ORAL_TABLET | Freq: Every day | ORAL | Status: DC

## 2013-01-29 NOTE — Telephone Encounter (Signed)
In case the lethargy and HA is new since last visit, ok to change the citalopram to zoloft 50 qd - done erx (may help sleeping as well eventually)  o2 sat ok at last OV

## 2013-01-29 NOTE — Telephone Encounter (Signed)
HHRN stated the patient is lethargic and having headaches.  When in assisted living he was on oxygen.  The patient is not sleeping well as well.  HHRN would like a call back

## 2013-01-29 NOTE — Telephone Encounter (Signed)
Pam at [facility] is calling to speak with assistant  To discuss the patients symptoms, he is lethargic and having headaches, call Pam @ [phone]

## 2013-01-30 ENCOUNTER — Telehealth: Payer: Self-pay | Admitting: Internal Medicine

## 2013-01-30 NOTE — Telephone Encounter (Signed)
Caller: Joe/; Phone: [phone]; Reason for Call: Have a duplication Rx.  Filled a RX for Citalopram on 01/21/13 and today 01/30/13 they are  getting a prescription for Zoloft today.  Are we stopping the citalopram?  Please verify medication and advise- Reviewd H&P in epic.  Note per Dr. Jonny Ruiz- [name], MD 01/29/2013 10:25 PM Signed  In case the lethargy and HA is new since last visit, ok to change the citalopram to zoloft 50 qd - done erx (may help sleeping as well eventually)  Pharmacy updated they Dr. Jonny Ruiz wanted to change the citalopram to zoloft. Understanding expressed

## 2013-01-30 NOTE — Telephone Encounter (Signed)
Called [facility] left detailed message informed of MD instructions.

## 2013-01-30 NOTE — Telephone Encounter (Signed)
Caller: Joe/; Phone: [phone]; Reason for Call: Have a duplication Rx.  Filled a RX for Citalopram on 01/21/13 for patient.  Today, 01/30/13 they received a presscription for Zoloft today.  Are we stopping the citalopram?  Please verify medication and advise  Reviewed Epic and advised pharmacist per note by physician - They are stopping the Citalopram and starting Zoloft.

## 2013-01-31 ENCOUNTER — Telehealth: Payer: Self-pay

## 2013-01-31 NOTE — Telephone Encounter (Signed)
Ok for verbal 

## 2013-01-31 NOTE — Telephone Encounter (Signed)
[name] [phone]) needs verbal ok to continue care

## 2013-01-31 NOTE — Telephone Encounter (Signed)
HHRN informed 

## 2013-02-18 DIAGNOSIS — J441 Chronic obstructive pulmonary disease with (acute) exacerbation: Secondary | ICD-10-CM

## 2013-02-18 DIAGNOSIS — E119 Type 2 diabetes mellitus without complications: Secondary | ICD-10-CM

## 2013-02-18 DIAGNOSIS — I10 Essential (primary) hypertension: Secondary | ICD-10-CM

## 2013-02-18 DIAGNOSIS — E669 Obesity, unspecified: Secondary | ICD-10-CM

## 2013-02-18 DIAGNOSIS — I4891 Unspecified atrial fibrillation: Secondary | ICD-10-CM

## 2013-02-18 DIAGNOSIS — I509 Heart failure, unspecified: Secondary | ICD-10-CM

## 2013-02-20 ENCOUNTER — Telehealth: Payer: Self-pay | Admitting: Internal Medicine

## 2013-02-20 MED ORDER — CYANOCOBALAMIN 1000 MCG/ML IJ SOLN: INTRAMUSCULAR | Status: DC

## 2013-02-20 NOTE — Telephone Encounter (Signed)
rx sent to [facility]

## 2013-02-20 NOTE — Telephone Encounter (Signed)
Called to request new Rx for Vitamin B-12 injections be sent to [facility] [phone].  Has home heath nurse for next 3 weeks ending 03/16/13; nurse willing to give the Vitamin B 12 injections but needs Rx called to pharmacy.  Next home visit is for 02/24/13.  Please call [name] to advise when the medication is ordered.

## 2013-02-26 ENCOUNTER — Ambulatory Visit (INDEPENDENT_AMBULATORY_CARE_PROVIDER_SITE_OTHER): Payer: Medicare Other

## 2013-02-26 DIAGNOSIS — E538 Deficiency of other specified B group vitamins: Secondary | ICD-10-CM

## 2013-02-26 MED ORDER — CYANOCOBALAMIN 1000 MCG/ML IJ SOLN
1000.0000 ug | Freq: Once | INTRAMUSCULAR | Status: AC
  Administered 2013-02-26: 1000 ug via INTRAMUSCULAR

## 2013-03-02 ENCOUNTER — Other Ambulatory Visit: Payer: Self-pay | Admitting: Internal Medicine

## 2013-03-04 ENCOUNTER — Ambulatory Visit (INDEPENDENT_AMBULATORY_CARE_PROVIDER_SITE_OTHER): Payer: Medicare Other | Admitting: Internal Medicine

## 2013-03-04 ENCOUNTER — Other Ambulatory Visit (INDEPENDENT_AMBULATORY_CARE_PROVIDER_SITE_OTHER): Payer: Medicare Other

## 2013-03-04 ENCOUNTER — Encounter: Payer: Self-pay | Admitting: Internal Medicine

## 2013-03-04 VITALS — BP 102/70 | HR 93 | Temp 97.4°F | Wt 197.0 lb

## 2013-03-04 DIAGNOSIS — I10 Essential (primary) hypertension: Secondary | ICD-10-CM

## 2013-03-04 DIAGNOSIS — F329 Major depressive disorder, single episode, unspecified: Secondary | ICD-10-CM

## 2013-03-04 DIAGNOSIS — R531 Weakness: Secondary | ICD-10-CM

## 2013-03-04 DIAGNOSIS — R5383 Other fatigue: Secondary | ICD-10-CM

## 2013-03-04 DIAGNOSIS — M353 Polymyalgia rheumatica: Secondary | ICD-10-CM

## 2013-03-04 LAB — CBC WITH DIFFERENTIAL/PLATELET
Basophils Absolute: 0 10*3/uL (ref 0.0–0.1)
Hemoglobin: 14 g/dL (ref 13.0–17.0)
Lymphocytes Relative: 25.2 % (ref 12.0–46.0)
Monocytes Relative: 7 % (ref 3.0–12.0)
Neutro Abs: 7 10*3/uL (ref 1.4–7.7)
Neutrophils Relative %: 65.4 % (ref 43.0–77.0)
Platelets: 362 10*3/uL (ref 150.0–400.0)
RDW: 16.2 % — ABNORMAL HIGH (ref 11.5–14.6)

## 2013-03-04 LAB — URINALYSIS, ROUTINE W REFLEX MICROSCOPIC
Leukocytes, UA: NEGATIVE
Nitrite: NEGATIVE
Specific Gravity, Urine: 1.025 (ref 1.000–1.030)
pH: 6 (ref 5.0–8.0)

## 2013-03-04 LAB — BASIC METABOLIC PANEL
CO2: 26 mEq/L (ref 19–32)
Calcium: 8.9 mg/dL (ref 8.4–10.5)
Glucose, Bld: 97 mg/dL (ref 70–99)
Sodium: 139 mEq/L (ref 135–145)

## 2013-03-04 LAB — HEPATIC FUNCTION PANEL
Albumin: 3.4 g/dL — ABNORMAL LOW (ref 3.5–5.2)
Alkaline Phosphatase: 61 U/L (ref 39–117)
Total Protein: 6.6 g/dL (ref 6.0–8.3)

## 2013-03-04 MED ORDER — METHYLPREDNISOLONE ACETATE 80 MG/ML IJ SUSP
120.0000 mg | Freq: Once | INTRAMUSCULAR | Status: AC
  Administered 2013-03-04: 120 mg via INTRAMUSCULAR

## 2013-03-04 NOTE — Assessment & Plan Note (Signed)
Ok for depo 80 mg IM today, to f/u with rheum o/w as planned

## 2013-03-04 NOTE — Assessment & Plan Note (Signed)
Etiology unclear, Exam otherwise benign, to check labs as documented, follow with expectant management  

## 2013-03-04 NOTE — Assessment & Plan Note (Signed)
Despite urging, he declines further tx such as low dose ssri

## 2013-03-04 NOTE — Patient Instructions (Signed)
You had the steroid shot today Please continue all other medications as before Please make a follow up appointment with your rheumatologist regarding the PMR  Please go to the LAB in the Basement (turn left off the elevator) for the tests to be done today You will be contacted by phone if any changes need to be made immediately.  Otherwise, you will receive a letter about your results with an explanation Please remember to sign up for My Chart if you have not done so, as this will be important to you in the future with finding out test results, communicating by private email, and scheduling acute appointments online when needed. Please return in 3 months, or sooner if needed

## 2013-03-04 NOTE — Progress Notes (Signed)
Subjective:    Patient ID: [name], male    DOB: 1922-12-28, 79 y.o.   MRN: [id number]  HPI Here to f/u, Here to f/u; overall doing ok,  Pt denies chest pain, increased sob or doe, wheezing, orthopnea, PND, increased LE swelling, palpitations, dizziness or syncope.  Pt denies polydipsia, polyuria, or low sugar symptoms such as weakness or confusion improved with po intake.  Pt denies new neurological symptoms such as new headache, or facial or extremity weakness or numbness.   Pt states overall good compliance with meds, has been trying to follow lower cholesterol diet, with wt overall stable, now 2 mo s/p jan 2014 hospn with addition cardizem and decreased lasix to 20 qd.  Does c/o general weakness ? Mild increased, has ongoing depression but c/o mult pills he takes every day and declines further tx or counseling.  No suicidal ideation.  No recent falls, using walker regularly.  No fever.  Does incidentally have flare of PMR like symptoms x 2 days to shoulders and hips, now off prednisone, still on mtx weekly, sees rheum on regular basis, and does not want major change in current tx. Past Medical History  Diagnosis Date  . CAD (coronary artery disease)     s/p NSTEMI and BMS stent diagonal07/09;  Lexiscan Myoview 9/13:  EF 62%, no ischemia  . AS (aortic stenosis)     a.  Echo 2009 mean gradient 9mm HG. AVA 2.18;  b.  Echo 4/11 mild AS mean gradient 10mm HG;  c.  Echo 04/2011: Mild LVH, EF 55-60%, grade 1 diastolic dysfunction, mild aortic stenosis, mean gradient 14, mild LAE;  d. Echo 9/13: mod LVH, EF 50-55%, mild to mod AS, mean gradient 17 mmHg  . AF (paroxysmal atrial fibrillation)     Holter monitor 2.5 sec pauses  . Fatigue     CPX 11/09 VO2  14.4 (75% predicte)d, slope 35.  O2 pulse normal.  VO2 corrected for body weight 17.6.  Marland Kitchen Hypothyroidism   . Hyperlipidemia   . History of prostate cancer   . Allergic rhinitis   . DM (diabetes mellitus)     type II diet controlled  . Shingles    . Left carotid stenosis   . Vitamin B 12 deficiency   . Anemia   . Osteoarthritis   . Nephrolithiasis   . Obesity   . OSA (obstructive sleep apnea)     AHI 15 PSG 09/06/08  . Hx of colonoscopy   . Stroke   . Anxiety and depression   . PMR (polymyalgia rheumatica) 05/29/2012  . Hypogonadism male 05/29/2012  . Hyperparathyroidism 05/29/2012  . TIA (transient ischemic attack) 05/29/2012  . CKD (chronic kidney disease) stage 4, GFR 15-29 ml/min 05/29/2012  . COPD (chronic obstructive pulmonary disease) 05/29/2012  . Myocardial infarction   . Cancer     prostate ca history   Past Surgical History  Procedure Laterality Date  . Appendectomy    . Hernia repair    . Right carpal tunnel release    . Lumbar spine surgery    . Cystectomy      neck  . Prostatectomy    . Vasectomy    . Total knee arthroplasty      right  . Cholecystectomy      reports that he quit smoking about 54 years ago. His smoking use included Cigarettes. He has a 12.5 pack-year smoking history. He has quit using smokeless tobacco. He reports that he does not drink  alcohol or use illicit drugs. family history includes Alcohol abuse (age of onset: 24) in his father; Coronary artery disease in an unspecified family member; and Stroke (age of onset: 51) in his mother. Allergies  Allergen Reactions  . Bactrim (Sulfamethoxazole W-Trimethoprim) Other (See Comments)    hallucinations  . Horse-Derived Products Hives   Current Outpatient Prescriptions on File Prior to Visit  Medication Sig Dispense Refill  . albuterol (PROVENTIL HFA;VENTOLIN HFA) 108 (90 BASE) MCG/ACT inhaler Inhale 2 puffs into the lungs every 6 (six) hours as needed for wheezing or shortness of breath.  1 Inhaler  5  . Ascorbic Acid (VITAMIN C) 500 MG tablet Take 500 mg by mouth daily.        . cholecalciferol (VITAMIN D) 1000 UNITS tablet Take 1,000 Units by mouth daily.      . clopidogrel (PLAVIX) 75 MG tablet Take 1 tablet (75 mg total) by mouth daily.  90  tablet  3  . cyanocobalamin (,VITAMIN B-12,) 1000 MCG/ML injection 1 ml IM q 2 weeks  10 mL  5  . cyclobenzaprine (FLEXERIL) 10 MG tablet Take 5-10 mg by mouth 3 (three) times daily as needed. Muscle spasms.      Marland Kitchen diltiazem (CARDIZEM CD) 180 MG 24 hr capsule Take 1 capsule (180 mg total) by mouth daily.      Marland Kitchen docusate sodium 100 MG CAPS Take 100 mg by mouth 2 (two) times daily.  10 capsule    . folic acid (FOLVITE) 1 MG tablet Take 1 mg by mouth daily.      . furosemide (LASIX) 20 MG tablet Take 1 tablet (20 mg total) by mouth daily.  30 tablet  3  . gabapentin (NEURONTIN) 300 MG capsule Take 1 capsule (300 mg total) by mouth at bedtime.  30 capsule  5  . hyoscyamine (LEVSIN, ANASPAZ) 0.125 MG tablet Take 0.125 mg by mouth 4 (four) times daily as needed. Gas pain.      Marland Kitchen levothyroxine (SYNTHROID, LEVOTHROID) 137 MCG tablet Take 1 tablet (137 mcg total) by mouth daily.  90 tablet  3  . methotrexate (RHEUMATREX) 2.5 MG tablet Take 10 mg by mouth once a week. Takes 4 tablets weekly on  Sunday.   Caution:Chemotherapy. Protect from light.      . pravastatin (PRAVACHOL) 40 MG tablet TAKE ONE TABLET BY MOUTH EVERY DAY  30 tablet  5  . sertraline (ZOLOFT) 50 MG tablet Take 1 tablet (50 mg total) by mouth daily.  90 tablet  3  . solifenacin (VESICARE) 5 MG tablet Take 1 tablet (5 mg total) by mouth 2 (two) times daily.  60 tablet  11  . traMADol (ULTRAM) 50 MG tablet Take 1 tablet (50 mg total) by mouth every 6 (six) hours as needed for pain.  120 tablet  2  . traZODone (DESYREL) 100 MG tablet Take 100 mg by mouth at bedtime.      . [DISCONTINUED] solifenacin (VESICARE) 5 MG tablet Take 5 mg by mouth daily.         No current facility-administered medications on file prior to visit.   Review of Systems  Constitutional: Negative for unexpected weight change, or unusual diaphoresis  HENT: Negative for tinnitus.   Eyes: Negative for photophobia and visual disturbance.  Respiratory: Negative for choking  and stridor.   Gastrointestinal: Negative for vomiting and blood in stool.  Genitourinary: Negative for hematuria and decreased urine volume.  Musculoskeletal: Negative for acute joint swelling Skin: Negative for color  change and wound.  Neurological: Negative for tremors and numbness other than noted  Psychiatric/Behavioral: Negative for decreased concentration or  hyperactivity.       Objective:   Physical Exam BP 102/70  Pulse 93  Temp(Src) 97.4 F (36.3 C) (Oral)  Wt 197 lb (89.359 kg)  BMI 29.96 kg/m2  SpO2 90% VS noted, fatigued appearing with probable depressed affect, chronic ill Constitutional: Pt appears well-developed and well-nourished.  HENT: Head: NCAT.  Right Ear: External ear normal.  Left Ear: External ear normal.  Eyes: Conjunctivae and EOM are normal. Pupils are equal, round, and reactive to light.  Neck: Normal range of motion. Neck supple.  Cardiovascular: Normal rate and regular rhythm.  - does not seem irregular Pulmonary/Chest: Effort normal and breath sounds normal.  Shoulder and hips with diffuse tender Neurological: Pt is alert. Not confused  Skin: Skin is warm. No erythema.  Psychiatric: Pt behavior is normal. Thought content normal.     Assessment & Plan:

## 2013-03-04 NOTE — Assessment & Plan Note (Signed)
stable overall by history and exam, recent data reviewed with pt, and pt to continue medical treatment as before,  to f/u any worsening symptoms or concerns BP Readings from Last 3 Encounters:  03/04/13 102/70  01/21/13 112/72  12/30/12 124/56

## 2013-03-05 ENCOUNTER — Encounter: Payer: Self-pay | Admitting: Internal Medicine

## 2013-03-08 ENCOUNTER — Other Ambulatory Visit: Payer: Self-pay | Admitting: Internal Medicine

## 2013-03-10 NOTE — Telephone Encounter (Signed)
Done erx 

## 2013-03-12 ENCOUNTER — Ambulatory Visit (INDEPENDENT_AMBULATORY_CARE_PROVIDER_SITE_OTHER): Payer: Medicare Other

## 2013-03-12 DIAGNOSIS — E538 Deficiency of other specified B group vitamins: Secondary | ICD-10-CM

## 2013-03-12 MED ORDER — CYANOCOBALAMIN 1000 MCG/ML IJ SOLN
1000.0000 ug | Freq: Once | INTRAMUSCULAR | Status: AC
  Administered 2013-03-12: 1000 ug via INTRAMUSCULAR

## 2013-03-26 ENCOUNTER — Ambulatory Visit (INDEPENDENT_AMBULATORY_CARE_PROVIDER_SITE_OTHER): Payer: Medicare Other | Admitting: *Deleted

## 2013-03-26 DIAGNOSIS — E538 Deficiency of other specified B group vitamins: Secondary | ICD-10-CM

## 2013-03-26 MED ORDER — CYANOCOBALAMIN 1000 MCG/ML IJ SOLN
1000.0000 ug | Freq: Once | INTRAMUSCULAR | Status: AC
  Administered 2013-03-26: 1000 ug via INTRAMUSCULAR

## 2013-04-10 ENCOUNTER — Encounter: Payer: Self-pay | Admitting: Physician Assistant

## 2013-04-16 ENCOUNTER — Inpatient Hospital Stay (HOSPITAL_BASED_OUTPATIENT_CLINIC_OR_DEPARTMENT_OTHER)
Admission: EM | Admit: 2013-04-16 | Discharge: 2013-04-22 | DRG: 193 | Disposition: A | Discharge status: Rehabilitation Facility | Payer: Medicare Other | Attending: Internal Medicine | Admitting: Internal Medicine

## 2013-04-16 ENCOUNTER — Encounter (HOSPITAL_BASED_OUTPATIENT_CLINIC_OR_DEPARTMENT_OTHER): Payer: Self-pay | Admitting: Emergency Medicine

## 2013-04-16 ENCOUNTER — Emergency Department (HOSPITAL_BASED_OUTPATIENT_CLINIC_OR_DEPARTMENT_OTHER): Payer: Medicare Other

## 2013-04-16 DIAGNOSIS — I359 Nonrheumatic aortic valve disorder, unspecified: Secondary | ICD-10-CM | POA: Diagnosis present

## 2013-04-16 DIAGNOSIS — J9620 Acute and chronic respiratory failure, unspecified whether with hypoxia or hypercapnia: Secondary | ICD-10-CM | POA: Diagnosis present

## 2013-04-16 DIAGNOSIS — R5381 Other malaise: Secondary | ICD-10-CM | POA: Diagnosis not present

## 2013-04-16 DIAGNOSIS — Z9861 Coronary angioplasty status: Secondary | ICD-10-CM

## 2013-04-16 DIAGNOSIS — I440 Atrioventricular block, first degree: Secondary | ICD-10-CM | POA: Diagnosis present

## 2013-04-16 DIAGNOSIS — Z6379 Other stressful life events affecting family and household: Secondary | ICD-10-CM

## 2013-04-16 DIAGNOSIS — I480 Paroxysmal atrial fibrillation: Secondary | ICD-10-CM | POA: Diagnosis present

## 2013-04-16 DIAGNOSIS — Z87891 Personal history of nicotine dependence: Secondary | ICD-10-CM

## 2013-04-16 DIAGNOSIS — J449 Chronic obstructive pulmonary disease, unspecified: Secondary | ICD-10-CM | POA: Diagnosis present

## 2013-04-16 DIAGNOSIS — J4489 Other specified chronic obstructive pulmonary disease: Secondary | ICD-10-CM | POA: Diagnosis present

## 2013-04-16 DIAGNOSIS — J189 Pneumonia, unspecified organism: Principal | ICD-10-CM | POA: Diagnosis present

## 2013-04-16 DIAGNOSIS — G4733 Obstructive sleep apnea (adult) (pediatric): Secondary | ICD-10-CM

## 2013-04-16 DIAGNOSIS — M1990 Unspecified osteoarthritis, unspecified site: Secondary | ICD-10-CM | POA: Diagnosis present

## 2013-04-16 DIAGNOSIS — IMO0002 Reserved for concepts with insufficient information to code with codable children: Secondary | ICD-10-CM

## 2013-04-16 DIAGNOSIS — E039 Hypothyroidism, unspecified: Secondary | ICD-10-CM | POA: Diagnosis present

## 2013-04-16 DIAGNOSIS — R531 Weakness: Secondary | ICD-10-CM | POA: Diagnosis present

## 2013-04-16 DIAGNOSIS — Z91199 Patient's noncompliance with other medical treatment and regimen due to unspecified reason: Secondary | ICD-10-CM

## 2013-04-16 DIAGNOSIS — Z9119 Patient's noncompliance with other medical treatment and regimen: Secondary | ICD-10-CM

## 2013-04-16 DIAGNOSIS — Z79899 Other long term (current) drug therapy: Secondary | ICD-10-CM

## 2013-04-16 DIAGNOSIS — I2510 Atherosclerotic heart disease of native coronary artery without angina pectoris: Secondary | ICD-10-CM | POA: Diagnosis present

## 2013-04-16 DIAGNOSIS — Z96659 Presence of unspecified artificial knee joint: Secondary | ICD-10-CM

## 2013-04-16 DIAGNOSIS — J90 Pleural effusion, not elsewhere classified: Secondary | ICD-10-CM | POA: Diagnosis present

## 2013-04-16 DIAGNOSIS — N179 Acute kidney failure, unspecified: Secondary | ICD-10-CM

## 2013-04-16 DIAGNOSIS — I4891 Unspecified atrial fibrillation: Secondary | ICD-10-CM | POA: Diagnosis present

## 2013-04-16 DIAGNOSIS — F329 Major depressive disorder, single episode, unspecified: Secondary | ICD-10-CM

## 2013-04-16 DIAGNOSIS — I350 Nonrheumatic aortic (valve) stenosis: Secondary | ICD-10-CM

## 2013-04-16 DIAGNOSIS — I10 Essential (primary) hypertension: Secondary | ICD-10-CM

## 2013-04-16 DIAGNOSIS — N184 Chronic kidney disease, stage 4 (severe): Secondary | ICD-10-CM | POA: Diagnosis present

## 2013-04-16 DIAGNOSIS — I6529 Occlusion and stenosis of unspecified carotid artery: Secondary | ICD-10-CM | POA: Diagnosis present

## 2013-04-16 DIAGNOSIS — E213 Hyperparathyroidism, unspecified: Secondary | ICD-10-CM | POA: Diagnosis present

## 2013-04-16 DIAGNOSIS — E119 Type 2 diabetes mellitus without complications: Secondary | ICD-10-CM

## 2013-04-16 DIAGNOSIS — F32A Depression, unspecified: Secondary | ICD-10-CM

## 2013-04-16 DIAGNOSIS — E538 Deficiency of other specified B group vitamins: Secondary | ICD-10-CM | POA: Diagnosis present

## 2013-04-16 DIAGNOSIS — Z8546 Personal history of malignant neoplasm of prostate: Secondary | ICD-10-CM

## 2013-04-16 DIAGNOSIS — Z7902 Long term (current) use of antithrombotics/antiplatelets: Secondary | ICD-10-CM

## 2013-04-16 DIAGNOSIS — Z6830 Body mass index (BMI) 30.0-30.9, adult: Secondary | ICD-10-CM

## 2013-04-16 DIAGNOSIS — E669 Obesity, unspecified: Secondary | ICD-10-CM | POA: Diagnosis present

## 2013-04-16 DIAGNOSIS — I252 Old myocardial infarction: Secondary | ICD-10-CM

## 2013-04-16 DIAGNOSIS — Z823 Family history of stroke: Secondary | ICD-10-CM

## 2013-04-16 DIAGNOSIS — Z8249 Family history of ischemic heart disease and other diseases of the circulatory system: Secondary | ICD-10-CM

## 2013-04-16 DIAGNOSIS — I129 Hypertensive chronic kidney disease with stage 1 through stage 4 chronic kidney disease, or unspecified chronic kidney disease: Secondary | ICD-10-CM | POA: Diagnosis present

## 2013-04-16 DIAGNOSIS — E785 Hyperlipidemia, unspecified: Secondary | ICD-10-CM | POA: Diagnosis present

## 2013-04-16 DIAGNOSIS — Z8673 Personal history of transient ischemic attack (TIA), and cerebral infarction without residual deficits: Secondary | ICD-10-CM

## 2013-04-16 DIAGNOSIS — R0902 Hypoxemia: Secondary | ICD-10-CM | POA: Diagnosis present

## 2013-04-16 DIAGNOSIS — Z9852 Vasectomy status: Secondary | ICD-10-CM

## 2013-04-16 LAB — CBC WITH DIFFERENTIAL/PLATELET
Eosinophils Absolute: 0 10*3/uL (ref 0.0–0.7)
Eosinophils Relative: 1 % (ref 0–5)
Hemoglobin: 12.5 g/dL — ABNORMAL LOW (ref 13.0–17.0)
Lymphs Abs: 1 10*3/uL (ref 0.7–4.0)
MCH: 33.2 pg (ref 26.0–34.0)
MCV: 100.8 fL — ABNORMAL HIGH (ref 78.0–100.0)
Monocytes Relative: 13 % — ABNORMAL HIGH (ref 3–12)
RBC: 3.76 MIL/uL — ABNORMAL LOW (ref 4.22–5.81)

## 2013-04-16 LAB — LACTIC ACID, PLASMA: Lactic Acid, Venous: 1.3 mmol/L (ref 0.5–2.2)

## 2013-04-16 MED ORDER — ACETAMINOPHEN 325 MG PO TABS
650.0000 mg | ORAL_TABLET | Freq: Once | ORAL | Status: AC
  Administered 2013-04-16: 650 mg via ORAL
  Filled 2013-04-16: qty 2

## 2013-04-16 NOTE — Procedures (Signed)
Asked to assist with new patient to ED. Patient O2 saturations upon entering room were 82% trending. Placed patient on a non-rebreather. After saturations increased to above 90% placed patient on a venti-mask with FI02 35% on 9 liters oxygen. Patient maintaining oxygen saturations of 94%. Will continue to follow patient and make necessary changes.

## 2013-04-16 NOTE — ED Notes (Addendum)
Pt lives at home with wife.  Fell from sitting on side of bed twice tonight while trying to urinate.  Has been having weakness, chills, loose stools x 1 week. Nausea today but no vomiting. Zofran 4mg  IV given by EMS enroute.

## 2013-04-17 ENCOUNTER — Inpatient Hospital Stay (HOSPITAL_COMMUNITY): Payer: Medicare Other

## 2013-04-17 ENCOUNTER — Ambulatory Visit: Payer: Medicare Other

## 2013-04-17 DIAGNOSIS — J189 Pneumonia, unspecified organism: Secondary | ICD-10-CM | POA: Diagnosis present

## 2013-04-17 DIAGNOSIS — R5381 Other malaise: Secondary | ICD-10-CM

## 2013-04-17 DIAGNOSIS — J90 Pleural effusion, not elsewhere classified: Secondary | ICD-10-CM | POA: Diagnosis present

## 2013-04-17 DIAGNOSIS — IMO0002 Reserved for concepts with insufficient information to code with codable children: Secondary | ICD-10-CM

## 2013-04-17 DIAGNOSIS — N184 Chronic kidney disease, stage 4 (severe): Secondary | ICD-10-CM

## 2013-04-17 DIAGNOSIS — I359 Nonrheumatic aortic valve disorder, unspecified: Secondary | ICD-10-CM

## 2013-04-17 DIAGNOSIS — J449 Chronic obstructive pulmonary disease, unspecified: Secondary | ICD-10-CM

## 2013-04-17 DIAGNOSIS — I4891 Unspecified atrial fibrillation: Secondary | ICD-10-CM

## 2013-04-17 DIAGNOSIS — G4733 Obstructive sleep apnea (adult) (pediatric): Secondary | ICD-10-CM

## 2013-04-17 LAB — BLOOD GAS, ARTERIAL
Acid-Base Excess: 2.4 mmol/L — ABNORMAL HIGH (ref 0.0–2.0)
O2 Saturation: 91.5 %
Patient temperature: 98.6
TCO2: 29 mmol/L (ref 0–100)
pH, Arterial: 7.351 (ref 7.350–7.450)

## 2013-04-17 LAB — COMPREHENSIVE METABOLIC PANEL WITH GFR
ALT: 16 U/L (ref 0–53)
AST: 26 U/L (ref 0–37)
Albumin: 3.1 g/dL — ABNORMAL LOW (ref 3.5–5.2)
Alkaline Phosphatase: 63 U/L (ref 39–117)
BUN: 16 mg/dL (ref 6–23)
CO2: 29 meq/L (ref 19–32)
Calcium: 8.8 mg/dL (ref 8.4–10.5)
Chloride: 101 meq/L (ref 96–112)
Creatinine, Ser: 1.4 mg/dL — ABNORMAL HIGH (ref 0.50–1.35)
GFR calc Af Amer: 50 mL/min — ABNORMAL LOW
GFR calc non Af Amer: 43 mL/min — ABNORMAL LOW
Glucose, Bld: 134 mg/dL — ABNORMAL HIGH (ref 70–99)
Potassium: 3.9 meq/L (ref 3.5–5.1)
Sodium: 138 meq/L (ref 135–145)
Total Bilirubin: 0.3 mg/dL (ref 0.3–1.2)
Total Protein: 6.1 g/dL (ref 6.0–8.3)

## 2013-04-17 LAB — URINALYSIS, ROUTINE W REFLEX MICROSCOPIC
Glucose, UA: NEGATIVE mg/dL
Ketones, ur: NEGATIVE mg/dL
pH: 6.5 (ref 5.0–8.0)

## 2013-04-17 LAB — CBC WITH DIFFERENTIAL/PLATELET
Basophils Absolute: 0 10*3/uL (ref 0.0–0.1)
Basophils Relative: 0 % (ref 0–1)
HCT: 36.1 % — ABNORMAL LOW (ref 39.0–52.0)
MCHC: 33.5 g/dL (ref 30.0–36.0)
Monocytes Absolute: 0.9 10*3/uL (ref 0.1–1.0)
Neutro Abs: 3.2 10*3/uL (ref 1.7–7.7)
Platelets: 291 10*3/uL (ref 150–400)
RDW: 15.7 % — ABNORMAL HIGH (ref 11.5–15.5)
WBC: 6.6 10*3/uL (ref 4.0–10.5)

## 2013-04-17 LAB — BASIC METABOLIC PANEL
Calcium: 8.6 mg/dL (ref 8.4–10.5)
Chloride: 103 mEq/L (ref 96–112)
Creatinine, Ser: 1.45 mg/dL — ABNORMAL HIGH (ref 0.50–1.35)
GFR calc Af Amer: 48 mL/min — ABNORMAL LOW (ref >90)
Sodium: 139 mEq/L (ref 135–145)

## 2013-04-17 LAB — URINE MICROSCOPIC-ADD ON

## 2013-04-17 LAB — STREP PNEUMONIAE URINARY ANTIGEN: Strep Pneumo Urinary Antigen: NEGATIVE

## 2013-04-17 LAB — CORTISOL: Cortisol, Plasma: 7.1 ug/dL

## 2013-04-17 MED ORDER — LEVOTHYROXINE SODIUM 137 MCG PO TABS
137.0000 ug | ORAL_TABLET | Freq: Every day | ORAL | Status: DC
  Administered 2013-04-17 – 2013-04-22 (×6): 137 ug via ORAL
  Filled 2013-04-17 (×7): qty 1

## 2013-04-17 MED ORDER — SODIUM CHLORIDE 0.9 % IJ SOLN: 3.0000 mL | INTRAMUSCULAR | Status: DC | PRN

## 2013-04-17 MED ORDER — SODIUM CHLORIDE 0.9 % IJ SOLN
3.0000 mL | Freq: Two times a day (BID) | INTRAMUSCULAR | Status: DC
  Administered 2013-04-17 – 2013-04-21 (×9): 3 mL via INTRAVENOUS

## 2013-04-17 MED ORDER — ACETAMINOPHEN 325 MG PO TABS
650.0000 mg | ORAL_TABLET | ORAL | Status: DC | PRN
  Administered 2013-04-17 – 2013-04-20 (×4): 650 mg via ORAL
  Filled 2013-04-17 (×4): qty 2

## 2013-04-17 MED ORDER — SODIUM CHLORIDE 0.9 % IV SOLN
1250.0000 mg | INTRAVENOUS | Status: DC
  Administered 2013-04-17 – 2013-04-19 (×3): 1250 mg via INTRAVENOUS
  Filled 2013-04-17 (×3): qty 1250

## 2013-04-17 MED ORDER — SODIUM CHLORIDE 0.9 % IV BOLUS (SEPSIS)
500.0000 mL | Freq: Once | INTRAVENOUS | Status: AC
  Administered 2013-04-17: 500 mL via INTRAVENOUS

## 2013-04-17 MED ORDER — SODIUM CHLORIDE 0.9 % IV SOLN
250.0000 mL | INTRAVENOUS | Status: DC | PRN
  Administered 2013-04-17: 250 mL via INTRAVENOUS

## 2013-04-17 MED ORDER — ENOXAPARIN SODIUM 40 MG/0.4ML ~~LOC~~ SOLN
40.0000 mg | SUBCUTANEOUS | Status: DC
  Administered 2013-04-17 – 2013-04-22 (×6): 40 mg via SUBCUTANEOUS
  Filled 2013-04-17 (×6): qty 0.4

## 2013-04-17 MED ORDER — ALBUTEROL SULFATE HFA 108 (90 BASE) MCG/ACT IN AERS
2.0000 | INHALATION_SPRAY | Freq: Four times a day (QID) | RESPIRATORY_TRACT | Status: DC | PRN
  Filled 2013-04-17: qty 6.7

## 2013-04-17 MED ORDER — DEXTROSE 5 % IV SOLN
1.0000 g | Freq: Once | INTRAVENOUS | Status: AC
  Administered 2013-04-17: 1 g via INTRAVENOUS
  Filled 2013-04-17: qty 10

## 2013-04-17 MED ORDER — CLOPIDOGREL BISULFATE 75 MG PO TABS
75.0000 mg | ORAL_TABLET | Freq: Every day | ORAL | Status: DC
  Administered 2013-04-17 – 2013-04-22 (×6): 75 mg via ORAL
  Filled 2013-04-17 (×6): qty 1

## 2013-04-17 MED ORDER — DEXTROSE 5 % IV SOLN
1.0000 g | INTRAVENOUS | Status: DC
  Administered 2013-04-17 – 2013-04-20 (×4): 1 g via INTRAVENOUS
  Filled 2013-04-17 (×5): qty 10

## 2013-04-17 MED ORDER — DEXTROSE 5 % IV SOLN
500.0000 mg | INTRAVENOUS | Status: DC
  Administered 2013-04-17 – 2013-04-19 (×3): 500 mg via INTRAVENOUS
  Filled 2013-04-17 (×4): qty 500

## 2013-04-17 MED ORDER — FUROSEMIDE 20 MG PO TABS
20.0000 mg | ORAL_TABLET | Freq: Every day | ORAL | Status: DC
  Administered 2013-04-17 – 2013-04-22 (×6): 20 mg via ORAL
  Filled 2013-04-17 (×6): qty 1

## 2013-04-17 MED ORDER — SODIUM CHLORIDE 0.9 % IV BOLUS (SEPSIS)
1000.0000 mL | Freq: Once | INTRAVENOUS | Status: AC
  Administered 2013-04-17: 1000 mL via INTRAVENOUS

## 2013-04-17 MED ORDER — DEXTROSE 5 % IV SOLN
500.0000 mg | Freq: Once | INTRAVENOUS | Status: AC
  Administered 2013-04-17: 500 mg via INTRAVENOUS
  Filled 2013-04-17: qty 500

## 2013-04-17 MED ORDER — DILTIAZEM HCL ER COATED BEADS 180 MG PO CP24
180.0000 mg | ORAL_CAPSULE | Freq: Every day | ORAL | Status: DC
  Administered 2013-04-17 – 2013-04-22 (×6): 180 mg via ORAL
  Filled 2013-04-17 (×6): qty 1

## 2013-04-17 NOTE — Progress Notes (Signed)
[facility] PROGRESS NOTE  [name] ZOX:[id number] DOB: Mar 28, 1923 DOA: 04/16/2013 PCP: [name], MD  Brief narrative: [name] is an 79 y.o. male with a PMH of chronic respiratory failure, noncompliant with home oxygen, obstructive sleep apnea, right-sided pleural effusion, aortic stenosis, atrial fibrillation, and chronic kidney disease who presented to the [facility] ER with fever, cough, and hypoxia on 04/17/2013. He was febrile on admission with a temperature of greater than 102. Chest radiograph showed worsening right-sided pleural effusion.  Assessment/Plan: Principal Problem:   PNA (pneumonia) with acute on chronic respiratory failure / COPD / pleural effusion -On empiric vancomycin, Rocephin, and azithromycin. -Thoracentesis requested. We'll send fluid for culture. -Followup blood, sputum cultures and strep pneumonia/Legionella urinary antigens. -Continues to have high oxygen requirement on a nonrebreather mask. ABG showed hypoxia and hypercarbia. -Continue bronchodilator therapy as needed. Active Problems:   AORTIC STENOSIS/ INSUFFICIENCY, NON-RHEUMATIC -No evidence of CHF.   AF (paroxysmal atrial fibrillation) -Rate controlled on Cardizem.   CKD (chronic kidney disease) stage 4, GFR 15-29 ml/min -Creatinine stable.   General weakness -PT/OT evaluations when stable.   Hypothyroidism -Continue Synthroid  Code Status: Full. Family Communication: No family at bedside. Disposition Plan: Home when stable.   Medical Consultants:  None.  Other Consultants:  IR  Anti-infectives:  Vancomycin 04/17/13--->  Azithromycin 04/17/13--->  Rocephin 04/17/13--->  HPI/Subjective: [name] says he is not feeling short of breath despite having a non-rebreather mask on.  Denies pain.  No nausea or vomiting.    Objective: Filed Vitals:   04/16/13 2335 04/17/13 0131 04/17/13 0231 04/17/13 0332  BP: 97/55 98/57 88/48  96/58  Pulse: 85 80 76 75  Temp:  98.7 F  (37.1 C)  98.4 F (36.9 C)  TempSrc:  Oral  Oral  Resp: 14 19 20 20   Height:    5\' 8"  (1.727 m)  Weight:    90 kg (198 lb 6.6 oz)  SpO2: 91% 92% 91% 95%   No intake or output data in the 24 hours ending 04/17/13 1058  Exam: Gen:  NAD Cardiovascular:  RRR, No M/R/G Respiratory:  Lungs diminished Gastrointestinal:  Abdomen soft, NT/ND, + BS Extremities: 1+ edema  Data Reviewed: Basic Metabolic Panel:  Recent Labs Lab 04/16/13 2327 04/17/13 0657  NA 138 139  K 3.9 4.2  CL 101 103  CO2 29 26  GLUCOSE 134* 99  BUN 16 16  CREATININE 1.40* 1.45*  CALCIUM 8.8 8.6   GFR Estimated Creatinine Clearance: 38.4 ml/min (by C-G formula based on Cr of 1.45). Liver Function Tests:  Recent Labs Lab 04/16/13 2327  AST 26  ALT 16  ALKPHOS 63  BILITOT 0.3  PROT 6.1  ALBUMIN 3.1*    CBC:  Recent Labs Lab 04/16/13 2327 04/17/13 0657  WBC 7.8 6.6  NEUTROABS 5.7 3.2  HGB 12.5* 12.1*  HCT 37.9* 36.1*  MCV 100.8* 97.8  PLT 305 291   Cardiac Enzymes:  Recent Labs Lab 04/16/13 2327  TROPONINI <0.30   BNP (last 3 results)  Recent Labs  12/24/12 1131 12/24/12 1518 04/16/13 2327  PROBNP 2861.0* 2788.0* 1599.0*   Microbiology No results found for this or any previous visit (from the past 240 hour(s)).   Procedures and Diagnostic Studies: Dg Chest Port 1 View  04/17/2013  *RADIOLOGY REPORT*  Clinical Data: Weakness.  Chills.  Status post fall.  PORTABLE CHEST - 1 VIEW  Comparison: 12/25/2012  Findings: Midline trachea.  Patient rotated to the right.  Moderate cardiomegaly.  A moderate right pleural effusion is increased since prior exam.  There is likely a small left pleural effusion. No pneumothorax.  No congestive failure.  Right base air space disease is increased.  Minimal left base subsegmental atelectasis.  IMPRESSION: Increased moderate right pleural effusion with adjacent atelectasis or infection. Recommend radiographic follow-up until clearing.  Minimal left  base subsegmental atelectasis.  Possible small left pleural effusion.  Cardiomegaly without congestive failure.  No post-traumatic deformity identified.   Original Report Authenticated By: Jeronimo Greaves, M.D.     Scheduled Meds: . azithromycin  500 mg Intravenous Q24H  . cefTRIAXone (ROCEPHIN)  IV  1 g Intravenous Q24H  . clopidogrel  75 mg Oral Daily  . diltiazem  180 mg Oral Daily  . enoxaparin (LOVENOX) injection  40 mg Subcutaneous Q24H  . furosemide  20 mg Oral Daily  . levothyroxine  137 mcg Oral Q breakfast  . sodium chloride  3 mL Intravenous Q12H  . vancomycin  1,250 mg Intravenous Q24H   Continuous Infusions:   Time spent: 35 minutes.   LOS: 1 day   Earlee Herald  [facility] Pager [phone].  If 8PM-8AM, please contact night-coverage at [email or web address], password [facility] 04/17/2013, 10:58 AM

## 2013-04-17 NOTE — Procedures (Addendum)
Successful US guided right thoracentesis. Yielded 1.1L of clear yellow fluid. Pt tolerated procedure well. No immediate complications.  Specimen was sent for labs. Initial post procedure CXR showed PTX, likely ex vacuo, see full CXR report for details.  Brayton El PA-C 04/17/2013 1:58 PM   follow up CXR shows stable (R)PTX. Pt stable Will check another CXR in am.  Barbee Shropshire, KEVINPA-C 4:11 PM

## 2013-04-17 NOTE — H&P (Signed)
PCP:   [name], MD   Chief Complaint:  Fever, cough  HPI: 79 yo male h/o chronic resp failure suppose to be on home oxygen but noncompliant, osa, rt sided pleural effusion for over 3 months, aortic stenosis, afib, ckd presented [facility] with fever, cough and not feeling well for 4 days.  On arrival oxygen sats were 82% and report per ED provider was pt was awake, able to speak in full sentences and mentating normally.  Pt was given iv rocephin and azithro.  bp was stable.  Febrile temp over 102.  Pt on telemetry floor at [facility] and arouses to voice but is somunlent.  No sedatives were given at [facility] center that i can find.  On VM 40% with sats around 90.  abg not done.  cxr shows worse rt sided pleural effusion.  Pt denies any pain, is not tachypneic but is somulent with good color.  Review of Systems:  Positive and negative as per HPI otherwise all other systems are negative  Past Medical History: Past Medical History  Diagnosis Date  . CAD (coronary artery disease)     s/p NSTEMI and BMS stent diagonal07/09;  Lexiscan Myoview 9/13:  EF 62%, no ischemia  . AS (aortic stenosis)     a.  Echo 2009 mean gradient 9mm HG. AVA 2.18;  b.  Echo 4/11 mild AS mean gradient 10mm HG;  c.  Echo 04/2011: Mild LVH, EF 55-60%, grade 1 diastolic dysfunction, mild aortic stenosis, mean gradient 14, mild LAE;  d. Echo 9/13: mod LVH, EF 50-55%, mild to mod AS, mean gradient 17 mmHg  . AF (paroxysmal atrial fibrillation)     Holter monitor 2.5 sec pauses  . Fatigue     CPX 11/09 VO2  14.4 (75% predicte)d, slope 35.  O2 pulse normal.  VO2 corrected for body weight 17.6.  Marland Kitchen Hypothyroidism   . Hyperlipidemia   . History of prostate cancer   . Allergic rhinitis   . DM (diabetes mellitus)     type II diet controlled  . Shingles   . Left carotid stenosis   . Vitamin B 12 deficiency   . Anemia   . Osteoarthritis   . Nephrolithiasis   . Obesity   . OSA (obstructive sleep apnea)      AHI 15 PSG 09/06/08  . Hx of colonoscopy   . Stroke   . Anxiety and depression   . PMR (polymyalgia rheumatica) 05/29/2012  . Hypogonadism male 05/29/2012  . Hyperparathyroidism 05/29/2012  . TIA (transient ischemic attack) 05/29/2012  . CKD (chronic kidney disease) stage 4, GFR 15-29 ml/min 05/29/2012  . COPD (chronic obstructive pulmonary disease) 05/29/2012  . Myocardial infarction   . Cancer     prostate ca history   Past Surgical History  Procedure Laterality Date  . Appendectomy    . Hernia repair    . Right carpal tunnel release    . Lumbar spine surgery    . Cystectomy      neck  . Prostatectomy    . Vasectomy    . Total knee arthroplasty      right  . Cholecystectomy      Medications: Prior to Admission medications   Medication Sig Start Date End Date Taking? Authorizing Provider  albuterol (PROVENTIL HFA;VENTOLIN HFA) 108 (90 BASE) MCG/ACT inhaler Inhale 2 puffs into the lungs every 6 (six) hours as needed for wheezing or shortness of breath. 11/29/12   [name],  MD  Ascorbic Acid (VITAMIN C) 500 MG tablet Take 500 mg by mouth daily.      Historical Provider, MD  cholecalciferol (VITAMIN D) 1000 UNITS tablet Take 1,000 Units by mouth daily.    Historical Provider, MD  clopidogrel (PLAVIX) 75 MG tablet Take 1 tablet (75 mg total) by mouth daily. 09/12/12   [name], MD  cyanocobalamin (,VITAMIN B-12,) 1000 MCG/ML injection 1 ml IM q 2 weeks 02/20/13   [name], MD  cyclobenzaprine (FLEXERIL) 10 MG tablet Take 5-10 mg by mouth 3 (three) times daily as needed. Muscle spasms.    Historical Provider, MD  diltiazem (CARDIZEM CD) 180 MG 24 hr capsule Take 1 capsule (180 mg total) by mouth daily. 12/30/12   Leroy Sea, MD  docusate sodium 100 MG CAPS Take 100 mg by mouth 2 (two) times daily. 12/30/12   Leroy Sea, MD  folic acid (FOLVITE) 1 MG tablet Take 1 mg by mouth daily.    Historical Provider, MD  furosemide (LASIX) 20 MG tablet Take 1 tablet (20 mg total) by  mouth daily. 12/30/12   Leroy Sea, MD  gabapentin (NEURONTIN) 300 MG capsule TAKE ONE CAPSULE BY MOUTH AT BEDTIME 03/08/13   [name], MD  hyoscyamine (LEVSIN, ANASPAZ) 0.125 MG tablet Take 0.125 mg by mouth 4 (four) times daily as needed. Gas pain.    Historical Provider, MD  levothyroxine (SYNTHROID, LEVOTHROID) 137 MCG tablet Take 1 tablet (137 mcg total) by mouth daily. 09/12/12   [name], MD  methotrexate (RHEUMATREX) 2.5 MG tablet Take 10 mg by mouth once a week. Takes 4 tablets weekly on  Sunday.   Caution:Chemotherapy. Protect from light.    Historical Provider, MD  pravastatin (PRAVACHOL) 40 MG tablet TAKE ONE TABLET BY MOUTH EVERY DAY 01/03/13   [name], MD  sertraline (ZOLOFT) 50 MG tablet Take 1 tablet (50 mg total) by mouth daily. 01/29/13   [name], MD  solifenacin (VESICARE) 5 MG tablet Take 1 tablet (5 mg total) by mouth 2 (two) times daily. 01/21/13   [name], MD  traMADol (ULTRAM) 50 MG tablet Take 1 tablet (50 mg total) by mouth every 6 (six) hours as needed for pain. 01/21/13   [name], MD  traZODone (DESYREL) 100 MG tablet Take 100 mg by mouth at bedtime.    Historical Provider, MD    Allergies:   Allergies  Allergen Reactions  . Bactrim (Sulfamethoxazole W-Trimethoprim) Other (See Comments)    hallucinations  . Horse-Derived Products Hives    Social History:  reports that he quit smoking about 54 years ago. His smoking use included Cigarettes. He has a 12.5 pack-year smoking history. He has quit using smokeless tobacco. He reports that he does not drink alcohol or use illicit drugs.  Family History: Family History  Problem Relation Age of Onset  . Stroke Mother 63  . Coronary artery disease      father, brother, sister  . Alcohol abuse Father 22    Physical Exam: Filed Vitals:   04/16/13 2335 04/17/13 0131 04/17/13 0231 04/17/13 0332  BP: 97/55 98/57 88/48  96/58  Pulse: 85 80 76 75  Temp:  98.7 F (37.1 C)  98.4 F (36.9 C)   TempSrc:  Oral  Oral  Resp: 14 19 20 20   Height:    5\' 8"  (1.727 m)  Weight:    90 kg (198 lb 6.6 oz)  SpO2: 91% 92%  91% 95%   General appearance: no distress and slowed mentation Head: Normocephalic, without obvious abnormality, atraumatic Eyes: negative Nose: Nares normal. Septum midline. Mucosa normal. No drainage or sinus tenderness. Neck: no JVD and supple, symmetrical, trachea midline Lungs: diminished breath sounds RLL Heart: regular rate and rhythm, S1, S2 normal, no murmur, click, rub or gallop Abdomen: soft, non-tender; bowel sounds normal; no masses,  no organomegaly Extremities: extremities normal, atraumatic, no cyanosis or edema Pulses: 2+ and symmetric Skin: Skin color, texture, turgor normal. No rashes or lesions Neurologic: Cranial nerves: normal moves all extremeties.  somulent    Labs on Admission:   Recent Labs  04/16/13 2327  NA 138  K 3.9  CL 101  CO2 29  GLUCOSE 134*  BUN 16  CREATININE 1.40*  CALCIUM 8.8    Recent Labs  04/16/13 2327  AST 26  ALT 16  ALKPHOS 63  BILITOT 0.3  PROT 6.1  ALBUMIN 3.1*    Recent Labs  04/16/13 2327  WBC 7.8  NEUTROABS 5.7  HGB 12.5*  HCT 37.9*  MCV 100.8*  PLT 305    Recent Labs  04/16/13 2327  TROPONINI <0.30   Radiological Exams on Admission: Dg Chest Port 1 View  04/17/2013  *RADIOLOGY REPORT*  Clinical Data: Weakness.  Chills.  Status post fall.  PORTABLE CHEST - 1 VIEW  Comparison: 12/25/2012  Findings: Midline trachea.  Patient rotated to the right.  Moderate cardiomegaly.  A moderate right pleural effusion is increased since prior exam.  There is likely a small left pleural effusion. No pneumothorax.  No congestive failure.  Right base air space disease is increased.  Minimal left base subsegmental atelectasis.  IMPRESSION: Increased moderate right pleural effusion with adjacent atelectasis or infection. Recommend radiographic follow-up until clearing.  Minimal left base subsegmental  atelectasis.  Possible small left pleural effusion.  Cardiomegaly without congestive failure.  No post-traumatic deformity identified.   Original Report Authenticated By: Jeronimo Greaves, M.D.     Assessment/Plan  79 yo male with probable pna, h/o osa/copd/noncompliant with home oxygen  Principal Problem:   PNA (pneumonia) Active Problems:   AORTIC STENOSIS/ INSUFFICIENCY, NON-RHEUMATIC   AF (paroxysmal atrial fibrillation)   CKD (chronic kidney disease) stage 4, GFR 15-29 ml/min   COPD (chronic obstructive pulmonary disease)   General weakness   Pleural effusion  Broaden abx to include vancomycin with rocephin/azithro.  Cont oxygen supplementation, ck stat abg.   Not currently on steriods.  No wheezing on exam, will hold off on steroids at this time.  Need to clarify home meds.  Presumptive full code.  freq neuro checks.  May need ct chest in future if cr improves to better ascertain this chronic effusion.  Hoyte Ziebell A 04/17/2013, 4:49 AM

## 2013-04-17 NOTE — ED Provider Notes (Addendum)
History     CSN: [id number]  Arrival date & time 04/16/13  2259   First MD Initiated Contact with Patient 04/17/13 0112      Chief Complaint  Patient presents with  . Weakness    (Consider location/radiation/quality/duration/timing/severity/associated sxs/prior treatment) HPI This is an 79 year old male who states he overdid it 4 days ago by mowing the lawn. He has been sick since then with generalized weakness, chills, shortness of breath and fever. This weakness got so severe yesterday evening that he slipped out of bed twice. He denies injury or pain. He has been short of breath and was noted to have an oxygen saturation of 82% on room air on arrival. He was placed on oxygen with improvement in his oxygen saturation. He was also noted to be febrile on arrival with a temperature of 102.7 and he was given Tylenol for this on arrival; the Tylenol also relieved the headache he had on arrival. He has had some nausea but denies vomiting. He has had loose stools. His son states he is supposed to be on oxygen for the past 2 years but has been noncompliant with this. He denies chest pain or abdominal pain.   Past Medical History  Diagnosis Date  . CAD (coronary artery disease)     s/p NSTEMI and BMS stent diagonal07/09;  Lexiscan Myoview 9/13:  EF 62%, no ischemia  . AS (aortic stenosis)     a.  Echo 2009 mean gradient 9mm HG. AVA 2.18;  b.  Echo 4/11 mild AS mean gradient 10mm HG;  c.  Echo 04/2011: Mild LVH, EF 55-60%, grade 1 diastolic dysfunction, mild aortic stenosis, mean gradient 14, mild LAE;  d. Echo 9/13: mod LVH, EF 50-55%, mild to mod AS, mean gradient 17 mmHg  . AF (paroxysmal atrial fibrillation)     Holter monitor 2.5 sec pauses  . Fatigue     CPX 11/09 VO2  14.4 (75% predicte)d, slope 35.  O2 pulse normal.  VO2 corrected for body weight 17.6.  Marland Kitchen Hypothyroidism   . Hyperlipidemia   . History of prostate cancer   . Allergic rhinitis   . DM (diabetes mellitus)     type II diet  controlled  . Shingles   . Left carotid stenosis   . Vitamin B 12 deficiency   . Anemia   . Osteoarthritis   . Nephrolithiasis   . Obesity   . OSA (obstructive sleep apnea)     AHI 15 PSG 09/06/08  . Hx of colonoscopy   . Stroke   . Anxiety and depression   . PMR (polymyalgia rheumatica) 05/29/2012  . Hypogonadism male 05/29/2012  . Hyperparathyroidism 05/29/2012  . TIA (transient ischemic attack) 05/29/2012  . CKD (chronic kidney disease) stage 4, GFR 15-29 ml/min 05/29/2012  . COPD (chronic obstructive pulmonary disease) 05/29/2012  . Myocardial infarction   . Cancer     prostate ca history    Past Surgical History  Procedure Laterality Date  . Appendectomy    . Hernia repair    . Right carpal tunnel release    . Lumbar spine surgery    . Cystectomy      neck  . Prostatectomy    . Vasectomy    . Total knee arthroplasty      right  . Cholecystectomy      Family History  Problem Relation Age of Onset  . Stroke Mother 82  . Coronary artery disease      father, brother, sister  .  Alcohol abuse Father 51    History  Substance Use Topics  . Smoking status: Former Smoker -- 0.50 packs/day for 25 years    Types: Cigarettes    Quit date: 12/25/1958  . Smokeless tobacco: Former Neurosurgeon     Comment: quit in 1950s  . Alcohol Use: No      Review of Systems  All other systems reviewed and are negative.    Allergies  Bactrim and Horse-derived products  Home Medications   Current Outpatient Rx  Name  Route  Sig  Dispense  Refill  . albuterol (PROVENTIL HFA;VENTOLIN HFA) 108 (90 BASE) MCG/ACT inhaler   Inhalation   Inhale 2 puffs into the lungs every 6 (six) hours as needed for wheezing or shortness of breath.   1 Inhaler   5   . Ascorbic Acid (VITAMIN C) 500 MG tablet   Oral   Take 500 mg by mouth daily.           . cholecalciferol (VITAMIN D) 1000 UNITS tablet   Oral   Take 1,000 Units by mouth daily.         . clopidogrel (PLAVIX) 75 MG tablet   Oral    Take 1 tablet (75 mg total) by mouth daily.   90 tablet   3   . cyanocobalamin (,VITAMIN B-12,) 1000 MCG/ML injection      1 ml IM q 2 weeks   10 mL   5   . cyclobenzaprine (FLEXERIL) 10 MG tablet   Oral   Take 5-10 mg by mouth 3 (three) times daily as needed. Muscle spasms.         Marland Kitchen diltiazem (CARDIZEM CD) 180 MG 24 hr capsule   Oral   Take 1 capsule (180 mg total) by mouth daily.         Marland Kitchen docusate sodium 100 MG CAPS   Oral   Take 100 mg by mouth 2 (two) times daily.   10 capsule      . folic acid (FOLVITE) 1 MG tablet   Oral   Take 1 mg by mouth daily.         . furosemide (LASIX) 20 MG tablet   Oral   Take 1 tablet (20 mg total) by mouth daily.   30 tablet   3   . gabapentin (NEURONTIN) 300 MG capsule      TAKE ONE CAPSULE BY MOUTH AT BEDTIME   30 capsule   5   . hyoscyamine (LEVSIN, ANASPAZ) 0.125 MG tablet   Oral   Take 0.125 mg by mouth 4 (four) times daily as needed. Gas pain.         Marland Kitchen levothyroxine (SYNTHROID, LEVOTHROID) 137 MCG tablet   Oral   Take 1 tablet (137 mcg total) by mouth daily.   90 tablet   3   . methotrexate (RHEUMATREX) 2.5 MG tablet   Oral   Take 10 mg by mouth once a week. Takes 4 tablets weekly on  Sunday.   Caution:Chemotherapy. Protect from light.         . pravastatin (PRAVACHOL) 40 MG tablet      TAKE ONE TABLET BY MOUTH EVERY DAY   30 tablet   5   . sertraline (ZOLOFT) 50 MG tablet   Oral   Take 1 tablet (50 mg total) by mouth daily.   90 tablet   3   . solifenacin (VESICARE) 5 MG tablet   Oral   Take 1 tablet (5  mg total) by mouth 2 (two) times daily.   60 tablet   11   . traMADol (ULTRAM) 50 MG tablet   Oral   Take 1 tablet (50 mg total) by mouth every 6 (six) hours as needed for pain.   120 tablet   2   . traZODone (DESYREL) 100 MG tablet   Oral   Take 100 mg by mouth at bedtime.           BP 97/55  Pulse 85  Temp(Src) 102.7 F (39.3 C) (Oral)  Resp 14  Ht 5\' 8"  (1.727 m)  Wt  200 lb (90.719 kg)  BMI 30.42 kg/m2  SpO2 91%  Physical Exam General: Well-developed, well-nourished male in no acute distress; appearance consistent with age of record HENT: normocephalic, atraumatic Eyes: pupils equal round and reactive to light; extraocular muscles intact Neck: supple Heart: regular rate and rhythm; distant sounds Lungs: decreased sounds right base Abdomen: soft; nondistended; nontender; bowel sounds present Extremities: arthritic changes Neurologic: Awake, alert and oriented; motor function intact in all extremities and symmetric; no facial droop Skin: Warm and dry Psychiatric: Normal mood and affect    ED Course  Procedures (including critical care time)    MDM   Nursing notes and vitals signs, including pulse oximetry, reviewed.  Summary of this visit's results, reviewed by myself:  Labs:  Results for orders placed during the hospital encounter of 04/16/13 (from the past 24 hour(s))  CBC WITH DIFFERENTIAL     Status: Abnormal   Collection Time    04/16/13 11:27 PM      Result Value Range   WBC 7.8  4.0 - 10.5 K/uL   RBC 3.76 (*) 4.22 - 5.81 MIL/uL   Hemoglobin 12.5 (*) 13.0 - 17.0 g/dL   HCT 40.9 (*) 81.1 - 91.4 %   MCV 100.8 (*) 78.0 - 100.0 fL   MCH 33.2  26.0 - 34.0 pg   MCHC 33.0  30.0 - 36.0 g/dL   RDW 78.2  95.6 - 21.3 %   Platelets 305  150 - 400 K/uL   Neutrophils Relative 73  43 - 77 %   Neutro Abs 5.7  1.7 - 7.7 K/uL   Lymphocytes Relative 13  12 - 46 %   Lymphs Abs 1.0  0.7 - 4.0 K/uL   Monocytes Relative 13 (*) 3 - 12 %   Monocytes Absolute 1.0  0.1 - 1.0 K/uL   Eosinophils Relative 1  0 - 5 %   Eosinophils Absolute 0.0  0.0 - 0.7 K/uL   Basophils Relative 0  0 - 1 %   Basophils Absolute 0.0  0.0 - 0.1 K/uL  COMPREHENSIVE METABOLIC PANEL     Status: Abnormal   Collection Time    04/16/13 11:27 PM      Result Value Range   Sodium 138  135 - 145 mEq/L   Potassium 3.9  3.5 - 5.1 mEq/L   Chloride 101  96 - 112 mEq/L   CO2 29   19 - 32 mEq/L   Glucose, Bld 134 (*) 70 - 99 mg/dL   BUN 16  6 - 23 mg/dL   Creatinine, Ser 0.86 (*) 0.50 - 1.35 mg/dL   Calcium 8.8  8.4 - 57.8 mg/dL   Total Protein 6.1  6.0 - 8.3 g/dL   Albumin 3.1 (*) 3.5 - 5.2 g/dL   AST 26  0 - 37 U/L   ALT 16  0 - 53 U/L  Alkaline Phosphatase 63  39 - 117 U/L   Total Bilirubin 0.3  0.3 - 1.2 mg/dL   GFR calc non Af Amer 43 (*) >90 mL/min   GFR calc Af Amer 50 (*) >90 mL/min  TROPONIN I     Status: None   Collection Time    04/16/13 11:27 PM      Result Value Range   Troponin I <0.30  <0.30 ng/mL  PRO B NATRIURETIC PEPTIDE     Status: Abnormal   Collection Time    04/16/13 11:27 PM      Result Value Range   Pro B Natriuretic peptide (BNP) 1599.0 (*) 0 - 450 pg/mL  LACTIC ACID, PLASMA     Status: None   Collection Time    04/16/13 11:27 PM      Result Value Range   Lactic Acid, Venous 1.3  0.5 - 2.2 mmol/L  URINALYSIS, ROUTINE W REFLEX MICROSCOPIC     Status: Abnormal   Collection Time    04/16/13 11:50 PM      Result Value Range   Color, Urine YELLOW  YELLOW   APPearance CLEAR  CLEAR   Specific Gravity, Urine 1.016  1.005 - 1.030   pH 6.5  5.0 - 8.0   Glucose, UA NEGATIVE  NEGATIVE mg/dL   Hgb urine dipstick NEGATIVE  NEGATIVE   Bilirubin Urine NEGATIVE  NEGATIVE   Ketones, ur NEGATIVE  NEGATIVE mg/dL   Protein, ur NEGATIVE  NEGATIVE mg/dL   Urobilinogen, UA 1.0  0.0 - 1.0 mg/dL   Nitrite NEGATIVE  NEGATIVE   Leukocytes, UA TRACE (*) NEGATIVE  URINE MICROSCOPIC-ADD ON     Status: None   Collection Time    04/16/13 11:50 PM      Result Value Range   Squamous Epithelial / LPF RARE  RARE   WBC, UA 0-2  <3 WBC/hpf   RBC / HPF 0-2  <3 RBC/hpf   Bacteria, UA RARE  RARE    Imaging Studies: Dg Chest Port 1 View  04/17/2013  *RADIOLOGY REPORT*  Clinical Data: Weakness.  Chills.  Status post fall.  PORTABLE CHEST - 1 VIEW  Comparison: 12/25/2012  Findings: Midline trachea.  Patient rotated to the right.  Moderate cardiomegaly.  A  moderate right pleural effusion is increased since prior exam.  There is likely a small left pleural effusion. No pneumothorax.  No congestive failure.  Right base air space disease is increased.  Minimal left base subsegmental atelectasis.  IMPRESSION: Increased moderate right pleural effusion with adjacent atelectasis or infection. Recommend radiographic follow-up until clearing.  Minimal left base subsegmental atelectasis.  Possible small left pleural effusion.  Cardiomegaly without congestive failure.  No post-traumatic deformity identified.   Original Report Authenticated By: Jeronimo Greaves, M.D.    1:29 AM We will start antibiotics for likely pneumonia. We will have the patient admitted.  EKG Interpretation:  Date & Time: 04/16/2013 11:36 PM  Rate: 89  Rhythm: normal sinus rhythm  QRS Axis: normal  Intervals: PR prolonged  ST/T Wave abnormalities: normal  Conduction Disutrbances:first-degree A-V block   Narrative Interpretation:   Old EKG Reviewed: Previously A. fib with RVR          Hanley Seamen, MD 04/17/13 0129  Hanley Seamen, MD 04/17/13 0134  Hanley Seamen, MD 04/29/13 1610

## 2013-04-17 NOTE — Progress Notes (Signed)
ANTIBIOTIC CONSULT NOTE - INITIAL  Pharmacy Consult for vancomycin  Indication: PNA  Allergies  Allergen Reactions  . Bactrim (Sulfamethoxazole W-Trimethoprim) Other (See Comments)    hallucinations  . Horse-Derived Products Hives    Patient Measurements: Height: 5\' 8"  (172.7 cm) Weight: 198 lb 6.6 oz (90 kg) IBW/kg (Calculated) : 68.4 Adjusted Body Weight:   Vital Signs: Temp: 98.4 F (36.9 C) (04/24 0332) Temp src: Oral (04/24 0332) BP: 96/58 mmHg (04/24 0332) Pulse Rate: 75 (04/24 0332) Intake/Output from previous day:   Intake/Output from this shift:    Labs:  Recent Labs  04/16/13 2327  WBC 7.8  HGB 12.5*  PLT 305  CREATININE 1.40*   Estimated Creatinine Clearance: 39.7 ml/min (by C-G formula based on Cr of 1.4). No results found for this basename: VANCOTROUGH, VANCOPEAK, VANCORANDOM, GENTTROUGH, GENTPEAK, GENTRANDOM, TOBRATROUGH, TOBRAPEAK, TOBRARND, AMIKACINPEAK, AMIKACINTROU, AMIKACIN,  in the last 72 hours   Microbiology: No results found for this or any previous visit (from the past 720 hour(s)).  Medical History: Past Medical History  Diagnosis Date  . CAD (coronary artery disease)     s/p NSTEMI and BMS stent diagonal07/09;  Lexiscan Myoview 9/13:  EF 62%, no ischemia  . AS (aortic stenosis)     a.  Echo 2009 mean gradient 9mm HG. AVA 2.18;  b.  Echo 4/11 mild AS mean gradient 10mm HG;  c.  Echo 04/2011: Mild LVH, EF 55-60%, grade 1 diastolic dysfunction, mild aortic stenosis, mean gradient 14, mild LAE;  d. Echo 9/13: mod LVH, EF 50-55%, mild to mod AS, mean gradient 17 mmHg  . AF (paroxysmal atrial fibrillation)     Holter monitor 2.5 sec pauses  . Fatigue     CPX 11/09 VO2  14.4 (75% predicte)d, slope 35.  O2 pulse normal.  VO2 corrected for body weight 17.6.  Marland Kitchen Hypothyroidism   . Hyperlipidemia   . History of prostate cancer   . Allergic rhinitis   . DM (diabetes mellitus)     type II diet controlled  . Shingles   . Left carotid stenosis    . Vitamin B 12 deficiency   . Anemia   . Osteoarthritis   . Nephrolithiasis   . Obesity   . OSA (obstructive sleep apnea)     AHI 15 PSG 09/06/08  . Hx of colonoscopy   . Stroke   . Anxiety and depression   . PMR (polymyalgia rheumatica) 05/29/2012  . Hypogonadism male 05/29/2012  . Hyperparathyroidism 05/29/2012  . TIA (transient ischemic attack) 05/29/2012  . CKD (chronic kidney disease) stage 4, GFR 15-29 ml/min 05/29/2012  . COPD (chronic obstructive pulmonary disease) 05/29/2012  . Myocardial infarction   . Cancer     prostate ca history    Medications:  Prescriptions prior to admission  Medication Sig Dispense Refill  . albuterol (PROVENTIL HFA;VENTOLIN HFA) 108 (90 BASE) MCG/ACT inhaler Inhale 2 puffs into the lungs every 6 (six) hours as needed for wheezing or shortness of breath.  1 Inhaler  5  . Ascorbic Acid (VITAMIN C) 500 MG tablet Take 500 mg by mouth daily.        . cholecalciferol (VITAMIN D) 1000 UNITS tablet Take 1,000 Units by mouth daily.      . clopidogrel (PLAVIX) 75 MG tablet Take 1 tablet (75 mg total) by mouth daily.  90 tablet  3  . cyanocobalamin (,VITAMIN B-12,) 1000 MCG/ML injection 1 ml IM q 2 weeks  10 mL  5  . cyclobenzaprine (  FLEXERIL) 10 MG tablet Take 5-10 mg by mouth 3 (three) times daily as needed. Muscle spasms.      Marland Kitchen diltiazem (CARDIZEM CD) 180 MG 24 hr capsule Take 1 capsule (180 mg total) by mouth daily.      Marland Kitchen docusate sodium 100 MG CAPS Take 100 mg by mouth 2 (two) times daily.  10 capsule    . folic acid (FOLVITE) 1 MG tablet Take 1 mg by mouth daily.      . furosemide (LASIX) 20 MG tablet Take 1 tablet (20 mg total) by mouth daily.  30 tablet  3  . gabapentin (NEURONTIN) 300 MG capsule TAKE ONE CAPSULE BY MOUTH AT BEDTIME  30 capsule  5  . hyoscyamine (LEVSIN, ANASPAZ) 0.125 MG tablet Take 0.125 mg by mouth 4 (four) times daily as needed. Gas pain.      Marland Kitchen levothyroxine (SYNTHROID, LEVOTHROID) 137 MCG tablet Take 1 tablet (137 mcg total) by mouth  daily.  90 tablet  3  . methotrexate (RHEUMATREX) 2.5 MG tablet Take 10 mg by mouth once a week. Takes 4 tablets weekly on  Sunday.   Caution:Chemotherapy. Protect from light.      . pravastatin (PRAVACHOL) 40 MG tablet TAKE ONE TABLET BY MOUTH EVERY DAY  30 tablet  5  . sertraline (ZOLOFT) 50 MG tablet Take 1 tablet (50 mg total) by mouth daily.  90 tablet  3  . solifenacin (VESICARE) 5 MG tablet Take 1 tablet (5 mg total) by mouth 2 (two) times daily.  60 tablet  11  . traMADol (ULTRAM) 50 MG tablet Take 1 tablet (50 mg total) by mouth every 6 (six) hours as needed for pain.  120 tablet  2  . traZODone (DESYREL) 100 MG tablet Take 100 mg by mouth at bedtime.       Assessment: 79 yo male with generalized weakness sob and fever/chills after overdoing it 4 days ago by mowing lawn. Temp 102.7 non-compliant with O2 per son  Goal of Therapy:  Vancomycin trough level 15-20 mcg/ml  Plan:  Being vancomycin 1250mg  q24hours and check trough before 4th dose.   Janice Coffin 04/17/2013,4:41 AM

## 2013-04-17 NOTE — Progress Notes (Signed)
Patient arrived on the unit very sleep and lethargic, he was arousable but unable to obtain any history has been unable to stay awake. Will continue to monitor and look to obtain history once patient is awake and alert.

## 2013-04-17 NOTE — Progress Notes (Signed)
Utilization Review Completed.[name] T4/24/2014

## 2013-04-18 ENCOUNTER — Inpatient Hospital Stay (HOSPITAL_COMMUNITY): Payer: Medicare Other

## 2013-04-18 LAB — LEGIONELLA ANTIGEN, URINE: Legionella Antigen, Urine: NEGATIVE

## 2013-04-18 MED ORDER — TRAMADOL HCL 50 MG PO TABS
50.0000 mg | ORAL_TABLET | Freq: Four times a day (QID) | ORAL | Status: DC | PRN
  Administered 2013-04-18 – 2013-04-22 (×4): 50 mg via ORAL
  Filled 2013-04-18 (×4): qty 1

## 2013-04-18 NOTE — Progress Notes (Signed)
[facility]  PROGRESS NOTE Brief narrative:  [name] is an 79 y.o. male with a PMH of chronic respiratory failure, noncompliant with home oxygen, obstructive sleep apnea, right-sided pleural effusion, aortic stenosis, atrial fibrillation, and chronic kidney disease who presented to the [facility] ER with fever, cough, and hypoxia on 04/17/2013. He was febrile on admission with a temperature of greater than 102. Chest radiograph showed worsening right-sided pleural effusion.  He underwent thoracentesis on 4/24.   Assessment/Plan: Principal Problem:  PNA (pneumonia) with acute on chronic respiratory failure / COPD / pleural effusion  - On empiric vancomycin, Rocephin, and azithromycin, after 48 hours and if Cx NGTD, consider to d/c vancomycin - Thoracentesis performed and well tolerated, fluid is NGTD.  - BC X 2 4/23 NGTD, BC X 2 4/24 NGTD.  Sputum not sent - Has weaned down to  oxygen.  - Continue bronchodilator therapy as needed.  - Urine for legionella and s. pneumo negative.   Active Problems:   AF (paroxysmal atrial fibrillation)  -Rate controlled on Cardizem.   CKD (chronic kidney disease) stage 4, GFR 15-29 ml/min  -Creatinine stable, check BMET in the AM  General weakness  -PT/OT evaluation tomorrow if continues to do well  Hypothyroidism  -Continue Synthroid   Code Status: Full.  Family Communication: Updated patient, no family at bedsice Disposition Plan: Home when stable.   Medical Consultants:  None.  Other Consultants:  IR  Anti-infectives:  Vancomycin 04/17/13--->  Azithromycin 04/17/13--->  Rocephin 04/17/13--->    Subjective: [name] notes that he is improved today, continues to require O2, but his breathing is better.  Otherwise, no new complaints.  Has chronic indwelling foley  Objective: Vital signs in last 24 hours: Temp:  [98.7 F (37.1 C)-100.1 F (37.8 C)] 99 F (37.2 C) (04/25 0415) Pulse Rate:  [66-75] 71 (04/25 0415) Resp:  [18-20]  20 (04/25 0415) BP: (125-162)/(53-71) 135/59 mmHg (04/25 0415) SpO2:  [95 %-99 %] 99 % (04/25 0415) FiO2 (%):  [40 %] 40 % (04/24 1550) Weight change:  Last BM Date: 04/16/13  Intake/Output from previous day:   Intake/Output this shift: Total I/O In: 240 [P.O.:240] Out: -   General appearance: alert, cooperative and appears stated age Head: Normocephalic, without obvious abnormality, atraumatic Eyes: EOMI, anicteric sclerae Resp: decreased breath sounds in the right base, otherwise clear with occasional wheeze Cardio: RR, NR, no murmur GI: soft, non-tender; bowel sounds normal; no masses,  no organomegaly GU: Chronic foley in place draining clear yellow urine.   Lab Results:  Recent Labs  04/16/13 2327 04/17/13 0657  WBC 7.8 6.6  HGB 12.5* 12.1*  HCT 37.9* 36.1*  PLT 305 291   BMET  Recent Labs  04/16/13 2327 04/17/13 0657  NA 138 139  K 3.9 4.2  CL 101 103  CO2 29 26  GLUCOSE 134* 99  BUN 16 16  CREATININE 1.40* 1.45*  CALCIUM 8.8 8.6    Studies/Results: Dg Chest 1 View  04/17/2013  *RADIOLOGY REPORT*  Clinical Data: Status post right thoracentesis  CHEST - 1 VIEW  Comparison: Multiple prior images were reviewed including CT scan and chest x-rays dated 11/18/2012 and more recent chest x-ray dated 04/20 03/13/2013 and 12/25/2012  Findings: There is a small right apical and basilar pneumothorax with evidence of chronic atelectasis in the right lung base. Comparison to prior films reveal a similar appearance of the chest on prior radiographs and CT scan raising the possibility of a stuck lung and in pneumothorax  ex vacuo.  There has been a significant decrease in the volume of pleural fluid.  Slight shift of the cardiac and mediastinal structures in the left in the right indicating an element of volume loss.  No focal airspace consolidation or pulmonary edema.  IMPRESSION:  Small to moderate right apical and basilar pneumothorax, favor pneumothorax ex vacuo secondary  to stuck lung.  The patient has had prior chronic hydropneumothorax on imaging dating back to at least November 2013.   Original Report Authenticated By: Malachy Moan, M.D.    Dg Chest Port 1 View  04/18/2013  *RADIOLOGY REPORT*  Clinical Data: Evaluate right-sided pneumothorax  PORTABLE CHEST - 1 VIEW  Comparison: 04/17/2013; 04/16/2013; 12/25/2012; 11/19/2012  Findings: Grossly unchanged enlarged cardiac silhouette and mediastinal contours.  Interval increase in the amount of fluid with corresponding decrease in the now minimal amount of residual air within the previously identified presumably ex vacuo hydropneumothorax.  Persistent right-sided volume loss.  Lung volumes are slightly reduced with corresponding increasing bibasilar opacities, right greater than left.  No new focal airspace opacities.  Grossly unchanged bones including a possible loose body within the right glenohumeral joint.  IMPRESSION: Interval increase in the amount of fluid within an otherwise grossly unchanged ex vacuo right-sided hydropneumothorax as has previously been demonstrated on remote chest radiograph performed 11/19/2012.   Original Report Authenticated By: Tacey Ruiz, MD    Dg Chest Port 1 View  04/17/2013  *RADIOLOGY REPORT*  Clinical Data: Evaluate right pneumothorax  PORTABLE CHEST - 1 VIEW  Comparison: Chest x-ray obtained earlier today 04/17/2013 at 14 09:00 p.m.  Findings: Stable small right apical and basilar pneumothorax with chronic atelectasis versus scarring in the right lower lobe. Persistent left to right shift of the mediastinum and cardiac structures consistent with underlying volume loss.  Overall, no significant interval change in the appearance of the chest.  IMPRESSION:  Stable right apical and basilar pneumothorax.  Favor pneumothorax ex vacuo with chronic non expansion of the right middle and lower lobes.    Original Report Authenticated By: Malachy Moan, M.D.    Dg Chest Port 1  View  04/17/2013  *RADIOLOGY REPORT*  Clinical Data: Weakness.  Chills.  Status post fall.  PORTABLE CHEST - 1 VIEW  Comparison: 12/25/2012  Findings: Midline trachea.  Patient rotated to the right.  Moderate cardiomegaly.  A moderate right pleural effusion is increased since prior exam.  There is likely a small left pleural effusion. No pneumothorax.  No congestive failure.  Right base air space disease is increased.  Minimal left base subsegmental atelectasis.  IMPRESSION: Increased moderate right pleural effusion with adjacent atelectasis or infection. Recommend radiographic follow-up until clearing.  Minimal left base subsegmental atelectasis.  Possible small left pleural effusion.  Cardiomegaly without congestive failure.  No post-traumatic deformity identified.   Original Report Authenticated By: Jeronimo Greaves, M.D.    US Thoracentesis Asp Pleural Space W/img Guide  04/17/2013  *RADIOLOGY REPORT*  Clinical Data:  Right-sided pleural effusion, fever.  Request for diagnostic and therapeutic thoracentesis  ULTRASOUND GUIDED right THORACENTESIS  Comparison:  None  An ultrasound guided thoracentesis was thoroughly discussed with the patient and questions answered.  The benefits, risks, alternatives and complications were also discussed.  The patient understands and wishes to proceed with the procedure.  Written consent was obtained.  Ultrasound was performed to localize and mark an adequate pocket of fluid in the right chest.  The area was then prepped and draped in the normal sterile fashion.  1% Lidocaine was used for local anesthesia.  Under ultrasound guidance a 19 gauge Yueh catheter was introduced.  Thoracentesis was performed.  The catheter was removed and a dressing applied.  Complications:  None immediate  Findings: A total of approximately 1.1 liters of clear yellow fluid was removed. A fluid sample was sent for laboratory analysis.  IMPRESSION: Successful ultrasound guided right thoracentesis yielding  1.1 liters of pleural fluid.  Read by Brayton El PA-C   Original Report Authenticated By: Malachy Moan, M.D.     Medications: I have reviewed the patient's current medications.    LOS: 2 days   MULLEN, EMILY 04/18/2013, 12:05 PM

## 2013-04-19 DIAGNOSIS — J189 Pneumonia, unspecified organism: Principal | ICD-10-CM

## 2013-04-19 LAB — BASIC METABOLIC PANEL
BUN: 14 mg/dL (ref 6–23)
CO2: 25 mEq/L (ref 19–32)
Calcium: 8.2 mg/dL — ABNORMAL LOW (ref 8.4–10.5)
Creatinine, Ser: 1.17 mg/dL (ref 0.50–1.35)
GFR calc non Af Amer: 54 mL/min — ABNORMAL LOW (ref >90)
Glucose, Bld: 90 mg/dL (ref 70–99)

## 2013-04-19 NOTE — Progress Notes (Addendum)
[facility]  PROGRESS NOTE Brief narrative:  [name] is an 79 y.o. male with a PMH of chronic respiratory failure, noncompliant with home oxygen, obstructive sleep apnea, right-sided pleural effusion, aortic stenosis, atrial fibrillation, and chronic kidney disease who presented to the [facility] ER with fever, cough, and hypoxia on 04/17/2013. He was febrile on admission with a temperature of greater than 102. Chest radiograph showed worsening right-sided pleural effusion.  He underwent thoracentesis on 4/24.   Assessment/Plan: Principal Problem:  PNA (pneumonia) with acute on chronic respiratory failure / COPD / pleural effusion  - On empiric  Rocephin, and azithromycin, after 48 hours and if Cx NGTD - Cx NGTD so far, will d/c vancomycin - Thoracentesis performed and well tolerated, fluid is NGTD.  - BC X 2 4/23 NGTD, BC X 2 4/24 NGTD.  Sputum not sent - Comfortable with oxygen today - Continue bronchodilator therapy as needed.  - Urine for legionella and s. pneumo negative.  - Consider PO Abx tomorrow.   Active Problems:   AF (paroxysmal atrial fibrillation)  - Rate controlled on Cardizem.   CKD (chronic kidney disease) stage 4, GFR 15-29 ml/min  - Creatinine stable - Cr improved to 1.17 this AM, 4/26  General weakness  - PT/OT evaluation today - would like to go to rehab for strengthening if considered necessary.   Hypothyroidism  - Continue Synthroid   HTN - BP elevated to 161 systolic today, may need addition of another agent.  Monitor tomorrow and add ACE/ARB if indicated (on CCB and lasix)  Code Status: Full.  Family Communication: Updated patient, attempted to call son, voicemail box full.  Disposition Plan: Possible subacute rehab for strengthening   Medical Consultants:  None.  Other Consultants:  IR  Anti-infectives:  Vancomycin 04/17/13---> 4/26 Azithromycin 04/17/13--->  Rocephin 04/17/13--->    Subjective: [name] doing well today, breathing  better.  Would like to get up with PT.  He is a [employer].  Chronic indwelling foley in place.  He is also requesting a new hospital bed at home.   Objective: Vital signs in last 24 hours: Temp:  [97.5 F (36.4 C)-98.8 F (37.1 C)] 97.5 F (36.4 C) (04/26 0433) Pulse Rate:  [60-79] 79 (04/26 1105) Resp:  [18-20] 18 (04/26 0433) BP: (132-146)/(66-68) 146/68 mmHg (04/26 0433) SpO2:  [95 %-98 %] 98 % (04/26 1105) Weight change:  Last BM Date: 04/18/13  Intake/Output from previous day: 04/25 0701 - 04/26 0700 In: 720 [P.O.:720] Out: 2900 [Urine:2900] Intake/Output this shift: Total I/O In: -  Out: 1 [Stool:1]  General appearance: alert, cooperative and appears stated age Head: Normocephalic Eyes: EOMI, anicteric sclerae Resp: breath sounds improved in the right base, some crackles.  Minimal wheezing throughout Cardio: RR, NR, no murmur GI: soft, non-tender; +BS GU: Chronic foley in place draining clear yellow urine.   Lab Results:  Recent Labs  04/16/13 2327 04/17/13 0657  WBC 7.8 6.6  HGB 12.5* 12.1*  HCT 37.9* 36.1*  PLT 305 291   BMET  Recent Labs  04/17/13 0657 04/19/13 0601  NA 139 136  K 4.2 4.0  CL 103 98  CO2 26 25  GLUCOSE 99 90  BUN 16 14  CREATININE 1.45* 1.17  CALCIUM 8.6 8.2*    Studies/Results: Dg Chest 1 View  04/17/2013  *RADIOLOGY REPORT*  Clinical Data: Status post right thoracentesis  CHEST - 1 VIEW  Comparison: Multiple prior images were reviewed including CT scan and chest x-rays dated 11/18/2012 and  more recent chest x-ray dated 04/20 03/13/2013 and 12/25/2012  Findings: There is a small right apical and basilar pneumothorax with evidence of chronic atelectasis in the right lung base. Comparison to prior films reveal a similar appearance of the chest on prior radiographs and CT scan raising the possibility of a stuck lung and in pneumothorax ex vacuo.  There has been a significant decrease in the volume of pleural fluid.  Slight shift  of the cardiac and mediastinal structures in the left in the right indicating an element of volume loss.  No focal airspace consolidation or pulmonary edema.  IMPRESSION:  Small to moderate right apical and basilar pneumothorax, favor pneumothorax ex vacuo secondary to stuck lung.  The patient has had prior chronic hydropneumothorax on imaging dating back to at least November 2013.   Original Report Authenticated By: Malachy Moan, M.D.    Dg Chest Port 1 View  04/18/2013  *RADIOLOGY REPORT*  Clinical Data: Evaluate right-sided pneumothorax  PORTABLE CHEST - 1 VIEW  Comparison: 04/17/2013; 04/16/2013; 12/25/2012; 11/19/2012  Findings: Grossly unchanged enlarged cardiac silhouette and mediastinal contours.  Interval increase in the amount of fluid with corresponding decrease in the now minimal amount of residual air within the previously identified presumably ex vacuo hydropneumothorax.  Persistent right-sided volume loss.  Lung volumes are slightly reduced with corresponding increasing bibasilar opacities, right greater than left.  No new focal airspace opacities.  Grossly unchanged bones including a possible loose body within the right glenohumeral joint.  IMPRESSION: Interval increase in the amount of fluid within an otherwise grossly unchanged ex vacuo right-sided hydropneumothorax as has previously been demonstrated on remote chest radiograph performed 11/19/2012.   Original Report Authenticated By: Tacey Ruiz, MD    Dg Chest Port 1 View  04/17/2013  *RADIOLOGY REPORT*  Clinical Data: Evaluate right pneumothorax  PORTABLE CHEST - 1 VIEW  Comparison: Chest x-ray obtained earlier today 04/17/2013 at 14 09:00 p.m.  Findings: Stable small right apical and basilar pneumothorax with chronic atelectasis versus scarring in the right lower lobe. Persistent left to right shift of the mediastinum and cardiac structures consistent with underlying volume loss.  Overall, no significant interval change in the  appearance of the chest.  IMPRESSION:  Stable right apical and basilar pneumothorax.  Favor pneumothorax ex vacuo with chronic non expansion of the right middle and lower lobes.    Original Report Authenticated By: Malachy Moan, M.D.    US Thoracentesis Asp Pleural Space W/img Guide  04/17/2013  *RADIOLOGY REPORT*  Clinical Data:  Right-sided pleural effusion, fever.  Request for diagnostic and therapeutic thoracentesis  ULTRASOUND GUIDED right THORACENTESIS  Comparison:  None  An ultrasound guided thoracentesis was thoroughly discussed with the patient and questions answered.  The benefits, risks, alternatives and complications were also discussed.  The patient understands and wishes to proceed with the procedure.  Written consent was obtained.  Ultrasound was performed to localize and mark an adequate pocket of fluid in the right chest.  The area was then prepped and draped in the normal sterile fashion.  1% Lidocaine was used for local anesthesia.  Under ultrasound guidance a 19 gauge Yueh catheter was introduced.  Thoracentesis was performed.  The catheter was removed and a dressing applied.  Complications:  None immediate  Findings: A total of approximately 1.1 liters of clear yellow fluid was removed. A fluid sample was sent for laboratory analysis.  IMPRESSION: Successful ultrasound guided right thoracentesis yielding 1.1 liters of pleural fluid.  Read by Brayton El  PA-C   Original Report Authenticated By: Malachy Moan, M.D.     Medications: I have reviewed the patient's current medications.    LOS: 3 days   Demosthenes Virnig 04/19/2013, 1:46 PM

## 2013-04-20 ENCOUNTER — Inpatient Hospital Stay (HOSPITAL_COMMUNITY): Payer: Medicare Other

## 2013-04-20 DIAGNOSIS — N179 Acute kidney failure, unspecified: Secondary | ICD-10-CM

## 2013-04-20 DIAGNOSIS — I10 Essential (primary) hypertension: Secondary | ICD-10-CM

## 2013-04-20 LAB — BODY FLUID CULTURE: Gram Stain: NONE SEEN

## 2013-04-20 MED ORDER — HYDRALAZINE HCL 25 MG PO TABS
25.0000 mg | ORAL_TABLET | Freq: Three times a day (TID) | ORAL | Status: DC
  Administered 2013-04-20 – 2013-04-21 (×2): 25 mg via ORAL
  Filled 2013-04-20 (×5): qty 1

## 2013-04-20 MED ORDER — AZITHROMYCIN 500 MG PO TABS
500.0000 mg | ORAL_TABLET | Freq: Every day | ORAL | Status: DC
  Administered 2013-04-20: 500 mg via ORAL
  Filled 2013-04-20 (×2): qty 1

## 2013-04-20 NOTE — Progress Notes (Signed)
Physical Therapy Note (Preliminary)  SATURATION QUALIFICATIONS: (This note is used to comply with regulatory documentation for home oxygen)  Patient Saturations on Room Air at Rest = 94%  Patient Saturations on Room Air while Ambulating = 83% (observed lowest)  Patient Saturations on 2Liters of oxygen while Ambulating = 93%  Please briefly explain why patient needs home oxygen: Pt required supplemental O2 to maintain appropriate oxygen saturation levels during activity  Full PT eval note to follow, Thanks,  Van Clines, [place] [phone]

## 2013-04-20 NOTE — Progress Notes (Signed)
[facility] PROGRESS NOTE  [name] EXB:[id number] DOB: 1923-12-06 DOA: 04/16/2013 PCP: [name], MD  Assessment/Plan: Principal Problem:   PNA (pneumonia) Active Problems:   HYPOTHYROIDISM   AORTIC STENOSIS/ INSUFFICIENCY, NON-RHEUMATIC   AF (paroxysmal atrial fibrillation)   CKD (chronic kidney disease) stage 4, GFR 15-29 ml/min   COPD (chronic obstructive pulmonary disease)   General weakness   Pleural effusion     1. PNA (pneumonia) with acute on chronic respiratory failure/ COPD/Pleural effusion:  Patient presented with fever, cough, and hypoxia, with a temperature of greater than 102. Chest radiograph showed worsening right-sided pleural effusion. This was felt to parapneumonic, due to CAP. He was managed with iv Rocephin/vancomycin/Azithromycin. Urine for legionella and s. pneumo negative. Patient underwent right-sided thoracocentesis on 04/17/13, with evacuation of 1.1 liter fluid. Cultures have remained negative so far, patient has improved clinicaly, defervesced and wcc is normal. Vancomycin  Has been discontinued today. CXR of 04/18/13 revealed Interval increase in the amount of fluid within an otherwise grossly unchanged ex vacuo right-sided hydropneumothorax. Will seek pulmonology opinion on 04/21/13. Repeat CXR in AM.   2. PAF (paroxysmal atrial fibrillation): Rate controlled on Cardizem CD.  3. CKD (chronic kidney disease) stage 4, GFR 15-29 ml/min: At presentation, creatinine was 1.40. This appears to be patent's baseline. Followed renal indices, and as of 04/19/13, creatinine was 1.17. 4. General weakness: Due to deconditioning and recent acute illness. Evaluated by PT/OTand CIR recommended. Rehab MD consult is pending. 5. Hypothyroidism: Continued on Synthroid.  6. HTN: BP remains sub-optimally controlled, on Cardizem CD. Will add Hydralazine to treatment.    Code Status: Full Code.  Family Communication:  Disposition Plan: To be determined.    Brief  narrative: 79 y.o. male with a PMH of chronic respiratory failure, noncompliant with home oxygen, obstructive sleep apnea, right-sided pleural effusion, aortic stenosis, atrial fibrillation, and chronic kidney disease who presented to the [facility] ER with fever, cough, and hypoxia on 04/17/2013. He was febrile on admission with a temperature of greater than 102. Chest radiograph showed worsening right-sided pleural effusion. He underwent thoracentesis on 04/17/13.    Consultants:  N/A.   Procedures:  CXRs.  Right thoracocentesis 04/17/13.   Antibiotics:  Rocephin/Azithromycin/Vancomycin 04/16/13>>>  HPI/Subjective: No new issues.   Objective: Vital signs in last 24 hours: Temp:  [97.5 F (36.4 C)-99.2 F (37.3 C)] 97.5 F (36.4 C) (04/27 1320) Pulse Rate:  [73-80] 80 (04/27 1320) Resp:  [18-19] 18 (04/27 1320) BP: (147-178)/(77-83) 163/77 mmHg (04/27 1320) SpO2:  [97 %-100 %] 97 % (04/27 1320) Weight change:  Last BM Date: 04/20/13  Intake/Output from previous day: 04/26 0701 - 04/27 0700 In: 480 [P.O.:480] Out: 902 [Urine:900; Stool:2]     Physical Exam: General: Comfortable, alert, communicative, fully oriented, not short of breath at rest.  HEENT:  No clinical pallor, no jaundice, no conjunctival injection or discharge. NECK:  Supple, JVP not seen, no carotid bruits, no palpable lymphadenopathy, no palpable goiter. CHEST:  Clinically clear to auscultation, no wheezes, no crackles. HEART:  Sounds 1 and 2 heard, normal, regular, no murmurs. ABDOMEN:  Full, soft, non-tender, no palpable organomegaly, no palpable masses, normal bowel sounds. GENITALIA:  Not examined. LOWER EXTREMITIES:  No pitting edema, palpable peripheral pulses. MUSCULOSKELETAL SYSTEM:  Generalized osteoarthritic changes, otherwise, normal. CENTRAL NERVOUS SYSTEM:  No focal neurologic deficit on gross examination.  Lab Results: No results found for this basename: WBC, HGB, HCT, PLT,  in the last 72  hours  Recent Labs  04/19/13  0601  NA 136  K 4.0  CL 98  CO2 25  GLUCOSE 90  BUN 14  CREATININE 1.17  CALCIUM 8.2*   Recent Results (from the past 240 hour(s))  CULTURE, BLOOD (ROUTINE X 2)     Status: None   Collection Time    04/16/13 11:25 PM      Result Value Range Status   Specimen Description BLOOD LEFT FOREARM   Final   Special Requests BOTTLES DRAWN AEROBIC AND ANAEROBIC EACH   Final   Culture  Setup Time 04/17/2013 06:58   Final   Culture     Final   Value:        BLOOD CULTURE RECEIVED NO GROWTH TO DATE CULTURE WILL BE HELD FOR 5 DAYS BEFORE ISSUING A FINAL NEGATIVE REPORT   Report Status PENDING   Incomplete  CULTURE, BLOOD (ROUTINE X 2)     Status: None   Collection Time    04/16/13 11:39 PM      Result Value Range Status   Specimen Description BLOOD LEFT ARM   Final   Special Requests BOTTLES DRAWN AEROBIC AND ANAEROBIC EACH   Final   Culture  Setup Time 04/17/2013 06:58   Final   Culture     Final   Value:        BLOOD CULTURE RECEIVED NO GROWTH TO DATE CULTURE WILL BE HELD FOR 5 DAYS BEFORE ISSUING A FINAL NEGATIVE REPORT   Report Status PENDING   Incomplete  CULTURE, BLOOD (ROUTINE X 2)     Status: None   Collection Time    04/17/13  6:45 AM      Result Value Range Status   Specimen Description BLOOD RIGHT ARM   Final   Special Requests BOTTLES DRAWN AEROBIC ONLY 2CC   Final   Culture  Setup Time 04/17/2013 10:17   Final   Culture     Final   Value:        BLOOD CULTURE RECEIVED NO GROWTH TO DATE CULTURE WILL BE HELD FOR 5 DAYS BEFORE ISSUING A FINAL NEGATIVE REPORT   Report Status PENDING   Incomplete  CULTURE, BLOOD (ROUTINE X 2)     Status: None   Collection Time    04/17/13  6:57 AM      Result Value Range Status   Specimen Description BLOOD RIGHT HAND   Final   Special Requests BOTTLES DRAWN AEROBIC ONLY 5CC   Final   Culture  Setup Time 04/17/2013 10:17   Final   Culture     Final   Value:        BLOOD CULTURE RECEIVED NO GROWTH TO  DATE CULTURE WILL BE HELD FOR 5 DAYS BEFORE ISSUING A FINAL NEGATIVE REPORT   Report Status PENDING   Incomplete  BODY FLUID CULTURE     Status: None   Collection Time    04/17/13  2:05 PM      Result Value Range Status   Specimen Description PLEURAL FLUID RIGHT   Final   Special Requests FLUID   Final   Gram Stain     Final   Value: NO WBC SEEN     NO ORGANISMS SEEN   Culture NO GROWTH 3 DAYS   Final   Report Status 04/20/2013 FINAL   Final     Studies/Results: No results found.  Medications: Scheduled Meds: . azithromycin  500 mg Oral QHS  . cefTRIAXone (ROCEPHIN)  IV  1 g Intravenous  Q24H  . clopidogrel  75 mg Oral Daily  . diltiazem  180 mg Oral Daily  . enoxaparin (LOVENOX) injection  40 mg Subcutaneous Q24H  . furosemide  20 mg Oral Daily  . levothyroxine  137 mcg Oral Q breakfast  . sodium chloride  3 mL Intravenous Q12H   Continuous Infusions:  PRN Meds:.sodium chloride, acetaminophen, albuterol, sodium chloride, traMADol    LOS: 4 days   Yasmine Kilbourne,CHRISTOPHER  [facility] Pager [phone]. If 8PM-8AM, please contact night-coverage at [email or web address], password [facility] 04/20/2013, 7:02 PM  LOS: 4 days

## 2013-04-20 NOTE — Progress Notes (Signed)
Physical Therapy Evaluation Patient Details Name: [name] MRN: [id number] DOB: 1923-03-16 Today's Date: 04/20/2013 Time: 1200-1239 PT Time Calculation (min): 39 min  PT Assessment / Plan / Recommendation Clinical Impression  79 yo male admitted with hypoxia, pneumonia, pleural effusion presents to PT with decr functional mobility, decr activity tolerance; Will benefit from PT to maximize independence and safety with mobility/amb/and incr actrivity tolerance    PT Assessment  Patient needs continued PT services    Follow Up Recommendations  CIR    Does the patient have the potential to tolerate intense rehabilitation      Barriers to Discharge Decreased caregiver support housekeeper cannot provide much physical assist; pt must be mod i    Equipment Recommendations  None recommended by PT    Recommendations for Other Services OT consult;Rehab consult   Frequency Min 3X/week    Precautions / Restrictions Precautions Precautions: Fall Precaution Comments: Lightheaded with activity; Orthostatic Restrictions Weight Bearing Restrictions: No   Pertinent Vitals/Pain Sitting/Standing BPs as follows:  EOB 125/68 HR 89 Standing 105/93 (did not get HR) Seated in recliner 125/71 HR 90      Mobility  Bed Mobility Bed Mobility: Supine to Sit;Sitting - Scoot to Edge of Bed Supine to Sit: 4: Min assist;With rails Sitting - Scoot to [place] of Bed: 4: Min assist;With rail Details for Bed Mobility Assistance: Cues for initiation and technique; very slow moving, uncoordinated movement with getting up Transfers Transfers: Sit to Stand;Stand to Sit Sit to Stand: 3: Mod assist;From bed Stand to Sit: 3: Mod assist;With armrests;With upper extremity assist;To chair/3-in-1 Details for Transfer Assistance: Light mod assist antigravity for sit to stand; Cues for technique, safety, hand placement; physical assist to control descent with stand to sit Ambulation/Gait Ambulation/Gait  Assistance: 4: Min assist Ambulation Distance (Feet): 12 Feet Assistive device: Rolling walker Ambulation/Gait Assistance Details: Limited amb as pt became lightheaded with upright activity;  flexed posture; very slow cadence Gait Pattern: Decreased stride length    Exercises     PT Diagnosis: Difficulty walking;Generalized weakness  PT Problem List: Decreased strength;Decreased activity tolerance;Decreased balance;Decreased mobility;Decreased coordination;Decreased knowledge of use of DME;Decreased safety awareness;Decreased knowledge of precautions;Pain PT Treatment Interventions: DME instruction;Gait training;Stair training;Functional mobility training;Therapeutic activities;Therapeutic exercise;Balance training;Patient/family education   PT Goals Acute Rehab PT Goals PT Goal Formulation: With patient/family Time For Goal Achievement: 05/04/13 Potential to Achieve Goals: Good Pt will go Supine/Side to Sit: with supervision PT Goal: Supine/Side to Sit - Progress: Goal set today Pt will go Sit to Supine/Side: with supervision PT Goal: Sit to Supine/Side - Progress: Goal set today Pt will go Sit to Stand: with supervision PT Goal: Sit to Stand - Progress: Goal set today Pt will go Stand to Sit: with supervision PT Goal: Stand to Sit - Progress: Goal set today Pt will Transfer Bed to Chair/Chair to Bed: with supervision PT Transfer Goal: Bed to Chair/Chair to Bed - Progress: Goal set today Pt will Ambulate: 51 - 150 feet;with supervision;with rolling walker PT Goal: Ambulate - Progress: Goal set today Pt will Go Up / Down Stairs: 3-5 stairs;with min assist;with rail(s) PT Goal: Up/Down Stairs - Progress: Goal set today  Visit Information  Last PT Received On: 04/20/13 Assistance Needed: +2 (helpful to push chair)    Subjective Data  Subjective: Agreeable to amb Patient Stated Goal: wants to feel better   Prior Functioning  Home Living Lives With: Spouse;Other (Comment)  (Live-in housekeeper (caregiver for wife with dementia)) Available Help at Discharge: Friend(s);Available PRN/intermittently;Available  24 hours/day (housekeeper cannot give much physical assist) Type of Home: House Home Access: Stairs to enter [employer] of Steps: 3 (front door) Entrance Stairs-Rails:  (to be determined) Home Layout: One level Bathroom Shower/Tub: Tub/shower unit Home Adaptive Equipment: Walker - rolling Prior Function Level of Independence: Independent with assistive device(s);Needs assistance Needs Assistance: Light Housekeeping;Meal Prep Able to Take Stairs?: Yes Communication Communication: HOH    Cognition  Cognition Arousal/Alertness: Awake/alert Behavior During Therapy: WFL for tasks assessed/performed Overall Cognitive Status: Within Functional Limits for tasks assessed    Extremity/Trunk Assessment Right Upper Extremity Assessment RUE ROM/Strength/Tone: WFL for tasks assessed Left Upper Extremity Assessment LUE ROM/Strength/Tone: WFL for tasks assessed Right Lower Extremity Assessment RLE ROM/Strength/Tone: Deficits RLE ROM/Strength/Tone Deficits: Grossly decr strength, requiring physical assist for sit to stand Left Lower Extremity Assessment LLE ROM/Strength/Tone: Deficits LLE ROM/Strength/Tone Deficits: Grossly decr strength, requiring physical assist for sit to stand   Balance    End of Session PT - End of Session Equipment Utilized During Treatment: Gait belt;Oxygen Activity Tolerance: Other (comment) (limited by lightheadedness) Patient left: in chair;with call bell/phone within reach;with family/visitor present Nurse Communication: Mobility status;Other (comment) (need for supplemental O2)  GP     Olen Pel [place], [place] [phone]  04/20/2013, 3:36 PM

## 2013-04-21 DIAGNOSIS — F329 Major depressive disorder, single episode, unspecified: Secondary | ICD-10-CM

## 2013-04-21 DIAGNOSIS — R5381 Other malaise: Secondary | ICD-10-CM

## 2013-04-21 LAB — BASIC METABOLIC PANEL
CO2: 33 mEq/L — ABNORMAL HIGH (ref 19–32)
Calcium: 8.4 mg/dL (ref 8.4–10.5)
Creatinine, Ser: 1.12 mg/dL (ref 0.50–1.35)
GFR calc non Af Amer: 57 mL/min — ABNORMAL LOW (ref >90)
Glucose, Bld: 100 mg/dL — ABNORMAL HIGH (ref 70–99)
Sodium: 137 mEq/L (ref 135–145)

## 2013-04-21 LAB — CBC
MCH: 32.4 pg (ref 26.0–34.0)
MCHC: 34.4 g/dL (ref 30.0–36.0)
MCV: 94.3 fL (ref 78.0–100.0)
Platelets: 247 10*3/uL (ref 150–400)

## 2013-04-21 MED ORDER — LEVOFLOXACIN 750 MG PO TABS
750.0000 mg | ORAL_TABLET | Freq: Every day | ORAL | Status: DC
  Administered 2013-04-21 – 2013-04-22 (×2): 750 mg via ORAL
  Filled 2013-04-21 (×2): qty 1

## 2013-04-21 MED ORDER — HYDRALAZINE HCL 50 MG PO TABS
50.0000 mg | ORAL_TABLET | Freq: Three times a day (TID) | ORAL | Status: DC
  Administered 2013-04-22 (×3): 50 mg via ORAL
  Filled 2013-04-21 (×6): qty 1

## 2013-04-21 NOTE — Progress Notes (Signed)
Occupational Therapy Evaluation Patient Details Name: [name] MRN: [id number] DOB: [birth year] Today's Date: 04/21/2013 Time: 4098-1191 OT Time Calculation (min): 24 min  OT Assessment / Plan / Recommendation Clinical Impression  79 yo male admitted with hypoxia, pneumonia, pleural effusion presents to OT with decr functional mobility, decr activity tolerance; decr ADL performance. Will benefit from OT to maximize independence and safety with ADLs, mobility, and incr actrivity tolerance    OT Assessment  Patient needs continued OT Services    Follow Up Recommendations  CIR    Barriers to Discharge      Equipment Recommendations  Other (comment) (TBD)    Recommendations for Other Services Rehab consult  Frequency  Min 2X/week    Precautions / Restrictions Precautions Precautions: Fall Precaution Comments: Lightheaded with activity; Orthostatic Restrictions Weight Bearing Restrictions: No   Pertinent Vitals/Pain     ADL  Eating/Feeding: Performed;Set up Where Assessed - Eating/Feeding: Edge of bed Grooming: Performed;Wash/dry hands;Wash/dry face;Teeth care;Min guard Where Assessed - Grooming: Supported standing Upper Body Bathing: Simulated;Minimal assistance Where Assessed - Upper Body Bathing: Unsupported sitting Lower Body Bathing: Simulated;Moderate assistance Where Assessed - Lower Body Bathing: Supported sit to stand Upper Body Dressing: Simulated;Minimal assistance Where Assessed - Upper Body Dressing: Unsupported sitting Lower Body Dressing: Performed;Moderate assistance Where Assessed - Lower Body Dressing: Supported sit to stand Toilet Transfer: Minimal assistance;Simulated Statistician Method: Sit to Barista: Bedside commode Transfers/Ambulation Related to ADLs: Patient performed bed mobility with S and increased time with use of rails. Sit to stand min guard A, ambulated to sink with RW min A.  ADL Comments: Patient fatigues  very quickly with ADLs. Decreased activity tolerance with minimal activity.    OT Diagnosis: Generalized weakness  OT Problem List: Decreased activity tolerance;Decreased knowledge of use of DME or AE;Cardiopulmonary status limiting activity OT Treatment Interventions: Self-care/ADL training;Therapeutic exercise;Energy conservation;DME and/or AE instruction;Therapeutic activities;Patient/family education   OT Goals Acute Rehab OT Goals OT Goal Formulation: With patient Time For Goal Achievement: 05/05/13 Potential to Achieve Goals: Good ADL Goals Pt Will Perform Grooming: with modified independence;Standing at sink ADL Goal: Grooming - Progress: Goal set today Pt Will Perform Upper Body Bathing: with modified independence;Standing at sink ADL Goal: Upper Body Bathing - Progress: Goal set today Pt Will Perform Lower Body Bathing: with modified independence;Standing at sink ADL Goal: Lower Body Bathing - Progress: Goal set today Pt Will Perform Upper Body Dressing: with modified independence;Sitting, bed ADL Goal: Upper Body Dressing - Progress: Goal set today Pt Will Perform Lower Body Dressing: with modified independence;Sit to stand from bed ADL Goal: Lower Body Dressing - Progress: Goal set today Pt Will Transfer to Toilet: with modified independence;Regular height toilet;Ambulation ADL Goal: Toilet Transfer - Progress: Goal set today Pt Will Perform Toileting - Clothing Manipulation: with modified independence;Standing ADL Goal: Toileting - Clothing Manipulation - Progress: Goal set today Pt Will Perform Toileting - Hygiene: with modified independence;Standing at 3-in-1/toilet ADL Goal: Toileting - Hygiene - Progress: Goal set today Pt Will Perform Tub/Shower Transfer: with min assist;with DME ADL Goal: Tub/Shower Transfer - Progress: Goal set today Additional ADL Goal #1: Patient will be able to state and utilize 3 energy conservation techniques related to ADLs. ADL Goal:  Additional Goal #1 - Progress: Goal set today  Visit Information  Last OT Received On: 04/21/13 Assistance Needed: +1    Subjective Data  Patient Stated Goal: To go home   Prior Functioning     Home Living Lives With: Spouse;Other (  Comment) (caregiver for wife with dementia) Available Help at Discharge: Friend(s);Available PRN/intermittently;Available 24 hours/day Type of Home: House Home Access: Stairs to enter [employer] of Steps: 3 Home Layout: One level Bathroom Shower/Tub: Engineer, manufacturing systems: Standard Home Adaptive Equipment: Walker - rolling Prior Function Level of Independence: Independent with assistive device(s);Needs assistance Needs Assistance: Light Housekeeping;Meal Prep Able to Take Stairs?: Yes Driving: Yes Vocation: Retired Musician: HOH         Vision/Perception Vision - History Baseline Vision: Wears glasses only for reading Patient Visual Report: No change from baseline   Cognition  Cognition Arousal/Alertness: Awake/alert Behavior During Therapy: WFL for tasks assessed/performed Overall Cognitive Status: Within Functional Limits for tasks assessed    Extremity/Trunk Assessment Right Upper Extremity Assessment RUE ROM/Strength/Tone: [facility] for tasks assessed Left Upper Extremity Assessment LUE ROM/Strength/Tone: WFL for tasks assessed     End of Session OT - End of Session Equipment Utilized During Treatment: Gait belt Activity Tolerance: Patient limited by fatigue Patient left: in bed;with bed alarm set;with call bell/phone within reach Nurse Communication: Mobility status  GO     Dailee Manalang A 04/21/2013, 12:11 PM

## 2013-04-21 NOTE — Progress Notes (Signed)
IV site out of date. Okay to leave without IV site per Dr. Brien Few. Pt not receiving anything through IV. Potential d/c to CIR in AM. Chestine Spore, Bary Richard

## 2013-04-21 NOTE — Progress Notes (Signed)
I met with patient and then spoke with his son, Willaim Sheng, by phone. Patient prefers rehab at [facility] for it is close to his home and his wife of 68 years. Second preference is inpt rehab here. I have updated RN CM. I will verify bed availability and follow up in the morning. [phone]

## 2013-04-21 NOTE — Discharge Summary (Signed)
Physician Discharge Summary  [name] ZOX:[id number] DOB: 1923-05-29 DOA: 04/16/2013  PCP: [name], MD  Admit date: 04/16/2013 Discharge date: 04/22/2013  Time spent: 40 minutes  Recommendations for Outpatient Follow-up:  1. Follow up with primary MD.   Discharge Diagnoses:  Principal Problem:   PNA (pneumonia) Active Problems:   HYPOTHYROIDISM   AORTIC STENOSIS/ INSUFFICIENCY, NON-RHEUMATIC   AF (paroxysmal atrial fibrillation)   CKD (chronic kidney disease) stage 4, GFR 15-29 ml/min   COPD (chronic obstructive pulmonary disease)   General weakness   Pleural effusion   Discharge Condition: Satisfactory.   Diet recommendation: Heart-Healthy.   Filed Weights   04/16/13 2308 04/17/13 0332  Weight: 90.719 kg (200 lb) 90 kg (198 lb 6.6 oz)    History of present illness:  79 y.o. male with a PMH of chronic respiratory failure, noncompliant with home oxygen, obstructive sleep apnea, right-sided pleural effusion, aortic stenosis, atrial fibrillation, and chronic kidney disease who presented to the [facility] ER with fever, cough, and hypoxia on 04/17/2013. He was febrile on admission with a temperature of greater than 102. Chest radiograph showed worsening right-sided pleural effusion. He underwent thoracentesis on 04/17/13.    Hospital Course:  1. PNA (pneumonia) with acute on chronic respiratory failure/ COPD/Pleural effusion: Patient presented with fever, cough, and hypoxia, with a temperature of greater than 102. Chest radiograph showed worsening right-sided pleural effusion. This was felt to parapneumonic, due to CAP. He was managed with iv Rocephin/Vancomycin/Azithromycin. Urine for legionella and s. pneumo negative. Patient underwent right-sided thoracocentesis on 04/17/13, with evacuation of 1.1 liter fluid. Cultures have remained negative so far. Patient improved clinically, defervesced and wcc normalizedl. Vancomycin was discontinued on 04/20/13, and patient was transitioned to  oral Levaquin effective 04/21/13, to conclude a 10-day course of antibiotic treatment on 04/26/13.  Repeat CXR of 04/20/13, revealed interval improvement of right pleural effusion, which is now small. .  2. PAF (paroxysmal atrial fibrillation): Rate controlled on Cardizem CD.  3. CKD (chronic kidney disease) stage 4, GFR 15-29 ml/min: At presentation, creatinine was 1.40. This appears to be patent's baseline. Followed renal indices, and as of 04/19/13, creatinine was 1.17.  4. General weakness: Due to deconditioning and recent acute illness. Evaluated by PT/OTand CIR recommended. Dr Loletta Parish provided rehab consultation, and patient is considered a suitable candidate for CIR. .  5. Hypothyroidism: Continued on Synthroid.  6. HTN: BP was sub-optimally controlled during this hospitalization, on Cardizem CD, so Hydralazine was added on 04/20/13, with improved control.  7. Query depression: On 04/21/13, patient complained of feeling depressed. Per patient, he was on antidepressants many years ago, but none, recently. Have requested psychiatric consultaion, and Dr Carmelina Dane will see patient on 04/22/13.    Procedures:  See Below.   U/S-Guided right thoracocentesis on 04/17/13.   Consultations:  Dr Claudette Laws, Rehab MD.   Discharge Exam: Filed Vitals:   04/21/13 1418 04/21/13 1946 04/22/13 0657 04/22/13 1051  BP: 152/61 137/71 115/58 124/58  Pulse: 66 68    Temp: 98.2 F (36.8 C) 98.3 F (36.8 C) 97.9 F (36.6 C)   TempSrc: Oral Oral Oral   Resp: 18 19    Height:      Weight:      SpO2: 98% 99%      General: Comfortable, alert, communicative, fully oriented, not short of breath at rest.  HEENT: No clinical pallor, no jaundice, no conjunctival injection or discharge.  NECK: Supple, JVP not seen, no carotid bruits, no palpable lymphadenopathy, no  palpable goiter.  CHEST: Clinically clear to auscultation, no wheezes, no crackles.  HEART: Sounds 1 and 2 heard, normal, regular, no  murmurs.  ABDOMEN: Full, soft, non-tender, no palpable organomegaly, no palpable masses, normal bowel sounds.  GENITALIA: Not examined.  LOWER EXTREMITIES: No pitting edema, palpable peripheral pulses.  MUSCULOSKELETAL SYSTEM: Generalized osteoarthritic changes, otherwise, normal.  CENTRAL NERVOUS SYSTEM: No focal neurologic deficit on gross examination.  Discharge Instructions       Future Appointments Provider Department Dept Phone   04/29/2013 2:20 PM Beatrice Lecher, PA-C [facility] Main Office [place]) [phone]   06/04/2013 2:30 PM [name], MD [facility] [phone]       Medication List    ASK your doctor about these medications       albuterol 108 (90 BASE) MCG/ACT inhaler  Commonly known as:  PROVENTIL HFA;VENTOLIN HFA  Inhale 2 puffs into the lungs every 6 (six) hours as needed for wheezing or shortness of breath.     cholecalciferol 1000 UNITS tablet  Commonly known as:  VITAMIN D  Take 1,000 Units by mouth daily.     clopidogrel 75 MG tablet  Commonly known as:  PLAVIX  Take 1 tablet (75 mg total) by mouth daily.     cyanocobalamin 1000 MCG/ML injection  Commonly known as:  (VITAMIN B-12)  1 ml IM q 2 weeks     cyclobenzaprine 10 MG tablet  Commonly known as:  FLEXERIL  Take 5-10 mg by mouth 3 (three) times daily as needed. Muscle spasms.     diltiazem 180 MG 24 hr capsule  Commonly known as:  CARDIZEM CD  Take 1 capsule (180 mg total) by mouth daily.     DSS 100 MG Caps  Take 100 mg by mouth 2 (two) times daily.     folic acid 1 MG tablet  Commonly known as:  FOLVITE  Take 1 mg by mouth daily.     furosemide 20 MG tablet  Commonly known as:  LASIX  Take 1 tablet (20 mg total) by mouth daily.     gabapentin 300 MG capsule  Commonly known as:  NEURONTIN  TAKE ONE CAPSULE BY MOUTH AT BEDTIME     hydrOXYzine 10 MG tablet  Commonly known as:  ATARAX/VISTARIL  Take 10 mg by mouth 3 (three) times daily as  needed for anxiety.     hyoscyamine 0.125 MG tablet  Commonly known as:  LEVSIN, ANASPAZ  Take 0.125 mg by mouth 4 (four) times daily as needed. Gas pain.     levothyroxine 137 MCG tablet  Commonly known as:  SYNTHROID, LEVOTHROID  Take 1 tablet (137 mcg total) by mouth daily.     methotrexate 2.5 MG tablet  Commonly known as:  RHEUMATREX  Take 10 mg by mouth once a week. Takes 4 tablets weekly on  Sunday.   Caution:Chemotherapy. Protect from light.     pravastatin 40 MG tablet  Commonly known as:  PRAVACHOL  TAKE ONE TABLET BY MOUTH EVERY DAY     sertraline 50 MG tablet  Commonly known as:  ZOLOFT  Take 1 tablet (50 mg total) by mouth daily.     solifenacin 5 MG tablet  Commonly known as:  VESICARE  Take 1 tablet (5 mg total) by mouth 2 (two) times daily.     traMADol 50 MG tablet  Commonly known as:  ULTRAM  Take 1 tablet (50 mg total) by mouth every 6 (six) hours as  needed for pain.     traZODone 100 MG tablet  Commonly known as:  DESYREL  Take 100 mg by mouth at bedtime.     vitamin C 500 MG tablet  Commonly known as:  ASCORBIC ACID  Take 500 mg by mouth daily.          The results of significant diagnostics from this hospitalization (including imaging, microbiology, ancillary and laboratory) are listed below for reference.    Significant Diagnostic Studies: Dg Chest 1 View  04/17/2013  *RADIOLOGY REPORT*  Clinical Data: Status post right thoracentesis  CHEST - 1 VIEW  Comparison: Multiple prior images were reviewed including CT scan and chest x-rays dated 11/18/2012 and more recent chest x-ray dated 04/20 03/13/2013 and 12/25/2012  Findings: There is a small right apical and basilar pneumothorax with evidence of chronic atelectasis in the right lung base. Comparison to prior films reveal a similar appearance of the chest on prior radiographs and CT scan raising the possibility of a stuck lung and in pneumothorax ex vacuo.  There has been a significant decrease in  the volume of pleural fluid.  Slight shift of the cardiac and mediastinal structures in the left in the right indicating an element of volume loss.  No focal airspace consolidation or pulmonary edema.  IMPRESSION:  Small to moderate right apical and basilar pneumothorax, favor pneumothorax ex vacuo secondary to stuck lung.  The patient has had prior chronic hydropneumothorax on imaging dating back to at least November 2013.   Original Report Authenticated By: Malachy Moan, M.D.    Dg Chest 2 View  04/20/2013  *RADIOLOGY REPORT*  Clinical Data: Right pleural effusion.  Short of breath.  CHEST - 2 VIEW  Comparison: 04/18/2013.  Findings: Right pneumothorax remains present, with the pleural line posterior to the right third rib.  Right pleural effusion and collapse / consolidation of the right base appears similar to the prior exam.  The pleural effusion is small.    Cardiopericardial silhouette appears unchanged.  Left basilar atelectasis.  IMPRESSION:  1.  Persistent right hydropneumothorax.  Pleural line is posterior to the right posterior 3rd rib on today's exam, smaller than the prior exam 04/18/2013. 2.  Persistent right basilar small effusion and collapse / consolidation.   Original Report Authenticated By: Andreas Newport, M.D.    Dg Chest Port 1 View  04/18/2013  *RADIOLOGY REPORT*  Clinical Data: Evaluate right-sided pneumothorax  PORTABLE CHEST - 1 VIEW  Comparison: 04/17/2013; 04/16/2013; 12/25/2012; 11/19/2012  Findings: Grossly unchanged enlarged cardiac silhouette and mediastinal contours.  Interval increase in the amount of fluid with corresponding decrease in the now minimal amount of residual air within the previously identified presumably ex vacuo hydropneumothorax.  Persistent right-sided volume loss.  Lung volumes are slightly reduced with corresponding increasing bibasilar opacities, right greater than left.  No new focal airspace opacities.  Grossly unchanged bones including a possible  loose body within the right glenohumeral joint.  IMPRESSION: Interval increase in the amount of fluid within an otherwise grossly unchanged ex vacuo right-sided hydropneumothorax as has previously been demonstrated on remote chest radiograph performed 11/19/2012.   Original Report Authenticated By: Tacey Ruiz, MD    Dg Chest Port 1 View  04/17/2013  *RADIOLOGY REPORT*  Clinical Data: Evaluate right pneumothorax  PORTABLE CHEST - 1 VIEW  Comparison: Chest x-ray obtained earlier today 04/17/2013 at 14 09:00 p.m.  Findings: Stable small right apical and basilar pneumothorax with chronic atelectasis versus scarring in the right lower lobe. Persistent  left to right shift of the mediastinum and cardiac structures consistent with underlying volume loss.  Overall, no significant interval change in the appearance of the chest.  IMPRESSION:  Stable right apical and basilar pneumothorax.  Favor pneumothorax ex vacuo with chronic non expansion of the right middle and lower lobes.    Original Report Authenticated By: Malachy Moan, M.D.    Dg Chest Port 1 View  04/17/2013  *RADIOLOGY REPORT*  Clinical Data: Weakness.  Chills.  Status post fall.  PORTABLE CHEST - 1 VIEW  Comparison: 12/25/2012  Findings: Midline trachea.  Patient rotated to the right.  Moderate cardiomegaly.  A moderate right pleural effusion is increased since prior exam.  There is likely a small left pleural effusion. No pneumothorax.  No congestive failure.  Right base air space disease is increased.  Minimal left base subsegmental atelectasis.  IMPRESSION: Increased moderate right pleural effusion with adjacent atelectasis or infection. Recommend radiographic follow-up until clearing.  Minimal left base subsegmental atelectasis.  Possible small left pleural effusion.  Cardiomegaly without congestive failure.  No post-traumatic deformity identified.   Original Report Authenticated By: Jeronimo Greaves, M.D.    US Thoracentesis Asp Pleural Space W/img  Guide  04/17/2013  *RADIOLOGY REPORT*  Clinical Data:  Right-sided pleural effusion, fever.  Request for diagnostic and therapeutic thoracentesis  ULTRASOUND GUIDED right THORACENTESIS  Comparison:  None  An ultrasound guided thoracentesis was thoroughly discussed with the patient and questions answered.  The benefits, risks, alternatives and complications were also discussed.  The patient understands and wishes to proceed with the procedure.  Written consent was obtained.  Ultrasound was performed to localize and mark an adequate pocket of fluid in the right chest.  The area was then prepped and draped in the normal sterile fashion.  1% Lidocaine was used for local anesthesia.  Under ultrasound guidance a 19 gauge Yueh catheter was introduced.  Thoracentesis was performed.  The catheter was removed and a dressing applied.  Complications:  None immediate  Findings: A total of approximately 1.1 liters of clear yellow fluid was removed. A fluid sample was sent for laboratory analysis.  IMPRESSION: Successful ultrasound guided right thoracentesis yielding 1.1 liters of pleural fluid.  Read by Brayton El PA-C   Original Report Authenticated By: Malachy Moan, M.D.     Microbiology: Recent Results (from the past 240 hour(s))  CULTURE, BLOOD (ROUTINE X 2)     Status: None   Collection Time    04/16/13 11:25 PM      Result Value Range Status   Specimen Description BLOOD LEFT FOREARM   Final   Special Requests BOTTLES DRAWN AEROBIC AND ANAEROBIC EACH   Final   Culture  Setup Time 04/17/2013 06:58   Final   Culture     Final   Value:        BLOOD CULTURE RECEIVED NO GROWTH TO DATE CULTURE WILL BE HELD FOR 5 DAYS BEFORE ISSUING A FINAL NEGATIVE REPORT   Report Status PENDING   Incomplete  CULTURE, BLOOD (ROUTINE X 2)     Status: None   Collection Time    04/16/13 11:39 PM      Result Value Range Status   Specimen Description BLOOD LEFT ARM   Final   Special Requests BOTTLES DRAWN AEROBIC AND  ANAEROBIC EACH   Final   Culture  Setup Time 04/17/2013 06:58   Final   Culture     Final   Value:  BLOOD CULTURE RECEIVED NO GROWTH TO DATE CULTURE WILL BE HELD FOR 5 DAYS BEFORE ISSUING A FINAL NEGATIVE REPORT   Report Status PENDING   Incomplete  CULTURE, BLOOD (ROUTINE X 2)     Status: None   Collection Time    04/17/13  6:45 AM      Result Value Range Status   Specimen Description BLOOD RIGHT ARM   Final   Special Requests BOTTLES DRAWN AEROBIC ONLY 2CC   Final   Culture  Setup Time 04/17/2013 10:17   Final   Culture     Final   Value:        BLOOD CULTURE RECEIVED NO GROWTH TO DATE CULTURE WILL BE HELD FOR 5 DAYS BEFORE ISSUING A FINAL NEGATIVE REPORT   Report Status PENDING   Incomplete  CULTURE, BLOOD (ROUTINE X 2)     Status: None   Collection Time    04/17/13  6:57 AM      Result Value Range Status   Specimen Description BLOOD RIGHT HAND   Final   Special Requests BOTTLES DRAWN AEROBIC ONLY 5CC   Final   Culture  Setup Time 04/17/2013 10:17   Final   Culture     Final   Value:        BLOOD CULTURE RECEIVED NO GROWTH TO DATE CULTURE WILL BE HELD FOR 5 DAYS BEFORE ISSUING A FINAL NEGATIVE REPORT   Report Status PENDING   Incomplete  BODY FLUID CULTURE     Status: None   Collection Time    04/17/13  2:05 PM      Result Value Range Status   Specimen Description PLEURAL FLUID RIGHT   Final   Special Requests FLUID   Final   Gram Stain     Final   Value: NO WBC SEEN     NO ORGANISMS SEEN   Culture NO GROWTH 3 DAYS   Final   Report Status 04/20/2013 FINAL   Final     Labs: Basic Metabolic Panel:  Recent Labs Lab 04/16/13 2327 04/17/13 0657 04/19/13 0601 04/21/13 0518  NA 138 139 136 137  K 3.9 4.2 4.0 4.1  CL 101 103 98 99  CO2 29 26 25  33*  GLUCOSE 134* 99 90 100*  BUN 16 16 14 12   CREATININE 1.40* 1.45* 1.17 1.12  CALCIUM 8.8 8.6 8.2* 8.4   Liver Function Tests:  Recent Labs Lab 04/16/13 2327  AST 26  ALT 16  ALKPHOS 63  BILITOT 0.3   PROT 6.1  ALBUMIN 3.1*   No results found for this basename: LIPASE, AMYLASE,  in the last 168 hours No results found for this basename: AMMONIA,  in the last 168 hours CBC:  Recent Labs Lab 04/16/13 2327 04/17/13 0657 04/21/13 0518  WBC 7.8 6.6 5.5  NEUTROABS 5.7 3.2  --   HGB 12.5* 12.1* 11.9*  HCT 37.9* 36.1* 34.6*  MCV 100.8* 97.8 94.3  PLT 305 291 247   Cardiac Enzymes:  Recent Labs Lab 04/16/13 2327  TROPONINI <0.30   BNP: BNP (last 3 results)  Recent Labs  12/24/12 1131 12/24/12 1518 04/16/13 2327  PROBNP 2861.0* 2788.0* 1599.0*   CBG: No results found for this basename: GLUCAP,  in the last 168 hours     Signed:  Ramel Tobon,CHRISTOPHER  [facility] 04/22/2013, 11:50 AM

## 2013-04-21 NOTE — Consult Note (Signed)
Physical Medicine and Rehabilitation Consult   Reason for Consult: Deconditioning Referring Physician:  Dr. Brien Few   HPI: [name] is a 79 y.o. male with history of DM, COPD--non compliant with O2?, OSA, PMR, who was admitted on 04/16/13 past falls with one week history of chills and weakness. He was noted to be hypoxic and febrile at admission and was started on IV antibiotics for PNA. Patient with hypoxia with hyper carbia and worsening of right pleural effusion. He underwent right thoracocentesis of 1.1 Liter of clear liquid on 04/24 by IVR. PT evaluation done and patient noted to be deconditioned. PT, MD recommending CIR.   Patient wants to go home but states his son thinks he cannot manage at this point. Lives with wife who has Alzheimer's. There is a 24-hour caregiver for her  Review of Systems  HENT: Positive for hearing loss.   Eyes: Positive for blurred vision (Poor vision right  eye).  Respiratory: Positive for cough, sputum production, shortness of breath and wheezing.   Cardiovascular: Negative for chest pain and palpitations.  Gastrointestinal: Positive for abdominal pain (RUL mass causing discomfort. ).  Musculoskeletal: Positive for falls (multiple falls past 6 months due to BLE weakness.).  Psychiatric/Behavioral: Positive for memory loss (Report he gets dioriented. "Memory comes and goes." Poor recall of medical history. ).   Past Medical History  Diagnosis Date  . CAD (coronary artery disease)     s/p NSTEMI and BMS stent diagonal07/09;  Lexiscan Myoview 9/13:  EF 62%, no ischemia  . AS (aortic stenosis)     a.  Echo 2009 mean gradient 9mm HG. AVA 2.18;  b.  Echo 4/11 mild AS mean gradient 10mm HG;  c.  Echo 04/2011: Mild LVH, EF 55-60%, grade 1 diastolic dysfunction, mild aortic stenosis, mean gradient 14, mild LAE;  d. Echo 9/13: mod LVH, EF 50-55%, mild to mod AS, mean gradient 17 mmHg  . AF (paroxysmal atrial fibrillation)     Holter monitor 2.5 sec pauses  .  Fatigue     CPX 11/09 VO2  14.4 (75% predicte)d, slope 35.  O2 pulse normal.  VO2 corrected for body weight 17.6.  Marland Kitchen Hypothyroidism   . Hyperlipidemia   . History of prostate cancer   . Allergic rhinitis   . DM (diabetes mellitus)     type II diet controlled  . Shingles   . Left carotid stenosis   . Vitamin B 12 deficiency   . Anemia   . Osteoarthritis   . Nephrolithiasis   . Obesity   . OSA (obstructive sleep apnea)     AHI 15 PSG 09/06/08  . Hx of colonoscopy   . Stroke   . Anxiety and depression   . PMR (polymyalgia rheumatica) 05/29/2012  . Hypogonadism male 05/29/2012  . Hyperparathyroidism 05/29/2012  . TIA (transient ischemic attack) 05/29/2012  . CKD (chronic kidney disease) stage 4, GFR 15-29 ml/min 05/29/2012  . COPD (chronic obstructive pulmonary disease) 05/29/2012  . Myocardial infarction   . Cancer     prostate ca history   Past Surgical History  Procedure Laterality Date  . Appendectomy    . Hernia repair    . Right carpal tunnel release    . Lumbar spine surgery    . Cystectomy      neck  . Prostatectomy    . Vasectomy    . Total knee arthroplasty      right  . Cholecystectomy     Family History  Problem  Relation Age of Onset  . Stroke Mother 25  . Coronary artery disease      father, brother, sister  . Alcohol abuse Father 17   Social History: Married. Independent PTA with walker.  Wife with advanced dementia and has 24 hours assistance. He  reports that he quit smoking about 54 years ago. His smoking use included Cigarettes. He has a 12.5 pack-year smoking history. He has quit using smokeless tobacco. He reports that he does not drink alcohol or use illicit drugs.   Allergies  Allergen Reactions  . Bactrim (Sulfamethoxazole W-Trimethoprim) Other (See Comments)    hallucinations  . Horse-Derived Products Hives   Medications Prior to Admission  Medication Sig Dispense Refill  . albuterol (PROVENTIL HFA;VENTOLIN HFA) 108 (90 BASE) MCG/ACT inhaler  Inhale 2 puffs into the lungs every 6 (six) hours as needed for wheezing or shortness of breath.  1 Inhaler  5  . Ascorbic Acid (VITAMIN C) 500 MG tablet Take 500 mg by mouth daily.        . cholecalciferol (VITAMIN D) 1000 UNITS tablet Take 1,000 Units by mouth daily.      . clopidogrel (PLAVIX) 75 MG tablet Take 1 tablet (75 mg total) by mouth daily.  90 tablet  3  . cyanocobalamin (,VITAMIN B-12,) 1000 MCG/ML injection 1 ml IM q 2 weeks  10 mL  5  . cyclobenzaprine (FLEXERIL) 10 MG tablet Take 5-10 mg by mouth 3 (three) times daily as needed. Muscle spasms.      Marland Kitchen diltiazem (CARDIZEM CD) 180 MG 24 hr capsule Take 1 capsule (180 mg total) by mouth daily.      Marland Kitchen docusate sodium 100 MG CAPS Take 100 mg by mouth 2 (two) times daily.  10 capsule    . folic acid (FOLVITE) 1 MG tablet Take 1 mg by mouth daily.      . furosemide (LASIX) 20 MG tablet Take 1 tablet (20 mg total) by mouth daily.  30 tablet  3  . gabapentin (NEURONTIN) 300 MG capsule TAKE ONE CAPSULE BY MOUTH AT BEDTIME  30 capsule  5  . hydrOXYzine (ATARAX/VISTARIL) 10 MG tablet Take 10 mg by mouth 3 (three) times daily as needed for anxiety.      . hyoscyamine (LEVSIN, ANASPAZ) 0.125 MG tablet Take 0.125 mg by mouth 4 (four) times daily as needed. Gas pain.      Marland Kitchen levothyroxine (SYNTHROID, LEVOTHROID) 137 MCG tablet Take 1 tablet (137 mcg total) by mouth daily.  90 tablet  3  . methotrexate (RHEUMATREX) 2.5 MG tablet Take 10 mg by mouth once a week. Takes 4 tablets weekly on  Sunday.   Caution:Chemotherapy. Protect from light.      . pravastatin (PRAVACHOL) 40 MG tablet TAKE ONE TABLET BY MOUTH EVERY DAY  30 tablet  5  . sertraline (ZOLOFT) 50 MG tablet Take 1 tablet (50 mg total) by mouth daily.  90 tablet  3  . solifenacin (VESICARE) 5 MG tablet Take 1 tablet (5 mg total) by mouth 2 (two) times daily.  60 tablet  11  . traMADol (ULTRAM) 50 MG tablet Take 1 tablet (50 mg total) by mouth every 6 (six) hours as needed for pain.  120 tablet   2  . traZODone (DESYREL) 100 MG tablet Take 100 mg by mouth at bedtime.        Home: Home Living Lives With: Spouse;Other (Comment) (Live-in housekeeper (caregiver for wife with dementia)) Available Help at Discharge: Friend(s);Available PRN/intermittently;Available 24  hours/day (housekeeper cannot give much physical assist) Type of Home: House Home Access: Stairs to enter [employer] of Steps: 3 (front door) Entrance Stairs-Rails:  (to be determined) Home Layout: One level Bathroom Shower/Tub: Tub/shower unit Home Adaptive Equipment: Walker - rolling  Functional History: Prior Function Able to Take Stairs?: Yes Functional Status:  Mobility: Bed Mobility Bed Mobility: Supine to Sit;Sitting - Scoot to Edge of Bed Supine to Sit: 4: Min assist;With rails Sitting - Scoot to [place] of Bed: 4: Min assist;With rail Transfers Transfers: Sit to Stand;Stand to Sit Sit to Stand: 3: Mod assist;From bed Stand to Sit: 3: Mod assist;With armrests;With upper extremity assist;To chair/3-in-1 Ambulation/Gait Ambulation/Gait Assistance: 4: Min assist Ambulation Distance (Feet): 12 Feet Assistive device: Rolling walker Ambulation/Gait Assistance Details: Limited amb as pt became lightheaded with upright activity;  flexed posture; very slow cadence Gait Pattern: Decreased stride length    ADL:    Cognition: Cognition Overall Cognitive Status: Within Functional Limits for tasks assessed Arousal/Alertness: Awake/alert Orientation Level: Oriented to person;Oriented to place;Oriented to situation;Disoriented to time Cognition Arousal/Alertness: Awake/alert Behavior During Therapy: WFL for tasks assessed/performed Overall Cognitive Status: Within Functional Limits for tasks assessed  Blood pressure 149/71, pulse 65, temperature 98.4 F (36.9 C), temperature source Oral, resp. rate 19, height 5\' 8"  (1.727 m), weight 90 kg (198 lb 6.6 oz), SpO2 100.00%.  Physical Exam  Nursing note  and vitals reviewed. Constitutional: He is oriented to person, place, and time. He appears well-developed and well-nourished.  HENT:  Head: Normocephalic and atraumatic.  Eyes:  Right ptosis. L-pupil irregular.   Neck: Normal range of motion.  Cardiovascular: Normal rate and regular rhythm.   Pulmonary/Chest: Effort normal. He has decreased breath sounds in the right lower field. He has wheezes.  Congested cough.  Abdominal: Soft. Bowel sounds are normal. There is tenderness.  Musculoskeletal: He exhibits no edema and no tenderness.  Neurological: He is alert and oriented to person, place, and time.  Follows basic commands without difficulty. Memory impaired--unable to recall month or recent holiday without multiple cues. HOH. Impaired vision. Multiple somatic complaints.   Skin: Skin is warm and dry.  Motor: 4/5 strength in bilateral deltoid, biceps, triceps, grip, hip flexor, knee extensors, ankle dorsiflexor and plantar flexor Sensory intact to light touch and both upper and lower limbs  Results for orders placed during the hospital encounter of 04/16/13 (from the past 24 hour(s))  CBC     Status: Abnormal   Collection Time    04/21/13  5:18 AM      Result Value Range   WBC 5.5  4.0 - 10.5 K/uL   RBC 3.67 (*) 4.22 - 5.81 MIL/uL   Hemoglobin 11.9 (*) 13.0 - 17.0 g/dL   HCT 21.3 (*) 08.6 - 57.8 %   MCV 94.3  78.0 - 100.0 fL   MCH 32.4  26.0 - 34.0 pg   MCHC 34.4  30.0 - 36.0 g/dL   RDW 46.9  62.9 - 52.8 %   Platelets 247  150 - 400 K/uL  BASIC METABOLIC PANEL     Status: Abnormal   Collection Time    04/21/13  5:18 AM      Result Value Range   Sodium 137  135 - 145 mEq/L   Potassium 4.1  3.5 - 5.1 mEq/L   Chloride 99  96 - 112 mEq/L   CO2 33 (*) 19 - 32 mEq/L   Glucose, Bld 100 (*) 70 - 99 mg/dL   BUN 12  6 - 23 mg/dL   Creatinine, Ser 6.21  0.50 - 1.35 mg/dL   Calcium 8.4  8.4 - 30.8 mg/dL   GFR calc non Af Amer 57 (*) >90 mL/min   GFR calc Af Amer 66 (*) >90 mL/min    Dg Chest 2 View  04/20/2013  *RADIOLOGY REPORT*  Clinical Data: Right pleural effusion.  Short of breath.  CHEST - 2 VIEW  Comparison: 04/18/2013.  Findings: Right pneumothorax remains present, with the pleural line posterior to the right third rib.  Right pleural effusion and collapse / consolidation of the right base appears similar to the prior exam.  The pleural effusion is small.    Cardiopericardial silhouette appears unchanged.  Left basilar atelectasis.  IMPRESSION:  1.  Persistent right hydropneumothorax.  Pleural line is posterior to the right posterior 3rd rib on today's exam, smaller than the prior exam 04/18/2013. 2.  Persistent right basilar small effusion and collapse / consolidation.   Original Report Authenticated By: Andreas Newport, M.D.     Assessment/Plan: Diagnosis: Deconditioning due to to pneumonia 1. Does the need for close, 24 hr/day medical supervision in concert with the patient's rehab needs make it unreasonable for this patient to be served in a less intensive setting? Yes 2. Co-Morbidities requiring supervision/potential complications: Aortic stenosis, atrial fibrillation, chronic kidney disease, COPD 3. Due to bladder management, bowel management, safety, skin/wound care, disease management, medication administration and patient education, does the patient require 24 hr/day rehab nursing? Yes 4. Does the patient require coordinated care of a physician, rehab nurse, PT (1-2 hrs/day, 5 days/week) and OT (1-2 hrs/day, 5 days/week) to address physical and functional deficits in the context of the above medical diagnosis(es)? Potentially Addressing deficits in the following areas: balance, endurance, locomotion, strength, transferring, bowel/bladder control, bathing, dressing, feeding, grooming and toileting 5. Can the patient actively participate in an intensive therapy program of at least 3 hrs of therapy per day at least 5 days per week? Yes 6. The potential for patient  to make measurable gains while on inpatient rehab is good 7. Anticipated functional outcomes upon discharge from inpatient rehab are Supervision mobility with PT, Supervision ADLs with OT, Not applicable with SLP. 8. Estimated rehab length of stay to reach the above functional goals is:7-10 days 9. Does the patient have adequate social supports to accommodate these discharge functional goals? Potentially 10. Anticipated D/C setting: Home 11. Anticipated post D/C treatments: HH therapy 12. Overall Rehab/Functional Prognosis: excellent  RECOMMENDATIONS: This patient's condition is appropriate for continued rehabilitative care in the following setting: CIR Patient has agreed to participate in recommended program. Potentially Note that insurance prior authorization may be required for reimbursement for recommended care.  Comment: Patient would like to go home but will discuss the recommendation for CIR with his son, ready to come at any time otherwise   04/21/2013

## 2013-04-21 NOTE — Progress Notes (Signed)
[name] PROGRESS NOTE  [name]:[id number] DOB: 23-Aug-1923 DOA: 04/16/2013 PCP: [name], MD  Assessment/Plan: Principal Problem:   PNA (pneumonia) Active Problems:   HYPOTHYROIDISM   AORTIC STENOSIS/ INSUFFICIENCY, NON-RHEUMATIC   AF (paroxysmal atrial fibrillation)   CKD (chronic kidney disease) stage 4, GFR 15-29 ml/min   COPD (chronic obstructive pulmonary disease)   General weakness   Pleural effusion     1. PNA (pneumonia) with acute on chronic respiratory failure/ COPD/Pleural effusion:  Patient presented with fever, cough, and hypoxia, with a temperature of greater than 102. Chest radiograph showed worsening right-sided pleural effusion. This was felt to parapneumonic, due to CAP. He was managed with iv Rocephin/vancomycin/Azithromycin. Urine for legionella and s. pneumo negative. Patient underwent right-sided thoracocentesis on 04/17/13, with evacuation of 1.1 liter fluid. Cultures have remained negative so far, patient has improved clinicaly, defervesced and wcc is normal. Vancomycin  Has been discontinued today. CXR of 04/18/13 revealed Interval increase in the amount of fluid within an otherwise grossly unchanged ex vacuo right-sided hydropneumothorax. Had intended to seek pulmonology opinion on 04/21/13, but repeat CXR of 04/20/13, revealed interval diminution of effusion, which is really now quite small. Obviously, patient has responded to therapy.    2. PAF (paroxysmal atrial fibrillation): Rate controlled on Cardizem CD.  3. CKD (chronic kidney disease) stage 4, GFR 15-29 ml/min: At presentation, creatinine was 1.40. This appears to be patent's baseline. Followed renal indices, and as of 04/19/13, creatinine was 1.17. 4. General weakness: Due to deconditioning and recent acute illness. Evaluated by PT/OTand CIR recommended. Rehab MD consult was provided by Dr Claudette Laws, and patient is considered a suitable candidate.  5. Hypothyroidism: Continued on  Synthroid.  6. HTN: BP remains sub-optimally controlled, on Cardizem CD. Add Hydralazine to treatment on 04/20/13.  7. Query Depression: On 04/21/13, patient complained of feeling depressed. Per patient, he was on antidepressants many years ago, but none, recently. Have requested psychiatric consultaion, and Dr Carmelina Dane will see patient on 04/22/13.    Code Status: Full Code.  Family Communication:  Disposition Plan: To be determined. For CIR, as soon as bed is available.    Brief narrative: 79 y.o. male with a PMH of chronic respiratory failure, noncompliant with home oxygen, obstructive sleep apnea, right-sided pleural effusion, aortic stenosis, atrial fibrillation, and chronic kidney disease who presented to the [facility] ER with fever, cough, and hypoxia on 04/17/2013. He was febrile on admission with a temperature of greater than 102. Chest radiograph showed worsening right-sided pleural effusion. He underwent thoracentesis on 04/17/13.    Consultants:  N/A.   Procedures:  CXRs.  Right thoracocentesis 04/17/13.   Antibiotics:  Rocephin/Azithromycin/Vancomycin 04/16/13>>>  HPI/Subjective: No new issues.   Objective: Vital signs in last 24 hours: Temp:  [98.2 F (36.8 C)-98.4 F (36.9 C)] 98.2 F (36.8 C) (04/28 1418) Pulse Rate:  [64-66] 66 (04/28 1418) Resp:  [18-19] 18 (04/28 1418) BP: (140-152)/(54-71) 152/61 mmHg (04/28 1418) SpO2:  [93 %-100 %] 98 % (04/28 1418) Weight change:  Last BM Date: 04/21/13  Intake/Output from previous day: 04/27 0701 - 04/28 0700 In: 720 [P.O.:720] Out: 1951 [Urine:1950; Stool:1]     Physical Exam: General: Comfortable, alert, communicative, fully oriented, not short of breath at rest.  HEENT:  No clinical pallor, no jaundice, no conjunctival injection or discharge. NECK:  Supple, JVP not seen, no carotid bruits, no palpable lymphadenopathy, no palpable goiter. CHEST:  Clinically clear to auscultation, no wheezes, no crackles. HEART:   Sounds 1  and 2 heard, normal, regular, no murmurs. ABDOMEN:  Full, soft, non-tender, no palpable organomegaly, no palpable masses, normal bowel sounds. GENITALIA:  Not examined. LOWER EXTREMITIES:  No pitting edema, palpable peripheral pulses. MUSCULOSKELETAL SYSTEM:  Generalized osteoarthritic changes, otherwise, normal. CENTRAL NERVOUS SYSTEM:  No focal neurologic deficit on gross examination.  Lab Results:  Recent Labs  04/21/13 0518  WBC 5.5  HGB 11.9*  HCT 34.6*  PLT 247    Recent Labs  04/19/13 0601 04/21/13 0518  NA 136 137  K 4.0 4.1  CL 98 99  CO2 25 33*  GLUCOSE 90 100*  BUN 14 12  CREATININE 1.17 1.12  CALCIUM 8.2* 8.4   Recent Results (from the past 240 hour(s))  CULTURE, BLOOD (ROUTINE X 2)     Status: None   Collection Time    04/16/13 11:25 PM      Result Value Range Status   Specimen Description BLOOD LEFT FOREARM   Final   Special Requests BOTTLES DRAWN AEROBIC AND ANAEROBIC EACH   Final   Culture  Setup Time 04/17/2013 06:58   Final   Culture     Final   Value:        BLOOD CULTURE RECEIVED NO GROWTH TO DATE CULTURE WILL BE HELD FOR 5 DAYS BEFORE ISSUING A FINAL NEGATIVE REPORT   Report Status PENDING   Incomplete  CULTURE, BLOOD (ROUTINE X 2)     Status: None   Collection Time    04/16/13 11:39 PM      Result Value Range Status   Specimen Description BLOOD LEFT ARM   Final   Special Requests BOTTLES DRAWN AEROBIC AND ANAEROBIC EACH   Final   Culture  Setup Time 04/17/2013 06:58   Final   Culture     Final   Value:        BLOOD CULTURE RECEIVED NO GROWTH TO DATE CULTURE WILL BE HELD FOR 5 DAYS BEFORE ISSUING A FINAL NEGATIVE REPORT   Report Status PENDING   Incomplete  CULTURE, BLOOD (ROUTINE X 2)     Status: None   Collection Time    04/17/13  6:45 AM      Result Value Range Status   Specimen Description BLOOD RIGHT ARM   Final   Special Requests BOTTLES DRAWN AEROBIC ONLY 2CC   Final   Culture  Setup Time 04/17/2013 10:17   Final    Culture     Final   Value:        BLOOD CULTURE RECEIVED NO GROWTH TO DATE CULTURE WILL BE HELD FOR 5 DAYS BEFORE ISSUING A FINAL NEGATIVE REPORT   Report Status PENDING   Incomplete  CULTURE, BLOOD (ROUTINE X 2)     Status: None   Collection Time    04/17/13  6:57 AM      Result Value Range Status   Specimen Description BLOOD RIGHT HAND   Final   Special Requests BOTTLES DRAWN AEROBIC ONLY 5CC   Final   Culture  Setup Time 04/17/2013 10:17   Final   Culture     Final   Value:        BLOOD CULTURE RECEIVED NO GROWTH TO DATE CULTURE WILL BE HELD FOR 5 DAYS BEFORE ISSUING A FINAL NEGATIVE REPORT   Report Status PENDING   Incomplete  BODY FLUID CULTURE     Status: None   Collection Time    04/17/13  2:05 PM      Result Value  Range Status   Specimen Description PLEURAL FLUID RIGHT   Final   Special Requests FLUID   Final   Gram Stain     Final   Value: NO WBC SEEN     NO ORGANISMS SEEN   Culture NO GROWTH 3 DAYS   Final   Report Status 04/20/2013 FINAL   Final     Studies/Results: Dg Chest 2 View  04/20/2013  *RADIOLOGY REPORT*  Clinical Data: Right pleural effusion.  Short of breath.  CHEST - 2 VIEW  Comparison: 04/18/2013.  Findings: Right pneumothorax remains present, with the pleural line posterior to the right third rib.  Right pleural effusion and collapse / consolidation of the right base appears similar to the prior exam.  The pleural effusion is small.    Cardiopericardial silhouette appears unchanged.  Left basilar atelectasis.  IMPRESSION:  1.  Persistent right hydropneumothorax.  Pleural line is posterior to the right posterior 3rd rib on today's exam, smaller than the prior exam 04/18/2013. 2.  Persistent right basilar small effusion and collapse / consolidation.   Original Report Authenticated By: Andreas Newport, M.D.     Medications: Scheduled Meds: . clopidogrel  75 mg Oral Daily  . diltiazem  180 mg Oral Daily  . enoxaparin (LOVENOX) injection  40 mg  Subcutaneous Q24H  . furosemide  20 mg Oral Daily  . hydrALAZINE  50 mg Oral Q8H  . levofloxacin  750 mg Oral Daily  . levothyroxine  137 mcg Oral Q breakfast  . sodium chloride  3 mL Intravenous Q12H   Continuous Infusions:  PRN Meds:.sodium chloride, acetaminophen, albuterol, sodium chloride, traMADol    LOS: 5 days   Refoel Palladino,CHRISTOPHER  [name] Pager [phone]. If 8PM-8AM, please contact night-coverage at [email or web address], password [facility] 04/21/2013, 7:13 PM  LOS: 5 days

## 2013-04-22 ENCOUNTER — Inpatient Hospital Stay (HOSPITAL_COMMUNITY)
Admission: RE | Admit: 2013-04-22 | Discharge: 2013-04-30 | DRG: 945 | Disposition: A | Discharge status: Home Health | Payer: Medicare Other | Source: Intra-hospital | Attending: Physical Medicine & Rehabilitation | Admitting: Physical Medicine & Rehabilitation

## 2013-04-22 DIAGNOSIS — G4733 Obstructive sleep apnea (adult) (pediatric): Secondary | ICD-10-CM

## 2013-04-22 DIAGNOSIS — Z9119 Patient's noncompliance with other medical treatment and regimen: Secondary | ICD-10-CM

## 2013-04-22 DIAGNOSIS — Z9861 Coronary angioplasty status: Secondary | ICD-10-CM

## 2013-04-22 DIAGNOSIS — Z91199 Patient's noncompliance with other medical treatment and regimen due to unspecified reason: Secondary | ICD-10-CM

## 2013-04-22 DIAGNOSIS — J90 Pleural effusion, not elsewhere classified: Secondary | ICD-10-CM | POA: Diagnosis present

## 2013-04-22 DIAGNOSIS — Z9181 History of falling: Secondary | ICD-10-CM

## 2013-04-22 DIAGNOSIS — M1990 Unspecified osteoarthritis, unspecified site: Secondary | ICD-10-CM

## 2013-04-22 DIAGNOSIS — E785 Hyperlipidemia, unspecified: Secondary | ICD-10-CM

## 2013-04-22 DIAGNOSIS — Z8249 Family history of ischemic heart disease and other diseases of the circulatory system: Secondary | ICD-10-CM

## 2013-04-22 DIAGNOSIS — Z9852 Vasectomy status: Secondary | ICD-10-CM

## 2013-04-22 DIAGNOSIS — E119 Type 2 diabetes mellitus without complications: Secondary | ICD-10-CM | POA: Diagnosis present

## 2013-04-22 DIAGNOSIS — Z7902 Long term (current) use of antithrombotics/antiplatelets: Secondary | ICD-10-CM

## 2013-04-22 DIAGNOSIS — F4321 Adjustment disorder with depressed mood: Secondary | ICD-10-CM

## 2013-04-22 DIAGNOSIS — I129 Hypertensive chronic kidney disease with stage 1 through stage 4 chronic kidney disease, or unspecified chronic kidney disease: Secondary | ICD-10-CM

## 2013-04-22 DIAGNOSIS — J159 Unspecified bacterial pneumonia: Secondary | ICD-10-CM

## 2013-04-22 DIAGNOSIS — I2510 Atherosclerotic heart disease of native coronary artery without angina pectoris: Secondary | ICD-10-CM

## 2013-04-22 DIAGNOSIS — Z79899 Other long term (current) drug therapy: Secondary | ICD-10-CM

## 2013-04-22 DIAGNOSIS — Z8673 Personal history of transient ischemic attack (TIA), and cerebral infarction without residual deficits: Secondary | ICD-10-CM

## 2013-04-22 DIAGNOSIS — I359 Nonrheumatic aortic valve disorder, unspecified: Secondary | ICD-10-CM

## 2013-04-22 DIAGNOSIS — R5381 Other malaise: Secondary | ICD-10-CM

## 2013-04-22 DIAGNOSIS — I4891 Unspecified atrial fibrillation: Secondary | ICD-10-CM

## 2013-04-22 DIAGNOSIS — E039 Hypothyroidism, unspecified: Secondary | ICD-10-CM

## 2013-04-22 DIAGNOSIS — E669 Obesity, unspecified: Secondary | ICD-10-CM

## 2013-04-22 DIAGNOSIS — Z6379 Other stressful life events affecting family and household: Secondary | ICD-10-CM

## 2013-04-22 DIAGNOSIS — F3289 Other specified depressive episodes: Secondary | ICD-10-CM | POA: Diagnosis present

## 2013-04-22 DIAGNOSIS — J449 Chronic obstructive pulmonary disease, unspecified: Secondary | ICD-10-CM

## 2013-04-22 DIAGNOSIS — Z96659 Presence of unspecified artificial knee joint: Secondary | ICD-10-CM

## 2013-04-22 DIAGNOSIS — Z8546 Personal history of malignant neoplasm of prostate: Secondary | ICD-10-CM

## 2013-04-22 DIAGNOSIS — E538 Deficiency of other specified B group vitamins: Secondary | ICD-10-CM

## 2013-04-22 DIAGNOSIS — N184 Chronic kidney disease, stage 4 (severe): Secondary | ICD-10-CM

## 2013-04-22 DIAGNOSIS — Z87891 Personal history of nicotine dependence: Secondary | ICD-10-CM

## 2013-04-22 DIAGNOSIS — R0902 Hypoxemia: Secondary | ICD-10-CM

## 2013-04-22 DIAGNOSIS — H9190 Unspecified hearing loss, unspecified ear: Secondary | ICD-10-CM

## 2013-04-22 DIAGNOSIS — J9610 Chronic respiratory failure, unspecified whether with hypoxia or hypercapnia: Secondary | ICD-10-CM

## 2013-04-22 DIAGNOSIS — J189 Pneumonia, unspecified organism: Secondary | ICD-10-CM | POA: Diagnosis present

## 2013-04-22 DIAGNOSIS — Z5189 Encounter for other specified aftercare: Principal | ICD-10-CM

## 2013-04-22 DIAGNOSIS — Z823 Family history of stroke: Secondary | ICD-10-CM

## 2013-04-22 DIAGNOSIS — J4489 Other specified chronic obstructive pulmonary disease: Secondary | ICD-10-CM

## 2013-04-22 DIAGNOSIS — M353 Polymyalgia rheumatica: Secondary | ICD-10-CM

## 2013-04-22 DIAGNOSIS — I10 Essential (primary) hypertension: Secondary | ICD-10-CM | POA: Diagnosis present

## 2013-04-22 DIAGNOSIS — F329 Major depressive disorder, single episode, unspecified: Secondary | ICD-10-CM | POA: Diagnosis present

## 2013-04-22 DIAGNOSIS — I252 Old myocardial infarction: Secondary | ICD-10-CM

## 2013-04-22 MED ORDER — TRAZODONE HCL 50 MG PO TABS
50.0000 mg | ORAL_TABLET | Freq: Every evening | ORAL | Status: DC | PRN
  Administered 2013-04-22 – 2013-04-29 (×8): 50 mg via ORAL
  Filled 2013-04-22 (×8): qty 1

## 2013-04-22 MED ORDER — PROCHLORPERAZINE EDISYLATE 5 MG/ML IJ SOLN
5.0000 mg | Freq: Four times a day (QID) | INTRAMUSCULAR | Status: DC | PRN
  Filled 2013-04-22: qty 2

## 2013-04-22 MED ORDER — LEVOFLOXACIN 750 MG PO TABS
750.0000 mg | ORAL_TABLET | Freq: Every day | ORAL | Status: AC
  Administered 2013-04-23 – 2013-04-25 (×3): 750 mg via ORAL
  Filled 2013-04-22 (×3): qty 1

## 2013-04-22 MED ORDER — CLOPIDOGREL BISULFATE 75 MG PO TABS
75.0000 mg | ORAL_TABLET | Freq: Every day | ORAL | Status: DC
  Administered 2013-04-23 – 2013-04-30 (×8): 75 mg via ORAL
  Filled 2013-04-22 (×9): qty 1

## 2013-04-22 MED ORDER — BISACODYL 10 MG RE SUPP: 10.0000 mg | Freq: Every day | RECTAL | Status: DC | PRN

## 2013-04-22 MED ORDER — ENOXAPARIN SODIUM 30 MG/0.3ML ~~LOC~~ SOLN
30.0000 mg | SUBCUTANEOUS | Status: DC
Start: 2013-04-22 — End: 2013-04-22

## 2013-04-22 MED ORDER — ALUM & MAG HYDROXIDE-SIMETH 200-200-20 MG/5ML PO SUSP
30.0000 mL | ORAL | Status: DC | PRN
  Administered 2013-04-23 – 2013-04-29 (×4): 30 mL via ORAL
  Filled 2013-04-22 (×4): qty 30

## 2013-04-22 MED ORDER — POLYETHYLENE GLYCOL 3350 17 G PO PACK
17.0000 g | PACK | Freq: Every day | ORAL | Status: DC | PRN
  Filled 2013-04-22: qty 1

## 2013-04-22 MED ORDER — SERTRALINE HCL 50 MG PO TABS
50.0000 mg | ORAL_TABLET | Freq: Every day | ORAL | Status: DC
  Administered 2013-04-23 – 2013-04-30 (×8): 50 mg via ORAL
  Filled 2013-04-22 (×9): qty 1

## 2013-04-22 MED ORDER — FUROSEMIDE 20 MG PO TABS
20.0000 mg | ORAL_TABLET | Freq: Every day | ORAL | Status: DC
  Administered 2013-04-23 – 2013-04-29 (×7): 20 mg via ORAL
  Filled 2013-04-22 (×9): qty 1

## 2013-04-22 MED ORDER — FLEET ENEMA 7-19 GM/118ML RE ENEM: 1.0000 | ENEMA | Freq: Once | RECTAL | Status: AC | PRN

## 2013-04-22 MED ORDER — PROCHLORPERAZINE 25 MG RE SUPP
12.5000 mg | Freq: Four times a day (QID) | RECTAL | Status: DC | PRN
  Filled 2013-04-22: qty 1

## 2013-04-22 MED ORDER — PROCHLORPERAZINE MALEATE 5 MG PO TABS
5.0000 mg | ORAL_TABLET | Freq: Four times a day (QID) | ORAL | Status: DC | PRN
  Administered 2013-04-23: 10 mg via ORAL
  Administered 2013-04-24: 5 mg via ORAL
  Filled 2013-04-22: qty 2

## 2013-04-22 MED ORDER — ALBUTEROL SULFATE HFA 108 (90 BASE) MCG/ACT IN AERS: 2.0000 | INHALATION_SPRAY | Freq: Four times a day (QID) | RESPIRATORY_TRACT | Status: DC | PRN

## 2013-04-22 MED ORDER — GUAIFENESIN-DM 100-10 MG/5ML PO SYRP: 5.0000 mL | ORAL_SOLUTION | Freq: Four times a day (QID) | ORAL | Status: DC | PRN

## 2013-04-22 MED ORDER — DILTIAZEM HCL ER COATED BEADS 180 MG PO CP24
180.0000 mg | ORAL_CAPSULE | Freq: Every day | ORAL | Status: DC
  Administered 2013-04-23 – 2013-04-30 (×8): 180 mg via ORAL
  Filled 2013-04-22 (×9): qty 1

## 2013-04-22 MED ORDER — LEVOTHYROXINE SODIUM 137 MCG PO TABS
137.0000 ug | ORAL_TABLET | Freq: Every day | ORAL | Status: DC
  Administered 2013-04-23 – 2013-04-30 (×8): 137 ug via ORAL
  Filled 2013-04-22 (×9): qty 1

## 2013-04-22 MED ORDER — ACETAMINOPHEN 325 MG PO TABS
325.0000 mg | ORAL_TABLET | ORAL | Status: DC | PRN
  Administered 2013-04-24 (×2): 650 mg via ORAL
  Filled 2013-04-22 (×3): qty 2

## 2013-04-22 MED ORDER — TRAMADOL HCL 50 MG PO TABS
50.0000 mg | ORAL_TABLET | Freq: Four times a day (QID) | ORAL | Status: DC | PRN
  Administered 2013-04-22 – 2013-04-25 (×4): 50 mg via ORAL
  Filled 2013-04-22 (×4): qty 1

## 2013-04-22 MED ORDER — HYDRALAZINE HCL 50 MG PO TABS
50.0000 mg | ORAL_TABLET | Freq: Three times a day (TID) | ORAL | Status: DC
  Administered 2013-04-22 – 2013-04-23 (×2): 50 mg via ORAL
  Filled 2013-04-22 (×5): qty 1

## 2013-04-22 MED ORDER — ENOXAPARIN SODIUM 40 MG/0.4ML ~~LOC~~ SOLN
40.0000 mg | Freq: Every day | SUBCUTANEOUS | Status: DC
  Administered 2013-04-23 – 2013-04-30 (×8): 40 mg via SUBCUTANEOUS
  Filled 2013-04-22 (×9): qty 0.4

## 2013-04-22 NOTE — Care Management Note (Signed)
    Page 1 of 1   04/22/2013     3:21:35 PM   CARE MANAGEMENT NOTE 04/22/2013  Patient:  [name], [name]   Account Number:  [id number]  Date Initiated:  04/18/2013  Documentation initiated by:  Jiovanni Heeter  Subjective/Objective Assessment:   PT ADM ON 04/16/13 WITH PNA, PLEURAL EFFUSION.  PTA, PT LIVES WITH WIFE AND HAS SUPPORTIVE SON.     Action/Plan:   PT/OT RECOMMENDING CIR PRIOR TO DC HOME.   Anticipated DC Date:  04/22/2013   Anticipated DC Plan:  IP REHAB FACILITY      DC Planning Services  CM consult      Choice offered to / List presented to:             Status of service:  Completed, signed off Medicare Important Message given?   (If response is "NO", the following Medicare IM given date fields will be blank) Date Medicare IM given:   Date Additional Medicare IM given:    Discharge Disposition:  IP REHAB FACILITY  Per UR Regulation:  Reviewed for med. necessity/level of care/duration of stay  If discussed at Long Length of Stay Meetings, dates discussed:    Comments:  04/22/13 Chattie Greeson,RN,BSN [phone] PT ACCEPTED FOR INPATIENT REHAB UNIT, AND BED AVAILABLE TODAY.  STABLE FOR DC, PER MD.  04/21/13 Ramia Sidney,RN,BSN [phone] REHAB CONSULT IN PROGRESS.

## 2013-04-22 NOTE — Plan of Care (Signed)
Overall Plan of Care [facility]) Patient Details Name: [name] MRN: [id number] DOB: Jul 21, 1923  Diagnosis:  SEVERE DECONDITIONING    Co-morbidities: OA, CAP, CKD  Functional Problem List  Patient demonstrates impairments in the following areas: Balance, Bladder, Endurance and Motor  Basic ADL's: grooming, bathing, dressing and toileting Advanced ADL's: simple meal preparation  Transfers:  bed mobility, bed to chair, toilet, tub/shower, car and furniture Locomotion:  ambulation and stairs  Additional Impairments:  Leisure Awareness and Discharge Disposition  Anticipated Outcomes Item Anticipated Outcome  Eating/Swallowing    Basic self-care  SUPERVISION  Tolieting  SUPERVISION  Bowel/Bladder  Independent   Transfers  Mod I  Locomotion  supervision  Communication    Cognition    Pain  Independent  Safety/Judgment    Other     Therapy Plan: PT Intensity: Minimum of 1-2 x/day ,45 to 90 minutes PT Frequency: 5 out of 7 days PT Duration Estimated Length of Stay: 7-10 days OT Intensity: Minimum of 1-2 x/day, 45 to 90 minutes OT Frequency: 5 out of 7 days OT Duration/Estimated Length of Stay: 10 days      Team Interventions: Item RN PT OT SLP SW TR Other  Self Care/Advanced ADL Retraining   x      Neuromuscular Re-Education  x x      Therapeutic Activities  x x      UE/LE Strength Training/ROM  x x      UE/LE Coordination Activities  x x      Visual/Perceptual Remediation/Compensation         DME/Adaptive Equipment Instruction  x x      Therapeutic Exercise  x x      Balance/Vestibular Training  x x      Patient/Family Education  x x      Cognitive Remediation/Compensation         Functional Mobility Training  x x      Ambulation/Gait Training  x       Museum/gallery curator  x       Wheelchair Propulsion/Positioning  x       Functional Musician  x       Dysphagia/Aspiration Sales executive         Bladder Management X        Bowel Management X        Disease Management/Prevention X        Pain Management X x x      Medication Management X        Skin Care/Wound Management X        Splinting/Orthotics X x       Discharge Planning X x x      Psychosocial Support X  x                             Team Discharge Planning: Destination: PT-Home ,OT- Home , SLP-  Projected Follow-up: PT-Home health PT, OT-  Home health OT, SLP-  Projected Equipment Needs: PT- , OT-  , SLP-  Patient/family involved in discharge planning: PT- Patient,  OT-Patient, SLP-   MD ELOS: 7-10 DAYS  Medical Rehab Prognosis:  Excellent Assessment: The patient has been admitted for CIR therapies. The team will be addressing, functional mobility, strength, stamina, balance, safety, adaptive techniques/equipment, self-care, bowel and bladder mgt, patient  and caregiver education, pain mgt, right knee stability. Goals have been set generally at supervision to Don Fishman, MD, [facility]      See Team Conference Notes for weekly updates to the plan of care

## 2013-04-22 NOTE — PMR Pre-admission (Signed)
PMR Admission Coordinator Pre-Admission Assessment Patient: [name] is an 79 y.o., male MRN: [id number] DOB: [birth year] Height: 5\' 8"  (172.7 cm) Weight: 90 kg (198 lb 6.6 oz)              Insurance Information HMO:     PPO:      PCP:      IPA:      80/20: yes     OTHER: no HMO PRIMARY: medicare a and b      Policy#: [id number]      Subscriber: pt Benefits:  Phone #: [email or web address]     Name: 04/21/13 Eff. Date: 12/25/1989     Deduct: $1216      Out of Pocket Max: none      Life Max: none CIR: 100%      SNF: 20 full days LBD 01/07/13 where he used 18 days of full SNF coverage at [facility]. He left on his [facility] Outpatient: 80%     Co-Pay: 20% Home Health: 100%      Co-Pay: none DME: 80%     Co-Pay: 20% Providers: pt choice  SECONDARY: [employer]      Policy#: [id number]      Subscriber: pt No auth required with medicare primary  Emergency Contact Information Contact Information   Name Relation Home Work Princess Anne Daughter [phone]    Yee, Joss [phone]  [phone]     Current Medical History  Patient Admitting Diagnosis: Deconditioning due to pneumonia  History of Present Illness: [name] is a 79 y.o. male with history of DM, COPD--non compliant with O2?, OSA, PMR, who was admitted on 04/16/13 past falls with one week history of chills and weakness. He was noted to be hypoxic and febrile at admission and was started on IV antibiotics for PNA. Patient with hypoxia with hyper carbia and worsening of right pleural effusion. He underwent right thoracocentesis of 1.1 Liter of clear liquid on 04/24 by IVR.  Patient on oral Levaquin began 04/22/13 and to complete antibiotics on 04/26/13. On 04/21/13, patient complained of feeling depressed. Per patient, he was on antidepressants many years ago, but none, recently. Have requested psychiatric consultaion, and Dr Carmelina Dane will see patient on 04/22/13. Noted in pt office visits, ongoing  discussions with primary MD concerning antidepressants. Patient reports falls times 3 in past 6 months. Twice from his bed which is high as he got out of bed. Past Medical History  Past Medical History  Diagnosis Date  . CAD (coronary artery disease)     s/p NSTEMI and BMS stent diagonal07/09;  Lexiscan Myoview 9/13:  EF 62%, no ischemia  . AS (aortic stenosis)     a.  Echo 2009 mean gradient 9mm HG. AVA 2.18;  b.  Echo 4/11 mild AS mean gradient 10mm HG;  c.  Echo 04/2011: Mild LVH, EF 55-60%, grade 1 diastolic dysfunction, mild aortic stenosis, mean gradient 14, mild LAE;  d. Echo 9/13: mod LVH, EF 50-55%, mild to mod AS, mean gradient 17 mmHg  . AF (paroxysmal atrial fibrillation)     Holter monitor 2.5 sec pauses  . Fatigue     CPX 11/09 VO2  14.4 (75% predicte)d, slope 35.  O2 pulse normal.  VO2 corrected for body weight 17.6.  Marland Kitchen Hypothyroidism   . Hyperlipidemia   . History of prostate cancer   . Allergic rhinitis   . DM (diabetes mellitus)     type II diet controlled  .  Shingles   . Left carotid stenosis   . Vitamin B 12 deficiency   . Anemia   . Osteoarthritis   . Nephrolithiasis   . Obesity   . OSA (obstructive sleep apnea)     AHI 15 PSG 09/06/08  . Hx of colonoscopy   . Stroke   . Anxiety and depression   . PMR (polymyalgia rheumatica) 05/29/2012  . Hypogonadism male 05/29/2012  . Hyperparathyroidism 05/29/2012  . TIA (transient ischemic attack) 05/29/2012  . CKD (chronic kidney disease) stage 4, GFR 15-29 ml/min 05/29/2012  . COPD (chronic obstructive pulmonary disease) 05/29/2012  . Myocardial infarction   . Cancer     prostate ca history    Family History  family history includes Alcohol abuse (age of onset: 80) in his father; Coronary artery disease in an unspecified family member; and Stroke (age of onset: 37) in his mother.  Prior Rehab/Hospitalizations: [facility] d/c was 01/17/13 he was there for 18 days. He prefers CIR due to intensity he prefers and  [facility] does not have a bed which was closer to his home.   Current Medications  Current facility-administered medications:0.9 %  sodium chloride infusion, 250 mL, Intravenous, PRN, Tarry Kos, MD, Last Rate: 100 mL/hr at 04/17/13 1407, 250 mL at 04/17/13 1407;  acetaminophen (TYLENOL) tablet 650 mg, 650 mg, Oral, Q4H PRN, Roma Kayser Schorr, NP, 650 mg at 04/20/13 1708;  albuterol (PROVENTIL HFA;VENTOLIN HFA) 108 (90 BASE) MCG/ACT inhaler 2 puff, 2 puff, Inhalation, Q6H PRN, Tarry Kos, MD clopidogrel (PLAVIX) tablet 75 mg, 75 mg, Oral, Daily, Tarry Kos, MD, 75 mg at 04/21/13 1018;  diltiazem (CARDIZEM CD) 24 hr capsule 180 mg, 180 mg, Oral, Daily, Tarry Kos, MD, 180 mg at 04/21/13 1018;  enoxaparin (LOVENOX) injection 40 mg, 40 mg, Subcutaneous, Q24H, Tarry Kos, MD, 40 mg at 04/21/13 1018;  furosemide (LASIX) tablet 20 mg, 20 mg, Oral, Daily, Tarry Kos, MD, 20 mg at 04/21/13 1018 hydrALAZINE (APRESOLINE) tablet 50 mg, 50 mg, Oral, Q8H, [name], MD, 50 mg at 04/22/13 0659;  levofloxacin (LEVAQUIN) tablet 750 mg, 750 mg, Oral, Daily, [name], MD, 750 mg at 04/21/13 1715;  levothyroxine (SYNTHROID, LEVOTHROID) tablet 137 mcg, 137 mcg, Oral, Q breakfast, Tarry Kos, MD, 137 mcg at 04/22/13 1610;  sodium chloride 0.9 % injection 3 mL, 3 mL, Intravenous, Q12H, Tarry Kos, MD, 3 mL at 04/21/13 1018 sodium chloride 0.9 % injection 3 mL, 3 mL, Intravenous, PRN, Tarry Kos, MD;  traMADol Janean Sark) tablet 50 mg, 50 mg, Oral, Q6H PRN, Inez Catalina, MD, 50 mg at 04/22/13 9604  Patients Current Diet: Cardiac  Precautions / Restrictions Precautions Precautions: Fall Precaution Comments: Lightheaded with activity; Orthostatic Restrictions Weight Bearing Restrictions: No   Prior Activity Level Household: weekly outing; [name] does most errands  Journalist, newspaper / Equipment Home Assistive Devices/Equipment: Environmental consultant (specify type) Home Adaptive Equipment: Walker -  rolling  Prior Functional Level Prior Function Level of Independence: Independent with assistive device(s);Needs assistance Needs Assistance: Light Housekeeping;Meal Prep Able to Take Stairs?: Yes Driving: Yes Vocation: Retired  Current Functional Level Cognition  Arousal/Alertness: Awake/alert Overall Cognitive Status: Within Functional Limits for tasks assessed Orientation Level: Oriented X4;Oriented to person;Oriented to place;Oriented to time;Oriented to situation    Extremity Assessment (includes Sensation/Coordination)  RUE ROM/Strength/Tone: WFL for tasks assessed  RLE ROM/Strength/Tone: Deficits RLE ROM/Strength/Tone Deficits: Grossly decr strength, requiring physical assist for sit to stand    ADLs  Eating/Feeding: Performed;Set up Where Assessed - Eating/Feeding:  Edge of bed Grooming: Performed;Wash/dry hands;Wash/dry face;Teeth care;Min guard Where Assessed - Grooming: Supported standing Upper Body Bathing: Simulated;Minimal assistance Where Assessed - Upper Body Bathing: Unsupported sitting Lower Body Bathing: Simulated;Moderate assistance Where Assessed - Lower Body Bathing: Supported sit to stand Upper Body Dressing: Simulated;Minimal assistance Where Assessed - Upper Body Dressing: Unsupported sitting Lower Body Dressing: Performed;Moderate assistance Where Assessed - Lower Body Dressing: Supported sit to stand Toilet Transfer: Minimal assistance;Simulated Statistician Method: Sit to Barista: Bedside commode Transfers/Ambulation Related to ADLs: Patient performed bed mobility with S and increased time with use of rails. Sit to stand min guard A, ambulated to sink with RW min A.  ADL Comments: Patient fatigues very quickly with ADLs. Decreased activity tolerance with minimal activity.    Mobility  Bed Mobility: Supine to Sit;Sitting - Scoot to Edge of Bed Supine to Sit: 4: Min assist;With rails Sitting - Scoot to [place] of Bed: 4: Min  assist;With rail    Transfers  Transfers: Sit to Stand;Stand to Sit Sit to Stand: 3: Mod assist;From bed Stand to Sit: 3: Mod assist;With armrests;With upper extremity assist;To chair/3-in-1    Ambulation / Gait / Stairs / Wheelchair Mobility  Ambulation/Gait Ambulation/Gait Assistance: 4: Min Environmental consultant (Feet): 12 Feet Assistive device: Rolling walker Ambulation/Gait Assistance Details: Limited amb as pt became lightheaded with upright activity;  flexed posture; very slow cadence Gait Pattern: Decreased stride length    Posture / Balance      Special needs/care consideration Oxygen currently oxygen at 2 liters per [place] Bowel mgmt: continent Bladder mgmt: foley at present    Previous Home Environment Living Arrangements: Other (Comment);Spouse/significant other Lives With: Other (Comment);Spouse (Live in housekeeper ) Available Help at Discharge: Other (Comment) (housekeeper can provide supervision assist) Type of Home: House Home Layout: One level Home Access: Stairs to enter Entrance Stairs-Rails:  (to be determined) Entrance Stairs-Number of Steps: 3 Bathroom Shower/Tub: Associate Professor: Yes How Accessible: Accessible via walker Home Care Services: Yes  Discharge Living Setting Plans for Discharge Living Setting: Patient's home;Lives with (comment) (spouse and live in housekeeper) Type of Home at Discharge: House Discharge Home Layout: One level Discharge Home Access: Stairs to enter Entrance Stairs-Number of Steps: 3 Discharge Bathroom Shower/Tub: Tub/shower unit Discharge Bathroom Toilet: Standard Discharge Bathroom Accessibility: Yes How Accessible: Accessible via walker Do you have any problems obtaining your medications?: No  Social/Family/Support Systems Patient Roles: Spouse;Parent;Caregiver Contact Information: Araceli Bouche, son Anticipated Caregiver: housekeeper; Mitzi Davenport. 79 year  old Anticipated Caregiver's Contact Information: home number Ability/Limitations of Caregiver: supervision mainly Caregiver Availability: 24/7 Discharge Plan Discussed with Primary Caregiver: Yes Is Caregiver In Agreement with Plan?: Yes Does Caregiver/Family have Issues with Lodging/Transportation while Pt is in Rehab?: No    Goals/Additional Needs Patient/Family Goal for Rehab: supervision with PT and OT Expected length of stay: ELOS 7 to 10 days Special Service Needs: HOH and poor vision  Additional Information: He does not wear oxygen at home; history of multiple falls in past 6 months Pt/Family Agrees to Admission and willing to participate: Yes Program Orientation Provided & Reviewed with Pt/Caregiver Including Roles  & Responsibilities: Yes   Decrease burden of Care through IP rehab admission: n/a  Possible need for SNF placement upon discharge:pt does not want SNF nor does his son prefer SNF. Feel he will reach supervision level to return home   Patient Condition: This patient's condition remains as documented in the consult dated 04/22/13, in which  the Rehabilitation Physician determined and documented that the patient's condition is appropriate for intensive rehabilitative care in an inpatient rehabilitation facility pending pt's agreement to admit rather than to d/c directly home. These areas have been addressed. Patient's willingness to admit.  Will admit to inpatient rehab today.  Preadmission Screen Completed By:  Clois Dupes, 04/22/2013 9:29 AM ______________________________________________________________________   Discussed status with Dr. Riley Kill on 04/22/13 at  [phone] and received telephone approval for admission today.  Admission Coordinator:  Clois Dupes, time 1191 Date 04/22/13.

## 2013-04-22 NOTE — Progress Notes (Signed)
Physical Therapy Treatment Patient Details Name: [name] MRN: [id number] DOB: [birth year] Today's Date: 04/22/2013 Time: 1308-6578 PT Time Calculation (min): 24 min  PT Assessment / Plan / Recommendation Comments on Treatment Session  Session focused on ther-ex and OOB mobility to chair. Pt tolerated well but began to feel dizziness and fatigue as session concluded. Will continue to see and progress activity as tolerated.    Follow Up Recommendations  CIR           Equipment Recommendations  None recommended by PT    Recommendations for Other Services OT consult;Rehab consult  Frequency Min 3X/week   Plan Discharge plan remains appropriate    Precautions / Restrictions Precautions Precautions: Fall Precaution Comments: Lightheaded with activity; Orthostatic Restrictions Weight Bearing Restrictions: No   Pertinent Vitals/Pain No pain at this time, mild dizziness    Mobility  Bed Mobility Bed Mobility: Supine to Sit;Sitting - Scoot to Edge of Bed Supine to Sit: 4: Min assist;With rails Sitting - Scoot to [place] of Bed: 4: Min assist;With rail Details for Bed Mobility Assistance: Cues for initiation and technique; very slow moving, uncoordinated movement with getting up Transfers Transfers: Sit to Stand;Stand to Sit Sit to Stand: 3: Mod assist;From bed Stand to Sit: 3: Mod assist;With armrests;With upper extremity assist;To chair/3-in-1 Details for Transfer Assistance: Assist to elevate trunk and control movement Ambulation/Gait Ambulation/Gait Assistance: 4: Min assist Ambulation Distance (Feet): 14 Feet Assistive device: Rolling walker Ambulation/Gait Assistance Details: pt with dizziness during ambulation and fatigued from ther ex session. Gait Pattern: Decreased stride length    Exercises General Exercises - Lower Extremity Ankle Circles/Pumps: AROM;Both;10 reps Quad Sets: AROM;Both;10 reps (2 second holds) Long [employer]: AROM;Both;10 reps;Seated Heel Slides:  AROM;Both;10 reps;Supine Straight Leg Raises: AROM;Both;10 reps;Supine Heel Raises: AROM;Both;10 reps;Standing Mini-Sqauts: AROM;Both;10 reps;Standing     PT Goals Acute Rehab PT Goals PT Goal Formulation: With patient/family Time For Goal Achievement: 05/04/13 Potential to Achieve Goals: Good Pt will go Supine/Side to Sit: with supervision PT Goal: Supine/Side to Sit - Progress: Progressing toward goal Pt will go Sit to Supine/Side: with supervision PT Goal: Sit to Supine/Side - Progress: Progressing toward goal Pt will go Sit to Stand: with supervision PT Goal: Sit to Stand - Progress: Progressing toward goal Pt will go Stand to Sit: with supervision PT Goal: Stand to Sit - Progress: Progressing toward goal Pt will Transfer Bed to Chair/Chair to Bed: with supervision PT Transfer Goal: Bed to Chair/Chair to Bed - Progress: Progressing toward goal Pt will Ambulate: 51 - 150 feet;with supervision;with rolling walker PT Goal: Ambulate - Progress: Progressing toward goal Pt will Go Up / Down Stairs: 3-5 stairs;with min assist;with rail(s)  Visit Information  Last PT Received On: 04/22/13 Assistance Needed: +1    Subjective Data  Subjective: Pt very pleasent with no complaints this am Patient Stated Goal: wants to feel better   Cognition  Cognition Arousal/Alertness: Awake/alert Behavior During Therapy: WFL for tasks assessed/performed Overall Cognitive Status: Within Functional Limits for tasks assessed    Balance   Good standing balance despite fatigue  End of Session PT - End of Session Equipment Utilized During Treatment: Gait belt;Oxygen Activity Tolerance: Patient limited by fatigue Patient left: in chair;with call bell/phone within reach;with family/visitor present Nurse Communication: Mobility status;Other (comment) (need for supplemental O2)   GP     Fabio Asa 04/22/2013, 11:00 AM Charlotte Crumb, PT DPT  [phone]

## 2013-04-22 NOTE — H&P (Signed)
Physical Medicine and Rehabilitation Admission H&P  Chief Complaint   Patient presents with   .  Weakness   :  HPI: [name] is a 79 y.o. male with history of DM, COPD--non compliant with O2?, OSA, PMR, who was admitted on 04/16/13 past falls with one week history of chills and weakness. He was noted to be hypoxic and febrile at admission and was started on IV antibiotics for PNA. Patient with hypoxia with hyper carbia and worsening of right pleural effusion. He underwent right thoracocentesis of 1.1 Liter of clear liquid on 04/24 by IVR. PT evaluation done and patient noted to be deconditioned. PT, MD recommending CIR. After rehab consultation pt was ultimately admitted to CIR today.   Review of Systems  HENT: Positive for hearing loss.  Eyes: Positive for blurred vision (Poor vision right eye).  Respiratory: Positive for cough, sputum production, shortness of breath and wheezing.  Cardiovascular: Negative for chest pain and palpitations.  Gastrointestinal: Positive for abdominal pain (RUL mass causing discomfort. ).  Musculoskeletal: Positive for falls (multiple falls past 6 months due to BLE weakness.).  Psychiatric/Behavioral: Positive for memory loss (Report he gets dioriented. "Memory comes and goes." Poor recall of medical history. ).  Past Medical History   Diagnosis  Date   .  CAD (coronary artery disease)      s/p NSTEMI and BMS stent diagonal07/09; Lexiscan Myoview 9/13: EF 62%, no ischemia   .  AS (aortic stenosis)      a. Echo 2009 mean gradient 9mm HG. AVA 2.18; b. Echo 4/11 mild AS mean gradient 10mm HG; c. Echo 04/2011: Mild LVH, EF 55-60%, grade 1 diastolic dysfunction, mild aortic stenosis, mean gradient 14, mild LAE; d. Echo 9/13: mod LVH, EF 50-55%, mild to mod AS, mean gradient 17 mmHg   .  AF (paroxysmal atrial fibrillation)      Holter monitor 2.5 sec pauses   .  Fatigue      CPX 11/09 VO2 14.4 (75% predicte)d, slope 35. O2 pulse normal. VO2 corrected for body  weight 17.6.   Marland Kitchen  Hypothyroidism    .  Hyperlipidemia    .  History of prostate cancer    .  Allergic rhinitis    .  DM (diabetes mellitus)      type II diet controlled   .  Shingles    .  Left carotid stenosis    .  Vitamin B 12 deficiency    .  Anemia    .  Osteoarthritis    .  Nephrolithiasis    .  Obesity    .  OSA (obstructive sleep apnea)      AHI 15 PSG 09/06/08   .  Hx of colonoscopy    .  Stroke    .  Anxiety and depression    .  PMR (polymyalgia rheumatica)  05/29/2012   .  Hypogonadism male  05/29/2012   .  Hyperparathyroidism  05/29/2012   .  TIA (transient ischemic attack)  05/29/2012   .  CKD (chronic kidney disease) stage 4, GFR 15-29 ml/min  05/29/2012   .  COPD (chronic obstructive pulmonary disease)  05/29/2012   .  Myocardial infarction    .  Cancer      prostate ca history    Past Surgical History   Procedure  Laterality  Date   .  Appendectomy     .  Hernia repair     .  Right carpal tunnel  release     .  Lumbar spine surgery     .  Cystectomy       neck   .  Prostatectomy     .  Vasectomy     .  Total knee arthroplasty       right   .  Cholecystectomy      Family History   Problem  Relation  Age of Onset   .  Stroke  Mother  67   .  Coronary artery disease       father, brother, sister   .  Alcohol abuse  Father  23    Social History: Married. Independent PTA with walker. Wife with advanced dementia and has 24 hours assistance. He reports that he quit smoking about 54 years ago. His smoking use included Cigarettes. He has a 12.5 pack-year smoking history. He has quit using smokeless tobacco. He reports that he does not drink alcohol or use illicit drugs.  Allergies   Allergen  Reactions   .  Bactrim (Sulfamethoxazole W-Trimethoprim)  Other (See Comments)     hallucinations   .  Horse-Derived Products  Hives    Medications Prior to Admission   Medication  Sig  Dispense  Refill   .  albuterol (PROVENTIL HFA;VENTOLIN HFA) 108 (90 BASE) MCG/ACT inhaler   Inhale 2 puffs into the lungs every 6 (six) hours as needed for wheezing or shortness of breath.  1 Inhaler  5   .  Ascorbic Acid (VITAMIN C) 500 MG tablet  Take 500 mg by mouth daily.     .  cholecalciferol (VITAMIN D) 1000 UNITS tablet  Take 1,000 Units by mouth daily.     .  clopidogrel (PLAVIX) 75 MG tablet  Take 1 tablet (75 mg total) by mouth daily.  90 tablet  3   .  cyanocobalamin (,VITAMIN B-12,) 1000 MCG/ML injection  1 ml IM q 2 weeks  10 mL  5   .  cyclobenzaprine (FLEXERIL) 10 MG tablet  Take 5-10 mg by mouth 3 (three) times daily as needed. Muscle spasms.     Marland Kitchen  diltiazem (CARDIZEM CD) 180 MG 24 hr capsule  Take 1 capsule (180 mg total) by mouth daily.     Marland Kitchen  docusate sodium 100 MG CAPS  Take 100 mg by mouth 2 (two) times daily.  10 capsule    .  folic acid (FOLVITE) 1 MG tablet  Take 1 mg by mouth daily.     .  furosemide (LASIX) 20 MG tablet  Take 1 tablet (20 mg total) by mouth daily.  30 tablet  3   .  gabapentin (NEURONTIN) 300 MG capsule  TAKE ONE CAPSULE BY MOUTH AT BEDTIME  30 capsule  5   .  hydrOXYzine (ATARAX/VISTARIL) 10 MG tablet  Take 10 mg by mouth 3 (three) times daily as needed for anxiety.     .  hyoscyamine (LEVSIN, ANASPAZ) 0.125 MG tablet  Take 0.125 mg by mouth 4 (four) times daily as needed. Gas pain.     Marland Kitchen  levothyroxine (SYNTHROID, LEVOTHROID) 137 MCG tablet  Take 1 tablet (137 mcg total) by mouth daily.  90 tablet  3   .  methotrexate (RHEUMATREX) 2.5 MG tablet  Take 10 mg by mouth once a week. Takes 4 tablets weekly on Sunday. Caution:Chemotherapy. Protect from light.     .  pravastatin (PRAVACHOL) 40 MG tablet  TAKE ONE TABLET BY MOUTH EVERY DAY  30 tablet  5   .  sertraline (ZOLOFT) 50 MG tablet  Take 1 tablet (50 mg total) by mouth daily.  90 tablet  3   .  solifenacin (VESICARE) 5 MG tablet  Take 1 tablet (5 mg total) by mouth 2 (two) times daily.  60 tablet  11   .  traMADol (ULTRAM) 50 MG tablet  Take 1 tablet (50 mg total) by mouth every 6 (six)  hours as needed for pain.  120 tablet  2   .  traZODone (DESYREL) 100 MG tablet  Take 100 mg by mouth at bedtime.      Home:  Home Living  Lives With: Other (Comment);Spouse (Live in housekeeper )  Available Help at Discharge: Other (Comment) (housekeeper can provide supervision assist)  Type of Home: House  Home Access: Stairs to enter  [employer] of Steps: 3  Entrance Stairs-Rails: (to be determined)  Home Layout: One level  Bathroom Shower/Tub: Medical sales representative: Standard  Bathroom Accessibility: Yes  How Accessible: Accessible via walker  Home Adaptive Equipment: Walker - rolling  Functional History:  Prior Function  Able to Take Stairs?: Yes  Driving: Yes  Vocation: Retired  Functional Status:  Mobility:  Bed Mobility  Bed Mobility: Supine to Sit;Sitting - Scoot to Edge of Bed  Supine to Sit: 4: Min assist;With rails  Sitting - Scoot to [place] of Bed: 4: Min assist;With rail  Transfers  Transfers: Sit to Stand;Stand to Sit  Sit to Stand: 3: Mod assist;From bed  Stand to Sit: 3: Mod assist;With armrests;With upper extremity assist;To chair/3-in-1  Ambulation/Gait  Ambulation/Gait Assistance: 4: Min assist  Ambulation Distance (Feet): 12 Feet  Assistive device: Rolling walker  Ambulation/Gait Assistance Details: Limited amb as pt became lightheaded with upright activity; flexed posture; very slow cadence  Gait Pattern: Decreased stride length   ADL:  ADL  Eating/Feeding: Performed;Set up  Where Assessed - Eating/Feeding: Edge of bed  Grooming: Performed;Wash/dry hands;Wash/dry face;Teeth care;Min guard  Where Assessed - Grooming: Supported standing  Upper Body Bathing: Simulated;Minimal assistance  Where Assessed - Upper Body Bathing: Unsupported sitting  Lower Body Bathing: Simulated;Moderate assistance  Where Assessed - Lower Body Bathing: Supported sit to stand  Upper Body Dressing: Simulated;Minimal assistance  Where Assessed - Upper  Body Dressing: Unsupported sitting  Lower Body Dressing: Performed;Moderate assistance  Where Assessed - Lower Body Dressing: Supported sit to stand  Toilet Transfer: Minimal assistance;Simulated  Statistician Method: Sit to Production manager: Bedside commode  Transfers/Ambulation Related to ADLs: Patient performed bed mobility with S and increased time with use of rails. Sit to stand min guard A, ambulated to sink with RW min A.  ADL Comments: Patient fatigues very quickly with ADLs. Decreased activity tolerance with minimal activity.  Cognition:  Cognition  Overall Cognitive Status: Within Functional Limits for tasks assessed  Arousal/Alertness: Awake/alert  Orientation Level: Oriented X4;Oriented to person;Oriented to place;Oriented to time;Oriented to situation  Cognition  Arousal/Alertness: Awake/alert  Behavior During Therapy: WFL for tasks assessed/performed  Overall Cognitive Status: Within Functional Limits for tasks assessed   Physical Exam:  Blood pressure 115/58, pulse 68, temperature 97.9 F (36.6 C), temperature source Oral, resp. rate 19, height 5\' 8"  (1.727 m), weight 90 kg (198 lb 6.6 oz), SpO2 99.00%.  Physical Exam  Nursing note and vitals reviewed.  Constitutional: He is oriented to person, place, and time. He appears well-developed and well-nourished.  HENT:  Head: Normocephalic and atraumatic.  Eyes: Pupils are equal, round,  and reactive to light.  Neck: Normal range of motion.  Cardiovascular: Normal rate and regular rhythm. No murmur. 1+ bilateral LE edema. Pulmonary/Chest: Effort normal. He has wheezes. No distress Abdominal: Soft. Bowel sounds are normal.  Neurological: He is alert and oriented to person, place, and time.  Able to follow commands without difficulty. Poor memory. Anxious. UE grossly 4+/5. LE 3+ proximally to 4/5 distally. No gross sensory deficits. CN exam non-focal. Skin: Skin is warm and dry.   Results for orders placed  during the hospital encounter of 04/16/13 (from the past 48 hour(s))   CBC Status: Abnormal    Collection Time    04/21/13 5:18 AM   Result  Value  Range    WBC  5.5  4.0 - 10.5 K/uL    RBC  3.67 (*)  4.22 - 5.81 MIL/uL    Hemoglobin  11.9 (*)  13.0 - 17.0 g/dL    HCT  16.1 (*)  09.6 - 52.0 %    MCV  94.3  78.0 - 100.0 fL    MCH  32.4  26.0 - 34.0 pg    MCHC  34.4  30.0 - 36.0 g/dL    RDW  04.5  40.9 - 81.1 %    Platelets  247  150 - 400 K/uL   BASIC METABOLIC PANEL Status: Abnormal    Collection Time    04/21/13 5:18 AM   Result  Value  Range    Sodium  137  135 - 145 mEq/L    Potassium  4.1  3.5 - 5.1 mEq/L    Chloride  99  96 - 112 mEq/L    CO2  33 (*)  19 - 32 mEq/L    Glucose, Bld  100 (*)  70 - 99 mg/dL    BUN  12  6 - 23 mg/dL    Creatinine, Ser  9.14  0.50 - 1.35 mg/dL    Calcium  8.4  8.4 - 10.5 mg/dL    GFR calc non Af Amer  57 (*)  >90 mL/min    GFR calc Af Amer  66 (*)  >90 mL/min    Comment:      The eGFR has been calculated     using the CKD EPI equation.     This calculation has not been     validated in all clinical     situations.     eGFR's persistently     <90 mL/min signify     possible Chronic Kidney Disease.    Dg Chest 2 View  04/20/2013 *RADIOLOGY REPORT* Clinical Data: Right pleural effusion. Short of breath. CHEST - 2 VIEW Comparison: 04/18/2013. Findings: Right pneumothorax remains present, with the pleural line posterior to the right third rib. Right pleural effusion and collapse / consolidation of the right base appears similar to the prior exam. The pleural effusion is small. Cardiopericardial silhouette appears unchanged. Left basilar atelectasis. IMPRESSION: 1. Persistent right hydropneumothorax. Pleural line is posterior to the right posterior 3rd rib on today's exam, smaller than the prior exam 04/18/2013. 2. Persistent right basilar small effusion and collapse / consolidation. Original Report Authenticated By: Andreas Newport, M.D.   Post  Admission Physician Evaluation:  1. Functional deficits secondary to deconditioning from pneumonia, multiple medical. 2. Patient is admitted to receive collaborative, interdisciplinary care between the physiatrist, rehab nursing staff, and therapy team. 3. Patient's level of medical complexity and substantial therapy needs in context of that medical necessity cannot be provided at  a lesser intensity of care such as a SNF. 4. Patient has experienced substantial functional loss from his/her baseline which was documented above under the "Functional History" and "Functional Status" headings. Judging by the patient's diagnosis, physical exam, and functional history, the patient has potential for functional progress which will result in measurable gains while on inpatient rehab. These gains will be of substantial and practical use upon discharge in facilitating mobility and self-care at the household level. 5. Physiatrist will provide 24 hour management of medical needs as well as oversight of the therapy plan/treatment and provide guidance as appropriate regarding the interaction of the two. 6. 24 hour rehab nursing will assist with bladder management, bowel management, safety, skin/wound care, disease management, medication administration and patient education and help integrate therapy concepts, techniques,education, etc. 7. PT will assess and treat for/with: Lower extremity strength, range of motion, stamina, balance, functional mobility, safety, adaptive techniques and equipment, education. Goals are: mod I to supervision. 8. OT will assess and treat for/with: ADL's, functional mobility, safety, upper extremity strength, adaptive techniques and equipment, education. Goals are: mod I to supervision. 9. SLP will assess and treat for/with: n/a. Goals are: n/a. 10. Case Management and Social Worker will assess and treat for psychological issues and discharge planning. 11. Team conference will be held weekly to  assess progress toward goals and to determine barriers to discharge. 12. Patient will receive at least 3 hours of therapy per day at least 5 days per week. 13. ELOS: 7-10 days Prognosis: excellent Medical Problem List and Plan:  1. DVT Prophylaxis/Anticoagulation: Pharmaceutical: Lovenox  2. YN:WGNF continue ultram prn for pain management:  3. Mood: Has poor memory with lack on insight (Dementia?) Family indicating depressed mood. Likely adjustment reaction due medical issues this year as well as limitations. psychiatry follow up. LCSW will follow for support. Resume Zoloft.  4. Neuropsych: This patient is not capable of making decisions on his own behalf.  5. CAP with RLL effusion: changed over to po Levaquin yesterday. Antibiotic D#7/10  6. CKD: Improved with hydration. Will recheck in am  7. HTN: Will monitor with bid checks. Hydralizine added for better control on 04/27. Will need to monitor for tolerance and effects on renal status.  8. COPD/OSA?: Patient denies any oxygen needs.   Ranelle Oyster, MD, [place] [facility]  04/22/2013

## 2013-04-22 NOTE — Consult Note (Signed)
Reason for Consult: depression Referring Physician: Dr. Val Eagle and ti  [name] is an 79 y.o. male.  HPI: Patient was seen and chart reviewed. Patient has been depressed, feeling sad over two years over his wife has been diagnosed with Alzheimer dementia and he was unable to care for her. His second cousin moved in and helping both his wife and him. He is able participate in ADL's and has no significant memory problems. Patient stated that he has strong belief as a christian and never thought of suicide. He does actively participate in Sunday church activities.  He is a 79 yo male h/o chronic resp failure suppose to be on home oxygen but noncompliant, osa, rt sided pleural effusion for over 3 months, aortic stenosis, afib, ckd presented [facility] with fever, cough and not feeling well for 4 days. On arrival oxygen sats were 82% and report per ED provider was pt was awake, able to speak in full sentences and mentating normally. Pt was given iv rocephin and azithro. bp was stable. Febrile temp over 102. Pt on telemetry floor at [place] and arouses to voice but is somunlent. No sedatives were given at [facility] center that i can find. On VM 40% with sats around 90. abg not done. cxr shows worse rt sided pleural effusion. Pt denies any pain, is not tachypneic but is somulent with good color.    Mental Status Examination: Patient appeared as per his stated age,poorly groomed, and maintaining good eye contact. Patient has sad mood and his affect was constricted. He has normal rate, rhythm, and volume of speech. His thought process is linear and goal directed. Patient has denied suicidal, homicidal ideations, intentions or plans. Patient has no evidence of auditory or visual hallucinations, delusions, and paranoia. Patient has fair insight judgment and impulse control.  Past Medical History  Diagnosis Date  . CAD (coronary artery disease)     s/p NSTEMI and BMS stent diagonal07/09;  Lexiscan  Myoview 9/13:  EF 62%, no ischemia  . AS (aortic stenosis)     a.  Echo 2009 mean gradient 9mm HG. AVA 2.18;  b.  Echo 4/11 mild AS mean gradient 10mm HG;  c.  Echo 04/2011: Mild LVH, EF 55-60%, grade 1 diastolic dysfunction, mild aortic stenosis, mean gradient 14, mild LAE;  d. Echo 9/13: mod LVH, EF 50-55%, mild to mod AS, mean gradient 17 mmHg  . AF (paroxysmal atrial fibrillation)     Holter monitor 2.5 sec pauses  . Fatigue     CPX 11/09 VO2  14.4 (75% predicte)d, slope 35.  O2 pulse normal.  VO2 corrected for body weight 17.6.  Marland Kitchen Hypothyroidism   . Hyperlipidemia   . History of prostate cancer   . Allergic rhinitis   . DM (diabetes mellitus)     type II diet controlled  . Shingles   . Left carotid stenosis   . Vitamin B 12 deficiency   . Anemia   . Osteoarthritis   . Nephrolithiasis   . Obesity   . OSA (obstructive sleep apnea)     AHI 15 PSG 09/06/08  . Hx of colonoscopy   . Stroke   . Anxiety and depression   . PMR (polymyalgia rheumatica) 05/29/2012  . Hypogonadism male 05/29/2012  . Hyperparathyroidism 05/29/2012  . TIA (transient ischemic attack) 05/29/2012  . CKD (chronic kidney disease) stage 4, GFR 15-29 ml/min 05/29/2012  . COPD (chronic obstructive pulmonary disease) 05/29/2012  . Myocardial infarction   .  Cancer     prostate ca history    Past Surgical History  Procedure Laterality Date  . Appendectomy    . Hernia repair    . Right carpal tunnel release    . Lumbar spine surgery    . Cystectomy      neck  . Prostatectomy    . Vasectomy    . Total knee arthroplasty      right  . Cholecystectomy      Family History  Problem Relation Age of Onset  . Stroke Mother 3  . Coronary artery disease      father, brother, sister  . Alcohol abuse Father 19    Social History:  reports that he quit smoking about 54 years ago. His smoking use included Cigarettes. He has a 12.5 pack-year smoking history. He has quit using smokeless tobacco. He reports that he does not  drink alcohol or use illicit drugs.  Allergies:  Allergies  Allergen Reactions  . Bactrim (Sulfamethoxazole W-Trimethoprim) Other (See Comments)    hallucinations  . Horse-Derived Products Hives    Medications: I have reviewed the patient's current medications.  Results for orders placed during the hospital encounter of 04/16/13 (from the past 48 hour(s))  CBC     Status: Abnormal   Collection Time    04/21/13  5:18 AM      Result Value Range   WBC 5.5  4.0 - 10.5 K/uL   RBC 3.67 (*) 4.22 - 5.81 MIL/uL   Hemoglobin 11.9 (*) 13.0 - 17.0 g/dL   HCT 16.1 (*) 09.6 - 04.5 %   MCV 94.3  78.0 - 100.0 fL   MCH 32.4  26.0 - 34.0 pg   MCHC 34.4  30.0 - 36.0 g/dL   RDW 40.9  81.1 - 91.4 %   Platelets 247  150 - 400 K/uL  BASIC METABOLIC PANEL     Status: Abnormal   Collection Time    04/21/13  5:18 AM      Result Value Range   Sodium 137  135 - 145 mEq/L   Potassium 4.1  3.5 - 5.1 mEq/L   Chloride 99  96 - 112 mEq/L   CO2 33 (*) 19 - 32 mEq/L   Glucose, Bld 100 (*) 70 - 99 mg/dL   BUN 12  6 - 23 mg/dL   Creatinine, Ser 7.82  0.50 - 1.35 mg/dL   Calcium 8.4  8.4 - 95.6 mg/dL   GFR calc non Af Amer 57 (*) >90 mL/min   GFR calc Af Amer 66 (*) >90 mL/min   Comment:            The eGFR has been calculated     using the CKD EPI equation.     This calculation has not been     validated in all clinical     situations.     eGFR's persistently     <90 mL/min signify     possible Chronic Kidney Disease.    Dg Chest 2 View  04/20/2013  *RADIOLOGY REPORT*  Clinical Data: Right pleural effusion.  Short of breath.  CHEST - 2 VIEW  Comparison: 04/18/2013.  Findings: Right pneumothorax remains present, with the pleural line posterior to the right third rib.  Right pleural effusion and collapse / consolidation of the right base appears similar to the prior exam.  The pleural effusion is small.    Cardiopericardial silhouette appears unchanged.  Left basilar atelectasis.  IMPRESSION:  1.  Persistent right hydropneumothorax.  Pleural line is posterior to the right posterior 3rd rib on today's exam, smaller than the prior exam 04/18/2013. 2.  Persistent right basilar small effusion and collapse / consolidation.   Original Report Authenticated By: Andreas Newport, M.D.     Positive for anxiety, bad mood, depression and sleep disturbance Blood pressure 104/64, pulse 74, temperature 97.2 F (36.2 C), temperature source Oral, resp. rate 20, height 5\' 8"  (1.727 m), weight 194 lb 9 oz (88.253 kg), SpO2 98.00%.   Assessment/Plan: Maj. depressive disorder, recurrent  Recommendation: Agree with antidepressant medication Zoloft 50 mg daily for depression and monitor for GI side effects and trazodone 50 mg at bedtime for sleep. Patient does not meet criteria for acute psychiatric treatment. Will refer to an outpatient psychiatric services upon discharge from the hospital. Appreciate psych consult and will follow as needed while staying in hospital.  [facility] R. 04/22/2013, 4:59 PM

## 2013-04-23 ENCOUNTER — Encounter (HOSPITAL_COMMUNITY): Payer: Self-pay | Admitting: Neurology

## 2013-04-23 ENCOUNTER — Inpatient Hospital Stay (HOSPITAL_COMMUNITY): Payer: Medicare Other | Admitting: Occupational Therapy

## 2013-04-23 ENCOUNTER — Inpatient Hospital Stay (HOSPITAL_COMMUNITY): Payer: Medicare Other | Admitting: Physical Therapy

## 2013-04-23 DIAGNOSIS — R5381 Other malaise: Secondary | ICD-10-CM

## 2013-04-23 DIAGNOSIS — J159 Unspecified bacterial pneumonia: Secondary | ICD-10-CM

## 2013-04-23 LAB — CBC WITH DIFFERENTIAL/PLATELET
Hemoglobin: 12.1 g/dL — ABNORMAL LOW (ref 13.0–17.0)
Lymphs Abs: 1.7 10*3/uL (ref 0.7–4.0)
MCH: 32.5 pg (ref 26.0–34.0)
Monocytes Relative: 15 % — ABNORMAL HIGH (ref 3–12)
Neutro Abs: 2.8 10*3/uL (ref 1.7–7.7)
Neutrophils Relative %: 52 % (ref 43–77)
RBC: 3.72 MIL/uL — ABNORMAL LOW (ref 4.22–5.81)

## 2013-04-23 LAB — COMPREHENSIVE METABOLIC PANEL
Alkaline Phosphatase: 56 U/L (ref 39–117)
BUN: 16 mg/dL (ref 6–23)
CO2: 30 mEq/L (ref 19–32)
Chloride: 97 mEq/L (ref 96–112)
GFR calc Af Amer: 70 mL/min — ABNORMAL LOW (ref >90)
Glucose, Bld: 104 mg/dL — ABNORMAL HIGH (ref 70–99)
Potassium: 3.7 mEq/L (ref 3.5–5.1)
Total Bilirubin: 0.2 mg/dL — ABNORMAL LOW (ref 0.3–1.2)

## 2013-04-23 LAB — CULTURE, BLOOD (ROUTINE X 2)
Culture: NO GROWTH
Culture: NO GROWTH

## 2013-04-23 MED ORDER — METHOTREXATE 2.5 MG PO TABS
10.0000 mg | ORAL_TABLET | ORAL | Status: DC
  Administered 2013-04-27: 10 mg via ORAL
  Filled 2013-04-23: qty 4

## 2013-04-23 MED ORDER — DICLOFENAC SODIUM 1 % TD GEL
2.0000 g | Freq: Four times a day (QID) | TRANSDERMAL | Status: DC
  Administered 2013-04-23 – 2013-04-30 (×23): 2 g via TOPICAL
  Filled 2013-04-23 (×2): qty 100

## 2013-04-23 NOTE — Progress Notes (Signed)
Patient with dizziness, weakness and malaise this am. Orthostatic drop noted with SBP in 80s. hydrazine added recently--Check orthostatic vitals, will discontinue this  and set parameters on other BP meds.

## 2013-04-23 NOTE — Evaluation (Signed)
Occupational Therapy Assessment and Plan  Patient Details  Name: [name] MRN: [id number] Date of Birth: 03-16-1923  OT Diagnosis: muscle weakness (generalized) Rehab Potential: Rehab Potential: Good ELOS: 10 days   Today's Date: 04/23/2013 Time: 1030-1130 Time Calculation (min): 60 min  Problem List:  Patient Active Problem List   Diagnosis Date Noted  . Physical deconditioning 04/22/2013  . PNA (pneumonia) 04/17/2013  . Pleural effusion 04/17/2013  . Syncope 12/24/2012  . Headache 12/05/2012  . General weakness 12/05/2012  . Fall 12/05/2012  . Ulcer 11/20/2012  . Acute kidney failure 11/20/2012  . Hemoptysis 11/19/2012  . Aphthous ulcer 11/19/2012  . Pneumothorax, right 11/19/2012  . PAF (paroxysmal atrial fibrillation) 11/19/2012  . History of CVA (cerebrovascular accident) 11/19/2012  . PMR (polymyalgia rheumatica) 05/29/2012  . History of MI (myocardial infarction) 05/29/2012  . Hypogonadism male 05/29/2012  . Anxiety 05/29/2012  . Hyperparathyroidism 05/29/2012  . TIA (transient ischemic attack) 05/29/2012  . CKD (chronic kidney disease) stage 4, GFR 15-29 ml/min 05/29/2012  . COPD (chronic obstructive pulmonary disease) 05/29/2012  . OSA (obstructive sleep apnea) 05/29/2012  . Gait disorder 05/29/2012  . Chronic steroid use 05/29/2012  . Depression   . Preventative health care 05/18/2012  . AS (aortic stenosis)   . AF (paroxysmal atrial fibrillation)   . DM (diabetes mellitus)   . Shingles   . Left carotid stenosis   . Osteoarthritis   . Nephrolithiasis   . Obesity   . Hx of colonoscopy   . HYPERTENSION, BENIGN 04/07/2010  . AORTIC STENOSIS/ INSUFFICIENCY, NON-RHEUMATIC 04/05/2009  . LEG CRAMPS, NOCTURNAL 10/07/2008  . HYPERKALEMIA 10/01/2008  . RENAL FAILURE 10/01/2008  . DERMATITIS, FACE 10/01/2008  . SLEEP APNEA, OBSTRUCTIVE 09/28/2008  . CONTACT DERMATITIS&OTHER ECZEMA DUE TO SUNBURN 07/28/2008  . DEPRESSIVE DISORDER NOT ELSEWHERE CLASSIFIED  07/24/2008  . HYPOKALEMIA 07/21/2008  . RECTAL BLEEDING 07/21/2008  . Edema 07/08/2008  . Atrial flutter 06/25/2008  . Pernicious anemia 06/23/2008  . FATIGUE 06/23/2008  . PROSTATE CANCER, HX OF 06/20/2008  . VITAMIN B12 DEFICIENCY 06/17/2008  . BACK PAIN 06/15/2008  . LIPOMA 04/30/2008  . HYPOTHYROIDISM 04/21/2008  . HYPERLIPIDEMIA 04/21/2008  . ANEMIA 04/21/2008  . CORONARY ARTERY DISEASE 04/21/2008  . ALLERGIC RHINITIS 04/21/2008  . BURSITIS 04/21/2008  . PLANTAR FASCIITIS 04/21/2008  . UNS ADVRS EFF OTH RX MEDICINAL&BIOLOGICAL SBSTNC 04/21/2008    Past Medical History:  Past Medical History  Diagnosis Date  . CAD (coronary artery disease)     s/p NSTEMI and BMS stent diagonal07/09;  Lexiscan Myoview 9/13:  EF 62%, no ischemia  . AS (aortic stenosis)     a.  Echo 2009 mean gradient 9mm HG. AVA 2.18;  b.  Echo 4/11 mild AS mean gradient 10mm HG;  c.  Echo 04/2011: Mild LVH, EF 55-60%, grade 1 diastolic dysfunction, mild aortic stenosis, mean gradient 14, mild LAE;  d. Echo 9/13: mod LVH, EF 50-55%, mild to mod AS, mean gradient 17 mmHg  . AF (paroxysmal atrial fibrillation)     Holter monitor 2.5 sec pauses  . Fatigue     CPX 11/09 VO2  14.4 (75% predicte)d, slope 35.  O2 pulse normal.  VO2 corrected for body weight 17.6.  Marland Kitchen Hypothyroidism   . Hyperlipidemia   . History of prostate cancer   . Allergic rhinitis   . DM (diabetes mellitus)     type II diet controlled  . Shingles   . Left carotid stenosis   . Vitamin B 12  deficiency   . Anemia   . Osteoarthritis   . Nephrolithiasis   . Obesity   . OSA (obstructive sleep apnea)     AHI 15 PSG 09/06/08  . Hx of colonoscopy   . Stroke   . Anxiety and depression   . PMR (polymyalgia rheumatica) 05/29/2012  . Hypogonadism male 05/29/2012  . Hyperparathyroidism 05/29/2012  . TIA (transient ischemic attack) 05/29/2012  . CKD (chronic kidney disease) stage 4, GFR 15-29 ml/min 05/29/2012  . COPD (chronic obstructive pulmonary  disease) 05/29/2012  . Myocardial infarction   . Cancer     prostate ca history   Past Surgical History:  Past Surgical History  Procedure Laterality Date  . Appendectomy    . Hernia repair    . Right carpal tunnel release    . Lumbar spine surgery    . Cystectomy      neck  . Prostatectomy    . Vasectomy    . Total knee arthroplasty      right  . Cholecystectomy      Assessment & Plan Clinical Impression: Patient is a 79 y.o. year old male with history of DM, COPD--non compliant with O2?, OSA, PMR, who was admitted on 04/16/13 past falls with one week history of chills and weakness. He was noted to be hypoxic and febrile at admission and was started on IV antibiotics for PNA. Patient with hypoxia with hyper carbia and worsening of right pleural effusion. He underwent right thoracocentesis of 1.1 Liter of clear liquid on 04/24 by IVR.   Patient transferred to CIR on 04/22/2013 .    Patient currently requires mod with basic self-care skills and basic mobility secondary to muscle weakness and orthostatic BP, deconditioning , decreased cardiorespiratoy endurance and decreased oxygen support, poor activity tolerance and decreased standing balance and decreased balance strategies.  Prior to hospitalization, patient could complete ADL with supervision to mod I with muliple falls.  Patient will benefit from skilled intervention to decrease level of assist with basic self-care skills and increase independence with basic self-care skills prior to discharge home with care partner.  Anticipate patient will require intermittent supervision and follow up home health.  OT - End of Session Activity Tolerance: Tolerates < 10 min activity with changes in vital signs Endurance Deficit: Yes OT Assessment Rehab Potential: Good OT Plan OT Intensity: Minimum of 1-2 x/day, 45 to 90 minutes OT Frequency: 5 out of 7 days OT Duration/Estimated Length of Stay: 10 days OT Treatment/Interventions:  Balance/vestibular training;Community reintegration;Disease Conservator, museum/gallery;Discharge planning;Functional mobility training;Pain management;Psychosocial support;Patient/family education;Self Care/advanced ADL retraining;Therapeutic Activities;UE/LE Strength taining/ROM;Therapeutic Exercise;UE/LE Coordination activities OT Recommendation Patient destination: Home Follow Up Recommendations: Home health OT   Skilled Therapeutic Intervention   OT Evaluation Precautions/Restrictions  Precautions Precautions: Fall Precaution Comments: Lightheaded with activity General Chart Reviewed: Yes Family/Caregiver Present: No Vital Signs Therapy Vitals BP: 88/53 mmHg Oxygen Therapy SpO2: 96 % O2 Device: None (Room air) Pain Pain Assessment Pain Assessment: No/denies pain Home Living/Prior Functioning Home Living Lives With: Other (Comment);Spouse (live in housekeeper) Available Help at Discharge: Other (Comment) (housekeeper can provide S) Type of Home: House Home Access: Ramped entrance (in the back of the house) Home Layout: One level Bathroom Shower/Tub: Engineer, manufacturing systems: Standard ADL   Vision/Perception  Vision - History Baseline Vision: Wears glasses only for reading Patient Visual Report: No change from baseline Vision - Assessment Eye Alignment: Within Functional Limits Perception Perception: Within Functional Limits Praxis Praxis: Intact  Cognition Overall Cognitive Status:  Within Functional Limits for tasks assessed Arousal/Alertness: Awake/alert Orientation Level: Oriented X4 Safety/Judgment: Appears intact Sensation Sensation Light Touch: Appears Intact Hot/Cold: Appears Intact Proprioception: Appears Intact Coordination Gross Motor Movements are Fluid and Coordinated: Yes (a little shakey) Fine Motor Movements are Fluid and Coordinated: Yes Motor  Motor Motor - Skilled Clinical Observations: generalized  weakness Mobility  Bed Mobility Bed Mobility: Sit to Supine Supine to Sit: 4: Min assist Sitting - Scoot to Edge of Bed: 4: Min guard Sit to Supine: 4: Min assist Transfers Sit to Stand: 4: Min assist Stand to Sit: 4: Min assist  Trunk/Postural Assessment  Cervical Assessment Cervical Assessment: Within Functional Limits Thoracic Assessment Thoracic Assessment: Within Functional Limits Lumbar Assessment Lumbar Assessment: Within Functional Limits Postural Control Postural Control: Deficits on evaluation Righting Reactions: delayed  Balance Balance Balance Assessed: Yes Static Standing Balance Static Standing - Level of Assistance: 5: Stand by assistance Dynamic Standing Balance Dynamic Standing - Level of Assistance: 4: Min assist;3: Mod assist Extremity/Trunk Assessment RUE Assessment RUE Assessment:  (3+/5) LUE Assessment LUE Assessment: Within Functional Limits (3+/5)  FIM:  FIM - Grooming Grooming Steps: Wash, rinse, dry face;Wash, rinse, dry hands;Oral care, brush teeth, clean dentures;Brush, comb hair;Shave or apply make-up Grooming: 3: Patient completes 2 of 4 or 3 of 5 steps FIM - Bathing Bathing Steps Patient Completed: Chest;Right Arm;Left Arm;Abdomen;Right upper leg;Left upper leg Bathing: 3: Mod-Patient completes 5-7 76f 10 parts or 50-74% FIM - Upper Body Dressing/Undressing Upper body dressing/undressing steps patient completed: Thread/unthread right sleeve of pullover shirt/dresss;Thread/unthread left sleeve of pullover shirt/dress;Put head through opening of pull over shirt/dress Upper body dressing/undressing: 4: Min-Patient completed 75 plus % of tasks FIM - Lower Body Dressing/Undressing Lower body dressing/undressing: 1: Total-Patient completed less than 25% of tasks FIM - Bed/Chair Transfer Bed/Chair Transfer: 4: Chair or W/C > Bed: Min A (steadying Pt. > 75%);4: Bed > Chair or W/C: Min A (steadying Pt. > 75%)   Refer to Care Plan for Long Term  Goals  Recommendations for other services: None  Discharge Criteria: Patient will be discharged from OT if patient refuses treatment 3 consecutive times without medical reason, if treatment goals not met, if there is a change in medical status, if patient makes no progress towards goals or if patient is discharged from hospital.  The above assessment, treatment plan, treatment alternatives and goals were discussed and mutually agreed upon: by patient  1:1 OT eval initiated with OT goals, purpose and role discussed with pt. Pt with c/o lightheadedness and "just not feeling well." RN made aware. Self care retraining at sink level with focus on sit to stand, activity tolerance, standing balance and tolerance. Pt required A with all LB tasks due to fatigue and poor activity tolerance. Pt required O2 during ADL to maintain sats above 88% and required frequent rest breaks. Applied TED hose per RN request and took orthostatic BP for c/o lightheadedness with standing  BP   Supine 106/51  HR75  Sitting  90/51  HR 79  Standing 72/41  HR 85  Roney Mans Reddick 04/23/2013, 11:46 AM

## 2013-04-23 NOTE — Progress Notes (Signed)
Patient information reviewed and entered into eRehab system by Iyauna Sing, RN, CRRN, PPS Coordinator.  Information including medical coding and functional independence measure will be reviewed and updated through discharge.     Per nursing patient was given "Data Collection Information Summary for Patients in Inpatient Rehabilitation Facilities with attached "Privacy Act Statement-Health Care Records" upon admission.  

## 2013-04-23 NOTE — Progress Notes (Signed)
Physical Therapy Session Note  Patient Details  Name: [name] MRN: [id number] Date of Birth: 1923/10/08  Today's Date: 04/23/2013 Time: 2952-8413 Time Calculation (min): 47 min  Skilled Therapeutic Interventions/Progress Updates:   This session focused on Gait training and endurance training.  Pt ambulated with RW 75'x2 with min assist.  DOE 2/4 with O2 sats 96-98% on RA during entire session.  Stairs with Bil rails min assist reciprocal pattern 4-6" step height and 5 steps x 2.  Pt walking short distances in room to and from bathroom with RW min assist. Pt needed multiple seated rest breaks to recover breathing.  Nu Step x 8 mins level 5 bil upper and LEs for endurance and strength training.  Standing endurance using b-ball goal and horse shoes working on standing tolerance and dynamic balance x 5 min.  Pt became mildly nauseated, and then burped and was able to continue during tx session.  "I've had a lot of gas".    Therapy Documentation Precautions:  Precautions Precautions: Fall Precaution Comments: monitor O2 sats on RA Restrictions Weight Bearing Restrictions: No   Locomotion : Ambulation Ambulation/Gait Assistance: 4: Min assist   See FIM for current functional status  Therapy/Group: Individual Therapy  Lurena Joiner B. Sarp Vernier, PT, DPT [phone]   04/23/2013, 4:15 PM

## 2013-04-23 NOTE — Evaluation (Signed)
Physical Therapy Assessment and Plan  Patient Details  Name: [name] MRN: [id number] Date of Birth: November 17, 1923  PT Diagnosis: Difficulty walking and Muscle weakness Rehab Potential: Good ELOS: 7-10 days   Today's Date: 04/23/2013 Time: 119-147 Time Calculation (min): 60 min  Problem List:  Patient Active Problem List   Diagnosis Date Noted  . Physical deconditioning 04/22/2013  . PNA (pneumonia) 04/17/2013  . Pleural effusion 04/17/2013  . Syncope 12/24/2012  . Headache 12/05/2012  . General weakness 12/05/2012  . Fall 12/05/2012  . Ulcer 11/20/2012  . Acute kidney failure 11/20/2012  . Hemoptysis 11/19/2012  . Aphthous ulcer 11/19/2012  . Pneumothorax, right 11/19/2012  . PAF (paroxysmal atrial fibrillation) 11/19/2012  . History of CVA (cerebrovascular accident) 11/19/2012  . PMR (polymyalgia rheumatica) 05/29/2012  . History of MI (myocardial infarction) 05/29/2012  . Hypogonadism male 05/29/2012  . Anxiety 05/29/2012  . Hyperparathyroidism 05/29/2012  . TIA (transient ischemic attack) 05/29/2012  . CKD (chronic kidney disease) stage 4, GFR 15-29 ml/min 05/29/2012  . COPD (chronic obstructive pulmonary disease) 05/29/2012  . OSA (obstructive sleep apnea) 05/29/2012  . Gait disorder 05/29/2012  . Chronic steroid use 05/29/2012  . Depression   . Preventative health care 05/18/2012  . AS (aortic stenosis)   . AF (paroxysmal atrial fibrillation)   . DM (diabetes mellitus)   . Shingles   . Left carotid stenosis   . Osteoarthritis   . Nephrolithiasis   . Obesity   . Hx of colonoscopy   . HYPERTENSION, BENIGN 04/07/2010  . AORTIC STENOSIS/ INSUFFICIENCY, NON-RHEUMATIC 04/05/2009  . LEG CRAMPS, NOCTURNAL 10/07/2008  . HYPERKALEMIA 10/01/2008  . RENAL FAILURE 10/01/2008  . DERMATITIS, FACE 10/01/2008  . SLEEP APNEA, OBSTRUCTIVE 09/28/2008  . CONTACT DERMATITIS&OTHER ECZEMA DUE TO SUNBURN 07/28/2008  . DEPRESSIVE DISORDER NOT ELSEWHERE CLASSIFIED 07/24/2008   . HYPOKALEMIA 07/21/2008  . RECTAL BLEEDING 07/21/2008  . Edema 07/08/2008  . Atrial flutter 06/25/2008  . Pernicious anemia 06/23/2008  . FATIGUE 06/23/2008  . PROSTATE CANCER, HX OF 06/20/2008  . VITAMIN B12 DEFICIENCY 06/17/2008  . BACK PAIN 06/15/2008  . LIPOMA 04/30/2008  . HYPOTHYROIDISM 04/21/2008  . HYPERLIPIDEMIA 04/21/2008  . ANEMIA 04/21/2008  . CORONARY ARTERY DISEASE 04/21/2008  . ALLERGIC RHINITIS 04/21/2008  . BURSITIS 04/21/2008  . PLANTAR FASCIITIS 04/21/2008  . UNS ADVRS EFF OTH RX MEDICINAL&BIOLOGICAL SBSTNC 04/21/2008    Past Medical History:  Past Medical History  Diagnosis Date  . CAD (coronary artery disease)     s/p NSTEMI and BMS stent diagonal07/09;  Lexiscan Myoview 9/13:  EF 62%, no ischemia  . AS (aortic stenosis)     a.  Echo 2009 mean gradient 9mm HG. AVA 2.18;  b.  Echo 4/11 mild AS mean gradient 10mm HG;  c.  Echo 04/2011: Mild LVH, EF 55-60%, grade 1 diastolic dysfunction, mild aortic stenosis, mean gradient 14, mild LAE;  d. Echo 9/13: mod LVH, EF 50-55%, mild to mod AS, mean gradient 17 mmHg  . AF (paroxysmal atrial fibrillation)     Holter monitor 2.5 sec pauses  . Fatigue     CPX 11/09 VO2  14.4 (75% predicte)d, slope 35.  O2 pulse normal.  VO2 corrected for body weight 17.6.  Marland Kitchen Hypothyroidism   . Hyperlipidemia   . History of prostate cancer   . Allergic rhinitis   . DM (diabetes mellitus)     type II diet controlled  . Shingles   . Left carotid stenosis   . Vitamin B 12  deficiency   . Anemia   . Osteoarthritis   . Nephrolithiasis   . Obesity   . OSA (obstructive sleep apnea)     AHI 15 PSG 09/06/08  . Hx of colonoscopy   . Stroke   . Anxiety and depression   . PMR (polymyalgia rheumatica) 05/29/2012  . Hypogonadism male 05/29/2012  . Hyperparathyroidism 05/29/2012  . TIA (transient ischemic attack) 05/29/2012  . CKD (chronic kidney disease) stage 4, GFR 15-29 ml/min 05/29/2012  . COPD (chronic obstructive pulmonary disease)  05/29/2012  . Myocardial infarction   . Cancer     prostate ca history   Past Surgical History:  Past Surgical History  Procedure Laterality Date  . Appendectomy    . Hernia repair    . Right carpal tunnel release    . Lumbar spine surgery    . Cystectomy      neck  . Prostatectomy    . Vasectomy    . Total knee arthroplasty      right  . Cholecystectomy      Assessment & Plan Clinical Impression: Patient is a 79 y.o. year old male with recent admission to the hospital on 04/16/13 past falls with one week history of chills and weakness. He was noted to be hypoxic and febrile at admission and was started on IV antibiotics for PNA. Patient with hypoxia with hyper carbia and worsening of right pleural effusion. He underwent right thoracocentesis of 1.1 Liter of clear liquid on 04/24 by IVR.  Patient transferred to CIR on 04/22/2013 .   Patient currently requires min with mobility secondary to muscle weakness and decreased oxygen support.  Prior to hospitalization, patient was modified independent  with mobility and lived with Spouse;Other (Comment) in a House home.  Home access is  Ramped entrance.  Patient will benefit from skilled PT intervention to maximize safe functional mobility, minimize fall risk and decrease caregiver burden for planned discharge home with intermittent assist.  Anticipate patient will benefit from follow up [facility] at discharge.  PT - End of Session Activity Tolerance: Tolerates 30+ min activity with multiple rests Endurance Deficit: Yes PT Assessment Rehab Potential: Good PT Plan PT Intensity: Minimum of 1-2 x/day ,45 to 90 minutes PT Frequency: 5 out of 7 days PT Duration Estimated Length of Stay: 7-10 days PT Treatment/Interventions: Ambulation/gait training;Discharge planning;Functional mobility training;Therapeutic Activities;Wheelchair propulsion/positioning;Neuromuscular re-education;Therapeutic Exercise;Balance/vestibular training;DME/adaptive equipment  instruction;Pain management;UE/LE Strength taining/ROM;Splinting/orthotics;Stair training;UE/LE Coordination activities;Patient/family education;Community reintegration PT Recommendation Follow Up Recommendations: Home health PT Patient destination: Home  Skilled Therapeutic Intervention Pt limited throughout eval by episodes of dry heaving, RN made aware.  Gait in controlled environment 2 x 45' with min A with RW.  Standing tolerance with RW up to 1 minute before fatigued.  Bathroom transfers and mobility with RW with min A.  Pt required frequent rest breaks.  spO2 90-94% throughout treatment with activity on room air.  PT Evaluation Precautions/Restrictions Precautions Precautions: Fall Precaution Comments: Lightheaded with activity Restrictions Weight Bearing Restrictions: No Pain Pain Assessment Pain Assessment: No/denies pain Home Living/Prior Functioning Home Living Lives With: Spouse;Other (Comment) Available Help at Discharge: Personal care attendant Type of Home: House Home Access: Ramped entrance Home Layout: One level Bathroom Shower/Tub: Engineer, manufacturing systems: Standard Home Adaptive Equipment: Walker - rolling Prior Function Able to Take Stairs?: Yes Driving: Yes  Cognition Overall Cognitive Status: Within Functional Limits for tasks assessed Arousal/Alertness: Awake/alert Orientation Level: Oriented X4 Safety/Judgment: Appears intact Sensation Sensation Light Touch: Appears Intact Hot/Cold: Appears Intact  Proprioception: Appears Intact Coordination Gross Motor Movements are Fluid and Coordinated: Yes Fine Motor Movements are Fluid and Coordinated: Yes Motor  Motor Motor - Skilled Clinical Observations: generalized weakness  Mobility Bed Mobility Bed Mobility: Sit to Supine Supine to Sit: 4: Min assist Sitting - Scoot to Edge of Bed: 4: Min guard Sit to Supine: 4: Min assist Transfers Sit to Stand: 4: Min assist Stand to Sit: 4: Min  Multimedia programmer Transfers: 4: Min Actuary Details (indicate cue type and reason): lifting assist Locomotion  Ambulation Ambulation: Yes Ambulation/Gait Assistance: 4: Min assist Ambulation Distance (Feet): 45 Feet Assistive device: Rolling walker Ambulation/Gait Assistance Details: forward flexed trunk, cues for deep breathing, spO2 94% after ambulation  Trunk/Postural Assessment  Cervical Assessment Cervical Assessment: Within Functional Limits Thoracic Assessment Thoracic Assessment: Within Functional Limits Lumbar Assessment Lumbar Assessment:  (forward flexed trunk in standing) Postural Control Postural Control: Deficits on evaluation Righting Reactions: delayed  Balance Balance Balance Assessed: Yes Static Standing Balance Static Standing - Level of Assistance: 5: Stand by assistance Dynamic Standing Balance Dynamic Standing - Balance Support: During functional activity Dynamic Standing - Level of Assistance: 4: Min assist Extremity Assessment  RUE Assessment RUE Assessment:  (3+/5) LUE Assessment LUE Assessment: Within Functional Limits (3+/5) RLE Assessment RLE Assessment: Within Functional Limits LLE Assessment LLE Assessment: Within Functional Limits  FIM:  FIM - Bed/Chair Transfer Bed/Chair Transfer: 4: Chair or W/C > Bed: Min A (steadying Pt. > 75%);4: Bed > Chair or W/C: Min A (steadying Pt. > 75%) FIM - Locomotion: Ambulation Ambulation/Gait Assistance: 4: Min assist Locomotion: Ambulation: 1: Travels less than 50 ft with minimal assistance (Pt.>75%)   Refer to Care Plan for Long Term Goals  Recommendations for other services: None  Discharge Criteria: Patient will be discharged from PT if patient refuses treatment 3 consecutive times without medical reason, if treatment goals not met, if there is a change in medical status, if patient makes no progress towards goals or if patient is discharged from hospital.  The above  assessment, treatment plan, treatment alternatives and goals were discussed and mutually agreed upon: by patient  [facility] 04/23/2013, 12:14 PM

## 2013-04-23 NOTE — Progress Notes (Signed)
Physical Therapy Note  Patient Details  Name: [name] MRN: [id number] Date of Birth: Sep 23, 1923 Today's Date: 04/23/2013  Time: 1430-1500 30 minutes  No c/o pain.  Short distance gait training with supervision with RW in controlled and home environments.  Car transfer training to simulated sedan height with supervision, truck height with min A.  Pt states he has both a truck and sedan at home.  Educated pt on safety with sedan transfers at this point, he expresses understanding.  Individual therapy   DONAWERTH,KAREN 04/23/2013, 4:10 PM

## 2013-04-23 NOTE — Progress Notes (Signed)
Patient ID: [name], male   DOB: 19-Nov-1923, 79 y.o.   MRN: [id number] Subjective/Complaints:  [name] is a 79 y.o. male with history of DM, COPD--non compliant with O2?, OSA, PMR, who was admitted on 04/16/13 past falls with one week history of chills and weakness. He was noted to be hypoxic and febrile at admission and was started on IV antibiotics for PNA. Patient with hypoxia with hyper carbia and worsening of right pleural effusion. He underwent right thoracocentesis of 1.1 Liter of clear liquid on 04/24 by IVR Review of Systems  Musculoskeletal: Positive for joint pain.       B knee pain  Psychiatric/Behavioral: The patient has insomnia.    Objective: Vital Signs: Blood pressure 130/73, pulse 79, temperature 97.6 F (36.4 C), temperature source Oral, resp. rate 19, height 5\' 8"  (1.727 m), weight 88.253 kg (194 lb 9 oz), SpO2 93.00%. No results found. Results for orders placed during the hospital encounter of 04/22/13 (from the past 72 hour(s))  CBC WITH DIFFERENTIAL     Status: Abnormal   Collection Time    04/23/13  5:18 AM      Result Value Range   WBC 5.4  4.0 - 10.5 K/uL   RBC 3.72 (*) 4.22 - 5.81 MIL/uL   Hemoglobin 12.1 (*) 13.0 - 17.0 g/dL   HCT 40.9 (*) 81.1 - 91.4 %   MCV 95.2  78.0 - 100.0 fL   MCH 32.5  26.0 - 34.0 pg   MCHC 34.2  30.0 - 36.0 g/dL   RDW 78.2  95.6 - 21.3 %   Platelets 280  150 - 400 K/uL   Neutrophils Relative 52  43 - 77 %   Neutro Abs 2.8  1.7 - 7.7 K/uL   Lymphocytes Relative 31  12 - 46 %   Lymphs Abs 1.7  0.7 - 4.0 K/uL   Monocytes Relative 15 (*) 3 - 12 %   Monocytes Absolute 0.8  0.1 - 1.0 K/uL   Eosinophils Relative 3  0 - 5 %   Eosinophils Absolute 0.2  0.0 - 0.7 K/uL   Basophils Relative 0  0 - 1 %   Basophils Absolute 0.0  0.0 - 0.1 K/uL  COMPREHENSIVE METABOLIC PANEL     Status: Abnormal   Collection Time    04/23/13  5:18 AM      Result Value Range   Sodium 134 (*) 135 - 145 mEq/L   Potassium 3.7  3.5 - 5.1 mEq/L   Chloride 97  96 - 112 mEq/L   CO2 30  19 - 32 mEq/L   Glucose, Bld 104 (*) 70 - 99 mg/dL   BUN 16  6 - 23 mg/dL   Creatinine, Ser 0.86  0.50 - 1.35 mg/dL   Calcium 8.2 (*) 8.4 - 10.5 mg/dL   Total Protein 5.6 (*) 6.0 - 8.3 g/dL   Albumin 2.5 (*) 3.5 - 5.2 g/dL   AST 36  0 - 37 U/L   ALT 34  0 - 53 U/L   Alkaline Phosphatase 56  39 - 117 U/L   Total Bilirubin 0.2 (*) 0.3 - 1.2 mg/dL   GFR calc non Af Amer 61 (*) >90 mL/min   GFR calc Af Amer 70 (*) >90 mL/min   Comment:            The eGFR has been calculated     using the CKD EPI equation.     This calculation has not  been     validated in all clinical     situations.     eGFR's persistently     <90 mL/min signify     possible Chronic Kidney Disease.     HEENT: normal Cardio: murmur and Grade 3 SEM Resp: Wheezes and unlabored GI: BS positive and non tender Extremity:  No Edema Skin:   Other multiple ecchymotic areas on arms Neuro: Alert/Oriented, Cranial Nerve II-XII normal, Normal Sensory and Abnormal Motor 4/5 in BUE adn BLE Musc/Skel:  Other R knee flexion contracture, no effusion Gen NAD   Assessment/Plan: 1. Functional deficits secondary to Deconditioning due to pneumonia which require 3+ hours per day of interdisciplinary therapy in a comprehensive inpatient rehab setting. Physiatrist is providing close team supervision and 24 hour management of active medical problems listed below. Physiatrist and rehab team continue to assess barriers to discharge/monitor patient progress toward functional and medical goals. FIM:                      Expression Expression: 5-Expresses basic 90% of the time/requires cueing < 10% of the time.  Social Interaction Social Interaction: 5-Interacts appropriately 90% of the time - Needs monitoring or encouragement for participation or interaction.        Medical Problem List and Plan:  1. DVT Prophylaxis/Anticoagulation: Pharmaceutical: Lovenox  2. ZO:XWRU continue  ultram prn for pain management: voltaren gel 3. Mood: Has poor memory with lack on insight (Dementia?) Family indicating depressed mood. Likely adjustment reaction due medical issues this year as well as limitations. psychiatry follow up. LCSW will follow for support. Resume Zoloft.  4. Neuropsych: This patient is not capable of making decisions on his own behalf.  5. CAP with RLL effusion: changed over to po Levaquin yesterday. Antibiotic D#7/10  6. CKD: Improved with hydration. Will recheck in am  7. HTN: Will monitor with bid checks. Hydralizine added for better control on 04/27. Will need to monitor for tolerance and effects on renal status.  8. COPD/OSA?: Patient denies any oxygen needs.  9.  Aortic stenosis no signs of failure 10.  Polymyalgia Rheumatica- MTX 10mg  q sunday  LOS (Days) 1 A FACE TO FACE EVALUATION WAS PERFORMED  KIRSTEINS,ANDREW E 04/23/2013, 7:09 AM

## 2013-04-24 ENCOUNTER — Encounter (HOSPITAL_COMMUNITY): Payer: Medicare Other | Admitting: Occupational Therapy

## 2013-04-24 ENCOUNTER — Inpatient Hospital Stay (HOSPITAL_COMMUNITY): Payer: Medicare Other

## 2013-04-24 ENCOUNTER — Inpatient Hospital Stay (HOSPITAL_COMMUNITY): Payer: Medicare Other | Admitting: Occupational Therapy

## 2013-04-24 ENCOUNTER — Inpatient Hospital Stay (HOSPITAL_COMMUNITY): Payer: Medicare Other | Admitting: Physical Therapy

## 2013-04-24 DIAGNOSIS — R5381 Other malaise: Secondary | ICD-10-CM

## 2013-04-24 DIAGNOSIS — J159 Unspecified bacterial pneumonia: Secondary | ICD-10-CM

## 2013-04-24 MED ORDER — ENSURE COMPLETE PO LIQD
120.0000 mL | Freq: Four times a day (QID) | ORAL | Status: DC
  Administered 2013-04-24 – 2013-04-30 (×15): 120 mL via ORAL

## 2013-04-24 MED ORDER — ENSURE COMPLETE PO LIQD: 237.0000 mL | Freq: Two times a day (BID) | ORAL | Status: DC

## 2013-04-24 MED ORDER — ALPRAZOLAM 0.25 MG PO TABS
0.2500 mg | ORAL_TABLET | Freq: Two times a day (BID) | ORAL | Status: DC | PRN
  Administered 2013-04-24: 0.25 mg via ORAL
  Filled 2013-04-24: qty 1

## 2013-04-24 MED ORDER — FAMOTIDINE 20 MG PO TABS
20.0000 mg | ORAL_TABLET | Freq: Two times a day (BID) | ORAL | Status: DC
  Administered 2013-04-24 – 2013-04-30 (×13): 20 mg via ORAL
  Filled 2013-04-24 (×17): qty 1

## 2013-04-24 NOTE — Progress Notes (Signed)
Patient ID: [name], male   DOB: 1923/07/27, 79 y.o.   MRN: [id number] Subjective/Complaints: Slept better last night Review of Systems  Musculoskeletal: Positive for joint pain.       B knee pain, moreso on right today        Objective: Vital Signs: Blood pressure 130/78, pulse 68, temperature 98.2 F (36.8 C), temperature source Oral, resp. rate 18, height 5\' 8"  (1.727 m), weight 88.253 kg (194 lb 9 oz), SpO2 91.00%. No results found. Results for orders placed during the hospital encounter of 04/22/13 (from the past 72 hour(s))  CBC WITH DIFFERENTIAL     Status: Abnormal   Collection Time    04/23/13  5:18 AM      Result Value Range   WBC 5.4  4.0 - 10.5 K/uL   RBC 3.72 (*) 4.22 - 5.81 MIL/uL   Hemoglobin 12.1 (*) 13.0 - 17.0 g/dL   HCT 11.9 (*) 14.7 - 82.9 %   MCV 95.2  78.0 - 100.0 fL   MCH 32.5  26.0 - 34.0 pg   MCHC 34.2  30.0 - 36.0 g/dL   RDW 56.2  13.0 - 86.5 %   Platelets 280  150 - 400 K/uL   Neutrophils Relative 52  43 - 77 %   Neutro Abs 2.8  1.7 - 7.7 K/uL   Lymphocytes Relative 31  12 - 46 %   Lymphs Abs 1.7  0.7 - 4.0 K/uL   Monocytes Relative 15 (*) 3 - 12 %   Monocytes Absolute 0.8  0.1 - 1.0 K/uL   Eosinophils Relative 3  0 - 5 %   Eosinophils Absolute 0.2  0.0 - 0.7 K/uL   Basophils Relative 0  0 - 1 %   Basophils Absolute 0.0  0.0 - 0.1 K/uL  COMPREHENSIVE METABOLIC PANEL     Status: Abnormal   Collection Time    04/23/13  5:18 AM      Result Value Range   Sodium 134 (*) 135 - 145 mEq/L   Potassium 3.7  3.5 - 5.1 mEq/L   Chloride 97  96 - 112 mEq/L   CO2 30  19 - 32 mEq/L   Glucose, Bld 104 (*) 70 - 99 mg/dL   BUN 16  6 - 23 mg/dL   Creatinine, Ser 7.84  0.50 - 1.35 mg/dL   Calcium 8.2 (*) 8.4 - 10.5 mg/dL   Total Protein 5.6 (*) 6.0 - 8.3 g/dL   Albumin 2.5 (*) 3.5 - 5.2 g/dL   AST 36  0 - 37 U/L   ALT 34  0 - 53 U/L   Alkaline Phosphatase 56  39 - 117 U/L   Total Bilirubin 0.2 (*) 0.3 - 1.2 mg/dL   GFR calc non Af Amer 61 (*) >90  mL/min   GFR calc Af Amer 70 (*) >90 mL/min   Comment:            The eGFR has been calculated     using the CKD EPI equation.     This calculation has not been     validated in all clinical     situations.     eGFR's persistently     <90 mL/min signify     possible Chronic Kidney Disease.     HEENT: normal Cardio: murmur and Grade 3 SEM Resp: Wheezes and unlabored GI: BS positive and non tender Extremity:  No Edema Skin:   Other multiple ecchymotic areas on arms Neuro:  Alert/Oriented, Cranial Nerve II-XII normal, Normal Sensory and Abnormal Motor 4/5 in BUE adn BLE Musc/Skel:  Other R knee flexion contracture, no effusion, mild tenderness with palpation. No instability Gen NAD   Assessment/Plan: 1. Functional deficits secondary to Deconditioning due to pneumonia which require 3+ hours per day of interdisciplinary therapy in a comprehensive inpatient rehab setting. Physiatrist is providing close team supervision and 24 hour management of active medical problems listed below. Physiatrist and rehab team continue to assess barriers to discharge/monitor patient progress toward functional and medical goals. FIM: FIM - Bathing Bathing Steps Patient Completed: Chest;Right Arm;Left Arm;Abdomen;Right upper leg;Left upper leg Bathing: 3: Mod-Patient completes 5-7 21f 10 parts or 50-74%  FIM - Upper Body Dressing/Undressing Upper body dressing/undressing steps patient completed: Thread/unthread right sleeve of pullover shirt/dresss;Thread/unthread left sleeve of pullover shirt/dress;Put head through opening of pull over shirt/dress Upper body dressing/undressing: 4: Min-Patient completed 75 plus % of tasks FIM - Lower Body Dressing/Undressing Lower body dressing/undressing: 1: Total-Patient completed less than 25% of tasks        FIM - Bed/Chair Transfer Bed/Chair Transfer: 4: Chair or W/C > Bed: Min A (steadying Pt. > 75%);4: Bed > Chair or W/C: Min A (steadying Pt. > 75%)  FIM -  Locomotion: Wheelchair Locomotion: Wheelchair: 0: Activity did not occur FIM - Locomotion: Ambulation Locomotion: Ambulation Assistive Devices: Designer, industrial/product Ambulation/Gait Assistance: 4: Min assist Locomotion: Ambulation: 2: Travels 50 - 149 ft with minimal assistance (Pt.>75%)  Comprehension Comprehension Mode: Auditory Comprehension: 5-Understands complex 90% of the time/Cues < 10% of the time  Expression Expression: 5-Expresses complex 90% of the time/cues < 10% of the time  Social Interaction Social Interaction: 5-Interacts appropriately 90% of the time - Needs monitoring or encouragement for participation or interaction.  Problem Solving Problem Solving: 5-Solves basic 90% of the time/requires cueing < 10% of the time  Memory Memory: 5-Recognizes or recalls 90% of the time/requires cueing < 10% of the time  Medical Problem List and Plan:  1. DVT Prophylaxis/Anticoagulation: Pharmaceutical: Lovenox  2. ZO:XWRU continue ultram prn for pain management: voltaren gel  -check xray today 3. Mood: Has poor memory with lack on insight (Dementia?) Family indicating depressed mood. Likely adjustment reaction due medical issues this year as well as limitations. psychiatry follow up. LCSW will follow for support. Resumed Zoloft.  4. Neuropsych: This patient is not capable of making decisions on his own behalf.  5. CAP with RLL effusion: changed over to po Levaquin yesterday. Antibiotic through 5/2 6. CKD: Improved with hydration. BUN/CR within normal limits 7. HTN: Will monitor with bid checks. Hydralizine added for better control on 04/27. Will need to monitor for tolerance and effects on renal status.  8. COPD/OSA?: Patient denies any oxygen needs.  9.  Aortic stenosis no signs of failure 10.  Polymyalgia Rheumatica- MTX 10mg  q sunday  LOS (Days) 2 A FACE TO FACE EVALUATION WAS PERFORMED  Nathanael Krist T 04/24/2013, 8:32 AM

## 2013-04-24 NOTE — Progress Notes (Signed)
Physical Therapy Note  Patient Details  Name: [name] MRN: [id number] Date of Birth: Jan 23, 1923 Today's Date: 04/24/2013  Time: 1400-1445 45 minutes  No c/o pain.  Gait training outdoors with RW at supervision level.  Pt able to negotiate ramps, stairs and uneven surfaces with supervision.  Pt with much increased fatigue with ambulation outdoors, requires frequent, prolonged rest breaks.  Bathroom mobility and transfers with mod I, mod I for maneuvering safely with RW in home environment in room and for standing balance with hygiene and grooming tasks.  Individual therapy   [name] 04/24/2013, 2:50 PM

## 2013-04-24 NOTE — Progress Notes (Signed)
Occupational Therapy Session Note  Patient Details  Name: [name] MRN: [id number] Date of Birth: 02-20-1923  Today's Date: 04/24/2013 Time: 0930-1025 Time Calculation (min): 55 min  Short Term Goals: Week 1:  OT Short Term Goal 1 (Week 1): STG=LTG  Skilled Therapeutic Interventions/Progress Updates:    1:1 self care retraining at shower level with focus on functional ambulation around room and bathroom with RW, activity tolerance, education on importance of rest breaks with fatigue for energy conservation and return O2 sats to 90% or higher, standing tolerance at sink to perform grooming task. Pt able to tolerate being off supplemental O2 (just on RA): in shower pt desated to 85% but within 30 sec of pursed lip breathing pt able to return to >90%. Pt did required multiple rest breaks throughout session. Pt still reports lightheadedness -but reports better with TEDS .  Therapy Documentation Precautions:  Precautions Precautions: Fall Precaution Comments: monitor O2 sats on RA Restrictions Weight Bearing Restrictions: No Pain:  no c/o pain  See FIM for current functional status  Therapy/Group: Individual Therapy  Roney Mans [facility] 04/24/2013, 1:33 PM

## 2013-04-24 NOTE — Progress Notes (Signed)
Physical Therapy Note  Patient Details  Name: [name] MRN: [id number] Date of Birth: 06-Aug-1923 Today's Date: 04/24/2013  1:30 - 2:00 30 minutes Individual session  Patient denies pain.  Patient sitting in recliner upon entering room. Patient sit to stand and ambulated 150 feet with rolling walker and close supervision. Patient performed 10 reps each of the [place] exercises including sitting knee extension, hip abduction standing, knee flexion standing, squats and up on toes/heels with UE support. Patient ambulated an additional 150 feet with RW and close supervision. O2 sats checked periodically during session at 93 - 95% on room air.   [name] M 04/24/2013, 2:39 PM

## 2013-04-24 NOTE — Progress Notes (Signed)
Physical Therapy Note  Patient Details  Name: [name] MRN: [id number] Date of Birth: [birth year] Today's Date: 04/24/2013  Time: 1045-1125 40 minutes  No c/o pain.  Gait training in controlled and household environments with supervision with RW.  Pt able to increase distance to 100' today before needing seated rest.  Gait with obstacle negotiation with RW with supervision.  Step ups and sit to stands for strengthening and activity tolerance.  Pt able to negotiate curb step up with RW with supervision.  Nu step for activity tolerance x 6 minutes.  Pt maintained O2 sats > 90% throughout session on room air.  Individual therapy   [name] 04/24/2013, 12:07 PM

## 2013-04-25 ENCOUNTER — Inpatient Hospital Stay (HOSPITAL_COMMUNITY): Payer: Medicare Other | Admitting: Physical Therapy

## 2013-04-25 ENCOUNTER — Inpatient Hospital Stay (HOSPITAL_COMMUNITY): Payer: Medicare Other | Admitting: *Deleted

## 2013-04-25 NOTE — Progress Notes (Signed)
Occupational Therapy Note  Patient Details  Name: ELIYAHU BILLE MRN: [id number] Date of Birth: [birth year] Today's Date: 04/25/2013  Time:  1100-1200  ( ) Pain:  None Individual ssession  1st session:  Engaged in therapeutic bathing and dressing at shower level.  Addressed bed mobility, sit to stand, standing balance, activity tolerance, endurance.  Pt. Ambulated with RW to bathroom and stood to urinate with SBA.  Pt.performed shower transfer with instructional cues and SBA.  Assisted pt with washing feet.  Oxygen saturation was 90 % or higher during session.  Ambulated to wc and shaved  with minimal assist.  Left pt in recliner with call bell in place.    2nd session:  Time:  1500-1530  (30 min)   Individual session   Pain:  None Addressed functional mobility, sit to stand, endurance, path finding.  Pt. Ambulated with RW from room to [facility] entrance with close supervision and cues to step over the thresholds.  Gave pt instructions to find his way to elevators.  He needed minimal cues to find elevators and minimal cues to push elevator button.  Pt walked half of the way back to his room and then needed to sit in wc.  Pt. Transferred to recliner and left in room with call bell in place.       Humberto Seals 04/25/2013, 11:09 AM

## 2013-04-25 NOTE — Progress Notes (Signed)
Physical Therapy Note  Patient Details  Name: [name] MRN: [id number] Date of Birth: 1923-05-05 Today's Date: 04/25/2013  Time 1: 845-943 58 minutes  1:1 No c/o pain.  Pt performed treatment on room air with spO2 90% or higher throughout treatment.  Treatment focused on increasing activity tolerance and strength.  Gait with RW multiple attempts of 100' or less with distant supervision, pt continues to be limited in distance by decreased cardiorespiratory endurance.  [place] HEP performed for strength and balance to reduce risk of falls.  Step ups and tap ups with UE support for strength and endurance.  Bathroom mobility and transfers with RW mod I.  Pt continues to require frequent and prolonged rest breaks.  Time 2: 1400-1440 40 minutes  1:1 No c/o pain.  Pt continues to work on Scientist, clinical (histocompatibility and immunogenetics).  Gait with RW up to 150' multiple attempts with supervision. Nu step level 4 x without rest breaks.  ADL apartment to practice gait in household environment, bed mobility and furniture transfers all with supervision.  Pt is excited to d/c home on Wednesday.   Kathleena Freeman 04/25/2013, 9:43 AM

## 2013-04-25 NOTE — Progress Notes (Signed)
Patient ID: [name], male   DOB: 1923/05/27, 79 y.o.   MRN: [id number] Subjective/Complaints: Right knee still a little sore. No issues overnight A 12 point review of systems has been performed and if not noted above is otherwise negative.        Objective: Vital Signs: Blood pressure 109/61, pulse 72, temperature 98.1 F (36.7 C), temperature source Oral, resp. rate 20, height 5\' 8"  (1.727 m), weight 88.253 kg (194 lb 9 oz), SpO2 91.00%. Dg Knee 1-2 Views Right  04/24/2013  *RADIOLOGY REPORT*  Clinical Data: Chronic right knee pain.  No injury.  History of right knee replacement.  RIGHT KNEE - 1-2 VIEW  Comparison: None.  Findings: Uncomplicated three-part right total knee arthroplasty is present.  There is no evidence of hardware loosening or failure. No periprosthetic fracture.  Atherosclerosis noted.  No effusion.  IMPRESSION: Uncomplicated right three-part total knee arthroplasty.   Original Report Authenticated By: Andreas Newport, M.D.    Results for orders placed during the hospital encounter of 04/22/13 (from the past 72 hour(s))  CBC WITH DIFFERENTIAL     Status: Abnormal   Collection Time    04/23/13  5:18 AM      Result Value Range   WBC 5.4  4.0 - 10.5 K/uL   RBC 3.72 (*) 4.22 - 5.81 MIL/uL   Hemoglobin 12.1 (*) 13.0 - 17.0 g/dL   HCT 40.9 (*) 81.1 - 91.4 %   MCV 95.2  78.0 - 100.0 fL   MCH 32.5  26.0 - 34.0 pg   MCHC 34.2  30.0 - 36.0 g/dL   RDW 78.2  95.6 - 21.3 %   Platelets 280  150 - 400 K/uL   Neutrophils Relative 52  43 - 77 %   Neutro Abs 2.8  1.7 - 7.7 K/uL   Lymphocytes Relative 31  12 - 46 %   Lymphs Abs 1.7  0.7 - 4.0 K/uL   Monocytes Relative 15 (*) 3 - 12 %   Monocytes Absolute 0.8  0.1 - 1.0 K/uL   Eosinophils Relative 3  0 - 5 %   Eosinophils Absolute 0.2  0.0 - 0.7 K/uL   Basophils Relative 0  0 - 1 %   Basophils Absolute 0.0  0.0 - 0.1 K/uL  COMPREHENSIVE METABOLIC PANEL     Status: Abnormal   Collection Time    04/23/13  5:18 AM      Result  Value Range   Sodium 134 (*) 135 - 145 mEq/L   Potassium 3.7  3.5 - 5.1 mEq/L   Chloride 97  96 - 112 mEq/L   CO2 30  19 - 32 mEq/L   Glucose, Bld 104 (*) 70 - 99 mg/dL   BUN 16  6 - 23 mg/dL   Creatinine, Ser 0.86  0.50 - 1.35 mg/dL   Calcium 8.2 (*) 8.4 - 10.5 mg/dL   Total Protein 5.6 (*) 6.0 - 8.3 g/dL   Albumin 2.5 (*) 3.5 - 5.2 g/dL   AST 36  0 - 37 U/L   ALT 34  0 - 53 U/L   Alkaline Phosphatase 56  39 - 117 U/L   Total Bilirubin 0.2 (*) 0.3 - 1.2 mg/dL   GFR calc non Af Amer 61 (*) >90 mL/min   GFR calc Af Amer 70 (*) >90 mL/min   Comment:            The eGFR has been calculated     using the CKD  EPI equation.     This calculation has not been     validated in all clinical     situations.     eGFR's persistently     <90 mL/min signify     possible Chronic Kidney Disease.     HEENT: normal Cardio: murmur and Grade 3 SEM Resp: Wheezes and unlabored GI: BS positive and non tender Extremity:  No Edema in lower ext Skin:   Other multiple ecchymotic areas on arms Neuro: Alert/Oriented, Cranial Nerve II-XII normal, Normal Sensory and Abnormal Motor 4/5 in BUE adn BLE Musc/Skel:  Other R knee flexion contracture, no effusion, mild tenderness with palpation. No instability Gen NAD   Assessment/Plan: 1. Functional deficits secondary to Deconditioning due to pneumonia which require 3+ hours per day of interdisciplinary therapy in a comprehensive inpatient rehab setting. Physiatrist is providing close team supervision and 24 hour management of active medical problems listed below. Physiatrist and rehab team continue to assess barriers to discharge/monitor patient progress toward functional and medical goals. FIM: FIM - Bathing Bathing Steps Patient Completed: Chest;Right Arm;Left Arm;Abdomen;Right upper leg;Left upper leg;Front perineal area;Buttocks Bathing: 4: Min-Patient completes 8-9 70f 10 parts or 75+ percent  FIM - Upper Body Dressing/Undressing Upper body  dressing/undressing steps patient completed: Thread/unthread right sleeve of pullover shirt/dresss;Thread/unthread left sleeve of pullover shirt/dress;Put head through opening of pull over shirt/dress Upper body dressing/undressing: 5: Supervision: Safety issues/verbal cues FIM - Lower Body Dressing/Undressing Lower body dressing/undressing steps patient completed: Thread/unthread right underwear leg;Thread/unthread left underwear leg;Pull underwear up/down;Thread/unthread right pants leg;Thread/unthread left pants leg;Pull pants up/down;Don/Doff left shoe;Fasten/unfasten right shoe;Fasten/unfasten left shoe;Don/Doff right shoe Lower body dressing/undressing: 5: Set-up assist to: Don/Doff TED stocking  FIM - Toileting Toileting steps completed by patient: Adjust clothing prior to toileting;Performs perineal hygiene;Adjust clothing after toileting Toileting: 5: Supervision: Safety issues/verbal cues  FIM - Archivist Transfers Assistive Devices: Walker;Grab bars Toilet Transfers: 5-To toilet/BSC: Supervision (verbal cues/safety issues);5-From toilet/BSC: Supervision (verbal cues/safety issues)  FIM - Banker Devices: Therapist, occupational: 5: Supine > Sit: Supervision (verbal cues/safety issues);5: Chair or W/C > Bed: Supervision (verbal cues/safety issues)  FIM - Locomotion: Wheelchair Locomotion: Wheelchair: 0: Activity did not occur FIM - Locomotion: Ambulation Locomotion: Ambulation Assistive Devices: Designer, industrial/product Ambulation/Gait Assistance: 5: Supervision Locomotion: Ambulation: 5: Travels 150 ft or more with supervision/safety issues  Comprehension Comprehension Mode: Auditory Comprehension: 5-Understands complex 90% of the time/Cues < 10% of the time  Expression Expression: 5-Expresses complex 90% of the time/cues < 10% of the time  Social Interaction Social Interaction: 5-Interacts appropriately 90% of the time -  Needs monitoring or encouragement for participation or interaction.  Problem Solving Problem Solving: 5-Solves basic 90% of the time/requires cueing < 10% of the time  Memory Memory: 5-Recognizes or recalls 90% of the time/requires cueing < 10% of the time  Medical Problem List and Plan:  1. DVT Prophylaxis/Anticoagulation: Pharmaceutical: Lovenox  2. ZO:XWRU continue ultram prn for pain management: voltaren gel  -knee xr shows intact prosthesis. No fx, effusion 3. Mood: Has poor memory with lack on insight (Dementia?) Family indicating depressed mood. Likely adjustment reaction due medical issues this year as well as limitations. psychiatry follow up.   Resumed Zoloft.  4. Neuropsych: This patient is not capable of making decisions on his own behalf.  5. CAP with RLL effusion: changed over to po Levaquin yesterday. Antibiotic through 5/2 6. CKD: Improved with hydration. BUN/CR within normal limits 7. HTN: Will monitor with bid  checks. Hydralizine added for better control on 04/27.    -recheck BMET monday 8. COPD/OSA?: Patient denies any oxygen needs.  9.  Aortic stenosis no signs of failure 10.  Polymyalgia Rheumatica- MTX 10mg  q sunday  LOS (Days) 3 A FACE TO FACE EVALUATION WAS PERFORMED  SWARTZ,[name] 04/25/2013, 7:55 AM

## 2013-04-25 NOTE — Progress Notes (Signed)
Social Work  Social Work Assessment and Plan  Patient Details  Name: [name] MRN: [id number] Date of Birth: [birth year]  Today's Date: 77/01/2013  Problem List:  Patient Active Problem List   Diagnosis Date Noted  . Physical deconditioning 04/22/2013  . PNA (pneumonia) 04/17/2013  . Pleural effusion 04/17/2013  . Syncope 12/24/2012  . Headache 12/05/2012  . General weakness 12/05/2012  . Fall 12/05/2012  . Ulcer 11/20/2012  . Acute kidney failure 11/20/2012  . Hemoptysis 11/19/2012  . Aphthous ulcer 11/19/2012  . Pneumothorax, right 11/19/2012  . PAF (paroxysmal atrial fibrillation) 11/19/2012  . History of CVA (cerebrovascular accident) 11/19/2012  . PMR (polymyalgia rheumatica) 05/29/2012  . History of MI (myocardial infarction) 05/29/2012  . Hypogonadism male 05/29/2012  . Anxiety 05/29/2012  . Hyperparathyroidism 05/29/2012  . TIA (transient ischemic attack) 05/29/2012  . CKD (chronic kidney disease) stage 4, GFR 15-29 ml/min 05/29/2012  . COPD (chronic obstructive pulmonary disease) 05/29/2012  . OSA (obstructive sleep apnea) 05/29/2012  . Gait disorder 05/29/2012  . Chronic steroid use 05/29/2012  . Depression   . Preventative health care 05/18/2012  . AS (aortic stenosis)   . AF (paroxysmal atrial fibrillation)   . DM (diabetes mellitus)   . Shingles   . Left carotid stenosis   . Osteoarthritis   . Nephrolithiasis   . Obesity   . Hx of colonoscopy   . HYPERTENSION, BENIGN 04/07/2010  . AORTIC STENOSIS/ INSUFFICIENCY, NON-RHEUMATIC 04/05/2009  . LEG CRAMPS, NOCTURNAL 10/07/2008  . HYPERKALEMIA 10/01/2008  . RENAL FAILURE 10/01/2008  . DERMATITIS, FACE 10/01/2008  . SLEEP APNEA, OBSTRUCTIVE 09/28/2008  . CONTACT DERMATITIS&OTHER ECZEMA DUE TO SUNBURN 07/28/2008  . DEPRESSIVE DISORDER NOT ELSEWHERE CLASSIFIED 07/24/2008  . HYPOKALEMIA 07/21/2008  . RECTAL BLEEDING 07/21/2008  . Edema 07/08/2008  . Atrial flutter 06/25/2008  . Pernicious anemia  06/23/2008  . FATIGUE 06/23/2008  . PROSTATE CANCER, HX OF 06/20/2008  . VITAMIN B12 DEFICIENCY 06/17/2008  . BACK PAIN 06/15/2008  . LIPOMA 04/30/2008  . HYPOTHYROIDISM 04/21/2008  . HYPERLIPIDEMIA 04/21/2008  . ANEMIA 04/21/2008  . CORONARY ARTERY DISEASE 04/21/2008  . ALLERGIC RHINITIS 04/21/2008  . BURSITIS 04/21/2008  . PLANTAR FASCIITIS 04/21/2008  . UNS ADVRS EFF OTH RX MEDICINAL&BIOLOGICAL SBSTNC 04/21/2008   Past Medical History:  Past Medical History  Diagnosis Date  . CAD (coronary artery disease)     s/p NSTEMI and BMS stent diagonal07/09;  Lexiscan Myoview 9/13:  EF 62%, no ischemia  . AS (aortic stenosis)     a.  Echo 2009 mean gradient 9mm HG. AVA 2.18;  b.  Echo 4/11 mild AS mean gradient 10mm HG;  c.  Echo 04/2011: Mild LVH, EF 55-60%, grade 1 diastolic dysfunction, mild aortic stenosis, mean gradient 14, mild LAE;  d. Echo 9/13: mod LVH, EF 50-55%, mild to mod AS, mean gradient 17 mmHg  . AF (paroxysmal atrial fibrillation)     Holter monitor 2.5 sec pauses  . Fatigue     CPX 11/09 VO2  14.4 (75% predicte)d, slope 35.  O2 pulse normal.  VO2 corrected for body weight 17.6.  Marland Kitchen Hypothyroidism   . Hyperlipidemia   . History of prostate cancer   . Allergic rhinitis   . DM (diabetes mellitus)     type II diet controlled  . Shingles   . Left carotid stenosis   . Vitamin B 12 deficiency   . Anemia   . Osteoarthritis   . Nephrolithiasis   . Obesity   .  OSA (obstructive sleep apnea)     AHI 15 PSG 09/06/08  . Hx of colonoscopy   . Stroke   . Anxiety and depression   . PMR (polymyalgia rheumatica) 05/29/2012  . Hypogonadism male 05/29/2012  . Hyperparathyroidism 05/29/2012  . TIA (transient ischemic attack) 05/29/2012  . CKD (chronic kidney disease) stage 4, GFR 15-29 ml/min 05/29/2012  . COPD (chronic obstructive pulmonary disease) 05/29/2012  . Myocardial infarction   . Cancer     prostate ca history   Past Surgical History:  Past Surgical History  Procedure  Laterality Date  . Appendectomy    . Hernia repair    . Right carpal tunnel release    . Lumbar spine surgery    . Cystectomy      neck  . Prostatectomy    . Vasectomy    . Total knee arthroplasty      right  . Cholecystectomy     Social History:  reports that he quit smoking about 54 years ago. His smoking use included Cigarettes. He has a 12.5 pack-year smoking history. He has quit using smokeless tobacco. He reports that he does not drink alcohol or use illicit drugs.  Family / Support Systems Marital Status: Married How Long?: 68 years Patient Roles: Spouse;Parent;Caregiver Spouse/Significant Other: wife, [name] (Alzheimers) Children: daughter, [name] @ [phone] or (W) [phone];  son, Don Wenke") [name] @ (C) [phone]  (and one daughter who died approx 4 yrs ago) Other Supports: live - in housekeeper/ aide, [name] @ [phone] Anticipated Caregiver: housekeeper Ability/Limitations of Caregiver: [name] does assist pt's wife with bathing/ dressing;  overall, light physical assistance for both Caregiver Availability: 24/7 Family Dynamics: Pt notes children are supportive, however, [name] provide most assist.  Social History Preferred language: English Religion: Mormon Cultural Background: NA Education: HS Read: Yes Write: Yes Employment Status: Retired Date Retired/Disabled/Unemployed: 79 y.o. Legal Hisotry/Current Legal Issues: none Guardian/Conservator: none   Abuse/Neglect Physical Abuse: Denies Verbal Abuse: Denies Sexual Abuse: Denies Exploitation of patient/patient's resources: Denies Self-Neglect: Denies  Emotional Status Pt's affect, behavior adn adjustment status: Pt very pleasant, talkative gentleman who is A&O x 4 overall, however, occasionally loses his train of thought and ask, "... what was I saying...".   Conversations usually returns to comments about how long he and wife have been married and "... what a life we had... a  good life..."  Denies any significatn emotional distress but will monitor.  Does seem a little "homesick" as he comments about how happy he is LOS expected to be short, "It's hard to be away from someone you've been with for 68 years" Recent Psychosocial Issues: None Pyschiatric History: None Substance Abuse History: None  Patient / Family Perceptions, Expectations & Goals Pt/Family understanding of illness & functional limitations: Pt with very basic understanding that he was suffering from pneumonia when admitted and is here on CIR due to overall deconditionined state. Premorbid pt/family roles/activities: Pt notes he is independent at home.  [name]'s primary role is for upkeep of house and assistance to his wife.   Anticipated changes in roles/activities/participation: Notes Nena Jordan be able to assist him a little if needed.  Otherwise, little change in roles. Pt/family expectations/goals: "I just want to get home."  [employer]: None Premorbid Home Care/DME Agencies: None Transportation available at discharge: yes  Discharge Planning Living Arrangements: Other (Comment);Spouse/significant other Support Systems: Children;Other (Comment) (housekeeper/aide,  [name]) Type of Residence: Private residence [employer] Resources: [employer] (specify) ([employer]) Financial Resources: Restaurant manager, fast food Screen Referred: No Living Expenses: Own Money Management: Patient Do you have any problems obtaining your medications?: No Home Management: [name] Patient/Family Preliminary Plans: Pt plans to return home with his wife where they have a 24/7 live-in housekeeper and aide Social Work Anticipated Follow Up Needs: HH/OP Expected length of stay: ELOS 7 to 10 days  Clinical Impression Pleasant, elderly, talkative gentleman here due to physical deconditioning following pneumonia.  Lives with wife who suffers with Alzheimers (physicall  independent overall), however, they do have a live-in housekeeper/ aide (for wife) who can assist after pt's d/c.  No significant emotional distress noted, except possibly a little home sickness.  Will follow for support and d/c planning.  Asah Lamay 04/25/2013, 3:52 PM

## 2013-04-26 ENCOUNTER — Inpatient Hospital Stay (HOSPITAL_COMMUNITY): Payer: Medicare Other | Admitting: Physical Therapy

## 2013-04-26 ENCOUNTER — Inpatient Hospital Stay (HOSPITAL_COMMUNITY): Payer: Medicare Other | Admitting: Occupational Therapy

## 2013-04-26 ENCOUNTER — Inpatient Hospital Stay (HOSPITAL_COMMUNITY): Payer: Medicare Other | Admitting: *Deleted

## 2013-04-26 DIAGNOSIS — R5381 Other malaise: Secondary | ICD-10-CM

## 2013-04-26 DIAGNOSIS — J159 Unspecified bacterial pneumonia: Secondary | ICD-10-CM

## 2013-04-26 NOTE — Progress Notes (Signed)
Occupational Therapy Session Note  Patient Details  Name: [name] MRN: [id number] Date of Birth: [birth year]  Today's Date: 04/26/2013 Time: 0730-0823 Time Calculation (min): 53 min  Skilled Therapeutic Interventions/Progress Updates: ADL in w/c with focus on endurance.   Patient pleasant and required cues to complete as much of the session as possible.  Patient stated that he has a paid caregiver at home to assist with self care.  Patient with no episodes of decreased blood pressure (around 126/62 at end of session per other clinician report). As well 02 92 during session and patient with no complaints otherwise.     Therapy Documentation Precautions:  Precautions Precautions: Fall Precaution Comments: monitor O2 sats on RA Restrictions Weight Bearing Restrictions: No   Pain:denied   See FIM for current functional status  Therapy/Group: Individual Therapy  Bud Face [facility] 04/26/2013, 3:31 PM

## 2013-04-26 NOTE — Progress Notes (Signed)
Occuational Therapy Note  Patient Details  Name: [name] MRN: [id number] Date of Birth: 10-28-1923 Today's Date: 04/26/2013  Time:  1530-1630  (60 min) Pain:  Occasional back pain Individual session  Engaged in therapeutic activity in kitchen to address endurance, activity tolerance, transfers, walker safety, standing tolerance.  Pt. Prepared simple stove task of ramen noodles with minimal assist due to fatigue.  Utilized stool for energy conservation.  Pt.  Stood at stove for 8 minutes with some complaints of back pain.  Ambulated to bathroom and had small BM and ambulated back to kitchen to complete task. Assisted pt with clean up due to pt being tired.  Took pt back to room and transferred to recliner with call bell in reach.       Humberto Seals 04/26/2013, 4:19 PM

## 2013-04-26 NOTE — Progress Notes (Signed)
Patient ID: [name], male   DOB: [birth year], 79 y.o.   MRN: [id number] Subjective/Complaints: Right knee still a little sore. No issues overnight A 12 point review of systems has been performed and if not noted above is otherwise negative.        Objective: Vital Signs: Blood pressure 155/73, pulse 74, temperature 97.5 F (36.4 C), temperature source Oral, resp. rate 18, height 5\' 8"  (1.727 m), weight 88.253 kg (194 lb 9 oz), SpO2 91.00%. Dg Knee 1-2 Views Right  04/24/2013  *RADIOLOGY REPORT*  Clinical Data: Chronic right knee pain.  No injury.  History of right knee replacement.  RIGHT KNEE - 1-2 VIEW  Comparison: None.  Findings: Uncomplicated three-part right total knee arthroplasty is present.  There is no evidence of hardware loosening or failure. No periprosthetic fracture.  Atherosclerosis noted.  No effusion.  IMPRESSION: Uncomplicated right three-part total knee arthroplasty.   Original Report Authenticated By: Andreas Newport, M.D.    No results found for this or any previous visit (from the past 72 hour(s)).   HEENT: normal Cardio: murmur and Grade 3 SEM Resp: Wheezes and unlabored GI: BS positive and non tender Extremity:  No Edema in lower ext Skin:   Other multiple ecchymotic areas on arms Neuro: Alert/Oriented, Cranial Nerve II-XII normal, Normal Sensory and Abnormal Motor 4/5 in BUE adn BLE Musc/Skel:  Other R knee flexion contracture, no effusion, mild tenderness with palpation. No instability Gen NAD   Assessment/Plan: 1. Functional deficits secondary to Deconditioning due to pneumonia which require 3+ hours per day of interdisciplinary therapy in a comprehensive inpatient rehab setting. Physiatrist is providing close team supervision and 24 hour management of active medical problems listed below. Physiatrist and rehab team continue to assess barriers to discharge/monitor patient progress toward functional and medical goals. FIM: FIM - Bathing Bathing Steps  Patient Completed: Chest;Right Arm;Left Arm;Abdomen;Right upper leg;Left upper leg;Front perineal area;Buttocks Bathing: 4: Min-Patient completes 8-9 43f 10 parts or 75+ percent  FIM - Upper Body Dressing/Undressing Upper body dressing/undressing steps patient completed: Thread/unthread right sleeve of pullover shirt/dresss;Thread/unthread left sleeve of pullover shirt/dress;Put head through opening of pull over shirt/dress Upper body dressing/undressing: 5: Supervision: Safety issues/verbal cues FIM - Lower Body Dressing/Undressing Lower body dressing/undressing steps patient completed: Thread/unthread right underwear leg;Thread/unthread left underwear leg;Pull underwear up/down;Thread/unthread right pants leg;Thread/unthread left pants leg;Pull pants up/down;Don/Doff left shoe;Fasten/unfasten right shoe;Fasten/unfasten left shoe;Don/Doff right shoe Lower body dressing/undressing: 5: Set-up assist to: Obtain clothing  FIM - Toileting Toileting steps completed by patient: Adjust clothing prior to toileting;Performs perineal hygiene;Adjust clothing after toileting Toileting: 5: Supervision: Safety issues/verbal cues  FIM - Archivist Transfers Assistive Devices: Walker;Grab bars Toilet Transfers: 5-To toilet/BSC: Supervision (verbal cues/safety issues);5-From toilet/BSC: Supervision (verbal cues/safety issues)  FIM - Banker Devices: Therapist, occupational: 6: Chair or W/C > Bed: No assist;6: Bed > Chair or W/C: No assist  FIM - Locomotion: Wheelchair Locomotion: Wheelchair: 0: Activity did not occur FIM - Locomotion: Ambulation Locomotion: Ambulation Assistive Devices: Designer, industrial/product Ambulation/Gait Assistance: 5: Supervision Locomotion: Ambulation: 5: Travels 150 ft or more with supervision/safety issues  Comprehension Comprehension Mode: Auditory Comprehension: 5-Understands complex 90% of the time/Cues < 10% of the  time  Expression Expression Mode: Verbal Expression: 5-Expresses complex 90% of the time/cues < 10% of the time  Social Interaction Social Interaction: 6-Interacts appropriately with others with medication or extra time (anti-anxiety, antidepressant).  Problem Solving Problem Solving Mode: Not assessed Problem Solving: 5-Solves basic 90%  of the time/requires cueing < 10% of the time  Memory Memory Mode: Not assessed Memory: 5-Recognizes or recalls 90% of the time/requires cueing < 10% of the time  Medical Problem List and Plan:  1. DVT Prophylaxis/Anticoagulation: Pharmaceutical: Lovenox  2. ZO:XWRU continue ultram prn for pain management: voltaren gel  -knee xr shows intact prosthesis. No fx, effusion 3. Mood: Has poor memory with lack on insight (Dementia?) Family indicating depressed mood. Likely adjustment reaction due medical issues this year as well as limitations. psychiatry follow up.   Resumed Zoloft.  4. Neuropsych: This patient is not capable of making decisions on his own behalf.  5. CAP with RLL effusion: changed over to po Levaquin yesterday. Antibiotic through 5/2 6. CKD: Improved with hydration. BUN/CR within normal limits 7. HTN: Will monitor with bid checks. Hydralizine added for better control on 04/27.    -recheck BMET monday 8. COPD/OSA?: Patient denies any oxygen needs.  9.  Aortic stenosis no signs of failure 10.  Polymyalgia Rheumatica- MTX 10mg  q sunday  LOS (Days) 4 A FACE TO FACE EVALUATION WAS PERFORMED  [name] 04/26/2013, 7:10 AM

## 2013-04-26 NOTE — Progress Notes (Signed)
Physical Therapy Note  Patient Details  Name: [name] MRN: [id number] Date of Birth: 30-Oct-1923 Today's Date: 04/26/2013  Time 1: 830-918 48 minutes  1:1 No c/o pain.  Gait with RW for increased endurance and activity tolerance in home and controlled environments with obstacle negotiation at supervision level.  Kitchen mobility for getting items from cabinets and refrigerator with supervision, cuing for use of RW in kitchen environment.  Pt states he never prepared meals for himself at home. Pt c/o increased weakness and light headedness.  BP seated 105/66, standing 88/54 with abdominal binder and TED hose in place.  RN made aware.  Pt unable to tolerate full hour of activity due to weakness and low BP.  Pt encouraged to increase fluid intake to increase BP during therapy.  Time 2: 1330-1400 30 minutes  1:1 No c/o pain.  Pt states he feels better after taking a nap.  Continued focus on improving activity tolerance with gait 3 x 3 minutes with seated rests in between.  UE strengthening with 3# weight 2 x 15 exercises in all planes.   Krista Som 04/26/2013, 9:16 AM

## 2013-04-27 ENCOUNTER — Inpatient Hospital Stay (HOSPITAL_COMMUNITY): Payer: Medicare Other | Admitting: Physical Therapy

## 2013-04-27 LAB — GLUCOSE, CAPILLARY: Glucose-Capillary: 131 mg/dL — ABNORMAL HIGH (ref 70–99)

## 2013-04-27 NOTE — Progress Notes (Signed)
Physical Therapy Note  Patient Details  Name: [name] MRN: [id number] Date of Birth: 02-Aug-1923 Today's Date: 04/27/2013  1345-1440 (55 minutes) group Pain : no reported pain Pt participated in PT group session focused on gait training/safety/endurance; pt ambulates 80 feet X 3 RW SBA; BP in sitting 117/72.    [name],[name] 04/27/2013, 2:52 PM

## 2013-04-27 NOTE — Progress Notes (Signed)
Patient ID: [name], male   DOB: 1923/11/26, 79 y.o.   MRN: [id number] Subjective/Complaints: No CP/SOB, was a little tired in PT yesterday. No issues overnight A 12 point review of systems has been performed and if not noted above is otherwise negative.        Objective: Vital Signs: Blood pressure 135/70, pulse 77, temperature 97.5 F (36.4 C), temperature source Oral, resp. rate 18, height 5\' 8"  (1.727 m), weight 88.253 kg (194 lb 9 oz), SpO2 96.00%. No results found. No results found for this or any previous visit (from the past 72 hour(s)).   HEENT: normal Cardio: murmur and Grade 3 SEM Resp: Wheezes and unlabored GI: BS positive and non tender Extremity:  No Edema in lower ext Skin:   Other multiple ecchymotic areas on arms Neuro: Drowsy, Normal Sensory and Abnormal Motor 4/5 in BUE adn BLE Musc/Skel:  Other R knee flexion contracture, no effusion, mild tenderness with palpation. No instability Gen NAD   Assessment/Plan: 1. Functional deficits secondary to Deconditioning due to pneumonia which require 3+ hours per day of interdisciplinary therapy in a comprehensive inpatient rehab setting. Physiatrist is providing close team supervision and 24 hour management of active medical problems listed below. Physiatrist and rehab team continue to assess barriers to discharge/monitor patient progress toward functional and medical goals. FIM: FIM - Bathing Bathing Steps Patient Completed: Chest;Right Arm;Left Arm;Abdomen;Front perineal area;Right upper leg;Left upper leg Bathing: 3: Mod-Patient completes 5-7 55f 10 parts or 50-74%  FIM - Upper Body Dressing/Undressing Upper body dressing/undressing steps patient completed: Thread/unthread right sleeve of pullover shirt/dresss;Put head through opening of pull over shirt/dress;Thread/unthread left sleeve of pullover shirt/dress;Pull shirt over trunk Upper body dressing/undressing: 5: Supervision: Safety issues/verbal cues FIM -  Lower Body Dressing/Undressing Lower body dressing/undressing steps patient completed: Thread/unthread right pants leg;Thread/unthread left pants leg;Pull pants up/down;Don/Doff right shoe;Don/Doff left shoe (wore ted hose and no socks today) Lower body dressing/undressing: 4: Min-Patient completed 75 plus % of tasks  FIM - Toileting Toileting steps completed by patient: Adjust clothing prior to toileting;Performs perineal hygiene;Adjust clothing after toileting Toileting: 0: Activity did not occur  FIM - Diplomatic Services operational officer Devices: Grab bars Toilet Transfers: 5-To toilet/BSC: Supervision (verbal cues/safety issues);5-From toilet/BSC: Supervision (verbal cues/safety issues)  FIM - Banker Devices: Therapist, occupational: 6: Chair or W/C > Bed: No assist;6: Bed > Chair or W/C: No assist  FIM - Locomotion: Wheelchair Locomotion: Wheelchair: 0: Activity did not occur FIM - Locomotion: Ambulation Locomotion: Ambulation Assistive Devices: Designer, industrial/product Ambulation/Gait Assistance: 5: Supervision Locomotion: Ambulation: 5: Travels 150 ft or more with supervision/safety issues  Comprehension Comprehension Mode: Auditory Comprehension: 5-Understands complex 90% of the time/Cues < 10% of the time  Expression Expression Mode: Verbal Expression: 5-Expresses complex 90% of the time/cues < 10% of the time  Social Interaction Social Interaction: 5-Interacts appropriately 90% of the time - Needs monitoring or encouragement for participation or interaction.  Problem Solving Problem Solving Mode: Not assessed Problem Solving: 5-Solves basic 90% of the time/requires cueing < 10% of the time  Memory Memory Mode: Not assessed Memory: 5-Recognizes or recalls 90% of the time/requires cueing < 10% of the time  Medical Problem List and Plan:  1. DVT Prophylaxis/Anticoagulation: Pharmaceutical: Lovenox  2. WU:JWJX continue  ultram prn for pain management: voltaren gel  -knee xr shows intact prosthesis. No fx, effusion 3. Mood: Has poor memory with lack on insight (Dementia?) Family indicating depressed mood. Likely adjustment reaction due  medical issues this year as well as limitations. psychiatry follow up.   Resumed Zoloft.  4. Neuropsych: This patient is not capable of making decisions on his own behalf.  5. CAP with RLL effusion:moniotr off abx6. CKD: Improved with hydration. BUN/CR within normal limits 7. HTN: Will monitor with bid checks. Hydralizine added for better control on 04/27.    -recheck BMET monday 8. COPD/OSA?: Patient denies any oxygen needs.  9.  Aortic stenosis no signs of failure 10.  Polymyalgia Rheumatica- MTX 10mg  q sunday  LOS (Days) 5 A FACE TO FACE EVALUATION WAS PERFORMED  KIRSTEINS,ANDREW E 04/27/2013, 7:09 AM

## 2013-04-27 NOTE — Plan of Care (Signed)
Problem: RH PAIN MANAGEMENT Goal: RH STG PAIN MANAGED AT OR BELOW PT'S PAIN GOAL Pain managed at or below 2 with Mod I assistance  Outcome: Progressing Denies discomfort

## 2013-04-28 ENCOUNTER — Inpatient Hospital Stay (HOSPITAL_COMMUNITY): Payer: Medicare Other | Admitting: Physical Therapy

## 2013-04-28 ENCOUNTER — Inpatient Hospital Stay (HOSPITAL_COMMUNITY): Payer: Medicare Other | Admitting: Occupational Therapy

## 2013-04-28 ENCOUNTER — Encounter (HOSPITAL_COMMUNITY): Payer: Medicare Other | Admitting: Occupational Therapy

## 2013-04-28 DIAGNOSIS — R5381 Other malaise: Secondary | ICD-10-CM

## 2013-04-28 DIAGNOSIS — J159 Unspecified bacterial pneumonia: Secondary | ICD-10-CM

## 2013-04-28 LAB — BASIC METABOLIC PANEL
Calcium: 8.7 mg/dL (ref 8.4–10.5)
GFR calc non Af Amer: 38 mL/min — ABNORMAL LOW (ref >90)
Glucose, Bld: 98 mg/dL (ref 70–99)
Sodium: 140 mEq/L (ref 135–145)

## 2013-04-28 NOTE — Progress Notes (Signed)
Patient ID: [name], male   DOB: Apr 30, 1923, 79 y.o.   MRN: [id number] Subjective/Complaints: Good night. No complaints this am. No problems yesterday A 12 point review of systems has been performed and if not noted above is otherwise negative.        Objective: Vital Signs: Blood pressure 119/75, pulse 71, temperature 98.1 F (36.7 C), temperature source Oral, resp. rate 19, height 5\' 8"  (1.727 m), weight 88.253 kg (194 lb 9 oz), SpO2 94.00%. No results found. Results for orders placed during the hospital encounter of 04/22/13 (from the past 72 hour(s))  GLUCOSE, CAPILLARY     Status: Abnormal   Collection Time    04/27/13  8:34 PM      Result Value Range   Glucose-Capillary 131 (*) 70 - 99 mg/dL   Comment 1 Notify RN    BASIC METABOLIC PANEL     Status: Abnormal   Collection Time    04/28/13  5:35 AM      Result Value Range   Sodium 140  135 - 145 mEq/L   Potassium 4.7  3.5 - 5.1 mEq/L   Chloride 103  96 - 112 mEq/L   CO2 27  19 - 32 mEq/L   Glucose, Bld 98  70 - 99 mg/dL   BUN 24 (*) 6 - 23 mg/dL   Creatinine, Ser 4.09 (*) 0.50 - 1.35 mg/dL   Calcium 8.7  8.4 - 81.1 mg/dL   GFR calc non Af Amer 38 (*) >90 mL/min   GFR calc Af Amer 44 (*) >90 mL/min   Comment:            The eGFR has been calculated     using the CKD EPI equation.     This calculation has not been     validated in all clinical     situations.     eGFR's persistently     <90 mL/min signify     possible Chronic Kidney Disease.     HEENT: normal Cardio: murmur and Grade 3 SEM Resp: Wheezes and unlabored GI: BS positive and non tender Extremity:  No Edema in lower ext Skin:   Other multiple ecchymotic areas on arms Neuro: Drowsy, Normal Sensory and Abnormal Motor 4/5 in BUE adn BLE Musc/Skel:  Other R knee flexion contracture, no effusion, mild tenderness with palpation. No instability Gen NAD   Assessment/Plan: 1. Functional deficits secondary to Deconditioning due to pneumonia which  require 3+ hours per day of interdisciplinary therapy in a comprehensive inpatient rehab setting. Physiatrist is providing close team supervision and 24 hour management of active medical problems listed below. Physiatrist and rehab team continue to assess barriers to discharge/monitor patient progress toward functional and medical goals. FIM: FIM - Bathing Bathing Steps Patient Completed: Chest;Right Arm;Left Arm;Abdomen;Front perineal area;Right upper leg;Left upper leg Bathing: 3: Mod-Patient completes 5-7 57f 10 parts or 50-74%  FIM - Upper Body Dressing/Undressing Upper body dressing/undressing steps patient completed: Thread/unthread right sleeve of pullover shirt/dresss;Put head through opening of pull over shirt/dress;Thread/unthread left sleeve of pullover shirt/dress;Pull shirt over trunk Upper body dressing/undressing: 5: Supervision: Safety issues/verbal cues FIM - Lower Body Dressing/Undressing Lower body dressing/undressing steps patient completed: Thread/unthread right pants leg;Thread/unthread left pants leg;Pull pants up/down;Don/Doff right shoe;Don/Doff left shoe (wore ted hose and no socks today) Lower body dressing/undressing: 4: Min-Patient completed 75 plus % of tasks  FIM - Toileting Toileting steps completed by patient: Adjust clothing prior to toileting;Performs perineal hygiene;Adjust clothing after toileting Toileting: 0:  Activity did not occur  FIM - Diplomatic Services operational officer Devices: Grab bars Toilet Transfers: 5-To toilet/BSC: Supervision (verbal cues/safety issues);5-From toilet/BSC: Supervision (verbal cues/safety issues)  FIM - Banker Devices: Therapist, occupational: 6: Chair or W/C > Bed: No assist;6: Bed > Chair or W/C: No assist  FIM - Locomotion: Wheelchair Locomotion: Wheelchair: 0: Activity did not occur FIM - Locomotion: Ambulation Locomotion: Ambulation Assistive Devices: Dealer Ambulation/Gait Assistance: 5: Supervision Locomotion: Ambulation: 5: Travels 150 ft or more with supervision/safety issues  Comprehension Comprehension Mode: Auditory Comprehension: 5-Understands complex 90% of the time/Cues < 10% of the time  Expression Expression Mode: Verbal Expression: 5-Expresses complex 90% of the time/cues < 10% of the time  Social Interaction Social Interaction: 5-Interacts appropriately 90% of the time - Needs monitoring or encouragement for participation or interaction.  Problem Solving Problem Solving Mode: Not assessed Problem Solving: 5-Solves basic 90% of the time/requires cueing < 10% of the time  Memory Memory Mode: Not assessed Memory: 5-Recognizes or recalls 90% of the time/requires cueing < 10% of the time  Medical Problem List and Plan:  1. DVT Prophylaxis/Anticoagulation: Pharmaceutical: Lovenox  2. AV:WUJW continue ultram prn for pain management: voltaren gel  -knee xr shows intact prosthesis. No fx, effusion 3. Mood: Has poor memory with lack on insight (Dementia?) Family indicating depressed mood. Likely adjustment reaction due medical issues this year as well as limitations. psychiatry follow up.   Resumed Zoloft.  4. Neuropsych: This patient is not capable of making decisions on his own behalf.  5. CAP with RLL effusion--off abx 6. CKD: Improved with hydration. BUN/CR within normal limits 7. HTN: Will monitor with bid checks. Hydralizine added for better control on 04/27.    -recheck BMET monday 8. COPD/OSA?: Patient denies any oxygen needs.  9.  Aortic stenosis no signs of failure 10.  Polymyalgia Rheumatica- MTX 10mg  q sunday  LOS (Days) 6 A FACE TO FACE EVALUATION WAS PERFORMED  [name] 04/28/2013, 8:10 AM

## 2013-04-28 NOTE — Progress Notes (Addendum)
Occupational Therapy Session Note  Patient Details  Name: [name] MRN: [id number] Date of Birth: Sep 30, 1923  Today's Date: 04/28/2013 Time:  8:28am-0925  Time calculation: 55 min   Time: 10:19-10:43 am Time Calculation:  Skilled Therapeutic Interventions/Progress Updates:    Session #1) Pt seen today for individual OT treatment session with focus on activity tolerance & endurance for bathing & dressing ADLs at sink level while seated in w/c. Pt reported that he and his wife have assistance via caregiver at home whom will assist PRN. Pt requires Min assist LB bathing and dressing however he states that prior to this hospitalization he performed all LB ADL I'ly. He is currently supervision/min A sit to stand transfers for toileting and ADL at sink. O2 SATs 95-94% throughout on room air. BP 114/74.  Session #2) Pt seen for individual OT treatment session with focus on transfers, functional mobility, endurance in room, ADL apartment and hallway as well as UE strengthening (to self propel w/c from room to pt elevators). Pt performing transfers w/ supervision-min A. Discussed homemaking and performed reaching into cabinets w/ close supervision/SBA. Pt benefits from occasional rest breaks PRN. O2 SATS 95% on room air prior to and after session. No c/o pain per pt report.  Therapy Documentation Precautions:  Precautions Precautions: Fall Precaution Comments: monitor O2 sats on RA Restrictions Weight Bearing Restrictions: No   Pain: Pain Assessment Pain Assessment: No/denies pain Pain Score: 0-No pain       See FIM for current functional status  Therapy/Group: Individual Therapy  [name] 04/28/2013, 12:53 PM

## 2013-04-28 NOTE — Progress Notes (Signed)
Physical Therapy Session Note  Patient Details  Name: [name] MRN: [id number] Date of Birth: 1923-11-09  Today's Date: 04/28/2013 Time: 1111-1201 Time Calculation (min): 50 min   Skilled Therapeutic Interventions/Progress Updates:    This session focused on gait and functional mobility.  Gait with RW in indoor and household environments >150' at a time supervision.  Multiple seated rest breaks needed to recover DOE.  DOE 2/4 with gait, but O2 sats on RA remained stable.  Stairs with bil rails 4-6" steps 5x2 with supervision.  Bed mobility in ADL apartment supervision for safety due to pt tends to get a little close to the edge of the bed when going to sit up.  Car transfer with supervision, cues for safest technique and safety. Nu step level 4 x 10 mins working on endurance and upper and lower body strengthening.  WC mobility 100' supervision with two rest breaks needed due to fatigue at end of the session.    Therapy Documentation Precautions:  Precautions Precautions: Fall Precaution Comments: monitor O2 sats on RA Restrictions Weight Bearing Restrictions: No   Vital Signs: Therapy Vitals Temp: 98.8 F (37.1 C) Temp src: Oral Pulse Rate: 67 Resp: 18 BP: 106/72 mmHg Patient Position, if appropriate: Sitting Oxygen Therapy SpO2: 94 % O2 Device: None (Room air) Pain: Pain Assessment Pain Assessment: No/denies pain Mobility:   Locomotion : Ambulation Ambulation/Gait Assistance: 5: Supervision Wheelchair Mobility Distance: 100   See FIM for current functional status  Therapy/Group: Individual Therapy  Lurena Joiner B. Onalee Steinbach, PT, DPT [phone]   04/28/2013, 5:28 PM

## 2013-04-28 NOTE — Progress Notes (Signed)
Physical Therapy Session Note  Patient Details  Name: [name] MRN: [id number] Date of Birth: 09-Feb-1923  Today's Date: 04/28/2013 Time: 1410-1435 Time Calculation (min): 25 min   Skilled Therapeutic Interventions/Progress Updates:    Ambulation to/from gym with RW and supervision, cues for upright posture and proximity of RW. RW height adjusted for improved posture and gait mechanics. Obstacle course negotiating cones and stepping over small pole with RW, ascending/descending 5 steps with bil. Railing all performed with supervision. Cues needed for safe management of RW particularly with stepping over obstacle as pt tends to advance RW too early. Standing balance on compliant surface in parallel bars practicing head turns, eyes open/closed, and manual perturbations for increased challenge.   SpO2 > 95% during session, no c/o lightheadedness.   Therapy Documentation Precautions:  Precautions Precautions: Fall Precaution Comments: monitor O2 sats on RA Restrictions Weight Bearing Restrictions: No Pain: Pain Assessment Pain Assessment: No/denies pain  See FIM for current functional status  Therapy/Group: Individual Therapy  Wilhemina Bonito 04/28/2013, 3:30 PM

## 2013-04-28 NOTE — Progress Notes (Signed)
Patient complained of indigestion . mylanta 30cc po given . Continue with plan of care .  Cleotilde Neer

## 2013-04-29 ENCOUNTER — Encounter (HOSPITAL_COMMUNITY): Payer: Medicare Other | Admitting: Occupational Therapy

## 2013-04-29 ENCOUNTER — Inpatient Hospital Stay (HOSPITAL_COMMUNITY): Payer: Medicare Other | Admitting: Physical Therapy

## 2013-04-29 ENCOUNTER — Inpatient Hospital Stay (HOSPITAL_COMMUNITY): Payer: Medicare Other

## 2013-04-29 ENCOUNTER — Inpatient Hospital Stay (HOSPITAL_COMMUNITY): Payer: Medicare Other | Admitting: Occupational Therapy

## 2013-04-29 ENCOUNTER — Encounter: Payer: Medicare Other | Admitting: Physician Assistant

## 2013-04-29 MED ORDER — FUROSEMIDE 20 MG PO TABS
20.0000 mg | ORAL_TABLET | Freq: Every day | ORAL | Status: DC
  Filled 2013-04-29: qty 1

## 2013-04-29 NOTE — Progress Notes (Signed)
Physical Therapy Discharge Summary  Patient Details  Name: [name] MRN: [id number] Date of Birth: 1923/04/27  Today's Date: 04/29/2013 Time: 4098-1191 Time Calculation (min): 35 min  Pt ambulating x 200', 150', 100', 61' with RW and supervision. Pt not able to reach modified independent level with gait secondary to continuing to have some difficulty with keeping RW at safe distance and activity tolerance. Home environment ambulation with RW and supervision to/from bed and to/from bathroom with door negotiation. Cues needed for keeping hands on RW while ambulating as pt attempted to walk while holding door open. Bed mobility modified independent to ADL bed. Low level couch transfer with modified independence. Car transfer with supervision for safety. Stairs x 10 with bil. Railings and supervision. Discussed safety at home and increased risk for falls. Recommended avoiding uneven terrain, suggested keeping chairs close by for rest breaks as needed.   Patient has met 5 of 7 long term goals due to improved activity tolerance, improved balance, increased strength and ability to compensate for deficits.  Patient to discharge at an ambulatory level Supervision/modified independent.   Patient's care partner unable however pt has 24 hour supervision at home via a hired care giver to provide the necessary physical assistance at discharge.  Reasons goals not met: Pt continues to require supervision with gait and dynamic balance activities secondary to decreased safety with RW.   Recommendation:  Patient will benefit from ongoing skilled PT services in home health setting to continue to advance safe functional mobility, address ongoing impairments in endurance, dynamic balance, safety with RW, and to minimize fall risk.  Equipment: hospital bed  Reasons for discharge: treatment goals met and discharge from hospital  Patient/family agrees with progress made and goals achieved: Yes  PT  Discharge Precautions/Restrictions Precautions Precautions: Fall Pain Pain Assessment Pain Assessment: No/denies pain  Cognition Orientation Level: Oriented X4 Sensation Sensation Light Touch: Appears Intact Proprioception: Appears Intact Coordination Gross Motor Movements are Fluid and Coordinated: Yes  Mobility Bed Mobility Supine to Sit: 6: Modified independent (Device/Increase time) Sit to Supine: 6: Modified independent (Device/Increase time) Transfers Sit to Stand: 6: Modified independent (Device/Increase time) Stand to Sit: 6: Modified independent (Device/Increase time) Stand Pivot Transfers: 6: Modified independent (Device/Increase time) Locomotion  Ambulation Ambulation/Gait Assistance: 5: Supervision Ambulation Distance (Feet): 200 Feet Assistive device: Rolling walker Ambulation/Gait Assistance Details: Pt continues with flexed posture and tends to let RW get too far in front of self. Needs cues to avoid walking when one hand off of walker. Gait Gait: Yes Gait Pattern: Step-through pattern;Trunk flexed (decreased foot clearance bilaterally) High Level Ambulation High Level Ambulation: Direction changes Direction Changes: Pt has some difficulty keeping RW in safe range with turns, needs supervision Stairs / Additional Locomotion Stairs: Yes Stairs Assistance: 5: Supervision Stairs Assistance Details (indicate cue type and reason): supervision for safety Stair Management Technique: Two rails;Alternating pattern;Forwards Number of Stairs: 10 Wheelchair Mobility Wheelchair Mobility: No (ambulation primary means of locomotion)  Trunk/Postural Assessment  Cervical Assessment Cervical Assessment: Within Functional Limits Thoracic Assessment Thoracic Assessment: Within Functional Limits Lumbar Assessment Lumbar Assessment:  (forward flexed trunk in standing)  Balance Dynamic Standing Balance Dynamic Standing - Balance Support: During functional  activity Dynamic Standing - Level of Assistance: 5: Stand by assistance Dynamic Standing - Comments: With bathroom mobility and toileting tasks pt steady however continues to need supervision Extremity Assessment      RLE Assessment RLE Assessment: Within Functional Limits (Still with functional strength deficits) LLE Assessment LLE Assessment: Within Functional  Limits (still with functional strength deficits)  See FIM for current functional status  Wilhemina Bonito 04/29/2013, 3:50 PM

## 2013-04-29 NOTE — Progress Notes (Signed)
Patient ID: [name], male   DOB: [birth year], 79 y.o.   MRN: [id number] Subjective/Complaints: Good night. No problems this am A 12 point review of systems has been performed and if not noted above is otherwise negative.        Objective: Vital Signs: Blood pressure 110/71, pulse 73, temperature 98.2 F (36.8 C), temperature source Oral, resp. rate 19, height 5\' 8"  (1.727 m), weight 88.253 kg (194 lb 9 oz), SpO2 98.00%. No results found. Results for orders placed during the hospital encounter of 04/22/13 (from the past 72 hour(s))  GLUCOSE, CAPILLARY     Status: Abnormal   Collection Time    04/27/13  8:34 PM      Result Value Range   Glucose-Capillary 131 (*) 70 - 99 mg/dL   Comment 1 Notify RN    BASIC METABOLIC PANEL     Status: Abnormal   Collection Time    04/28/13  5:35 AM      Result Value Range   Sodium 140  135 - 145 mEq/L   Potassium 4.7  3.5 - 5.1 mEq/L   Chloride 103  96 - 112 mEq/L   CO2 27  19 - 32 mEq/L   Glucose, Bld 98  70 - 99 mg/dL   BUN 24 (*) 6 - 23 mg/dL   Creatinine, Ser 7.82 (*) 0.50 - 1.35 mg/dL   Calcium 8.7  8.4 - 95.6 mg/dL   GFR calc non Af Amer 38 (*) >90 mL/min   GFR calc Af Amer 44 (*) >90 mL/min   Comment:            The eGFR has been calculated     using the CKD EPI equation.     This calculation has not been     validated in all clinical     situations.     eGFR's persistently     <90 mL/min signify     possible Chronic Kidney Disease.     HEENT: normal Cardio: murmur and Grade 3 SEM Resp: Wheezes and unlabored GI: BS positive and non tender Extremity:  No Edema in lower ext Skin:   Other multiple ecchymotic areas on arms Neuro: awakens easily, Normal Sensory and Abnormal Motor 4/5 in BUE adn BLE Musc/Skel:  Other R knee flexion contracture, no effusion, mild tenderness with palpation. No instability Gen NAD   Assessment/Plan: 1. Functional deficits secondary to Deconditioning due to pneumonia which require 3+ hours per  day of interdisciplinary therapy in a comprehensive inpatient rehab setting. Physiatrist is providing close team supervision and 24 hour management of active medical problems listed below. Physiatrist and rehab team continue to assess barriers to discharge/monitor patient progress toward functional and medical goals. FIM: FIM - Bathing Bathing Steps Patient Completed: Chest;Right Arm;Left Arm;Abdomen;Front perineal area;Right upper leg;Left upper leg;Buttocks Bathing: 4: Min-Patient completes 8-9 32f 10 parts or 75+ percent  FIM - Upper Body Dressing/Undressing Upper body dressing/undressing steps patient completed: Thread/unthread right sleeve of pullover shirt/dresss;Thread/unthread left sleeve of pullover shirt/dress;Put head through opening of pull over shirt/dress;Pull shirt over trunk Upper body dressing/undressing: 5: Supervision: Safety issues/verbal cues FIM - Lower Body Dressing/Undressing Lower body dressing/undressing steps patient completed: Thread/unthread right underwear leg;Thread/unthread left underwear leg;Pull underwear up/down;Thread/unthread right pants leg;Thread/unthread left pants leg;Pull pants up/down;Don/Doff right shoe;Don/Doff left shoe Lower body dressing/undressing: 4: Min-Patient completed 75 plus % of tasks  FIM - Toileting Toileting steps completed by patient: Adjust clothing prior to toileting;Performs perineal hygiene;Adjust clothing after toileting Toileting Assistive  Devices: Grab bar or rail for support Toileting: 5: Supervision: Safety issues/verbal cues  FIM - Diplomatic Services operational officer Devices: Grab bars Toilet Transfers: 5-To toilet/BSC: Supervision (verbal cues/safety issues);5-From toilet/BSC: Supervision (verbal cues/safety issues)  FIM - Bed/Chair Transfer Bed/Chair Transfer Assistive Devices: Therapist, occupational: 5: Supine > Sit: Supervision (verbal cues/safety issues);5: Sit > Supine: Supervision (verbal cues/safety  issues);5: Bed > Chair or W/C: Supervision (verbal cues/safety issues);5: Chair or W/C > Bed: Supervision (verbal cues/safety issues)  FIM - Locomotion: Wheelchair Distance: 100 Locomotion: Wheelchair: 2: Travels 50 - 149 ft with supervision, cueing or coaxing FIM - Locomotion: Ambulation Locomotion: Ambulation Assistive Devices: Designer, industrial/product Ambulation/Gait Assistance: 5: Supervision Locomotion: Ambulation: 5: Travels 150 ft or more with supervision/safety issues  Comprehension Comprehension Mode: Auditory Comprehension: 5-Understands complex 90% of the time/Cues < 10% of the time  Expression Expression Mode: Verbal Expression: 5-Expresses basic 90% of the time/requires cueing < 10% of the time.  Social Interaction Social Interaction: 5-Interacts appropriately 90% of the time - Needs monitoring or encouragement for participation or interaction.  Problem Solving Problem Solving Mode: Not assessed Problem Solving: 5-Solves basic 90% of the time/requires cueing < 10% of the time  Memory Memory Mode: Not assessed Memory: 5-Recognizes or recalls 90% of the time/requires cueing < 10% of the time  Medical Problem List and Plan:  1. DVT Prophylaxis/Anticoagulation: Pharmaceutical: Lovenox  2. QM:VHQI continue ultram prn for pain management: voltaren gel  -knee xr shows intact prosthesis. No fx, effusion 3. Mood: Has poor memory with lack on insight (Dementia?) Family indicating depressed mood. Likely adjustment reaction due medical issues this year as well as limitations. psychiatry follow up.   Resumed Zoloft.  4. Neuropsych: This patient is not capable of making decisions on his own behalf.  5. CAP with RLL effusion--off abx 6. CKD: Improved with hydration. BUN/CR within normal limits 7. HTN: Will monitor with bid checks. Hydralizine added for better control on 04/27.       8. COPD/OSA?: Patient denies any oxygen needs.  9.  Aortic stenosis no signs of failure 10.  Polymyalgia  Rheumatica- MTX 10mg  q sunday  LOS (Days) 7 A FACE TO FACE EVALUATION WAS PERFORMED  [name] T 04/29/2013, 7:42 AM

## 2013-04-29 NOTE — Progress Notes (Signed)
Physical Therapy Note  Patient Details  Name: BRYNDAN BILYK MRN: [id number] Date of Birth: Sep 10, 1923 Today's Date: 04/29/2013  1:00 - 1:55 55 minutes Ambulation group session Patient denies pain.  Patient ambulated 150+ feet with RW x 4 with supervision. Patient ambulated around obstacles, stepping over thresholds, and on foam mat with close supervision. Patient ambulated up and down 10 steps with bilateral rails and supervision. Patient exercised on Nustep x 10 minutes on level 4 for strength and endurance.   [name] M 04/29/2013, 3:08 PM

## 2013-04-29 NOTE — Plan of Care (Signed)
Problem: RH Dressing Goal: LTG Patient will perform upper body dressing (OT) LTG Patient will perform upper body dressing with assist, with/without cues (OT).  Outcome: Completed/Met Date Met:  04/29/13 Pt is Mod I UB dressing Goal: LTG Patient will perform lower body dressing w/assist (OT) LTG: Patient will perform lower body dressing with assist, with/without cues in positioning using equipment (OT)  Outcome: Adequate for Discharge Pt occasionally needs assist w/ socks/shoes, generally supervision level. If pt d/c's home w/ TED hose, he will need min assist donning and doffing them.  Problem: RH Toileting Goal: LTG Patient will perform toileting w/assist, cues/equip (OT) LTG: Patient will perform toiletiing (clothes management/hygiene) with assist, with/without cues using equipment (OT)  Outcome: Completed/Met Date Met:  04/29/13 Pt amb w/ RW and uses grab bar for support. Pt reports that he has grab bar at home, OT recommends 3:1 to place over toilet for safety w/ transfers.  Problem: RH Toilet Transfers Goal: LTG Patient will perform toilet transfers w/assist (OT) LTG: Patient will perform toilet transfers with assist, with/without cues using equipment (OT)  Outcome: Completed/Met Date Met:  04/29/13 Using grab bar and RW. Recommend 3:1 for home use, over toilet.

## 2013-04-29 NOTE — Discharge Summary (Signed)
Physician Discharge Summary  Patient ID: [name] MRN: [id number] DOB/AGE: September 23, 1923 79 y.o.  Admit date: 04/22/2013 Discharge date: 04/30/2013  Discharge Diagnoses:  Principal Problem:   Physical deconditioning Active Problems:   DEPRESSIVE DISORDER NOT ELSEWHERE CLASSIFIED   HYPERTENSION, BENIGN   DM (diabetes mellitus)   PNA (pneumonia)   Pleural effusion   Discharged Condition: Improved.  Significant Diagnostic Studies:  Labs:  Basic Metabolic Panel:  Recent Labs Lab 04/28/13 0535 04/29/13 0615 04/30/13 0545  NA 140  --  137  K 4.7  --  4.7  CL 103  --  103  CO2 27  --  27  GLUCOSE 98  --  93  BUN 24*  --  25*  CREATININE 1.57* 1.46* 1.43*  CALCIUM 8.7  --  8.5    CBC: No results found for this basename: WBC, NEUTROABS, HGB, HCT, MCV, PLT,  in the last 168 hours  CBG:  Recent Labs Lab 04/27/13 2034  GLUCAP 131*    Brief HPI:   [name] is a 79 y.o. male with history of DM, COPD--non compliant with O2?, OSA, PMR, who was admitted on 04/16/13 past falls with one week history of chills and weakness. He was noted to be hypoxic, hypercarbic and febrile at admission. He was started on IV antibiotics for PNA and underwent right thoracocentesis of 1.1 Liter of clear liquid on 04/24 by IVR. PT evaluation revealed patient to be deconditioned and recommended CIR. After rehab consultation pt was ultimately admitted to CIR on 04/29.   Hospital Course: [name] was admitted to rehab 04/22/2013 for inpatient therapies to consist of PT, ST and OT at least three hours five days a week. Past admission physiatrist, therapy team and rehab RN have worked together to provide customized collaborative inpatient rehab. He was weaned off oxygen during his stay. Day past admission patient was noted to be severely orthostatic with complaints of dizziness due to  BP dropping as low as 72/41. His hydralazine was discontinued and patient was encouraged to push po fluids.  Blood pressures have been monitored on bid basis and has been well controlled without recurrent symptoms.  He has shown good motivation and no signs of depressed mood noted.   Po intake has been good and he has been continent of bowel and bladder. Labs done revealed upward trend in BUN therefore, lasix was discontinued on 05/06. He is to follow up with Dr. Jonny Ruiz on Friday for post hospital follow up with check of lytes and decision regarding resumption of diuretic. His mentation has improved greatly. He had had complaints of right knee pain and xrays done show no evidence of loosening or failure of hardware. Voltaren gel was added and has been greatly effective in pain management.  He was weaned off oxygen and is tolerating activity without signs/ symptoms of hypoxia.  Rehab course: During patient's stay in rehab weekly team conferences were held to monitor patient's progress, set goals and discuss barriers to discharge. Occupational therapy has worked on ADL tasks. Patient requires supervision for bathing and dressing tasks.  His wife has a live in care attendant who will assist as needed with these tasks.  Physical therapy has worked with patient on activity tolerance, balance, strength and as well ability to compensate for deficits. He is independent for transfers.  He is able to ambulate 80-200 feet with supervision and use of RW. He continues to have difficulty keeping walker at safe distance therefore supervision is recommended past  discharge.    Disposition: 01-Home or Self Care  Diet:  Heart healthy. Low salt.   Special Instructions: Advance home care to provide [facility] and physical therapy.        Future Appointments Provider Department Dept Phone   05/02/2013 1:30 PM [name], MD [facility] Primary Care -[place] [phone]   05/15/2013 12:10 PM Beatrice Lecher, PA-C [facility] Main Office [place]) [phone]   05/16/2013 9:40 AM Ranelle Oyster, MD [facility] Physical  Medicine and Rehabilitation [phone]   06/04/2013 2:30 PM [name], MD [facility] Primary Care -[place] [phone]       Medication List    STOP taking these medications       cyclobenzaprine 10 MG tablet  Commonly known as:  FLEXERIL     furosemide 20 MG tablet  Commonly known as:  LASIX     gabapentin 300 MG capsule  Commonly known as:  NEURONTIN     hydrOXYzine 10 MG tablet  Commonly known as:  ATARAX/VISTARIL     solifenacin 5 MG tablet  Commonly known as:  VESICARE      TAKE these medications       albuterol 108 (90 BASE) MCG/ACT inhaler  Commonly known as:  PROVENTIL HFA;VENTOLIN HFA  Inhale 2 puffs into the lungs every 6 (six) hours as needed for wheezing or shortness of breath.     cholecalciferol 1000 UNITS tablet  Commonly known as:  VITAMIN D  Take 1,000 Units by mouth daily.     clopidogrel 75 MG tablet  Commonly known as:  PLAVIX  Take 1 tablet (75 mg total) by mouth daily.     cyanocobalamin 1000 MCG/ML injection  Commonly known as:  (VITAMIN B-12)  1 ml IM q 2 weeks     diclofenac sodium 1 % Gel  Commonly known as:  VOLTAREN  Apply 2 g topically 4 (four) times daily.     diltiazem 180 MG 24 hr capsule  Commonly known as:  CARDIZEM CD  Take 1 capsule (180 mg total) by mouth daily.     DSS 100 MG Caps  Take 100 mg by mouth 2 (two) times daily.     famotidine 20 MG tablet  Commonly known as:  PEPCID  Take 1 tablet (20 mg total) by mouth 2 (two) times daily.     folic acid 1 MG tablet  Commonly known as:  FOLVITE  Take 1 mg by mouth daily.     hyoscyamine 0.125 MG tablet  Commonly known as:  LEVSIN, ANASPAZ  Take 0.125 mg by mouth 4 (four) times daily as needed. Gas pain.     levothyroxine 137 MCG tablet  Commonly known as:  SYNTHROID, LEVOTHROID  Take 1 tablet (137 mcg total) by mouth daily.     methotrexate 2.5 MG tablet  Commonly known as:  RHEUMATREX  Take 10 mg by mouth once a week. Takes 4 tablets weekly on   Sunday.   Caution:Chemotherapy. Protect from light.     pravastatin 40 MG tablet  Commonly known as:  PRAVACHOL  TAKE ONE TABLET BY MOUTH EVERY DAY     sertraline 50 MG tablet  Commonly known as:  ZOLOFT  Take 1 tablet (50 mg total) by mouth daily.     traMADol 50 MG tablet  Commonly known as:  ULTRAM  Take 1 tablet (50 mg total) by mouth every 6 (six) hours as needed for pain.     traZODone 100 MG tablet  Commonly known as:  DESYREL  Take 0.5 tablets (50 mg total) by mouth at bedtime.     vitamin C 500 MG tablet  Commonly known as:  ASCORBIC ACID  Take 500 mg by mouth daily.       Follow-up Information   Follow up with Ranelle Oyster, MD On 05/16/2013. (Be there at 9:30 for 9:40 appointment)    Contact information:   [address], [address] [place] [address] [phone]       Follow up with [name], MD On 05/02/2013. (Be there at 1:30 pm for post hospital follow up and check of lytes. Input on resuming Lasix)    Contact information:   [address] [place] [address] [phone]       Signed: Jacquelynn Cree 04/30/2013, 4:59 PM

## 2013-04-29 NOTE — Progress Notes (Signed)
Occupational Therapy Session Note  Patient Details  Name: [name] MRN: [id number] Date of Birth: [birth year]  Today's Date: 04/29/2013 Time: 0832-0926 Time Calculation (min): 54 min  Time:10:16-10:58am Time calculation (Min): 42 min  Skilled Therapeutic Interventions/Progress Updates:   Session #1) Pt seen for individual OT treatment session with focus on  focus on endurance, activity tolerance, transfers, & standing balance for bathing & dressing ADLs at sink level. Pt was supervision level to gather clothing this am. He was supervision for LB dressing and bathing, he performed sit to stand transfers at the sink and stood for peri care at supervision-mod I level. Toilet transfers were supervision-mod I using RW & grab bar at comfort height toilet. O2 SATs 98-95% on room air throughout. BP at end of session 108/54, RN made aware. Pt w/o c/o pain.  Session #2) Pt seen for individual OT treatment session with focus on transfers, functional mobility, endurance, ADL/tub transfers. Pt ambulated from room to ADL apartment and practiced transfers to bed, couch and tub using tub bench (Pt reports that he has grab bars and tub seat w/ back at home w/ hand held shower, shower door). Pt  Performed tub transfer w/ supervision assist and vc's for safety. Pt benefits from rest breaks PRN. Discussed homemaking & ADL's after d/c home and recommend 3:1 for pt to use over commode as well as supervision level transfers into/out of tub, pt verbalized understanding of this. No family was present for education session today. O2 SATS 95-97% on room air throughout session.  No c/o pain per pt report.   Therapy Documentation Precautions:  Precautions Precautions: Fall Precaution Comments: monitor O2 sats on RA Restrictions Weight Bearing Restrictions: No           See FIM for current functional status  Therapy/Group: Individual Therapy  [name] 04/29/2013, 12:38 PM

## 2013-04-29 NOTE — Progress Notes (Signed)
Occupational Therapy Discharge Summary  Patient Details  Name: [name] MRN: [id number] Date of Birth: 04/26/1923  Today's Date: 04/29/2013 Time: 8:32-9:26am Time Calculation (min): 54 min Time: 7829-5621 Time Calculation (min): 42 min  Patient has met 9 of 9 long term goals due to improved activity tolerance and improved balance.  Patient to discharge at overall Supervision level.  Patient reports that he has has live in care attendant whom will assist w/ ADL's and home making PRN, and she will be able to provide the necessary physical and cognitive assistance at discharge.    Reasons goals not met: Pt will continue to need assistance with tub/shower transfers and toilet transfers at supervision level. Per Pt reports, he was min assist for homemaking and simple meal prep prior to arrival and will cont to require min assist in these areas from live in care attendant (whom also cares for pt's wife with dementia).  Recommendation:  Patient will benefit from ongoing skilled OT services in home health setting to continue to advance functional skills in the area of BADL and iADL.  Equipment: Recommend 3:1 for use at home over toilet for increased safety. Toilet transfers are supervision level. Pt reports that he has shower seat w/ back, hand held shower head and grab bars around toilet and in tub. Note, no family was present to verify DME.  Reasons for discharge: discharge from hospital  Patient/family agrees with progress made and goals achieved: Yes  OT Discharge Precautions/Restrictions  Restrictions Weight Bearing Restrictions: No   Vital Signs Therapy Vitals Pulse Rate: 78 BP: 102/68 mmHg Patient Position, if appropriate: Sitting        Cognition Orientation Level: Oriented X4        Please refer to therapy notes from 04/29/13 for most recent therapy session(s), pt status and pt education provided.          See FIM for current functional status  [name] 04/29/2013, 12:56 PM

## 2013-04-30 LAB — BASIC METABOLIC PANEL
CO2: 27 mEq/L (ref 19–32)
Calcium: 8.5 mg/dL (ref 8.4–10.5)
Glucose, Bld: 93 mg/dL (ref 70–99)
Sodium: 137 mEq/L (ref 135–145)

## 2013-04-30 MED ORDER — DICLOFENAC SODIUM 1 % TD GEL: 2.0000 g | Freq: Four times a day (QID) | TRANSDERMAL | Status: DC

## 2013-04-30 MED ORDER — FAMOTIDINE 20 MG PO TABS: 20.0000 mg | ORAL_TABLET | Freq: Two times a day (BID) | ORAL | Status: DC

## 2013-04-30 MED ORDER — TRAZODONE HCL 100 MG PO TABS: 50.0000 mg | ORAL_TABLET | Freq: Every day | ORAL | Status: DC

## 2013-04-30 NOTE — Patient Care Conference (Signed)
Inpatient RehabilitationTeam Conference and Plan of Care Update Date: 04/29/2013   Time: 2:50 PM    Patient Name: [name]      Medical Record Number: [id number]  Date of Birth: Apr 24, 1923 Sex: Male         Room/Bed: 4004/4004-01 Payor Info: Payor: MEDICARE  Plan: MEDICARE PART A AND B  Product Type: *No Product type*     Admitting Diagnosis: DECONDITION  PNA  Admit Date/Time:  04/22/2013  3:32 PM Admission Comments: No comment available   Primary Diagnosis:  Physical deconditioning Principal Problem: Physical deconditioning  Patient Active Problem List   Diagnosis Date Noted  . Physical deconditioning 04/22/2013  . PNA (pneumonia) 04/17/2013  . Pleural effusion 04/17/2013  . Syncope 12/24/2012  . Headache 12/05/2012  . General weakness 12/05/2012  . Fall 12/05/2012  . Ulcer 11/20/2012  . Acute kidney failure 11/20/2012  . Hemoptysis 11/19/2012  . Aphthous ulcer 11/19/2012  . Pneumothorax, right 11/19/2012  . PAF (paroxysmal atrial fibrillation) 11/19/2012  . History of CVA (cerebrovascular accident) 11/19/2012  . PMR (polymyalgia rheumatica) 05/29/2012  . History of MI (myocardial infarction) 05/29/2012  . Hypogonadism male 05/29/2012  . Anxiety 05/29/2012  . Hyperparathyroidism 05/29/2012  . TIA (transient ischemic attack) 05/29/2012  . CKD (chronic kidney disease) stage 4, GFR 15-29 ml/min 05/29/2012  . COPD (chronic obstructive pulmonary disease) 05/29/2012  . OSA (obstructive sleep apnea) 05/29/2012  . Gait disorder 05/29/2012  . Chronic steroid use 05/29/2012  . Depression   . Preventative health care 05/18/2012  . AS (aortic stenosis)   . AF (paroxysmal atrial fibrillation)   . DM (diabetes mellitus)   . Shingles   . Left carotid stenosis   . Osteoarthritis   . Nephrolithiasis   . Obesity   . Hx of colonoscopy   . HYPERTENSION, BENIGN 04/07/2010  . AORTIC STENOSIS/ INSUFFICIENCY, NON-RHEUMATIC 04/05/2009  . LEG CRAMPS, NOCTURNAL 10/07/2008  .  HYPERKALEMIA 10/01/2008  . RENAL FAILURE 10/01/2008  . DERMATITIS, FACE 10/01/2008  . SLEEP APNEA, OBSTRUCTIVE 09/28/2008  . CONTACT DERMATITIS&OTHER ECZEMA DUE TO SUNBURN 07/28/2008  . DEPRESSIVE DISORDER NOT ELSEWHERE CLASSIFIED 07/24/2008  . HYPOKALEMIA 07/21/2008  . RECTAL BLEEDING 07/21/2008  . Edema 07/08/2008  . Atrial flutter 06/25/2008  . Pernicious anemia 06/23/2008  . FATIGUE 06/23/2008  . PROSTATE CANCER, HX OF 06/20/2008  . VITAMIN B12 DEFICIENCY 06/17/2008  . BACK PAIN 06/15/2008  . LIPOMA 04/30/2008  . HYPOTHYROIDISM 04/21/2008  . HYPERLIPIDEMIA 04/21/2008  . ANEMIA 04/21/2008  . CORONARY ARTERY DISEASE 04/21/2008  . ALLERGIC RHINITIS 04/21/2008  . BURSITIS 04/21/2008  . PLANTAR FASCIITIS 04/21/2008  . UNS ADVRS EFF OTH RX MEDICINAL&BIOLOGICAL SBSTNC 04/21/2008    Expected Discharge Date:  04/30/13  Team Members Present: MD: Dr Faith Rogue  RN: Ethelene Browns, Bayard Hugger  CSW: Burley Saver, Sherrine Maples  OT: Edwin Cap, Ardis Rowan, Mackie Pai  SLP: Feliberto Gottron  Tora Duck, RN, PPS Coordinator  Lutricia Horsfall, [facility]         Current Status/Progress Goal Weekly Team Focus  Medical   admitted for deconditioning after pneumonia. hx of PMR, ckd, mild dementia  manage medical issues to allow participation in rehaab  diet, pain mgt, balancing i's and o's, mood support   Bowel/Bladder   continent of bowel and bladder LBM 04/29/03 independent with urinal/requests condom catheter at hs  min assist      Swallow/Nutrition/ Hydration             ADL's  Mobility   supervision for gait with RW, supervision for bed mobility and transfers.  DOE with gait 2/4, however O2 sats remain stable on RA.  Pt wearing abdominal binder to help with BP control.    supervision for car transfers, stairs, mod I for gait transfers, bed mobility  endurance, strength training, mobility, BP regulation, balance.      Communication             Safety/Cognition/ Behavioral Observations  no unsafe behavior         Pain   no complaints of pain         Skin   no skin breakdown          Rehab Goals Patient on target to meet rehab goals: Yes *See Care Plan and progress notes for long and short-term goals.  Barriers to Discharge: mild memory deficits, fatigue    Possible Resolutions to Barriers:  ?arrange supervision at home at least initially    Discharge Planning/Teaching Needs:  home with private caretaker providing supervision      Team Discussion:  Good progress. Family has hired caregivers. Plan to d/c 5/7  Revisions to Treatment Plan:  none   Continued Need for Acute Rehabilitation Level of Care: The patient requires daily medical management by a physician with specialized training in physical medicine and rehabilitation for the following conditions: Daily direction of a multidisciplinary physical rehabilitation program to ensure safe treatment while eliciting the highest outcome that is of practical value to the patient.: Yes Daily medical management of patient stability for increased activity during participation in an intensive rehabilitation regime.: Yes Daily analysis of laboratory values and/or radiology reports with any subsequent need for medication adjustment of medical intervention for : Pulmonary problems;Neurological problems  [name] 04/30/2013, 2:05 PM

## 2013-04-30 NOTE — Progress Notes (Signed)
Patient ID: [name], male   DOB: Dec 11, 1923, 79 y.o.   MRN: [id number] Subjective/Complaints: No complaints. Ready to go home. A 12 point review of systems has been performed and if not noted above is otherwise negative.        Objective: Vital Signs: Blood pressure 135/80, pulse 77, temperature 98.4 F (36.9 C), temperature source Oral, resp. rate 19, height 5\' 8"  (1.727 m), weight 88.253 kg (194 lb 9 oz), SpO2 91.00%. No results found. Results for orders placed during the hospital encounter of 04/22/13 (from the past 72 hour(s))  GLUCOSE, CAPILLARY     Status: Abnormal   Collection Time    04/27/13  8:34 PM      Result Value Range   Glucose-Capillary 131 (*) 70 - 99 mg/dL   Comment 1 Notify RN    BASIC METABOLIC PANEL     Status: Abnormal   Collection Time    04/28/13  5:35 AM      Result Value Range   Sodium 140  135 - 145 mEq/L   Potassium 4.7  3.5 - 5.1 mEq/L   Chloride 103  96 - 112 mEq/L   CO2 27  19 - 32 mEq/L   Glucose, Bld 98  70 - 99 mg/dL   BUN 24 (*) 6 - 23 mg/dL   Creatinine, Ser 4.09 (*) 0.50 - 1.35 mg/dL   Calcium 8.7  8.4 - 81.1 mg/dL   GFR calc non Af Amer 38 (*) >90 mL/min   GFR calc Af Amer 44 (*) >90 mL/min   Comment:            The eGFR has been calculated     using the CKD EPI equation.     This calculation has not been     validated in all clinical     situations.     eGFR's persistently     <90 mL/min signify     possible Chronic Kidney Disease.  CREATININE, SERUM     Status: Abnormal   Collection Time    04/29/13  6:15 AM      Result Value Range   Creatinine, Ser 1.46 (*) 0.50 - 1.35 mg/dL   GFR calc non Af Amer 41 (*) >90 mL/min   GFR calc Af Amer 48 (*) >90 mL/min   Comment:            The eGFR has been calculated     using the CKD EPI equation.     This calculation has not been     validated in all clinical     situations.     eGFR's persistently     <90 mL/min signify     possible Chronic Kidney Disease.  BASIC METABOLIC  PANEL     Status: Abnormal   Collection Time    04/30/13  5:45 AM      Result Value Range   Sodium 137  135 - 145 mEq/L   Potassium 4.7  3.5 - 5.1 mEq/L   Chloride 103  96 - 112 mEq/L   CO2 27  19 - 32 mEq/L   Glucose, Bld 93  70 - 99 mg/dL   BUN 25 (*) 6 - 23 mg/dL   Creatinine, Ser 9.14 (*) 0.50 - 1.35 mg/dL   Calcium 8.5  8.4 - 78.2 mg/dL   GFR calc non Af Amer 42 (*) >90 mL/min   GFR calc Af Amer 49 (*) >90 mL/min   Comment:  The eGFR has been calculated     using the CKD EPI equation.     This calculation has not been     validated in all clinical     situations.     eGFR's persistently     <90 mL/min signify     possible Chronic Kidney Disease.     HEENT: normal Cardio: murmur and Grade 3 SEM Resp: Wheezes and unlabored GI: BS positive and non tender Extremity:  No Edema in lower ext Skin:   Other multiple ecchymotic areas on arms Neuro: awakens easily, Normal Sensory and Abnormal Motor 4/5 in BUE adn BLE Musc/Skel:  Other R knee flexion contracture, no effusion, mild tenderness with palpation. No instability Gen NAD   Assessment/Plan: 1. Functional deficits secondary to Deconditioning due to pneumonia which require 3+ hours per day of interdisciplinary therapy in a comprehensive inpatient rehab setting. Physiatrist is providing close team supervision and 24 hour management of active medical problems listed below. Physiatrist and rehab team continue to assess barriers to discharge/monitor patient progress toward functional and medical goals.  Home today   FIM: FIM - Bathing Bathing Steps Patient Completed: Chest;Right Arm;Left Arm;Abdomen;Front perineal area;Buttocks;Right upper leg;Left upper leg;Right lower leg (including foot);Left lower leg (including foot) Bathing: 5: Supervision: Safety issues/verbal cues  FIM - Upper Body Dressing/Undressing Upper body dressing/undressing steps patient completed: Thread/unthread right sleeve of pullover  shirt/dresss;Thread/unthread left sleeve of pullover shirt/dress;Put head through opening of pull over shirt/dress;Pull shirt over trunk Upper body dressing/undressing: 5: Supervision: Safety issues/verbal cues FIM - Lower Body Dressing/Undressing Lower body dressing/undressing steps patient completed: Thread/unthread right underwear leg;Thread/unthread left underwear leg;Pull underwear up/down;Thread/unthread right pants leg;Thread/unthread left pants leg;Pull pants up/down;Don/Doff right shoe;Don/Doff left shoe (Pt required assist with TED hose) Lower body dressing/undressing: 5: Supervision: Safety issues/verbal cues  FIM - Toileting Toileting steps completed by patient: Adjust clothing prior to toileting;Performs perineal hygiene;Adjust clothing after toileting Toileting Assistive Devices: Grab bar or rail for support Toileting: 5: Supervision: Safety issues/verbal cues  FIM - Diplomatic Services operational officer Devices: Grab bars;Walker Toilet Transfers: 5-To toilet/BSC: Supervision (verbal cues/safety issues);5-From toilet/BSC: Supervision (verbal cues/safety issues)  FIM - Banker Devices: Therapist, occupational: 6: Chair or W/C > Bed: No assist  FIM - Locomotion: Wheelchair Distance: 100 Locomotion: Wheelchair: 0: Activity did not occur (not primary means of ambulation) FIM - Locomotion: Ambulation Locomotion: Ambulation Assistive Devices: Designer, industrial/product Ambulation/Gait Assistance: 5: Supervision Locomotion: Ambulation: 5: Travels 150 ft or more with supervision/safety issues  Comprehension Comprehension Mode: Auditory Comprehension: 5-Understands complex 90% of the time/Cues < 10% of the time  Expression Expression Mode: Verbal Expression: 5-Expresses complex 90% of the time/cues < 10% of the time  Social Interaction Social Interaction: 6-Interacts appropriately with others with medication or extra time (anti-anxiety,  antidepressant).  Problem Solving Problem Solving Mode: Not assessed Problem Solving: 5-Solves complex 90% of the time/cues < 10% of the time  Memory Memory Mode: Not assessed Memory: 5-Recognizes or recalls 90% of the time/requires cueing < 10% of the time  Medical Problem List and Plan:  1. DVT Prophylaxis/Anticoagulation: Pharmaceutical: Lovenox  2. OZ:HYQM continue ultram prn for pain management: voltaren gel  -knee xr shows intact prosthesis. No fx, effusion 3. Mood: Has poor memory with lack on insight (Dementia?) Family indicating depressed mood. Likely adjustment reaction due medical issues this year as well as limitations. psychiatry follow up.   Resumed Zoloft.  4. Neuropsych: This patient is not capable of making decisions  on his own behalf.  5. CAP with RLL effusion--off abx 6. CKD: Improved with hydration. BUN/CR with some improvement today    hold lasix-  -recheck as an outpt on friday 7. HTN: Will monitor with bid checks. Hydralizine added for better control on 04/27.       8. COPD/OSA?: Patient denies any oxygen needs.  9.  Aortic stenosis no signs of failure 10.  Polymyalgia Rheumatica- MTX 10mg  q sunday  LOS (Days) 8 A FACE TO FACE EVALUATION WAS PERFORMED  Madysen Faircloth T 04/30/2013, 7:40 AM

## 2013-04-30 NOTE — Progress Notes (Signed)
Pt discharged home with friend, Marissa Nestle PA provided discharge instructions, pt wheeled to private vehicle, pt verbalized an understanding and denied any questions or concerns

## 2013-04-30 NOTE — Progress Notes (Signed)
Social Work  Discharge Note  The overall goal for the admission was met for:   Discharge location: Yes - home with wife (dementia) and 24/7 private duty caregiver  Length of Stay: Yes - 8 days  Discharge activity level: Yes - supervision  Home/community participation: Yes  Services provided included: MD, RD, PT, OT, RN, TR, Pharmacy and SW  Financial Services: Medicare and Private Insurance: [employer] [place]  Follow-up services arranged: Home Health: RN, PT via [facility], DME: hospital bed via [facility] and Patient/Family has no preference for HH/DME agencies  Comments (or additional information):  Patient/Family verbalized understanding of follow-up arrangements: Yes  Individual responsible for coordination of the follow-up plan: patient  Confirmed correct DME delivered: [name] 04/30/2013    [name]

## 2013-05-02 ENCOUNTER — Ambulatory Visit (INDEPENDENT_AMBULATORY_CARE_PROVIDER_SITE_OTHER)
Admission: RE | Admit: 2013-05-02 | Discharge: 2013-05-02 | Disposition: A | Discharge status: Home / Self Care | Payer: Medicare Other | Source: Ambulatory Visit | Attending: Internal Medicine | Admitting: Internal Medicine

## 2013-05-02 ENCOUNTER — Encounter: Payer: Self-pay | Admitting: Internal Medicine

## 2013-05-02 ENCOUNTER — Other Ambulatory Visit (INDEPENDENT_AMBULATORY_CARE_PROVIDER_SITE_OTHER): Payer: Medicare Other

## 2013-05-02 ENCOUNTER — Ambulatory Visit (INDEPENDENT_AMBULATORY_CARE_PROVIDER_SITE_OTHER): Payer: Medicare Other | Admitting: Internal Medicine

## 2013-05-02 VITALS — BP 92/60 | HR 86 | Temp 97.3°F | Wt 193.0 lb

## 2013-05-02 DIAGNOSIS — N184 Chronic kidney disease, stage 4 (severe): Secondary | ICD-10-CM

## 2013-05-02 DIAGNOSIS — J90 Pleural effusion, not elsewhere classified: Secondary | ICD-10-CM

## 2013-05-02 DIAGNOSIS — M25511 Pain in right shoulder: Secondary | ICD-10-CM | POA: Insufficient documentation

## 2013-05-02 DIAGNOSIS — R32 Unspecified urinary incontinence: Secondary | ICD-10-CM

## 2013-05-02 DIAGNOSIS — J189 Pneumonia, unspecified organism: Secondary | ICD-10-CM

## 2013-05-02 DIAGNOSIS — M25519 Pain in unspecified shoulder: Secondary | ICD-10-CM

## 2013-05-02 DIAGNOSIS — E538 Deficiency of other specified B group vitamins: Secondary | ICD-10-CM

## 2013-05-02 DIAGNOSIS — R351 Nocturia: Secondary | ICD-10-CM

## 2013-05-02 DIAGNOSIS — F329 Major depressive disorder, single episode, unspecified: Secondary | ICD-10-CM

## 2013-05-02 DIAGNOSIS — F3289 Other specified depressive episodes: Secondary | ICD-10-CM

## 2013-05-02 LAB — BASIC METABOLIC PANEL
BUN: 21 mg/dL (ref 6–23)
Chloride: 103 mEq/L (ref 96–112)
Glucose, Bld: 90 mg/dL (ref 70–99)
Potassium: 4.4 mEq/L (ref 3.5–5.1)

## 2013-05-02 LAB — URINALYSIS, ROUTINE W REFLEX MICROSCOPIC
Bilirubin Urine: NEGATIVE
Ketones, ur: NEGATIVE
Nitrite: NEGATIVE
Specific Gravity, Urine: 1.025 (ref 1.000–1.030)
Total Protein, Urine: NEGATIVE
pH: 6 (ref 5.0–8.0)

## 2013-05-02 MED ORDER — CYANOCOBALAMIN 1000 MCG/ML IJ SOLN
1000.0000 ug | Freq: Once | INTRAMUSCULAR | Status: AC
  Administered 2013-05-02: 1000 ug via INTRAMUSCULAR

## 2013-05-02 MED ORDER — SERTRALINE HCL 100 MG PO TABS: 100.0000 mg | ORAL_TABLET | Freq: Every day | ORAL | Status: DC

## 2013-05-02 MED ORDER — TRAMADOL HCL 50 MG PO TABS: 50.0000 mg | ORAL_TABLET | Freq: Four times a day (QID) | ORAL | Status: DC | PRN

## 2013-05-02 NOTE — Progress Notes (Signed)
Subjective:    Patient ID: [name], male    DOB: May 18, 1923, 79 y.o.   MRN: [id number]  HPI  Here to fu with family after recent hospn that began with calling EMS after severe weakness and falling out of bed; No apparent injury but tx for CAP right/acute resp failure with pleural effusion (clear and drained 1.1 L).  Home now from rehab stay since may 6.  Several meds stopped including flexeril, vesicare, lasix 20, gabapentin, atarax, also currently out of tramadol, tolerated ok prior.  Has hx of DM, COPD/o2, PMR, renal insuff, depression, PAF, CVA, PVD, AS, OSA, anemia, b12 def and chronic deconditioning/ongoing pain.  Due for B12 IM today.  Wife and pt states overall doing ok since being at home without fever, cough, CP, worsening sob/doe, falls.  He c/o pain to bilat shoulders chronic, due for b12 IM today, Pt denies wheezing, orthopnea, PND, increased LE swelling, palpitations, dizziness or syncope. Orthostatics done today without dizzy on standing.Has had new nocturia/urinary incontinence off the vesicare but Denies urinary symptoms such as dysuria, frequency, urgency, flank pain, hematuria.  Also whole situation quite stressful, with worsening depressive symptoms though has remained able to participate in PT, and in fact today is able to stand relatively quickly with small pushoff from chair, walk with walker the few steps to the exam table and step up to sit on table with mild assist only.  No other new complaints Past Medical History  Diagnosis Date  . CAD (coronary artery disease)     s/p NSTEMI and BMS stent diagonal07/09;  Lexiscan Myoview 9/13:  EF 62%, no ischemia  . AS (aortic stenosis)     a.  Echo 2009 mean gradient 9mm HG. AVA 2.18;  b.  Echo 4/11 mild AS mean gradient 10mm HG;  c.  Echo 04/2011: Mild LVH, EF 55-60%, grade 1 diastolic dysfunction, mild aortic stenosis, mean gradient 14, mild LAE;  d. Echo 9/13: mod LVH, EF 50-55%, mild to mod AS, mean gradient 17 mmHg  . AF  (paroxysmal atrial fibrillation)     Holter monitor 2.5 sec pauses  . Fatigue     CPX 11/09 VO2  14.4 (75% predicte)d, slope 35.  O2 pulse normal.  VO2 corrected for body weight 17.6.  Marland Kitchen Hypothyroidism   . Hyperlipidemia   . History of prostate cancer   . Allergic rhinitis   . DM (diabetes mellitus)     type II diet controlled  . Shingles   . Left carotid stenosis   . Vitamin B 12 deficiency   . Anemia   . Osteoarthritis   . Nephrolithiasis   . Obesity   . OSA (obstructive sleep apnea)     AHI 15 PSG 09/06/08  . Hx of colonoscopy   . Stroke   . Anxiety and depression   . PMR (polymyalgia rheumatica) 05/29/2012  . Hypogonadism male 05/29/2012  . Hyperparathyroidism 05/29/2012  . TIA (transient ischemic attack) 05/29/2012  . CKD (chronic kidney disease) stage 4, GFR 15-29 ml/min 05/29/2012  . COPD (chronic obstructive pulmonary disease) 05/29/2012  . Myocardial infarction   . Cancer     prostate ca history   Past Surgical History  Procedure Laterality Date  . Appendectomy    . Hernia repair    . Right carpal tunnel release    . Lumbar spine surgery    . Cystectomy      neck  . Prostatectomy    . Vasectomy    . Total knee  arthroplasty      right  . Cholecystectomy      reports that he quit smoking about 54 years ago. His smoking use included Cigarettes. He has a 12.5 pack-year smoking history. He has quit using smokeless tobacco. He reports that he does not drink alcohol or use illicit drugs. family history includes Alcohol abuse (age of onset: 77) in his father; Coronary artery disease in an unspecified family member; and Stroke (age of onset: 3) in his mother. Allergies  Allergen Reactions  . Bactrim (Sulfamethoxazole W-Trimethoprim) Other (See Comments)    hallucinations  . Horse-Derived Products Hives   Current Outpatient Prescriptions on File Prior to Visit  Medication Sig Dispense Refill  . albuterol (PROVENTIL HFA;VENTOLIN HFA) 108 (90 BASE) MCG/ACT inhaler Inhale 2  puffs into the lungs every 6 (six) hours as needed for wheezing or shortness of breath.  1 Inhaler  5  . Ascorbic Acid (VITAMIN C) 500 MG tablet Take 500 mg by mouth daily.        . cholecalciferol (VITAMIN D) 1000 UNITS tablet Take 1,000 Units by mouth daily.      . clopidogrel (PLAVIX) 75 MG tablet Take 1 tablet (75 mg total) by mouth daily.  90 tablet  3  . cyanocobalamin (,VITAMIN B-12,) 1000 MCG/ML injection 1 ml IM q 2 weeks  10 mL  5  . diclofenac sodium (VOLTAREN) 1 % GEL Apply 2 g topically 4 (four) times daily.  3 Tube  1  . diltiazem (CARDIZEM CD) 180 MG 24 hr capsule Take 1 capsule (180 mg total) by mouth daily.      Marland Kitchen docusate sodium 100 MG CAPS Take 100 mg by mouth 2 (two) times daily.  10 capsule    . famotidine (PEPCID) 20 MG tablet Take 1 tablet (20 mg total) by mouth 2 (two) times daily.  60 tablet  1  . folic acid (FOLVITE) 1 MG tablet Take 1 mg by mouth daily.      . hyoscyamine (LEVSIN, ANASPAZ) 0.125 MG tablet Take 0.125 mg by mouth 4 (four) times daily as needed. Gas pain.      Marland Kitchen levothyroxine (SYNTHROID, LEVOTHROID) 137 MCG tablet Take 1 tablet (137 mcg total) by mouth daily.  90 tablet  3  . methotrexate (RHEUMATREX) 2.5 MG tablet Take 10 mg by mouth once a week. Takes 4 tablets weekly on  Sunday.   Caution:Chemotherapy. Protect from light.      . pravastatin (PRAVACHOL) 40 MG tablet TAKE ONE TABLET BY MOUTH EVERY DAY  30 tablet  5  . traZODone (DESYREL) 100 MG tablet Take 0.5 tablets (50 mg total) by mouth at bedtime.      . [DISCONTINUED] solifenacin (VESICARE) 5 MG tablet Take 5 mg by mouth daily.         No current facility-administered medications on file prior to visit.     Review of Systems  Constitutional: Negative for unexpected weight change, or unusual diaphoresis  HENT: Negative for tinnitus.   Eyes: Negative for photophobia and visual disturbance.  Respiratory: Negative for choking and stridor.   Gastrointestinal: Negative for vomiting and blood in  stool.  Genitourinary: Negative for hematuria and decreased urine volume.  Musculoskeletal: Negative for acute joint swelling Skin: Negative for color change and wound.  Neurological: Negative for tremors and numbness other than noted  Psychiatric/Behavioral: Negative for decreased concentration or  hyperactivity.       Objective:   Physical Exam BP 92/60  Pulse 86  Temp(Src) 97.3  F (36.3 C) (Oral)  Wt 193 lb (87.544 kg)  BMI 29.35 kg/m2  SpO2 92% VS noted, walks with walker, orthostatics noted on chart Constitutional: Pt appears well-developed and well-nourished.  HENT: Head: NCAT.  Right Ear: External ear normal.  Left Ear: External ear normal.  Eyes: Conjunctivae and EOM are normal. Pupils are equal, round, and reactive to light.  Neck: Normal range of motion. Neck supple.  Cardiovascular: Normal rate and regular rhythm.   Pulmonary/Chest: Effort normal and breath sounds decreased bilat.  Abd:  Soft, NT, non-distended, + BS Neurological: Pt is alert. Not confused  Skin: Skin is warm. No erythema. No LE edema Psychiatric: Pt behavior is normal. Thought content normal.  Bilat shoulders NT but with pain on ROM, with left > right decreased Int rotation    Assessment & Plan:

## 2013-05-02 NOTE — Patient Instructions (Addendum)
You had the B12 shot today Please increase the zoloft (sertraline) to 100 mg per day Since your blood pressure dropped today, please continue to HOLD the lasix fluid pill for now Please continue all other medications as before, and refills have been done if requested - the tramadol for pain Please have the pharmacy call with any other refills you may need. Please go to the XRAY Department in the Basement (go straight as you get off the elevator) for the x-ray testing Please go to the LAB in the Basement (turn left off the elevator) for the tests to be done today - the kidney tests and the urine test Please continue all other treament such as physical therapy Please return in 1 week, or sooner if needed

## 2013-05-03 DIAGNOSIS — R32 Unspecified urinary incontinence: Secondary | ICD-10-CM | POA: Insufficient documentation

## 2013-05-03 NOTE — Assessment & Plan Note (Signed)
Suspect this is stable to improved, for f/u cxr today

## 2013-05-03 NOTE — Assessment & Plan Note (Signed)
?   Recent overdiuresis with elev cr prior to d/c, lasix being help, and mild orthostatic by by exam today though he has no symptoms, not volume overloaded by exam, will cont to HOLD lasix for now, check bmp, f/u 1 wk  Note:  Total time for pt hx, exam, review of record with pt in the room, determination of diagnoses and plan for further eval and tx is > 40 min, with over 50% spent in coordination and counseling of patient

## 2013-05-03 NOTE — Assessment & Plan Note (Signed)
clincially improved, to finish antibx,  to f/u any worsening symptoms or concerns

## 2013-05-03 NOTE — Assessment & Plan Note (Signed)
Ok for tramadol re-start, tolerated OK before

## 2013-05-03 NOTE — Assessment & Plan Note (Signed)
1 episode last night, asympt, for UA today, consider need re-start OAB med

## 2013-05-03 NOTE — Assessment & Plan Note (Signed)
For b12 IM today 

## 2013-05-03 NOTE — Assessment & Plan Note (Signed)
Ok for increased zoloft to 100 mg,  to f/u any worsening symptoms or concerns, declines counsleing

## 2013-05-09 ENCOUNTER — Other Ambulatory Visit (INDEPENDENT_AMBULATORY_CARE_PROVIDER_SITE_OTHER): Payer: Medicare Other

## 2013-05-09 ENCOUNTER — Ambulatory Visit (INDEPENDENT_AMBULATORY_CARE_PROVIDER_SITE_OTHER): Payer: Medicare Other | Admitting: Internal Medicine

## 2013-05-09 ENCOUNTER — Encounter: Payer: Self-pay | Admitting: Internal Medicine

## 2013-05-09 ENCOUNTER — Telehealth: Payer: Self-pay | Admitting: *Deleted

## 2013-05-09 VITALS — BP 88/58 | HR 81 | Temp 97.7°F | Wt 193.0 lb

## 2013-05-09 DIAGNOSIS — M25511 Pain in right shoulder: Secondary | ICD-10-CM

## 2013-05-09 DIAGNOSIS — J189 Pneumonia, unspecified organism: Secondary | ICD-10-CM

## 2013-05-09 DIAGNOSIS — I4891 Unspecified atrial fibrillation: Secondary | ICD-10-CM

## 2013-05-09 DIAGNOSIS — M25519 Pain in unspecified shoulder: Secondary | ICD-10-CM

## 2013-05-09 DIAGNOSIS — N184 Chronic kidney disease, stage 4 (severe): Secondary | ICD-10-CM

## 2013-05-09 DIAGNOSIS — I480 Paroxysmal atrial fibrillation: Secondary | ICD-10-CM

## 2013-05-09 LAB — BASIC METABOLIC PANEL
Chloride: 104 mEq/L (ref 96–112)
Creatinine, Ser: 1.5 mg/dL (ref 0.4–1.5)
GFR: 48.78 mL/min — ABNORMAL LOW (ref >60.00)
Potassium: 4.6 mEq/L (ref 3.5–5.1)

## 2013-05-09 NOTE — Telephone Encounter (Signed)
Left msg on triage caregiver  inform her pt hasn't been talking his cardizem. Pt has appt this afternoon just wanted to  make sure md was aware. Called jessi back inform her we did receive her msg & md is aware...Raechel Chute

## 2013-05-09 NOTE — Patient Instructions (Signed)
OK to continue to hold on taking the furosemide (lasix) fluid pill OK to continue to hold on taking the diltiazem for now as well Please keep your appointments with your specialists as you have planned- Cardiology for May 22 as you have planned Please go to the LAB in the Basement (turn left off the elevator) for the tests to be done today You will be contacted by phone if any changes need to be made immediately.  Otherwise, you will receive a letter about your results with an explanation  Please remember to sign up for My Chart if you have not done so, as this will be important to you in the future with finding out test results, communicating by private email, and scheduling acute appointments online when needed.

## 2013-05-09 NOTE — Progress Notes (Signed)
Subjective:    Patient ID: [name], male    DOB: 02/04/1923, 79 y.o.   MRN: [id number]  HPI  Here to f/u with wife, no new complaitns, infact seems to have improved stamina/strength since last visit, and asks if he can mow the lawn, seems to get up from chair and walk faster with two wheeled walker today, still c/o bilat shoudler pain but is ok with current pain med, no other pain, and no new complaints.  For some reason he and wife were confused about his post d/c med and today realize he is Not taking the diltiazem, verified per home health nurse, and cont's to hold the fluid pill/lasix as instructed from last wk.  Pt denies chest pain, increased sob or doe, wheezing, orthopnea, PND, increased LE swelling, palpitations, dizziness or syncope.  Cont's to participate with PT and seems to be progressing.  Pt denies new neurological symptoms such as new headache, or facial or extremity weakness or numbness   Pt denies polydipsia, polyuria.  Has f/u appt with cardiology next wk (thur).. No further incontinence. Past Medical History  Diagnosis Date  . CAD (coronary artery disease)     s/p NSTEMI and BMS stent diagonal07/09;  Lexiscan Myoview 9/13:  EF 62%, no ischemia  . AS (aortic stenosis)     a.  Echo 2009 mean gradient 9mm HG. AVA 2.18;  b.  Echo 4/11 mild AS mean gradient 10mm HG;  c.  Echo 04/2011: Mild LVH, EF 55-60%, grade 1 diastolic dysfunction, mild aortic stenosis, mean gradient 14, mild LAE;  d. Echo 9/13: mod LVH, EF 50-55%, mild to mod AS, mean gradient 17 mmHg  . AF (paroxysmal atrial fibrillation)     Holter monitor 2.5 sec pauses  . Fatigue     CPX 11/09 VO2  14.4 (75% predicte)d, slope 35.  O2 pulse normal.  VO2 corrected for body weight 17.6.  Marland Kitchen Hypothyroidism   . Hyperlipidemia   . History of prostate cancer   . Allergic rhinitis   . DM (diabetes mellitus)     type II diet controlled  . Shingles   . Left carotid stenosis   . Vitamin B 12 deficiency   . Anemia   .  Osteoarthritis   . Nephrolithiasis   . Obesity   . OSA (obstructive sleep apnea)     AHI 15 PSG 09/06/08  . Hx of colonoscopy   . Stroke   . Anxiety and depression   . PMR (polymyalgia rheumatica) 05/29/2012  . Hypogonadism male 05/29/2012  . Hyperparathyroidism 05/29/2012  . TIA (transient ischemic attack) 05/29/2012  . CKD (chronic kidney disease) stage 4, GFR 15-29 ml/min 05/29/2012  . COPD (chronic obstructive pulmonary disease) 05/29/2012  . Myocardial infarction   . Cancer     prostate ca history   Past Surgical History  Procedure Laterality Date  . Appendectomy    . Hernia repair    . Right carpal tunnel release    . Lumbar spine surgery    . Cystectomy      neck  . Prostatectomy    . Vasectomy    . Total knee arthroplasty      right  . Cholecystectomy      reports that he quit smoking about 54 years ago. His smoking use included Cigarettes. He has a 12.5 pack-year smoking history. He has quit using smokeless tobacco. He reports that he does not drink alcohol or use illicit drugs. family history includes Alcohol abuse (age of  onset: 17) in his father; Coronary artery disease in an unspecified family member; and Stroke (age of onset: 45) in his mother. Allergies  Allergen Reactions  . Bactrim (Sulfamethoxazole W-Trimethoprim) Other (See Comments)    hallucinations  . Horse-Derived Products Hives   Current Outpatient Prescriptions on File Prior to Visit  Medication Sig Dispense Refill  . albuterol (PROVENTIL HFA;VENTOLIN HFA) 108 (90 BASE) MCG/ACT inhaler Inhale 2 puffs into the lungs every 6 (six) hours as needed for wheezing or shortness of breath.  1 Inhaler  5  . Ascorbic Acid (VITAMIN C) 500 MG tablet Take 500 mg by mouth daily.        . cholecalciferol (VITAMIN D) 1000 UNITS tablet Take 1,000 Units by mouth daily.      . clopidogrel (PLAVIX) 75 MG tablet Take 1 tablet (75 mg total) by mouth daily.  90 tablet  3  . cyanocobalamin (,VITAMIN B-12,) 1000 MCG/ML injection 1 ml  IM q 2 weeks  10 mL  5  . diclofenac sodium (VOLTAREN) 1 % GEL Apply 2 g topically 4 (four) times daily.  3 Tube  1  . diltiazem (CARDIZEM CD) 180 MG 24 hr capsule Take 1 capsule (180 mg total) by mouth daily.      Marland Kitchen docusate sodium 100 MG CAPS Take 100 mg by mouth 2 (two) times daily.  10 capsule    . famotidine (PEPCID) 20 MG tablet Take 1 tablet (20 mg total) by mouth 2 (two) times daily.  60 tablet  1  . folic acid (FOLVITE) 1 MG tablet Take 1 mg by mouth daily.      . hyoscyamine (LEVSIN, ANASPAZ) 0.125 MG tablet Take 0.125 mg by mouth 4 (four) times daily as needed. Gas pain.      Marland Kitchen levothyroxine (SYNTHROID, LEVOTHROID) 137 MCG tablet Take 1 tablet (137 mcg total) by mouth daily.  90 tablet  3  . methotrexate (RHEUMATREX) 2.5 MG tablet Take 10 mg by mouth once a week. Takes 4 tablets weekly on  Sunday.   Caution:Chemotherapy. Protect from light.      . pravastatin (PRAVACHOL) 40 MG tablet TAKE ONE TABLET BY MOUTH EVERY DAY  30 tablet  5  . sertraline (ZOLOFT) 100 MG tablet Take 1 tablet (100 mg total) by mouth daily.  90 tablet  3  . traMADol (ULTRAM) 50 MG tablet Take 1 tablet (50 mg total) by mouth every 6 (six) hours as needed for pain.  120 tablet  2  . traZODone (DESYREL) 100 MG tablet Take 0.5 tablets (50 mg total) by mouth at bedtime.      . [DISCONTINUED] solifenacin (VESICARE) 5 MG tablet Take 5 mg by mouth daily.         No current facility-administered medications on file prior to visit.   ,Review of Systems  Constitutional: Negative for unexpected weight change, or unusual diaphoresis  HENT: Negative for tinnitus.   Eyes: Negative for photophobia and visual disturbance.  Respiratory: Negative for choking and stridor.   Gastrointestinal: Negative for vomiting and blood in stool.  Genitourinary: Negative for hematuria and decreased urine volume.  Musculoskeletal: Negative for acute joint swelling Skin: Negative for color change and wound.  Neurological: Negative for tremors  and numbness other than noted  Psychiatric/Behavioral: Negative for decreased concentration or  hyperactivity.       Objective:   Physical Exam BP 88/58  Pulse 81  Temp(Src) 97.7 F (36.5 C) (Oral)  Wt 193 lb (87.544 kg)  BMI 29.35 kg/m2  SpO2 95% VS noted, less frail appearing, movements overall stronger and faster, walks with walker easily Constitutional: Pt appears well-developed and well-nourished./obese  HENT: Head: NCAT.  Right Ear: External ear normal.  Left Ear: External ear normal.  Eyes: Conjunctivae and EOM are normal. Pupils are equal, round, and reactive to light.  Neck: Normal range of motion. Neck supple.  Cardiovascular: Normal rate and regular rhythm.   Pulmonary/Chest: Effort normal and breath sounds normal. except still some decreased BS with few crackles left base, no wheezingl right base clear without fremitus Abd:  Soft, NT, non-distended, + BS Neurological: Pt is alert.  Skin: Skin is warm. No erythema.  Psychiatric: Pt behavior is normal.     Assessment & Plan:

## 2013-05-10 NOTE — Assessment & Plan Note (Signed)
Currently not taking the diltiazem, has low BP today though asympt, HR ok, will hold on adding back the dilt for now, has card f/u next wk

## 2013-05-10 NOTE — Assessment & Plan Note (Signed)
clincially resolved, cont PT but should not be mowing the grass

## 2013-05-10 NOTE — Assessment & Plan Note (Signed)
As of the past wk, has not seemed to worsen volume wise, wt no change, to cont to hold lasix for now, f/u bmp today

## 2013-05-10 NOTE — Assessment & Plan Note (Signed)
stable overall by history and exam, and pt to continue medical treatment as before,  to f/u any worsening symptoms or concerns 

## 2013-05-13 ENCOUNTER — Telehealth: Payer: Self-pay | Admitting: Cardiovascular Disease

## 2013-05-13 NOTE — Telephone Encounter (Signed)
HH Nurse wants to give an update on the pt that will establish her with Tereso Newcomer PA-C Thursday 05/15/13. Pt was in hospital DX Pneumonia, she saw later in report CHF but is not listed on DX list, please clarify. Also, pt was to be on diltiazem but didn't have filled and PCP Dr Jonny Ruiz said he shouldn't take it so please review need. She has instructed pt for daily wts if pt does have DX CHF.  Pt feeling good, denies sob/ no edema, bp 130/70 p 58-62. Will route msg as FYI to [employer].

## 2013-05-13 NOTE — Telephone Encounter (Signed)
New problem    Please clarify .  In the patient discharged instruction . CHF was not listed.  Patient has never been told he has CHF, however,  was given information.    Please clarify on daily  weight   C/O Weight gain 2-3 lbs over night.  Eat better on yesterday.

## 2013-05-15 ENCOUNTER — Encounter: Payer: Self-pay | Admitting: Physician Assistant

## 2013-05-15 ENCOUNTER — Ambulatory Visit (INDEPENDENT_AMBULATORY_CARE_PROVIDER_SITE_OTHER): Payer: Medicare Other

## 2013-05-15 ENCOUNTER — Ambulatory Visit (INDEPENDENT_AMBULATORY_CARE_PROVIDER_SITE_OTHER): Payer: Medicare Other | Admitting: Physician Assistant

## 2013-05-15 VITALS — BP 102/64 | HR 74 | Ht 68.0 in | Wt 193.0 lb

## 2013-05-15 DIAGNOSIS — I480 Paroxysmal atrial fibrillation: Secondary | ICD-10-CM

## 2013-05-15 DIAGNOSIS — N184 Chronic kidney disease, stage 4 (severe): Secondary | ICD-10-CM

## 2013-05-15 DIAGNOSIS — I2510 Atherosclerotic heart disease of native coronary artery without angina pectoris: Secondary | ICD-10-CM

## 2013-05-15 DIAGNOSIS — I359 Nonrheumatic aortic valve disorder, unspecified: Secondary | ICD-10-CM

## 2013-05-15 DIAGNOSIS — E538 Deficiency of other specified B group vitamins: Secondary | ICD-10-CM

## 2013-05-15 DIAGNOSIS — I4891 Unspecified atrial fibrillation: Secondary | ICD-10-CM

## 2013-05-15 DIAGNOSIS — J189 Pneumonia, unspecified organism: Secondary | ICD-10-CM

## 2013-05-15 DIAGNOSIS — I10 Essential (primary) hypertension: Secondary | ICD-10-CM

## 2013-05-15 DIAGNOSIS — I350 Nonrheumatic aortic (valve) stenosis: Secondary | ICD-10-CM

## 2013-05-15 DIAGNOSIS — I5032 Chronic diastolic (congestive) heart failure: Secondary | ICD-10-CM

## 2013-05-15 MED ORDER — CYANOCOBALAMIN 1000 MCG/ML IJ SOLN
1000.0000 ug | Freq: Once | INTRAMUSCULAR | Status: AC
  Administered 2013-05-15: 1000 ug via INTRAMUSCULAR

## 2013-05-15 NOTE — Patient Instructions (Addendum)
Your physician recommends that you continue on your current medications as directed. Please refer to the Current Medication list given to you today.   Your physician recommends that you keep your follow-up appointment with Dr. Antoine Poche on Thursday, July 24th at 11:00am   Call us if you have any problems at [phone]

## 2013-05-15 NOTE — Progress Notes (Signed)
[address] [place], [place]  [address] Phone: [phone] Fax:  [phone]  Date:  05/15/2013   ID:  [name], DOB [birth year], MRN [id number]  PCP:  [name], MD  Cardiologist:  Dr. 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   He has a hx of CAD, s/p NSTEMI in 7/09 treated with BMS to the Dx, mild aortic stenosis, HTN, DM2, parox AFib, CKD, prior TIA in 12/2010. He has had a history of sick sinus syndrome in the past. He is not on Coumadin secondary to a history bleeding problems (epistaxis requiring hospitalization). He's also had acute renal failure and hyperkalemia in the setting of diuretic use and diclofenac. CPX testing in 11/09 was overall notable for preserved functional capacity.    LHC 7/09: Proximal LAD 25%, mid LAD 25%, D1 subtotally occluded proximally, proximal AV circumflex 25%, then 40%, ostial OM1 25%, ostial OM2 40%, proximal/mid RCA 25%, EF 50%. PCI: BMS to the diagonal of the LAD.  Carotid Dopplers 1/12:  0-39% stenosis bilaterally. Echo 08/2012: Moderate LVH, EF of 50-55%, normal wall motion, mild to moderate aortic stenosis (mean 17).   Myoview 9/13:  No ischemia, EF 62%.    Patient was seen by cardiology during an admission for syncope in 12/2012. This was felt to be related to orthostasis and Lasix was held. He was noted to have paroxysms of AFib during that admission.  Patient was previously followed by Dr. Gala Romney. Plan was for the patient to followup with Dr. Antoine Poche. He has not been seen in this office since. He was then admitted to the hospital 4/23-4/29 with acute on chronic respiratory failure in the setting of pneumonia and pleural effusion. He had a right-sided effusion that was felt to be parapneumonic secondary to community acquired pneumonia. He underwent right-sided  thoracentesis.  The notes indicate that his rate in atrial fibrillation remained controlled. I do not see any ECGs that document AFib (only NSR).  Patient had significant deconditioning and remained in inpatient rehabilitation from 4/29-5/7.  He has f/u with his PCP.  There was a question of whether or not he should be on diltiazem.  This was on his d/c list but he was not taking it.  BPs were noted to be low and he was mildly orthostatic.  Lasix was also held by his PCP.    Patient is slowly recovering.  Still feels weak.  Working with HHPT/OT.  Denies chest pain, syncope.  No significant DOE.  Weights at home are up and down.  No significant edema.  No orthopnea, PND.  Labs (1/14):  TSH 0.364 Labs (4/14):  K 3.7, Cr 1.06, ALT 34, Hgb 12.1 Labs (5/14):  K 4.6, Cr 1.5,   Wt Readings from Last 3 Encounters:  05/15/13 193 lb (87.544 kg)  05/09/13 193 lb (87.544 kg)  05/02/13 193 lb (87.544 kg)     Past Medical History  Diagnosis Date  . CAD (coronary artery disease)     s/p NSTEMI and BMS stent diagonal07/09;  Lexiscan Myoview 9/13:  EF 62%, no ischemia  . AS (aortic stenosis)     a.  Echo 2009 mean gradient 9mm HG. AVA 2.18;  b.  Echo 4/11 mild AS mean gradient 10mm HG;  c.  Echo 04/2011: Mild LVH, EF 55-60%, grade 1  diastolic dysfunction, mild aortic stenosis, mean gradient 14, mild LAE;  d. Echo 9/13: mod LVH, EF 50-55%, mild to mod AS, mean gradient 17 mmHg  . AF (paroxysmal atrial fibrillation)     Holter monitor 2.5 sec pauses  . Fatigue     CPX 11/09 VO2  14.4 (75% predicte)d, slope 35.  O2 pulse normal.  VO2 corrected for body weight 17.6.  Marland Kitchen Hypothyroidism   . Hyperlipidemia   . History of prostate cancer   . Allergic rhinitis   . DM (diabetes mellitus)     type II diet controlled  . Shingles   . Left carotid stenosis   . Vitamin B 12 deficiency   . Anemia   . Osteoarthritis   . Nephrolithiasis   . Obesity   . OSA (obstructive sleep apnea)     AHI 15 PSG 09/06/08  . Hx  of colonoscopy   . Stroke   . Anxiety and depression   . PMR (polymyalgia rheumatica) 05/29/2012  . Hypogonadism male 05/29/2012  . Hyperparathyroidism 05/29/2012  . TIA (transient ischemic attack) 05/29/2012  . CKD (chronic kidney disease) stage 4, GFR 15-29 ml/min 05/29/2012  . COPD (chronic obstructive pulmonary disease) 05/29/2012  . Myocardial infarction   . Cancer     prostate ca history    Current Outpatient Prescriptions  Medication Sig Dispense Refill  . albuterol (PROVENTIL HFA;VENTOLIN HFA) 108 (90 BASE) MCG/ACT inhaler Inhale 2 puffs into the lungs every 6 (six) hours as needed for wheezing or shortness of breath.  1 Inhaler  5  . Ascorbic Acid (VITAMIN C) 500 MG tablet Take 500 mg by mouth daily.        . cholecalciferol (VITAMIN D) 1000 UNITS tablet Take 1,000 Units by mouth daily.      . clopidogrel (PLAVIX) 75 MG tablet Take 1 tablet (75 mg total) by mouth daily.  90 tablet  3  . cyanocobalamin (,VITAMIN B-12,) 1000 MCG/ML injection 1 ml IM q 2 weeks  10 mL  5  . diclofenac sodium (VOLTAREN) 1 % GEL Apply 2 g topically 4 (four) times daily.  3 Tube  1  . docusate sodium 100 MG CAPS Take 100 mg by mouth 2 (two) times daily.  10 capsule    . folic acid (FOLVITE) 1 MG tablet Take 1 mg by mouth daily.      . hyoscyamine (LEVSIN, ANASPAZ) 0.125 MG tablet Take 0.125 mg by mouth 4 (four) times daily as needed. Gas pain.      Marland Kitchen levothyroxine (SYNTHROID, LEVOTHROID) 137 MCG tablet Take 1 tablet (137 mcg total) by mouth daily.  90 tablet  3  . methotrexate (RHEUMATREX) 2.5 MG tablet Take 10 mg by mouth once a week. Takes 4 tablets weekly on  Sunday.   Caution:Chemotherapy. Protect from light.      . pravastatin (PRAVACHOL) 40 MG tablet TAKE ONE TABLET BY MOUTH EVERY DAY  30 tablet  5  . sertraline (ZOLOFT) 100 MG tablet Take 1 tablet (100 mg total) by mouth daily.  90 tablet  3  . traMADol (ULTRAM) 50 MG tablet Take 1 tablet (50 mg total) by mouth every 6 (six) hours as needed for pain.  120  tablet  2  . traZODone (DESYREL) 100 MG tablet Take 0.5 tablets (50 mg total) by mouth at bedtime.      . [DISCONTINUED] solifenacin (VESICARE) 5 MG tablet Take 5 mg by mouth daily.         No current facility-administered  medications for this visit.    Allergies:    Allergies  Allergen Reactions  . Bactrim (Sulfamethoxazole W-Trimethoprim) Other (See Comments)    hallucinations  . Horse-Derived Products Hives    Social History:  The patient  reports that he quit smoking about 54 years ago. His smoking use included Cigarettes. He has a 12.5 pack-year smoking history. He has quit using smokeless tobacco. He reports that he does not drink alcohol or use illicit drugs.   ROS:  Please see the history of present illness.      All other systems reviewed and negative.   PHYSICAL EXAM: VS:  BP 102/64  Pulse 74  Ht 5\' 8"  (1.727 m)  Wt 193 lb (87.544 kg)  BMI 29.35 kg/m2 Well nourished, well developed, in no acute distress HEENT: normal Neck: no JVD at 90 Cardiac:  normal S1, S2; RRR; no murmur Lungs:  clear to auscultation bilaterally, no wheezing, rhonchi or rales Abd: soft, nontender, no hepatomegaly Ext: no edema Skin: warm and dry Neuro:  CNs 2-12 intact, no focal abnormalities noted  EKG:  NSR, HR 74, nonspecific ST-T wave changes     ASSESSMENT AND PLAN:  1. Chronic Diastolic CHF:  He has apparently been on diuretics in the past for volume overload.  Recent pleural effusion was from pneumonia.  He is stable off diuretics at this time.  I have encouraged him to continue to weigh himself and to notify us if his weight is going up and he is having increased edema and dyspnea.   2. Aortic Stenosis:  Mild by last echo.  Will likely need f/u echo in 08/2013. 3. CAD:  Stable.  No angina.  Continue Plavix, statin. 4. Atrial Fibrillation:  Maintaining NSR.  He is not a coumadin candidate.  He was not d/c on diltiazem.  I do not think his BP would tolerate.  I agree with him not starting  back on this. 5. Hypertension:  Controlled. 6. CKD:  Stable creatinine by recent measurements.  7. Pneumonia:  Resolving.  He remains deconditioned, but is slowly improving. 8. Disposition:  F/u with Dr. [name] in 2 mos.   Signed, Tereso Newcomer, PA-C  05/15/2013 12:51 PM

## 2013-05-16 ENCOUNTER — Encounter: Payer: Medicare Other | Admitting: Physical Medicine & Rehabilitation

## 2013-05-16 NOTE — Telephone Encounter (Signed)
Patient seen 05/15/13.  Issues discussed. Tereso Newcomer, PA-C   05/16/2013 8:09 AM

## 2013-05-20 ENCOUNTER — Telehealth: Payer: Self-pay

## 2013-05-20 NOTE — Telephone Encounter (Signed)
Ok for verbal 

## 2013-05-20 NOTE — Telephone Encounter (Signed)
HHOT called to report pt's O2 levels drop during showering - 86%, 82% with dizziness. HHOT is requesting verbal orders to extend visits x 2 weeks and to D/C [facility] aide

## 2013-05-20 NOTE — Telephone Encounter (Signed)
HHRN informed 

## 2013-05-21 ENCOUNTER — Telehealth: Payer: Self-pay

## 2013-05-21 NOTE — Telephone Encounter (Signed)
The patients caregiver called to inform they have at the home benadryl and claritin, but no allegra.  Would either be ok to take for his symptoms if not they can get allegra if needed.

## 2013-05-21 NOTE — Telephone Encounter (Signed)
yes

## 2013-05-21 NOTE — Telephone Encounter (Signed)
Not sure what to say, except may try allegra otc as needed

## 2013-05-21 NOTE — Telephone Encounter (Signed)
HHRN to inform patient has had a mild headache, not sleeping well and difficulty with eyes focusing for 4 days.   Vitals today were 130/80, 97% oxygen and temp 97.5.  Please advise call back number for [facility] [phone]

## 2013-05-21 NOTE — Telephone Encounter (Signed)
[facility] informed of MD instruction.  HHRN also needs a verbal to see the patient once more this week

## 2013-05-22 NOTE — Telephone Encounter (Signed)
HHRN informed of all MD information

## 2013-05-23 ENCOUNTER — Encounter: Payer: Medicare Other | Admitting: Physical Medicine & Rehabilitation

## 2013-05-26 ENCOUNTER — Telehealth: Payer: Self-pay | Admitting: *Deleted

## 2013-05-26 NOTE — Telephone Encounter (Signed)
Ok for verbal 

## 2013-05-26 NOTE — Telephone Encounter (Signed)
[facility] PT is calling for 2 orders to extend HHPT this week since pt had missed 2 previous visits when caring for his wife.

## 2013-05-26 NOTE — Telephone Encounter (Signed)
HHRN informed 

## 2013-05-29 ENCOUNTER — Telehealth: Payer: Self-pay

## 2013-05-29 NOTE — Telephone Encounter (Signed)
HHRN left msg. On triage to inform PCP [facility] had a missed visit today (05/28/13).  Patient did not feel up to shower training.  No return call needed just to inform PCP.

## 2013-05-30 ENCOUNTER — Telehealth: Payer: Self-pay

## 2013-05-30 NOTE — Telephone Encounter (Signed)
Ok for verbal 

## 2013-05-30 NOTE — Telephone Encounter (Signed)
Phone call from Nicki Guadalajara OT with [facility] [phone] is requesting an order to continue OT for shower training two more times next week. She feels he could really use this. His O2 level stayed above 90% but she would like to reinforce pacing.

## 2013-06-02 NOTE — Telephone Encounter (Signed)
HHRN informed 

## 2013-06-03 ENCOUNTER — Telehealth: Payer: Self-pay | Admitting: *Deleted

## 2013-06-03 NOTE — Telephone Encounter (Signed)
Ok for verbal 

## 2013-06-03 NOTE — Telephone Encounter (Signed)
Left msg on triage wanting to get order for 2 more visits. That would complete the initial order. We miss 2 visits whn pt wife was sick...Raechel Chute

## 2013-06-03 NOTE — Telephone Encounter (Signed)
Notified [facility] with md response...Raechel Chute

## 2013-06-03 NOTE — Telephone Encounter (Signed)
HHRN informed ( left detailed msg.)

## 2013-06-04 ENCOUNTER — Encounter: Payer: Medicare Other | Attending: Physical Medicine & Rehabilitation | Admitting: Physical Medicine & Rehabilitation

## 2013-06-04 ENCOUNTER — Encounter: Payer: Self-pay | Admitting: Internal Medicine

## 2013-06-04 ENCOUNTER — Ambulatory Visit (INDEPENDENT_AMBULATORY_CARE_PROVIDER_SITE_OTHER): Payer: Medicare Other | Admitting: Internal Medicine

## 2013-06-04 ENCOUNTER — Encounter: Payer: Self-pay | Admitting: Physical Medicine & Rehabilitation

## 2013-06-04 VITALS — BP 91/50 | HR 85 | Resp 15 | Ht 68.0 in | Wt 190.0 lb

## 2013-06-04 VITALS — BP 110/62 | HR 75 | Temp 98.3°F | Ht 68.0 in | Wt 190.2 lb

## 2013-06-04 DIAGNOSIS — R269 Unspecified abnormalities of gait and mobility: Secondary | ICD-10-CM

## 2013-06-04 DIAGNOSIS — E119 Type 2 diabetes mellitus without complications: Secondary | ICD-10-CM

## 2013-06-04 DIAGNOSIS — E538 Deficiency of other specified B group vitamins: Secondary | ICD-10-CM

## 2013-06-04 DIAGNOSIS — I10 Essential (primary) hypertension: Secondary | ICD-10-CM

## 2013-06-04 DIAGNOSIS — F329 Major depressive disorder, single episode, unspecified: Secondary | ICD-10-CM

## 2013-06-04 DIAGNOSIS — J449 Chronic obstructive pulmonary disease, unspecified: Secondary | ICD-10-CM

## 2013-06-04 MED ORDER — CYANOCOBALAMIN 1000 MCG/ML IJ SOLN
1000.0000 ug | Freq: Once | INTRAMUSCULAR | Status: AC
  Administered 2013-06-04: 1000 ug via INTRAMUSCULAR

## 2013-06-04 NOTE — Patient Instructions (Signed)
OK to stop the every 2 wks B12 injections for now Please take an OTC B complex multivitamin every day Please continue all other medications as before, and refills have been done if requested. There does not appear to be any need for further lab work or medication changes today Please keep your appointments with your specialists as you have planned - cardiology Please continue your efforts at being more active, low cholesterol diet, and weight control. Remember to call or return here or Cardiology, if your weight increases and swelling comes back  Please return in 3 months, or sooner if needed

## 2013-06-04 NOTE — Assessment & Plan Note (Signed)
Some improved,Please continue all other medications as before

## 2013-06-04 NOTE — Assessment & Plan Note (Signed)
stable overall by history and exam, recent data reviewed with pt, and pt to continue medical treatment as before,  to f/u any worsening symptoms or concerns SpO2 Readings from Last 3 Encounters:  06/04/13 95%  06/04/13 98%  05/09/13 95%

## 2013-06-04 NOTE — Progress Notes (Signed)
Subjective:    Patient ID: [name], male    DOB: 10-15-1923, 79 y.o.   MRN: [id number]  HPI  Pain Inventory Average Pain 6 Pain Right Now 4 My pain is dull  In the last 24 hours, has pain interfered with the following? General activity 4 Relation with others 4 Enjoyment of life 8 What TIME of day is your pain at its worst? morning Sleep (in general) Fair  Pain is worse with: walking, bending and inactivity Pain improves with: pacing activities Relief from Meds: na  Mobility use a walker how many minutes can you walk? 10 ability to climb steps?  no do you drive?  no  Function retired  Neuro/Psych bladder control problems bowel control problems trouble walking spasms dizziness  Prior Studies x-rays  Physicians involved in your care Any changes since last visit?  no   Family History  Problem Relation Age of Onset  . Stroke Mother 64  . Coronary artery disease      father, brother, sister  . Alcohol abuse Father 19   History   Social History  . Marital Status: Married    Spouse Name: N/A    Number of Children: N/A  . Years of Education: N/A   Occupational History  . retired    Social History Main Topics  . Smoking status: Former Smoker -- 0.50 packs/day for 25 years    Types: Cigarettes    Quit date: 12/25/1958  . Smokeless tobacco: Former Neurosurgeon     Comment: quit in 1950s  . Alcohol Use: No  . Drug Use: No  . Sexually Active: No   Other Topics Concern  . None   Social History Narrative  . None   Past Surgical History  Procedure Laterality Date  . Appendectomy    . Hernia repair    . Right carpal tunnel release    . Lumbar spine surgery    . Cystectomy      neck  . Prostatectomy    . Vasectomy    . Total knee arthroplasty      right  . Cholecystectomy     Past Medical History  Diagnosis Date  . CAD (coronary artery disease)     s/p NSTEMI and BMS stent diagonal07/09;  Lexiscan Myoview 9/13:  EF 62%, no ischemia  . AS  (aortic stenosis)     a.  Echo 2009 mean gradient 9mm HG. AVA 2.18;  b.  Echo 4/11 mild AS mean gradient 10mm HG;  c.  Echo 04/2011: Mild LVH, EF 55-60%, grade 1 diastolic dysfunction, mild aortic stenosis, mean gradient 14, mild LAE;  d. Echo 9/13: mod LVH, EF 50-55%, mild to mod AS, mean gradient 17 mmHg  . AF (paroxysmal atrial fibrillation)     Holter monitor 2.5 sec pauses  . Fatigue     CPX 11/09 VO2  14.4 (75% predicte)d, slope 35.  O2 pulse normal.  VO2 corrected for body weight 17.6.  Marland Kitchen Hypothyroidism   . Hyperlipidemia   . History of prostate cancer   . Allergic rhinitis   . DM (diabetes mellitus)     type II diet controlled  . Shingles   . Left carotid stenosis   . Vitamin B 12 deficiency   . Anemia   . Osteoarthritis   . Nephrolithiasis   . Obesity   . OSA (obstructive sleep apnea)     AHI 15 PSG 09/06/08  . Hx of colonoscopy   . Stroke   .  Anxiety and depression   . PMR (polymyalgia rheumatica) 05/29/2012  . Hypogonadism male 05/29/2012  . Hyperparathyroidism 05/29/2012  . TIA (transient ischemic attack) 05/29/2012  . CKD (chronic kidney disease) stage 4, GFR 15-29 ml/min 05/29/2012  . COPD (chronic obstructive pulmonary disease) 05/29/2012  . Myocardial infarction   . Cancer     prostate ca history   BP 91/50  Pulse 85  Resp 15  Ht 5\' 8"  (1.727 m)  Wt 190 lb (86.183 kg)  BMI 28.9 kg/m2  SpO2 98%     Review of Systems  Gastrointestinal: Positive for diarrhea.  Musculoskeletal: Positive for gait problem.  Neurological: Positive for dizziness.       Spasms  All other systems reviewed and are negative.       Objective:   Physical Exam        Assessment & Plan:  NO SHOW. PT LEFT ROOM BEFORE I ARRIVED. I WAS ONLY A FEW MINUTES BEHIND.

## 2013-06-04 NOTE — Assessment & Plan Note (Signed)
stable overall by history and exam, recent data reviewed with pt, and pt to continue medical treatment as before,  to f/u any worsening symptoms or concerns Lab Results  Component Value Date   HGBA1C 5.9* 12/24/2012

## 2013-06-04 NOTE — Progress Notes (Signed)
Subjective:    Patient ID: [name], male    DOB: Dec 29, 1922, 79 y.o.   MRN: [id number]  HPI  Here to f/u; overall doing ok,  Pt denies chest pain, increased sob or doe, wheezing, orthopnea, PND, increased LE swelling, palpitations, dizziness or syncope. Wt has been somewhat variable up and down 3 lbs, actually down 3 lbs here from last visit.   Pt denies polydipsia, polyuria, or low sugar symptoms such as weakness or confusion improved with po intake.  Pt denies new neurological symptoms such as new headache, or facial or extremity weakness or numbness.   Pt states overall good compliance with meds, has been trying to follow lower cholesterol diet, with wt overall stable,  but little exercise however, walks with walker, does look faster and stronger in movements today, has only 2 sessions PT left to complete. No recent falls.  Pt denies fever, wt loss, night sweats, loss of appetite, or other constitutional symptoms still getting q 2 wks b12 shots. ? Improved with zoloft increased to 100 mg? Past Medical History  Diagnosis Date  . CAD (coronary artery disease)     s/p NSTEMI and BMS stent diagonal07/09;  Lexiscan Myoview 9/13:  EF 62%, no ischemia  . AS (aortic stenosis)     a.  Echo 2009 mean gradient 9mm HG. AVA 2.18;  b.  Echo 4/11 mild AS mean gradient 10mm HG;  c.  Echo 04/2011: Mild LVH, EF 55-60%, grade 1 diastolic dysfunction, mild aortic stenosis, mean gradient 14, mild LAE;  d. Echo 9/13: mod LVH, EF 50-55%, mild to mod AS, mean gradient 17 mmHg  . AF (paroxysmal atrial fibrillation)     Holter monitor 2.5 sec pauses  . Fatigue     CPX 11/09 VO2  14.4 (75% predicte)d, slope 35.  O2 pulse normal.  VO2 corrected for body weight 17.6.  Marland Kitchen Hypothyroidism   . Hyperlipidemia   . History of prostate cancer   . Allergic rhinitis   . DM (diabetes mellitus)     type II diet controlled  . Shingles   . Left carotid stenosis   . Vitamin B 12 deficiency   . Anemia   . Osteoarthritis   .  Nephrolithiasis   . Obesity   . OSA (obstructive sleep apnea)     AHI 15 PSG 09/06/08  . Hx of colonoscopy   . Stroke   . Anxiety and depression   . PMR (polymyalgia rheumatica) 05/29/2012  . Hypogonadism male 05/29/2012  . Hyperparathyroidism 05/29/2012  . TIA (transient ischemic attack) 05/29/2012  . CKD (chronic kidney disease) stage 4, GFR 15-29 ml/min 05/29/2012  . COPD (chronic obstructive pulmonary disease) 05/29/2012  . Myocardial infarction   . Cancer     prostate ca history   Past Surgical History  Procedure Laterality Date  . Appendectomy    . Hernia repair    . Right carpal tunnel release    . Lumbar spine surgery    . Cystectomy      neck  . Prostatectomy    . Vasectomy    . Total knee arthroplasty      right  . Cholecystectomy      reports that he quit smoking about 54 years ago. His smoking use included Cigarettes. He has a 12.5 pack-year smoking history. He has quit using smokeless tobacco. He reports that he does not drink alcohol or use illicit drugs. family history includes Alcohol abuse (age of onset: 43) in his father; Coronary  artery disease in an unspecified family member; and Stroke (age of onset: 50) in his mother. Allergies  Allergen Reactions  . Bactrim (Sulfamethoxazole W-Trimethoprim) Other (See Comments)    hallucinations  . Horse-Derived Products Hives   Current Outpatient Prescriptions on File Prior to Visit  Medication Sig Dispense Refill  . albuterol (PROVENTIL HFA;VENTOLIN HFA) 108 (90 BASE) MCG/ACT inhaler Inhale 2 puffs into the lungs every 6 (six) hours as needed for wheezing or shortness of breath.  1 Inhaler  5  . Ascorbic Acid (VITAMIN C) 500 MG tablet Take 500 mg by mouth daily.        . cholecalciferol (VITAMIN D) 1000 UNITS tablet Take 1,000 Units by mouth daily.      . clopidogrel (PLAVIX) 75 MG tablet Take 1 tablet (75 mg total) by mouth daily.  90 tablet  3  . cyanocobalamin (,VITAMIN B-12,) 1000 MCG/ML injection 1 ml IM q 2 weeks  10 mL   5  . diclofenac sodium (VOLTAREN) 1 % GEL Apply 2 g topically 4 (four) times daily.  3 Tube  1  . docusate sodium 100 MG CAPS Take 100 mg by mouth 2 (two) times daily.  10 capsule    . folic acid (FOLVITE) 1 MG tablet Take 1 mg by mouth daily.      . hyoscyamine (LEVSIN, ANASPAZ) 0.125 MG tablet Take 0.125 mg by mouth 4 (four) times daily as needed. Gas pain.      Marland Kitchen levothyroxine (SYNTHROID, LEVOTHROID) 137 MCG tablet Take 1 tablet (137 mcg total) by mouth daily.  90 tablet  3  . methotrexate (RHEUMATREX) 2.5 MG tablet Take 10 mg by mouth once a week. Takes 4 tablets weekly on  Sunday.   Caution:Chemotherapy. Protect from light.      . pravastatin (PRAVACHOL) 40 MG tablet TAKE ONE TABLET BY MOUTH EVERY DAY  30 tablet  5  . sertraline (ZOLOFT) 100 MG tablet Take 1 tablet (100 mg total) by mouth daily.  90 tablet  3  . traMADol (ULTRAM) 50 MG tablet Take 1 tablet (50 mg total) by mouth every 6 (six) hours as needed for pain.  120 tablet  2  . traZODone (DESYREL) 100 MG tablet Take 0.5 tablets (50 mg total) by mouth at bedtime.      . [DISCONTINUED] solifenacin (VESICARE) 5 MG tablet Take 5 mg by mouth daily.         No current facility-administered medications on file prior to visit.   Review of Systems  Constitutional: Negative for unexpected weight change, or unusual diaphoresis  HENT: Negative for tinnitus.   Eyes: Negative for photophobia and visual disturbance.  Respiratory: Negative for choking and stridor.   Gastrointestinal: Negative for vomiting and blood in stool.  Genitourinary: Negative for hematuria and decreased urine volume.  Musculoskeletal: Negative for acute joint swelling Skin: Negative for color change and wound.  Neurological: Negative for tremors and numbness other than noted  Psychiatric/Behavioral: Negative for decreased concentration or  hyperactivity.       Objective:   Physical Exam BP 110/62  Pulse 75  Temp(Src) 98.3 F (36.8 C) (Oral)  Ht 5\' 8"  (1.727 m)   Wt 190 lb 4 oz (86.297 kg)  BMI 28.93 kg/m2  SpO2 95% VS noted,  Constitutional: Pt appears well-developed and well-nourished.  HENT: Head: NCAT.  Right Ear: External ear normal.  Left Ear: External ear normal.  Eyes: Conjunctivae and EOM are normal. Pupils are equal, round, and reactive to light.  Neck:  Normal range of motion. Neck supple.  Cardiovascular: Normal rate and regular rhythm.   Pulmonary/Chest: Effort normal and breath sounds mild decreased no rales or wheezing.  Abd:  Soft, NT, non-distended, + BS Neurological: Pt is alert. Not confused , somewhat hard of hearing but comprehension good, follows commands, gets up on exam table with small assist ony Skin: Skin is warm. No erythema. No LE edema, no ulcers Psychiatric: Pt behavior is normal. Thought content normal. somewhat gruff and irritable today    Assessment & Plan:

## 2013-06-04 NOTE — Assessment & Plan Note (Signed)
stable overall by history and exam, recent data reviewed with pt, and pt to continue medical treatment as before,  to f/u any worsening symptoms or concerns BP Readings from Last 3 Encounters:  06/04/13 110/62  06/04/13 91/50  05/15/13 102/64

## 2013-06-16 ENCOUNTER — Other Ambulatory Visit: Payer: Self-pay | Admitting: Internal Medicine

## 2013-06-16 MED ORDER — TRAZODONE HCL 100 MG PO TABS: 50.0000 mg | ORAL_TABLET | Freq: Every day | ORAL | Status: DC

## 2013-06-16 NOTE — Addendum Note (Signed)
Addended by: [name] on: 06/16/2013 12:30 PM   Modules accepted: Orders

## 2013-06-16 NOTE — Telephone Encounter (Signed)
Pt is requesting Trazadone 100 mg.  He wants a 3 mo supply.

## 2013-07-07 ENCOUNTER — Telehealth: Payer: Self-pay | Admitting: *Deleted

## 2013-07-08 NOTE — Telephone Encounter (Signed)
A user error has taken place.

## 2013-07-10 ENCOUNTER — Telehealth: Payer: Self-pay

## 2013-07-10 MED ORDER — FAMOTIDINE 20 MG PO TABS: 20.0000 mg | ORAL_TABLET | Freq: Two times a day (BID) | ORAL | Status: DC

## 2013-07-10 NOTE — Telephone Encounter (Signed)
Patient informed. 

## 2013-07-10 NOTE — Telephone Encounter (Signed)
Done erx 

## 2013-07-10 NOTE — Telephone Encounter (Signed)
Received message requesting a Rx refill on famotidine 20 mg BID. Medication last filled by Dr Sandria Manly but need PCP to refill to [facility]. Thanks

## 2013-07-17 ENCOUNTER — Encounter: Payer: Self-pay | Admitting: Cardiology

## 2013-07-17 ENCOUNTER — Ambulatory Visit (INDEPENDENT_AMBULATORY_CARE_PROVIDER_SITE_OTHER): Payer: Medicare Other | Admitting: Cardiology

## 2013-07-17 VITALS — BP 80/60 | HR 96 | Ht 68.0 in | Wt 194.0 lb

## 2013-07-17 DIAGNOSIS — R5383 Other fatigue: Secondary | ICD-10-CM

## 2013-07-17 LAB — CBC
HCT: 39.6 % (ref 39.0–52.0)
RBC: 3.91 Mil/uL — ABNORMAL LOW (ref 4.22–5.81)
RDW: 15.9 % — ABNORMAL HIGH (ref 11.5–14.6)
WBC: 9.3 10*3/uL (ref 4.5–10.5)

## 2013-07-17 LAB — TSH: TSH: 2.35 u[IU]/mL (ref 0.35–5.50)

## 2013-07-17 NOTE — Progress Notes (Signed)
HPI  The patient presents for evaluation of CAD, PAF, syncope with orthostasis.  He had previously seen Dr. Gala Romney. He is a lovely old [employer] War [employer] veteran. He has an extensive cardiac history as described. Recently he's had no acute symptoms. I did review his last hospitalization from April when he was admitted with pneumonia and a fall. His biggest complaint is weakness. He has difficulty getting around because of this. He's not describing any new shortness of breath, PND or orthopnea. He's not describing any chest pressure, neck or arm discomfort. He's not having any palpitations , presyncope or syncope.   Allergies  Allergen Reactions  . Bactrim (Sulfamethoxazole W-Trimethoprim) Other (See Comments)    hallucinations  . Horse-Derived Products Hives    Current Outpatient Prescriptions  Medication Sig Dispense Refill  . albuterol (PROVENTIL HFA;VENTOLIN HFA) 108 (90 BASE) MCG/ACT inhaler Inhale 2 puffs into the lungs every 6 (six) hours as needed for wheezing or shortness of breath.  1 Inhaler  5  . Ascorbic Acid (VITAMIN C) 500 MG tablet Take 500 mg by mouth daily.        . cholecalciferol (VITAMIN D) 1000 UNITS tablet Take 1,000 Units by mouth daily.      . clopidogrel (PLAVIX) 75 MG tablet Take 1 tablet (75 mg total) by mouth daily.  90 tablet  3  . diclofenac sodium (VOLTAREN) 1 % GEL Apply 2 g topically 4 (four) times daily.  3 Tube  1  . docusate sodium 100 MG CAPS Take 100 mg by mouth 2 (two) times daily.  10 capsule    . famotidine (PEPCID) 20 MG tablet Take 1 tablet (20 mg total) by mouth 2 (two) times daily.  180 tablet  3  . folic acid (FOLVITE) 1 MG tablet Take 1 mg by mouth daily.      . hyoscyamine (LEVSIN, ANASPAZ) 0.125 MG tablet Take 0.125 mg by mouth 4 (four) times daily as needed. Gas pain.      Marland Kitchen levothyroxine (SYNTHROID, LEVOTHROID) 137 MCG tablet Take 1 tablet (137 mcg total) by mouth daily.  90 tablet  3  . methotrexate (RHEUMATREX) 2.5 MG tablet Take 10 mg by  mouth once a week. Takes 4 tablets weekly on  Sunday.   Caution:Chemotherapy. Protect from light.      . pravastatin (PRAVACHOL) 40 MG tablet TAKE ONE TABLET BY MOUTH EVERY DAY  30 tablet  5  . sertraline (ZOLOFT) 100 MG tablet Take 1 tablet (100 mg total) by mouth daily.  90 tablet  3  . traMADol (ULTRAM) 50 MG tablet Take 1 tablet (50 mg total) by mouth every 6 (six) hours as needed for pain.  120 tablet  2  . traZODone (DESYREL) 100 MG tablet Take 0.5 tablets (50 mg total) by mouth at bedtime.  90 tablet  1  . [DISCONTINUED] solifenacin (VESICARE) 5 MG tablet Take 5 mg by mouth daily.         No current facility-administered medications for this visit.    Past Medical History  Diagnosis Date  . CAD (coronary artery disease)     s/p NSTEMI and BMS stent diagonal07/09;  Lexiscan Myoview 9/13:  EF 62%, no ischemia  . AS (aortic stenosis)     a.  Echo 2009 mean gradient 9mm HG. AVA 2.18;  b.  Echo 4/11 mild AS mean gradient 10mm HG;  c.  Echo 04/2011: Mild LVH, EF 55-60%, grade 1 diastolic dysfunction, mild aortic stenosis, mean gradient 14, mild LAE;  d. Echo 9/13: mod LVH, EF 50-55%, mild to mod AS, mean gradient 17 mmHg  . AF (paroxysmal atrial fibrillation)     Holter monitor 2.5 sec pauses  . Fatigue     CPX 11/09 VO2  14.4 (75% predicte)d, slope 35.  O2 pulse normal.  VO2 corrected for body weight 17.6.  Marland Kitchen Hypothyroidism   . Hyperlipidemia   . History of prostate cancer   . Allergic rhinitis   . DM (diabetes mellitus)     type II diet controlled  . Shingles   . Left carotid stenosis   . Vitamin B 12 deficiency   . Anemia   . Osteoarthritis   . Nephrolithiasis   . Obesity   . OSA (obstructive sleep apnea)     AHI 15 PSG 09/06/08  . Hx of colonoscopy   . Stroke   . Anxiety and depression   . PMR (polymyalgia rheumatica) 05/29/2012  . Hypogonadism male 05/29/2012  . Hyperparathyroidism 05/29/2012  . TIA (transient ischemic attack) 05/29/2012  . CKD (chronic kidney disease) stage  4, GFR 15-29 ml/min 05/29/2012  . COPD (chronic obstructive pulmonary disease) 05/29/2012  . Myocardial infarction   . Cancer     prostate ca history    Past Surgical History  Procedure Laterality Date  . Appendectomy    . Hernia repair    . Right carpal tunnel release    . Lumbar spine surgery    . Cystectomy      neck  . Prostatectomy    . Vasectomy    . Total knee arthroplasty      right  . Cholecystectomy      ROS:  As stated in the HPI and negative for all other systems.  PHYSICAL EXAM BP 80/60  Pulse 96  Ht 5\' 8"  (1.727 m)  Wt 194 lb (87.998 kg)  BMI 29.5 kg/m2 GEN:  No distress, frail appearing NECK:  No jugular venous distention at 90 degrees, waveform within normal limits, carotid upstroke brisk and symmetric, no bruits, no thyromegaly LYMPHATICS:  No cervical adenopathy LUNGS:  Clear to auscultation bilaterally BACK:  No CVA tenderness CHEST:  Unremarkable HEART:  S1 and S2 within normal limits, no S3, no S4, no clicks, no rubs, no murmurs, distant heart sounds ABD:  Positive bowel sounds normal in frequency in pitch, no bruits, no rebound, no guarding, unable to assess midline mass or bruit with the patient seated. EXT:  2 plus pulses throughout, moderate edema, no cyanosis no clubbing SKIN:  No rashes no nodules NEURO:  Cranial nerves II through XII grossly intact, motor grossly intact throughout PSYCH:  Cognitively intact, oriented to person place and time    ASSESSMENT AND PLAN  Chronic Diastolic CHF: he seems to be euvolemic. At this point I will not make any changes.  Aortic Stenosis: Mild by last echo. I will follow this clinically and do not plan repeat imaging at this time.  CAD:  The patient has no new sypmtoms.  No further cardiovascular testing is indicated.  We will continue with aggressive risk reduction and meds as listed.  Atrial Fibrillation: Maintaining NSR. He is not a warfarin candidate with his frailty. No change in therapy is  indicated.  Hypotension: this may well be contributing to his weakness. His caregiver will check his blood pressure for the next 2 weeks. If he remains low I may actually start him on midodrin. He's had a normal cortisol recently. I will be checking a CBC and TSH as  well.  CKD: Stable creatinine by recent measurements.

## 2013-07-17 NOTE — Patient Instructions (Addendum)
The current medical regimen is effective;  continue present plan and medications.  Please keep a blood pressure diary.  Please call with results [phone] or sent them through My Chart.  Please have blood work today.  Follow up in 2 months with Dr Antoine Poche.

## 2013-07-22 ENCOUNTER — Encounter: Payer: Self-pay | Admitting: Physical Medicine & Rehabilitation

## 2013-07-24 ENCOUNTER — Other Ambulatory Visit: Payer: Self-pay | Admitting: Internal Medicine

## 2013-08-06 ENCOUNTER — Observation Stay (HOSPITAL_COMMUNITY)
Admission: EM | Admit: 2013-08-06 | Discharge: 2013-08-08 | Disposition: A | Discharge status: Home / Self Care | Payer: Medicare Other | Attending: Internal Medicine | Admitting: Internal Medicine

## 2013-08-06 ENCOUNTER — Encounter (HOSPITAL_COMMUNITY): Payer: Self-pay | Admitting: *Deleted

## 2013-08-06 ENCOUNTER — Emergency Department (HOSPITAL_COMMUNITY): Payer: Medicare Other

## 2013-08-06 DIAGNOSIS — R55 Syncope and collapse: Secondary | ICD-10-CM

## 2013-08-06 DIAGNOSIS — E039 Hypothyroidism, unspecified: Secondary | ICD-10-CM

## 2013-08-06 DIAGNOSIS — I480 Paroxysmal atrial fibrillation: Secondary | ICD-10-CM

## 2013-08-06 DIAGNOSIS — I10 Essential (primary) hypertension: Secondary | ICD-10-CM

## 2013-08-06 DIAGNOSIS — R5381 Other malaise: Secondary | ICD-10-CM

## 2013-08-06 DIAGNOSIS — Z8546 Personal history of malignant neoplasm of prostate: Secondary | ICD-10-CM | POA: Insufficient documentation

## 2013-08-06 DIAGNOSIS — I129 Hypertensive chronic kidney disease with stage 1 through stage 4 chronic kidney disease, or unspecified chronic kidney disease: Secondary | ICD-10-CM | POA: Insufficient documentation

## 2013-08-06 DIAGNOSIS — J90 Pleural effusion, not elsewhere classified: Secondary | ICD-10-CM | POA: Insufficient documentation

## 2013-08-06 DIAGNOSIS — R42 Dizziness and giddiness: Secondary | ICD-10-CM | POA: Insufficient documentation

## 2013-08-06 DIAGNOSIS — I4891 Unspecified atrial fibrillation: Secondary | ICD-10-CM | POA: Insufficient documentation

## 2013-08-06 DIAGNOSIS — I252 Old myocardial infarction: Secondary | ICD-10-CM | POA: Insufficient documentation

## 2013-08-06 DIAGNOSIS — Z79899 Other long term (current) drug therapy: Secondary | ICD-10-CM | POA: Insufficient documentation

## 2013-08-06 DIAGNOSIS — Z8673 Personal history of transient ischemic attack (TIA), and cerebral infarction without residual deficits: Secondary | ICD-10-CM | POA: Insufficient documentation

## 2013-08-06 DIAGNOSIS — E1169 Type 2 diabetes mellitus with other specified complication: Secondary | ICD-10-CM | POA: Insufficient documentation

## 2013-08-06 DIAGNOSIS — F3289 Other specified depressive episodes: Secondary | ICD-10-CM | POA: Insufficient documentation

## 2013-08-06 DIAGNOSIS — M353 Polymyalgia rheumatica: Secondary | ICD-10-CM | POA: Insufficient documentation

## 2013-08-06 DIAGNOSIS — K219 Gastro-esophageal reflux disease without esophagitis: Secondary | ICD-10-CM | POA: Insufficient documentation

## 2013-08-06 DIAGNOSIS — F329 Major depressive disorder, single episode, unspecified: Secondary | ICD-10-CM | POA: Insufficient documentation

## 2013-08-06 DIAGNOSIS — I2510 Atherosclerotic heart disease of native coronary artery without angina pectoris: Secondary | ICD-10-CM | POA: Insufficient documentation

## 2013-08-06 DIAGNOSIS — G4733 Obstructive sleep apnea (adult) (pediatric): Secondary | ICD-10-CM | POA: Insufficient documentation

## 2013-08-06 DIAGNOSIS — I951 Orthostatic hypotension: Principal | ICD-10-CM | POA: Insufficient documentation

## 2013-08-06 DIAGNOSIS — N184 Chronic kidney disease, stage 4 (severe): Secondary | ICD-10-CM

## 2013-08-06 DIAGNOSIS — W19XXXA Unspecified fall, initial encounter: Secondary | ICD-10-CM | POA: Insufficient documentation

## 2013-08-06 DIAGNOSIS — R531 Weakness: Secondary | ICD-10-CM

## 2013-08-06 LAB — CBC WITH DIFFERENTIAL/PLATELET
HCT: 40.6 % (ref 39.0–52.0)
Hemoglobin: 13.4 g/dL (ref 13.0–17.0)
Lymphocytes Relative: 13 % (ref 12–46)
Lymphs Abs: 1.7 10*3/uL (ref 0.7–4.0)
MCHC: 33 g/dL (ref 30.0–36.0)
Monocytes Absolute: 1 10*3/uL (ref 0.1–1.0)
Monocytes Relative: 8 % (ref 3–12)
Neutro Abs: 9.7 10*3/uL — ABNORMAL HIGH (ref 1.7–7.7)
Neutrophils Relative %: 77 % (ref 43–77)
RBC: 4.04 MIL/uL — ABNORMAL LOW (ref 4.22–5.81)
WBC: 12.7 10*3/uL — ABNORMAL HIGH (ref 4.0–10.5)

## 2013-08-06 LAB — URINALYSIS, ROUTINE W REFLEX MICROSCOPIC
Bilirubin Urine: NEGATIVE
Glucose, UA: NEGATIVE mg/dL
Ketones, ur: NEGATIVE mg/dL
Nitrite: NEGATIVE
Specific Gravity, Urine: 1.023 (ref 1.005–1.030)
pH: 6 (ref 5.0–8.0)

## 2013-08-06 LAB — BASIC METABOLIC PANEL
BUN: 21 mg/dL (ref 6–23)
CO2: 28 mEq/L (ref 19–32)
Chloride: 102 mEq/L (ref 96–112)
Creatinine, Ser: 1.32 mg/dL (ref 0.50–1.35)
GFR calc Af Amer: 54 mL/min — ABNORMAL LOW (ref >90)
Glucose, Bld: 107 mg/dL — ABNORMAL HIGH (ref 70–99)
Potassium: 4.5 mEq/L (ref 3.5–5.1)

## 2013-08-06 LAB — POCT I-STAT TROPONIN I

## 2013-08-06 MED ORDER — SODIUM CHLORIDE 0.9 % IJ SOLN
3.0000 mL | INTRAMUSCULAR | Status: DC | PRN
  Administered 2013-08-08: 3 mL via INTRAVENOUS

## 2013-08-06 MED ORDER — SODIUM CHLORIDE 0.9 % IV SOLN: 250.0000 mL | INTRAVENOUS | Status: DC | PRN

## 2013-08-06 MED ORDER — LEVOTHYROXINE SODIUM 137 MCG PO TABS
137.0000 ug | ORAL_TABLET | Freq: Every day | ORAL | Status: DC
  Administered 2013-08-07 – 2013-08-08 (×2): 137 ug via ORAL
  Filled 2013-08-06 (×3): qty 1

## 2013-08-06 MED ORDER — TRAMADOL HCL 50 MG PO TABS
50.0000 mg | ORAL_TABLET | Freq: Four times a day (QID) | ORAL | Status: DC | PRN
  Administered 2013-08-07: 50 mg via ORAL
  Filled 2013-08-06: qty 1

## 2013-08-06 MED ORDER — DOCUSATE SODIUM 100 MG PO CAPS
100.0000 mg | ORAL_CAPSULE | Freq: Two times a day (BID) | ORAL | Status: DC
  Administered 2013-08-06 – 2013-08-08 (×4): 100 mg via ORAL
  Filled 2013-08-06 (×5): qty 1

## 2013-08-06 MED ORDER — VITAMIN C 500 MG PO TABS
500.0000 mg | ORAL_TABLET | Freq: Every day | ORAL | Status: DC
  Administered 2013-08-06 – 2013-08-07 (×2): 500 mg via ORAL
  Filled 2013-08-06 (×3): qty 1

## 2013-08-06 MED ORDER — TRAZODONE HCL 50 MG PO TABS
50.0000 mg | ORAL_TABLET | Freq: Every day | ORAL | Status: DC
  Administered 2013-08-06 – 2013-08-07 (×2): 50 mg via ORAL
  Filled 2013-08-06 (×3): qty 1

## 2013-08-06 MED ORDER — HYOSCYAMINE SULFATE 0.125 MG PO TABS
0.1250 mg | ORAL_TABLET | Freq: Four times a day (QID) | ORAL | Status: DC | PRN
  Filled 2013-08-06: qty 1

## 2013-08-06 MED ORDER — CLOPIDOGREL BISULFATE 75 MG PO TABS
75.0000 mg | ORAL_TABLET | Freq: Every day | ORAL | Status: DC
  Administered 2013-08-07 – 2013-08-08 (×2): 75 mg via ORAL
  Filled 2013-08-06 (×2): qty 1

## 2013-08-06 MED ORDER — ALBUTEROL SULFATE HFA 108 (90 BASE) MCG/ACT IN AERS: 2.0000 | INHALATION_SPRAY | Freq: Four times a day (QID) | RESPIRATORY_TRACT | Status: DC | PRN

## 2013-08-06 MED ORDER — SERTRALINE HCL 100 MG PO TABS
100.0000 mg | ORAL_TABLET | Freq: Every day | ORAL | Status: DC
  Administered 2013-08-06 – 2013-08-07 (×2): 100 mg via ORAL
  Filled 2013-08-06 (×3): qty 1

## 2013-08-06 MED ORDER — DICLOFENAC SODIUM 1 % TD GEL
2.0000 g | Freq: Four times a day (QID) | TRANSDERMAL | Status: DC
  Administered 2013-08-06 – 2013-08-08 (×6): 2 g via TOPICAL
  Filled 2013-08-06: qty 100

## 2013-08-06 MED ORDER — SIMVASTATIN 20 MG PO TABS
20.0000 mg | ORAL_TABLET | Freq: Every day | ORAL | Status: DC
  Administered 2013-08-07: 20 mg via ORAL
  Filled 2013-08-06 (×2): qty 1

## 2013-08-06 MED ORDER — HYDRALAZINE HCL 25 MG PO TABS
25.0000 mg | ORAL_TABLET | Freq: Two times a day (BID) | ORAL | Status: DC
  Administered 2013-08-06: 25 mg via ORAL
  Filled 2013-08-06 (×3): qty 1

## 2013-08-06 MED ORDER — FAMOTIDINE 20 MG PO TABS
20.0000 mg | ORAL_TABLET | Freq: Two times a day (BID) | ORAL | Status: DC
  Administered 2013-08-06 – 2013-08-08 (×4): 20 mg via ORAL
  Filled 2013-08-06 (×5): qty 1

## 2013-08-06 MED ORDER — FOLIC ACID 1 MG PO TABS
1.0000 mg | ORAL_TABLET | Freq: Every day | ORAL | Status: DC
  Administered 2013-08-07 – 2013-08-08 (×2): 1 mg via ORAL
  Filled 2013-08-06 (×2): qty 1

## 2013-08-06 MED ORDER — SODIUM CHLORIDE 0.9 % IJ SOLN
3.0000 mL | Freq: Two times a day (BID) | INTRAMUSCULAR | Status: DC
  Administered 2013-08-06 – 2013-08-07 (×2): 3 mL via INTRAVENOUS

## 2013-08-06 MED ORDER — HEPARIN SODIUM (PORCINE) 5000 UNIT/ML IJ SOLN
5000.0000 [IU] | Freq: Three times a day (TID) | INTRAMUSCULAR | Status: DC
  Administered 2013-08-07 – 2013-08-08 (×5): 5000 [IU] via SUBCUTANEOUS
  Filled 2013-08-06 (×10): qty 1

## 2013-08-06 MED ORDER — SODIUM CHLORIDE 0.9 % IJ SOLN
3.0000 mL | Freq: Two times a day (BID) | INTRAMUSCULAR | Status: DC
  Administered 2013-08-06 – 2013-08-08 (×3): 3 mL via INTRAVENOUS

## 2013-08-06 MED ORDER — VITAMIN D3 25 MCG (1000 UNIT) PO TABS
1000.0000 [IU] | ORAL_TABLET | Freq: Every day | ORAL | Status: DC
  Administered 2013-08-06 – 2013-08-07 (×2): 1000 [IU] via ORAL
  Filled 2013-08-06 (×3): qty 1

## 2013-08-06 NOTE — ED Provider Notes (Signed)
Medical screening examination/treatment/procedure(s) were conducted as a shared visit with non-physician practitioner(s) and myself.  I personally evaluated the patient during the encounter and agree with plan and physical exam.  Pt with near syncopal event today.  Has cardiac history. No neuro deficits.  Will admit to medicine.  Layla Maw Henrry Feil, DO 08/06/13 2333

## 2013-08-06 NOTE — ED Provider Notes (Signed)
CSN: [id number]     Arrival date & time 08/06/13  1611 History     First MD Initiated Contact with Patient 08/06/13 1617     Chief Complaint  Patient presents with  . Fall   (Consider location/radiation/quality/duration/timing/severity/associated sxs/prior Treatment) HPI Comments: Patient with history of diastolic CHF, paroxysmal afib, CAD s/p stenting, 'light strokes' without sequelae -- presents with complaint of near syncopal episode. Patient was walking in his house when he felt like he was going to black out. Patient states that he had episodes similar in the past that were not as bad as today. He did not have full syncope. Patient was walking without his walker at that time. He states that he went on to the floor slowly and was assisted by a caregiver at home. He did not hit his head. He denies neck pain. His only physical complaint is bruising to his left forearm. Patient recently saw his cardiologist for her generalized weakness. He states that he has felt weak for several weeks. No medication changes. Onset of symptoms acute. Course is resolved. Nothing makes symptoms better or worse.  Patient is a 79 y.o. male presenting with fall. The history is provided by the patient and medical records.  Fall Associated symptoms include weakness. Pertinent negatives include no chest pain, congestion, fever, headaches, nausea, neck pain, numbness, rash or vomiting.    Past Medical History  Diagnosis Date  . CAD (coronary artery disease)     s/p NSTEMI and BMS stent diagonal07/09;  Lexiscan Myoview 9/13:  EF 62%, no ischemia  . AS (aortic stenosis)     a.  Echo 2009 mean gradient 9mm HG. AVA 2.18;  b.  Echo 4/11 mild AS mean gradient 10mm HG;  c.  Echo 04/2011: Mild LVH, EF 55-60%, grade 1 diastolic dysfunction, mild aortic stenosis, mean gradient 14, mild LAE;  d. Echo 9/13: mod LVH, EF 50-55%, mild to mod AS, mean gradient 17 mmHg  . AF (paroxysmal atrial fibrillation)     Holter monitor 2.5 sec  pauses  . Fatigue     CPX 11/09 VO2  14.4 (75% predicte)d, slope 35.  O2 pulse normal.  VO2 corrected for body weight 17.6.  Marland Kitchen Hypothyroidism   . Hyperlipidemia   . History of prostate cancer   . Allergic rhinitis   . DM (diabetes mellitus)     type II diet controlled  . Shingles   . Left carotid stenosis   . Vitamin B 12 deficiency   . Anemia   . Osteoarthritis   . Nephrolithiasis   . Obesity   . OSA (obstructive sleep apnea)     AHI 15 PSG 09/06/08  . Hx of colonoscopy   . Stroke   . Anxiety and depression   . PMR (polymyalgia rheumatica) 05/29/2012  . Hypogonadism male 05/29/2012  . Hyperparathyroidism 05/29/2012  . TIA (transient ischemic attack) 05/29/2012  . CKD (chronic kidney disease) stage 4, GFR 15-29 ml/min 05/29/2012  . COPD (chronic obstructive pulmonary disease) 05/29/2012  . Myocardial infarction   . Cancer     prostate ca history   Past Surgical History  Procedure Laterality Date  . Appendectomy    . Hernia repair    . Right carpal tunnel release    . Lumbar spine surgery    . Cystectomy      neck  . Prostatectomy    . Vasectomy    . Total knee arthroplasty      right  . Cholecystectomy  Family History  Problem Relation Age of Onset  . Stroke Mother 61  . Coronary artery disease      father, brother, sister  . Alcohol abuse Father 19   History  Substance Use Topics  . Smoking status: Former Smoker -- 0.50 packs/day for 25 years    Types: Cigarettes    Quit date: 12/25/1958  . Smokeless tobacco: Former Neurosurgeon     Comment: quit in 1950s  . Alcohol Use: No    Review of Systems  Constitutional: Negative for fever.  HENT: Negative for congestion, rhinorrhea, neck pain, neck stiffness, dental problem and sinus pressure.   Eyes: Negative for photophobia, discharge, redness and visual disturbance.  Respiratory: Negative for shortness of breath.   Cardiovascular: Negative for chest pain.  Gastrointestinal: Negative for nausea and vomiting.   Musculoskeletal: Negative for gait problem.  Skin: Negative for rash.  Neurological: Positive for syncope (near-syncope), weakness and light-headedness. Negative for speech difficulty, numbness and headaches.  Psychiatric/Behavioral: Negative for confusion.    Allergies  Bactrim and Horse-derived products  Home Medications   Current Outpatient Rx  Name  Route  Sig  Dispense  Refill  . albuterol (PROVENTIL HFA;VENTOLIN HFA) 108 (90 BASE) MCG/ACT inhaler   Inhalation   Inhale 2 puffs into the lungs every 6 (six) hours as needed for wheezing or shortness of breath.   1 Inhaler   5   . Ascorbic Acid (VITAMIN C) 500 MG tablet   Oral   Take 500 mg by mouth daily.           . cholecalciferol (VITAMIN D) 1000 UNITS tablet   Oral   Take 1,000 Units by mouth daily.         . clopidogrel (PLAVIX) 75 MG tablet   Oral   Take 1 tablet (75 mg total) by mouth daily.   90 tablet   3   . diclofenac sodium (VOLTAREN) 1 % GEL   Topical   Apply 2 g topically 4 (four) times daily.   3 Tube   1   . docusate sodium 100 MG CAPS   Oral   Take 100 mg by mouth 2 (two) times daily.   10 capsule      . famotidine (PEPCID) 20 MG tablet   Oral   Take 1 tablet (20 mg total) by mouth 2 (two) times daily.   180 tablet   3   . folic acid (FOLVITE) 1 MG tablet   Oral   Take 1 mg by mouth daily.         . hydrALAZINE (APRESOLINE) 25 MG tablet      TAKE ONE-HALF TABLET BY MOUTH TWICE DAILY   90 tablet   0   . hyoscyamine (LEVSIN, ANASPAZ) 0.125 MG tablet   Oral   Take 0.125 mg by mouth 4 (four) times daily as needed. Gas pain.         Marland Kitchen levothyroxine (SYNTHROID, LEVOTHROID) 137 MCG tablet   Oral   Take 1 tablet (137 mcg total) by mouth daily.   90 tablet   3   . methotrexate (RHEUMATREX) 2.5 MG tablet   Oral   Take 10 mg by mouth once a week. Takes 4 tablets weekly on  Sunday.   Caution:Chemotherapy. Protect from light.         . pravastatin (PRAVACHOL) 40 MG tablet       TAKE ONE TABLET BY MOUTH EVERY DAY   30 tablet   5   .  sertraline (ZOLOFT) 100 MG tablet   Oral   Take 1 tablet (100 mg total) by mouth daily.   90 tablet   3   . traMADol (ULTRAM) 50 MG tablet   Oral   Take 1 tablet (50 mg total) by mouth every 6 (six) hours as needed for pain.   120 tablet   2   . traZODone (DESYREL) 100 MG tablet   Oral   Take 0.5 tablets (50 mg total) by mouth at bedtime.   90 tablet   1    BP 163/84  Pulse 81  Temp(Src) 98.6 F (37 C) (Oral)  Resp 21  SpO2 96% Physical Exam  Nursing note and vitals reviewed. Constitutional: He is oriented to person, place, and time. He appears well-developed and well-nourished.  HENT:  Head: Normocephalic and atraumatic.  Right Ear: Tympanic membrane, external ear and ear canal normal.  Left Ear: Tympanic membrane, external ear and ear canal normal.  Nose: Nose normal.  Mouth/Throat: Uvula is midline, oropharynx is clear and moist and mucous membranes are normal.  Eyes: EOM and lids are normal. Pupils are equal, round, and reactive to light.  L conjunctiva inflamed  Neck: Normal range of motion. Neck supple.  No neck pain  Cardiovascular: Normal rate and regular rhythm.   Pulmonary/Chest: Effort normal and breath sounds normal.  Abdominal: Soft. There is no tenderness.  Musculoskeletal: Normal range of motion.       Cervical back: He exhibits normal range of motion, no tenderness and no bony tenderness.       Thoracic back: He exhibits no tenderness and no bony tenderness.       Lumbar back: He exhibits no tenderness and no bony tenderness.  Neurological: He is alert and oriented to person, place, and time. He has normal strength and normal reflexes. No cranial nerve deficit or sensory deficit. He exhibits normal muscle tone. He displays a negative Romberg sign. Coordination normal. GCS eye subscore is 4. GCS verbal subscore is 5. GCS motor subscore is 6.  Skin: Skin is warm and dry.  Generalized bruising  of forearms  Psychiatric: He has a normal mood and affect.    ED Course   Procedures (including critical care time)  Labs Reviewed  CBC WITH DIFFERENTIAL - Abnormal; Notable for the following:    WBC 12.7 (*)    RBC 4.04 (*)    MCV 100.5 (*)    Neutro Abs 9.7 (*)    All other components within normal limits  BASIC METABOLIC PANEL - Abnormal; Notable for the following:    Glucose, Bld 107 (*)    GFR calc non Af Amer 46 (*)    GFR calc Af Amer 54 (*)    All other components within normal limits  URINALYSIS, ROUTINE W REFLEX MICROSCOPIC  POCT I-STAT TROPONIN I   Dg Chest 2 View  08/06/2013   *RADIOLOGY REPORT*  Clinical Data: Fall.  CHEST - 2 VIEW  Comparison: 05/02/2013 and 12/05/2012  Findings: Examination demonstrates evidence of a small right pleural effusion which appears to be a chronic finding.  There is mild hazy density in the right mid to lower lung likely chronic. Left lung is clear.  Cardiomediastinal silhouette and remainder of the exam is unchanged.  IMPRESSION: Chronic changes in the right mid to lower lung with small chronic right effusion.   Original Report Authenticated By: Elberta Fortis, M.D.   1. Near syncope     4:27 PM Patient seen and examined. Work-up  initiated. Medications ordered.   Vital signs reviewed and are as follows: Filed Vitals:   08/06/13 1618  BP: 163/84  Pulse: 81  Temp: 98.6 F (37 C)  Resp: 21    Date: 08/06/2013  Rate: 80  Rhythm: normal sinus rhythm  QRS Axis: normal  Intervals: normal  ST/T Wave abnormalities: normal  Conduction Disutrbances:none  Narrative Interpretation:   Old EKG Reviewed: unchanged from 04/16/2013  7:33 PM Patient informed of results. D/w and seen by Dr. Elesa Massed. Will admit. D/w Dr. Clent Ridges of [facility].     MDM  79yo h/o CHF, afib, near syncope -- near syncopal episode. Admit given high risk for arrhythmia.    Renne Crigler, PA-C 08/06/13 1935

## 2013-08-06 NOTE — H&P (Signed)
[facility] History and Physical  LYNK MARTI WGN:[id number] DOB: 03-Aug-1923 DOA: 08/06/2013  Referring physician: EDP PCP: [name], MD  Specialists: none  Chief Complaint: syncope  HPI: [name] is a 79 y.o. male with pmh of CAD, PAF, cerebrovascular disease, and polymyalgia rheumatica presents today after a near syncopal episode.  He reports feeling poorly for the past week with decreased energy level.  This morning he was nauseated in the morning, but was able to eat oatmeal.  He did not eat for the remainder of the day. This afternoon when walking across the room he noticed his vision darkening, he called out to his care giver and his legs gave out. He did not loose consciousness and there was no confusion during or after the event.  His care giver was able to catch him, he did not fall to the ground or sustain any injury from the fall.  He has had several similar episodes over the past few weeks.  He denies any vomiting or diarrhea, abdominal pain, chest pain, palpitations or shortness of breath.  He has had a mild headache for the past few days.  He is accompanied at the time of admission by his son and daughter.  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   Past Medical History  Diagnosis Date   CAD (coronary artery disease)     s/p NSTEMI and BMS stent diagonal07/09;  Lexiscan Myoview 9/13:  EF 62%, no ischemia   AS (aortic stenosis)     a.  Echo 2009 mean gradient 9mm HG. AVA 2.18;  b.  Echo 4/11 mild AS mean gradient 10mm HG;  c.  Echo 04/2011: Mild LVH, EF 55-60%, grade 1 diastolic dysfunction, mild aortic stenosis, mean gradient 14, mild LAE;   d. Echo 9/13: mod LVH, EF 50-55%, mild to mod AS, mean gradient 17 mmHg   AF (paroxysmal atrial fibrillation)     Holter monitor 2.5 sec pauses   Fatigue     CPX 11/09 VO2  14.4 (75% predicte)d, slope 35.  O2 pulse normal.  VO2 corrected for body weight 17.6.   Hypothyroidism    Hyperlipidemia    History of prostate cancer    Allergic rhinitis    DM (diabetes mellitus)     type II diet controlled   Shingles    Left carotid stenosis    Vitamin B 12 deficiency    Anemia    Osteoarthritis    Nephrolithiasis    Obesity    OSA (obstructive sleep apnea)     AHI 15 PSG 09/06/08   Hx of colonoscopy    Stroke    Anxiety and depression    PMR (polymyalgia rheumatica) 05/29/2012   Hypogonadism male 05/29/2012   Hyperparathyroidism 05/29/2012   TIA (transient ischemic attack) 05/29/2012   CKD (chronic kidney disease) stage 4, GFR 15-29 ml/min 05/29/2012   COPD (chronic obstructive pulmonary disease) 05/29/2012   Myocardial infarction    Cancer     prostate ca history   Past Surgical History  Procedure Laterality Date   Appendectomy     Hernia repair     Right carpal tunnel release     Lumbar spine surgery     Cystectomy      neck   Prostatectomy  Vasectomy     Total knee arthroplasty      right   Cholecystectomy     Social History:  reports that he quit smoking about 54 years ago. His smoking use included Cigarettes. He has a 12.5 pack-year smoking history. He quit smokeless tobacco use about 43 years ago. His smokeless tobacco use included Snuff and Chew. He reports that he does not drink alcohol or use illicit drugs.  He currently lives at home with his wife.  He drives occasionally.  He walks with a walker.  Allergies  Allergen Reactions   Bactrim [Sulfamethoxazole W-Trimethoprim] Other (See Comments)    hallucinations   Horse-Derived Products Hives    Family History  Problem Relation Age of Onset   Stroke Mother 33   Coronary artery  disease      father, brother, sister   Alcohol abuse Father 96     Prior to Admission medications   Medication Sig Start Date End Date Taking? Authorizing Provider  albuterol (PROVENTIL HFA;VENTOLIN HFA) 108 (90 BASE) MCG/ACT inhaler Inhale 2 puffs into the lungs every 6 (six) hours as needed for wheezing or shortness of breath. 11/29/12  Yes [name], MD  Ascorbic Acid (VITAMIN C) 500 MG tablet Take 500 mg by mouth at bedtime.    Yes Historical Provider, MD  cholecalciferol (VITAMIN D) 1000 UNITS tablet Take 1,000 Units by mouth at bedtime.    Yes Historical Provider, MD  clopidogrel (PLAVIX) 75 MG tablet Take 1 tablet (75 mg total) by mouth daily. 09/12/12  Yes [name], MD  diclofenac sodium (VOLTAREN) 1 % GEL Apply 2 g topically 4 (four) times daily. 04/30/13  Yes Evlyn Kanner Love, PA-C  docusate sodium 100 MG CAPS Take 100 mg by mouth 2 (two) times daily. 12/30/12  Yes Leroy Sea, MD  famotidine (PEPCID) 20 MG tablet Take 1 tablet (20 mg total) by mouth 2 (two) times daily. 07/10/13  Yes [name], MD  folic acid (FOLVITE) 1 MG tablet Take 1 mg by mouth daily.   Yes Historical Provider, MD  hydrALAZINE (APRESOLINE) 25 MG tablet TAKE ONE-HALF TABLET BY MOUTH TWICE DAILY 07/24/13  Yes [name], MD  hyoscyamine (LEVSIN, ANASPAZ) 0.125 MG tablet Take 0.125 mg by mouth 4 (four) times daily as needed. Gas pain.   Yes Historical Provider, MD  levothyroxine (SYNTHROID, LEVOTHROID) 137 MCG tablet Take 1 tablet (137 mcg total) by mouth daily. 09/12/12  Yes [name], MD  methotrexate (RHEUMATREX) 2.5 MG tablet Take 10 mg by mouth once a week. Takes 4 tablets weekly on Wednesday.   Caution:Chemotherapy. Protect from light.   Yes Historical Provider, MD  pravastatin (PRAVACHOL) 40 MG tablet TAKE ONE TABLET BY MOUTH EVERY DAY 01/03/13  Yes [name], MD  sertraline (ZOLOFT) 100 MG tablet Take 100 mg by mouth at bedtime.   Yes Historical Provider, MD  traMADol (ULTRAM) 50 MG tablet Take 1  tablet (50 mg total) by mouth every 6 (six) hours as needed for pain. 05/02/13  Yes [name], MD  traZODone (DESYREL) 100 MG tablet Take 0.5 tablets (50 mg total) by mouth at bedtime. 06/16/13  Yes [name], MD   Physical Exam: Filed Vitals:   08/06/13 2111  BP: 168/85  Pulse: 77  Temp: 98.4 F (36.9 C)  Resp: 18     General:  Resting comfortably in bed. No distress  Eyes: Right pupil is small and fixed (chronic).  Left pupil  is reactive.  EOMI. Left conjunctiva injected.  ENT: MMM, fair dentition, oropharynx is clear  Neck: supple, no JVD, no bruit  Cardiovascular: RRR, distant, 2/6 SEM  Respiratory: CTAB with good resps  Abdomen: BS+, soft, non tender, non distended  Skin: ecchymosis over both forearms, no rash, no open lesion  Psychiatric: calm, appropriate  Neurologic: AOx3, CN grossly intact (exception of right eye), non focal neurological exam  Labs on Admission:  Basic Metabolic Panel:  Recent Labs Lab 08/06/13 1640  NA 135  K 4.5  CL 102  CO2 28  GLUCOSE 107*  BUN 21  CREATININE 1.32  CALCIUM 8.7   Liver Function Tests: No results found for this basename: AST, ALT, ALKPHOS, BILITOT, PROT, ALBUMIN,  in the last 168 hours No results found for this basename: LIPASE, AMYLASE,  in the last 168 hours No results found for this basename: AMMONIA,  in the last 168 hours CBC:  Recent Labs Lab 08/06/13 1640  WBC 12.7*  NEUTROABS 9.7*  HGB 13.4  HCT 40.6  MCV 100.5*  PLT 261   Cardiac Enzymes: No results found for this basename: CKTOTAL, CKMB, CKMBINDEX, TROPONINI,  in the last 168 hours  BNP (last 3 results)  Recent Labs  12/24/12 1131 12/24/12 1518 04/16/13 2327  PROBNP 2861.0* 2788.0* 1599.0*   CBG: No results found for this basename: GLUCAP,  in the last 168 hours  Radiological Exams on Admission: Dg Chest 2 View  08/06/2013   *RADIOLOGY REPORT*  Clinical Data: Fall.  CHEST - 2 VIEW  Comparison: 05/02/2013 and 12/05/2012   Findings: Examination demonstrates evidence of a small right pleural effusion which appears to be a chronic finding.  There is mild hazy density in the right mid to lower lung likely chronic. Left lung is clear.  Cardiomediastinal silhouette and remainder of the exam is unchanged.  IMPRESSION: Chronic changes in the right mid to lower lung with small chronic right effusion.   Original Report Authenticated By: Elberta Fortis, M.D.    EKG: Independently reviewed. NSR, no acute change  Assessment/Plan 1. Syncope: near syncopal event this afternoon with unclear etiology.  Will admit to OBS with telemetry to monitor for possible arrhythmia. He does have a history of PAF, but is not in a-fib at this time.  On presentation blood glucose is slightly low, and he does admit to low intake today, presyncope may have been related to low blood sugar or transient hypotension.  He has had no chest pain or shortness of breath to suggest ACS or CHF exacerbation.  Lungs are clear on exam, CXR is stable. 2. History of CAD:  No signs of ACS at this time.  Continue on tele.  Continue statin, plavix. 3. HTN: continue hydralazine. Fair control at this time. 4. Hypothyroidism: continue synthroid. Check TSH 5. GERD: continue pepcid 6. Polymyalgia:  Continue folate, voltarin, holding methotrexate as he takes this once a week and has taken it today 7. Depression: continue sertraline 8. Prophylaxix: heparin 9. Dispo: admit to observation status.  Anticipate dc in 24 hours.   Time spent: 345 minutes   Gale Journey [facility] Pager [phone]  If 7PM-7AM, please contact night-coverage [email or web address] Password [facility] 08/06/2013, 9:13 PM

## 2013-08-06 NOTE — ED Notes (Signed)
Room 1431 assigned@2027 

## 2013-08-06 NOTE — ED Notes (Signed)
Pt is supposed to use a walker at all times for his arthritis but was not using it so he fell today. Did not hit anything. No LOC or c/o pain. Also worried that his BP meter at home had been reading high lately.

## 2013-08-07 DIAGNOSIS — R55 Syncope and collapse: Secondary | ICD-10-CM

## 2013-08-07 DIAGNOSIS — I2510 Atherosclerotic heart disease of native coronary artery without angina pectoris: Secondary | ICD-10-CM

## 2013-08-07 DIAGNOSIS — F3289 Other specified depressive episodes: Secondary | ICD-10-CM

## 2013-08-07 DIAGNOSIS — R5381 Other malaise: Secondary | ICD-10-CM

## 2013-08-07 DIAGNOSIS — M353 Polymyalgia rheumatica: Secondary | ICD-10-CM

## 2013-08-07 DIAGNOSIS — N184 Chronic kidney disease, stage 4 (severe): Secondary | ICD-10-CM

## 2013-08-07 DIAGNOSIS — I951 Orthostatic hypotension: Principal | ICD-10-CM

## 2013-08-07 DIAGNOSIS — E039 Hypothyroidism, unspecified: Secondary | ICD-10-CM

## 2013-08-07 DIAGNOSIS — I4891 Unspecified atrial fibrillation: Secondary | ICD-10-CM

## 2013-08-07 DIAGNOSIS — F329 Major depressive disorder, single episode, unspecified: Secondary | ICD-10-CM

## 2013-08-07 DIAGNOSIS — I10 Essential (primary) hypertension: Secondary | ICD-10-CM

## 2013-08-07 LAB — COMPREHENSIVE METABOLIC PANEL
ALT: 11 U/L (ref 0–53)
Albumin: 3.1 g/dL — ABNORMAL LOW (ref 3.5–5.2)
Alkaline Phosphatase: 76 U/L (ref 39–117)
Potassium: 4 mEq/L (ref 3.5–5.1)
Sodium: 139 mEq/L (ref 135–145)
Total Protein: 6.3 g/dL (ref 6.0–8.3)

## 2013-08-07 LAB — CBC
HCT: 40.7 % (ref 39.0–52.0)
MCH: 32.4 pg (ref 26.0–34.0)
MCHC: 32.2 g/dL (ref 30.0–36.0)
MCV: 100.7 fL — ABNORMAL HIGH (ref 78.0–100.0)
Platelets: 278 10*3/uL (ref 150–400)
RDW: 14.4 % (ref 11.5–15.5)
WBC: 11.6 10*3/uL — ABNORMAL HIGH (ref 4.0–10.5)

## 2013-08-07 MED ORDER — SODIUM CHLORIDE 0.9 % IV BOLUS (SEPSIS)
500.0000 mL | Freq: Once | INTRAVENOUS | Status: AC
  Administered 2013-08-07: 500 mL via INTRAVENOUS

## 2013-08-07 NOTE — Evaluation (Signed)
Physical Therapy Evaluation Patient Details Name: [name] MRN: [id number] DOB: 07-11-1923 Today's Date: 08/07/2013 Time: 6295-2841 PT Time Calculation (min): 27 min  PT Assessment / Plan / Recommendation History of Present Illness  pt presents with hx of Syncopal events with near falls.    Clinical Impression  Pt orthostatic with mobility.  BP supine 127/59, sitting 117/56, standing 96/50, and after standing 2 mins 104/60.  Pt c/o dizziness in standing.  Pt moves well, but is limited by dizziness and orthostatic BP.  Feel once BP resolved, pt will demo safe mobility.  Will continue to follow to ensure Mod I.      PT Assessment  Patient needs continued PT services    Follow Up Recommendations  Home health PT    Does the patient have the potential to tolerate intense rehabilitation      Barriers to Discharge        Equipment Recommendations  None recommended by PT    Recommendations for Other Services     Frequency Min 3X/week    Precautions / Restrictions Precautions Precautions: Fall Restrictions Weight Bearing Restrictions: No   Pertinent Vitals/Pain Denies pain.        Mobility  Bed Mobility Bed Mobility: Supine to Sit;Sitting - Scoot to Edge of Bed Supine to Sit: 6: Modified independent (Device/Increase time) Sitting - Scoot to Edge of Bed: 6: Modified independent (Device/Increase time) Details for Bed Mobility Assistance: Needs increased time, but able to complete without A.   Transfers Transfers: Sit to Stand;Stand to Sit Sit to Stand: 6: Modified independent (Device/Increase time);With upper extremity assist;From bed Stand to Sit: 6: Modified independent (Device/Increase time);With upper extremity assist;To bed Details for Transfer Assistance: Demos good safety, but limited by dizziness in standing with orthostatic BP.   Ambulation/Gait Ambulation/Gait Assistance: Not tested (comment) Stairs: No Wheelchair Mobility Wheelchair Mobility: No     Exercises     PT Diagnosis: Difficulty walking  PT Problem List: Decreased activity tolerance;Decreased balance;Decreased mobility;Decreased knowledge of use of DME;Cardiopulmonary status limiting activity PT Treatment Interventions: DME instruction;Gait training;Functional mobility training;Therapeutic activities;Therapeutic exercise;Balance training;Patient/family education     PT Goals(Current goals can be found in the care plan section) Acute Rehab PT Goals Patient Stated Goal: Return home PT Goal Formulation: With patient Time For Goal Achievement: 08/21/13 Potential to Achieve Goals: Good  Visit Information  Last PT Received On: 08/07/13 Assistance Needed: +1 History of Present Illness: pt presents with hx of Syncopal events with near falls.         Prior Functioning  Home Living Family/patient expects to be discharged to:: Private residence Living Arrangements: Spouse/significant other;Other (Comment) (24hr CG) Available Help at Discharge: Personal care attendant;Available 24 hours/day Type of Home: House Home Access: Ramped entrance Home Layout: One level Home Equipment: Walker - 2 wheels Additional Comments: Wife has Alzheimer's.   Prior Function Level of Independence: Independent with assistive device(s) Communication Communication: HOH Dominant Hand: Right    Cognition  Cognition Arousal/Alertness: Awake/alert Behavior During Therapy: WFL for tasks assessed/performed Overall Cognitive Status: Within Functional Limits for tasks assessed    Extremity/Trunk Assessment Upper Extremity Assessment Upper Extremity Assessment: Overall WFL for tasks assessed Lower Extremity Assessment Lower Extremity Assessment: Overall WFL for tasks assessed   Balance Balance Balance Assessed: Yes Static Standing Balance Static Standing - Balance Support: Bilateral upper extremity supported;During functional activity Static Standing - Level of Assistance: 6: Modified  independent (Device/Increase time)  End of Session PT - End of Session Equipment Utilized During Treatment:  Gait belt Activity Tolerance:  (Limited by orthostatic BP) Patient left: in bed;with call bell/phone within reach Nurse Communication: Mobility status (BP)  GP Functional Assessment Tool Used: Clinical Judgement Functional Limitation: Mobility: Walking and moving around Mobility: Walking and Moving Around Current Status ([id number]): At least 1 percent but less than 20 percent impaired, limited or restricted Mobility: Walking and Moving Around Goal Status [phone]): 0 percent impaired, limited or restricted Mobility: Walking and Moving Around Discharge Status [phone]): 0 percent impaired, limited or restricted   Sunny Schlein, [place] [phone] 08/07/2013, 10:39 AM

## 2013-08-07 NOTE — Progress Notes (Signed)
[facility] PROGRESS NOTE  [name]:[id number] DOB: 1923-12-21 DOA: 08/06/2013 PCP: Don Barre, MD  Assessment/Plan: 1. Near syncope: near syncopal event the afternoon of admission secondary to hypoglycemia and orthostatic changes.  -telemetry has remained stable and w/o abnormalities. -patient denies CP or SOB -IVF's -hydralazine discontinue -PT recommending HHPT -cortisol WNL  2. History of CAD: No signs of ACS at this time. Continue on tele. Continue statin, plavix.   3. HTN: soft and with positive orthostatic changes. Will discontinue hydralazine. If antihypertensive agent is needed will use norvasc.   4. Hypothyroidism: continue synthroid. TSH WNL  5. GERD: continue pepcid   6. Polymyalgia: Continue folate, voltarin, holding methotrexate as he takes this once a week and has taken it today.   7. Depression: continue sertraline   8. DVT Prophylaxix: heparin   Code Status: Full Family Communication: no family at bedside Disposition Plan: home with [facility] when medically stable   Consultants:  PT  Procedures:  See below for x-ray reports  Antibiotics:  None   HPI/Subjective: Afebrile, no CP or SOB. Patient with positive orthostatic changes on his VS.  Objective: Filed Vitals:   08/07/13 1300  BP: 119/61  Pulse: 70  Temp: 98.5 F (36.9 C)  Resp: 17    Intake/Output Summary (Last 24 hours) at 08/07/13 1646 Last data filed at 08/07/13 0900  Gross per 24 hour  Intake    660 ml  Output    850 ml  Net   -190 ml   Filed Weights   08/06/13 2111  Weight: 86.955 kg (191 lb 11.2 oz)    Exam:   General:  Slightly dizzy when standing, but better overall  Cardiovascular: regular rate, positive SEM, no rubs or gallops  Respiratory: good air movement, no wheezing or crackles  Abdomen: soft, NT, ND, positive BS  Musculoskeletal: no Le edema, cyanosis or clubbing  Data Reviewed: Basic Metabolic Panel:  Recent Labs Lab 08/06/13 1640  08/07/13 0525  NA 135 139  K 4.5 4.0  CL 102 101  CO2 28 29  GLUCOSE 107* 99  BUN 21 19  CREATININE 1.32 1.29  CALCIUM 8.7 9.1   Liver Function Tests:  Recent Labs Lab 08/07/13 0525  AST 17  ALT 11  ALKPHOS 76  BILITOT 0.5  PROT 6.3  ALBUMIN 3.1*   CBC:  Recent Labs Lab 08/06/13 1640 08/07/13 0525  WBC 12.7* 11.6*  NEUTROABS 9.7*  --   HGB 13.4 13.1  HCT 40.6 40.7  MCV 100.5* 100.7*  PLT 261 278   BNP (last 3 results)  Recent Labs  12/24/12 1131 12/24/12 1518 04/16/13 2327  PROBNP 2861.0* 2788.0* 1599.0*    Studies: Dg Chest 2 View  08/06/2013   *RADIOLOGY REPORT*  Clinical Data: Fall.  CHEST - 2 VIEW  Comparison: 05/02/2013 and 12/05/2012  Findings: Examination demonstrates evidence of a small right pleural effusion which appears to be a chronic finding.  There is mild hazy density in the right mid to lower lung likely chronic. Left lung is clear.  Cardiomediastinal silhouette and remainder of the exam is unchanged.  IMPRESSION: Chronic changes in the right mid to lower lung with small chronic right effusion.   Original Report Authenticated By: Elberta Fortis, M.D.    Scheduled Meds: . cholecalciferol  1,000 Units Oral QHS  . clopidogrel  75 mg Oral Daily  . diclofenac sodium  2 g Topical QID  . docusate sodium  100 mg Oral BID  . famotidine  20  mg Oral BID  . folic acid  1 mg Oral Daily  . heparin  5,000 Units Subcutaneous Q8H  . levothyroxine  137 mcg Oral QAC breakfast  . sertraline  100 mg Oral QHS  . simvastatin  20 mg Oral q1800  . sodium chloride  3 mL Intravenous Q12H  . sodium chloride  3 mL Intravenous Q12H  . traZODone  50 mg Oral QHS  . vitamin C  500 mg Oral QHS    Time spent: >30 minutes    [name]  [facility] Pager [phone]. If 7PM-7AM, please contact night-coverage at [email or web address], password [facility] 08/07/2013, 4:46 PM  LOS: 1 day

## 2013-08-08 LAB — CBC
MCH: 33.2 pg (ref 26.0–34.0)
MCHC: 33.5 g/dL (ref 30.0–36.0)
MCV: 99 fL (ref 78.0–100.0)
Platelets: 279 10*3/uL (ref 150–400)
RDW: 14.3 % (ref 11.5–15.5)

## 2013-08-08 LAB — BASIC METABOLIC PANEL
CO2: 27 mEq/L (ref 19–32)
Calcium: 8.9 mg/dL (ref 8.4–10.5)
Creatinine, Ser: 1.32 mg/dL (ref 0.50–1.35)
GFR calc non Af Amer: 46 mL/min — ABNORMAL LOW (ref >90)
Sodium: 139 mEq/L (ref 135–145)

## 2013-08-08 MED ORDER — MECLIZINE HCL 25 MG PO TABS: 25.0000 mg | ORAL_TABLET | Freq: Three times a day (TID) | ORAL | Status: DC | PRN

## 2013-08-08 MED ORDER — FLUDROCORTISONE ACETATE 0.1 MG PO TABS: 0.1000 mg | ORAL_TABLET | Freq: Every day | ORAL | Status: DC

## 2013-08-08 NOTE — Progress Notes (Signed)
Physical Therapy Treatment Patient Details Name: [name] MRN: [id number] DOB: January 12, 1923 Today's Date: 08/08/2013 Time: 4098-1191 PT Time Calculation (min): 8 min  PT Assessment / Plan / Recommendation  History of Present Illness pt presents with hx of Syncopal events with near falls.     PT Comments   Progressing somewhat but mobility is limited by orthostatic BP-pt is symptomatic. During PT session, sitting-125/72, standing-82/49. Pt sat again and BP after sitting-108/60. Pt able to ambulate for ~15 before needing to sit down-after ambulation, BP 90/55. Discussed d/c plan, and pt states there is a 24 hour caregiver in his home that assists with his wife. Pt may need 24 hour supervision initially if orthostasis persists.   Follow Up Recommendations  Home health PT;Supervision/Assistance - 24 hour     Does the patient have the potential to tolerate intense rehabilitation     Barriers to Discharge        Equipment Recommendations  None recommended by PT    Recommendations for Other Services    Frequency Min 3X/week   Progress towards PT Goals Progress towards PT goals: Progressing toward goals  Plan Current plan remains appropriate    Precautions / Restrictions Precautions Precautions: Fall Restrictions Weight Bearing Restrictions: No   Pertinent Vitals/Pain No c/o pain    Mobility  Bed Mobility Details for Bed Mobility Assistance: pt sitting EOB Transfers Transfers: Sit to Stand;Stand to Sit Sit to Stand: 6: Modified independent (Device/Increase time);From bed Stand to Sit: To bed Ambulation/Gait Ambulation/Gait Assistance: 4: Min guard Ambulation Distance (Feet): 15 Feet Assistive device: Rolling walker Ambulation/Gait Assistance Details: Close guarding.  Gait Pattern: Step-through pattern    Exercises     PT Diagnosis:    PT Problem List:   PT Treatment Interventions:     PT Goals (current goals can now be found in the care plan section)    Visit  Information  Last PT Received On: 08/08/13 Assistance Needed: +1 History of Present Illness: pt presents with hx of Syncopal events with near falls.      Subjective Data      Cognition  Cognition Arousal/Alertness: Awake/alert Behavior During Therapy: WFL for tasks assessed/performed Overall Cognitive Status: Within Functional Limits for tasks assessed    Balance     End of Session PT - End of Session Activity Tolerance:  (Limited by orthostasis, dizziness) Patient left: in chair;with call bell/phone within reach   GP     Rebeca Alert, MPT Pager: [phone]

## 2013-08-08 NOTE — Progress Notes (Signed)
Patient discharged home discharge instructions given to patient and son understanding verbalized. No questions at this time.

## 2013-08-08 NOTE — Progress Notes (Addendum)
Met with Mr Knoll at bedside to discuss [facility] Care Management services based on referral from [facility] program. Mr Kaeser states he cares for his wife who is not well and reports he has "a lot going on" and does not want to be called and so forth at home. Therefore he declined [facility] Care Management. Gave him a [facility] Care Management brochure with the number to call in case he changes his mind in the future.  Raiford Noble, MSN-Ed, RN,BSN- [facility] [phone]

## 2013-08-09 NOTE — Discharge Summary (Signed)
Physician Discharge Summary  [name]:[id number] DOB: 09-02-1923 DOA: 08/06/2013  PCP: Don Barre, MD  Admit date: 08/06/2013 Discharge date: 08/08/2013  Time spent: >30 minutes  Recommendations for Outpatient Follow-up:  1. Reassess BP and adjust medications as needed; especially orthostatic vital signs and adjust florinef 2. BMET to follow electrolytes and kidney function 3. If further orthostasis present consider discontinue/exchanging sertraline.  Discharge Diagnoses:  Orthostatic hypotension HTN Near syncope PMR CAD Hypothyroidism GERD  Discharge Condition:stable and without complaints of CP or SOB. Patient with mild orthostatic changes at discharge, but decided to go home with family care and [facility] services. Started on florinef and meclizine, patient will follow with PCP in 5 days.  Diet recommendation: heart healthy diet  Filed Weights   08/06/13 2111  Weight: 86.955 kg (191 lb 11.2 oz)    History of present illness:  79 y.o. male with pmh of CAD, PAF, cerebrovascular disease, and polymyalgia rheumatica presents today after a near syncopal episode. He reports feeling poorly for the past week with decreased energy level. This morning he was nauseated in the morning, but was able to eat oatmeal. He did not eat for the remainder of the day. This afternoon when walking across the room he noticed his vision darkening, he called out to his care giver and his legs gave out. He did not loose consciousness and there was no confusion during or after the event. His care giver was able to catch him, he did not fall to the ground or sustain any injury from the fall. He has had several similar episodes over the past few weeks. He denies any vomiting or diarrhea, abdominal pain, chest pain, palpitations or shortness of breath. He has had a mild headache for the past few days. He is accompanied at the time of admission by his son and daughter.   Hospital Course:  1. Near syncope: near  syncopal event the afternoon of admission secondary to hypoglycemia and orthostatic changes.  -telemetry remained stable and w/o abnormalities.  -patient denies CP or SOB  -BP improved with IVF's; but patient remained orthostatic; reason why florinef was initiated and also meclizine was prescribed. -hydralazine has been discontinue  -PT recommending HHPT, which would be arranged  -cortisol WNL  2. History of CAD: No signs of ACS at this time. Continue on tele. Continue statin, plavix.   3. HTN: soft and with positive orthostatic changes. Will discontinue hydralazine. If antihypertensive agent is needed will recommend low dose norvasc.   4. Hypothyroidism: continue synthroid. TSH WNL   5. GERD: continue pepcid   6. Polymyalgia: Continue folate, voltarin, holding methotrexate as he takes this once a week and has taken it today.   7. Depression: continue sertraline    Procedures:  See below for x-ray reports  Consultations:  PT/OT  Discharge Exam: Filed Vitals:   08/08/13 1052  BP: 94/53  Pulse:   Temp:   Resp:    General: Slightly dizzy when standing, but according to patient much better and ready to go home. Cardiovascular: regular rate, positive SEM, no rubs or gallops  Respiratory: good air movement, no wheezing or crackles Abdomen: soft, NT, ND, positive BS  Musculoskeletal: no Le edema, cyanosis or clubbing    Discharge Instructions  Discharge Orders   Future Appointments Provider Department Dept Phone   09/04/2013 2:30 PM Don Levins, MD [facility] [phone]   09/23/2013 3:00 PM Don Rotunda, MD [facility] Main Office [place]) [phone]   Future  Orders Complete By Expires   Diet - low sodium heart healthy  As directed    Discharge instructions  As directed    Comments:     Take medications as prescribed Please arrange follow up with PCP in 5 days Maintain yourself well hydrated and increase PO intake Remember to  take your time when changing positions (especially when standing from sitting position)   Increase activity slowly  As directed        Medication List    STOP taking these medications       hydrALAZINE 25 MG tablet  Commonly known as:  APRESOLINE      TAKE these medications       albuterol 108 (90 BASE) MCG/ACT inhaler  Commonly known as:  PROVENTIL HFA;VENTOLIN HFA  Inhale 2 puffs into the lungs every 6 (six) hours as needed for wheezing or shortness of breath.     cholecalciferol 1000 UNITS tablet  Commonly known as:  VITAMIN D  Take 1,000 Units by mouth at bedtime.     clopidogrel 75 MG tablet  Commonly known as:  PLAVIX  Take 1 tablet (75 mg total) by mouth daily.     diclofenac sodium 1 % Gel  Commonly known as:  VOLTAREN  Apply 2 g topically 4 (four) times daily.     DSS 100 MG Caps  Take 100 mg by mouth 2 (two) times daily.     famotidine 20 MG tablet  Commonly known as:  PEPCID  Take 1 tablet (20 mg total) by mouth 2 (two) times daily.     fludrocortisone 0.1 MG tablet  Commonly known as:  FLORINEF  Take 1 tablet (0.1 mg total) by mouth daily.     folic acid 1 MG tablet  Commonly known as:  FOLVITE  Take 1 mg by mouth daily.     hyoscyamine 0.125 MG tablet  Commonly known as:  LEVSIN, ANASPAZ  Take 0.125 mg by mouth 4 (four) times daily as needed. Gas pain.     levothyroxine 137 MCG tablet  Commonly known as:  SYNTHROID, LEVOTHROID  Take 1 tablet (137 mcg total) by mouth daily.     meclizine 25 MG tablet  Commonly known as:  ANTIVERT  Take 1 tablet (25 mg total) by mouth 3 (three) times daily as needed for dizziness.     methotrexate 2.5 MG tablet  Commonly known as:  RHEUMATREX  Take 10 mg by mouth once a week. Takes 4 tablets weekly on Wednesday.   Caution:Chemotherapy. Protect from light.     pravastatin 40 MG tablet  Commonly known as:  PRAVACHOL  TAKE ONE TABLET BY MOUTH EVERY DAY     sertraline 100 MG tablet  Commonly known as:  ZOLOFT   Take 100 mg by mouth at bedtime.     traMADol 50 MG tablet  Commonly known as:  ULTRAM  Take 1 tablet (50 mg total) by mouth every 6 (six) hours as needed for pain.     traZODone 100 MG tablet  Commonly known as:  DESYREL  Take 0.5 tablets (50 mg total) by mouth at bedtime.     vitamin C 500 MG tablet  Commonly known as:  ASCORBIC ACID  Take 500 mg by mouth at bedtime.       Allergies  Allergen Reactions  . Bactrim [Sulfamethoxazole W-Trimethoprim] Other (See Comments)    hallucinations  . Horse-Derived Products Hives       Follow-up Information   Follow up  with Don Barre, MD. Schedule an appointment as soon as possible for a visit in 5 days.   Specialties:  Internal Medicine, Radiology   Contact information:   [address] [place] [address] [phone]        The results of significant diagnostics from this hospitalization (including imaging, microbiology, ancillary and laboratory) are listed below for reference.    Significant Diagnostic Studies: Dg Chest 2 View  08/06/2013   *RADIOLOGY REPORT*  Clinical Data: Fall.  CHEST - 2 VIEW  Comparison: 05/02/2013 and 12/05/2012  Findings: Examination demonstrates evidence of a small right pleural effusion which appears to be a chronic finding.  There is mild hazy density in the right mid to lower lung likely chronic. Left lung is clear.  Cardiomediastinal silhouette and remainder of the exam is unchanged.  IMPRESSION: Chronic changes in the right mid to lower lung with small chronic right effusion.   Original Report Authenticated By: Elberta Fortis, M.D.   Labs: Basic Metabolic Panel:  Recent Labs Lab 08/06/13 1640 08/07/13 0525 08/08/13 0435  NA 135 139 139  K 4.5 4.0 3.8  CL 102 101 103  CO2 28 29 27   GLUCOSE 107* 99 98  BUN 21 19 25*  CREATININE 1.32 1.29 1.32  CALCIUM 8.7 9.1 8.9   Liver Function Tests:  Recent Labs Lab 08/07/13 0525  AST 17  ALT 11  ALKPHOS 76  BILITOT 0.5  PROT 6.3   ALBUMIN 3.1*   CBC:  Recent Labs Lab 08/06/13 1640 08/07/13 0525 08/08/13 0435  WBC 12.7* 11.6* 8.2  NEUTROABS 9.7*  --   --   HGB 13.4 13.1 12.8*  HCT 40.6 40.7 38.2*  MCV 100.5* 100.7* 99.0  PLT 261 278 279   BNP: BNP (last 3 results)  Recent Labs  12/24/12 1131 12/24/12 1518 04/16/13 2327  PROBNP 2861.0* 2788.0* 1599.0*    Signed:  Jentri Aye  [facility] 08/08/2013, 11:45 AM

## 2013-08-11 ENCOUNTER — Other Ambulatory Visit: Payer: Self-pay | Admitting: Internal Medicine

## 2013-08-12 ENCOUNTER — Encounter: Payer: Self-pay | Admitting: Internal Medicine

## 2013-08-12 ENCOUNTER — Ambulatory Visit (INDEPENDENT_AMBULATORY_CARE_PROVIDER_SITE_OTHER): Payer: Medicare Other | Admitting: Internal Medicine

## 2013-08-12 VITALS — BP 120/84 | HR 70 | Temp 98.1°F | Wt 193.5 lb

## 2013-08-12 DIAGNOSIS — K5900 Constipation, unspecified: Secondary | ICD-10-CM

## 2013-08-12 DIAGNOSIS — E119 Type 2 diabetes mellitus without complications: Secondary | ICD-10-CM

## 2013-08-12 DIAGNOSIS — I951 Orthostatic hypotension: Secondary | ICD-10-CM | POA: Insufficient documentation

## 2013-08-12 NOTE — Assessment & Plan Note (Signed)
For miralax daily,  to f/u any worsening symptoms or concerns  

## 2013-08-12 NOTE — Patient Instructions (Addendum)
OK to try Miralax for constipation even taking every day, as this can help reduce nausea as well Please continue all other medications as before, and refills have been done if requested. Please have the pharmacy call with any other refills you may need.  Please keep your appointments with your specialists as you have planned   Please return in 4 months, or sooner if needed  OK to cancel the sept 2014 appt with me

## 2013-08-12 NOTE — Assessment & Plan Note (Signed)
Symptomatically improved it seems on current meds, adamant declines labs today, will f/u

## 2013-08-12 NOTE — Assessment & Plan Note (Signed)
stable overall by history and exam, recent data reviewed with pt, and pt to continue medical treatment as before,  to f/u any worsening symptoms or concerns Lab Results  Component Value Date   HGBA1C 5.9* 12/24/2012

## 2013-08-12 NOTE — Progress Notes (Signed)
Subjective:    Patient ID: [name], male    DOB: October 06, 1923, 79 y.o.   MRN: [id number]  HPI Here to f/u after recent episode 8/13-8/15 hospn/obs with near syncope, helped by family today.  States has done well post dc  - Pt denies chest pain, increased sob or doe, wheezing, orthopnea, PND, increased LE swelling, palpitations.  No syncope. No falls or injury.   Pt denies fever, wt loss, night sweats, loss of appetite, or other constitutional symptoms  Has had 3-4 days worsening constipation despite bid stool softner.    C/o general weakness and unsteadiness to walk, adamant he does not want PT, has done 3 courses prior per pt Past Medical History  Diagnosis Date  . CAD (coronary artery disease)     s/p NSTEMI and BMS stent diagonal07/09;  Lexiscan Myoview 9/13:  EF 62%, no ischemia  . AS (aortic stenosis)     a.  Echo 2009 mean gradient 9mm HG. AVA 2.18;  b.  Echo 4/11 mild AS mean gradient 10mm HG;  c.  Echo 04/2011: Mild LVH, EF 55-60%, grade 1 diastolic dysfunction, mild aortic stenosis, mean gradient 14, mild LAE;  d. Echo 9/13: mod LVH, EF 50-55%, mild to mod AS, mean gradient 17 mmHg  . AF (paroxysmal atrial fibrillation)     Holter monitor 2.5 sec pauses  . Fatigue     CPX 11/09 VO2  14.4 (75% predicte)d, slope 35.  O2 pulse normal.  VO2 corrected for body weight 17.6.  Marland Kitchen Hypothyroidism   . Hyperlipidemia   . History of prostate cancer   . Allergic rhinitis   . DM (diabetes mellitus)     type II diet controlled  . Shingles   . Left carotid stenosis   . Vitamin B 12 deficiency   . Anemia   . Osteoarthritis   . Nephrolithiasis   . Obesity   . OSA (obstructive sleep apnea)     AHI 15 PSG 09/06/08  . Hx of colonoscopy   . Stroke   . Anxiety and depression   . PMR (polymyalgia rheumatica) 05/29/2012  . Hypogonadism male 05/29/2012  . Hyperparathyroidism 05/29/2012  . TIA (transient ischemic attack) 05/29/2012  . CKD (chronic kidney disease) stage 4, GFR 15-29 ml/min 05/29/2012  .  COPD (chronic obstructive pulmonary disease) 05/29/2012  . Myocardial infarction   . Cancer     prostate ca history   Past Surgical History  Procedure Laterality Date  . Appendectomy    . Hernia repair    . Right carpal tunnel release    . Lumbar spine surgery    . Cystectomy      neck  . Prostatectomy    . Vasectomy    . Total knee arthroplasty      right  . Cholecystectomy      reports that he quit smoking about 54 years ago. His smoking use included Cigarettes. He has a 12.5 pack-year smoking history. He quit smokeless tobacco use about 43 years ago. His smokeless tobacco use included Snuff and Chew. He reports that he does not drink alcohol or use illicit drugs. family history includes Alcohol abuse (age of onset: 87) in his father; Coronary artery disease in an other family member; Stroke (age of onset: 61) in his mother. Allergies  Allergen Reactions  . Bactrim [Sulfamethoxazole W-Trimethoprim] Other (See Comments)    hallucinations  . Horse-Derived Products Hives   Current Outpatient Prescriptions on File Prior to Visit  Medication Sig Dispense Refill  .  albuterol (PROVENTIL HFA;VENTOLIN HFA) 108 (90 BASE) MCG/ACT inhaler Inhale 2 puffs into the lungs every 6 (six) hours as needed for wheezing or shortness of breath.  1 Inhaler  5  . Ascorbic Acid (VITAMIN C) 500 MG tablet Take 500 mg by mouth at bedtime.       . cholecalciferol (VITAMIN D) 1000 UNITS tablet Take 1,000 Units by mouth at bedtime.       . clopidogrel (PLAVIX) 75 MG tablet TAKE ONE TABLET BY MOUTH EVERY DAY  90 tablet  3  . diclofenac sodium (VOLTAREN) 1 % GEL Apply 2 g topically 4 (four) times daily.  3 Tube  1  . docusate sodium 100 MG CAPS Take 100 mg by mouth 2 (two) times daily.  10 capsule    . famotidine (PEPCID) 20 MG tablet Take 1 tablet (20 mg total) by mouth 2 (two) times daily.  180 tablet  3  . fludrocortisone (FLORINEF) 0.1 MG tablet Take 1 tablet (0.1 mg total) by mouth daily.  30 tablet  0  .  folic acid (FOLVITE) 1 MG tablet Take 1 mg by mouth daily.      . hyoscyamine (LEVSIN, ANASPAZ) 0.125 MG tablet Take 0.125 mg by mouth 4 (four) times daily as needed. Gas pain.      Marland Kitchen levothyroxine (SYNTHROID, LEVOTHROID) 137 MCG tablet Take 1 tablet (137 mcg total) by mouth daily.  90 tablet  3  . meclizine (ANTIVERT) 25 MG tablet Take 1 tablet (25 mg total) by mouth 3 (three) times daily as needed for dizziness.  30 tablet  0  . methotrexate (RHEUMATREX) 2.5 MG tablet Take 10 mg by mouth once a week. Takes 4 tablets weekly on Wednesday.   Caution:Chemotherapy. Protect from light.      . pravastatin (PRAVACHOL) 40 MG tablet TAKE ONE TABLET BY MOUTH EVERY DAY  30 tablet  5  . sertraline (ZOLOFT) 100 MG tablet Take 100 mg by mouth at bedtime.      . traMADol (ULTRAM) 50 MG tablet Take 1 tablet (50 mg total) by mouth every 6 (six) hours as needed for pain.  120 tablet  2  . traZODone (DESYREL) 100 MG tablet Take 0.5 tablets (50 mg total) by mouth at bedtime.  90 tablet  1  . [DISCONTINUED] solifenacin (VESICARE) 5 MG tablet Take 5 mg by mouth daily.         No current facility-administered medications on file prior to visit.   Review of Systems  Constitutional: Negative for unexpected weight change, or unusual diaphoresis  HENT: Negative for tinnitus.   Eyes: Negative for photophobia and visual disturbance.  Respiratory: Negative for choking and stridor.   Gastrointestinal: Negative for vomiting and blood in stool.  Genitourinary: Negative for hematuria and decreased urine volume.  Musculoskeletal: Negative for acute joint swelling Skin: Negative for color change and wound.  Neurological: Negative for tremors and numbness other than noted  Psychiatric/Behavioral: Negative for decreased concentration or  hyperactivity.       Objective:   Physical Exam BP 120/84  Pulse 70  Temp(Src) 98.1 F (36.7 C) (Oral)  Wt 193 lb 8 oz (87.771 kg)  BMI 29.43 kg/m2  SpO2 92% VS noted, not ill  appearing Constitutional: Pt appears well-developed and well-nourished.  HENT: Head: NCAT.  Right Ear: External ear normal.  Left Ear: External ear normal.  Eyes: Conjunctivae and EOM are normal. Pupils are equal, round, and reactive to light.  Neck: Normal range of motion. Neck supple.  Cardiovascular: Normal rate and regular rhythm.  (not irreg irreg) Pulmonary/Chest: Effort normal and breath sounds decreased bilat, no wheeze.  Abd:  Soft, NT, non-distended, + BS Neurological: Pt is alert. Not confused  Skin: Skin is warm. No erythema.  Psychiatric: Pt behavior is normal. Thought content normal. some irritable today    Assessment & Plan:

## 2013-08-15 ENCOUNTER — Other Ambulatory Visit: Payer: Self-pay | Admitting: Internal Medicine

## 2013-09-04 ENCOUNTER — Telehealth: Payer: Self-pay | Admitting: Internal Medicine

## 2013-09-04 ENCOUNTER — Ambulatory Visit: Payer: Medicare Other | Admitting: Internal Medicine

## 2013-09-04 MED ORDER — FLUDROCORTISONE ACETATE 0.1 MG PO TABS: 0.1000 mg | ORAL_TABLET | Freq: Every day | ORAL | Status: DC

## 2013-09-04 NOTE — Telephone Encounter (Signed)
Done erx 

## 2013-09-04 NOTE — Telephone Encounter (Signed)
Pt request refill for Fludrocortisone .1 mg to be send to [facility]. Pt stated that he got it when he was in the hospital. Please advise.

## 2013-09-15 ENCOUNTER — Telehealth: Payer: Self-pay | Admitting: *Deleted

## 2013-09-15 NOTE — Telephone Encounter (Signed)
Kim, OT, from [facility] called states she will being performing her OT evaluation on Tues 9.23.14 due to pt having MD appoint on Monday 9.22.14.

## 2013-09-17 ENCOUNTER — Telehealth: Payer: Self-pay | Admitting: *Deleted

## 2013-09-17 NOTE — Telephone Encounter (Signed)
Kim from [facility] called states pt complains of lightheadedness.  BP sitting 130/80, standing 110/80.  Requests order for nursing assessment.  Please advise

## 2013-09-17 NOTE — Telephone Encounter (Signed)
Left detailed message on VM.

## 2013-09-17 NOTE — Telephone Encounter (Signed)
Kim from [facility] called requesting OT twice a week for 3 weeks to evaluate leisure, safety, and upper body stregnthening and mobility.  Please advise

## 2013-09-17 NOTE — Telephone Encounter (Signed)
Ok for verbal if this is acceptable 

## 2013-09-17 NOTE — Telephone Encounter (Signed)
Ok for verbal 

## 2013-09-23 ENCOUNTER — Ambulatory Visit: Payer: Medicare Other | Admitting: Internal Medicine

## 2013-09-23 ENCOUNTER — Ambulatory Visit (INDEPENDENT_AMBULATORY_CARE_PROVIDER_SITE_OTHER): Payer: Medicare Other | Admitting: Internal Medicine

## 2013-09-23 ENCOUNTER — Encounter: Payer: Self-pay | Admitting: Internal Medicine

## 2013-09-23 ENCOUNTER — Telehealth: Payer: Self-pay | Admitting: *Deleted

## 2013-09-23 ENCOUNTER — Ambulatory Visit (INDEPENDENT_AMBULATORY_CARE_PROVIDER_SITE_OTHER): Payer: Medicare Other | Admitting: Cardiology

## 2013-09-23 ENCOUNTER — Encounter: Payer: Self-pay | Admitting: Cardiology

## 2013-09-23 VITALS — BP 102/70 | HR 91 | Temp 99.1°F | Wt 190.2 lb

## 2013-09-23 VITALS — BP 110/80 | HR 96 | Ht 68.0 in | Wt 190.0 lb

## 2013-09-23 DIAGNOSIS — Z23 Encounter for immunization: Secondary | ICD-10-CM

## 2013-09-23 DIAGNOSIS — G4700 Insomnia, unspecified: Secondary | ICD-10-CM | POA: Insufficient documentation

## 2013-09-23 DIAGNOSIS — E119 Type 2 diabetes mellitus without complications: Secondary | ICD-10-CM

## 2013-09-23 DIAGNOSIS — I951 Orthostatic hypotension: Secondary | ICD-10-CM

## 2013-09-23 DIAGNOSIS — I4891 Unspecified atrial fibrillation: Secondary | ICD-10-CM

## 2013-09-23 MED ORDER — MIDODRINE HCL 2.5 MG PO TABS: ORAL_TABLET | ORAL | Status: DC

## 2013-09-23 MED ORDER — TEMAZEPAM 15 MG PO CAPS: 15.0000 mg | ORAL_CAPSULE | Freq: Every evening | ORAL | Status: DC | PRN

## 2013-09-23 NOTE — Assessment & Plan Note (Addendum)
Confirmed on orthostatic today, ? Related to age vs other, agree with change to midodrine, to re-start his compression stockings he already has at home, rolling walker with seat in case has to sit quickly with walking, and to not go to BR to empty urinal- better to get 2 urinals for nighttime use if needed; wife states pt to follow No salt diet as per cardiology; also to stop the trazodone if might be related to daytime dizziness

## 2013-09-23 NOTE — Patient Instructions (Addendum)
You are given the prescription for the new walker today Please continue all other medications as before, including the new one per cardiology  Please have the pharmacy call with any other refills you may need.  OK to stop the trazodone at night, as this can contribute to dizziness the next day  Good luck with your prostate procedure

## 2013-09-23 NOTE — Telephone Encounter (Signed)
Kim called states pt reports lightheadedness, blurred vision and headaches.  Please advise

## 2013-09-23 NOTE — Telephone Encounter (Signed)
Ok to HOLD taking the trazodone nightly to see if this helps.

## 2013-09-23 NOTE — Telephone Encounter (Signed)
Left detailed message on Kims VM.

## 2013-09-23 NOTE — Progress Notes (Signed)
HPI  The patient presents for evaluation of CAD, PAF, syncope with orthostasis. Since I last saw him he was hospitalized with weakness. He did have orthostasis. He was taken not to hydralazine and was started on fludrocortisone. However, he's continued to have dropping blood pressures and weakness. He feels like he is being overmedicated. His caregiver says that he walks with a walker but doesn't use it and sometimes gets up and falls. He has an extensive cardiac history as described. Recently he's had no acute symptoms. I did review his last hospitalization from April when he was admitted with pneumonia and a falls. He's not describing any new shortness of breath, PND or orthopnea. He's not describing any chest pressure, neck or arm discomfort. He's not having any palpitations , presyncope or syncope.    Allergies  Allergen Reactions  . Bactrim [Sulfamethoxazole-Trimethoprim] Other (See Comments)    hallucinations  . Horse-Derived Products Hives    Current Outpatient Prescriptions  Medication Sig Dispense Refill  . albuterol (PROVENTIL HFA;VENTOLIN HFA) 108 (90 BASE) MCG/ACT inhaler Inhale 2 puffs into the lungs every 6 (six) hours as needed for wheezing or shortness of breath.  1 Inhaler  5  . Ascorbic Acid (VITAMIN C) 500 MG tablet Take 500 mg by mouth at bedtime.       . cholecalciferol (VITAMIN D) 1000 UNITS tablet Take 1,000 Units by mouth at bedtime.       . clopidogrel (PLAVIX) 75 MG tablet TAKE ONE TABLET BY MOUTH EVERY DAY  90 tablet  3  . diclofenac sodium (VOLTAREN) 1 % GEL Apply 2 g topically 4 (four) times daily.  3 Tube  1  . docusate sodium 100 MG CAPS Take 100 mg by mouth 2 (two) times daily.  10 capsule    . famotidine (PEPCID) 20 MG tablet Take 1 tablet (20 mg total) by mouth 2 (two) times daily.  180 tablet  3  . fludrocortisone (FLORINEF) 0.1 MG tablet Take 1 tablet (0.1 mg total) by mouth daily.  30 tablet  5  . folic acid (FOLVITE) 1 MG tablet Take 1 mg by mouth daily.       . hyoscyamine (LEVSIN, ANASPAZ) 0.125 MG tablet Take 0.125 mg by mouth 4 (four) times daily as needed. Gas pain.      Marland Kitchen levothyroxine (SYNTHROID, LEVOTHROID) 137 MCG tablet Take 1 tablet (137 mcg total) by mouth daily.  90 tablet  3  . meclizine (ANTIVERT) 25 MG tablet Take 1 tablet (25 mg total) by mouth 3 (three) times daily as needed for dizziness.  30 tablet  0  . methotrexate (RHEUMATREX) 2.5 MG tablet Take 10 mg by mouth once a week. Takes 4 tablets weekly on Wednesday.   Caution:Chemotherapy. Protect from light.      . pravastatin (PRAVACHOL) 40 MG tablet TAKE ONE TABLET BY MOUTH EVERY DAY  30 tablet  11  . sertraline (ZOLOFT) 100 MG tablet Take 100 mg by mouth at bedtime.      . solifenacin (VESICARE) 10 MG tablet Take 10 mg by mouth daily.      . traMADol (ULTRAM) 50 MG tablet Take 1 tablet (50 mg total) by mouth every 6 (six) hours as needed for pain.  120 tablet  2  . traZODone (DESYREL) 100 MG tablet Take 0.5 tablets (50 mg total) by mouth at bedtime.  90 tablet  1  . [DISCONTINUED] solifenacin (VESICARE) 5 MG tablet Take 5 mg by mouth daily.  No current facility-administered medications for this visit.    Past Medical History  Diagnosis Date  . CAD (coronary artery disease)     s/p NSTEMI and BMS stent diagonal07/09;  Lexiscan Myoview 9/13:  EF 62%, no ischemia  . AS (aortic stenosis)     a.  Echo 2009 mean gradient 9mm HG. AVA 2.18;  b.  Echo 4/11 mild AS mean gradient 10mm HG;  c.  Echo 04/2011: Mild LVH, EF 55-60%, grade 1 diastolic dysfunction, mild aortic stenosis, mean gradient 14, mild LAE;  d. Echo 9/13: mod LVH, EF 50-55%, mild to mod AS, mean gradient 17 mmHg  . AF (paroxysmal atrial fibrillation)     Holter monitor 2.5 sec pauses  . Fatigue     CPX 11/09 VO2  14.4 (75% predicte)d, slope 35.  O2 pulse normal.  VO2 corrected for body weight 17.6.  Marland Kitchen Hypothyroidism   . Hyperlipidemia   . History of prostate cancer   . Allergic rhinitis   . DM (diabetes  mellitus)     type II diet controlled  . Shingles   . Left carotid stenosis   . Vitamin B 12 deficiency   . Anemia   . Osteoarthritis   . Nephrolithiasis   . Obesity   . OSA (obstructive sleep apnea)     AHI 15 PSG 09/06/08  . Hx of colonoscopy   . Stroke   . Anxiety and depression   . PMR (polymyalgia rheumatica) 05/29/2012  . Hypogonadism male 05/29/2012  . Hyperparathyroidism 05/29/2012  . TIA (transient ischemic attack) 05/29/2012  . CKD (chronic kidney disease) stage 4, GFR 15-29 ml/min 05/29/2012  . COPD (chronic obstructive pulmonary disease) 05/29/2012  . Myocardial infarction   . Cancer     prostate ca history    Past Surgical History  Procedure Laterality Date  . Appendectomy    . Hernia repair    . Right carpal tunnel release    . Lumbar spine surgery    . Cystectomy      neck  . Prostatectomy    . Vasectomy    . Total knee arthroplasty      right  . Cholecystectomy      ROS:  As stated in the HPI and negative for all other systems.  PHYSICAL EXAM BP 110/80  Pulse 96  Ht 5\' 8"  (1.727 m)  Wt 190 lb (86.183 kg)  BMI 28.9 kg/m2 GEN:  No distress, frail appearing NECK:  No jugular venous distention at 90 degrees, waveform within normal limits, carotid upstroke brisk and symmetric, no bruits, no thyromegaly LYMPHATICS:  No cervical adenopathy LUNGS:  Clear to auscultation bilaterally BACK:  No CVA tenderness CHEST:  Unremarkable HEART:  S1 and S2 within normal limits, no S3, no S4, no clicks, no rubs, no murmurs, distant heart sounds ABD:  Positive bowel sounds normal in frequency in pitch, no bruits, no rebound, no guarding, unable to assess midline mass or bruit with the patient seated. EXT:  2 plus pulses throughout, moderate edema, no cyanosis no clubbing SKIN:  No rashes no nodules NEURO:  Cranial nerves II through XII grossly intact, motor grossly intact throughout PSYCH:  Cognitively intact, oriented to person place and time  EKG:  Sinus rhythm, rate 96,  axis within normal limits, intervals within normal limits, no acute ST-T wave changes.  09/23/2013  ASSESSMENT AND PLAN  Orthostasis:  This is the biggest problem.  I'm going to stop the fludrocortisone and try midodrine to see if this  helps his symptoms.  Chronic Diastolic CHF: He seems to be euvolemic. I will make changes as above.   Aortic Stenosis: Mild by last echo. I will follow this clinically and do not plan repeat imaging at this time.  CAD:  The patient has no new sypmtoms.  No further cardiovascular testing is indicated.  We will continue with aggressive risk reduction and meds as listed.  Atrial Fibrillation: Maintaining NSR. He is not a warfarin candidate with his frailty. He had no arrhythmia on the recent device check.

## 2013-09-23 NOTE — Assessment & Plan Note (Signed)
For temazepam 15 qhs prn,  to f/u any worsening symptoms or concerns

## 2013-09-23 NOTE — Patient Instructions (Addendum)
Stop Florinef and start Midodrine 2.5 mg one tablet at 7 am, 12N and 2 pm. Continue all other medications as listed.  Follow up in 2 months with Dr Antoine Poche.

## 2013-09-23 NOTE — Progress Notes (Signed)
Subjective:    Patient ID: [name], male    DOB: Nov 03, 1923, 79 y.o.   MRN: [id number]  HPI  Here to f/u with ongoing c/o weakness and dizziness, with ongoing orthostatic hypotension confirmed again today; has not had signficant improvement with florinef with good compliance, conts to fall, worse at night though maybe safer now he has urinal and BSC.  Still walking to BR to empty urinal at night however.  Florinef changed to midodrine per cardiology earlier today.  No other acute complaints, except will need replacement med if asked to stop the trazodone. Due for flu shot, prevnar.  Has a prostate procedure soon. Past Medical History  Diagnosis Date  . CAD (coronary artery disease)     s/p NSTEMI and BMS stent diagonal07/09;  Lexiscan Myoview 9/13:  EF 62%, no ischemia  . AS (aortic stenosis)     a.  Echo 2009 mean gradient 9mm HG. AVA 2.18;  b.  Echo 4/11 mild AS mean gradient 10mm HG;  c.  Echo 04/2011: Mild LVH, EF 55-60%, grade 1 diastolic dysfunction, mild aortic stenosis, mean gradient 14, mild LAE;  d. Echo 9/13: mod LVH, EF 50-55%, mild to mod AS, mean gradient 17 mmHg  . AF (paroxysmal atrial fibrillation)     Holter monitor 2.5 sec pauses  . Fatigue     CPX 11/09 VO2  14.4 (75% predicte)d, slope 35.  O2 pulse normal.  VO2 corrected for body weight 17.6.  Marland Kitchen Hypothyroidism   . Hyperlipidemia   . History of prostate cancer   . Allergic rhinitis   . DM (diabetes mellitus)     type II diet controlled  . Shingles   . Left carotid stenosis   . Vitamin B 12 deficiency   . Anemia   . Osteoarthritis   . Nephrolithiasis   . Obesity   . OSA (obstructive sleep apnea)     AHI 15 PSG 09/06/08  . Hx of colonoscopy   . Stroke   . Anxiety and depression   . PMR (polymyalgia rheumatica) 05/29/2012  . Hypogonadism male 05/29/2012  . Hyperparathyroidism 05/29/2012  . TIA (transient ischemic attack) 05/29/2012  . CKD (chronic kidney disease) stage 4, GFR 15-29 ml/min 05/29/2012  . COPD  (chronic obstructive pulmonary disease) 05/29/2012  . Myocardial infarction   . Cancer     prostate ca history   Past Surgical History  Procedure Laterality Date  . Appendectomy    . Hernia repair    . Right carpal tunnel release    . Lumbar spine surgery    . Cystectomy      neck  . Prostatectomy    . Vasectomy    . Total knee arthroplasty      right  . Cholecystectomy      reports that he quit smoking about 54 years ago. His smoking use included Cigarettes. He has a 12.5 pack-year smoking history. He quit smokeless tobacco use about 43 years ago. His smokeless tobacco use included Snuff and Chew. He reports that he does not drink alcohol or use illicit drugs. family history includes Alcohol abuse (age of onset: 7) in his father; Coronary artery disease in an other family member; Stroke (age of onset: 18) in his mother. Allergies  Allergen Reactions  . Bactrim [Sulfamethoxazole-Trimethoprim] Other (See Comments)    hallucinations  . Horse-Derived Products Hives   Current Outpatient Prescriptions on File Prior to Visit  Medication Sig Dispense Refill  . albuterol (PROVENTIL HFA;VENTOLIN HFA) 108 (90  BASE) MCG/ACT inhaler Inhale 2 puffs into the lungs every 6 (six) hours as needed for wheezing or shortness of breath.  1 Inhaler  5  . Ascorbic Acid (VITAMIN C) 500 MG tablet Take 500 mg by mouth at bedtime.       . cholecalciferol (VITAMIN D) 1000 UNITS tablet Take 1,000 Units by mouth at bedtime.       . clopidogrel (PLAVIX) 75 MG tablet TAKE ONE TABLET BY MOUTH EVERY DAY  90 tablet  3  . diclofenac sodium (VOLTAREN) 1 % GEL Apply 2 g topically 4 (four) times daily.  3 Tube  1  . docusate sodium 100 MG CAPS Take 100 mg by mouth 2 (two) times daily.  10 capsule    . famotidine (PEPCID) 20 MG tablet Take 1 tablet (20 mg total) by mouth 2 (two) times daily.  180 tablet  3  . folic acid (FOLVITE) 1 MG tablet Take 1 mg by mouth daily.      . hyoscyamine (LEVSIN, ANASPAZ) 0.125 MG tablet  Take 0.125 mg by mouth 4 (four) times daily as needed. Gas pain.      Marland Kitchen levothyroxine (SYNTHROID, LEVOTHROID) 137 MCG tablet Take 1 tablet (137 mcg total) by mouth daily.  90 tablet  3  . meclizine (ANTIVERT) 25 MG tablet Take 1 tablet (25 mg total) by mouth 3 (three) times daily as needed for dizziness.  30 tablet  0  . methotrexate (RHEUMATREX) 2.5 MG tablet Take 10 mg by mouth once a week. Takes 6 tablets weekly on Wednesday.   Caution:Chemotherapy. Protect from light.      . midodrine (PROAMATINE) 2.5 MG tablet Take one tablet at 7 am, 12N and 2 pm  90 tablet  6  . pravastatin (PRAVACHOL) 40 MG tablet TAKE ONE TABLET BY MOUTH EVERY DAY  30 tablet  11  . sertraline (ZOLOFT) 100 MG tablet Take 100 mg by mouth at bedtime.      . solifenacin (VESICARE) 10 MG tablet Take 10 mg by mouth daily.      . traMADol (ULTRAM) 50 MG tablet Take 1 tablet (50 mg total) by mouth every 6 (six) hours as needed for pain.  120 tablet  2  . [DISCONTINUED] solifenacin (VESICARE) 5 MG tablet Take 5 mg by mouth daily.         No current facility-administered medications on file prior to visit.   Review of Systems  Constitutional: Negative for unexpected weight change, or unusual diaphoresis  HENT: Negative for tinnitus.   Eyes: Negative for photophobia and visual disturbance.  Respiratory: Negative for choking and stridor.   Gastrointestinal: Negative for vomiting and blood in stool.  Genitourinary: Negative for hematuria and decreased urine volume.  Musculoskeletal: Negative for acute joint swelling Skin: Negative for color change and wound.  Neurological: Negative for tremors and numbness other than noted  Psychiatric/Behavioral: Negative for decreased concentration or  hyperactivity.       Objective:   Physical Exam BP 102/70  Pulse 91  Temp(Src) 99.1 F (37.3 C) (Oral)  Wt 190 lb 4 oz (86.297 kg)  BMI 28.93 kg/m2  SpO2 92% VS noted,  Constitutional: Pt appears well-developed and  well-nourished./obese  HENT: Head: NCAT.  Right Ear: External ear normal.  Left Ear: External ear normal.  Eyes: Conjunctivae and EOM are normal. Pupils are equal, round, and reactive to light.  Neck: Normal range of motion. Neck supple.  Cardiovascular: Normal rate and regular rhythm.   Pulmonary/Chest: Effort normal and  breath sounds normal.  Abd:  Soft, NT, non-distended, + BS Neurological: Pt is alert. Motor no chagne Skin: Skin is warm. No erythema.  Psychiatric: Pt behavior is normal.      Assessment & Plan:

## 2013-09-23 NOTE — Assessment & Plan Note (Signed)
stable overall by history and exam, recent data reviewed with pt, and pt to continue medical treatment as before,  to f/u any worsening symptoms or concerns Lab Results  Component Value Date   HGBA1C 5.9* 12/24/2012    

## 2013-09-25 ENCOUNTER — Telehealth: Payer: Self-pay

## 2013-09-25 NOTE — Telephone Encounter (Signed)
OK to cont the midodrine for now and cont to monitor BP's and symtpoms

## 2013-09-25 NOTE — Telephone Encounter (Signed)
[facility] OT Annice Needy [phone]) called to report the patient took the first dose of Midodrine this morning.  HHRN to report the patients BP in left arm 138/98 and right arm 150/98 (unusual for this patient).  The patient does state he has a headache, but HHRN states this is normal for the patient to have a  Headache in the mornings. Please advise

## 2013-09-25 NOTE — Telephone Encounter (Signed)
Called [facility]  Left detailed message of MD instructions.

## 2013-09-26 ENCOUNTER — Telehealth: Payer: Self-pay | Admitting: Internal Medicine

## 2013-09-26 NOTE — Telephone Encounter (Signed)
Barbara(care giver/cousin) called request to change the time that she give [name]'s Midodrine 2.5 mg from (7 am, 12pm and 2pm) to (9am, 2pm and 4 pm). [name] stated that [name] complain of getting up too early. Please call [name] if this is ok.

## 2013-09-26 NOTE — Telephone Encounter (Signed)
Patients caregiver informed

## 2013-09-26 NOTE — Telephone Encounter (Signed)
Ok for this? 

## 2013-09-30 ENCOUNTER — Telehealth: Payer: Self-pay | Admitting: *Deleted

## 2013-09-30 NOTE — Telephone Encounter (Signed)
Called PT left detailed message of MD verbal OK.

## 2013-09-30 NOTE — Telephone Encounter (Signed)
Maurine Minister called requesting PT twice a week for 4 weeks.  Please advise

## 2013-09-30 NOTE — Telephone Encounter (Signed)
For verbal ok

## 2013-10-14 ENCOUNTER — Telehealth: Payer: Self-pay | Admitting: *Deleted

## 2013-10-14 NOTE — Telephone Encounter (Signed)
Ok for verbal 

## 2013-10-14 NOTE — Telephone Encounter (Signed)
Called HHRN left a detailed message.

## 2013-10-14 NOTE — Telephone Encounter (Signed)
[name] called states pt fell yesterday.  Also requests continued physical therapy, skilled nursing, and occupational therapy services.  Please advise

## 2013-10-14 NOTE — Telephone Encounter (Signed)
Dennis called states pt fell yesterday.  Also requests continued physical therapy, skilled nursing, and occupational therapy services.  Please advise 

## 2013-10-15 ENCOUNTER — Telehealth: Payer: Self-pay | Admitting: *Deleted

## 2013-10-15 MED ORDER — ONETOUCH LANCETS MISC: Status: DC

## 2013-10-15 MED ORDER — ONETOUCH BASIC SYSTEM W/DEVICE KIT: PACK | Status: DC

## 2013-10-15 MED ORDER — GLUCOSE BLOOD VI STRP: ORAL_STRIP | Status: DC

## 2013-10-15 NOTE — Telephone Encounter (Signed)
This is noted  Unfortunately there are no medications to adjust at this time to help with this

## 2013-10-15 NOTE — Telephone Encounter (Signed)
HHRN informed and sent in supplies as instructed.

## 2013-10-15 NOTE — Telephone Encounter (Signed)
HHRN did want PCP to know checking the BP this am was 110/60 and when he stood up it was 80/50.

## 2013-10-15 NOTE — Telephone Encounter (Signed)
Lynnae Sandhoff called requesting additional visits to pt twice a week for 3 weeks.  Also requesting a Glucometer with supplies.

## 2013-10-15 NOTE — Telephone Encounter (Signed)
Ok for verbal  Also ok for glucomet and supplies - to [place] for [employer]

## 2013-10-22 ENCOUNTER — Telehealth: Payer: Self-pay

## 2013-10-22 NOTE — Telephone Encounter (Signed)
HHRN informed 

## 2013-10-22 NOTE — Telephone Encounter (Signed)
[facility] Darl Pikes RN) called to inform the patients BP (systolic) is all over the place between 100 to 150 and still dropping when standing.  Patient has been very tired since starting on Midodrine and shakey.  They did check his BS and is ok.  Please advise

## 2013-10-22 NOTE — Telephone Encounter (Signed)
unfortuantely there is little else that can be done, ok to follow for now

## 2013-10-27 DIAGNOSIS — I10 Essential (primary) hypertension: Secondary | ICD-10-CM

## 2013-10-27 DIAGNOSIS — IMO0001 Reserved for inherently not codable concepts without codable children: Secondary | ICD-10-CM

## 2013-10-27 DIAGNOSIS — I2510 Atherosclerotic heart disease of native coronary artery without angina pectoris: Secondary | ICD-10-CM

## 2013-10-27 DIAGNOSIS — I4891 Unspecified atrial fibrillation: Secondary | ICD-10-CM

## 2013-10-31 ENCOUNTER — Telehealth: Payer: Self-pay

## 2013-10-31 NOTE — Telephone Encounter (Signed)
PT informed of MD instructions.

## 2013-10-31 NOTE — Telephone Encounter (Signed)
Phone call from Roxan Hockey, PT assistant [phone]. She states patient had a fall yesterday and he does have a scratch on his arm. He was getting up from where he was sitting and turned around and his legs just got tangled and he fell. Caregiver, [name], states increased confusion from patient like not knowing where his bedroom is. Patient's home number is [phone]. Please advise.

## 2013-10-31 NOTE — Telephone Encounter (Signed)
Pt has known ongoing orthostatic hypotension which can contribute to falls, to cont PT as able to tolerate, consider OV for further falls, or worsening fever, weakness or confusion

## 2013-11-03 ENCOUNTER — Telehealth: Payer: Self-pay | Admitting: *Deleted

## 2013-11-03 ENCOUNTER — Emergency Department (HOSPITAL_COMMUNITY)
Admission: EM | Admit: 2013-11-03 | Discharge: 2013-11-03 | Disposition: A | Discharge status: Home / Self Care | Payer: Medicare Other | Attending: Emergency Medicine | Admitting: Emergency Medicine

## 2013-11-03 ENCOUNTER — Telehealth: Payer: Self-pay | Admitting: Cardiology

## 2013-11-03 ENCOUNTER — Emergency Department (HOSPITAL_COMMUNITY): Payer: Medicare Other

## 2013-11-03 ENCOUNTER — Encounter (HOSPITAL_COMMUNITY): Payer: Self-pay | Admitting: Emergency Medicine

## 2013-11-03 DIAGNOSIS — E119 Type 2 diabetes mellitus without complications: Secondary | ICD-10-CM | POA: Insufficient documentation

## 2013-11-03 DIAGNOSIS — I252 Old myocardial infarction: Secondary | ICD-10-CM | POA: Insufficient documentation

## 2013-11-03 DIAGNOSIS — J449 Chronic obstructive pulmonary disease, unspecified: Secondary | ICD-10-CM | POA: Insufficient documentation

## 2013-11-03 DIAGNOSIS — Z8546 Personal history of malignant neoplasm of prostate: Secondary | ICD-10-CM | POA: Insufficient documentation

## 2013-11-03 DIAGNOSIS — F329 Major depressive disorder, single episode, unspecified: Secondary | ICD-10-CM | POA: Insufficient documentation

## 2013-11-03 DIAGNOSIS — Z87891 Personal history of nicotine dependence: Secondary | ICD-10-CM | POA: Insufficient documentation

## 2013-11-03 DIAGNOSIS — Z8673 Personal history of transient ischemic attack (TIA), and cerebral infarction without residual deficits: Secondary | ICD-10-CM | POA: Insufficient documentation

## 2013-11-03 DIAGNOSIS — D649 Anemia, unspecified: Secondary | ICD-10-CM | POA: Insufficient documentation

## 2013-11-03 DIAGNOSIS — E669 Obesity, unspecified: Secondary | ICD-10-CM | POA: Insufficient documentation

## 2013-11-03 DIAGNOSIS — R5381 Other malaise: Secondary | ICD-10-CM | POA: Insufficient documentation

## 2013-11-03 DIAGNOSIS — N184 Chronic kidney disease, stage 4 (severe): Secondary | ICD-10-CM | POA: Insufficient documentation

## 2013-11-03 DIAGNOSIS — Z8669 Personal history of other diseases of the nervous system and sense organs: Secondary | ICD-10-CM | POA: Insufficient documentation

## 2013-11-03 DIAGNOSIS — E785 Hyperlipidemia, unspecified: Secondary | ICD-10-CM | POA: Insufficient documentation

## 2013-11-03 DIAGNOSIS — E039 Hypothyroidism, unspecified: Secondary | ICD-10-CM | POA: Insufficient documentation

## 2013-11-03 DIAGNOSIS — Z8619 Personal history of other infectious and parasitic diseases: Secondary | ICD-10-CM | POA: Insufficient documentation

## 2013-11-03 DIAGNOSIS — Z87442 Personal history of urinary calculi: Secondary | ICD-10-CM | POA: Insufficient documentation

## 2013-11-03 DIAGNOSIS — J4489 Other specified chronic obstructive pulmonary disease: Secondary | ICD-10-CM | POA: Insufficient documentation

## 2013-11-03 DIAGNOSIS — R350 Frequency of micturition: Secondary | ICD-10-CM | POA: Insufficient documentation

## 2013-11-03 DIAGNOSIS — Z79899 Other long term (current) drug therapy: Secondary | ICD-10-CM | POA: Insufficient documentation

## 2013-11-03 DIAGNOSIS — F411 Generalized anxiety disorder: Secondary | ICD-10-CM | POA: Insufficient documentation

## 2013-11-03 DIAGNOSIS — Z8739 Personal history of other diseases of the musculoskeletal system and connective tissue: Secondary | ICD-10-CM | POA: Insufficient documentation

## 2013-11-03 DIAGNOSIS — F3289 Other specified depressive episodes: Secondary | ICD-10-CM | POA: Insufficient documentation

## 2013-11-03 DIAGNOSIS — R443 Hallucinations, unspecified: Secondary | ICD-10-CM

## 2013-11-03 DIAGNOSIS — Z7902 Long term (current) use of antithrombotics/antiplatelets: Secondary | ICD-10-CM | POA: Insufficient documentation

## 2013-11-03 DIAGNOSIS — I2510 Atherosclerotic heart disease of native coronary artery without angina pectoris: Secondary | ICD-10-CM | POA: Insufficient documentation

## 2013-11-03 LAB — CBC WITH DIFFERENTIAL/PLATELET
Eosinophils Absolute: 0.1 10*3/uL (ref 0.0–0.7)
Eosinophils Relative: 1 % (ref 0–5)
Hemoglobin: 13.5 g/dL (ref 13.0–17.0)
Lymphocytes Relative: 12 % (ref 12–46)
Lymphs Abs: 1.2 10*3/uL (ref 0.7–4.0)
MCH: 34.1 pg — ABNORMAL HIGH (ref 26.0–34.0)
MCHC: 33.1 g/dL (ref 30.0–36.0)
MCV: 103 fL — ABNORMAL HIGH (ref 78.0–100.0)
Monocytes Relative: 8 % (ref 3–12)
RBC: 3.96 MIL/uL — ABNORMAL LOW (ref 4.22–5.81)

## 2013-11-03 LAB — COMPREHENSIVE METABOLIC PANEL
Alkaline Phosphatase: 72 U/L (ref 39–117)
BUN: 25 mg/dL — ABNORMAL HIGH (ref 6–23)
CO2: 26 mEq/L (ref 19–32)
Calcium: 9.1 mg/dL (ref 8.4–10.5)
Chloride: 103 mEq/L (ref 96–112)
GFR calc Af Amer: 51 mL/min — ABNORMAL LOW (ref >90)
GFR calc non Af Amer: 44 mL/min — ABNORMAL LOW (ref >90)
Glucose, Bld: 119 mg/dL — ABNORMAL HIGH (ref 70–99)
Potassium: 4.5 mEq/L (ref 3.5–5.1)
Sodium: 139 mEq/L (ref 135–145)
Total Protein: 6.2 g/dL (ref 6.0–8.3)

## 2013-11-03 LAB — URINALYSIS, ROUTINE W REFLEX MICROSCOPIC
Bilirubin Urine: NEGATIVE
Ketones, ur: NEGATIVE mg/dL
Leukocytes, UA: NEGATIVE
Nitrite: NEGATIVE
Urobilinogen, UA: 1 mg/dL (ref 0.0–1.0)
pH: 5.5 (ref 5.0–8.0)

## 2013-11-03 NOTE — ED Notes (Addendum)
PER EMS- pt picked up from home with c/o urinary frequency x4 weeks and AMS x6 weeks.  Pt has hx of dementia.  Arrived to ED alert and oriented per baseline.  Reports pt is ambulatory with walker.

## 2013-11-03 NOTE — Telephone Encounter (Signed)
[name], [facility] nurse, called to report that patient is having hallucinations/confusion for past week and worsening over the past two days. He is increasingly weak and "shaky". Fell last week. HH nurse called PCP - he advised that patient should call cardiology and "cut back on the Midodrine from three times per day to once per day.  BP's averaging low and HH nurse/caregiver states the change from Florinef to Midodrine "has not really helped at all anyway". Reviewed Midodrine PI with pharmacist.  Patient has history of UTI and TIA.  [facility] nurse expresses concern that patient is getting "worse" with the confusion over past couple of days. No fever or chills. Voiding "alot at night time but no so much during day".  Advised that patient should go to ED given his increased confusion and weakness, in light of his hx of TIA's. [facility] nurse will have caregiver take patient on to ED at this time. Routed to Dr. Antoine Poche, as Lorain Childes.

## 2013-11-03 NOTE — Telephone Encounter (Signed)
A) Midodrin was rx'd by cardiology not Dr Jonny Ruiz -  B) this medication is intended to treat these symptoms (symptoms pre-existing the medication change)  However, try taking Midrin just once daily in the morning to see if this helps reduce "side effects" Also contact cardiology for further advice or office visit with Dr. Jonny Ruiz as needed

## 2013-11-03 NOTE — Telephone Encounter (Signed)
New problem   Darl Pikes ( nurse ) [facility] nurse is concerned.  Patient is taking Midodrine for 3-4wks, He is going down hill quickly. He is really weak, hallucinating, nauseated, headache. He was prescribed for 3 day to take at - am pm and pm. PCP Dr Jonny Ruiz advised Darl Pikes to only give him 1 a day.

## 2013-11-03 NOTE — ED Provider Notes (Signed)
CSN: [id number]     Arrival date & time 11/03/13  1557 History   First MD Initiated Contact with Patient 11/03/13 1609     Chief Complaint  Patient presents with  . Urinary Frequency  . Altered Mental Status   (Consider location/radiation/quality/duration/timing/severity/associated sxs/prior Treatment) Patient is a 79 y.o. male presenting with frequency and altered mental status. The history is provided by a relative (the pt has been hallucinating recently.  this happened before when he was taking prednisone).  Urinary Frequency This is a recurrent problem. The current episode started more than 2 days ago. The problem occurs rarely. The problem has not changed since onset.Pertinent negatives include no chest pain, no abdominal pain and no headaches. Nothing aggravates the symptoms. Nothing relieves the symptoms. He has tried nothing for the symptoms.  Altered Mental Status Associated symptoms: hallucinations   Associated symptoms: no abdominal pain, no headaches, no rash and no seizures     Past Medical History  Diagnosis Date  . CAD (coronary artery disease)     s/p NSTEMI and BMS stent diagonal07/09;  Lexiscan Myoview 9/13:  EF 62%, no ischemia  . AS (aortic stenosis)     a.  Echo 2009 mean gradient 9mm HG. AVA 2.18;  b.  Echo 4/11 mild AS mean gradient 10mm HG;  c.  Echo 04/2011: Mild LVH, EF 55-60%, grade 1 diastolic dysfunction, mild aortic stenosis, mean gradient 14, mild LAE;  d. Echo 9/13: mod LVH, EF 50-55%, mild to mod AS, mean gradient 17 mmHg  . AF (paroxysmal atrial fibrillation)     Holter monitor 2.5 sec pauses  . Fatigue     CPX 11/09 VO2  14.4 (75% predicte)d, slope 35.  O2 pulse normal.  VO2 corrected for body weight 17.6.  Marland Kitchen Hypothyroidism   . Hyperlipidemia   . History of prostate cancer   . Allergic rhinitis   . DM (diabetes mellitus)     type II diet controlled  . Shingles   . Left carotid stenosis   . Vitamin B 12 deficiency   . Anemia   . Osteoarthritis    . Nephrolithiasis   . Obesity   . OSA (obstructive sleep apnea)     AHI 15 PSG 09/06/08  . Hx of colonoscopy   . Stroke   . Anxiety and depression   . PMR (polymyalgia rheumatica) 05/29/2012  . Hypogonadism male 05/29/2012  . Hyperparathyroidism 05/29/2012  . TIA (transient ischemic attack) 05/29/2012  . CKD (chronic kidney disease) stage 4, GFR 15-29 ml/min 05/29/2012  . COPD (chronic obstructive pulmonary disease) 05/29/2012  . Myocardial infarction   . Cancer     prostate ca history   Past Surgical History  Procedure Laterality Date  . Appendectomy    . Hernia repair    . Right carpal tunnel release    . Lumbar spine surgery    . Cystectomy      neck  . Prostatectomy    . Vasectomy    . Total knee arthroplasty      right  . Cholecystectomy     Family History  Problem Relation Age of Onset  . Stroke Mother 49  . Coronary artery disease      father, brother, sister  . Alcohol abuse Father 35   History  Substance Use Topics  . Smoking status: Former Smoker -- 0.50 packs/day for 25 years    Types: Cigarettes    Quit date: 12/25/1958  . Smokeless tobacco: Former Neurosurgeon  Types: Snuff, Chew    Quit date: 08/06/1970     Comment: quit in 1950s  . Alcohol Use: No    Review of Systems  Constitutional: Negative for appetite change and fatigue.  HENT: Negative for congestion, ear discharge and sinus pressure.   Eyes: Negative for discharge.  Respiratory: Negative for cough.   Cardiovascular: Negative for chest pain.  Gastrointestinal: Negative for abdominal pain and diarrhea.  Genitourinary: Positive for frequency. Negative for hematuria.  Musculoskeletal: Negative for back pain.  Skin: Negative for rash.  Neurological: Negative for seizures and headaches.  Psychiatric/Behavioral: Positive for hallucinations.    Allergies  Bactrim and Horse-derived products  Home Medications   Current Outpatient Rx  Name  Route  Sig  Dispense  Refill  . albuterol (PROVENTIL  HFA;VENTOLIN HFA) 108 (90 BASE) MCG/ACT inhaler   Inhalation   Inhale 2 puffs into the lungs every 6 (six) hours as needed for wheezing or shortness of breath.   1 Inhaler   5   . Ascorbic Acid (VITAMIN C) 500 MG tablet   Oral   Take 500 mg by mouth at bedtime.          . cholecalciferol (VITAMIN D) 1000 UNITS tablet   Oral   Take 1,000 Units by mouth at bedtime.          . clopidogrel (PLAVIX) 75 MG tablet      TAKE ONE TABLET BY MOUTH EVERY DAY   90 tablet   3   . diclofenac sodium (VOLTAREN) 1 % GEL   Topical   Apply 2 g topically 4 (four) times daily.   3 Tube   1   . docusate sodium 100 MG CAPS   Oral   Take 100 mg by mouth 2 (two) times daily.   10 capsule      . famotidine (PEPCID) 20 MG tablet   Oral   Take 1 tablet (20 mg total) by mouth 2 (two) times daily.   180 tablet   3   . folic acid (FOLVITE) 1 MG tablet   Oral   Take 1 mg by mouth daily.         . hyoscyamine (LEVSIN, ANASPAZ) 0.125 MG tablet   Oral   Take 0.125 mg by mouth 4 (four) times daily as needed. Gas pain.         Marland Kitchen levothyroxine (SYNTHROID, LEVOTHROID) 137 MCG tablet   Oral   Take 1 tablet (137 mcg total) by mouth daily.   90 tablet   3   . meclizine (ANTIVERT) 25 MG tablet   Oral   Take 1 tablet (25 mg total) by mouth 3 (three) times daily as needed for dizziness.   30 tablet   0   . methotrexate (RHEUMATREX) 2.5 MG tablet   Oral   Take 15 mg by mouth once a week. Takes 6 tablets weekly on Wednesday.   Caution:Chemotherapy. Protect from light.         . methylPREDNISolone (MEDROL) 4 MG tablet   Oral   Take 10 mg by mouth daily.         . midodrine (PROAMATINE) 2.5 MG tablet      Take one tablet at 7 am, 12N and 2 pm   90 tablet   6   . pravastatin (PRAVACHOL) 40 MG tablet      TAKE ONE TABLET BY MOUTH EVERY DAY   30 tablet   11   . sertraline (ZOLOFT) 100 MG tablet  Oral   Take 100 mg by mouth at bedtime.         . solifenacin (VESICARE) 10 MG  tablet   Oral   Take 10 mg by mouth at bedtime.          . temazepam (RESTORIL) 15 MG capsule   Oral   Take 1 capsule (15 mg total) by mouth at bedtime as needed for sleep.   30 capsule   2   . traMADol (ULTRAM) 50 MG tablet   Oral   Take 1 tablet (50 mg total) by mouth every 6 (six) hours as needed for pain.   120 tablet   2    BP 113/59  Pulse 65  Temp(Src) 98 F (36.7 C) (Oral)  Resp 18  SpO2 96% Physical Exam  Constitutional: He is oriented to person, place, and time. He appears well-developed.  HENT:  Head: Normocephalic.  Eyes: Conjunctivae and EOM are normal. No scleral icterus.  Neck: Neck supple. No thyromegaly present.  Cardiovascular: Normal rate and regular rhythm.  Exam reveals no gallop and no friction rub.   No murmur heard. Pulmonary/Chest: No stridor. He has no wheezes. He has no rales. He exhibits no tenderness.  Abdominal: He exhibits no distension. There is no tenderness. There is no rebound.  Musculoskeletal: He exhibits no edema.  Weakness both lower extremities.  This is not new  Lymphadenopathy:    He has no cervical adenopathy.  Neurological: He is oriented to person, place, and time. He exhibits normal muscle tone. Coordination normal.  Skin: No rash noted. No erythema.  Psychiatric: He has a normal mood and affect. His behavior is normal.    ED Course  Procedures (including critical care time) Labs Review Labs Reviewed  CBC WITH DIFFERENTIAL - Abnormal; Notable for the following:    RBC 3.96 (*)    MCV 103.0 (*)    MCH 34.1 (*)    Neutrophils Relative % 80 (*)    Neutro Abs 8.0 (*)    All other components within normal limits  COMPREHENSIVE METABOLIC PANEL - Abnormal; Notable for the following:    Glucose, Bld 119 (*)    BUN 25 (*)    Creatinine, Ser 1.37 (*)    Albumin 3.2 (*)    GFR calc non Af Amer 44 (*)    GFR calc Af Amer 51 (*)    All other components within normal limits  URINALYSIS, ROUTINE W REFLEX MICROSCOPIC    Imaging Review Dg Chest 2 View  11/03/2013   CLINICAL DATA:  Altered mental status. Possible recent fall. Weakness.  EXAM: CHEST  2 VIEW  COMPARISON:  08/06/2013  FINDINGS: Stable density at the right lung base likely reflects scarring. Suspect small chronic right pleural effusion versus pleural thickening, stable. No confluent opacity on the left. Heart is borderline in size. No acute bony abnormality or pneumothorax.  IMPRESSION: Stable chronic changes. No acute process.   Electronically Signed   By: Charlett Nose M.D.   On: 11/03/2013 17:45   Dg Hip Complete Right  11/03/2013   CLINICAL DATA:  Fall, right hip pain.  EXAM: RIGHT HIP - COMPLETE 2+ VIEW  COMPARISON:  None.  FINDINGS: Mild degenerative changes in the hips bilaterally. No acute bony abnormality. Specifically, no fracture, subluxation, or dislocation. Soft tissues are intact.  IMPRESSION: No acute bony abnormality.   Electronically Signed   By: Charlett Nose M.D.   On: 11/03/2013 17:41   Ct Head Wo Contrast  11/03/2013  CLINICAL DATA:  79 year old male with altered mental status. No known injury. Initial encounter.  EXAM: CT HEAD WITHOUT CONTRAST  TECHNIQUE: Contiguous axial images were obtained from the base of the skull through the vertex without intravenous contrast.  COMPARISON:  12/24/2012 and earlier.  FINDINGS: Visualized paranasal sinuses and mastoids are clear. Stable visualized osseous structures. No acute orbit or scalp soft tissue findings identified.  Calcified atherosclerosis at the skull base. Stable cerebral volume. No ventriculomegaly. No midline shift, mass effect, or evidence of intracranial mass lesion. No acute intracranial hemorrhage identified. No evidence of cortically based acute infarction identified. No suspicious intracranial vascular hyperdensity.  IMPRESSION: No acute intracranial abnormality. Stable generalized cerebral volume loss.   Electronically Signed   By: Augusto Gamble M.D.   On: 11/03/2013 18:23   Dg  Knee Complete 4 Views Right  11/03/2013   CLINICAL DATA:  Fall, right knee pain.  EXAM: RIGHT KNEE - COMPLETE 4+ VIEW  COMPARISON:  04/24/2013  FINDINGS: Prior right knee replacement. No hardware or bony complicating feature. No joint effusion. Soft tissues are intact. Vascular calcifications noted.  IMPRESSION: Right knee replacement. No acute findings.   Electronically Signed   By: Charlett Nose M.D.   On: 11/03/2013 17:44    EKG Interpretation   None       MDM  Spoke with son and he will stop the steroids and see if the pt improves.  Follow up with pcp   1. Hallucinations       Benny Lennert, MD 11/03/13 [phone]

## 2013-11-03 NOTE — Telephone Encounter (Signed)
Darl Pikes called states since pt started on Midodrine pt has began having hallucinations, severe headaches, nausea, dizziness upon standing.  Please advise in Dr Melvyn Novas absence

## 2013-11-03 NOTE — Telephone Encounter (Signed)
Spoke with Susan advised of MDs message 

## 2013-11-05 ENCOUNTER — Ambulatory Visit (INDEPENDENT_AMBULATORY_CARE_PROVIDER_SITE_OTHER): Payer: Medicare Other | Admitting: Internal Medicine

## 2013-11-05 ENCOUNTER — Encounter: Payer: Self-pay | Admitting: Internal Medicine

## 2013-11-05 VITALS — BP 122/88 | HR 64 | Temp 98.0°F | Wt 193.0 lb

## 2013-11-05 DIAGNOSIS — R443 Hallucinations, unspecified: Secondary | ICD-10-CM

## 2013-11-05 DIAGNOSIS — E119 Type 2 diabetes mellitus without complications: Secondary | ICD-10-CM

## 2013-11-05 DIAGNOSIS — I10 Essential (primary) hypertension: Secondary | ICD-10-CM

## 2013-11-05 MED ORDER — RISPERIDONE 0.25 MG PO TABS: ORAL_TABLET | ORAL | Status: DC

## 2013-11-05 NOTE — Assessment & Plan Note (Signed)
stable overall by history and exam, recent data reviewed with pt, and pt to continue medical treatment as before,  to f/u any worsening symptoms or concerns BP Readings from Last 3 Encounters:  11/05/13 122/88  11/03/13 142/72  09/23/13 102/70

## 2013-11-05 NOTE — Assessment & Plan Note (Signed)
stable overall by history and exam, recent data reviewed with pt, and pt to continue medical treatment as before,  to f/u any worsening symptoms or concerns Lab Results  Component Value Date   HGBA1C 5.9* 12/24/2012

## 2013-11-05 NOTE — Progress Notes (Signed)
Pre-visit discussion using our clinic review tool. No additional management support is needed unless otherwise documented below in the visit note.  

## 2013-11-05 NOTE — Patient Instructions (Addendum)
Please take all new medication as prescribed - the risperidone Please continue all other medications as before, and refills have been done if requested, including the medrol. Please have the pharmacy call with any other refills you may need.  Please return in 4 months, or sooner if needed

## 2013-11-05 NOTE — Progress Notes (Addendum)
Subjective:    Patient ID: [name], male    DOB: [birth year], 79 y.o.   MRN: [id number]  HPI  Here with cousin who drives and o/w supportive.   Seen in ER nov 10, with c/o visual hallucinations, taken off medrol but no with mild arthritic pain getting worse, hallucinations not resolved.  Cousin called rheum, who prefers pt stay on medication. Had mult labs drawn, UA neg, CT head neg for acute, denies depression. Keep falling, no head trauma.  Hospice gives cousin assist with sitter 2 hrs per wk, since wife is under hospice care.  Has low grade temp today, recent UA neg, cxr neg, no ST, cough, dysuria. Still taking oral B12.  Also with nocturia 15 times per night per pt, has urinal by the bed,with occas leakage and accident , has seen urology a few wks ago, neg exam for significant, no procedures needed. CBG 's 59 - 158 at home. On vesicare.   .Pt denies chest pain, increased sob or doe, wheezing, orthopnea, PND, increased LE swelling, palpitations, dizziness or syncope.Pt denies new neurological symptoms such as new headache, or facial or extremity weakness or numbness CT head as above.   Pt denies polydipsia, polyuria, or low sugar symptoms such as weakness or confusion improved with po intake.    Past Medical History  Diagnosis Date  . CAD (coronary artery disease)     s/p NSTEMI and BMS stent diagonal07/09;  Lexiscan Myoview 9/13:  EF 62%, no ischemia  . AS (aortic stenosis)     a.  Echo 2009 mean gradient 9mm HG. AVA 2.18;  b.  Echo 4/11 mild AS mean gradient 10mm HG;  c.  Echo 04/2011: Mild LVH, EF 55-60%, grade 1 diastolic dysfunction, mild aortic stenosis, mean gradient 14, mild LAE;  d. Echo 9/13: mod LVH, EF 50-55%, mild to mod AS, mean gradient 17 mmHg  . AF (paroxysmal atrial fibrillation)     Holter monitor 2.5 sec pauses  . Fatigue     CPX 11/09 VO2  14.4 (75% predicte)d, slope 35.  O2 pulse normal.  VO2 corrected for body weight 17.6.  Marland Kitchen Hypothyroidism   . Hyperlipidemia    . History of prostate cancer   . Allergic rhinitis   . DM (diabetes mellitus)     type II diet controlled  . Shingles   . Left carotid stenosis   . Vitamin B 12 deficiency   . Anemia   . Osteoarthritis   . Nephrolithiasis   . Obesity   . OSA (obstructive sleep apnea)     AHI 15 PSG 09/06/08  . Hx of colonoscopy   . Stroke   . Anxiety and depression   . PMR (polymyalgia rheumatica) 05/29/2012  . Hypogonadism male 05/29/2012  . Hyperparathyroidism 05/29/2012  . TIA (transient ischemic attack) 05/29/2012  . CKD (chronic kidney disease) stage 4, GFR 15-29 ml/min 05/29/2012  . COPD (chronic obstructive pulmonary disease) 05/29/2012  . Myocardial infarction   . Cancer     prostate ca history   Past Surgical History  Procedure Laterality Date  . Appendectomy    . Hernia repair    . Right carpal tunnel release    . Lumbar spine surgery    . Cystectomy      neck  . Prostatectomy    . Vasectomy    . Total knee arthroplasty      right  . Cholecystectomy      reports that he quit smoking about 54 years  ago. His smoking use included Cigarettes. He has a 12.5 pack-year smoking history. He quit smokeless tobacco use about 43 years ago. His smokeless tobacco use included Snuff and Chew. He reports that he does not drink alcohol or use illicit drugs. family history includes Alcohol abuse (age of onset: 49) in his father; Coronary artery disease in an other family member; Stroke (age of onset: 71) in his mother. Allergies  Allergen Reactions  . Bactrim [Sulfamethoxazole-Trimethoprim] Other (See Comments)    hallucinations  . Horse-Derived Products Hives   Current Outpatient Prescriptions on File Prior to Visit  Medication Sig Dispense Refill  . albuterol (PROVENTIL HFA;VENTOLIN HFA) 108 (90 BASE) MCG/ACT inhaler Inhale 2 puffs into the lungs every 6 (six) hours as needed for wheezing or shortness of breath.  1 Inhaler  5  . Ascorbic Acid (VITAMIN C) 500 MG tablet Take 500 mg by mouth at  bedtime.       . cholecalciferol (VITAMIN D) 1000 UNITS tablet Take 1,000 Units by mouth at bedtime.       . clopidogrel (PLAVIX) 75 MG tablet TAKE ONE TABLET BY MOUTH EVERY DAY  90 tablet  3  . diclofenac sodium (VOLTAREN) 1 % GEL Apply 2 g topically 4 (four) times daily.  3 Tube  1  . docusate sodium 100 MG CAPS Take 100 mg by mouth 2 (two) times daily.  10 capsule    . famotidine (PEPCID) 20 MG tablet Take 1 tablet (20 mg total) by mouth 2 (two) times daily.  180 tablet  3  . folic acid (FOLVITE) 1 MG tablet Take 1 mg by mouth daily.      . hyoscyamine (LEVSIN, ANASPAZ) 0.125 MG tablet Take 0.125 mg by mouth 4 (four) times daily as needed. Gas pain.      Marland Kitchen levothyroxine (SYNTHROID, LEVOTHROID) 137 MCG tablet Take 1 tablet (137 mcg total) by mouth daily.  90 tablet  3  . meclizine (ANTIVERT) 25 MG tablet Take 1 tablet (25 mg total) by mouth 3 (three) times daily as needed for dizziness.  30 tablet  0  . methotrexate (RHEUMATREX) 2.5 MG tablet Take 15 mg by mouth once a week. Takes 6 tablets weekly on Wednesday.   Caution:Chemotherapy. Protect from light.      . methylPREDNISolone (MEDROL) 4 MG tablet Take 10 mg by mouth daily.      . midodrine (PROAMATINE) 2.5 MG tablet Take one tablet at 7 am, 12N and 2 pm  90 tablet  6  . pravastatin (PRAVACHOL) 40 MG tablet TAKE ONE TABLET BY MOUTH EVERY DAY  30 tablet  11  . sertraline (ZOLOFT) 100 MG tablet Take 100 mg by mouth at bedtime.      . solifenacin (VESICARE) 10 MG tablet Take 10 mg by mouth at bedtime.       . temazepam (RESTORIL) 15 MG capsule Take 1 capsule (15 mg total) by mouth at bedtime as needed for sleep.  30 capsule  2  . traMADol (ULTRAM) 50 MG tablet Take 1 tablet (50 mg total) by mouth every 6 (six) hours as needed for pain.  120 tablet  2  . [DISCONTINUED] solifenacin (VESICARE) 5 MG tablet Take 5 mg by mouth daily.         No current facility-administered medications on file prior to visit.   Review of Systems  Constitutional:  Negative for unexpected weight change, or unusual diaphoresis  HENT: Negative for tinnitus.   Eyes: Negative for photophobia and visual disturbance.  Respiratory: Negative for choking and stridor.   Gastrointestinal: Negative for vomiting and blood in stool.  Genitourinary: Negative for hematuria and decreased urine volume.  Musculoskeletal: Negative for acute joint swelling Skin: Negative for color change and wound.  Neurological: Negative for tremors and numbness other than noted  Objective:   Physical Exam BP 122/88  Pulse 64  Temp(Src) 98 F (36.7 C) (Oral)  Wt 193 lb (87.544 kg)  SpO2 95% VS noted,  Constitutional: Pt appears well-developed and well-nourished.  HENT: Head: NCAT.  Right Ear: External ear normal.  Left Ear: External ear normal.  Eyes: Conjunctivae and EOM are normal. Pupils are equal, round, and reactive to light.  Neck: Normal range of motion. Neck supple.  Cardiovascular: Normal rate and regular rhythm.   Pulmonary/Chest: Effort normal and breath sounds normal.  Abd:  Soft, NT, non-distended, + BS Neurological: Pt is alert. At baseline mental status per cousin Skin: Skin is warm. No erythema. No ulcers, no erythema Psychiatric: Pt behavior is normal. No active hallucinations or agitation, ? Depressed affect    Assessment & Plan:  Quality Measures addressed:   CHF B-blocker tx for LVSD: pt declines further medication CAD  - drug therapy for lower cholesterol: pt declines further medication ACE/ARB therapy for CAD, Diabetes, and/or LVSD: pt declines further medication

## 2013-11-05 NOTE — Assessment & Plan Note (Addendum)
Exact etiology unclear, for risperidone .25 am, and .5 qhs, avoid sedation, cousin and pt counseled and consent to possible incr stroke risk, and ok with med due to severity of symptoms  Note:  Total time for pt hx, exam, review of record with pt in the room, determination of diagnoses and plan for further eval and tx is > 40 min, with over 50% spent in coordination and counseling of patient

## 2013-11-05 NOTE — Telephone Encounter (Signed)
Pt was taken to the ED for eval of hallunications

## 2013-11-22 ENCOUNTER — Other Ambulatory Visit: Payer: Self-pay | Admitting: Internal Medicine

## 2013-11-24 ENCOUNTER — Telehealth: Payer: Self-pay | Admitting: *Deleted

## 2013-11-24 NOTE — Telephone Encounter (Signed)
Pt called requesting Tramadol refill.  Please advise 

## 2013-11-25 ENCOUNTER — Ambulatory Visit (INDEPENDENT_AMBULATORY_CARE_PROVIDER_SITE_OTHER): Payer: Medicare Other | Admitting: Cardiology

## 2013-11-25 ENCOUNTER — Encounter: Payer: Self-pay | Admitting: Cardiology

## 2013-11-25 VITALS — BP 132/74 | HR 80 | Ht 68.0 in | Wt 197.0 lb

## 2013-11-25 DIAGNOSIS — I350 Nonrheumatic aortic (valve) stenosis: Secondary | ICD-10-CM

## 2013-11-25 DIAGNOSIS — I2510 Atherosclerotic heart disease of native coronary artery without angina pectoris: Secondary | ICD-10-CM

## 2013-11-25 DIAGNOSIS — I951 Orthostatic hypotension: Secondary | ICD-10-CM

## 2013-11-25 DIAGNOSIS — I359 Nonrheumatic aortic valve disorder, unspecified: Secondary | ICD-10-CM

## 2013-11-25 MED ORDER — TRAMADOL HCL 50 MG PO TABS: 50.0000 mg | ORAL_TABLET | Freq: Four times a day (QID) | ORAL | Status: DC | PRN

## 2013-11-25 NOTE — Progress Notes (Signed)
HPI The patient presents for evaluation of CAD, PAF, syncope with orthostasis. Since I last saw him he was in the ER for evaluation of hallucinations. His steroids were reduced.  At the last visit I discontinued fludrocortisone and started ProAmatine.  This seems to have helped with his orthostasis although this is still a problem.  He had two episodes of brief LOC with standing.  However, his friend who comes with him reports that this is better than previous.  He does not need to stand for long periods. He's not describing any chest discomfort, neck or arm discomfort. He's not describing any palpitations.  He has had no acute shortness of breath and has no PND or orthopnea.   Allergies  Allergen Reactions  . Bactrim [Sulfamethoxazole-Trimethoprim] Other (See Comments)    hallucinations  . Horse-Derived Products Hives    Current Outpatient Prescriptions  Medication Sig Dispense Refill  . albuterol (PROVENTIL HFA;VENTOLIN HFA) 108 (90 BASE) MCG/ACT inhaler Inhale 2 puffs into the lungs every 6 (six) hours as needed for wheezing or shortness of breath.  1 Inhaler  5  . Ascorbic Acid (VITAMIN C) 500 MG tablet Take 500 mg by mouth at bedtime.       . cholecalciferol (VITAMIN D) 1000 UNITS tablet Take 1,000 Units by mouth at bedtime.       . clopidogrel (PLAVIX) 75 MG tablet TAKE ONE TABLET BY MOUTH EVERY DAY  90 tablet  3  . diclofenac sodium (VOLTAREN) 1 % GEL Apply 2 g topically 4 (four) times daily.  3 Tube  1  . docusate sodium 100 MG CAPS Take 100 mg by mouth 2 (two) times daily.  10 capsule    . famotidine (PEPCID) 20 MG tablet Take 1 tablet (20 mg total) by mouth 2 (two) times daily.  180 tablet  3  . folic acid (FOLVITE) 1 MG tablet Take 1 mg by mouth daily.      . hyoscyamine (LEVSIN, ANASPAZ) 0.125 MG tablet Take 0.125 mg by mouth 4 (four) times daily as needed. Gas pain.      Marland Kitchen levothyroxine (SYNTHROID, LEVOTHROID) 137 MCG tablet TAKE ONE TABLET BY MOUTH EVERY DAY  90 tablet  3  .  meclizine (ANTIVERT) 25 MG tablet Take 1 tablet (25 mg total) by mouth 3 (three) times daily as needed for dizziness.  30 tablet  0  . methotrexate (RHEUMATREX) 2.5 MG tablet Take 15 mg by mouth once a week. Takes 6 tablets weekly on Wednesday.   Caution:Chemotherapy. Protect from light.      . methylPREDNISolone (MEDROL) 4 MG tablet Take 8 mg by mouth daily.       . midodrine (PROAMATINE) 2.5 MG tablet Take one tablet at 7 am, 12N and 2 pm  90 tablet  6  . mirabegron ER (MYRBETRIQ) 25 MG TB24 tablet Take 25 mg by mouth at bedtime.      . pravastatin (PRAVACHOL) 40 MG tablet TAKE ONE TABLET BY MOUTH EVERY DAY  30 tablet  11  . risperiDONE (RISPERDAL) 0.25 MG tablet 1 tab by mouth in the AM and 2 tab in the PM  90 tablet  3  . sertraline (ZOLOFT) 100 MG tablet Take 100 mg by mouth at bedtime.      . temazepam (RESTORIL) 15 MG capsule Take 1 capsule (15 mg total) by mouth at bedtime as needed for sleep.  30 capsule  2  . traMADol (ULTRAM) 50 MG tablet Take 1 tablet (50 mg total) by mouth  every 6 (six) hours as needed for pain.  120 tablet  2  . [DISCONTINUED] solifenacin (VESICARE) 5 MG tablet Take 5 mg by mouth daily.         No current facility-administered medications for this visit.    Past Medical History  Diagnosis Date  . CAD (coronary artery disease)     s/p NSTEMI and BMS stent diagonal07/09;  Lexiscan Myoview 9/13:  EF 62%, no ischemia  . AS (aortic stenosis)     a.  Echo 2009 mean gradient 9mm HG. AVA 2.18;  b.  Echo 4/11 mild AS mean gradient 10mm HG;  c.  Echo 04/2011: Mild LVH, EF 55-60%, grade 1 diastolic dysfunction, mild aortic stenosis, mean gradient 14, mild LAE;  d. Echo 9/13: mod LVH, EF 50-55%, mild to mod AS, mean gradient 17 mmHg  . AF (paroxysmal atrial fibrillation)     Holter monitor 2.5 sec pauses  . Fatigue     CPX 11/09 VO2  14.4 (75% predicte)d, slope 35.  O2 pulse normal.  VO2 corrected for body weight 17.6.  Marland Kitchen Hypothyroidism   . Hyperlipidemia   . History of  prostate cancer   . Allergic rhinitis   . DM (diabetes mellitus)     type II diet controlled  . Shingles   . Left carotid stenosis   . Vitamin B 12 deficiency   . Anemia   . Osteoarthritis   . Nephrolithiasis   . Obesity   . OSA (obstructive sleep apnea)     AHI 15 PSG 09/06/08  . Hx of colonoscopy   . Stroke   . Anxiety and depression   . PMR (polymyalgia rheumatica) 05/29/2012  . Hypogonadism male 05/29/2012  . Hyperparathyroidism 05/29/2012  . TIA (transient ischemic attack) 05/29/2012  . CKD (chronic kidney disease) stage 4, GFR 15-29 ml/min 05/29/2012  . COPD (chronic obstructive pulmonary disease) 05/29/2012  . Myocardial infarction   . Cancer     prostate ca history    Past Surgical History  Procedure Laterality Date  . Appendectomy    . Hernia repair    . Right carpal tunnel release    . Lumbar spine surgery    . Cystectomy      neck  . Prostatectomy    . Vasectomy    . Total knee arthroplasty      right  . Cholecystectomy      ROS:  As stated in the HPI and negative for all other systems.  PHYSICAL EXAM BP 132/74  Pulse 80  Ht 5\' 8"  (1.727 m)  Wt 197 lb (89.359 kg)  BMI 29.96 kg/m2 GEN:  No distress, frail appearing NECK:  No jugular venous distention at 90 degrees, waveform within normal limits, carotid upstroke brisk and symmetric, no bruits, no thyromegaly LUNGS:  Clear to auscultation bilaterally CHEST:  Unremarkable HEART:  S1 and S2 within normal limits, no S3, no S4, no clicks, no rubs, no murmurs, distant heart sounds ABD:  Positive bowel sounds normal in frequency in pitch, no bruits, no rebound, no guarding, unable to assess midline mass or bruit with the patient seated. EXT:  2 plus pulses throughout, moderate edema, no cyanosis no clubbing SKIN:  No rashes no nodules, bruising  ASSESSMENT AND PLAN  Orthostasis:  He seems to be slightly better with the change in medications. We discussed wearing the compression stockings more frequently. Otherwise  I will not change his medications. He has had some hypertensive blood pressure responses and I would prefer  not to up titrate his medication.  Chronic Diastolic CHF: He seems to be euvolemic. I will make changes as above.   Aortic Stenosis: Mild by last echo. I will follow this clinically and do not plan repeat imaging at this time.  CAD:  The patient has no new sypmtoms.  No further cardiovascular testing is indicated.  We will continue with aggressive risk reduction and meds as listed.  Atrial Fibrillation: Maintaining NSR. He is not a warfarin candidate with his frailty. He had no arrhythmia on the recent device check.

## 2013-11-25 NOTE — Telephone Encounter (Signed)
Done hardcopy to robin  

## 2013-11-25 NOTE — Patient Instructions (Signed)
The current medical regimen is effective;  continue present plan and medications.  Follow up in 6 months with Dr Hochrein.  You will receive a letter in the mail 2 months before you are due.  Please call us when you receive this letter to schedule your follow up appointment.  

## 2013-11-26 NOTE — Telephone Encounter (Signed)
Faxed hardcopy to [employer]  And informed the patient.

## 2013-12-12 ENCOUNTER — Ambulatory Visit: Payer: Medicare Other | Admitting: Internal Medicine

## 2013-12-20 ENCOUNTER — Other Ambulatory Visit: Payer: Self-pay | Admitting: Internal Medicine

## 2013-12-20 NOTE — Telephone Encounter (Signed)
Faxed hardcopy to [employer] [place]

## 2013-12-20 NOTE — Telephone Encounter (Signed)
Done hardcopy to robin  

## 2014-01-09 ENCOUNTER — Encounter: Payer: Self-pay | Admitting: Internal Medicine

## 2014-01-09 ENCOUNTER — Ambulatory Visit (INDEPENDENT_AMBULATORY_CARE_PROVIDER_SITE_OTHER): Payer: Medicare Other | Admitting: Internal Medicine

## 2014-01-09 ENCOUNTER — Telehealth: Payer: Self-pay | Admitting: Internal Medicine

## 2014-01-09 ENCOUNTER — Ambulatory Visit (INDEPENDENT_AMBULATORY_CARE_PROVIDER_SITE_OTHER)
Admission: RE | Admit: 2014-01-09 | Discharge: 2014-01-09 | Disposition: A | Discharge status: Home / Self Care | Payer: Medicare Other | Source: Ambulatory Visit | Attending: Internal Medicine | Admitting: Internal Medicine

## 2014-01-09 ENCOUNTER — Other Ambulatory Visit (INDEPENDENT_AMBULATORY_CARE_PROVIDER_SITE_OTHER): Payer: Medicare Other

## 2014-01-09 VITALS — BP 102/72 | HR 81 | Temp 98.5°F | Wt 197.0 lb

## 2014-01-09 DIAGNOSIS — R269 Unspecified abnormalities of gait and mobility: Secondary | ICD-10-CM

## 2014-01-09 DIAGNOSIS — R443 Hallucinations, unspecified: Secondary | ICD-10-CM

## 2014-01-09 DIAGNOSIS — M353 Polymyalgia rheumatica: Secondary | ICD-10-CM

## 2014-01-09 DIAGNOSIS — F0391 Unspecified dementia with behavioral disturbance: Secondary | ICD-10-CM | POA: Insufficient documentation

## 2014-01-09 DIAGNOSIS — F0390 Unspecified dementia without behavioral disturbance: Secondary | ICD-10-CM

## 2014-01-09 DIAGNOSIS — F03918 Unspecified dementia, unspecified severity, with other behavioral disturbance: Secondary | ICD-10-CM | POA: Insufficient documentation

## 2014-01-09 LAB — BASIC METABOLIC PANEL
BUN: 23 mg/dL (ref 6–23)
CALCIUM: 8.7 mg/dL (ref 8.4–10.5)
CO2: 29 mEq/L (ref 19–32)
CREATININE: 1.6 mg/dL — AB (ref 0.4–1.5)
Chloride: 103 mEq/L (ref 96–112)
GFR: 44.44 mL/min — AB (ref >60.00)
Glucose, Bld: 120 mg/dL — ABNORMAL HIGH (ref 70–99)
Potassium: 4.6 mEq/L (ref 3.5–5.1)
Sodium: 138 mEq/L (ref 135–145)

## 2014-01-09 LAB — URINALYSIS, ROUTINE W REFLEX MICROSCOPIC
BILIRUBIN URINE: NEGATIVE
Hgb urine dipstick: NEGATIVE
Leukocytes, UA: NEGATIVE
Nitrite: NEGATIVE
PH: 5.5 (ref 5.0–8.0)
Total Protein, Urine: NEGATIVE
Urine Glucose: NEGATIVE
Urobilinogen, UA: 0.2 (ref 0.0–1.0)

## 2014-01-09 LAB — HEPATIC FUNCTION PANEL
ALT: 22 U/L (ref 0–53)
AST: 22 U/L (ref 0–37)
Albumin: 3.7 g/dL (ref 3.5–5.2)
Alkaline Phosphatase: 69 U/L (ref 39–117)
BILIRUBIN DIRECT: 0.1 mg/dL (ref 0.0–0.3)
Total Bilirubin: 0.8 mg/dL (ref 0.3–1.2)
Total Protein: 6.2 g/dL (ref 6.0–8.3)

## 2014-01-09 LAB — CBC WITH DIFFERENTIAL/PLATELET
BASOS ABS: 0 10*3/uL (ref 0.0–0.1)
Basophils Relative: 0.3 % (ref 0.0–3.0)
Eosinophils Absolute: 0.1 10*3/uL (ref 0.0–0.7)
Eosinophils Relative: 0.6 % (ref 0.0–5.0)
HCT: 40.3 % (ref 39.0–52.0)
Hemoglobin: 13.6 g/dL (ref 13.0–17.0)
LYMPHS ABS: 1.5 10*3/uL (ref 0.7–4.0)
LYMPHS PCT: 16.4 % (ref 12.0–46.0)
MCHC: 33.6 g/dL (ref 30.0–36.0)
MCV: 103.7 fl — ABNORMAL HIGH (ref 78.0–100.0)
MONOS PCT: 4.9 % (ref 3.0–12.0)
Monocytes Absolute: 0.4 10*3/uL (ref 0.1–1.0)
NEUTROS PCT: 77.8 % — AB (ref 43.0–77.0)
Neutro Abs: 7.1 10*3/uL (ref 1.4–7.7)
PLATELETS: 312 10*3/uL (ref 150.0–400.0)
RBC: 3.89 Mil/uL — ABNORMAL LOW (ref 4.22–5.81)
RDW: 15.6 % — AB (ref 11.5–14.6)
WBC: 9.1 10*3/uL (ref 4.5–10.5)

## 2014-01-09 MED ORDER — LEVOFLOXACIN 250 MG PO TABS: 250.0000 mg | ORAL_TABLET | Freq: Every day | ORAL | Status: DC

## 2014-01-09 MED ORDER — QUETIAPINE FUMARATE 50 MG PO TABS: 50.0000 mg | ORAL_TABLET | Freq: Every day | ORAL | Status: DC

## 2014-01-09 NOTE — Assessment & Plan Note (Signed)
To cont methylpred for now

## 2014-01-09 NOTE — Patient Instructions (Signed)
Please take all new medication as prescribed - the seroquel OK to stop the temazepam Please continue all other medications as before Please have the pharmacy call with any other refills you may need.  Please go to the XRAY Department in the Basement (go straight as you get off the elevator) for the x-ray testing  Please go to the LAB in the Basement (turn left off the elevator) for the tests to be done today  You will be contacted by phone if any changes need to be made immediately.  Otherwise, you will receive a letter about your results with an explanation, but please check with MyChart first

## 2014-01-09 NOTE — Assessment & Plan Note (Signed)
For home PT/ home health

## 2014-01-09 NOTE — Assessment & Plan Note (Addendum)
Possible, but will need eval as above, consider neurology eval, formal MMSE not done

## 2014-01-09 NOTE — Progress Notes (Signed)
Pre-visit discussion using our clinic review tool. No additional management support is needed unless otherwise documented below in the visit note.  

## 2014-01-09 NOTE — Progress Notes (Signed)
Subjective:    Patient ID: [name], male    DOB: 1923/06/25, 80 y.o.   MRN: [id number]  HPI  Here with son and daughter, they mention worsening frequent numerous types of visual hallucinations, worsening general weakness, fell last wk but no overt injury.  Pt denies chest pain, increased sob or doe, wheezing, orthopnea, PND, increased LE swelling, palpitations, dizziness or syncope. Pt denies new neurological symptoms such as new headache, or facial or extremity weakness or numbness   Pt denies polydipsia, polyuria,  Pt states overall good compliance with meds.  Current steroid seem to be causes worsening agitation and hallucinations but prednisone may have been worse.   Also temazepam no longer covered by insurance, has stopped.  Hard to get to sleep still.  Risperdal no longer covered by insurance. Has some worsening short term memory it seems, long term ok, now also more bowel and bladder incontinence, maybe more in the past wk. No overt fever, cough, and Denies urinary symptoms such as dysuria, frequency, urgency, flank pain, hematuria or n/v, fever, chills except for the above Past Medical History  Diagnosis Date  . CAD (coronary artery disease)     s/p NSTEMI and BMS stent diagonal07/09;  Lexiscan Myoview 9/13:  EF 62%, no ischemia  . AS (aortic stenosis)     a.  Echo 2009 mean gradient 67mm HG. AVA 2.18;  b.  Echo 4/11 mild AS mean gradient 9mm HG;  c.  Echo 04/2011: Mild LVH, EF 23-53%, grade 1 diastolic dysfunction, mild aortic stenosis, mean gradient 14, mild LAE;  d. Echo 9/13: mod LVH, EF 50-55%, mild to mod AS, mean gradient 17 mmHg  . AF (paroxysmal atrial fibrillation)     Holter monitor 2.5 sec pauses  . Fatigue     CPX 11/09 VO2  14.4 (75% predicte)d, slope 35.  O2 pulse normal.  VO2 corrected for body weight 17.6.  Marland Kitchen Hypothyroidism   . Hyperlipidemia   . History of prostate cancer   . Allergic rhinitis   . DM (diabetes mellitus)     type II diet controlled  . Shingles    . Left carotid stenosis   . Vitamin B 12 deficiency   . Anemia   . Osteoarthritis   . Nephrolithiasis   . Obesity   . OSA (obstructive sleep apnea)     AHI 15 PSG 09/06/08  . Hx of colonoscopy   . Stroke   . Anxiety and depression   . PMR (polymyalgia rheumatica) 05/29/2012  . Hypogonadism male 05/29/2012  . Hyperparathyroidism 05/29/2012  . TIA (transient ischemic attack) 05/29/2012  . CKD (chronic kidney disease) stage 4, GFR 15-29 ml/min 05/29/2012  . COPD (chronic obstructive pulmonary disease) 05/29/2012  . Myocardial infarction   . Cancer     prostate ca history   Past Surgical History  Procedure Laterality Date  . Appendectomy    . Hernia repair    . Right carpal tunnel release    . Lumbar spine surgery    . Cystectomy      neck  . Prostatectomy    . Vasectomy    . Total knee arthroplasty      right  . Cholecystectomy      reports that he quit smoking about 55 years ago. His smoking use included Cigarettes. He has a 12.5 pack-year smoking history. He quit smokeless tobacco use about 43 years ago. His smokeless tobacco use included Snuff and Chew. He reports that he does not drink  alcohol or use illicit drugs. family history includes Alcohol abuse (age of onset: 25) in his father; Coronary artery disease in an other family member; Stroke (age of onset: 17) in his mother. Allergies  Allergen Reactions  . Bactrim [Sulfamethoxazole-Trimethoprim] Other (See Comments)    hallucinations  . Horse-Derived Products Hives   Current Outpatient Prescriptions on File Prior to Visit  Medication Sig Dispense Refill  . albuterol (PROVENTIL HFA;VENTOLIN HFA) 108 (90 BASE) MCG/ACT inhaler Inhale 2 puffs into the lungs every 6 (six) hours as needed for wheezing or shortness of breath.  1 Inhaler  5  . Ascorbic Acid (VITAMIN C) 500 MG tablet Take 500 mg by mouth at bedtime.       . cholecalciferol (VITAMIN D) 1000 UNITS tablet Take 1,000 Units by mouth at bedtime.       . clopidogrel  (PLAVIX) 75 MG tablet TAKE ONE TABLET BY MOUTH EVERY DAY  90 tablet  3  . diclofenac sodium (VOLTAREN) 1 % GEL Apply 2 g topically 4 (four) times daily.  3 Tube  1  . docusate sodium 100 MG CAPS Take 100 mg by mouth 2 (two) times daily.  10 capsule    . famotidine (PEPCID) 20 MG tablet Take 1 tablet (20 mg total) by mouth 2 (two) times daily.  99991111 tablet  3  . folic acid (FOLVITE) 1 MG tablet Take 1 mg by mouth daily.      . hyoscyamine (LEVSIN, ANASPAZ) 0.125 MG tablet Take 0.125 mg by mouth 4 (four) times daily as needed. Gas pain.      Marland Kitchen levothyroxine (SYNTHROID, LEVOTHROID) 137 MCG tablet TAKE ONE TABLET BY MOUTH EVERY DAY  90 tablet  3  . meclizine (ANTIVERT) 25 MG tablet Take 1 tablet (25 mg total) by mouth 3 (three) times daily as needed for dizziness.  30 tablet  0  . methotrexate (RHEUMATREX) 2.5 MG tablet Take 15 mg by mouth once a week. Takes 6 tablets weekly on Wednesday.   Caution:Chemotherapy. Protect from light.      . methylPREDNISolone (MEDROL) 4 MG tablet Take 8 mg by mouth daily.       . midodrine (PROAMATINE) 2.5 MG tablet Take one tablet at 7 am, 12N and 2 pm  90 tablet  6  . mirabegron ER (MYRBETRIQ) 25 MG TB24 tablet Take 25 mg by mouth at bedtime.      . pravastatin (PRAVACHOL) 40 MG tablet TAKE ONE TABLET BY MOUTH EVERY DAY  30 tablet  11  . risperiDONE (RISPERDAL) 0.25 MG tablet 1 tab by mouth in the AM and 2 tab in the PM  90 tablet  3  . sertraline (ZOLOFT) 100 MG tablet Take 100 mg by mouth at bedtime.      . traMADol (ULTRAM) 50 MG tablet Take 1 tablet (50 mg total) by mouth every 6 (six) hours as needed.  120 tablet  2  . [DISCONTINUED] solifenacin (VESICARE) 5 MG tablet Take 5 mg by mouth daily.         No current facility-administered medications on file prior to visit.   Review of Systems Pt unwilling to answer, having several hallucinations in office - seeing rats in the corner    Objective:   Physical Exam BP 102/72  Pulse 81  Temp(Src) 98.5 F (36.9 C)  (Oral)  Wt 197 lb (89.359 kg)  SpO2 93% VS noted, chronic ill appearing, Constitutional: Pt appears obese, fatigue, cannot stand without assist from wheelchair  HENT: Head: NCAT.  Right Ear: External ear normal.  Left Ear: External ear normal.  Eyes: Conjunctivae and EOM are normal. Pupils are equal, round, and reactive to light.  Neck: Normal range of motion. Neck supple.  Cardiovascular: Normal rate and regular rhythm.   Pulmonary/Chest: Effort normal and breath sounds without rales or wheezing.  Abd:  Soft, NT, non-distended, + BS Neurological: Pt is alert. + confused  Skin: Skin is warm. No erythema.  Psychiatric: Pt behavior is otherwise normal.  - not agitated    Assessment & Plan:

## 2014-01-09 NOTE — Assessment & Plan Note (Addendum)
Cant r/o dementia vs depression vs other such as underlying infectios stressor - ok for change risperdal to seroquel, also for lab, cxr, UA  Note:  Total time for pt hx, exam, review of record with pt in the room, determination of diagnoses and plan for further eval and tx is > 40 min, with over 50% spent in coordination and counseling of patient

## 2014-01-09 NOTE — Telephone Encounter (Signed)
Spoke by phone to "joe" the neighbor staying with [name] in the home for just a short time today, as his regular caretaker went to the store  I asked joe to let the caretaker, or son or daughter know that there may be right pna, and an antibx will be sent to his pharmacy

## 2014-01-15 ENCOUNTER — Emergency Department (HOSPITAL_COMMUNITY): Payer: Medicare Other

## 2014-01-15 ENCOUNTER — Encounter (HOSPITAL_COMMUNITY): Payer: Self-pay | Admitting: Emergency Medicine

## 2014-01-15 ENCOUNTER — Inpatient Hospital Stay (HOSPITAL_COMMUNITY)
Admission: EM | Admit: 2014-01-15 | Discharge: 2014-01-21 | DRG: 194 | Disposition: A | Discharge status: Skilled Nursing Facility | Payer: Medicare Other | Attending: Internal Medicine | Admitting: Internal Medicine

## 2014-01-15 DIAGNOSIS — E875 Hyperkalemia: Secondary | ICD-10-CM

## 2014-01-15 DIAGNOSIS — R042 Hemoptysis: Secondary | ICD-10-CM

## 2014-01-15 DIAGNOSIS — I10 Essential (primary) hypertension: Secondary | ICD-10-CM

## 2014-01-15 DIAGNOSIS — F411 Generalized anxiety disorder: Secondary | ICD-10-CM | POA: Diagnosis present

## 2014-01-15 DIAGNOSIS — I350 Nonrheumatic aortic (valve) stenosis: Secondary | ICD-10-CM

## 2014-01-15 DIAGNOSIS — R5381 Other malaise: Secondary | ICD-10-CM

## 2014-01-15 DIAGNOSIS — L559 Sunburn, unspecified: Secondary | ICD-10-CM

## 2014-01-15 DIAGNOSIS — E876 Hypokalemia: Secondary | ICD-10-CM

## 2014-01-15 DIAGNOSIS — L259 Unspecified contact dermatitis, unspecified cause: Secondary | ICD-10-CM

## 2014-01-15 DIAGNOSIS — D179 Benign lipomatous neoplasm, unspecified: Secondary | ICD-10-CM

## 2014-01-15 DIAGNOSIS — J4489 Other specified chronic obstructive pulmonary disease: Secondary | ICD-10-CM | POA: Diagnosis present

## 2014-01-15 DIAGNOSIS — N200 Calculus of kidney: Secondary | ICD-10-CM

## 2014-01-15 DIAGNOSIS — F419 Anxiety disorder, unspecified: Secondary | ICD-10-CM

## 2014-01-15 DIAGNOSIS — D510 Vitamin B12 deficiency anemia due to intrinsic factor deficiency: Secondary | ICD-10-CM

## 2014-01-15 DIAGNOSIS — J449 Chronic obstructive pulmonary disease, unspecified: Secondary | ICD-10-CM

## 2014-01-15 DIAGNOSIS — K625 Hemorrhage of anus and rectum: Secondary | ICD-10-CM

## 2014-01-15 DIAGNOSIS — I129 Hypertensive chronic kidney disease with stage 1 through stage 4 chronic kidney disease, or unspecified chronic kidney disease: Secondary | ICD-10-CM | POA: Diagnosis present

## 2014-01-15 DIAGNOSIS — K120 Recurrent oral aphthae: Secondary | ICD-10-CM

## 2014-01-15 DIAGNOSIS — J90 Pleural effusion, not elsewhere classified: Secondary | ICD-10-CM

## 2014-01-15 DIAGNOSIS — I480 Paroxysmal atrial fibrillation: Secondary | ICD-10-CM

## 2014-01-15 DIAGNOSIS — Z9889 Other specified postprocedural states: Secondary | ICD-10-CM

## 2014-01-15 DIAGNOSIS — T50995A Adverse effect of other drugs, medicaments and biological substances, initial encounter: Secondary | ICD-10-CM

## 2014-01-15 DIAGNOSIS — F32A Depression, unspecified: Secondary | ICD-10-CM

## 2014-01-15 DIAGNOSIS — F3289 Other specified depressive episodes: Secondary | ICD-10-CM

## 2014-01-15 DIAGNOSIS — F0391 Unspecified dementia with behavioral disturbance: Secondary | ICD-10-CM | POA: Diagnosis present

## 2014-01-15 DIAGNOSIS — I4891 Unspecified atrial fibrillation: Secondary | ICD-10-CM | POA: Diagnosis present

## 2014-01-15 DIAGNOSIS — Z8546 Personal history of malignant neoplasm of prostate: Secondary | ICD-10-CM

## 2014-01-15 DIAGNOSIS — E785 Hyperlipidemia, unspecified: Secondary | ICD-10-CM

## 2014-01-15 DIAGNOSIS — R5383 Other fatigue: Secondary | ICD-10-CM

## 2014-01-15 DIAGNOSIS — N19 Unspecified kidney failure: Secondary | ICD-10-CM

## 2014-01-15 DIAGNOSIS — D649 Anemia, unspecified: Secondary | ICD-10-CM

## 2014-01-15 DIAGNOSIS — E119 Type 2 diabetes mellitus without complications: Secondary | ICD-10-CM

## 2014-01-15 DIAGNOSIS — R443 Hallucinations, unspecified: Secondary | ICD-10-CM

## 2014-01-15 DIAGNOSIS — H109 Unspecified conjunctivitis: Secondary | ICD-10-CM

## 2014-01-15 DIAGNOSIS — I4892 Unspecified atrial flutter: Secondary | ICD-10-CM

## 2014-01-15 DIAGNOSIS — I6522 Occlusion and stenosis of left carotid artery: Secondary | ICD-10-CM

## 2014-01-15 DIAGNOSIS — N184 Chronic kidney disease, stage 4 (severe): Secondary | ICD-10-CM

## 2014-01-15 DIAGNOSIS — E538 Deficiency of other specified B group vitamins: Secondary | ICD-10-CM

## 2014-01-15 DIAGNOSIS — R252 Cramp and spasm: Secondary | ICD-10-CM

## 2014-01-15 DIAGNOSIS — M1990 Unspecified osteoarthritis, unspecified site: Secondary | ICD-10-CM

## 2014-01-15 DIAGNOSIS — Z8673 Personal history of transient ischemic attack (TIA), and cerebral infarction without residual deficits: Secondary | ICD-10-CM

## 2014-01-15 DIAGNOSIS — E213 Hyperparathyroidism, unspecified: Secondary | ICD-10-CM

## 2014-01-15 DIAGNOSIS — J309 Allergic rhinitis, unspecified: Secondary | ICD-10-CM

## 2014-01-15 DIAGNOSIS — I359 Nonrheumatic aortic valve disorder, unspecified: Secondary | ICD-10-CM

## 2014-01-15 DIAGNOSIS — F03918 Unspecified dementia, unspecified severity, with other behavioral disturbance: Secondary | ICD-10-CM | POA: Diagnosis present

## 2014-01-15 DIAGNOSIS — R42 Dizziness and giddiness: Secondary | ICD-10-CM | POA: Diagnosis present

## 2014-01-15 DIAGNOSIS — G459 Transient cerebral ischemic attack, unspecified: Secondary | ICD-10-CM

## 2014-01-15 DIAGNOSIS — D638 Anemia in other chronic diseases classified elsewhere: Secondary | ICD-10-CM | POA: Diagnosis present

## 2014-01-15 DIAGNOSIS — R32 Unspecified urinary incontinence: Secondary | ICD-10-CM

## 2014-01-15 DIAGNOSIS — J189 Pneumonia, unspecified organism: Secondary | ICD-10-CM

## 2014-01-15 DIAGNOSIS — F329 Major depressive disorder, single episode, unspecified: Secondary | ICD-10-CM | POA: Diagnosis present

## 2014-01-15 DIAGNOSIS — M549 Dorsalgia, unspecified: Secondary | ICD-10-CM

## 2014-01-15 DIAGNOSIS — I951 Orthostatic hypotension: Secondary | ICD-10-CM

## 2014-01-15 DIAGNOSIS — Z87891 Personal history of nicotine dependence: Secondary | ICD-10-CM

## 2014-01-15 DIAGNOSIS — Z66 Do not resuscitate: Secondary | ICD-10-CM | POA: Diagnosis present

## 2014-01-15 DIAGNOSIS — M353 Polymyalgia rheumatica: Secondary | ICD-10-CM

## 2014-01-15 DIAGNOSIS — E669 Obesity, unspecified: Secondary | ICD-10-CM

## 2014-01-15 DIAGNOSIS — G4700 Insomnia, unspecified: Secondary | ICD-10-CM

## 2014-01-15 DIAGNOSIS — R55 Syncope and collapse: Secondary | ICD-10-CM

## 2014-01-15 DIAGNOSIS — J939 Pneumothorax, unspecified: Secondary | ICD-10-CM

## 2014-01-15 DIAGNOSIS — R269 Unspecified abnormalities of gait and mobility: Secondary | ICD-10-CM

## 2014-01-15 DIAGNOSIS — K5900 Constipation, unspecified: Secondary | ICD-10-CM

## 2014-01-15 DIAGNOSIS — M25512 Pain in left shoulder: Secondary | ICD-10-CM

## 2014-01-15 DIAGNOSIS — F0390 Unspecified dementia without behavioral disturbance: Secondary | ICD-10-CM

## 2014-01-15 DIAGNOSIS — Z0000 Encounter for general adult medical examination without abnormal findings: Secondary | ICD-10-CM

## 2014-01-15 DIAGNOSIS — E291 Testicular hypofunction: Secondary | ICD-10-CM

## 2014-01-15 DIAGNOSIS — M7150 Other bursitis, not elsewhere classified, unspecified site: Secondary | ICD-10-CM

## 2014-01-15 DIAGNOSIS — R51 Headache: Secondary | ICD-10-CM

## 2014-01-15 DIAGNOSIS — I2510 Atherosclerotic heart disease of native coronary artery without angina pectoris: Secondary | ICD-10-CM

## 2014-01-15 DIAGNOSIS — R609 Edema, unspecified: Secondary | ICD-10-CM

## 2014-01-15 DIAGNOSIS — B029 Zoster without complications: Secondary | ICD-10-CM

## 2014-01-15 DIAGNOSIS — G4733 Obstructive sleep apnea (adult) (pediatric): Secondary | ICD-10-CM

## 2014-01-15 DIAGNOSIS — R531 Weakness: Secondary | ICD-10-CM

## 2014-01-15 DIAGNOSIS — W19XXXA Unspecified fall, initial encounter: Secondary | ICD-10-CM

## 2014-01-15 DIAGNOSIS — M25511 Pain in right shoulder: Secondary | ICD-10-CM

## 2014-01-15 DIAGNOSIS — I252 Old myocardial infarction: Secondary | ICD-10-CM

## 2014-01-15 DIAGNOSIS — IMO0002 Reserved for concepts with insufficient information to code with codable children: Secondary | ICD-10-CM

## 2014-01-15 DIAGNOSIS — Z96659 Presence of unspecified artificial knee joint: Secondary | ICD-10-CM

## 2014-01-15 DIAGNOSIS — E039 Hypothyroidism, unspecified: Secondary | ICD-10-CM

## 2014-01-15 DIAGNOSIS — M722 Plantar fascial fibromatosis: Secondary | ICD-10-CM

## 2014-01-15 LAB — CBC
HCT: 41.2 % (ref 39.0–52.0)
Hemoglobin: 13.7 g/dL (ref 13.0–17.0)
MCH: 35.2 pg — ABNORMAL HIGH (ref 26.0–34.0)
MCHC: 33.3 g/dL (ref 30.0–36.0)
MCV: 105.9 fL — ABNORMAL HIGH (ref 78.0–100.0)
Platelets: 307 10*3/uL (ref 150–400)
RBC: 3.89 MIL/uL — ABNORMAL LOW (ref 4.22–5.81)
RDW: 14.9 % (ref 11.5–15.5)
WBC: 14 10*3/uL — ABNORMAL HIGH (ref 4.0–10.5)

## 2014-01-15 LAB — TROPONIN I: Troponin I: <0.3 ng/mL (ref <0.30)

## 2014-01-15 LAB — BASIC METABOLIC PANEL
BUN: 33 mg/dL — ABNORMAL HIGH (ref 6–23)
CO2: 24 meq/L (ref 19–32)
CREATININE: 1.65 mg/dL — AB (ref 0.50–1.35)
Calcium: 8.8 mg/dL (ref 8.4–10.5)
Chloride: 101 mEq/L (ref 96–112)
GFR calc non Af Amer: 35 mL/min — ABNORMAL LOW (ref >90)
GFR, EST AFRICAN AMERICAN: 41 mL/min — AB (ref >90)
Glucose, Bld: 102 mg/dL — ABNORMAL HIGH (ref 70–99)
Potassium: 4.7 mEq/L (ref 3.7–5.3)
Sodium: 136 mEq/L — ABNORMAL LOW (ref 137–147)

## 2014-01-15 LAB — URINALYSIS, ROUTINE W REFLEX MICROSCOPIC
BILIRUBIN URINE: NEGATIVE
GLUCOSE, UA: NEGATIVE mg/dL
HGB URINE DIPSTICK: NEGATIVE
KETONES UR: NEGATIVE mg/dL
Leukocytes, UA: NEGATIVE
Nitrite: NEGATIVE
PH: 5 (ref 5.0–8.0)
Protein, ur: NEGATIVE mg/dL
Specific Gravity, Urine: 1.012 (ref 1.005–1.030)
Urobilinogen, UA: 0.2 mg/dL (ref 0.0–1.0)

## 2014-01-15 LAB — POCT I-STAT TROPONIN I: Troponin i, poc: 0.02 ng/mL (ref 0.00–0.08)

## 2014-01-15 MED ORDER — DEXTROSE 5 % IV SOLN
500.0000 mg | Freq: Once | INTRAVENOUS | Status: AC
  Administered 2014-01-16: 500 mg via INTRAVENOUS
  Filled 2014-01-15: qty 500

## 2014-01-15 MED ORDER — DEXTROSE 5 % IV SOLN
1.0000 g | Freq: Once | INTRAVENOUS | Status: AC
  Administered 2014-01-15: 1 g via INTRAVENOUS
  Filled 2014-01-15: qty 10

## 2014-01-15 NOTE — ED Notes (Signed)
Patient and family requesting foley placement. Patient states he has been urinating every 30 minutes.

## 2014-01-15 NOTE — ED Notes (Addendum)
Pt a+ox4, presents from home with dtr with c/o HTN today (160/98) while at home, also reportedly "passed out a few times today".  Per dtr, pt also with visual hallucinations "for a few weeks now".  Pt denies CP/palpitations, denies SOB, n/v/d/c.  Pt reports "a little" diffuse HA.  Pt denies dizziness, +generalized weakness, pt remembers syncopal episodes but cannot remember what led up to it.  -facial asymmetry, tongue midline, speech clear.  PERRLA.  Speaking full sentences, rr even/un-lab, ls congested, poor effort.  Maei.  +csm/+pulses.  Skin pwd.  Dtr reports PCP appt last week "they thought he had a pneumonia", dtr also reports foul smelling urine.

## 2014-01-15 NOTE — ED Provider Notes (Signed)
CSN: [id number]     Arrival date & time 01/15/14  1805 History   First MD Initiated Contact with Patient 01/15/14 1954     Chief Complaint  Patient presents with  . Hypertension  . Loss of Consciousness   (Consider location/radiation/quality/duration/timing/severity/associated sxs/prior Treatment) HPI [name] is an 80 yo male with a PMH of TIA & carotid stenosis, who presents today for 2-3 weeks of slowly worsening hallucinations, lightheadedness, weakness, and headache. The hallucinations are complex with visualizations of people, and objects (bikes, watermelons). His headaches are frontal, bilateral, and worst in the AM. They are partially relieved by coffee. The weakness and lightheadedness have led to an increasing frequency of falls (which have been numerous over the past 6 months). During said falls, he has had head trauma. Other associated symptoms include confusion, anorexia, weight loss, night sweats and urinary urgency and incontinence. He denies seizures, mood changes, paresthesias, dysuria, and any pain other than HA's.  Aside from TIA and carotid stenosis, he has a PMH of Vit B12 deficiency, MI, AFib, aortic stenosis, anemia, prostate cancer, and DM.   Past Medical History  Diagnosis Date  . CAD (coronary artery disease)     s/p NSTEMI and BMS stent diagonal07/09;  Lexiscan Myoview 9/13:  EF 62%, no ischemia  . AS (aortic stenosis)     a.  Echo 2009 mean gradient 80mm HG. AVA 2.18;  b.  Echo 4/11 mild AS mean gradient 54mm HG;  c.  Echo 04/2011: Mild LVH, EF 0000000, grade 1 diastolic dysfunction, mild aortic stenosis, mean gradient 14, mild LAE;  d. Echo 9/13: mod LVH, EF 50-55%, mild to mod AS, mean gradient 17 mmHg  . AF (paroxysmal atrial fibrillation)     Holter monitor 2.5 sec pauses  . Fatigue     CPX 11/09 VO2  14.4 (75% predicte)d, slope 35.  O2 pulse normal.  VO2 corrected for body weight 17.6.  Marland Kitchen Hypothyroidism   . Hyperlipidemia   . History of prostate cancer    . Allergic rhinitis   . DM (diabetes mellitus)     type II diet controlled  . Shingles   . Left carotid stenosis   . Vitamin B 12 deficiency   . Anemia   . Osteoarthritis   . Nephrolithiasis   . Obesity   . OSA (obstructive sleep apnea)     AHI 15 PSG 09/06/08  . Hx of colonoscopy   . Stroke   . Anxiety and depression   . PMR (polymyalgia rheumatica) 05/29/2012  . Hypogonadism male 05/29/2012  . Hyperparathyroidism 05/29/2012  . TIA (transient ischemic attack) 05/29/2012  . CKD (chronic kidney disease) stage 4, GFR 15-29 ml/min 05/29/2012  . COPD (chronic obstructive pulmonary disease) 05/29/2012  . Myocardial infarction   . Cancer     prostate ca history   Past Surgical History  Procedure Laterality Date  . Appendectomy    . Hernia repair    . Right carpal tunnel release    . Lumbar spine surgery    . Cystectomy      neck  . Prostatectomy    . Vasectomy    . Total knee arthroplasty      right  . Cholecystectomy     Family History  Problem Relation Age of Onset  . Stroke Mother 14  . Coronary artery disease      father, brother, sister  . Alcohol abuse Father 19   History  Substance Use Topics  . Smoking status: Former  Smoker -- 0.50 packs/day for 25 years    Types: Cigarettes    Quit date: 12/25/1958  . Smokeless tobacco: Former Systems developer    Types: Snuff, [name]    Quit date: 08/06/1970     Comment: quit in 1950s  . Alcohol Use: No    Review of Systems All other systems negative except as documented in the HPI. All pertinent positives and negatives as reviewed in the HPI.  Allergies  Bactrim and Horse-derived products  Home Medications   Current Outpatient Rx  Name  Route  Sig  Dispense  Refill  . albuterol (PROVENTIL HFA;VENTOLIN HFA) 108 (90 BASE) MCG/ACT inhaler   Inhalation   Inhale 2 puffs into the lungs every 6 (six) hours as needed for wheezing or shortness of breath.   1 Inhaler   5   . Ascorbic Acid (VITAMIN C) 500 MG tablet   Oral   Take 500 mg by  mouth at bedtime.          . cholecalciferol (VITAMIN D) 1000 UNITS tablet   Oral   Take 1,000 Units by mouth at bedtime.          . clopidogrel (PLAVIX) 75 MG tablet   Oral   Take 75 mg by mouth daily.         . diclofenac sodium (VOLTAREN) 1 % GEL   Topical   Apply 2 g topically 4 (four) times daily.   3 Tube   1   . docusate sodium 100 MG CAPS   Oral   Take 100 mg by mouth 2 (two) times daily.   10 capsule      . famotidine (PEPCID) 20 MG tablet   Oral   Take 1 tablet (20 mg total) by mouth 2 (two) times daily.   180 tablet   3   . folic acid (FOLVITE) 1 MG tablet   Oral   Take 1 mg by mouth daily.         . hyoscyamine (LEVSIN, ANASPAZ) 0.125 MG tablet   Oral   Take 0.125 mg by mouth 4 (four) times daily as needed. Gas pain.         . Iron-Vitamins (GERITOL COMPLETE PO)   Oral   Take 1 tablet by mouth daily.         Marland Kitchen levofloxacin (LEVAQUIN) 250 MG tablet   Oral   Take 1 tablet (250 mg total) by mouth daily.   10 tablet   0   . levothyroxine (SYNTHROID, LEVOTHROID) 137 MCG tablet   Oral   Take 137 mcg by mouth daily.         . meclizine (ANTIVERT) 25 MG tablet   Oral   Take 1 tablet (25 mg total) by mouth 3 (three) times daily as needed for dizziness.   30 tablet   0   . methotrexate (RHEUMATREX) 2.5 MG tablet   Oral   Take 10 mg by mouth once a week. Takes 4 tablets weekly on Wednesday.   Caution:Chemotherapy. Protect from light.         . methylPREDNISolone (MEDROL) 4 MG tablet   Oral   Take 6 mg by mouth daily.          . midodrine (PROAMATINE) 2.5 MG tablet   Oral   Take 2.5 mg by mouth 3 (three) times daily. Take one tablet at 7 am, 2pm and at 4pm         . mirabegron ER (MYRBETRIQ) 25 MG TB24  tablet   Oral   Take 25 mg by mouth at bedtime.         . pravastatin (PRAVACHOL) 40 MG tablet   Oral   Take 40 mg by mouth daily.         . QUEtiapine (SEROQUEL) 50 MG tablet   Oral   Take 1 tablet (50 mg total) by  mouth at bedtime.   60 tablet   11   . risperiDONE (RISPERDAL) 0.25 MG tablet   Oral   Take 0.25-0.5 mg by mouth 2 (two) times daily. 1 tab by mouth in the AM and 2 tab in the PM         . sertraline (ZOLOFT) 100 MG tablet   Oral   Take 100 mg by mouth at bedtime.         . traMADol (ULTRAM) 50 MG tablet   Oral   Take 1 tablet (50 mg total) by mouth every 6 (six) hours as needed.   120 tablet   2    BP 178/87  Pulse 67  Temp(Src) 98.6 F (37 C) (Oral)  Resp 16  SpO2 99% Physical Exam  Nursing note and vitals reviewed. Constitutional: He is oriented to person, place, and time. He appears well-developed and well-nourished. No distress.  HENT:  Head: Normocephalic and atraumatic.  Mouth/Throat: Oropharynx is clear and moist.  Eyes: EOM are normal. Pupils are equal, round, and reactive to light. Right eye exhibits no discharge. Left eye exhibits no discharge.  Ectropion of Left eye present  Neck: Normal range of motion. Neck supple.  Cardiovascular: Normal rate, regular rhythm and intact distal pulses.  Exam reveals no gallop and no friction rub.   Murmur (systolic, 3/6) heard. Pulmonary/Chest: Effort normal and breath sounds normal. No respiratory distress. He has no wheezes.  Breath sounds CTA on supine exam  Abdominal: Soft. Bowel sounds are normal. He exhibits no distension and no mass. There is no tenderness. There is no rebound and no guarding.  Neurological: He is alert and oriented to person, place, and time. He has normal strength. No cranial nerve deficit or sensory deficit. Abnormal coordination: Finger-nose test & Heel-shin test demonstrated slow movements without clumsiness or tremor. Abnormal gait: gait could not be evaluated due to recent instability  GCS eye subscore is 4. GCS verbal subscore is 5. GCS motor subscore is 6.  Reflex Scores:      Brachioradialis reflexes are 1+ on the right side and 1+ on the left side. Skin: Skin is warm and dry. He is not  diaphoretic. No erythema.    ED Course  Procedures (including critical care time) Labs Review Labs Reviewed  CBC - Abnormal; Notable for the following:    WBC 14.0 (*)    RBC 3.89 (*)    MCV 105.9 (*)    MCH 35.2 (*)    All other components within normal limits  BASIC METABOLIC PANEL - Abnormal; Notable for the following:    Sodium 136 (*)    Glucose, Bld 102 (*)    BUN 33 (*)    Creatinine, Ser 1.65 (*)    GFR calc non Af Amer 35 (*)    GFR calc Af Amer 41 (*)    All other components within normal limits  URINALYSIS, ROUTINE W REFLEX MICROSCOPIC  TROPONIN I  POCT I-STAT TROPONIN I   Imaging Review Dg Chest Port 1 View  01/15/2014   CLINICAL DATA:  Weakness, hypertension  EXAM: PORTABLE CHEST - 1  VIEW  COMPARISON:  January 09, 2014  FINDINGS: The heart size and mediastinal contours are stable. The heart size is enlarged. The aorta is tortuous. There is consolidation of lateral left lung base. There is no pulmonary edema or pleural effusion. The visualized skeletal structures are stable.  IMPRESSION: Left lung base pneumonia.   Electronically Signed   By: Abelardo Diesel M.D.   On: 01/15/2014 19:53    Patient be admitted to the hospital for further care and evaluation.  Patient started on IV antibiotics    Brent General, PA-C 01/19/14 1431

## 2014-01-16 DIAGNOSIS — N184 Chronic kidney disease, stage 4 (severe): Secondary | ICD-10-CM

## 2014-01-16 DIAGNOSIS — I359 Nonrheumatic aortic valve disorder, unspecified: Secondary | ICD-10-CM

## 2014-01-16 DIAGNOSIS — I4892 Unspecified atrial flutter: Secondary | ICD-10-CM

## 2014-01-16 DIAGNOSIS — J189 Pneumonia, unspecified organism: Principal | ICD-10-CM

## 2014-01-16 DIAGNOSIS — I10 Essential (primary) hypertension: Secondary | ICD-10-CM

## 2014-01-16 DIAGNOSIS — I4891 Unspecified atrial fibrillation: Secondary | ICD-10-CM

## 2014-01-16 DIAGNOSIS — J449 Chronic obstructive pulmonary disease, unspecified: Secondary | ICD-10-CM

## 2014-01-16 LAB — LEGIONELLA ANTIGEN, URINE: Legionella Antigen, Urine: NEGATIVE

## 2014-01-16 LAB — STREP PNEUMONIAE URINARY ANTIGEN: STREP PNEUMO URINARY ANTIGEN: NEGATIVE

## 2014-01-16 LAB — TSH: TSH: 4.026 u[IU]/mL (ref 0.350–4.500)

## 2014-01-16 LAB — INFLUENZA PANEL BY PCR (TYPE A & B)
H1N1 flu by pcr: NOT DETECTED
Influenza A By PCR: NEGATIVE
Influenza B By PCR: NEGATIVE

## 2014-01-16 LAB — HIV ANTIBODY (ROUTINE TESTING W REFLEX): HIV: NONREACTIVE

## 2014-01-16 MED ORDER — RISPERIDONE 0.25 MG PO TABS
0.2500 mg | ORAL_TABLET | Freq: Every day | ORAL | Status: DC
  Administered 2014-01-16 – 2014-01-21 (×6): 0.25 mg via ORAL
  Filled 2014-01-16 (×6): qty 1

## 2014-01-16 MED ORDER — METHYLPREDNISOLONE 4 MG PO TABS
6.0000 mg | ORAL_TABLET | Freq: Every day | ORAL | Status: DC
  Administered 2014-01-16 – 2014-01-21 (×6): 6 mg via ORAL
  Filled 2014-01-16 (×6): qty 1

## 2014-01-16 MED ORDER — HYDRALAZINE HCL 20 MG/ML IJ SOLN
5.0000 mg | Freq: Four times a day (QID) | INTRAMUSCULAR | Status: DC | PRN
  Administered 2014-01-16: 5 mg via INTRAVENOUS
  Filled 2014-01-16: qty 0.25

## 2014-01-16 MED ORDER — MECLIZINE HCL 25 MG PO TABS
25.0000 mg | ORAL_TABLET | Freq: Three times a day (TID) | ORAL | Status: DC | PRN
  Filled 2014-01-16: qty 1

## 2014-01-16 MED ORDER — AZITHROMYCIN 500 MG PO TABS
500.0000 mg | ORAL_TABLET | ORAL | Status: DC
  Administered 2014-01-16 – 2014-01-21 (×6): 500 mg via ORAL
  Filled 2014-01-16 (×6): qty 1

## 2014-01-16 MED ORDER — RISPERIDONE 0.5 MG PO TABS: 0.5000 mg | ORAL_TABLET | Freq: Every day | ORAL | Status: DC

## 2014-01-16 MED ORDER — ALBUTEROL SULFATE (2.5 MG/3ML) 0.083% IN NEBU: 2.5000 mg | INHALATION_SOLUTION | Freq: Four times a day (QID) | RESPIRATORY_TRACT | Status: DC | PRN

## 2014-01-16 MED ORDER — SIMVASTATIN 20 MG PO TABS
20.0000 mg | ORAL_TABLET | Freq: Every day | ORAL | Status: DC
  Administered 2014-01-16 – 2014-01-21 (×6): 20 mg via ORAL
  Filled 2014-01-16 (×6): qty 1

## 2014-01-16 MED ORDER — LEVOTHYROXINE SODIUM 137 MCG PO TABS
137.0000 ug | ORAL_TABLET | Freq: Every day | ORAL | Status: DC
Start: 2014-01-16 — End: 2014-01-21
  Administered 2014-01-16 – 2014-01-21 (×6): 137 ug via ORAL
  Filled 2014-01-16 (×7): qty 1

## 2014-01-16 MED ORDER — ALBUTEROL SULFATE HFA 108 (90 BASE) MCG/ACT IN AERS: 2.0000 | INHALATION_SPRAY | Freq: Four times a day (QID) | RESPIRATORY_TRACT | Status: DC | PRN

## 2014-01-16 MED ORDER — CLOPIDOGREL BISULFATE 75 MG PO TABS
75.0000 mg | ORAL_TABLET | Freq: Every day | ORAL | Status: DC
  Administered 2014-01-16 – 2014-01-21 (×6): 75 mg via ORAL
  Filled 2014-01-16 (×6): qty 1

## 2014-01-16 MED ORDER — QUETIAPINE FUMARATE 50 MG PO TABS
50.0000 mg | ORAL_TABLET | Freq: Every day | ORAL | Status: DC
  Administered 2014-01-16 – 2014-01-20 (×5): 50 mg via ORAL
  Filled 2014-01-16 (×6): qty 1

## 2014-01-16 MED ORDER — MORPHINE SULFATE 2 MG/ML IJ SOLN
2.0000 mg | INTRAMUSCULAR | Status: DC | PRN
  Administered 2014-01-16 – 2014-01-18 (×2): 2 mg via INTRAVENOUS
  Filled 2014-01-16 (×2): qty 1

## 2014-01-16 MED ORDER — HEPARIN SODIUM (PORCINE) 5000 UNIT/ML IJ SOLN
5000.0000 [IU] | Freq: Three times a day (TID) | INTRAMUSCULAR | Status: DC
  Administered 2014-01-16 – 2014-01-21 (×18): 5000 [IU] via SUBCUTANEOUS
  Filled 2014-01-16 (×20): qty 1

## 2014-01-16 MED ORDER — RISPERIDONE 0.5 MG PO TABS
0.5000 mg | ORAL_TABLET | Freq: Every day | ORAL | Status: DC
  Administered 2014-01-16 – 2014-01-20 (×6): 0.5 mg via ORAL
  Filled 2014-01-16 (×9): qty 1

## 2014-01-16 MED ORDER — DEXTROSE 5 % IV SOLN
1.0000 g | INTRAVENOUS | Status: DC
  Administered 2014-01-16 – 2014-01-20 (×5): 1 g via INTRAVENOUS
  Filled 2014-01-16 (×6): qty 10

## 2014-01-16 MED ORDER — SERTRALINE HCL 100 MG PO TABS
100.0000 mg | ORAL_TABLET | Freq: Every day | ORAL | Status: DC
  Administered 2014-01-16 – 2014-01-20 (×6): 100 mg via ORAL
  Filled 2014-01-16 (×7): qty 1

## 2014-01-16 NOTE — H&P (Signed)
History and Physical    [name]:[id number] DOB: Oct 20, 1923 DOA: 01/15/2014  Referring physician: Dr. Alvino Chapel PCP: Cathlean Cower, MD  Specialists: none  Chief Complaint: Hallucinations, weakness  HPI: [name] is a 80 y.o. male with complex medical history as below, including coronary artery disease, paroxysmal A. fib, diabetes, intermittent confusion with hallucinations, CKD, presents with a chief complaint of worsening hallucinations, worsening weakness at home at 3 falls in the last couple of days. He denies any fever or chills, denies any shortness of breath or cough, denies any chest pain, denies any abdominal pain nausea vomiting or diarrhea. He also has a history of orthostatic hypotension for which he was on Florinef in the past, now he is taking Midodrine for that. Family states that his hallucinations have been getting worse in the last couple of weeks. He does have insight into some hallucinations at times, but tries to interact with others. In the emergency room, blood work pertinent for leukocytosis of 14 and a chest x-ray suggestive of pneumonia.  Review of Systems: As per history of present illness otherwise negative   Past Medical History  Diagnosis Date  . CAD (coronary artery disease)     s/p NSTEMI and BMS stent diagonal07/09;  Lexiscan Myoview 9/13:  EF 62%, no ischemia  . AS (aortic stenosis)     a.  Echo 2009 mean gradient 83mm HG. AVA 2.18;  b.  Echo 4/11 mild AS mean gradient 17mm HG;  c.  Echo 04/2011: Mild LVH, EF 99-37%, grade 1 diastolic dysfunction, mild aortic stenosis, mean gradient 14, mild LAE;  d. Echo 9/13: mod LVH, EF 50-55%, mild to mod AS, mean gradient 17 mmHg  . AF (paroxysmal atrial fibrillation)     Holter monitor 2.5 sec pauses  . Fatigue     CPX 11/09 VO2  14.4 (75% predicte)d, slope 35.  O2 pulse normal.  VO2 corrected for body weight 17.6.  Marland Kitchen Hypothyroidism   . Hyperlipidemia   . History of prostate cancer   . Allergic rhinitis    . DM (diabetes mellitus)     type II diet controlled  . Shingles   . Left carotid stenosis   . Vitamin B 12 deficiency   . Anemia   . Osteoarthritis   . Nephrolithiasis   . Obesity   . OSA (obstructive sleep apnea)     AHI 15 PSG 09/06/08  . Hx of colonoscopy   . Stroke   . Anxiety and depression   . PMR (polymyalgia rheumatica) 05/29/2012  . Hypogonadism male 05/29/2012  . Hyperparathyroidism 05/29/2012  . TIA (transient ischemic attack) 05/29/2012  . CKD (chronic kidney disease) stage 4, GFR 15-29 ml/min 05/29/2012  . COPD (chronic obstructive pulmonary disease) 05/29/2012  . Myocardial infarction   . Cancer     prostate ca history   Past Surgical History  Procedure Laterality Date  . Appendectomy    . Hernia repair    . Right carpal tunnel release    . Lumbar spine surgery    . Cystectomy      neck  . Prostatectomy    . Vasectomy    . Total knee arthroplasty      right  . Cholecystectomy     Social History:  reports that he quit smoking about 55 years ago. His smoking use included Cigarettes. He has a 12.5 pack-year smoking history. He quit smokeless tobacco use about 43 years ago. His smokeless tobacco use included Snuff and Chew.  He reports that he does not drink alcohol or use illicit drugs.  Allergies  Allergen Reactions  . Bactrim [Sulfamethoxazole-Trimethoprim] Other (See Comments)    hallucinations  . Horse-Derived Products Hives    Family History  Problem Relation Age of Onset  . Stroke Mother 15  . Coronary artery disease      father, brother, sister  . Alcohol abuse Father 56   Prior to Admission medications   Medication Sig Start Date End Date Taking? Authorizing Provider  albuterol (PROVENTIL HFA;VENTOLIN HFA) 108 (90 BASE) MCG/ACT inhaler Inhale 2 puffs into the lungs every 6 (six) hours as needed for wheezing or shortness of breath. 11/29/12  Yes Biagio Borg, MD  Ascorbic Acid (VITAMIN C) 500 MG tablet Take 500 mg by mouth at bedtime.    Yes Historical  Provider, MD  cholecalciferol (VITAMIN D) 1000 UNITS tablet Take 1,000 Units by mouth at bedtime.    Yes Historical Provider, MD  clopidogrel (PLAVIX) 75 MG tablet Take 75 mg by mouth daily.   Yes Historical Provider, MD  diclofenac sodium (VOLTAREN) 1 % GEL Apply 2 g topically 4 (four) times daily. 04/30/13  Yes Ivan Anchors Love, PA-C  docusate sodium 100 MG CAPS Take 100 mg by mouth 2 (two) times daily. 12/30/12  Yes Thurnell Lose, MD  famotidine (PEPCID) 20 MG tablet Take 1 tablet (20 mg total) by mouth 2 (two) times daily. 07/10/13  Yes Biagio Borg, MD  folic acid (FOLVITE) 1 MG tablet Take 1 mg by mouth daily.   Yes Historical Provider, MD  hyoscyamine (LEVSIN, ANASPAZ) 0.125 MG tablet Take 0.125 mg by mouth 4 (four) times daily as needed. Gas pain.   Yes Historical Provider, MD  Iron-Vitamins (GERITOL COMPLETE PO) Take 1 tablet by mouth daily.   Yes Historical Provider, MD  levofloxacin (LEVAQUIN) 250 MG tablet Take 1 tablet (250 mg total) by mouth daily. 01/09/14  Yes Biagio Borg, MD  levothyroxine (SYNTHROID, LEVOTHROID) 137 MCG tablet Take 137 mcg by mouth daily.   Yes Historical Provider, MD  meclizine (ANTIVERT) 25 MG tablet Take 1 tablet (25 mg total) by mouth 3 (three) times daily as needed for dizziness. 08/08/13  Yes Barton Dubois, MD  methotrexate (RHEUMATREX) 2.5 MG tablet Take 10 mg by mouth once a week. Takes 4 tablets weekly on Wednesday.   Caution:Chemotherapy. Protect from light.   Yes Historical Provider, MD  methylPREDNISolone (MEDROL) 4 MG tablet Take 6 mg by mouth daily.    Yes Historical Provider, MD  midodrine (PROAMATINE) 2.5 MG tablet Take 2.5 mg by mouth 3 (three) times daily. Take one tablet at 7 am, 2pm and at 4pm 09/23/13  Yes Minus Breeding, MD  mirabegron ER (MYRBETRIQ) 25 MG TB24 tablet Take 25 mg by mouth at bedtime.   Yes Historical Provider, MD  pravastatin (PRAVACHOL) 40 MG tablet Take 40 mg by mouth daily.   Yes Historical Provider, MD  QUEtiapine (SEROQUEL) 50 MG  tablet Take 1 tablet (50 mg total) by mouth at bedtime. 01/09/14  Yes Biagio Borg, MD  risperiDONE (RISPERDAL) 0.25 MG tablet Take 0.25-0.5 mg by mouth 2 (two) times daily. 1 tab by mouth in the AM and 2 tab in the PM 11/05/13  Yes Biagio Borg, MD  sertraline (ZOLOFT) 100 MG tablet Take 100 mg by mouth at bedtime.   Yes Historical Provider, MD  traMADol (ULTRAM) 50 MG tablet Take 1 tablet (50 mg total) by mouth every 6 (six) hours as  needed. 11/25/13  Yes Biagio Borg, MD   Physical Exam: Filed Vitals:   01/15/14 1917 01/15/14 2217  BP: 178/87 191/87  Pulse: 67 57  Temp: 98.6 F (37 C)   TempSrc: Oral   Resp: 16 18  SpO2: 99% 94%     General:  No apparent distress, alert and oriented x4   Eyes: PERRL, EOMI, no scleral icterus  ENT: moist oropharynx  Neck: supple, no JVD  Cardiovascular: regular rate without MRG; 2+ peripheral pulses  Respiratory: CTA biL, good air movement without wheezing, rhonchi or crackled  Abdomen: soft, non tender to palpation, positive bowel sounds, no guarding, no rebound  Skin: no rashes  Musculoskeletal: no peripheral edema  Psychiatric: normal mood and affect  Neurologic: nonfocal   Labs on Admission:  Basic Metabolic Panel:  Recent Labs Lab 01/09/14 1544 01/15/14 1940  NA 138 136*  K 4.6 4.7  CL 103 101  CO2 29 24  GLUCOSE 120* 102*  BUN 23 33*  CREATININE 1.6* 1.65*  CALCIUM 8.7 8.8   Liver Function Tests:  Recent Labs Lab 01/09/14 1544  AST 22  ALT 22  ALKPHOS 69  BILITOT 0.8  PROT 6.2  ALBUMIN 3.7   CBC:  Recent Labs Lab 01/09/14 1544 01/15/14 1940  WBC 9.1 14.0*  NEUTROABS 7.1  --   HGB 13.6 13.7  HCT 40.3 41.2  MCV 103.7* 105.9*  PLT 312.0 307   Cardiac Enzymes:  Recent Labs Lab 01/15/14 1940  TROPONINI <0.30    BNP (last 3 results)  Recent Labs  04/16/13 2327  PROBNP 1599.0*   Radiological Exams on Admission: Ct Head Wo Contrast  01/15/2014   CLINICAL DATA:  Hallucinations and  weakness.  EXAM: CT HEAD WITHOUT CONTRAST  TECHNIQUE: Contiguous axial images were obtained from the base of the skull through the vertex without intravenous contrast.  COMPARISON:  CT of the head performed 11/03/2013  FINDINGS: There is no evidence of acute infarction, mass lesion, or intra- or extra-axial hemorrhage on CT.  Prominence of the ventricles and sulci reflects moderate cortical volume loss. Mild cerebellar atrophy is noted. Scattered periventricular white matter change likely reflects small vessel ischemic microangiopathy. A small chronic lacunar infarct is seen at the right globus pallidus.  The brainstem and fourth ventricle are within normal limits. The cerebral hemispheres demonstrate grossly normal gray-white differentiation. No mass effect or midline shift is seen.  There is no evidence of fracture; visualized osseous structures are unremarkable in appearance. The orbits are within normal limits. The paranasal sinuses and mastoid air cells are well-aerated. No significant soft tissue abnormalities are seen.  IMPRESSION: 1. No acute intracranial pathology seen on CT. 2. Moderate cortical volume loss and scattered small vessel ischemic microangiopathy. Small chronic lacunar infarct at the right globus pallidus.   Electronically Signed   By: Garald Balding M.D.   On: 01/15/2014 21:41   Dg Chest Port 1 View  01/15/2014   CLINICAL DATA:  Weakness, hypertension  EXAM: PORTABLE CHEST - 1 VIEW  COMPARISON:  January 09, 2014  FINDINGS: The heart size and mediastinal contours are stable. The heart size is enlarged. The aorta is tortuous. There is consolidation of lateral left lung base. There is no pulmonary edema or pleural effusion. The visualized skeletal structures are stable.  IMPRESSION: Left lung base pneumonia.   Electronically Signed   By: Abelardo Diesel M.D.   On: 01/15/2014 19:53    EKG: Independently reviewed.  Assessment/Plan Principal Problem:   CAP (  community acquired  pneumonia) Active Problems:   HYPOTHYROIDISM   HYPERLIPIDEMIA   ANEMIA   DEPRESSIVE DISORDER NOT ELSEWHERE CLASSIFIED   CORONARY ARTERY DISEASE   RENAL FAILURE   DERMATITIS, FACE   DM (diabetes mellitus)   Obesity   CKD (chronic kidney disease) stage 4, GFR 15-29 ml/min   COPD (chronic obstructive pulmonary disease)   Urinary incontinence   Orthostatic hypotension   Hallucinations   Dementia   CAP - will start ceftriaxone and azithromycin. He has been on levofloxacin since he last saw his PCP 2 days ago. Currently his symptoms have improved. Flu Rule out Hallucinations - may be exacerbated by #1 versus progressive decline  Hypotension - he has been having a known history of orthostatic hypotension has been on Florinef in the past. He is on Midodrine, which I will discontinue due to systolic blood pressure in the 190s.  Hypertension - hydralazine as needed for systolic blood pressure greater than 180. Discontinue Midodrine.  Coronary artery disease - continue home medications  Chronic kidney disease - baseline creatinine 1.3-1.5. He is at 1.6 now  Leukocytosis - may be due to #1 versus steroids, he is currently on Medrol  COPD - continue inhalers  Hyperlipidemia - continue statin  Hypothyroidism - continue Synthroid  Weakness - PT consult  Diet: Heart Fluids: none DVT Prophylaxis: heparin  Code Status: DNR  Family Communication: family bedside  Disposition Plan: inpatient  Time spent: 80  Costin M. Cruzita Lederer, MD [facility] Pager [phone]  If 7PM-7AM, please contact night-coverage [email or web address] Password [facility] 01/16/2014, 12:16 AM

## 2014-01-16 NOTE — Evaluation (Signed)
Physical Therapy Evaluation Patient Details Name: [name] MRN: [id number] DOB: 07-27-1923 Today's Date: 01/16/2014 Time: 0913-0929 PT Time Calculation (min): 16 min  PT Assessment / Plan / Recommendation History of Present Illness  80 y.o. male with complex medical history as below, including coronary artery disease, paroxysmal A. fib, diabetes, intermittent confusion with hallucinations, CKD, presents with a chief complaint of worsening hallucinations, worsening weakness at home at 3 falls in the last couple of days. He denies any fever or chills, denies any shortness of breath or cough, denies any chest pain, denies any abdominal pain nausea vomiting or diarrhea. He also has a history of orthostatic hypotension for which he was on Florinef in the past, now he is taking Midodrine for that. Family states that his hallucinations have been getting worse in the last couple of weeks. He does have insight into some hallucinations at times, but tries to interact with others. In the emergency room, blood work pertinent for leukocytosis of 14 and a chest x-ray suggestive of pneumonia.  Clinical Impression  Pt admitted with CAP, hallucinations, and weakness. Pt currently with functional limitations due to the deficits listed below (see PT Problem List).  Pt will benefit from skilled PT to increase their independence and safety with mobility to allow discharge to the venue listed below.  Pt reports spouse has Alzheimers, and they both have one 24/7 caregiver.  Recommend ST-SNF as pt currently requiring assist for mobility and presents with generalized weakness.     PT Assessment  Patient needs continued PT services    Follow Up Recommendations  SNF    Does the patient have the potential to tolerate intense rehabilitation      Barriers to Discharge        Equipment Recommendations  None recommended by PT    Recommendations for Other Services     Frequency Min 3X/week    Precautions /  Restrictions Precautions Precautions: Fall Restrictions Weight Bearing Restrictions: No   Pertinent Vitals/Pain "feels bad" no specific c/o pain       Mobility  Bed Mobility Overal bed mobility: Needs Assistance Bed Mobility: Supine to Sit Supine to sit: Mod assist General bed mobility comments: verbal cues for technique, assist for L LE over EOB and trunk upright Transfers Overall transfer level: Needs assistance Equipment used: Rolling walker (2 wheeled) Transfers: Sit to/from Stand Sit to Stand: Min assist General transfer comment: verbal cues for safe technique, assist to rise and control descent Ambulation/Gait Ambulation/Gait assistance: Min assist Ambulation Distance (Feet): 35 Feet Assistive device: Rolling walker (2 wheeled) Gait Pattern/deviations: Step-through pattern;Trunk flexed Gait velocity: decr General Gait Details: verbal cues for posture and RW distance, pt reported dizziness with mobility which did not get worse nor resolve, pt reports feeling very weak limiting distance, SpO2 99% room air upon return to recliner    Exercises     PT Diagnosis: Difficulty walking;Generalized weakness  PT Problem List: Decreased strength;Decreased activity tolerance;Decreased mobility;Decreased knowledge of use of DME PT Treatment Interventions: DME instruction;Gait training;Functional mobility training;Therapeutic activities;Therapeutic exercise;Patient/family education     PT Goals(Current goals can be found in the care plan section) Acute Rehab PT Goals PT Goal Formulation: With patient Time For Goal Achievement: 01/30/14 Potential to Achieve Goals: Good  Visit Information  Last PT Received On: 01/16/14 Assistance Needed: +1 History of Present Illness: 80 y.o. male with complex medical history as below, including coronary artery disease, paroxysmal A. fib, diabetes, intermittent confusion with hallucinations, CKD, presents with a chief complaint  of worsening  hallucinations, worsening weakness at home at 3 falls in the last couple of days. He denies any fever or chills, denies any shortness of breath or cough, denies any chest pain, denies any abdominal pain nausea vomiting or diarrhea. He also has a history of orthostatic hypotension for which he was on Florinef in the past, now he is taking Midodrine for that. Family states that his hallucinations have been getting worse in the last couple of weeks. He does have insight into some hallucinations at times, but tries to interact with others. In the emergency room, blood work pertinent for leukocytosis of 14 and a chest x-ray suggestive of pneumonia.       Prior [place] expects to be discharged to:: Private residence Living Arrangements: Spouse/significant other;Other (Comment) Available Help at Discharge: Personal care attendant;Available 24 hours/day Type of Home: House Home Access: Ramped entrance Home Layout: One level Home Equipment: Walker - 2 wheels Additional Comments: Wife has Alzheimer's.   Prior Function Level of Independence: Independent with assistive device(s) Communication Communication: HOH    Cognition  Cognition Arousal/Alertness: Awake/alert Behavior During Therapy: WFL for tasks assessed/performed Overall Cognitive Status: Within Functional Limits for tasks assessed    Extremity/Trunk Assessment Lower Extremity Assessment Lower Extremity Assessment: Generalized weakness   Balance    End of Session PT - End of Session Equipment Utilized During Treatment: Gait belt Activity Tolerance: Patient limited by fatigue Patient left: in chair;with call bell/phone within reach Nurse Communication: Mobility status  GP     [name],[name] 01/16/2014, 10:04 AM [name], PT, DPT 01/16/2014 Pager: [phone]

## 2014-01-16 NOTE — Progress Notes (Signed)
[facility] PROGRESS NOTE  [name] YSA:[id number] DOB: Aug 20, 1923 DOA: 01/15/2014 PCP: Don Cower, MD  Assessment/Plan: CAP - will start ceftriaxone and azithromycin. He has been on levofloxacin since he last saw his PCP 2 days ago. Currently his symptoms have improved. Flu Rule out  Hallucinations - may be exacerbated by #1 versus progressive decline  Hypotension - he has been having a known history of orthostatic hypotension has been on Florinef in the past. He is on Midodrine, which I will discontinue due to systolic blood pressure in the 190s.  Hypertension - hydralazine as needed for systolic blood pressure greater than 180. Discontinue Midodrine.  Coronary artery disease - continue home medications  Chronic kidney disease - baseline creatinine 1.3-1.5. He is at 1.6 now  Leukocytosis - may be due to #1 versus steroids, he is currently on Medrol  COPD - continue inhalers  Hyperlipidemia - continue statin  Hypothyroidism - continue Synthroid  Weakness - PT consult  Code Status: DNR Family Communication: Pt and family in room (indicate person spoken with, relationship, and if by phone, the number) Disposition Plan: Pending SNF  Antibiotics:  Azithromycin 01/15/14>>>  Ceftriaxone 01/15/14>>>  HPI/Subjective: Feels better. No complaints. Eager to go home  Objective: Filed Vitals:   01/16/14 0121 01/16/14 0222 01/16/14 0551 01/16/14 1356  BP: 194/80 175/74 156/75 101/55  Pulse: 59 58 64 83  Temp: 98 F (36.7 C)  97.5 F (36.4 C) 97.9 F (36.6 C)  TempSrc: Oral  Oral Oral  Resp: 16  16 18   Height: 5\' 8"  (1.727 m)     Weight: 90.266 kg (199 lb)     SpO2: 98%  94% 95%    Intake/Output Summary (Last 24 hours) at 01/16/14 1520 Last data filed at 01/16/14 1300  Gross per 24 hour  Intake    600 ml  Output   1400 ml  Net   -800 ml   Filed Weights   01/16/14 0121  Weight: 90.266 kg (199 lb)    Exam:   General:  Awake, in nad  Cardiovascular: regular,  s1, s2  Respiratory: normal resp effort, no wheezing  Abdomen: soft, nondistended  Musculoskeletal: perfused, no clubbing   Data Reviewed: Basic Metabolic Panel:  Recent Labs Lab 01/09/14 1544 01/15/14 1940  NA 138 136*  K 4.6 4.7  CL 103 101  CO2 29 24  GLUCOSE 120* 102*  BUN 23 33*  CREATININE 1.6* 1.65*  CALCIUM 8.7 8.8   Liver Function Tests:  Recent Labs Lab 01/09/14 1544  AST 22  ALT 22  ALKPHOS 69  BILITOT 0.8  PROT 6.2  ALBUMIN 3.7   No results found for this basename: LIPASE, AMYLASE,  in the last 168 hours No results found for this basename: AMMONIA,  in the last 168 hours CBC:  Recent Labs Lab 01/09/14 1544 01/15/14 1940  WBC 9.1 14.0*  NEUTROABS 7.1  --   HGB 13.6 13.7  HCT 40.3 41.2  MCV 103.7* 105.9*  PLT 312.0 307   Cardiac Enzymes:  Recent Labs Lab 01/15/14 1940  TROPONINI <0.30   BNP (last 3 results)  Recent Labs  04/16/13 2327  PROBNP 1599.0*   CBG: No results found for this basename: GLUCAP,  in the last 168 hours  No results found for this or any previous visit (from the past 240 hour(s)).   Studies: Ct Head Wo Contrast  01/15/2014   CLINICAL DATA:  Hallucinations and weakness.  EXAM: CT HEAD WITHOUT CONTRAST  TECHNIQUE: Contiguous  axial images were obtained from the base of the skull through the vertex without intravenous contrast.  COMPARISON:  CT of the head performed 11/03/2013  FINDINGS: There is no evidence of acute infarction, mass lesion, or intra- or extra-axial hemorrhage on CT.  Prominence of the ventricles and sulci reflects moderate cortical volume loss. Mild cerebellar atrophy is noted. Scattered periventricular white matter change likely reflects small vessel ischemic microangiopathy. A small chronic lacunar infarct is seen at the right globus pallidus.  The brainstem and fourth ventricle are within normal limits. The cerebral hemispheres demonstrate grossly normal gray-white differentiation. No mass effect or  midline shift is seen.  There is no evidence of fracture; visualized osseous structures are unremarkable in appearance. The orbits are within normal limits. The paranasal sinuses and mastoid air cells are well-aerated. No significant soft tissue abnormalities are seen.  IMPRESSION: 1. No acute intracranial pathology seen on CT. 2. Moderate cortical volume loss and scattered small vessel ischemic microangiopathy. Small chronic lacunar infarct at the right globus pallidus.   Electronically Signed   By: Garald Balding M.D.   On: 01/15/2014 21:41   Dg Chest Port 1 View  01/15/2014   CLINICAL DATA:  Weakness, hypertension  EXAM: PORTABLE CHEST - 1 VIEW  COMPARISON:  January 09, 2014  FINDINGS: The heart size and mediastinal contours are stable. The heart size is enlarged. The aorta is tortuous. There is consolidation of lateral left lung base. There is no pulmonary edema or pleural effusion. The visualized skeletal structures are stable.  IMPRESSION: Left lung base pneumonia.   Electronically Signed   By: Abelardo Diesel M.D.   On: 01/15/2014 19:53    Scheduled Meds: . azithromycin  500 mg Oral Q24H  . cefTRIAXone (ROCEPHIN)  IV  1 g Intravenous Q24H  . clopidogrel  75 mg Oral Daily  . heparin  5,000 Units Subcutaneous Q8H  . levothyroxine  137 mcg Oral QAC breakfast  . methylPREDNISolone  6 mg Oral Daily  . QUEtiapine  50 mg Oral QHS  . risperiDONE  0.25 mg Oral Daily  . risperiDONE  0.5 mg Oral QHS  . sertraline  100 mg Oral QHS  . simvastatin  20 mg Oral q1800   Continuous Infusions:   Principal Problem:   CAP (community acquired pneumonia) Active Problems:   HYPOTHYROIDISM   HYPERLIPIDEMIA   ANEMIA   DEPRESSIVE DISORDER NOT ELSEWHERE CLASSIFIED   CORONARY ARTERY DISEASE   RENAL FAILURE   DERMATITIS, FACE   DM (diabetes mellitus)   Obesity   CKD (chronic kidney disease) stage 4, GFR 15-29 ml/min   COPD (chronic obstructive pulmonary disease)   Urinary incontinence   Orthostatic  hypotension   Hallucinations   Dementia    Time spent: 56min    [name], [place] [facility] Pager [phone]. If 7PM-7AM, please contact night-coverage at [email or web address], password [facility] 01/16/2014, 3:20 PM  LOS: 1 day

## 2014-01-16 NOTE — Care Management Note (Addendum)
    Page 1 of 1   01/19/2014     2:48:53 PM   CARE MANAGEMENT NOTE 01/19/2014  Patient:  [name], [name]   Account Number:  [id number]  Date Initiated:  01/16/2014  Documentation initiated by:  [facility]  Subjective/Objective Assessment:   80 Y/O M ADMITTED W/PNA.HX:CAD,HALLUCINATIONS.     Action/Plan:   FROM HOME W/SPOUSE-ALZHEIMERS.HAS PCP, PHARMACY.   Anticipated DC Date:  01/21/2014   Anticipated DC Plan:  [place]  CM consult      Choice offered to / List presented to:             Status of service:  In process, will continue to follow Medicare Important Message given?   (If response is "NO", the following Medicare IM given date fields will be blank) Date Medicare IM given:   Date Additional Medicare IM given:    Discharge Disposition:    Per UR Regulation:  Reviewed for med. necessity/level of care/duration of stay  If discussed at [place] of Stay Meetings, dates discussed:    Comments:  01/19/14 Jazzlyn Huizenga RN,BSN NCM [phone] PNA-IV ABX,IVF@100 .PT-SNF.  01/16/14 Lacinda Curvin RN,BSN NCM [phone] PT-SNF.D/C PLAN SNF.

## 2014-01-17 DIAGNOSIS — D649 Anemia, unspecified: Secondary | ICD-10-CM

## 2014-01-17 DIAGNOSIS — F411 Generalized anxiety disorder: Secondary | ICD-10-CM

## 2014-01-17 LAB — CBC
HEMATOCRIT: 38.7 % — AB (ref 39.0–52.0)
HEMOGLOBIN: 13.1 g/dL (ref 13.0–17.0)
MCH: 35.5 pg — AB (ref 26.0–34.0)
MCHC: 33.9 g/dL (ref 30.0–36.0)
MCV: 104.9 fL — AB (ref 78.0–100.0)
Platelets: 251 10*3/uL (ref 150–400)
RBC: 3.69 MIL/uL — AB (ref 4.22–5.81)
RDW: 14.5 % (ref 11.5–15.5)
WBC: 9.9 10*3/uL (ref 4.0–10.5)

## 2014-01-17 LAB — BASIC METABOLIC PANEL
BUN: 28 mg/dL — ABNORMAL HIGH (ref 6–23)
CO2: 26 meq/L (ref 19–32)
Calcium: 8.2 mg/dL — ABNORMAL LOW (ref 8.4–10.5)
Chloride: 104 mEq/L (ref 96–112)
Creatinine, Ser: 1.41 mg/dL — ABNORMAL HIGH (ref 0.50–1.35)
GFR calc Af Amer: 49 mL/min — ABNORMAL LOW (ref >90)
GFR calc non Af Amer: 43 mL/min — ABNORMAL LOW (ref >90)
GLUCOSE: 104 mg/dL — AB (ref 70–99)
Potassium: 4.3 mEq/L (ref 3.7–5.3)
Sodium: 140 mEq/L (ref 137–147)

## 2014-01-17 MED ORDER — CEFPODOXIME PROXETIL 200 MG PO TABS: 200.0000 mg | ORAL_TABLET | Freq: Two times a day (BID) | ORAL | Status: DC

## 2014-01-17 MED ORDER — AZITHROMYCIN 500 MG PO TABS: 500.0000 mg | ORAL_TABLET | ORAL | Status: DC

## 2014-01-17 MED ORDER — MIDODRINE HCL 2.5 MG PO TABS
2.5000 mg | ORAL_TABLET | Freq: Two times a day (BID) | ORAL | Status: DC
  Administered 2014-01-17 – 2014-01-21 (×10): 2.5 mg via ORAL
  Filled 2014-01-17 (×11): qty 1

## 2014-01-17 MED ORDER — SODIUM CHLORIDE 0.9 % IV SOLN
INTRAVENOUS | Status: DC
  Administered 2014-01-17 – 2014-01-19 (×6): via INTRAVENOUS
  Administered 2014-01-19: 100 mL/h via INTRAVENOUS
  Administered 2014-01-20 – 2014-01-21 (×2): via INTRAVENOUS

## 2014-01-17 NOTE — Progress Notes (Signed)
Orthostatic vital signs: Lying 135/73, HR 73 Sitting 99/67, HR 77 Standing 106/85, HR 79 (unable to stand for entire BP reading, sat down halfway through)  Coolidge Breeze, RN 01/17/2014

## 2014-01-17 NOTE — Progress Notes (Signed)
[facility] PROGRESS NOTE  [name]:[id number] DOB: Aug 06, 1923 DOA: 01/15/2014 PCP: Cathlean Cower, MD  Assessment/Plan: CAP - On ceftriaxone and azithromycin. Flu neg. Pt with leukocytosis resolved and no fevers Hallucinations - may be exacerbated by #1 versus progressive decline  Hypotension - he has been having a known history of orthostatic hypotension has been on Florinef in the past. Pt was on Midodrine, which was discontinued due to systolic blood pressure in the 190s. Will resume - see below Syncope - pt with syncope this AM upon standing. Will check orthostatic vitals. Will resume midodrine as tolerated given history of orthostatic hypotension. Will also start on basal IVF Hypertension - hydralazine as needed for systolic blood pressure greater than 180.  Coronary artery disease - continue home medications  Chronic kidney disease - baseline creatinine 1.3-1.5. He is at 1.4 now  Leukocytosis - likely secondary to pneumonia - resolved with abx COPD - continue inhalers  Hyperlipidemia - continue statin  Hypothyroidism - continue Synthroid  Weakness - PT consult  Code Status: DNR Family Communication: Pt in room (indicate person spoken with, relationship, and if by phone, the number) Disposition Plan: Pending  Antibiotics:  Azithromycin 01/15/14>>>  Ceftriaxone 01/15/14>>>  HPI/Subjective: Feels better. No complaints. Eager to go home  Objective: Filed Vitals:   01/16/14 0551 01/16/14 1356 01/16/14 2333 01/17/14 0611  BP: 156/75 101/55 154/82 146/82  Pulse: 64 83 66 61  Temp: 97.5 F (36.4 C) 97.9 F (36.6 C) 97.5 F (36.4 C) 97.8 F (36.6 C)  TempSrc: Oral Oral Oral Oral  Resp: 16 18 20 16   Height:      Weight:      SpO2: 94% 95% 95% 96%    Intake/Output Summary (Last 24 hours) at 01/17/14 1104 Last data filed at 01/17/14 1000  Gross per 24 hour  Intake    650 ml  Output   1990 ml  Net  -1340 ml   Filed Weights   01/16/14 0121  Weight: 90.266  kg (199 lb)    Exam:   General:  Awake, in nad  Cardiovascular: regular, s1, s2  Respiratory: normal resp effort, no wheezing  Abdomen: soft, nondistended  Musculoskeletal: perfused, no clubbing   Data Reviewed: Basic Metabolic Panel:  Recent Labs Lab 01/15/14 1940 01/17/14 0800  NA 136* 140  K 4.7 4.3  CL 101 104  CO2 24 26  GLUCOSE 102* 104*  BUN 33* 28*  CREATININE 1.65* 1.41*  CALCIUM 8.8 8.2*   Liver Function Tests: No results found for this basename: AST, ALT, ALKPHOS, BILITOT, PROT, ALBUMIN,  in the last 168 hours No results found for this basename: LIPASE, AMYLASE,  in the last 168 hours No results found for this basename: AMMONIA,  in the last 168 hours CBC:  Recent Labs Lab 01/15/14 1940 01/17/14 0800  WBC 14.0* 9.9  HGB 13.7 13.1  HCT 41.2 38.7*  MCV 105.9* 104.9*  PLT 307 251   Cardiac Enzymes:  Recent Labs Lab 01/15/14 1940  TROPONINI <0.30   BNP (last 3 results)  Recent Labs  04/16/13 2327  PROBNP 1599.0*   CBG: No results found for this basename: GLUCAP,  in the last 168 hours  No results found for this or any previous visit (from the past 240 hour(s)).   Studies: Ct Head Wo Contrast  01/15/2014   CLINICAL DATA:  Hallucinations and weakness.  EXAM: CT HEAD WITHOUT CONTRAST  TECHNIQUE: Contiguous axial images were obtained from the base of the skull through  the vertex without intravenous contrast.  COMPARISON:  CT of the head performed 11/03/2013  FINDINGS: There is no evidence of acute infarction, mass lesion, or intra- or extra-axial hemorrhage on CT.  Prominence of the ventricles and sulci reflects moderate cortical volume loss. Mild cerebellar atrophy is noted. Scattered periventricular white matter change likely reflects small vessel ischemic microangiopathy. A small chronic lacunar infarct is seen at the right globus pallidus.  The brainstem and fourth ventricle are within normal limits. The cerebral hemispheres demonstrate  grossly normal gray-white differentiation. No mass effect or midline shift is seen.  There is no evidence of fracture; visualized osseous structures are unremarkable in appearance. The orbits are within normal limits. The paranasal sinuses and mastoid air cells are well-aerated. No significant soft tissue abnormalities are seen.  IMPRESSION: 1. No acute intracranial pathology seen on CT. 2. Moderate cortical volume loss and scattered small vessel ischemic microangiopathy. Small chronic lacunar infarct at the right globus pallidus.   Electronically Signed   By: Garald Balding M.D.   On: 01/15/2014 21:41   Dg Chest Port 1 View  01/15/2014   CLINICAL DATA:  Weakness, hypertension  EXAM: PORTABLE CHEST - 1 VIEW  COMPARISON:  January 09, 2014  FINDINGS: The heart size and mediastinal contours are stable. The heart size is enlarged. The aorta is tortuous. There is consolidation of lateral left lung base. There is no pulmonary edema or pleural effusion. The visualized skeletal structures are stable.  IMPRESSION: Left lung base pneumonia.   Electronically Signed   By: Abelardo Diesel M.D.   On: 01/15/2014 19:53    Scheduled Meds: . azithromycin  500 mg Oral Q24H  . cefTRIAXone (ROCEPHIN)  IV  1 g Intravenous Q24H  . clopidogrel  75 mg Oral Daily  . heparin  5,000 Units Subcutaneous Q8H  . levothyroxine  137 mcg Oral QAC breakfast  . methylPREDNISolone  6 mg Oral Daily  . QUEtiapine  50 mg Oral QHS  . risperiDONE  0.25 mg Oral Daily  . risperiDONE  0.5 mg Oral QHS  . sertraline  100 mg Oral QHS  . simvastatin  20 mg Oral q1800   Continuous Infusions: . sodium chloride      Principal Problem:   CAP (community acquired pneumonia) Active Problems:   HYPOTHYROIDISM   HYPERLIPIDEMIA   ANEMIA   DEPRESSIVE DISORDER NOT ELSEWHERE CLASSIFIED   CORONARY ARTERY DISEASE   RENAL FAILURE   DERMATITIS, FACE   DM (diabetes mellitus)   Obesity   CKD (chronic kidney disease) stage 4, GFR 15-29 ml/min   COPD  (chronic obstructive pulmonary disease)   Urinary incontinence   Orthostatic hypotension   Hallucinations   Dementia    Time spent: 19min    Lancelot Alyea, [place] [facility] Pager [phone]. If 7PM-7AM, please contact night-coverage at [email or web address], password [facility] 01/17/2014, 11:04 AM  LOS: 2 days

## 2014-01-17 NOTE — Progress Notes (Signed)
Assisted pt to dangle on edge of bed and to take a few steps toward the bathroom with walker. After a few steps, pt stated, "Oh dear, I believe I'd better sit down" and I assisted pt to move backward and sit on edge of bed.  Immediately after this pt slumped over and was unresponsive for a few seconds.  Upon opening his eyes pt was alert and oriented and stated "This happens to me every morning, I pass out."  Assisted pt to get comfortable in the bed and paged Dr. Wyline Copas.  Orthostatic vital signs ordered, but upon returning to pt's room pt was sitting on bedside commode with nurse tech.  Tech instructed to assist pt back to bed and place in lying position.  Will wait a few minutes for BP to normalize and proceed with orthostatics.  Coolidge Breeze, RN 01/17/2014

## 2014-01-18 DIAGNOSIS — E119 Type 2 diabetes mellitus without complications: Secondary | ICD-10-CM

## 2014-01-18 MED ORDER — TRAZODONE HCL 100 MG PO TABS
100.0000 mg | ORAL_TABLET | Freq: Every evening | ORAL | Status: DC | PRN
  Administered 2014-01-21: 100 mg via ORAL
  Filled 2014-01-18: qty 1

## 2014-01-18 MED ORDER — AMLODIPINE BESYLATE 5 MG PO TABS
5.0000 mg | ORAL_TABLET | Freq: Every day | ORAL | Status: DC
  Administered 2014-01-18 – 2014-01-20 (×3): 5 mg via ORAL
  Filled 2014-01-18 (×4): qty 1

## 2014-01-18 MED ORDER — ZOLPIDEM TARTRATE 5 MG PO TABS
5.0000 mg | ORAL_TABLET | Freq: Once | ORAL | Status: AC
  Administered 2014-01-18: 5 mg via ORAL
  Filled 2014-01-18: qty 1

## 2014-01-18 MED ORDER — LISINOPRIL 5 MG PO TABS
5.0000 mg | ORAL_TABLET | Freq: Every day | ORAL | Status: DC
  Administered 2014-01-18 – 2014-01-20 (×3): 5 mg via ORAL
  Filled 2014-01-18 (×4): qty 1

## 2014-01-18 NOTE — Progress Notes (Signed)
Orthostatic BP: Lying: 173/77 Sitting: 122/95 Sitting (after 5 minutes): 168/82 Results discussed with MD, will hold on ambulation at this time. Stacey Drain

## 2014-01-18 NOTE — Progress Notes (Signed)
[facility] PROGRESS NOTE  [name] BSJ:[id number] DOB: Jun 05, 1923 DOA: 01/15/2014 PCP: Cathlean Cower, MD  Assessment/Plan: CAP - On ceftriaxone and azithromycin. Flu neg. Pt with leukocytosis resolved and no fevers Hallucinations - may be exacerbated by #1 versus progressive decline  Hypotension - he has been having a known history of orthostatic hypotension has been on Florinef in the past. Pt was on Midodrine, which was discontinued due to systolic blood pressure in the 190s. Have resumed midodrine. Titrate bp meds as tolerated. Syncope - syncope likely related to orthostatic hypotension yesterday. Will check orthostatic vitals. Started basal IVF and continued home dose of midodrine Hypertension - hydralazine as needed for systolic blood pressure greater than 180.  Coronary artery disease - continue home medications  Chronic kidney disease - baseline creatinine 1.3-1.5. He is at 1.4 now  Leukocytosis - likely secondary to pneumonia - resolved with abx COPD - continue inhalers  Hyperlipidemia - continue statin  Hypothyroidism - continue Synthroid  Weakness - PT consult  Code Status: DNR Family Communication: Pt in room (indicate person spoken with, relationship, and if by phone, the number) Disposition Plan: Pending  Antibiotics:  Azithromycin 01/15/14>>>  Ceftriaxone 01/15/14>>>  HPI/Subjective: Reported syncope yesterday.  Objective: Filed Vitals:   01/17/14 1148 01/17/14 1413 01/17/14 2025 01/18/14 0458  BP: 106/85 148/71 154/69 149/61  Pulse: 79 82 70 78  Temp:  97.5 F (36.4 C) 98.3 F (36.8 C) 98.6 F (37 C)  TempSrc:  Oral Oral Oral  Resp:  18 18 18   Height:      Weight:      SpO2:   96% 95%    Intake/Output Summary (Last 24 hours) at 01/18/14 1149 Last data filed at 01/18/14 1100  Gross per 24 hour  Intake 2621.67 ml  Output   1775 ml  Net 846.67 ml   Filed Weights   01/16/14 0121  Weight: 90.266 kg (199 lb)    Exam:   General:  Awake, in  nad  Cardiovascular: regular, s1, s2  Respiratory: normal resp effort, no wheezing  Abdomen: soft, nondistended  Musculoskeletal: perfused, no clubbing   Data Reviewed: Basic Metabolic Panel:  Recent Labs Lab 01/15/14 1940 01/17/14 0800  NA 136* 140  K 4.7 4.3  CL 101 104  CO2 24 26  GLUCOSE 102* 104*  BUN 33* 28*  CREATININE 1.65* 1.41*  CALCIUM 8.8 8.2*   Liver Function Tests: No results found for this basename: AST, ALT, ALKPHOS, BILITOT, PROT, ALBUMIN,  in the last 168 hours No results found for this basename: LIPASE, AMYLASE,  in the last 168 hours No results found for this basename: AMMONIA,  in the last 168 hours CBC:  Recent Labs Lab 01/15/14 1940 01/17/14 0800  WBC 14.0* 9.9  HGB 13.7 13.1  HCT 41.2 38.7*  MCV 105.9* 104.9*  PLT 307 251   Cardiac Enzymes:  Recent Labs Lab 01/15/14 1940  TROPONINI <0.30   BNP (last 3 results)  Recent Labs  04/16/13 2327  PROBNP 1599.0*   CBG: No results found for this basename: GLUCAP,  in the last 168 hours  Recent Results (from the past 240 hour(s))  CULTURE, BLOOD (ROUTINE X 2)     Status: None   Collection Time    01/16/14 12:35 AM      Result Value Range Status   Specimen Description BLOOD LEFT ANTECUBITAL   Final   Special Requests BOTTLES DRAWN AEROBIC AND ANAEROBIC 5CC   Final   Culture  Setup Time  Final   Value: 01/16/2014 03:57     Performed at [facility]   Culture     Final   Value:        BLOOD CULTURE RECEIVED NO GROWTH TO DATE CULTURE WILL BE HELD FOR 5 DAYS BEFORE ISSUING A FINAL NEGATIVE REPORT     Performed at [facility]   Report Status PENDING   Incomplete  CULTURE, BLOOD (ROUTINE X 2)     Status: None   Collection Time    01/16/14  1:03 AM      Result Value Range Status   Specimen Description BLOOD LEFT FOREARM   Final   Special Requests BOTTLES DRAWN AEROBIC AND ANAEROBIC 5CC   Final   Culture  Setup Time     Final   Value: 01/16/2014 03:57      Performed at [facility]   Culture     Final   Value:        BLOOD CULTURE RECEIVED NO GROWTH TO DATE CULTURE WILL BE HELD FOR 5 DAYS BEFORE ISSUING A FINAL NEGATIVE REPORT     Performed at [facility]   Report Status PENDING   Incomplete     Studies: No results found.  Scheduled Meds: . azithromycin  500 mg Oral Q24H  . cefTRIAXone (ROCEPHIN)  IV  1 g Intravenous Q24H  . clopidogrel  75 mg Oral Daily  . heparin  5,000 Units Subcutaneous Q8H  . levothyroxine  137 mcg Oral QAC breakfast  . methylPREDNISolone  6 mg Oral Daily  . midodrine  2.5 mg Oral BID WC  . QUEtiapine  50 mg Oral QHS  . risperiDONE  0.25 mg Oral Daily  . risperiDONE  0.5 mg Oral QHS  . sertraline  100 mg Oral QHS  . simvastatin  20 mg Oral q1800   Continuous Infusions: . sodium chloride 100 mL/hr at 01/18/14 0354    Principal Problem:   CAP (community acquired pneumonia) Active Problems:   HYPOTHYROIDISM   HYPERLIPIDEMIA   ANEMIA   DEPRESSIVE DISORDER NOT ELSEWHERE CLASSIFIED   CORONARY ARTERY DISEASE   RENAL FAILURE   DERMATITIS, FACE   DM (diabetes mellitus)   Obesity   CKD (chronic kidney disease) stage 4, GFR 15-29 ml/min   COPD (chronic obstructive pulmonary disease)   Urinary incontinence   Orthostatic hypotension   Hallucinations   Dementia    Time spent: 7min    [name], [facility] Pager [phone]. If 7PM-7AM, please contact night-coverage at [email or web address], password [facility] 01/18/2014, 11:49 AM  LOS: 3 days

## 2014-01-19 LAB — CORTISOL: Cortisol, Plasma: 5.1 ug/dL

## 2014-01-19 MED ORDER — POLYETHYLENE GLYCOL 3350 17 G PO PACK
17.0000 g | PACK | Freq: Every day | ORAL | Status: DC | PRN
  Filled 2014-01-19: qty 1

## 2014-01-19 MED ORDER — BISACODYL 5 MG PO TBEC
5.0000 mg | DELAYED_RELEASE_TABLET | Freq: Every day | ORAL | Status: DC | PRN
  Administered 2014-01-19: 5 mg via ORAL
  Filled 2014-01-19: qty 1

## 2014-01-19 MED ORDER — TRAMADOL HCL 50 MG PO TABS
50.0000 mg | ORAL_TABLET | Freq: Four times a day (QID) | ORAL | Status: DC | PRN
  Administered 2014-01-19 – 2014-01-21 (×2): 50 mg via ORAL
  Filled 2014-01-19 (×2): qty 1

## 2014-01-19 NOTE — Progress Notes (Signed)
Clinical Social Work Department BRIEF PSYCHOSOCIAL ASSESSMENT 01/19/2014  Patient:  [name], [name]     Account Number:  [id number]     Admit date:  01/15/2014  Clinical Social Worker:  Ulyess Blossom  Date/Time:  01/19/2014 11:21 AM  Referred by:  Care Management  Date Referred:  01/19/2014 Referred for  SNF Placement   Other Referral:   Interview type:  Patient Other interview type:   and patient daughter at bedside    PSYCHOSOCIAL DATA Living Status:  WIFE Admitted from facility:   Level of care:   Primary support name:  [name]/daughter/[phone] Primary support relationship to patient:  CHILD, ADULT Degree of support available:   strong    CURRENT CONCERNS Current Concerns  Post-Acute Placement   Other Concerns:    SOCIAL WORK ASSESSMENT / PLAN CSW received notification from [facility] that PT recommending SNF and pt initially declined SNF, but is now agreeable to SNF placement.    CSW met with pt at bedside to discuss. CSW introduced self and explained role. CSW discussed rehab at [facility] and pt agreeable to SNF search. Pt request [facility] search. Pt discussed that he wants his daughter and son to make decision about facility and provided permission for CSW to contact pt daughter. Pt discussed that he has been to rehab at a SNF in [place] in the past.    CSW spoke with pt daughter, Olegario Shearer via telephone. CSW discussed with pt daughter that pt is now agreeable to SNF and wanted pt daughter and pt son assistance with making decision. CSW inquired about pt placement in facility in [place] in that past and pt daughter discussed that pt was not happy at that facility and pt family was not pleased with the care there and pt family wants to concentrate on SNF facilities in [facility].    CSW completed FL2 and initiated SNF search to [facility]    CSW to follow up with pt daughter re: SNF bed offers. Pt daughter discussed that she could be reached at  work [phone] or cell [phone].    CSW to continue to follow to assist with pt disposition needs.   Assessment/plan status:  Psychosocial Support/Ongoing Assessment of Needs Other assessment/ plan:   discharge planning   Information/referral to community resources:   [facility] search    PATIENT'S/FAMILY'S RESPONSE TO PLAN OF CARE: Pt alert and oriented x 4. Pt initially hesitant about SNF for rehab, but agreeing to exploring options. Pt daughter expressed relief that pt more agreeable for rehab at SNF as pt wife has alzheimer' s dementia and is at home with caregivers. Pt family appears supportive and actively involved in pt care.    Alison Murray, MSW, LCSW Clinical Social Work Coverage for [employer] [phone]

## 2014-01-19 NOTE — ED Provider Notes (Signed)
Medical screening examination/treatment/procedure(s) were performed by non-physician practitioner and as supervising physician I was immediately available for consultation/collaboration.  EKG Interpretation    Date/Time:  Thursday January 15 2014 19:21:13 EST Ventricular Rate:  66 PR Interval:  173 QRS Duration: 103 QT Interval:  430 QTC Calculation: 450 R Axis:   11 Text Interpretation:  Sinus rhythm No significant change since last tracing Reconfirmed by Alvino Chapel  MD, Benford Asch ([id number]) on 01/19/2014 4:26:51 PM             Jasper Riling. Alvino Chapel, MD 01/19/14 [phone]

## 2014-01-19 NOTE — Progress Notes (Signed)
Clinical Social Work Department CLINICAL SOCIAL WORK PLACEMENT NOTE 01/19/2014  Patient:  [name], [name]  Account Number:  [id number] Admit date:  01/15/2014  Clinical Social Worker:  Ulyess Blossom  Date/time:  01/19/2014 11:29 AM  Clinical Social Work is seeking post-discharge placement for this patient at the following level of care:   SKILLED NURSING   (*CSW will update this form in Epic as items are completed)   01/19/2014  Patient/family provided with [place] Department of Clinical Social Work's list of facilities offering this level of care within the geographic area requested by the patient (or if unable, by the patient's family).  01/19/2014  Patient/family informed of their freedom to choose among providers that offer the needed level of care, that participate in Medicare, Medicaid or managed care program needed by the patient, have an available bed and are willing to accept the patient.  01/19/2014  Patient/family informed of [facility]' ownership interest in [facility], as well as of the fact that they are under no obligation to receive care at this facility.  PASARR submitted to EDS on 01/19/2014 PASARR number received from EDS on 01/19/2014  FL2 transmitted to all facilities in geographic area requested by pt/family on  01/19/2014 FL2 transmitted to all facilities within larger geographic area on   Patient informed that his/her managed care company has contracts with or will negotiate with  certain facilities, including the following:     Patient/family informed of bed offers received:   Patient chooses bed at  Physician recommends and patient chooses bed at    Patient to be transferred to  on   Patient to be transferred to facility by   The following physician request were entered in Epic:   Additional Comments:    Alison Murray, MSW, LCSW Clinical Social Work Coverage for [employer] [phone]

## 2014-01-19 NOTE — Progress Notes (Signed)
[facility] PROGRESS NOTE  [name] TWS:[id number] DOB: 1923/08/17 DOA: 01/15/2014 PCP: Cathlean Cower, MD  Assessment/Plan: CAP - On ceftriaxone and azithromycin. Flu neg. Pt with leukocytosis resolved and no fevers Hallucinations - may be exacerbated by #1 versus progressive decline  Hypotension - he has been having a known history of orthostatic hypotension has been on Florinef in the past. Pt was on Midodrine, which was discontinued due to systolic blood pressure in the 190s. Have resumed midodrine. Titrate bp meds as tolerated. On IVF.   Syncope - syncope likely related to orthostatic hypotension. Will cont to check orthostatic vitals. Started basal IVF and continued home dose of midodrine. TSH unremarkable, AM cortisol unremarkable. Question dehydration given elevated BUN. If no improvement despite IVF hydration, may consider inpt consult Hypertension - hydralazine as needed for systolic blood pressure greater than 180.  Coronary artery disease - continue home medications  Chronic kidney disease - baseline creatinine 1.3-1.5. He is at 1.4 now  Leukocytosis - likely secondary to pneumonia - resolved with abx COPD - continue inhalers  Hyperlipidemia - continue statin  Hypothyroidism - continue Synthroid  Weakness - PT consult - recs for SNF, however, pt refuses SNF  Code Status: DNR Family Communication: Pt in room (indicate person spoken with, relationship, and if by phone, the number) Disposition Plan: Pending  Antibiotics:  Azithromycin 01/15/14>>>  Ceftriaxone 01/15/14>>>  HPI/Subjective: Cont with dizziness on standing  Objective: Filed Vitals:   01/19/14 0605 01/19/14 0838 01/19/14 0840 01/19/14 0844  BP: 150/76 153/65 144/71 107/53  Pulse: 72 59 64 66  Temp: 97.6 F (36.4 C)     TempSrc: Oral     Resp: 18     Height:      Weight:      SpO2: 96% 97% 97% 97%    Intake/Output Summary (Last 24 hours) at 01/19/14 1440 Last data filed at 01/19/14 0017  Gross  per 24 hour  Intake   2992 ml  Output   2125 ml  Net    867 ml   Filed Weights   01/16/14 0121  Weight: 90.266 kg (199 lb)    Exam:   General:  Awake, in nad  Cardiovascular: regular, s1, s2  Respiratory: normal resp effort, no wheezing  Abdomen: soft, nondistended  Musculoskeletal: perfused, no clubbing   Data Reviewed: Basic Metabolic Panel:  Recent Labs Lab 01/15/14 1940 01/17/14 0800  NA 136* 140  K 4.7 4.3  CL 101 104  CO2 24 26  GLUCOSE 102* 104*  BUN 33* 28*  CREATININE 1.65* 1.41*  CALCIUM 8.8 8.2*   Liver Function Tests: No results found for this basename: AST, ALT, ALKPHOS, BILITOT, PROT, ALBUMIN,  in the last 168 hours No results found for this basename: LIPASE, AMYLASE,  in the last 168 hours No results found for this basename: AMMONIA,  in the last 168 hours CBC:  Recent Labs Lab 01/15/14 1940 01/17/14 0800  WBC 14.0* 9.9  HGB 13.7 13.1  HCT 41.2 38.7*  MCV 105.9* 104.9*  PLT 307 251   Cardiac Enzymes:  Recent Labs Lab 01/15/14 1940  TROPONINI <0.30   BNP (last 3 results)  Recent Labs  04/16/13 2327  PROBNP 1599.0*   CBG: No results found for this basename: GLUCAP,  in the last 168 hours  Recent Results (from the past 240 hour(s))  CULTURE, BLOOD (ROUTINE X 2)     Status: None   Collection Time    01/16/14 12:35 AM  Result Value Range Status   Specimen Description BLOOD LEFT ANTECUBITAL   Final   Special Requests BOTTLES DRAWN AEROBIC AND ANAEROBIC 5CC   Final   Culture  Setup Time     Final   Value: 01/16/2014 03:57     Performed at [facility]   Culture     Final   Value:        BLOOD CULTURE RECEIVED NO GROWTH TO DATE CULTURE WILL BE HELD FOR 5 DAYS BEFORE ISSUING A FINAL NEGATIVE REPORT     Performed at [facility]   Report Status PENDING   Incomplete  CULTURE, BLOOD (ROUTINE X 2)     Status: None   Collection Time    01/16/14  1:03 AM      Result Value Range Status   Specimen Description  BLOOD LEFT FOREARM   Final   Special Requests BOTTLES DRAWN AEROBIC AND ANAEROBIC 5CC   Final   Culture  Setup Time     Final   Value: 01/16/2014 03:57     Performed at [facility]   Culture     Final   Value:        BLOOD CULTURE RECEIVED NO GROWTH TO DATE CULTURE WILL BE HELD FOR 5 DAYS BEFORE ISSUING A FINAL NEGATIVE REPORT     Performed at [facility]   Report Status PENDING   Incomplete     Studies: No results found.  Scheduled Meds: . amLODipine  5 mg Oral Daily  . azithromycin  500 mg Oral Q24H  . cefTRIAXone (ROCEPHIN)  IV  1 g Intravenous Q24H  . clopidogrel  75 mg Oral Daily  . heparin  5,000 Units Subcutaneous Q8H  . levothyroxine  137 mcg Oral QAC breakfast  . lisinopril  5 mg Oral Daily  . methylPREDNISolone  6 mg Oral Daily  . midodrine  2.5 mg Oral BID WC  . QUEtiapine  50 mg Oral QHS  . risperiDONE  0.25 mg Oral Daily  . risperiDONE  0.5 mg Oral QHS  . sertraline  100 mg Oral QHS  . simvastatin  20 mg Oral q1800   Continuous Infusions: . sodium chloride 100 mL/hr at 01/19/14 1221    Principal Problem:   CAP (community acquired pneumonia) Active Problems:   HYPOTHYROIDISM   HYPERLIPIDEMIA   ANEMIA   DEPRESSIVE DISORDER NOT ELSEWHERE CLASSIFIED   CORONARY ARTERY DISEASE   RENAL FAILURE   DERMATITIS, FACE   DM (diabetes mellitus)   Obesity   CKD (chronic kidney disease) stage 4, GFR 15-29 ml/min   COPD (chronic obstructive pulmonary disease)   Urinary incontinence   Orthostatic hypotension   Hallucinations   Dementia    Time spent: 47min    [facility], [place] [facility] Pager [phone]. If 7PM-7AM, please contact night-coverage at [email or web address], password [facility] 01/19/2014, 2:40 PM  LOS: 4 days

## 2014-01-19 NOTE — Progress Notes (Signed)
Physical Therapy Treatment Patient Details Name: DEANDREA RION MRN: [id number] DOB: Jul 29, 1923 Today's Date: 01/19/2014 Time: 1100-1140 PT Time Calculation (min): 40 min  PT Assessment / Plan / Recommendation  History of Present Illness 80 y.o. male with complex medical history as below, including coronary artery disease, paroxysmal A. fib, diabetes, intermittent confusion with hallucinations, CKD, presents with a chief complaint of worsening hallucinations, worsening weakness at home at 3 falls in the last couple of days. He denies any fever or chills, denies any shortness of breath or cough, denies any chest pain, denies any abdominal pain nausea vomiting or diarrhea. He also has a history of orthostatic hypotension for which he was on Florinef in the past, now he is taking Midodrine for that. Family states that his hallucinations have been getting worse in the last couple of weeks. He does have insight into some hallucinations at times, but tries to interact with others. In the emergency room, blood work pertinent for leukocytosis of 14 and a chest x-ray suggestive of pneumonia.   PT Comments   Assisted OOB to amb in hallway.  Pt requires increased time and freq rest breaks due to c/o dizzyness and max c/o fatigue.  Pt is unsteady and would benefit from ST Rehab at SNF prior to returning home.   Follow Up Recommendations  SNF     Does the patient have the potential to tolerate intense rehabilitation     Barriers to Discharge        Equipment Recommendations  None recommended by PT    Recommendations for Other Services    Frequency Min 3X/week   Progress towards PT Goals Progress towards PT goals: Progressing toward goals  Plan      Precautions / Restrictions Precautions Precautions: Fall Restrictions Weight Bearing Restrictions: No    Pertinent Vitals/Pain No c/o pain    Mobility  Bed Mobility Overal bed mobility: Needs Assistance Bed Mobility: Supine to Sit Supine to  sit: Mod assist General bed mobility comments: assist to rise upper body and increased time Transfers Overall transfer level: Needs assistance Equipment used: Rolling walker (2 wheeled) Sit to Stand: Min assist General transfer comment: verbal cues for safe technique, assist to rise and control descent Ambulation/Gait Ambulation Distance (Feet): 85 Feet (11', 14', 60' sitting rest breaks due to dizzyness) Assistive device: Rolling walker (2 wheeled) Gait Pattern/deviations: Step-through pattern;Trunk flexed Gait velocity: decr General Gait Details: c/o dizzyness with first 2 attempts to amb.  Bp standing 104/52.  Chair closely following behind. Each walk pt c/o less dizzyness.      PT Goals (current goals can now be found in the care plan section)    Visit Information  Last PT Received On: 01/19/14 Assistance Needed: +1 History of Present Illness: 80 y.o. male with complex medical history as below, including coronary artery disease, paroxysmal A. fib, diabetes, intermittent confusion with hallucinations, CKD, presents with a chief complaint of worsening hallucinations, worsening weakness at home at 3 falls in the last couple of days. He denies any fever or chills, denies any shortness of breath or cough, denies any chest pain, denies any abdominal pain nausea vomiting or diarrhea. He also has a history of orthostatic hypotension for which he was on Florinef in the past, now he is taking Midodrine for that. Family states that his hallucinations have been getting worse in the last couple of weeks. He does have insight into some hallucinations at times, but tries to interact with others. In the emergency room, blood work  pertinent for leukocytosis of 14 and a chest x-ray suggestive of pneumonia.    Subjective Data      Cognition       Balance     End of Session PT - End of Session Equipment Utilized During Treatment: Gait belt Activity Tolerance: Patient limited by fatigue Patient  left: in chair;with call bell/phone within reach Nurse Communication: Mobility status   Rica Koyanagi  PTA [facility]  Acute  Rehab Pager      [phone]

## 2014-01-19 NOTE — Progress Notes (Signed)
CSW spoke with pt daughter via telephone and provided SNF bed offers.  CSW left bed offers at bedside as well.  Per pt daughter, pt daughter and pt son plan to meet this evening to review bed offers and determine where they would like for pt to go to rehab.  CSW to follow up with pt and pt family re: decision for SNF placement.  CSW to continue to follow to assist with disposition planning.  Alison Murray, MSW, LCSW Clinical Social Work Coverage for [employer] [phone]

## 2014-01-20 DIAGNOSIS — H109 Unspecified conjunctivitis: Secondary | ICD-10-CM

## 2014-01-20 DIAGNOSIS — I951 Orthostatic hypotension: Secondary | ICD-10-CM

## 2014-01-20 DIAGNOSIS — G4733 Obstructive sleep apnea (adult) (pediatric): Secondary | ICD-10-CM

## 2014-01-20 LAB — BASIC METABOLIC PANEL
BUN: 27 mg/dL — AB (ref 6–23)
CHLORIDE: 107 meq/L (ref 96–112)
CO2: 25 mEq/L (ref 19–32)
CREATININE: 1.45 mg/dL — AB (ref 0.50–1.35)
Calcium: 8 mg/dL — ABNORMAL LOW (ref 8.4–10.5)
GFR calc Af Amer: 48 mL/min — ABNORMAL LOW (ref >90)
GFR calc non Af Amer: 41 mL/min — ABNORMAL LOW (ref >90)
GLUCOSE: 92 mg/dL (ref 70–99)
Potassium: 5 mEq/L (ref 3.7–5.3)
Sodium: 141 mEq/L (ref 137–147)

## 2014-01-20 MED ORDER — NEOMYCIN-POLYMYXIN-DEXAMETH 3.5-10000-0.1 OP SUSP
2.0000 [drp] | Freq: Four times a day (QID) | OPHTHALMIC | Status: DC
  Administered 2014-01-20 – 2014-01-21 (×6): 2 [drp] via OPHTHALMIC
  Filled 2014-01-20: qty 5

## 2014-01-20 NOTE — Progress Notes (Addendum)
[facility] PROGRESS NOTE  [name] [id number] DOB: 1923/09/09 DOA: 01/15/2014 PCP: Cathlean Cower, MD  Off Service Summary: [phone] presents with confusion with CAP. Improved with rocephin and azithro. Also has hx of chronic orthostatic hypotension on midodrine. Presented hypertensive so midodrine was stopped.  Plan was for discharge over the weekend, however, the patient was noted to be orthostatic so midodrine was resumed with continued IVF. Pt continued to be orthostatic so Nephrology has been consulted to assist with orthostatic hypotension.  Assessment/Plan: CAP - On ceftriaxone and azithromycin. Flu neg. Pt with leukocytosis resolved and no fevers Hallucinations - may be exacerbated by #1 versus progressive decline. Resolved Hypotension - he has been having a known history of orthostatic hypotension has been on Florinef in the past. Pt was on Midodrine, which was initially discontinued due to systolic blood pressure in the 190s. Have resumed midodrine. Titrate bp meds as tolerated. On IVF. Follow orthostatic vitals Syncope - syncope likely related to orthostatic hypotension. Will cont to check orthostatic vitals. Started basal IVF and continued home dose of midodrine. TSH unremarkable, AM cortisol unremarkable. Question dehydration given elevated BUN. Renal function near baseline. Consider Nephrology consult if still orthostatic Hypertension - hydralazine as needed for systolic blood pressure greater than 180.  Coronary artery disease - continue home medications  Chronic kidney disease - baseline creatinine 1.3-1.5. He is at 1.4 now  Leukocytosis - likely secondary to pneumonia - resolved with abx COPD - continue inhalers  Hyperlipidemia - continue statin  Hypothyroidism - continue Synthroid  Weakness - PT consult - recs for SNF, however, pt refuses SNF Conjunctivitis - L eye with increased injection and drainage. Will start empiric abx eye drops  Code Status: DNR Family  Communication: Pt in room (indicate person spoken with, relationship, and if by phone, the number) Disposition Plan: Pt refuses SNF - for home PT when discharged  Antibiotics:  Azithromycin 01/15/14>>>  Ceftriaxone 01/15/14>>>  HPI/Subjective: Still with dizziness on standing  Objective: Filed Vitals:   01/19/14 0844 01/19/14 1449 01/19/14 2047 01/20/14 0519  BP: 107/53 125/59 171/73 170/71  Pulse: 66 62 63 61  Temp:  97.1 F (36.2 C) 97.2 F (36.2 C) 97.5 F (36.4 C)  TempSrc:  Oral Oral Oral  Resp:  20 20 16   Height:      Weight:      SpO2: 97% 96% 98% 96%    Intake/Output Summary (Last 24 hours) at 01/20/14 0807 Last data filed at 01/20/14 0523  Gross per 24 hour  Intake   2225 ml  Output   2530 ml  Net   -305 ml   Filed Weights   01/16/14 0121  Weight: 90.266 kg (199 lb)    Exam:   General:  Awake, in nad  Cardiovascular: regular, s1, s2  Respiratory: normal resp effort, no wheezing  Abdomen: soft, nondistended  Musculoskeletal: perfused, no clubbing   Data Reviewed: Basic Metabolic Panel:  Recent Labs Lab 01/15/14 1940 01/17/14 0800 01/20/14 0435  NA 136* 140 141  K 4.7 4.3 5.0  CL 101 104 107  CO2 24 26 25   GLUCOSE 102* 104* 92  BUN 33* 28* 27*  CREATININE 1.65* 1.41* 1.45*  CALCIUM 8.8 8.2* 8.0*   Liver Function Tests: No results found for this basename: AST, ALT, ALKPHOS, BILITOT, PROT, ALBUMIN,  in the last 168 hours No results found for this basename: LIPASE, AMYLASE,  in the last 168 hours No results found for this basename: AMMONIA,  in the last 168  hours CBC:  Recent Labs Lab 01/15/14 1940 01/17/14 0800  WBC 14.0* 9.9  HGB 13.7 13.1  HCT 41.2 38.7*  MCV 105.9* 104.9*  PLT 307 251   Cardiac Enzymes:  Recent Labs Lab 01/15/14 1940  TROPONINI <0.30   BNP (last 3 results)  Recent Labs  04/16/13 2327  PROBNP 1599.0*   CBG: No results found for this basename: GLUCAP,  in the last 168 hours  Recent Results  (from the past 240 hour(s))  CULTURE, BLOOD (ROUTINE X 2)     Status: None   Collection Time    01/16/14 12:35 AM      Result Value Range Status   Specimen Description BLOOD LEFT ANTECUBITAL   Final   Special Requests BOTTLES DRAWN AEROBIC AND ANAEROBIC 5CC   Final   Culture  Setup Time     Final   Value: 01/16/2014 03:57     Performed at [facility]   Culture     Final   Value:        BLOOD CULTURE RECEIVED NO GROWTH TO DATE CULTURE WILL BE HELD FOR 5 DAYS BEFORE ISSUING A FINAL NEGATIVE REPORT     Performed at [facility]   Report Status PENDING   Incomplete  CULTURE, BLOOD (ROUTINE X 2)     Status: None   Collection Time    01/16/14  1:03 AM      Result Value Range Status   Specimen Description BLOOD LEFT FOREARM   Final   Special Requests BOTTLES DRAWN AEROBIC AND ANAEROBIC 5CC   Final   Culture  Setup Time     Final   Value: 01/16/2014 03:57     Performed at [facility]   Culture     Final   Value:        BLOOD CULTURE RECEIVED NO GROWTH TO DATE CULTURE WILL BE HELD FOR 5 DAYS BEFORE ISSUING A FINAL NEGATIVE REPORT     Performed at [facility]   Report Status PENDING   Incomplete     Studies: No results found.  Scheduled Meds: . amLODipine  5 mg Oral Daily  . azithromycin  500 mg Oral Q24H  . cefTRIAXone (ROCEPHIN)  IV  1 g Intravenous Q24H  . clopidogrel  75 mg Oral Daily  . heparin  5,000 Units Subcutaneous Q8H  . levothyroxine  137 mcg Oral QAC breakfast  . lisinopril  5 mg Oral Daily  . methylPREDNISolone  6 mg Oral Daily  . midodrine  2.5 mg Oral BID WC  . neomycin-polymyxin b-dexamethasone  2 drop Both Eyes Q6H  . QUEtiapine  50 mg Oral QHS  . risperiDONE  0.25 mg Oral Daily  . risperiDONE  0.5 mg Oral QHS  . sertraline  100 mg Oral QHS  . simvastatin  20 mg Oral q1800   Continuous Infusions: . sodium chloride 100 mL/hr (01/19/14 2150)    Principal Problem:   CAP (community acquired pneumonia) Active Problems:    HYPOTHYROIDISM   HYPERLIPIDEMIA   ANEMIA   DEPRESSIVE DISORDER NOT ELSEWHERE CLASSIFIED   CORONARY ARTERY DISEASE   RENAL FAILURE   DERMATITIS, FACE   DM (diabetes mellitus)   Obesity   CKD (chronic kidney disease) stage 4, GFR 15-29 ml/min   COPD (chronic obstructive pulmonary disease)   Urinary incontinence   Orthostatic hypotension   Hallucinations   Dementia    Time spent: 91min    Clance Baquero, [facility] Pager [phone].  If 7PM-7AM, please contact night-coverage at [email or web address], password [facility] 01/20/2014, 8:07 AM  LOS: 5 days

## 2014-01-20 NOTE — Progress Notes (Signed)
CSW spoke with patient's son, Rush Landmark. He stated that they did not decide on facility last night, they will decide this evening. CSW to follow up for choice.  Danel Requena C. Ephraim MSW, [place]

## 2014-01-20 NOTE — Consult Note (Addendum)
Renal Service Consult Note [facility]  [name] 01/20/2014 Purdin D Requesting Physician:  Dr Wyline Copas  Reason for Consult:  Orthostatic hypotension, CKD, syncope HPI: The patient is a 80 y.o. year-old with hx of CAD, mild AS, PAF, hypoT4, and hx of orthostatic hypotension which has been treated in the past with Fluorinef and more recently with midodrine.  He is 80 yo and was in [place] at [place]. He was admitted on 1/23 with worsening weakness at home, 3 falls in 2 days and worsening hallucinations.  IN ED WBC was 14k and CXR suggested PNA.  Midodrine was held during this stay due to high BP's and now pt is orthostatic and renal svc asked to evaluate.    Patient without complaints at this time  Past Medical History  Past Medical History  Diagnosis Date  . CAD (coronary artery disease)     s/p NSTEMI and BMS stent diagonal07/09;  Lexiscan Myoview 9/13:  EF 62%, no ischemia  . AS (aortic stenosis)     a.  Echo 2009 mean gradient 32m HG. AVA 2.18;  b.  Echo 4/11 mild AS mean gradient 153mHG;  c.  Echo 04/2011: Mild LVH, EF 5522-02%grade 1 diastolic dysfunction, mild aortic stenosis, mean gradient 14, mild LAE;  d. Echo 9/13: mod LVH, EF 50-55%, mild to mod AS, mean gradient 17 mmHg  . AF (paroxysmal atrial fibrillation)     Holter monitor 2.5 sec pauses  . Fatigue     CPX 11/09 VO2  14.4 (75% predicte)d, slope 35.  O2 pulse normal.  VO2 corrected for body weight 17.6.  . Marland Kitchenypothyroidism   . Hyperlipidemia   . History of prostate cancer   . Allergic rhinitis   . DM (diabetes mellitus)     type II diet controlled  . Shingles   . Left carotid stenosis   . Vitamin B 12 deficiency   . Anemia   . Osteoarthritis   . Nephrolithiasis   . Obesity   . OSA (obstructive sleep apnea)     AHI 15 PSG 09/06/08  . Hx of colonoscopy   . Stroke   . Anxiety and depression   . PMR (polymyalgia rheumatica) 05/29/2012  . Hypogonadism male 05/29/2012  .  Hyperparathyroidism 05/29/2012  . TIA (transient ischemic attack) 05/29/2012  . CKD (chronic kidney disease) stage 4, GFR 15-29 ml/min 05/29/2012  . COPD (chronic obstructive pulmonary disease) 05/29/2012  . Myocardial infarction   . Cancer     prostate ca history   Past Surgical History  Past Surgical History  Procedure Laterality Date  . Appendectomy    . Hernia repair    . Right carpal tunnel release    . Lumbar spine surgery    . Cystectomy      neck  . Prostatectomy    . Vasectomy    . Total knee arthroplasty      right  . Cholecystectomy     Family History  Family History  Problem Relation Age of Onset  . Stroke Mother [id number]. Coronary artery disease      father, brother, sister  . Alcohol abuse Father 698 Social History  reports that he quit smoking about 55 years ago. His smoking use included Cigarettes. He has a 12.5 pack-year smoking history. He quit smokeless tobacco use about 43 years ago. His smokeless tobacco use included Snuff and Chew. He reports that he does not drink alcohol or use illicit  drugs. Allergies  Allergies  Allergen Reactions  . Bactrim [Sulfamethoxazole-Trimethoprim] Other (See Comments)    hallucinations  . Horse-Derived Products Hives   Home medications Prior to Admission medications   Medication Sig Start Date End Date Taking? Authorizing Provider  albuterol (PROVENTIL HFA;VENTOLIN HFA) 108 (90 BASE) MCG/ACT inhaler Inhale 2 puffs into the lungs every 6 (six) hours as needed for wheezing or shortness of breath. 11/29/12  Yes Biagio Borg, MD  Ascorbic Acid (VITAMIN C) 500 MG tablet Take 500 mg by mouth at bedtime.    Yes Historical Provider, MD  cholecalciferol (VITAMIN D) 1000 UNITS tablet Take 1,000 Units by mouth at bedtime.    Yes Historical Provider, MD  clopidogrel (PLAVIX) 75 MG tablet Take 75 mg by mouth daily.   Yes Historical Provider, MD  diclofenac sodium (VOLTAREN) 1 % GEL Apply 2 g topically 4 (four) times daily. 04/30/13  Yes Ivan Anchors  Love, PA-C  docusate sodium 100 MG CAPS Take 100 mg by mouth 2 (two) times daily. 12/30/12  Yes Thurnell Lose, MD  famotidine (PEPCID) 20 MG tablet Take 1 tablet (20 mg total) by mouth 2 (two) times daily. 07/10/13  Yes Biagio Borg, MD  folic acid (FOLVITE) 1 MG tablet Take 1 mg by mouth daily.   Yes Historical Provider, MD  hyoscyamine (LEVSIN, ANASPAZ) 0.125 MG tablet Take 0.125 mg by mouth 4 (four) times daily as needed. Gas pain.   Yes Historical Provider, MD  Iron-Vitamins (GERITOL COMPLETE PO) Take 1 tablet by mouth daily.   Yes Historical Provider, MD  levofloxacin (LEVAQUIN) 250 MG tablet Take 1 tablet (250 mg total) by mouth daily. 01/09/14  Yes Biagio Borg, MD  levothyroxine (SYNTHROID, LEVOTHROID) 137 MCG tablet Take 137 mcg by mouth daily.   Yes Historical Provider, MD  meclizine (ANTIVERT) 25 MG tablet Take 1 tablet (25 mg total) by mouth 3 (three) times daily as needed for dizziness. 08/08/13  Yes Barton Dubois, MD  methotrexate (RHEUMATREX) 2.5 MG tablet Take 10 mg by mouth once a week. Takes 4 tablets weekly on Wednesday.   Caution:Chemotherapy. Protect from light.   Yes Historical Provider, MD  methylPREDNISolone (MEDROL) 4 MG tablet Take 6 mg by mouth daily.    Yes Historical Provider, MD  midodrine (PROAMATINE) 2.5 MG tablet Take 2.5 mg by mouth 3 (three) times daily. Take one tablet at 7 am, 2pm and at 4pm 09/23/13  Yes Minus Breeding, MD  mirabegron ER (MYRBETRIQ) 25 MG TB24 tablet Take 25 mg by mouth at bedtime.   Yes Historical Provider, MD  pravastatin (PRAVACHOL) 40 MG tablet Take 40 mg by mouth daily.   Yes Historical Provider, MD  QUEtiapine (SEROQUEL) 50 MG tablet Take 1 tablet (50 mg total) by mouth at bedtime. 01/09/14  Yes Biagio Borg, MD  risperiDONE (RISPERDAL) 0.25 MG tablet Take 0.25-0.5 mg by mouth 2 (two) times daily. 1 tab by mouth in the AM and 2 tab in the PM 11/05/13  Yes Biagio Borg, MD  sertraline (ZOLOFT) 100 MG tablet Take 100 mg by mouth at bedtime.   Yes  Historical Provider, MD  traMADol (ULTRAM) 50 MG tablet Take 1 tablet (50 mg total) by mouth every 6 (six) hours as needed. 11/25/13  Yes Biagio Borg, MD  azithromycin (ZITHROMAX) 500 MG tablet Take 1 tablet (500 mg total) by mouth daily. 01/17/14   Donne Hazel, MD  cefpodoxime (VANTIN) 200 MG tablet Take 1 tablet (200 mg total) by  mouth 2 (two) times daily. 01/17/14   Donne Hazel, MD   Liver Function Tests No results found for this basename: AST, ALT, ALKPHOS, BILITOT, PROT, ALBUMIN,  in the last 168 hours No results found for this basename: LIPASE, AMYLASE,  in the last 168 hours CBC  Recent Labs Lab 01/15/14 1940 01/17/14 0800  WBC 14.0* 9.9  HGB 13.7 13.1  HCT 41.2 38.7*  MCV 105.9* 104.9*  PLT 307 110   Basic Metabolic Panel  Recent Labs Lab 01/15/14 1940 01/17/14 0800 01/20/14 0435  NA 136* 140 141  K 4.7 4.3 5.0  CL 101 104 107  CO2 '24 26 25  ' GLUCOSE 102* 104* 92  BUN 33* 28* 27*  CREATININE 1.65* 1.41* 1.45*  CALCIUM 8.8 8.2* 8.0*    Exam  Blood pressure 134/74, pulse 67, temperature 98.7 F (37.1 C), temperature source Oral, resp. rate 18, height '5\' 8"'  (1.727 m), weight 90.266 kg (199 lb), SpO2 95.00%. Standing BP 108/70 Alert, elderly WM in no distress No rash, cyanosis or gangrene Sclera anicteric, throat clear No jvd Chest clear bilat RRR 2/6 SEM Abd soft, nt, no ascites or HSM GU w condom cath, clear yellow urine in bag No LE or UE edema Neuro is nf, ox3, pleasant  Assessment 1. Orthostatic hypotension- this is not new for this patient.  He most likely needs the midodrine to help protect against syncope and it's complications. People on midodrine for this condition are at risk of night-time/recumbent HTN, which is why the last dose of the day should be no later than 5 pm.  Also, daytime BP measurements with the drug on board should be done in the standing position, not lying.  Would resume midodrine at prior dose 2.5 mg tid at breakfast, lunch and  dinner. Do not record lying BP's during the day while in the hospital, only standing BP's.  You can use BP meds to treat HTN if it occurs on midodrine but if BP is measured properly this is not likely to be necessary. Stop IVF's. Pt is euvolemic 2. CKD stage III, eGFR 45 ml/min 3. HTN- this is likely supine HTN due to midodrine and would not treat, will stop BP meds.    Rec- as above.     Kelly Splinter MD (pgr) [phone]    [phone] 01/20/2014, 4:02 PM  ]

## 2014-01-21 DIAGNOSIS — J309 Allergic rhinitis, unspecified: Secondary | ICD-10-CM

## 2014-01-21 LAB — CBC
HEMATOCRIT: 33.7 % — AB (ref 39.0–52.0)
HEMOGLOBIN: 10.8 g/dL — AB (ref 13.0–17.0)
MCH: 34.6 pg — ABNORMAL HIGH (ref 26.0–34.0)
MCHC: 32 g/dL (ref 30.0–36.0)
MCV: 108 fL — AB (ref 78.0–100.0)
Platelets: 248 10*3/uL (ref 150–400)
RBC: 3.12 MIL/uL — AB (ref 4.22–5.81)
RDW: 14.8 % (ref 11.5–15.5)
WBC: 10.7 10*3/uL — ABNORMAL HIGH (ref 4.0–10.5)

## 2014-01-21 LAB — BASIC METABOLIC PANEL
BUN: 30 mg/dL — ABNORMAL HIGH (ref 6–23)
CO2: 26 meq/L (ref 19–32)
CREATININE: 1.54 mg/dL — AB (ref 0.50–1.35)
Calcium: 8 mg/dL — ABNORMAL LOW (ref 8.4–10.5)
Chloride: 107 mEq/L (ref 96–112)
GFR calc Af Amer: 44 mL/min — ABNORMAL LOW (ref >90)
GFR calc non Af Amer: 38 mL/min — ABNORMAL LOW (ref >90)
Glucose, Bld: 98 mg/dL (ref 70–99)
Potassium: 4.9 mEq/L (ref 3.7–5.3)
Sodium: 141 mEq/L (ref 137–147)

## 2014-01-21 MED ORDER — NEOMYCIN-POLYMYXIN-DEXAMETH 3.5-10000-0.1 OP SUSP: 2.0000 [drp] | Freq: Four times a day (QID) | OPHTHALMIC | Status: DC

## 2014-01-21 MED ORDER — POLYETHYLENE GLYCOL 3350 17 G PO PACK: 17.0000 g | PACK | Freq: Every day | ORAL | Status: DC | PRN

## 2014-01-21 MED ORDER — BISACODYL 5 MG PO TBEC: 5.0000 mg | DELAYED_RELEASE_TABLET | Freq: Every day | ORAL | Status: DC | PRN

## 2014-01-21 MED ORDER — METHOTREXATE 2.5 MG PO TABS
10.0000 mg | ORAL_TABLET | ORAL | Status: DC
  Administered 2014-01-21: 10 mg via ORAL
  Filled 2014-01-21: qty 4

## 2014-01-21 MED ORDER — TRAMADOL HCL 50 MG PO TABS: 50.0000 mg | ORAL_TABLET | Freq: Four times a day (QID) | ORAL | Status: DC | PRN

## 2014-01-21 MED ORDER — DEXTROSE 5 % IV SOLN
1.0000 g | Freq: Once | INTRAVENOUS | Status: AC
  Administered 2014-01-21: 1 g via INTRAVENOUS
  Filled 2014-01-21: qty 10

## 2014-01-21 NOTE — Discharge Summary (Addendum)
Physician Discharge Summary  [name] [id number] DOB: [birth year] DOA: 01/15/2014  PCP: Cathlean Cower, MD  Admit date: 01/15/2014 Discharge date: 01/21/2014  Recommendations for Outpatient Follow-up:  1. Follow up with primary care doctor in 2 weeks for repeat BP check and BMP, CBC to f/u creatinine and anemia 2. Patient completed all of his systemic antibiotics while in the hospital 3. Continue eye drops through 2/2, then stop.  Discharge Diagnoses:  Principal Problem:   CAP (community acquired pneumonia) Active Problems:   HYPOTHYROIDISM   HYPERLIPIDEMIA   ANEMIA   DEPRESSIVE DISORDER NOT ELSEWHERE CLASSIFIED   CORONARY ARTERY DISEASE   RENAL FAILURE   DERMATITIS, FACE   DM (diabetes mellitus)   Obesity   CKD (chronic kidney disease) stage 4, GFR 15-29 ml/min   COPD (chronic obstructive pulmonary disease)   Urinary incontinence   Orthostatic hypotension   Hallucinations   Dementia   Discharge Condition: stable, improved  Diet recommendation: healthy heart  Wt Readings from Last 3 Encounters:  01/16/14 90.266 kg (199 lb)  01/09/14 89.359 kg (197 lb)  11/25/13 89.359 kg (197 lb)    History of present illness:  [name] is a 80 y.o. male with complex medical history as below, including coronary artery disease, paroxysmal A. fib, diabetes, intermittent confusion with hallucinations, CKD, presents with a chief complaint of worsening hallucinations, worsening weakness at home at 3 falls in the last couple of days. He denies any fever or chills, denies any shortness of breath or cough, denies any chest pain, denies any abdominal pain nausea vomiting or diarrhea. He also has a history of orthostatic hypotension for which he was on Florinef in the past, now he is taking Midodrine for that. Family states that his hallucinations have been getting worse in the last couple of weeks. He does have insight into some hallucinations at times, but tries to interact with others. In  the emergency room, blood work pertinent for leukocytosis of 14 and a chest x-ray suggestive of pneumonia.  Hospital Course:  CAP at [facility].  Influenza test was negative. He was started on ceftriaxone and azithromycin.  He remained on room air, his leukocytosis resolved. He remained afebrile.  He should complete one additional day of antibiotics.  Hallucinations - were likely secondary to pneumonia and delirium and resolved. His head CT demonstrated no acute abnormalities. He is at his defined the dictation on the day of discharge.   Hypotension - he has been having a known history of orthostatic hypotension has been on Florinef in the past. Pt was on Midodrine, which was initially discontinued due to systolic blood pressure in the 190s.  Nephrology with consultation and they recommended checking his blood pressures only when he was standing upright. His supine blood pressures remain very elevated, however his upright blood pressures were low. He was restarted on his midodrine, and his pressures normalized. His lightheadedness and symptoms of orthostasis improved. His other blood pressure medications were discontinued. Recommended he followup with his primary care doctor in 2 weeks to check his blood pressure.   Syncope - syncope likely related to orthostatic hypotension.  He was given IV fluids. TSH unremarkable, AM cortisol unremarkable.  Telemetry:  Normal sinus rhythm.  Troponin neg.  Renal function near baseline. His midodrine had been held. It was restarted.  Hypertension - supine HTN due to midodrine Coronary artery disease - continued home medications, including plavix, statin.  No BB due to orthostatic hypotension Chronic kidney disease - baseline creatinine 1.3-1.5, stable.  Leukocytosis - likely secondary to pneumonia - resolved with abx  COPD - continue inhalers  Hyperlipidemia - continue statin  Hypothyroidism - continue Synthroid  Weakness - PT consult - and patient amenable to  rehab. Conjunctivitis - L eye with increased injection and drainage.  Continue empiric abx eye drops Anemia of chronic disease.  hgb stable.  Repeat hgb as outpatient in a few weeks.  Procedures:  CT head  CXR  Consultations:  Nephrology  Antibiotics:  Azithromycin 01/15/14>>>  Ceftriaxone 01/15/14>>>   Discharge Exam: Filed Vitals:   01/21/14 1100  BP: 131/62  Pulse: 60  Temp: 98 F (36.7 C)  Resp:    Filed Vitals:   01/20/14 2203 01/21/14 0508 01/21/14 0517 01/21/14 1100  BP: 163/66 149/66 125/65 131/62  Pulse: 69 65  60  Temp: 98.1 F (36.7 C) 97.7 F (36.5 C)  98 F (36.7 C)  TempSrc: Oral Oral  Oral  Resp: 16     Height:      Weight:      SpO2: 97% 96%  96%    General: Adult male, no acute distress HEENT: Normocephalic atraumatic, left eye with eversion of the lower lid, some stringy discharge, and mild injection. Cardiovascular: Regular rate and rhythm, 2/6 vibratory murmur at the right upper sternal border and also heard at the apex Respiratory: Clear to auscultation bilaterally Abdomen: Normal active bowel sounds, soft, nondistended, nontender MSK: No lower extremity edema  Discharge Instructions      Discharge Orders   Future Appointments Provider Department Dept Phone   03/05/2014 2:30 PM Biagio Borg, MD [place] [phone]   Future Orders Complete By Expires   Call MD for:  difficulty breathing, headache or visual disturbances  As directed    Call MD for:  extreme fatigue  As directed    Call MD for:  hives  As directed    Call MD for:  persistant dizziness or light-headedness  As directed    Call MD for:  persistant nausea and vomiting  As directed    Call MD for:  severe uncontrolled pain  As directed    Call MD for:  temperature >100.4  As directed    Diet - low sodium heart healthy  As directed    Increase activity slowly  As directed        Medication List    STOP taking these medications        levofloxacin 250 MG tablet  Commonly known as:  LEVAQUIN      TAKE these medications       albuterol 108 (90 BASE) MCG/ACT inhaler  Commonly known as:  PROVENTIL HFA;VENTOLIN HFA  Inhale 2 puffs into the lungs every 6 (six) hours as needed for wheezing or shortness of breath.     bisacodyl 5 MG EC tablet  Commonly known as:  DULCOLAX  Take 1 tablet (5 mg total) by mouth daily as needed for moderate constipation.     cholecalciferol 1000 UNITS tablet  Commonly known as:  VITAMIN D  Take 1,000 Units by mouth at bedtime.     clopidogrel 75 MG tablet  Commonly known as:  PLAVIX  Take 75 mg by mouth daily.     diclofenac sodium 1 % Gel  Commonly known as:  VOLTAREN  Apply 2 g topically 4 (four) times daily.     DSS 100 MG Caps  Take 100 mg by mouth 2 (two) times daily.     famotidine  20 MG tablet  Commonly known as:  PEPCID  Take 1 tablet (20 mg total) by mouth 2 (two) times daily.     folic acid 1 MG tablet  Commonly known as:  FOLVITE  Take 1 mg by mouth daily.     GERITOL COMPLETE PO  Take 1 tablet by mouth daily.     hyoscyamine 0.125 MG tablet  Commonly known as:  LEVSIN, ANASPAZ  Take 0.125 mg by mouth 4 (four) times daily as needed. Gas pain.     levothyroxine 137 MCG tablet  Commonly known as:  SYNTHROID, LEVOTHROID  Take 137 mcg by mouth daily.     meclizine 25 MG tablet  Commonly known as:  ANTIVERT  Take 1 tablet (25 mg total) by mouth 3 (three) times daily as needed for dizziness.     methotrexate 2.5 MG tablet  Commonly known as:  RHEUMATREX  Take 10 mg by mouth once a week. Takes 4 tablets weekly on Wednesday.   Caution:Chemotherapy. Protect from light.     methylPREDNISolone 4 MG tablet  Commonly known as:  MEDROL  Take 6 mg by mouth daily.     midodrine 2.5 MG tablet  Commonly known as:  PROAMATINE  Take 2.5 mg by mouth 3 (three) times daily. Take one tablet at 7 am, 2pm and at 4pm     mirabegron ER 25 MG Tb24 tablet  Commonly known as:   MYRBETRIQ  Take 25 mg by mouth at bedtime.     neomycin-polymyxin b-dexamethasone 3.5-10000-0.1 Susp  Commonly known as:  MAXITROL  Place 2 drops into both eyes every 6 (six) hours.     polyethylene glycol packet  Commonly known as:  MIRALAX / GLYCOLAX  Take 17 g by mouth daily as needed for moderate constipation.     pravastatin 40 MG tablet  Commonly known as:  PRAVACHOL  Take 40 mg by mouth daily.     QUEtiapine 50 MG tablet  Commonly known as:  SEROQUEL  Take 1 tablet (50 mg total) by mouth at bedtime.     risperiDONE 0.25 MG tablet  Commonly known as:  RISPERDAL  Take 0.25-0.5 mg by mouth 2 (two) times daily. 1 tab by mouth in the AM and 2 tab in the PM     sertraline 100 MG tablet  Commonly known as:  ZOLOFT  Take 100 mg by mouth at bedtime.     traMADol 50 MG tablet  Commonly known as:  ULTRAM  Take 1 tablet (50 mg total) by mouth every 6 (six) hours as needed.     vitamin C 500 MG tablet  Commonly known as:  ASCORBIC ACID  Take 500 mg by mouth at bedtime.       Follow-up Information   Follow up with Cathlean Cower, MD. Schedule an appointment as soon as possible for a visit in 2 weeks.   Specialties:  Internal Medicine, Radiology   Contact information:   [address] [place] [address] [phone]        The results of significant diagnostics from this hospitalization (including imaging, microbiology, ancillary and laboratory) are listed below for reference.    Significant Diagnostic Studies: Dg Chest 2 View  01/09/2014   CLINICAL DATA:  Recurrent weakness.  Falls with hallucination.  EXAM: CHEST  2 VIEW  COMPARISON:  DG CHEST 2 VIEW dated 11/03/2013; DG CHEST 1V PORT dated 12/25/2012  FINDINGS: Increased opacification over the right chest is present. Some of this  appears to be technical in some likely represents scarring. It is difficult to exclude airspace disease at the right lung base based on the technical limitations. No focal consolidation is  identified on the lateral view. Left basilar atelectasis is present. The cardiopericardial silhouette is within normal limits. Aortic arch shows calcifications. No pneumothorax. Question right lateral fifth rib fracture. Diffuse osteopenia. Thoracic vertebral body height appears preserved on the lateral view.  IMPRESSION: Right basilar opacity is favored to be a combination of technical factors and atelectasis. Unfortunately, pneumonia is difficult to exclude based on the opacity, even when compared to recent priors.  Possible right lateral fifth rib fracture, suboptimally visualized.   Electronically Signed   By: Dereck Ligas M.D.   On: 01/09/2014 15:59   Ct Head Wo Contrast  01/15/2014   CLINICAL DATA:  Hallucinations and weakness.  EXAM: CT HEAD WITHOUT CONTRAST  TECHNIQUE: Contiguous axial images were obtained from the base of the skull through the vertex without intravenous contrast.  COMPARISON:  CT of the head performed 11/03/2013  FINDINGS: There is no evidence of acute infarction, mass lesion, or intra- or extra-axial hemorrhage on CT.  Prominence of the ventricles and sulci reflects moderate cortical volume loss. Mild cerebellar atrophy is noted. Scattered periventricular white matter change likely reflects small vessel ischemic microangiopathy. A small chronic lacunar infarct is seen at the right globus pallidus.  The brainstem and fourth ventricle are within normal limits. The cerebral hemispheres demonstrate grossly normal gray-white differentiation. No mass effect or midline shift is seen.  There is no evidence of fracture; visualized osseous structures are unremarkable in appearance. The orbits are within normal limits. The paranasal sinuses and mastoid air cells are well-aerated. No significant soft tissue abnormalities are seen.  IMPRESSION: 1. No acute intracranial pathology seen on CT. 2. Moderate cortical volume loss and scattered small vessel ischemic microangiopathy. Small chronic lacunar  infarct at the right globus pallidus.   Electronically Signed   By: Garald Balding M.D.   On: 01/15/2014 21:41   Dg Chest Port 1 View  01/15/2014   CLINICAL DATA:  Weakness, hypertension  EXAM: PORTABLE CHEST - 1 VIEW  COMPARISON:  January 09, 2014  FINDINGS: The heart size and mediastinal contours are stable. The heart size is enlarged. The aorta is tortuous. There is consolidation of lateral left lung base. There is no pulmonary edema or pleural effusion. The visualized skeletal structures are stable.  IMPRESSION: Left lung base pneumonia.   Electronically Signed   By: Abelardo Diesel M.D.   On: 01/15/2014 19:53    Microbiology: Recent Results (from the past 240 hour(s))  CULTURE, BLOOD (ROUTINE X 2)     Status: None   Collection Time    01/16/14 12:35 AM      Result Value Range Status   Specimen Description BLOOD LEFT ANTECUBITAL   Final   Special Requests BOTTLES DRAWN AEROBIC AND ANAEROBIC 5CC   Final   Culture  Setup Time     Final   Value: 01/16/2014 03:57     Performed at [facility]   Culture     Final   Value:        BLOOD CULTURE RECEIVED NO GROWTH TO DATE CULTURE WILL BE HELD FOR 5 DAYS BEFORE ISSUING A FINAL NEGATIVE REPORT     Performed at [facility]   Report Status PENDING   Incomplete  CULTURE, BLOOD (ROUTINE X 2)     Status: None   Collection Time  01/16/14  1:03 AM      Result Value Range Status   Specimen Description BLOOD LEFT FOREARM   Final   Special Requests BOTTLES DRAWN AEROBIC AND ANAEROBIC 5CC   Final   Culture  Setup Time     Final   Value: 01/16/2014 03:57     Performed at [facility]   Culture     Final   Value:        BLOOD CULTURE RECEIVED NO GROWTH TO DATE CULTURE WILL BE HELD FOR 5 DAYS BEFORE ISSUING A FINAL NEGATIVE REPORT     Performed at [facility]   Report Status PENDING   Incomplete     Labs: Basic Metabolic Panel:  Recent Labs Lab 01/15/14 1940 01/17/14 0800 01/20/14 0435 01/21/14 0500  NA 136*  140 141 141  K 4.7 4.3 5.0 4.9  CL 101 104 107 107  CO2 24 26 25 26   GLUCOSE 102* 104* 92 98  BUN 33* 28* 27* 30*  CREATININE 1.65* 1.41* 1.45* 1.54*  CALCIUM 8.8 8.2* 8.0* 8.0*   Liver Function Tests: No results found for this basename: AST, ALT, ALKPHOS, BILITOT, PROT, ALBUMIN,  in the last 168 hours No results found for this basename: LIPASE, AMYLASE,  in the last 168 hours No results found for this basename: AMMONIA,  in the last 168 hours CBC:  Recent Labs Lab 01/15/14 1940 01/17/14 0800 01/21/14 0500  WBC 14.0* 9.9 10.7*  HGB 13.7 13.1 10.8*  HCT 41.2 38.7* 33.7*  MCV 105.9* 104.9* 108.0*  PLT 307 251 248   Cardiac Enzymes:  Recent Labs Lab 01/15/14 1940  TROPONINI <0.30   BNP: BNP (last 3 results)  Recent Labs  04/16/13 2327  PROBNP 1599.0*   CBG: No results found for this basename: GLUCAP,  in the last 168 hours  Time coordinating discharge: 45 minutes  Signed:  Dakai Braithwaite  [facility] 01/21/2014, 12:23 PM

## 2014-01-21 NOTE — Progress Notes (Addendum)
  [facility] Progress Note   Subjective: NO complaints. Standing BP this am was 125/65.  Last BP was lying 131/62  Filed Vitals:   01/20/14 2203 01/21/14 0508 01/21/14 0517 01/21/14 1100  BP: 163/66 149/66 125/65 131/62  Pulse: 69 65  60  Temp: 98.1 F (36.7 C) 97.7 F (36.5 C)  98 F (36.7 C)  TempSrc: Oral Oral  Oral  Resp: 16     Height:      Weight:      SpO2: 97% 96%  96%  Alert, no distress  No jvd  Chest clear bilat  RRR 2/6 SEM  Abd soft, nt, no ascites or HSM No LE or UE edema  Neuro is nf, ox3, pleasant  Assessment 1. Orthostatic hypotension, chronic  A. BP's during day while pt receiving midodrine should be measured standing / do not treat supine HTN during the day  B. Supine HTN in the evening should be treated as pt will by lying down at night while sleeping, e.g. hydralazine 25mg  po at 9-10 pm prn for supine SBP over 160-170  C. This is a chronic issue and will not go away, would recommend PCP monitor closely and be sure pt gets his midodrine  D. BP medications have been stopped.   2. CKD stage III  NO further suggestions, will sign off.   Kelly Splinter MD pager [phone]    cell [phone] 01/21/2014, 12:17 PM   Recent Labs Lab 01/17/14 0800 01/20/14 0435 01/21/14 0500  NA 140 141 141  K 4.3 5.0 4.9  CL 104 107 107  CO2 26 25 26   GLUCOSE 104* 92 98  BUN 28* 27* 30*  CREATININE 1.41* 1.45* 1.54*  CALCIUM 8.2* 8.0* 8.0*   No results found for this basename: AST, ALT, ALKPHOS, BILITOT, PROT, ALBUMIN,  in the last 168 hours  Recent Labs Lab 01/15/14 1940 01/17/14 0800 01/21/14 0500  WBC 14.0* 9.9 10.7*  HGB 13.7 13.1 10.8*  HCT 41.2 38.7* 33.7*  MCV 105.9* 104.9* 108.0*  PLT 307 251 248   . azithromycin  500 mg Oral Q24H  . cefTRIAXone (ROCEPHIN)  IV  1 g Intravenous Once  . clopidogrel  75 mg Oral Daily  . heparin  5,000 Units Subcutaneous Q8H  . levothyroxine  137 mcg Oral QAC breakfast  . methotrexate  10 mg Oral  Weekly  . methylPREDNISolone  6 mg Oral Daily  . midodrine  2.5 mg Oral BID WC  . neomycin-polymyxin b-dexamethasone  2 drop Both Eyes Q6H  . QUEtiapine  50 mg Oral QHS  . risperiDONE  0.25 mg Oral Daily  . risperiDONE  0.5 mg Oral QHS  . sertraline  100 mg Oral QHS  . simvastatin  20 mg Oral q1800     albuterol, bisacodyl, hydrALAZINE, meclizine, morphine injection, polyethylene glycol, traMADol, traZODone

## 2014-01-21 NOTE — Clinical Social Work Placement (Signed)
     Clinical Social Work Department CLINICAL SOCIAL WORK PLACEMENT NOTE 01/21/2014  Patient:  [name], [name]  Account Number:  [id number] Admit date:  01/15/2014  Clinical Social Worker:  Ulyess Blossom  Date/time:  01/19/2014 11:29 AM  Clinical Social Work is seeking post-discharge placement for this patient at the following level of care:   SKILLED NURSING   (*CSW will update this form in Epic as items are completed)   01/19/2014  Patient/family provided with [place] Department of Clinical Social Works list of facilities offering this level of care within the geographic area requested by the patient (or if unable, by the patients family).  01/19/2014  Patient/family informed of their freedom to choose among providers that offer the needed level of care, that participate in Medicare, Medicaid or managed care program needed by the patient, have an available bed and are willing to accept the patient.  01/19/2014  Patient/family informed of [facility] ownership interest in [facility], as well as of the fact that they are under no obligation to receive care at this facility.  PASARR submitted to EDS on 01/19/2014 PASARR number received from EDS on 01/19/2014  FL2 transmitted to all facilities in geographic area requested by pt/family on  01/19/2014 FL2 transmitted to all facilities within larger geographic area on   Patient informed that his/her managed care company has contracts with or will negotiate with  certain facilities, including the following:     Patient/family informed of bed offers received:  01/21/2014 Patient chooses bed at [facility] Physician recommends and patient chooses bed at    Patient to be transferred to [facility] on  01/21/2014 Patient to be transferred to facility by family  The following physician request were entered in Epic:   Additional Comments:

## 2014-01-21 NOTE — Progress Notes (Signed)
Physical Therapy Treatment Patient Details Name: [name] MRN: [id number] DOB: 1923/01/20 Today's Date: 01/21/2014 Time: 3536-1443 PT Time Calculation (min): 31 min  PT Assessment / Plan / Recommendation  History of Present Illness 80 y.o. male with complex medical history as below, including coronary artery disease, paroxysmal A. fib, diabetes, intermittent confusion with hallucinations, CKD, presents with a chief complaint of worsening hallucinations, worsening weakness at home at 3 falls in the last couple of days. He denies any fever or chills, denies any shortness of breath or cough, denies any chest pain, denies any abdominal pain nausea vomiting or diarrhea. He also has a history of orthostatic hypotension for which he was on Florinef in the past, now he is taking Midodrine for that. Family states that his hallucinations have been getting worse in the last couple of weeks. He does have insight into some hallucinations at times, but tries to interact with others. In the emergency room, blood work pertinent for leukocytosis of 14 and a chest x-ray suggestive of pneumonia.   PT Comments   Pt stated he was feeling better.  Assisted OOB to amb increased distance.  Amb in hallway twice then assisted to BR before positioning in recliner.    Follow Up Recommendations  SNF     Does the patient have the potential to tolerate intense rehabilitation     Barriers to Discharge        Equipment Recommendations  None recommended by PT    Recommendations for Other Services    Frequency Min 3X/week   Progress towards PT Goals Progress towards PT goals: Progressing toward goals  Plan      Precautions / Restrictions Precautions Precautions: Fall Restrictions Weight Bearing Restrictions: No    Pertinent Vitals/Pain no c/o pain   Mobility  Bed Mobility General bed mobility comments: Pt OOB in recliner Transfers Overall transfer level: Needs assistance Equipment used: Rolling walker (2  wheeled) Transfers: Sit to/from Stand Sit to Stand: Min assist;Min guard General transfer comment: verbal cues for safe technique, assist to rise and control descent Ambulation/Gait Ambulation/Gait assistance: Min guard;Min assist Ambulation Distance (Feet): 210 Feet (105' x 2 one sitting rest break) Assistive device: Rolling walker (2 wheeled) Gait Pattern/deviations: Step-through pattern;Trunk flexed Gait velocity: decreased General Gait Details: Pt stated he was feeling "better".  Able to tolerate increased amb distance and demon increased stability.  RA 96%     PT Goals (current goals can now be found in the care plan section)    Visit Information  Last PT Received On: 01/21/14 Assistance Needed: +1 History of Present Illness: 80 y.o. male with complex medical history as below, including coronary artery disease, paroxysmal A. fib, diabetes, intermittent confusion with hallucinations, CKD, presents with a chief complaint of worsening hallucinations, worsening weakness at home at 3 falls in the last couple of days. He denies any fever or chills, denies any shortness of breath or cough, denies any chest pain, denies any abdominal pain nausea vomiting or diarrhea. He also has a history of orthostatic hypotension for which he was on Florinef in the past, now he is taking Midodrine for that. Family states that his hallucinations have been getting worse in the last couple of weeks. He does have insight into some hallucinations at times, but tries to interact with others. In the emergency room, blood work pertinent for leukocytosis of 14 and a chest x-ray suggestive of pneumonia.    Subjective Data      Cognition  Balance     End of Session PT - End of Session Equipment Utilized During Treatment: Gait belt Activity Tolerance: Patient tolerated treatment well Patient left: in chair;with call bell/phone within reach   Rica Koyanagi  PTA [facility]  Acute  Rehab Pager      [phone]

## 2014-01-21 NOTE — Progress Notes (Addendum)
CSW spoke with patient's son, He is requesting [facility]. CSW explained [facility] is full and not one of the options that they were given. He states that he will call his sister and get back with CSW prior to lunch.  Lindaann Gradilla C. [place] MSW, [place] CSW received call from patient's son [facility]. They have decided on [facility] for SNF.  Daril Warga C. [place] MSW, [place] Patient cleared for discharge. Packet copied and placed in [place]. CSW met with patient. Patient is agreeable to snf today at whatever facility his children have chose. He is concerned that his ambulance ride to snf wouldn't be covered. CSW called patient's son. He was upset at the prospect that ptar wouldn't be covered. CSW explained that we can not guarantee payment. He states that they will pick patient up after they get off work and drive him.  Jenasia Dolinar C. [place] MSW, [place]

## 2014-01-22 LAB — CULTURE, BLOOD (ROUTINE X 2)
Culture: NO GROWTH
Culture: NO GROWTH

## 2014-01-26 ENCOUNTER — Non-Acute Institutional Stay (SKILLED_NURSING_FACILITY): Payer: Medicare Other | Admitting: Family

## 2014-01-26 DIAGNOSIS — N184 Chronic kidney disease, stage 4 (severe): Secondary | ICD-10-CM

## 2014-01-26 DIAGNOSIS — H109 Unspecified conjunctivitis: Secondary | ICD-10-CM

## 2014-01-26 DIAGNOSIS — D638 Anemia in other chronic diseases classified elsewhere: Secondary | ICD-10-CM

## 2014-01-26 NOTE — Progress Notes (Signed)
Patient ID: [name], male   DOB: 09-Feb-1923, 80 y.o.   MRN: [id number]  Date: 01/26/14 Facility: [facility]  Code Status:  DNR  Chief Complaint  Patient presents with  . Acute Visit    Irritated Eye    HPI: Pt presents with L eye induration and drainage. Pt reports having conjunctivitis several times a year and is seen by the ophthalmologist for recurrent infections.  Pt further endorses being treated in the hospital for conjunctivitis and completing course today. Pt denies blurred vision, trauma, visual disturbances, or irritation to L eye. Pt reports possibly scratching body and subsequently rubbing affected eye.  Pt reports associated mild headache that is relieved with rest and relaxation.  Nurse reports pt being treated with maxitrol 3.5 suspension eye drops q 6 hours. Nurse further endorses worsening of eye       Allergies  Allergen Reactions  . Bactrim [Sulfamethoxazole-Trimethoprim] Other (See Comments)    hallucinations  . Horse-Derived Products Hives     Medication List       This list is accurate as of: 01/26/14  1:52 PM.  Always use your most recent med list.               albuterol 108 (90 BASE) MCG/ACT inhaler  Commonly known as:  PROVENTIL HFA;VENTOLIN HFA  Inhale 2 puffs into the lungs every 6 (six) hours as needed for wheezing or shortness of breath.     bisacodyl 5 MG EC tablet  Commonly known as:  DULCOLAX  Take 1 tablet (5 mg total) by mouth daily as needed for moderate constipation.     cholecalciferol 1000 UNITS tablet  Commonly known as:  VITAMIN D  Take 1,000 Units by mouth at bedtime.     clopidogrel 75 MG tablet  Commonly known as:  PLAVIX  Take 75 mg by mouth daily.     diclofenac sodium 1 % Gel  Commonly known as:  VOLTAREN  Apply 2 g topically 4 (four) times daily.     DSS 100 MG Caps  Take 100 mg by mouth 2 (two) times daily.     famotidine 20 MG tablet  Commonly known as:  PEPCID  Take 1 tablet (20 mg total) by mouth  2 (two) times daily.     folic acid 1 MG tablet  Commonly known as:  FOLVITE  Take 1 mg by mouth daily.     GERITOL COMPLETE PO  Take 1 tablet by mouth daily.     hyoscyamine 0.125 MG tablet  Commonly known as:  LEVSIN, ANASPAZ  Take 0.125 mg by mouth 4 (four) times daily as needed. Gas pain.     levothyroxine 137 MCG tablet  Commonly known as:  SYNTHROID, LEVOTHROID  Take 137 mcg by mouth daily.     meclizine 25 MG tablet  Commonly known as:  ANTIVERT  Take 1 tablet (25 mg total) by mouth 3 (three) times daily as needed for dizziness.     methotrexate 2.5 MG tablet  Commonly known as:  RHEUMATREX  Take 10 mg by mouth once a week. Takes 4 tablets weekly on Wednesday.   Caution:Chemotherapy. Protect from light.     methylPREDNISolone 4 MG tablet  Commonly known as:  MEDROL  Take 6 mg by mouth daily.     midodrine 2.5 MG tablet  Commonly known as:  PROAMATINE  Take 2.5 mg by mouth 3 (three) times daily. Take one tablet at 7 am, 2pm and at 4pm  mirabegron ER 25 MG Tb24 tablet  Commonly known as:  MYRBETRIQ  Take 25 mg by mouth at bedtime.     neomycin-polymyxin b-dexamethasone 3.5-10000-0.1 Susp  Commonly known as:  MAXITROL  Place 2 drops into both eyes every 6 (six) hours.     polyethylene glycol packet  Commonly known as:  MIRALAX / GLYCOLAX  Take 17 g by mouth daily as needed for moderate constipation.     pravastatin 40 MG tablet  Commonly known as:  PRAVACHOL  Take 40 mg by mouth daily.     QUEtiapine 50 MG tablet  Commonly known as:  SEROQUEL  Take 1 tablet (50 mg total) by mouth at bedtime.     risperiDONE 0.25 MG tablet  Commonly known as:  RISPERDAL  Take 0.25-0.5 mg by mouth 2 (two) times daily. 1 tab by mouth in the AM and 2 tab in the PM     sertraline 100 MG tablet  Commonly known as:  ZOLOFT  Take 100 mg by mouth at bedtime.     traMADol 50 MG tablet  Commonly known as:  ULTRAM  Take 1 tablet (50 mg total) by mouth every 6 (six) hours as  needed.     vitamin C 500 MG tablet  Commonly known as:  ASCORBIC ACID  Take 500 mg by mouth at bedtime.         DATA REVIEWED    Laboratory Studies:Reviewed     Past Medical History  Diagnosis Date  . CAD (coronary artery disease)     s/p NSTEMI and BMS stent diagonal07/09;  Lexiscan Myoview 9/13:  EF 62%, no ischemia  . AS (aortic stenosis)     a.  Echo 2009 mean gradient 9mm HG. AVA 2.18;  b.  Echo 4/11 mild AS mean gradient 10mm HG;  c.  Echo 04/2011: Mild LVH, EF 55-60%, grade 1 diastolic dysfunction, mild aortic stenosis, mean gradient 14, mild LAE;  d. Echo 9/13: mod LVH, EF 50-55%, mild to mod AS, mean gradient 17 mmHg  . AF (paroxysmal atrial fibrillation)     Holter monitor 2.5 sec pauses  . Fatigue     CPX 11/09 VO2  14.4 (75% predicte)d, slope 35.  O2 pulse normal.  VO2 corrected for body weight 17.6.  . Hypothyroidism   . Hyperlipidemia   . History of prostate cancer   . Allergic rhinitis   . DM (diabetes mellitus)     type II diet controlled  . Shingles   . Left carotid stenosis   . Vitamin B 12 deficiency   . Anemia   . Osteoarthritis   . Nephrolithiasis   . Obesity   . OSA (obstructive sleep apnea)     AHI 15 PSG 09/06/08  . Hx of colonoscopy   . Stroke   . Anxiety and depression   . PMR (polymyalgia rheumatica) 05/29/2012  . Hypogonadism male 05/29/2012  . Hyperparathyroidism 05/29/2012  . TIA (transient ischemic attack) 05/29/2012  . CKD (chronic kidney disease) stage 4, GFR 15-29 ml/min 05/29/2012  . COPD (chronic obstructive pulmonary disease) 05/29/2012  . Myocardial infarction   . Cancer     prostate ca history   Past Surgical History  Procedure Laterality Date  . Appendectomy    . Hernia repair    . Right carpal tunnel release    . Lumbar spine surgery    . Cystectomy      neck  . Prostatectomy    . Vasectomy    . Total   knee arthroplasty      right  . Cholecystectomy      Review of Systems  Constitutional: Negative.   HENT: Negative.     Eyes:       Legally Blind in R eye  Respiratory: Negative.   Cardiovascular: Negative.   Neurological: Negative.   Psychiatric/Behavioral: Negative.      Physical Exam Filed Vitals:   01/26/14 1224  BP: 140/60  Pulse: 60  Temp: 97.4 F (36.3 C)  Resp: 18   There is no weight on file to calculate BMI. Physical Exam  Constitutional: He is oriented to person, place, and time.  Eyes: EOM are normal. Pupils are equal, round, and reactive to light. Left eye exhibits discharge. Left conjunctiva is injected. Left conjunctiva has a hemorrhage. Left eye exhibits normal extraocular motion and no nystagmus.  Cardiovascular: Normal rate and regular rhythm.   Pulmonary/Chest: Effort normal and breath sounds normal.  Neurological: He is alert and oriented to person, place, and time.  Psychiatric: He has a normal mood and affect. His behavior is normal. Judgment and thought content normal.    ASSESSMENT/PLAN  Conjunctivitis-Cipro opthalmic solution 2 drops q 2 hours to L eye over 12 hours x 2 days, then q 4 hours while awake x 5 days  Refer to ophthalmologist for recurrent eye infection CBC w/diff/ BMP next draw Hand Hygiene q shift Psych eval for hallucinations D/C Maxitrol eye drops per d/c rec Pt on Medrol and Methotrexate will obtain ESR/ RF on next draw Follow up:prn  Spent 1. 30 minutes   

## 2014-01-29 ENCOUNTER — Other Ambulatory Visit: Payer: Self-pay | Admitting: *Deleted

## 2014-01-29 ENCOUNTER — Non-Acute Institutional Stay (SKILLED_NURSING_FACILITY): Payer: Medicare Other | Admitting: Internal Medicine

## 2014-01-29 DIAGNOSIS — N184 Chronic kidney disease, stage 4 (severe): Secondary | ICD-10-CM

## 2014-01-29 DIAGNOSIS — M1990 Unspecified osteoarthritis, unspecified site: Secondary | ICD-10-CM

## 2014-01-29 DIAGNOSIS — D7289 Other specified disorders of white blood cells: Secondary | ICD-10-CM

## 2014-01-29 DIAGNOSIS — D7589 Other specified diseases of blood and blood-forming organs: Secondary | ICD-10-CM

## 2014-01-29 MED ORDER — TRAMADOL HCL 50 MG PO TABS: ORAL_TABLET | ORAL | Status: DC

## 2014-01-29 NOTE — Telephone Encounter (Signed)
[facility] 

## 2014-01-30 DIAGNOSIS — D7589 Other specified diseases of blood and blood-forming organs: Secondary | ICD-10-CM | POA: Insufficient documentation

## 2014-01-30 DIAGNOSIS — D7289 Other specified disorders of white blood cells: Secondary | ICD-10-CM | POA: Insufficient documentation

## 2014-01-30 NOTE — Progress Notes (Signed)
         PROGRESS NOTE  DATE: 01/29/2014  FACILITY:  [facility]  LEVEL OF CARE: SNF (31)  Acute Visit  CHIEF COMPLAINT:  Manage Osteoarthritis and chronic kidney disease stage IV  HISTORY OF PRESENT ILLNESS: I was requested by the staff to assess the patient regarding above problem(s):  CHRONIC KIDNEY DISEASE: The patient's chronic kidney disease remains stable.  Patient denies increasing lower extremity swelling or confusion. Last BUN and creatinine are: 30/1.58. In 1-15 BUN 30, creatinine 1.54.  ARTHRITIS: Patient's arthritis is unstable.  The patient complains of ongoing joint pains, but denies stiffness, swelling, warmth & redness.  No complications reported from the medication(s) currently being used.  PAST MEDICAL HISTORY : Reviewed.  No changes.  CURRENT MEDICATIONS: Reviewed per [facility]  REVIEW OF SYSTEMS:  GENERAL: no change in appetite, no fatigue, no weight changes, no fever, chills or weakness RESPIRATORY: no cough, SOB, DOE,, wheezing, hemoptysis CARDIAC: no chest pain, edema or palpitations GI: no abdominal pain, diarrhea, constipation, heart burn, nausea or vomiting  PHYSICAL EXAMINATION  GENERAL: no acute distress, moderately obese body habitus EYES: conjunctivae normal, sclerae normal, normal eye lids NECK: supple, trachea midline, no neck masses, no thyroid tenderness, no thyromegaly LYMPHATICS: no LAN in the neck, no supraclavicular LAN RESPIRATORY: breathing is even & unlabored, BS CTAB CARDIAC: RRR, no murmur,no extra heart sounds, no edema GI: abdomen soft, normal BS, no masses, no tenderness, no hepatomegaly, no splenomegaly PSYCHIATRIC: the patient is alert & oriented to person, affect & behavior appropriate  LABS/RADIOLOGY:  01-27-14 WBC 11.2, MCV 108 otherwise CBC normal  ASSESSMENT/PLAN:  Osteoarthritis-uncontrolled problem. Start tramadol 50 mg twice a day Chronic kidney disease stage IV-stable Macrocytosis-new problem. Check  RBC folate and vitamin B12 level. Leukocytoses-new problem. Patient asymptomatic. Recheck CBC with diff 02-02-14.  CPT CODE: 62694

## 2014-03-03 ENCOUNTER — Inpatient Hospital Stay (HOSPITAL_COMMUNITY)
Admission: EM | Admit: 2014-03-03 | Discharge: 2014-03-07 | DRG: 191 | Disposition: A | Discharge status: Home Health | Payer: Medicare Other | Attending: Internal Medicine | Admitting: Internal Medicine

## 2014-03-03 ENCOUNTER — Encounter (HOSPITAL_COMMUNITY): Payer: Self-pay | Admitting: Emergency Medicine

## 2014-03-03 ENCOUNTER — Emergency Department (HOSPITAL_COMMUNITY): Payer: Medicare Other

## 2014-03-03 DIAGNOSIS — E785 Hyperlipidemia, unspecified: Secondary | ICD-10-CM | POA: Diagnosis present

## 2014-03-03 DIAGNOSIS — I480 Paroxysmal atrial fibrillation: Secondary | ICD-10-CM

## 2014-03-03 DIAGNOSIS — Z87891 Personal history of nicotine dependence: Secondary | ICD-10-CM

## 2014-03-03 DIAGNOSIS — Z8249 Family history of ischemic heart disease and other diseases of the circulatory system: Secondary | ICD-10-CM

## 2014-03-03 DIAGNOSIS — Z8673 Personal history of transient ischemic attack (TIA), and cerebral infarction without residual deficits: Secondary | ICD-10-CM

## 2014-03-03 DIAGNOSIS — E119 Type 2 diabetes mellitus without complications: Secondary | ICD-10-CM

## 2014-03-03 DIAGNOSIS — N184 Chronic kidney disease, stage 4 (severe): Secondary | ICD-10-CM | POA: Diagnosis present

## 2014-03-03 DIAGNOSIS — I4891 Unspecified atrial fibrillation: Secondary | ICD-10-CM

## 2014-03-03 DIAGNOSIS — J069 Acute upper respiratory infection, unspecified: Secondary | ICD-10-CM | POA: Diagnosis present

## 2014-03-03 DIAGNOSIS — I252 Old myocardial infarction: Secondary | ICD-10-CM

## 2014-03-03 DIAGNOSIS — N318 Other neuromuscular dysfunction of bladder: Secondary | ICD-10-CM | POA: Diagnosis present

## 2014-03-03 DIAGNOSIS — E2749 Other adrenocortical insufficiency: Secondary | ICD-10-CM | POA: Diagnosis present

## 2014-03-03 DIAGNOSIS — J449 Chronic obstructive pulmonary disease, unspecified: Secondary | ICD-10-CM

## 2014-03-03 DIAGNOSIS — Z8546 Personal history of malignant neoplasm of prostate: Secondary | ICD-10-CM

## 2014-03-03 DIAGNOSIS — J189 Pneumonia, unspecified organism: Secondary | ICD-10-CM

## 2014-03-03 DIAGNOSIS — I951 Orthostatic hypotension: Secondary | ICD-10-CM | POA: Diagnosis present

## 2014-03-03 DIAGNOSIS — I129 Hypertensive chronic kidney disease with stage 1 through stage 4 chronic kidney disease, or unspecified chronic kidney disease: Secondary | ICD-10-CM | POA: Diagnosis present

## 2014-03-03 DIAGNOSIS — G4733 Obstructive sleep apnea (adult) (pediatric): Secondary | ICD-10-CM | POA: Diagnosis present

## 2014-03-03 DIAGNOSIS — E039 Hypothyroidism, unspecified: Secondary | ICD-10-CM | POA: Diagnosis present

## 2014-03-03 DIAGNOSIS — R079 Chest pain, unspecified: Secondary | ICD-10-CM | POA: Diagnosis present

## 2014-03-03 DIAGNOSIS — I2510 Atherosclerotic heart disease of native coronary artery without angina pectoris: Secondary | ICD-10-CM | POA: Diagnosis present

## 2014-03-03 DIAGNOSIS — Z96659 Presence of unspecified artificial knee joint: Secondary | ICD-10-CM

## 2014-03-03 DIAGNOSIS — I10 Essential (primary) hypertension: Secondary | ICD-10-CM

## 2014-03-03 DIAGNOSIS — E669 Obesity, unspecified: Secondary | ICD-10-CM | POA: Diagnosis present

## 2014-03-03 DIAGNOSIS — D510 Vitamin B12 deficiency anemia due to intrinsic factor deficiency: Secondary | ICD-10-CM | POA: Diagnosis present

## 2014-03-03 DIAGNOSIS — J441 Chronic obstructive pulmonary disease with (acute) exacerbation: Principal | ICD-10-CM | POA: Diagnosis present

## 2014-03-03 DIAGNOSIS — Z823 Family history of stroke: Secondary | ICD-10-CM

## 2014-03-03 DIAGNOSIS — M353 Polymyalgia rheumatica: Secondary | ICD-10-CM | POA: Diagnosis present

## 2014-03-03 DIAGNOSIS — J8410 Pulmonary fibrosis, unspecified: Secondary | ICD-10-CM | POA: Diagnosis present

## 2014-03-03 LAB — CBC WITH DIFFERENTIAL/PLATELET
Basophils Absolute: 0 10*3/uL (ref 0.0–0.1)
Basophils Relative: 0 % (ref 0–1)
EOS PCT: 0 % (ref 0–5)
Eosinophils Absolute: 0 10*3/uL (ref 0.0–0.7)
HEMATOCRIT: 36.1 % — AB (ref 39.0–52.0)
Hemoglobin: 12.3 g/dL — ABNORMAL LOW (ref 13.0–17.0)
LYMPHS PCT: 5 % — AB (ref 12–46)
Lymphs Abs: 0.5 10*3/uL — ABNORMAL LOW (ref 0.7–4.0)
MCH: 35.5 pg — ABNORMAL HIGH (ref 26.0–34.0)
MCHC: 34.1 g/dL (ref 30.0–36.0)
MCV: 104.3 fL — AB (ref 78.0–100.0)
MONO ABS: 0.9 10*3/uL (ref 0.1–1.0)
Monocytes Relative: 9 % (ref 3–12)
Neutro Abs: 8.6 10*3/uL — ABNORMAL HIGH (ref 1.7–7.7)
Neutrophils Relative %: 86 % — ABNORMAL HIGH (ref 43–77)
Platelets: 263 10*3/uL (ref 150–400)
RBC: 3.46 MIL/uL — ABNORMAL LOW (ref 4.22–5.81)
RDW: 14.3 % (ref 11.5–15.5)
WBC: 10 10*3/uL (ref 4.0–10.5)

## 2014-03-03 LAB — TROPONIN I
Troponin I: <0.3 ng/mL (ref <0.30)
Troponin I: <0.3 ng/mL (ref <0.30)

## 2014-03-03 LAB — BASIC METABOLIC PANEL
BUN: 29 mg/dL — ABNORMAL HIGH (ref 6–23)
CO2: 22 meq/L (ref 19–32)
Calcium: 8.5 mg/dL (ref 8.4–10.5)
Chloride: 102 mEq/L (ref 96–112)
Creatinine, Ser: 1.67 mg/dL — ABNORMAL HIGH (ref 0.50–1.35)
GFR, EST AFRICAN AMERICAN: 40 mL/min — AB (ref >90)
GFR, EST NON AFRICAN AMERICAN: 35 mL/min — AB (ref >90)
Glucose, Bld: 138 mg/dL — ABNORMAL HIGH (ref 70–99)
Potassium: 4.5 mEq/L (ref 3.7–5.3)
Sodium: 138 mEq/L (ref 137–147)

## 2014-03-03 LAB — GLUCOSE, CAPILLARY: GLUCOSE-CAPILLARY: 109 mg/dL — AB (ref 70–99)

## 2014-03-03 LAB — PRO B NATRIURETIC PEPTIDE: PRO B NATRI PEPTIDE: 1635 pg/mL — AB (ref 0–450)

## 2014-03-03 LAB — I-STAT CG4 LACTIC ACID, ED: LACTIC ACID, VENOUS: 1.61 mmol/L (ref 0.5–2.2)

## 2014-03-03 LAB — MRSA PCR SCREENING: MRSA BY PCR: NEGATIVE

## 2014-03-03 MED ORDER — ALBUTEROL SULFATE HFA 108 (90 BASE) MCG/ACT IN AERS: 2.0000 | INHALATION_SPRAY | Freq: Four times a day (QID) | RESPIRATORY_TRACT | Status: DC | PRN

## 2014-03-03 MED ORDER — HYDROMORPHONE HCL PF 1 MG/ML IJ SOLN: 0.5000 mg | INTRAMUSCULAR | Status: DC | PRN

## 2014-03-03 MED ORDER — CLOPIDOGREL BISULFATE 75 MG PO TABS
75.0000 mg | ORAL_TABLET | Freq: Every day | ORAL | Status: DC
  Administered 2014-03-03 – 2014-03-07 (×5): 75 mg via ORAL
  Filled 2014-03-03 (×5): qty 1

## 2014-03-03 MED ORDER — CYCLOBENZAPRINE HCL 10 MG PO TABS
10.0000 mg | ORAL_TABLET | Freq: Three times a day (TID) | ORAL | Status: DC | PRN
  Filled 2014-03-03: qty 1

## 2014-03-03 MED ORDER — ACETAMINOPHEN 325 MG PO TABS
650.0000 mg | ORAL_TABLET | Freq: Four times a day (QID) | ORAL | Status: DC | PRN
  Administered 2014-03-06: 650 mg via ORAL
  Filled 2014-03-03: qty 2

## 2014-03-03 MED ORDER — ONDANSETRON HCL 4 MG/2ML IJ SOLN: 4.0000 mg | Freq: Four times a day (QID) | INTRAMUSCULAR | Status: DC | PRN

## 2014-03-03 MED ORDER — FOLIC ACID 1 MG PO TABS
1.0000 mg | ORAL_TABLET | Freq: Every morning | ORAL | Status: DC
  Administered 2014-03-04 – 2014-03-07 (×4): 1 mg via ORAL
  Filled 2014-03-03 (×4): qty 1

## 2014-03-03 MED ORDER — METHOTREXATE 2.5 MG PO TABS
10.0000 mg | ORAL_TABLET | ORAL | Status: DC
  Administered 2014-03-04: 10 mg via ORAL
  Filled 2014-03-03: qty 4

## 2014-03-03 MED ORDER — DILTIAZEM LOAD VIA INFUSION
10.0000 mg | Freq: Once | INTRAVENOUS | Status: AC
  Administered 2014-03-03: 10 mg via INTRAVENOUS
  Filled 2014-03-03: qty 10

## 2014-03-03 MED ORDER — TRAMADOL HCL 50 MG PO TABS
50.0000 mg | ORAL_TABLET | Freq: Once | ORAL | Status: AC
  Administered 2014-03-03: 50 mg via ORAL
  Filled 2014-03-03: qty 1

## 2014-03-03 MED ORDER — ALUM & MAG HYDROXIDE-SIMETH 200-200-20 MG/5ML PO SUSP: 30.0000 mL | Freq: Four times a day (QID) | ORAL | Status: DC | PRN

## 2014-03-03 MED ORDER — RISPERIDONE 0.5 MG PO TABS
0.5000 mg | ORAL_TABLET | Freq: Two times a day (BID) | ORAL | Status: DC
  Administered 2014-03-03 – 2014-03-04 (×3): 1 mg via ORAL
  Administered 2014-03-05 – 2014-03-07 (×5): 0.5 mg via ORAL
  Filled 2014-03-03 (×9): qty 2

## 2014-03-03 MED ORDER — INSULIN ASPART 100 UNIT/ML ~~LOC~~ SOLN
0.0000 [IU] | Freq: Three times a day (TID) | SUBCUTANEOUS | Status: DC
  Administered 2014-03-04 – 2014-03-05 (×2): 1 [IU] via SUBCUTANEOUS
  Administered 2014-03-05: 2 [IU] via SUBCUTANEOUS
  Administered 2014-03-06 (×2): 1 [IU] via SUBCUTANEOUS

## 2014-03-03 MED ORDER — DARIFENACIN HYDROBROMIDE ER 7.5 MG PO TB24
7.5000 mg | ORAL_TABLET | Freq: Every day | ORAL | Status: DC
  Administered 2014-03-03 – 2014-03-04 (×2): 7.5 mg via ORAL
  Filled 2014-03-03 (×2): qty 1

## 2014-03-03 MED ORDER — SODIUM CHLORIDE 0.9 % IJ SOLN
3.0000 mL | Freq: Two times a day (BID) | INTRAMUSCULAR | Status: DC
  Administered 2014-03-03 – 2014-03-07 (×6): 3 mL via INTRAVENOUS

## 2014-03-03 MED ORDER — MIRABEGRON ER 25 MG PO TB24
25.0000 mg | ORAL_TABLET | Freq: Every day | ORAL | Status: DC
  Administered 2014-03-03 – 2014-03-06 (×4): 25 mg via ORAL
  Filled 2014-03-03 (×5): qty 1

## 2014-03-03 MED ORDER — ENOXAPARIN SODIUM 40 MG/0.4ML ~~LOC~~ SOLN
40.0000 mg | SUBCUTANEOUS | Status: DC
Start: 2014-03-03 — End: 2014-03-07
  Administered 2014-03-03 – 2014-03-06 (×4): 40 mg via SUBCUTANEOUS
  Filled 2014-03-03 (×5): qty 0.4

## 2014-03-03 MED ORDER — DILTIAZEM HCL 100 MG IV SOLR
5.0000 mg/h | INTRAVENOUS | Status: DC
  Administered 2014-03-03: 5 mg/h via INTRAVENOUS
  Filled 2014-03-03: qty 100

## 2014-03-03 MED ORDER — ACETAMINOPHEN 650 MG RE SUPP: 650.0000 mg | Freq: Four times a day (QID) | RECTAL | Status: DC | PRN

## 2014-03-03 MED ORDER — SODIUM CHLORIDE 0.9 % IV SOLN: 250.0000 mL | INTRAVENOUS | Status: DC | PRN

## 2014-03-03 MED ORDER — DILTIAZEM HCL 100 MG IV SOLR: 5.0000 mg/h | INTRAVENOUS | Status: DC

## 2014-03-03 MED ORDER — METHYLPREDNISOLONE 4 MG PO TABS
6.0000 mg | ORAL_TABLET | Freq: Every morning | ORAL | Status: DC
  Administered 2014-03-04 – 2014-03-07 (×4): 6 mg via ORAL
  Filled 2014-03-03 (×4): qty 1

## 2014-03-03 MED ORDER — MECLIZINE HCL 25 MG PO TABS
25.0000 mg | ORAL_TABLET | Freq: Three times a day (TID) | ORAL | Status: DC | PRN
  Filled 2014-03-03: qty 1

## 2014-03-03 MED ORDER — ASPIRIN 81 MG PO CHEW
324.0000 mg | CHEWABLE_TABLET | Freq: Once | ORAL | Status: AC
  Administered 2014-03-03: 324 mg via ORAL
  Filled 2014-03-03: qty 4

## 2014-03-03 MED ORDER — MIDODRINE HCL 2.5 MG PO TABS
2.5000 mg | ORAL_TABLET | ORAL | Status: DC
  Administered 2014-03-04 – 2014-03-07 (×10): 2.5 mg via ORAL
  Filled 2014-03-03 (×14): qty 1

## 2014-03-03 MED ORDER — LEVOTHYROXINE SODIUM 137 MCG PO TABS
137.0000 ug | ORAL_TABLET | Freq: Every day | ORAL | Status: DC
  Administered 2014-03-04 – 2014-03-07 (×4): 137 ug via ORAL
  Filled 2014-03-03 (×5): qty 1

## 2014-03-03 MED ORDER — BISACODYL 5 MG PO TBEC
5.0000 mg | DELAYED_RELEASE_TABLET | Freq: Every day | ORAL | Status: DC | PRN
  Filled 2014-03-03: qty 1

## 2014-03-03 MED ORDER — SERTRALINE HCL 100 MG PO TABS
100.0000 mg | ORAL_TABLET | Freq: Every day | ORAL | Status: DC
  Administered 2014-03-03 – 2014-03-06 (×4): 100 mg via ORAL
  Filled 2014-03-03 (×5): qty 1

## 2014-03-03 MED ORDER — FUROSEMIDE 20 MG PO TABS
20.0000 mg | ORAL_TABLET | Freq: Two times a day (BID) | ORAL | Status: DC
  Administered 2014-03-03 – 2014-03-04 (×2): 20 mg via ORAL
  Filled 2014-03-03 (×5): qty 1

## 2014-03-03 MED ORDER — OXYCODONE HCL 5 MG PO TABS
5.0000 mg | ORAL_TABLET | ORAL | Status: DC | PRN
  Administered 2014-03-05 – 2014-03-06 (×4): 5 mg via ORAL
  Filled 2014-03-03 (×4): qty 1

## 2014-03-03 MED ORDER — VITAMIN D3 25 MCG (1000 UNIT) PO TABS
1000.0000 [IU] | ORAL_TABLET | Freq: Every day | ORAL | Status: DC
  Administered 2014-03-03 – 2014-03-06 (×4): 1000 [IU] via ORAL
  Filled 2014-03-03 (×5): qty 1

## 2014-03-03 MED ORDER — POLYETHYLENE GLYCOL 3350 17 G PO PACK
17.0000 g | PACK | Freq: Every day | ORAL | Status: DC | PRN
  Filled 2014-03-03: qty 1

## 2014-03-03 MED ORDER — AZITHROMYCIN 500 MG PO TABS
500.0000 mg | ORAL_TABLET | ORAL | Status: DC
  Administered 2014-03-03: 500 mg via ORAL
  Filled 2014-03-03 (×2): qty 1

## 2014-03-03 MED ORDER — DESMOPRESSIN ACETATE 0.1 MG PO TABS
0.1000 mg | ORAL_TABLET | Freq: Every day | ORAL | Status: DC
  Administered 2014-03-03 – 2014-03-06 (×4): 0.1 mg via ORAL
  Filled 2014-03-03 (×5): qty 1

## 2014-03-03 MED ORDER — ZOLPIDEM TARTRATE 5 MG PO TABS
5.0000 mg | ORAL_TABLET | Freq: Once | ORAL | Status: AC
  Administered 2014-03-03: 5 mg via ORAL
  Filled 2014-03-03: qty 1

## 2014-03-03 MED ORDER — HYOSCYAMINE SULFATE 0.125 MG PO TABS
0.1250 mg | ORAL_TABLET | Freq: Four times a day (QID) | ORAL | Status: DC | PRN
  Filled 2014-03-03: qty 1

## 2014-03-03 MED ORDER — SODIUM CHLORIDE 0.9 % IJ SOLN: 3.0000 mL | INTRAMUSCULAR | Status: DC | PRN

## 2014-03-03 MED ORDER — CYANOCOBALAMIN 500 MCG PO TABS
500.0000 ug | ORAL_TABLET | Freq: Every morning | ORAL | Status: DC
  Administered 2014-03-04 – 2014-03-07 (×4): 500 ug via ORAL
  Filled 2014-03-03 (×4): qty 1

## 2014-03-03 MED ORDER — ONDANSETRON HCL 4 MG PO TABS: 4.0000 mg | ORAL_TABLET | Freq: Four times a day (QID) | ORAL | Status: DC | PRN

## 2014-03-03 MED ORDER — INSULIN ASPART 100 UNIT/ML ~~LOC~~ SOLN: 0.0000 [IU] | Freq: Every day | SUBCUTANEOUS | Status: DC

## 2014-03-03 MED ORDER — ATORVASTATIN CALCIUM 10 MG PO TABS
10.0000 mg | ORAL_TABLET | Freq: Every morning | ORAL | Status: DC
  Administered 2014-03-04 – 2014-03-07 (×4): 10 mg via ORAL
  Filled 2014-03-03 (×4): qty 1

## 2014-03-03 NOTE — ED Provider Notes (Signed)
CSN: [id number]     Arrival date & time 03/03/14  1603 History   First MD Initiated Contact with Patient 03/03/14 1623     Chief Complaint  Patient presents with  . Shortness of Breath  . Atrial Fibrillation     (Consider location/radiation/quality/duration/timing/severity/associated sxs/prior Treatment) HPI Comments: 80 year old male presents from urgent care with atrial fibrillation with RVR. The patient has had a sore throat since he woke up this morning and so he went to urgent care. He has had a history of atrial fibrillation once before was in the hospital about a month ago for pneumonia. He's also been having cough with some production of sputum over the last several days. Has not noticed any fevers or chills. Started having some chest pain while he is at the urgent care earlier today. It felt like a dull ache. The chest pain has resolved. He denies any current shortness of breath but states earlier in the day he felt short of breath.   Past Medical History  Diagnosis Date  . CAD (coronary artery disease)     s/p NSTEMI and BMS stent diagonal07/09;  Lexiscan Myoview 9/13:  EF 62%, no ischemia  . AS (aortic stenosis)     a.  Echo 2009 mean gradient 41mm HG. AVA 2.18;  b.  Echo 4/11 mild AS mean gradient 21mm HG;  c.  Echo 04/2011: Mild LVH, EF 0000000, grade 1 diastolic dysfunction, mild aortic stenosis, mean gradient 14, mild LAE;  d. Echo 9/13: mod LVH, EF 50-55%, mild to mod AS, mean gradient 17 mmHg  . AF (paroxysmal atrial fibrillation)     Holter monitor 2.5 sec pauses  . Fatigue     CPX 11/09 VO2  14.4 (75% predicte)d, slope 35.  O2 pulse normal.  VO2 corrected for body weight 17.6.  Marland Kitchen Hypothyroidism   . Hyperlipidemia   . History of prostate cancer   . Allergic rhinitis   . DM (diabetes mellitus)     type II diet controlled  . Shingles   . Left carotid stenosis   . Vitamin B 12 deficiency   . Anemia   . Osteoarthritis   . Nephrolithiasis   . Obesity   . OSA  (obstructive sleep apnea)     AHI 15 PSG 09/06/08  . Hx of colonoscopy   . Stroke   . Anxiety and depression   . PMR (polymyalgia rheumatica) 05/29/2012  . Hypogonadism male 05/29/2012  . Hyperparathyroidism 05/29/2012  . TIA (transient ischemic attack) 05/29/2012  . CKD (chronic kidney disease) stage 4, GFR 15-29 ml/min 05/29/2012  . COPD (chronic obstructive pulmonary disease) 05/29/2012  . Myocardial infarction   . Cancer     prostate ca history   Past Surgical History  Procedure Laterality Date  . Appendectomy    . Hernia repair    . Right carpal tunnel release    . Lumbar spine surgery    . Cystectomy      neck  . Prostatectomy    . Vasectomy    . Total knee arthroplasty      right  . Cholecystectomy     Family History  Problem Relation Age of Onset  . Stroke Mother 48  . Coronary artery disease      father, brother, sister  . Alcohol abuse Father 69   History  Substance Use Topics  . Smoking status: Former Smoker -- 0.50 packs/day for 25 years    Types: Cigarettes    Quit date: 12/25/1958  .  Smokeless tobacco: Former Systems developer    Types: Snuff, [name]    Quit date: 08/06/1970     Comment: quit in 1950s  . Alcohol Use: No    Review of Systems  Constitutional: Negative for fever.  HENT: Positive for sore throat.   Respiratory: Positive for cough and shortness of breath.   Cardiovascular: Positive for chest pain. Negative for leg swelling.  Gastrointestinal: Negative for vomiting and abdominal pain.  Neurological: Positive for headaches.  All other systems reviewed and are negative.      Allergies  Bactrim and Horse-derived products  Home Medications   Current Outpatient Rx  Name  Route  Sig  Dispense  Refill  . albuterol (PROVENTIL HFA;VENTOLIN HFA) 108 (90 BASE) MCG/ACT inhaler   Inhalation   Inhale 2 puffs into the lungs every 6 (six) hours as needed for wheezing or shortness of breath.   1 Inhaler   5   . Ascorbic Acid (VITAMIN C) 500 MG tablet   Oral    Take 500 mg by mouth at bedtime.          . bisacodyl (DULCOLAX) 5 MG EC tablet   Oral   Take 1 tablet (5 mg total) by mouth daily as needed for moderate constipation.   30 tablet   0   . cholecalciferol (VITAMIN D) 1000 UNITS tablet   Oral   Take 1,000 Units by mouth at bedtime.          . clopidogrel (PLAVIX) 75 MG tablet   Oral   Take 75 mg by mouth daily.         . diclofenac sodium (VOLTAREN) 1 % GEL   Topical   Apply 2 g topically 4 (four) times daily.   3 Tube   1   . docusate sodium 100 MG CAPS   Oral   Take 100 mg by mouth 2 (two) times daily.   10 capsule      . famotidine (PEPCID) 20 MG tablet   Oral   Take 1 tablet (20 mg total) by mouth 2 (two) times daily.   180 tablet   3   . folic acid (FOLVITE) 1 MG tablet   Oral   Take 1 mg by mouth daily.         . hyoscyamine (LEVSIN, ANASPAZ) 0.125 MG tablet   Oral   Take 0.125 mg by mouth 4 (four) times daily as needed. Gas pain.         . Iron-Vitamins (GERITOL COMPLETE PO)   Oral   Take 1 tablet by mouth daily.         Marland Kitchen levothyroxine (SYNTHROID, LEVOTHROID) 137 MCG tablet   Oral   Take 137 mcg by mouth daily.         . meclizine (ANTIVERT) 25 MG tablet   Oral   Take 1 tablet (25 mg total) by mouth 3 (three) times daily as needed for dizziness.   30 tablet   0   . methotrexate (RHEUMATREX) 2.5 MG tablet   Oral   Take 10 mg by mouth once a week. Takes 4 tablets weekly on Wednesday.   Caution:Chemotherapy. Protect from light.         . methylPREDNISolone (MEDROL) 4 MG tablet   Oral   Take 6 mg by mouth daily.          . midodrine (PROAMATINE) 2.5 MG tablet   Oral   Take 2.5 mg by mouth 3 (three) times daily. Take one  tablet at 7 am, 2pm and at 4pm         . mirabegron ER (MYRBETRIQ) 25 MG TB24 tablet   Oral   Take 25 mg by mouth at bedtime.         Marland Kitchen neomycin-polymyxin b-dexamethasone (MAXITROL) 3.5-10000-0.1 SUSP   Both Eyes   Place 2 drops into both eyes every 6  (six) hours.   5 mL   0   . polyethylene glycol (MIRALAX / GLYCOLAX) packet   Oral   Take 17 g by mouth daily as needed for moderate constipation.   14 each   0   . pravastatin (PRAVACHOL) 40 MG tablet   Oral   Take 40 mg by mouth daily.         . QUEtiapine (SEROQUEL) 50 MG tablet   Oral   Take 1 tablet (50 mg total) by mouth at bedtime.   60 tablet   11   . risperiDONE (RISPERDAL) 0.25 MG tablet   Oral   Take 0.25-0.5 mg by mouth 2 (two) times daily. 1 tab by mouth in the AM and 2 tab in the PM         . sertraline (ZOLOFT) 100 MG tablet   Oral   Take 100 mg by mouth at bedtime.         . traMADol (ULTRAM) 50 MG tablet      Take one tablet by mouth twice daily for pain   60 tablet   5    BP 113/84  Pulse 94  Temp(Src) 98.5 F (36.9 C) (Oral)  Resp 17  SpO2 94% Physical Exam  Nursing note and vitals reviewed. Constitutional: He is oriented to person, place, and time. He appears well-developed and well-nourished.  HENT:  Head: Normocephalic and atraumatic.  Right Ear: External ear normal.  Left Ear: External ear normal.  Nose: Nose normal.  Eyes: Right eye exhibits no discharge. Left eye exhibits no discharge.  Neck: Neck supple.  Cardiovascular: Normal heart sounds and intact distal pulses.  An irregularly irregular rhythm present. Tachycardia present.   Pulmonary/Chest: Effort normal. He has decreased breath sounds in the right lower field and the left lower field.  Abdominal: Soft. There is no tenderness.  Musculoskeletal: He exhibits edema (trace pedal edema).  Neurological: He is alert and oriented to person, place, and time.  Skin: Skin is warm and dry.    ED Course  Procedures (including critical care time) Labs Review Labs Reviewed  CBC WITH DIFFERENTIAL - Abnormal; Notable for the following:    RBC 3.46 (*)    Hemoglobin 12.3 (*)    HCT 36.1 (*)    MCV 104.3 (*)    MCH 35.5 (*)    Neutrophils Relative % 86 (*)    Neutro Abs 8.6 (*)     Lymphocytes Relative 5 (*)    Lymphs Abs 0.5 (*)    All other components within normal limits  BASIC METABOLIC PANEL - Abnormal; Notable for the following:    Glucose, Bld 138 (*)    BUN 29 (*)    Creatinine, Ser 1.67 (*)    GFR calc non Af Amer 35 (*)    GFR calc Af Amer 40 (*)    All other components within normal limits  PRO B NATRIURETIC PEPTIDE - Abnormal; Notable for the following:    Pro B Natriuretic peptide (BNP) 1635.0 (*)    All other components within normal limits  TROPONIN I  INFLUENZA PANEL BY PCR (  TYPE A & B, H1N1)  I-STAT CG4 LACTIC ACID, ED   Imaging Review Dg Chest 2 View  03/03/2014   CLINICAL DATA Dyspnea and cough with history of coronary artery disease COPD and diabetes  EXAM CHEST  2 VIEW  COMPARISON DG CHEST 1V PORT dated 01/15/2014; DG CHEST 2 VIEW dated 01/09/2014  FINDINGS The lungs are mildly hyperinflated. There is no focal infiltrate. There is stable mild shift of the mediastinum towards the right. There is minimal stable density along the lateral aspect of the left hemithorax consistent with fibrosis. Density in the right lateral costophrenic angle is slightly more conspicuous today. There is stable linear density projecting over the lower thoracic spine consistent with fibrosis. The cardiac silhouette is top-normal in size but stable. The pulmonary vascularity is not engorged. The mediastinum is normal in width. The observed portions of the bony thorax appear normal.  IMPRESSION 1. There are extensive chronic changes consistent with COPD and pulmonary fibrosis. Density at the left lung base is persistent and likely reflects scarring. 2. There is no evidence of CHF nor pneumonia. 3. A small amount of pleural fluid at the right lung base is suspected. This is not greatly changed than on previous studies. 4. Chest CT scanning is recommended if the patient's symptoms warrant further evaluation.  SIGNATURE  Electronically Signed   By: David  Martinique   On: 03/03/2014  17:25     EKG Interpretation   Date/Time:  Tuesday March 03 2014 16:15:51 EDT Ventricular Rate:  135 PR Interval:    QRS Duration: 94 QT Interval:  352 QTC Calculation: 528 R Axis:   10 Text Interpretation:  Atrial fibrillation with RVR Low voltage, extremity  leads Borderline prolonged QT interval Confirmed by Tkai Large  MD, Chayse Zatarain  ([id number]) on 03/03/2014 4:26:24 PM      MDM   Final diagnoses:  Atrial fibrillation with RVR    Patient with Afib w RVR, no hypotension. Hx of Afib with last admission, does not appear to be on rate control at this time. Rate controlled with diltiazem, on drip of 5 mg/hr. No signs of PNA. Will admit to hospitalist.    Ephraim Hamburger, MD 03/04/14 [phone]

## 2014-03-03 NOTE — H&P (Signed)
[facility] History and Physical  [name] XTG:[id number] DOB: November 23, 1923 DOA: 03/03/2014  Referring physician:  EDP PCP: Cathlean Cower, MD  Specialists:   Chief Complaint:   A.fib  HPI: [name] is a 80 y.o. male with Multiple Medical problems who went to the [facility] in [place] to be seen for complaints of Productive Cough and Sore Throat and he was found to be in Atrial Fibrillation with RVR at rates of 130's to 140's.  He was referred to the ED and he was evaluated and placed on an IV Cardizem drip.  He also had complaints of chest pain which lasted 4 hours.   He was referred for medical admission.      Review of Systems:  Constitutional: No Weight Loss, No Weight Gain, Night Sweats, Fevers, Chills, Fatigue, or Generalized Weakness HEENT: No Headaches, Difficulty Swallowing,Tooth/Dental Problems,+Sore Throat,  No Sneezing, Rhinitis, Ear Ache, Nasal Congestion, or Post Nasal Drip,  Cardio-vascular:  +Chest pain, Orthopnea, PND, Edema in lower extremities, Anasarca, Dizziness, Palpitations  Resp: No Dyspnea, No DOE, +Productive Cough, No Non-Productive Cough, No Hemoptysis, No Change in Color of Mucus,  No Wheezing.    GI: No Heartburn, Indigestion, Abdominal Pain, Nausea, Vomiting, Diarrhea, Change in Bowel Habits,  Loss of Appetite  GU: No Dysuria, Change in Color of Urine, No Urgency or Frequency.  No flank pain.  Musculoskeletal: No Joint Pain or Swelling.  No Decreased Range of Motion. No Back Pain.  Neurologic: No Syncope, No Seizures, Muscle Weakness, Paresthesia, Vision Disturbance or Loss, No Diplopia, No Vertigo, No Difficulty Walking,  Skin: No Rash or Lesions. Psych: No Change in Mood or Affect. No Depression or Anxiety. No Memory loss. No Confusion or Hallucinations   Past Medical History  Diagnosis Date  . CAD (coronary artery disease)     s/p NSTEMI and BMS stent diagonal07/09;  Lexiscan Myoview 9/13:  EF 62%, no ischemia  . AS (aortic stenosis)     a.  Echo 2009 mean gradient 27mm HG. AVA 2.18;  b.  Echo 4/11 mild AS mean gradient 60mm HG;  c.  Echo 04/2011: Mild LVH, EF 27-03%, grade 1 diastolic dysfunction, mild aortic stenosis, mean gradient 14, mild LAE;  d. Echo 9/13: mod LVH, EF 50-55%, mild to mod AS, mean gradient 17 mmHg  . AF (paroxysmal atrial fibrillation)     Holter monitor 2.5 sec pauses  . Fatigue     CPX 11/09 VO2  14.4 (75% predicte)d, slope 35.  O2 pulse normal.  VO2 corrected for body weight 17.6.  Marland Kitchen Hypothyroidism   . Hyperlipidemia   . History of prostate cancer   . Allergic rhinitis   . DM (diabetes mellitus)     type II diet controlled  . Shingles   . Left carotid stenosis   . Vitamin B 12 deficiency   . Anemia   . Osteoarthritis   . Nephrolithiasis   . Obesity   . OSA (obstructive sleep apnea)     AHI 15 PSG 09/06/08  . Hx of colonoscopy   . Stroke   . Anxiety and depression   . PMR (polymyalgia rheumatica) 05/29/2012  . Hypogonadism male 05/29/2012  . Hyperparathyroidism 05/29/2012  . TIA (transient ischemic attack) 05/29/2012  . CKD (chronic kidney disease) stage 4, GFR 15-29 ml/min 05/29/2012  . COPD (chronic obstructive pulmonary disease) 05/29/2012  . Myocardial infarction   . Cancer     prostate ca history      Past Surgical History  Procedure  Laterality Date  . Appendectomy    . Hernia repair    . Right carpal tunnel release    . Lumbar spine surgery    . Cystectomy      neck  . Prostatectomy    . Vasectomy    . Total knee arthroplasty      right  . Cholecystectomy         Prior to Admission medications   Medication Sig Start Date End Date Taking? Authorizing Provider  albuterol (PROVENTIL HFA;VENTOLIN HFA) 108 (90 BASE) MCG/ACT inhaler Inhale 2 puffs into the lungs every 6 (six) hours as needed for wheezing or shortness of breath. 11/29/12  Yes Biagio Borg, MD  Ascorbic Acid (VITAMIN C) 500 MG tablet Take 500 mg by mouth at bedtime.    Yes Historical Provider, MD  atorvastatin  (LIPITOR) 10 MG tablet Take 10 mg by mouth every morning.   Yes Historical Provider, MD  bisacodyl (DULCOLAX) 5 MG EC tablet Take 1 tablet (5 mg total) by mouth daily as needed for moderate constipation. 01/21/14  Yes Janece Canterbury, MD  cholecalciferol (VITAMIN D) 1000 UNITS tablet Take 1,000 Units by mouth at bedtime.    Yes Historical Provider, MD  clopidogrel (PLAVIX) 75 MG tablet Take 75 mg by mouth every evening.    Yes Historical Provider, MD  cyanocobalamin 500 MCG tablet Take 500 mcg by mouth every morning.   Yes Historical Provider, MD  cyclobenzaprine (FLEXERIL) 10 MG tablet Take 10 mg by mouth 3 (three) times daily as needed for muscle spasms.   Yes Historical Provider, MD  desmopressin (DDAVP) 0.1 MG tablet Take 0.1 mg by mouth every evening.   Yes Historical Provider, MD  folic acid (FOLVITE) 1 MG tablet Take 1 mg by mouth every morning.    Yes Historical Provider, MD  furosemide (LASIX) 20 MG tablet Take 20 mg by mouth 2 (two) times daily.   Yes Historical Provider, MD  hyoscyamine (LEVSIN, ANASPAZ) 0.125 MG tablet Take 0.125 mg by mouth 4 (four) times daily as needed for cramping.    Yes Historical Provider, MD  Iron-Vitamins (GERITOL COMPLETE PO) Take 1 tablet by mouth every morning.    Yes Historical Provider, MD  levothyroxine (SYNTHROID, LEVOTHROID) 137 MCG tablet Take 137 mcg by mouth daily before breakfast.    Yes Historical Provider, MD  meclizine (ANTIVERT) 25 MG tablet Take 1 tablet (25 mg total) by mouth 3 (three) times daily as needed for dizziness. 08/08/13  Yes Zerita Boers, MD  methotrexate (RHEUMATREX) 2.5 MG tablet Take 10 mg by mouth once a week. Takes 4 tablets weekly on Wednesday.   Caution:Chemotherapy. Protect from light.   Yes Historical Provider, MD  methylPREDNISolone (MEDROL) 4 MG tablet Take 6 mg by mouth every morning.    Yes Historical Provider, MD  midodrine (PROAMATINE) 2.5 MG tablet Take 2.5 mg by mouth 3 (three) times daily. Take one tablet at  7 am, 2pm and at 4pm 09/23/13  Yes Minus Breeding, MD  mirabegron ER (MYRBETRIQ) 25 MG TB24 tablet Take 25 mg by mouth at bedtime.   Yes Historical Provider, MD  polyethylene glycol (MIRALAX / GLYCOLAX) packet Take 17 g by mouth daily as needed for moderate constipation. 01/21/14  Yes Janece Canterbury, MD  risperiDONE (RISPERDAL) 0.5 MG tablet Take 0.5-1 mg by mouth 2 (two) times daily. 1 tablet in the morning. 2 tablets at bedtime.   Yes Historical Provider, MD  sertraline (ZOLOFT) 100 MG tablet Take 100 mg  by mouth at bedtime.   Yes Historical Provider, MD  solifenacin (VESICARE) 5 MG tablet Take 5 mg by mouth every morning.   Yes Historical Provider, MD  traMADol (ULTRAM) 50 MG tablet Take one tablet by mouth twice daily for pain 01/29/14  Yes Pricilla Larsson, NP      Allergies  Allergen Reactions  . Bactrim [Sulfamethoxazole-Trimethoprim] Other (See Comments)    hallucinations  . Horse-Derived Products Hives     Social History:  reports that he quit smoking about 55 years ago. His smoking use included Cigarettes. He has a 12.5 pack-year smoking history. He quit smokeless tobacco use about 43 years ago. His smokeless tobacco use included Snuff and Chew. He reports that he does not drink alcohol or use illicit drugs.     Family History  Problem Relation Age of Onset  . Stroke Mother 3  . Coronary artery disease      father, brother, sister  . Alcohol abuse Father 24       Physical Exam:  GEN:  Pleasant Elderly Obese 80 y.o.  Caucasian male  examined  and in no acute distress; cooperative with exam Filed Vitals:   03/03/14 1706 03/03/14 1916 03/03/14 1923 03/03/14 1945  BP:   116/68 131/58  Pulse:  81 85 75  Temp: 99.8 F (37.7 C)     TempSrc: Oral     Resp:  16 16 18   SpO2:  99% 97% 96%   Blood pressure 131/58, pulse 75, temperature 99.8 F (37.7 C), temperature source Oral, resp. rate 18, SpO2 96.00%. PSYCH: He is alert and oriented x4; does not appear anxious does not  appear depressed; affect is normal HEENT: Normocephalic and Atraumatic, Mucous membranes pink; PERRLA; EOM intact; Fundi:  Benign;  No scleral icterus, Nares: Patent, Oropharynx: Clear, Neck:  FROM, no cervical lymphadenopathy nor thyromegaly or carotid bruit; no JVD; Breasts:: Not examined CHEST WALL: No tenderness CHEST: Normal respiration, clear to auscultation bilaterally HEART: Regular rate and rhythm; no murmurs rubs or gallops BACK: No kyphosis or scoliosis; no CVA tenderness ABDOMEN: Positive Bowel Sounds, Obese, soft non-tender; no masses, no organomegaly, no pannus; no intertriginous candida. Rectal Exam: Not done EXTREMITIES: No bone or joint deformity; age-appropriate arthropathy of the hands and knees; no cyanosis, clubbing or edema; no ulcerations. Genitalia: not examined PULSES: 2+ and symmetric SKIN: Normal hydration no rash or ulceration CNS:  A X O X 4 ,  CN intact,  No Focal Deficits.   Vascular: pulses palpable throughout    Labs on Admission:  Basic Metabolic Panel:  Recent Labs Lab 03/03/14 1647  NA 138  K 4.5  CL 102  CO2 22  GLUCOSE 138*  BUN 29*  CREATININE 1.67*  CALCIUM 8.5   Liver Function Tests: No results found for this basename: AST, ALT, ALKPHOS, BILITOT, PROT, ALBUMIN,  in the last 168 hours No results found for this basename: LIPASE, AMYLASE,  in the last 168 hours No results found for this basename: AMMONIA,  in the last 168 hours CBC:  Recent Labs Lab 03/03/14 1647  WBC 10.0  NEUTROABS 8.6*  HGB 12.3*  HCT 36.1*  MCV 104.3*  PLT 263   Cardiac Enzymes:  Recent Labs Lab 03/03/14 1647  TROPONINI <0.30    BNP (last 3 results)  Recent Labs  04/16/13 2327 03/03/14 1647  PROBNP 1599.0* 1635.0*   CBG: No results found for this basename: GLUCAP,  in the last 168 hours  Radiological Exams on Admission: Dg Chest  2 View  03/03/2014   CLINICAL DATA Dyspnea and cough with history of coronary artery disease COPD and diabetes   EXAM CHEST  2 VIEW  COMPARISON DG CHEST 1V PORT dated 01/15/2014; DG CHEST 2 VIEW dated 01/09/2014  FINDINGS The lungs are mildly hyperinflated. There is no focal infiltrate. There is stable mild shift of the mediastinum towards the right. There is minimal stable density along the lateral aspect of the left hemithorax consistent with fibrosis. Density in the right lateral costophrenic angle is slightly more conspicuous today. There is stable linear density projecting over the lower thoracic spine consistent with fibrosis. The cardiac silhouette is top-normal in size but stable. The pulmonary vascularity is not engorged. The mediastinum is normal in width. The observed portions of the bony thorax appear normal.  IMPRESSION 1. There are extensive chronic changes consistent with COPD and pulmonary fibrosis. Density at the left lung base is persistent and likely reflects scarring. 2. There is no evidence of CHF nor pneumonia. 3. A small amount of pleural fluid at the right lung base is suspected. This is not greatly changed than on previous studies. 4. Chest CT scanning is recommended if the patient's symptoms warrant further evaluation.  SIGNATURE  Electronically Signed   By: David  Martinique   On: 03/03/2014 17:25      EKG: Independently reviewed.     Assessment/Plan:   80 y.o. male with  Principal Problem:   Atrial fibrillation with RVR Active Problems:   Chest pain   HYPOTHYROIDISM   HYPERLIPIDEMIA   HYPERTENSION, BENIGN   CORONARY ARTERY DISEASE   PMR (polymyalgia rheumatica)   CKD (chronic kidney disease) stage 4, GFR 15-29 ml/min    Adrenal Insufficiency   Overactive BLadder     1.   Atrial Fibrillation with RVR-   Admit to SDU bed and placed on an IV Cardizem drip.     2.   Chest Pain/CAD hx-   Cycle Troponins. Nitropaste, O2, ASA continue Plavix and Atorvastatin Rx.    3.   Hypothyroid- continue Levothyroxine, check TSH level.    4.   HTN- continue Furosemide, monitor BPS.    5.    Hyperlipidemia- continue Atorvastatin Rx.    6.   PMR- continue Metheotrexate , and Prednisone Rx.     7.   Adrenal Insufficiency-   8.   Overactive Bladder- continue Mirabegron and Vesicare, Condom cath.    9.   Pernicious Anemia-  Cont B12 daily.    11.  CKD stage IV-  monitor BUN/Cr.    12.  DVT Prophylaxis with Lovenox.     13.  URI-   Azithromycin PO.    14. COPD- stable, continue Rescue Inhaler QID PRN.    15.  DM2-  Diet Controlled,  Check HbA1c, and SSI coverage PRN.         Code Status:  FULL CODE Family Communication:  Wife at Bedside   Disposition Plan:   inpatient  Time spent:  [place] [facility] Pager [phone]  If 7PM-7AM, please contact night-coverage [email or web address] Password [email or web address] 03/03/2014, 8:07 PM

## 2014-03-03 NOTE — ED Notes (Signed)
Per EMS: Pt went to [facility] in [place] for SOB, productive cough x 1.5 weeks. They states pt is in A-fib RVR, known hx of afib,denies chest pain, n/v, diaphoresis. Sating 96%

## 2014-03-03 NOTE — Progress Notes (Signed)
Pt still c/o insomnia called midlevel awaiting call back.

## 2014-03-03 NOTE — Progress Notes (Signed)
Utilization Review completed.  Ruthene Methvin RN CM  

## 2014-03-04 ENCOUNTER — Inpatient Hospital Stay (HOSPITAL_COMMUNITY): Payer: Medicare Other

## 2014-03-04 ENCOUNTER — Ambulatory Visit: Payer: Medicare Other | Admitting: Internal Medicine

## 2014-03-04 DIAGNOSIS — J449 Chronic obstructive pulmonary disease, unspecified: Secondary | ICD-10-CM

## 2014-03-04 DIAGNOSIS — J189 Pneumonia, unspecified organism: Secondary | ICD-10-CM

## 2014-03-04 DIAGNOSIS — I4891 Unspecified atrial fibrillation: Secondary | ICD-10-CM

## 2014-03-04 DIAGNOSIS — E119 Type 2 diabetes mellitus without complications: Secondary | ICD-10-CM

## 2014-03-04 DIAGNOSIS — I10 Essential (primary) hypertension: Secondary | ICD-10-CM

## 2014-03-04 LAB — BASIC METABOLIC PANEL
BUN: 27 mg/dL — AB (ref 6–23)
CO2: 26 meq/L (ref 19–32)
Calcium: 8.1 mg/dL — ABNORMAL LOW (ref 8.4–10.5)
Chloride: 105 mEq/L (ref 96–112)
Creatinine, Ser: 1.47 mg/dL — ABNORMAL HIGH (ref 0.50–1.35)
GFR calc Af Amer: 47 mL/min — ABNORMAL LOW (ref >90)
GFR, EST NON AFRICAN AMERICAN: 40 mL/min — AB (ref >90)
GLUCOSE: 98 mg/dL (ref 70–99)
POTASSIUM: 4.1 meq/L (ref 3.7–5.3)
Sodium: 141 mEq/L (ref 137–147)

## 2014-03-04 LAB — CBC
HCT: 35.5 % — ABNORMAL LOW (ref 39.0–52.0)
Hemoglobin: 11.6 g/dL — ABNORMAL LOW (ref 13.0–17.0)
MCH: 34.3 pg — AB (ref 26.0–34.0)
MCHC: 32.7 g/dL (ref 30.0–36.0)
MCV: 105 fL — ABNORMAL HIGH (ref 78.0–100.0)
Platelets: 234 10*3/uL (ref 150–400)
RBC: 3.38 MIL/uL — ABNORMAL LOW (ref 4.22–5.81)
RDW: 14.5 % (ref 11.5–15.5)
WBC: 8.3 10*3/uL (ref 4.0–10.5)

## 2014-03-04 LAB — GLUCOSE, CAPILLARY
GLUCOSE-CAPILLARY: 141 mg/dL — AB (ref 70–99)
GLUCOSE-CAPILLARY: 127 mg/dL — AB (ref 70–99)
Glucose-Capillary: 87 mg/dL (ref 70–99)
Glucose-Capillary: 114 mg/dL — ABNORMAL HIGH (ref 70–99)

## 2014-03-04 LAB — TROPONIN I
Troponin I: <0.3 ng/mL (ref <0.30)
Troponin I: <0.3 ng/mL (ref <0.30)

## 2014-03-04 LAB — INFLUENZA PANEL BY PCR (TYPE A & B)
H1N1FLUPCR: NOT DETECTED
INFLBPCR: NEGATIVE
Influenza A By PCR: NEGATIVE

## 2014-03-04 LAB — HEMOGLOBIN A1C
Hgb A1c MFr Bld: 5.8 % — ABNORMAL HIGH (ref <5.7)
Mean Plasma Glucose: 120 mg/dL — ABNORMAL HIGH (ref <117)

## 2014-03-04 MED ORDER — LORAZEPAM 2 MG/ML IJ SOLN
0.5000 mg | Freq: Once | INTRAMUSCULAR | Status: AC
  Administered 2014-03-04: 0.5 mg via INTRAVENOUS

## 2014-03-04 MED ORDER — DILTIAZEM HCL 30 MG PO TABS
30.0000 mg | ORAL_TABLET | Freq: Two times a day (BID) | ORAL | Status: DC
  Administered 2014-03-04 – 2014-03-06 (×5): 30 mg via ORAL
  Filled 2014-03-04 (×7): qty 1

## 2014-03-04 MED ORDER — LEVOFLOXACIN IN D5W 750 MG/150ML IV SOLN
750.0000 mg | INTRAVENOUS | Status: DC
  Administered 2014-03-04 – 2014-03-06 (×2): 750 mg via INTRAVENOUS
  Filled 2014-03-04 (×2): qty 150

## 2014-03-04 MED ORDER — LORAZEPAM 2 MG/ML IJ SOLN
INTRAMUSCULAR | Status: AC
  Filled 2014-03-04: qty 1

## 2014-03-04 NOTE — Progress Notes (Signed)
ANTIBIOTIC CONSULT NOTE - INITIAL  Pharmacy Consult for Levaquin Indication: pneumonia  Allergies  Allergen Reactions  . Bactrim [Sulfamethoxazole-Trimethoprim] Other (See Comments)    hallucinations  . Horse-Derived Products Hives    Patient Measurements: Height: 5\' 8"  (172.7 cm) Weight: 208 lb 15.9 oz (94.8 kg) IBW/kg (Calculated) : 68.4  Vital Signs: Temp: 98.1 F (36.7 C) (03/11 0800) Temp src: Oral (03/11 0800) BP: 117/41 mmHg (03/11 0800) Pulse Rate: 69 (03/11 0800) Intake/Output from previous day: 03/10 0701 - 03/11 0700 In: 857.7 [P.O.:720; I.V.:137.7] Out: 1000 [Urine:1000] Intake/Output from this shift:    Labs:  Recent Labs  03/03/14 1647 03/04/14 0520  WBC 10.0 8.3  HGB 12.3* 11.6*  PLT 263 234  CREATININE 1.67* 1.47*   Estimated Creatinine Clearance: 38.1 ml/min (by C-G formula based on Cr of 1.47). No results found for this basename: VANCOTROUGH, Corlis Leak, VANCORANDOM, Brice, GENTPEAK, GENTRANDOM, TOBRATROUGH, TOBRAPEAK, TOBRARND, AMIKACINPEAK, AMIKACINTROU, AMIKACIN,  in the last 72 hours   Microbiology: Recent Results (from the past 720 hour(s))  MRSA PCR SCREENING     Status: None   Collection Time    03/03/14  8:37 PM      Result Value Ref Range Status   MRSA by PCR NEGATIVE  NEGATIVE Final   Comment:            The GeneXpert MRSA Assay (FDA     approved for NASAL specimens     only), is one component of a     comprehensive MRSA colonization     surveillance program. It is not     intended to diagnose MRSA     infection nor to guide or     monitor treatment for     MRSA infections.    Medical History: Past Medical History  Diagnosis Date  . CAD (coronary artery disease)     s/p NSTEMI and BMS stent diagonal07/09;  Lexiscan Myoview 9/13:  EF 62%, no ischemia  . AS (aortic stenosis)     a.  Echo 2009 mean gradient 60mm HG. AVA 2.18;  b.  Echo 4/11 mild AS mean gradient 11mm HG;  c.  Echo 04/2011: Mild LVH, EF 55-60%, grade 1  diastolic dysfunction, mild aortic stenosis, mean gradient 14, mild LAE;  d. Echo 9/13: mod LVH, EF 50-55%, mild to mod AS, mean gradient 17 mmHg  . AF (paroxysmal atrial fibrillation)     Holter monitor 2.5 sec pauses  . Fatigue     CPX 11/09 VO2  14.4 (75% predicte)d, slope 35.  O2 pulse normal.  VO2 corrected for body weight 17.6.  Marland Kitchen Hypothyroidism   . Hyperlipidemia   . History of prostate cancer   . Allergic rhinitis   . DM (diabetes mellitus)     type II diet controlled  . Shingles   . Left carotid stenosis   . Vitamin B 12 deficiency   . Anemia   . Osteoarthritis   . Nephrolithiasis   . Obesity   . OSA (obstructive sleep apnea)     AHI 15 PSG 09/06/08  . Hx of colonoscopy   . Stroke   . Anxiety and depression   . PMR (polymyalgia rheumatica) 05/29/2012  . Hypogonadism male 05/29/2012  . Hyperparathyroidism 05/29/2012  . TIA (transient ischemic attack) 05/29/2012  . CKD (chronic kidney disease) stage 4, GFR 15-29 ml/min 05/29/2012  . COPD (chronic obstructive pulmonary disease) 05/29/2012  . Myocardial infarction   . Cancer     prostate ca history  Medications:  Scheduled:  . atorvastatin  10 mg Oral q morning - 10a  . cholecalciferol  1,000 Units Oral QHS  . clopidogrel  75 mg Oral Daily  . cyanocobalamin  500 mcg Oral q morning - 10a  . desmopressin  0.1 mg Oral QHS  . diltiazem  30 mg Oral Q12H  . enoxaparin (LOVENOX) injection  40 mg Subcutaneous Q24H  . folic acid  1 mg Oral q morning - 10a  . insulin aspart  0-9 Units Subcutaneous TID WC  . levothyroxine  137 mcg Oral QAC breakfast  . methotrexate  10 mg Oral Weekly  . methylPREDNISolone  6 mg Oral q morning - 10a  . midodrine  2.5 mg Oral 3 times per day  . mirabegron ER  25 mg Oral QHS  . risperiDONE  0.5-1 mg Oral BID  . sertraline  100 mg Oral QHS  . sodium chloride  3 mL Intravenous Q12H   Infusions:   Assessment: 80 yo male with COPD presents with productive cough and sore throat. CXR shows extensive  chronic changes consistent with COPD and pulmonary fibrosis. Pharmacy consulted to dose Levaquin for likely developing PNA.   Goal of Therapy:  Eradication of infection Dose per renal function  Plan:   Levaquin 750mg  IV q48h for CrCl < 50 ml/min Follow up renal function & cultures Monitor QTc as quinolones can prolong  Peggyann Juba, PharmD, BCPS Pager: [phone] 03/04/2014,10:27 AM

## 2014-03-04 NOTE — Progress Notes (Signed)
md paged to inform cardizem drip turned off. Awaiting call back.

## 2014-03-04 NOTE — Progress Notes (Addendum)
[facility] PROGRESS NOTE  [name] NIO:[id number] DOB: 1923/12/20 DOA: 03/03/2014 PCP: Cathlean Cower, MD  Assessment/Plan: 80 y/o male with PMH of HTN, HPL, A FIB (not on AC due to bleeding, fall), h/o CVA, COPD presented in [place] to be seen for complaints of Productive Cough and Sore Throat and he was found to be in Atrial Fibrillation with RVR at rates of 130's to 140's. He was referred to the ED and he was evaluated and placed on an IV Cardizem drip.   1. COPD ? developing pneumonia; +cough; CXR: extensive chronic changes consistent with COPD and  pulmonary fibrosis. -start empiric IV atx, cont bronchodilators, oxygen; obtain CT chest for better evaluation;   2. PAF RVR; converted to NSR; off Cardizem IV -not on AV node block agent; start Cardizem PO; patient is not on AC due to bleeding, fall risk per cardiology notes  3. H/o DM; cont ISS: check HA1C  4. PMR- continue Metheotrexate , and Prednisone Rx  5. CAD no acute chest pain; cont home regimen  -clinically euvolemic; borderline BP; hold lasix today   6. Hypothyroid- continue Levothyroxine, check TSH level   Code Status: full Family Communication: d/w patient (indicate person spoken with, relationship, and if by phone, the number) Disposition Plan: home when medically stable    Consultants:  none  Procedures:  None   Antibiotics:  Levofloxacin 3/11>>>   (indicate start date, and stop date if known)  HPI/Subjective: alert  Objective: Filed Vitals:   03/04/14 0800  BP: 117/41  Pulse: 69  Temp: 98.1 F (36.7 C)  Resp: 17    Intake/Output Summary (Last 24 hours) at 03/04/14 0957 Last data filed at 03/04/14 0800  Gross per 24 hour  Intake 857.67 ml  Output   1000 ml  Net -142.33 ml   Filed Weights   03/03/14 2032 03/04/14 0400  Weight: 92.8 kg (204 lb 9.4 oz) 94.8 kg (208 lb 15.9 oz)    Exam:   General:  alert  Cardiovascular: s14,s2 rrr  Respiratory: few rales   Abdomen:  soft, nt,n d  Musculoskeletal: no LE edema   Data Reviewed: Basic Metabolic Panel:  Recent Labs Lab 03/03/14 1647 03/04/14 0520  NA 138 141  K 4.5 4.1  CL 102 105  CO2 22 26  GLUCOSE 138* 98  BUN 29* 27*  CREATININE 1.67* 1.47*  CALCIUM 8.5 8.1*   Liver Function Tests: No results found for this basename: AST, ALT, ALKPHOS, BILITOT, PROT, ALBUMIN,  in the last 168 hours No results found for this basename: LIPASE, AMYLASE,  in the last 168 hours No results found for this basename: AMMONIA,  in the last 168 hours CBC:  Recent Labs Lab 03/03/14 1647 03/04/14 0520  WBC 10.0 8.3  NEUTROABS 8.6*  --   HGB 12.3* 11.6*  HCT 36.1* 35.5*  MCV 104.3* 105.0*  PLT 263 234   Cardiac Enzymes:  Recent Labs Lab 03/03/14 1647 03/03/14 2250 03/04/14 0520  TROPONINI <0.30 <0.30 <0.30   BNP (last 3 results)  Recent Labs  04/16/13 2327 03/03/14 1647  PROBNP 1599.0* 1635.0*   CBG:  Recent Labs Lab 03/03/14 2125 03/04/14 0801  GLUCAP 109* 87    Recent Results (from the past 240 hour(s))  MRSA PCR SCREENING     Status: None   Collection Time    03/03/14  8:37 PM      Result Value Ref Range Status   MRSA by PCR NEGATIVE  NEGATIVE Final   Comment:  The GeneXpert MRSA Assay (FDA     approved for NASAL specimens     only), is one component of a     comprehensive MRSA colonization     surveillance program. It is not     intended to diagnose MRSA     infection nor to guide or     monitor treatment for     MRSA infections.     Studies: Dg Chest 2 View  03/03/2014   CLINICAL DATA Dyspnea and cough with history of coronary artery disease COPD and diabetes  EXAM CHEST  2 VIEW  COMPARISON DG CHEST 1V PORT dated 01/15/2014; DG CHEST 2 VIEW dated 01/09/2014  FINDINGS The lungs are mildly hyperinflated. There is no focal infiltrate. There is stable mild shift of the mediastinum towards the right. There is minimal stable density along the lateral aspect of the left  hemithorax consistent with fibrosis. Density in the right lateral costophrenic angle is slightly more conspicuous today. There is stable linear density projecting over the lower thoracic spine consistent with fibrosis. The cardiac silhouette is top-normal in size but stable. The pulmonary vascularity is not engorged. The mediastinum is normal in width. The observed portions of the bony thorax appear normal.  IMPRESSION 1. There are extensive chronic changes consistent with COPD and pulmonary fibrosis. Density at the left lung base is persistent and likely reflects scarring. 2. There is no evidence of CHF nor pneumonia. 3. A small amount of pleural fluid at the right lung base is suspected. This is not greatly changed than on previous studies. 4. Chest CT scanning is recommended if the patient's symptoms warrant further evaluation.  SIGNATURE  Electronically Signed   By: David  Martinique   On: 03/03/2014 17:25    Scheduled Meds: . atorvastatin  10 mg Oral q morning - 10a  . azithromycin  500 mg Oral Q24H  . cholecalciferol  1,000 Units Oral QHS  . clopidogrel  75 mg Oral Daily  . cyanocobalamin  500 mcg Oral q morning - 10a  . darifenacin  7.5 mg Oral Daily  . desmopressin  0.1 mg Oral QHS  . enoxaparin (LOVENOX) injection  40 mg Subcutaneous Q24H  . folic acid  1 mg Oral q morning - 10a  . furosemide  20 mg Oral BID  . insulin aspart  0-5 Units Subcutaneous QHS  . insulin aspart  0-9 Units Subcutaneous TID WC  . levothyroxine  137 mcg Oral QAC breakfast  . methotrexate  10 mg Oral Weekly  . methylPREDNISolone  6 mg Oral q morning - 10a  . midodrine  2.5 mg Oral 3 times per day  . mirabegron ER  25 mg Oral QHS  . risperiDONE  0.5-1 mg Oral BID  . sertraline  100 mg Oral QHS  . sodium chloride  3 mL Intravenous Q12H   Continuous Infusions: . diltiazem (CARDIZEM) infusion Stopped (03/04/14 0250)    Principal Problem:   Atrial fibrillation with RVR Active Problems:   HYPOTHYROIDISM    HYPERLIPIDEMIA   HYPERTENSION, BENIGN   CORONARY ARTERY DISEASE   PMR (polymyalgia rheumatica)   CKD (chronic kidney disease) stage 4, GFR 15-29 ml/min   Chest pain    Time spent: >35 minutes     Kinnie Feil  [facility] Pager [phone]. If 7PM-7AM, please contact night-coverage at [email or web address], password [facility] 03/04/2014, 9:57 AM  LOS: 1 day

## 2014-03-05 ENCOUNTER — Encounter (HOSPITAL_COMMUNITY): Payer: Self-pay

## 2014-03-05 ENCOUNTER — Ambulatory Visit: Payer: Medicare Other | Admitting: Internal Medicine

## 2014-03-05 LAB — CBC
HEMATOCRIT: 37 % — AB (ref 39.0–52.0)
Hemoglobin: 12 g/dL — ABNORMAL LOW (ref 13.0–17.0)
MCH: 34.2 pg — ABNORMAL HIGH (ref 26.0–34.0)
MCHC: 32.4 g/dL (ref 30.0–36.0)
MCV: 105.4 fL — ABNORMAL HIGH (ref 78.0–100.0)
PLATELETS: 247 10*3/uL (ref 150–400)
RBC: 3.51 MIL/uL — ABNORMAL LOW (ref 4.22–5.81)
RDW: 14.4 % (ref 11.5–15.5)
WBC: 8 10*3/uL (ref 4.0–10.5)

## 2014-03-05 LAB — BASIC METABOLIC PANEL
BUN: 28 mg/dL — AB (ref 6–23)
CHLORIDE: 100 meq/L (ref 96–112)
CO2: 26 mEq/L (ref 19–32)
Calcium: 8.6 mg/dL (ref 8.4–10.5)
Creatinine, Ser: 1.46 mg/dL — ABNORMAL HIGH (ref 0.50–1.35)
GFR calc non Af Amer: 41 mL/min — ABNORMAL LOW (ref >90)
GFR, EST AFRICAN AMERICAN: 47 mL/min — AB (ref >90)
Glucose, Bld: 130 mg/dL — ABNORMAL HIGH (ref 70–99)
POTASSIUM: 4.2 meq/L (ref 3.7–5.3)
Sodium: 138 mEq/L (ref 137–147)

## 2014-03-05 LAB — GLUCOSE, CAPILLARY
Glucose-Capillary: 100 mg/dL — ABNORMAL HIGH (ref 70–99)
Glucose-Capillary: 151 mg/dL — ABNORMAL HIGH (ref 70–99)
Glucose-Capillary: 139 mg/dL — ABNORMAL HIGH (ref 70–99)
Glucose-Capillary: 131 mg/dL — ABNORMAL HIGH (ref 70–99)

## 2014-03-05 LAB — TSH: TSH: 1.528 u[IU]/mL (ref 0.350–4.500)

## 2014-03-05 NOTE — Progress Notes (Signed)
[facility] PROGRESS NOTE  [name] [id number] DOB: [birth year] DOA: 03/03/2014 PCP: Cathlean Cower, MD  Assessment/Plan: 80 y/o male with PMH of HTN, HPL, A FIB (not on AC due to bleeding, fall), h/o CVA, COPD presented in [place] to be seen for complaints of Productive Cough and Sore Throat and he was found to be in Atrial Fibrillation with RVR at rates of 130's to 140's. He was referred to the ED and he was evaluated and placed on an IV Cardizem drip.    COPD ? developing pneumonia; +cough; CXR: extensive chronic changes consistent with COPD and  pulmonary fibrosis. -start empiric IV atx, cont bronchodilators, oxygen; obtain CT chest for better evaluation;  PAF RVR; converted to NSR; off Cardizem IV - not on AV node block agent; start Cardizem PO; patient is not on AC due to bleeding, fall risk per cardiology notes H/o DM; cont ISS: check HA1C PMR - continue Metheotrexate , and Prednisone Rx CAD no acute chest pain; cont home regimen  - clinically euvolemic; borderline BP; hold lasix today  Hypothyroid - continue Levothyroxine, check TSH level Orthostatic hypotension - he is on Midodrine, lats time hospitalized nephrology recommended to check BP while standing upright as his supine pressures remain elevated but he is in fact orthostatic. Continue Midodrine.    Code Status: full Family Communication: d/w patient (indicate person spoken with, relationship, and if by phone, the number) Disposition Plan: home when medically stable    Consultants:  none  Procedures:  None   Antibiotics:  Levofloxacin 3/11>>>  HPI/Subjective: alert  Objective: Filed Vitals:   03/05/14 0600  BP: 161/63  Pulse: 71  Temp:   Resp: 15    Intake/Output Summary (Last 24 hours) at 03/05/14 0650 Last data filed at 03/05/14 0639  Gross per 24 hour  Intake    870 ml  Output   1050 ml  Net   -180 ml   Filed Weights   03/03/14 2032 03/04/14 0400 03/05/14 0400  Weight: 92.8 kg  (204 lb 9.4 oz) 94.8 kg (208 lb 15.9 oz) 93.7 kg (206 lb 9.1 oz)    Exam:   General:  alert  Cardiovascular: s1,s2 rrr  Respiratory: few rales   Abdomen: soft, nt,n d  Musculoskeletal: no LE edema   Data Reviewed: Basic Metabolic Panel:  Recent Labs Lab 03/03/14 1647 03/04/14 0520 03/05/14 0320  NA 138 141 138  K 4.5 4.1 4.2  CL 102 105 100  CO2 22 26 26   GLUCOSE 138* 98 130*  BUN 29* 27* 28*  CREATININE 1.67* 1.47* 1.46*  CALCIUM 8.5 8.1* 8.6   CBC:  Recent Labs Lab 03/03/14 1647 03/04/14 0520 03/05/14 0320  WBC 10.0 8.3 8.0  NEUTROABS 8.6*  --   --   HGB 12.3* 11.6* 12.0*  HCT 36.1* 35.5* 37.0*  MCV 104.3* 105.0* 105.4*  PLT 263 234 247   Cardiac Enzymes:  Recent Labs Lab 03/03/14 1647 03/03/14 2250 03/04/14 0520 03/04/14 1112  TROPONINI <0.30 <0.30 <0.30 <0.30   BNP (last 3 results)  Recent Labs  04/16/13 2327 03/03/14 1647  PROBNP 1599.0* 1635.0*   CBG:  Recent Labs Lab 03/03/14 2125 03/04/14 0801 03/04/14 1138 03/04/14 1615 03/04/14 2112  GLUCAP 109* 87 141* 114* 127*    Recent Results (from the past 240 hour(s))  MRSA PCR SCREENING     Status: None   Collection Time    03/03/14  8:37 PM      Result Value Ref Range Status  MRSA by PCR NEGATIVE  NEGATIVE Final   Comment:            The GeneXpert MRSA Assay (FDA     approved for NASAL specimens     only), is one component of a     comprehensive MRSA colonization     surveillance program. It is not     intended to diagnose MRSA     infection nor to guide or     monitor treatment for     MRSA infections.     Studies: Dg Chest 2 View  03/03/2014   CLINICAL DATA Dyspnea and cough with history of coronary artery disease COPD and diabetes  EXAM CHEST  2 VIEW  COMPARISON DG CHEST 1V PORT dated 01/15/2014; DG CHEST 2 VIEW dated 01/09/2014  FINDINGS The lungs are mildly hyperinflated. There is no focal infiltrate. There is stable mild shift of the mediastinum towards the right.  There is minimal stable density along the lateral aspect of the left hemithorax consistent with fibrosis. Density in the right lateral costophrenic angle is slightly more conspicuous today. There is stable linear density projecting over the lower thoracic spine consistent with fibrosis. The cardiac silhouette is top-normal in size but stable. The pulmonary vascularity is not engorged. The mediastinum is normal in width. The observed portions of the bony thorax appear normal.  IMPRESSION 1. There are extensive chronic changes consistent with COPD and pulmonary fibrosis. Density at the left lung base is persistent and likely reflects scarring. 2. There is no evidence of CHF nor pneumonia. 3. A small amount of pleural fluid at the right lung base is suspected. This is not greatly changed than on previous studies. 4. Chest CT scanning is recommended if the patient's symptoms warrant further evaluation.  SIGNATURE  Electronically Signed   By: David  Martinique   On: 03/03/2014 17:25   Ct Chest Wo Contrast  03/04/2014   CLINICAL DATA Right pleural effusion on chest radiograph. COPD, fibrosis. Question pneumonia.  EXAM CT CHEST WITHOUT CONTRAST  TECHNIQUE Multidetector CT imaging of the chest was performed following the standard protocol without IV contrast.  COMPARISON DG CHEST 2 VIEW dated 03/03/2014; CT CHEST W/O CM dated 11/18/2012  FINDINGS No pathologically enlarged mediastinal or axillary lymph nodes. Hilar regions are difficult to definitively evaluate without IV contrast. Atherosclerotic calcification of the arterial vasculature, including extensive involvement of the coronary arteries. Heart is enlarged. No pericardial effusion.  A thin rind of pleural fluid is seen in the lower right hemi thorax with associated pleural thickening. Scattered scarring, volume loss and probable rounded atelectasis at the base of the right hemi thorax. Mild volume loss in the left lower lobe, increased from 11/18/2012, adjacent to an  elevated left hemidiaphragm. Airway is unremarkable.  Incidental imaging of the upper abdomen shows the visualized portion of the liver to be grossly unremarkable. Cholecystectomy. Slight thickening of the right adrenal gland, as before. Visualized portion of the left adrenal gland, spleen, pancreas, stomach and bowel are otherwise grossly unremarkable. No upper abdominal adenopathy. No worrisome lytic or sclerotic lesions. Degenerative changes are seen in the spine.  IMPRESSION 1. Small right fibrothorax with adjacent scarring and probable rounded atelectasis at the base of the right hemi thorax. 2. Coronary artery calcification. 3. Slightly increased volume loss in the left lower lobe, adjacent to an elevated left hemidiaphragm.  SIGNATURE  Electronically Signed   By: Lorin Picket M.D.   On: 03/04/2014 17:13    Scheduled Meds: . atorvastatin  10 mg Oral q morning - 10a  . cholecalciferol  1,000 Units Oral QHS  . clopidogrel  75 mg Oral Daily  . cyanocobalamin  500 mcg Oral q morning - 10a  . desmopressin  0.1 mg Oral QHS  . diltiazem  30 mg Oral Q12H  . enoxaparin (LOVENOX) injection  40 mg Subcutaneous Q24H  . folic acid  1 mg Oral q morning - 10a  . insulin aspart  0-9 Units Subcutaneous TID WC  . levofloxacin (LEVAQUIN) IV  750 mg Intravenous Q48H  . levothyroxine  137 mcg Oral QAC breakfast  . methotrexate  10 mg Oral Weekly  . methylPREDNISolone  6 mg Oral q morning - 10a  . midodrine  2.5 mg Oral 3 times per day  . mirabegron ER  25 mg Oral QHS  . risperiDONE  0.5-1 mg Oral BID  . sertraline  100 mg Oral QHS  . sodium chloride  3 mL Intravenous Q12H   Continuous Infusions:    Principal Problem:   Atrial fibrillation with RVR Active Problems:   HYPOTHYROIDISM   HYPERLIPIDEMIA   HYPERTENSION, BENIGN   CORONARY ARTERY DISEASE   PMR (polymyalgia rheumatica)   CKD (chronic kidney disease) stage 4, GFR 15-29 ml/min   Chest pain  Time spent: >35 minutes   Marzetta Board MD [facility] Pager [phone]. If 7PM-7AM, please contact night-coverage at [email or web address], password [facility] 03/05/2014, 6:50 AM  LOS: 2 days

## 2014-03-06 LAB — GLUCOSE, CAPILLARY
GLUCOSE-CAPILLARY: 122 mg/dL — AB (ref 70–99)
GLUCOSE-CAPILLARY: 123 mg/dL — AB (ref 70–99)
Glucose-Capillary: 97 mg/dL (ref 70–99)

## 2014-03-06 NOTE — Progress Notes (Signed)
Pt is sitting up in chair.  Helped him to bedside commode.  Gave bath, changed gown.  Vanetta Shawl SN

## 2014-03-06 NOTE — Progress Notes (Signed)
CSW met with patient re: discharge planning. Note PT recommended Home Health, possible SNF at discharge. Patient states that he has been to [name] in the past & most recently [name] (discharged from [name] to [name] on 01/21/14, he discharged home from there on 02/27/14) but would prefer to go back home at discharge.   Patient states that his wife is in late stage Alzheimers and they have a live-in caregiver (his second cousin), Pamala Hurry. He states he also has 2 supportive children, Vicky & Oswell.   CSW will continue to follow should he change his mind re: SNF.   [name], [facility] Clinical Social Worker cell #: [phone]

## 2014-03-06 NOTE — Evaluation (Signed)
Physical Therapy Evaluation Patient Details Name: [name] MRN: [id number] DOB: [birth year] Today's Date: 03/06/2014 Time: 1040-1055 PT Time Calculation (min): 15 min  PT Assessment / Plan / Recommendation History of Present Illness  80 year old male admitted for afib with RVR, ?COPD, and orthostatic hypotension  Clinical Impression  Pt currently with functional limitations due to the deficits listed below (see PT Problem List).  Pt will benefit from skilled PT to increase their independence and safety with mobility to allow discharge to the venue listed below.  Pt with limited mobility today due to fatigue and weakness.  Orthostatics below.  Recommend 24/7 assist at home and if not available would benefit from ST-SNF.     PT Assessment  Patient needs continued PT services    Follow Up Recommendations  Home health PT;Supervision for mobility/OOB    Does the patient have the potential to tolerate intense rehabilitation      Barriers to Discharge        Equipment Recommendations  None recommended by PT    Recommendations for Other Services     Frequency Min 3X/week    Precautions / Restrictions Precautions Precautions: Fall Precaution Comments: orthostatic Restrictions Weight Bearing Restrictions: No   Pertinent Vitals/Pain Orthostatic BPs  Supine 135/64 mmHg  Sitting 144/76 mmHg     Standing 92/52 mmHg           Mobility  Bed Mobility Overal bed mobility: Needs Assistance Bed Mobility: Supine to Sit Supine to sit: Max assist General bed mobility comments: increased assist required for trunk upright Transfers Overall transfer level: Needs assistance Equipment used: Rolling walker (2 wheeled) Transfers: Sit to/from [facility] Sit to Stand: Mod assist Stand pivot transfers: Min assist General transfer comment: assist for weakness, verbal cues for safe technique, pt reports dizziness upon standing however states that is his baseline,  orthostatic BP    Exercises     PT Diagnosis: Difficulty walking;Generalized weakness  PT Problem List: Decreased strength;Decreased activity tolerance;Decreased balance;Decreased mobility;Decreased safety awareness;Decreased knowledge of use of DME;Cardiopulmonary status limiting activity PT Treatment Interventions: DME instruction;Gait training;Functional mobility training;Therapeutic activities;Therapeutic exercise;Patient/family education;Neuromuscular re-education;Balance training     PT Goals(Current goals can be found in the care plan section) Acute Rehab PT Goals PT Goal Formulation: With patient Time For Goal Achievement: 03/20/14 Potential to Achieve Goals: Good  Visit Information  Last PT Received On: 03/06/14 Assistance Needed: +2 History of Present Illness: 80 year old male admitted for afib with RVR, ?COPD, and orthostatic hypotension       Prior [place] expects to be discharged to:: Private residence Living Arrangements: Spouse/significant other (spouse with Alzheimers) Available Help at Discharge: Available 24 hours/day;Personal care attendant Type of Home: House Home Access: Ramped entrance [place]: [place]: [place] - 2 wheels;Cane - single point Prior Function Level of Independence: Independent with assistive device(s);Needs assistance Gait / Transfers Assistance Needed: states household ambulator with RW or SPC ADL's / Homemaking Assistance Needed: reports assist required due to arthritis and fatiguing quickly Comments: hx of falls Communication Communication: HOH    Cognition  Cognition Arousal/Alertness: Lethargic    Extremity/Trunk Assessment Lower Extremity Assessment Lower Extremity Assessment: Generalized weakness   Balance    End of Session PT - End of Session Activity Tolerance: Patient limited by fatigue Patient left: in chair;with chair alarm set;with call bell/phone within reach Nurse  Communication: Mobility status  GP     Duchess Armendarez,KATHrine E 03/06/2014, 11:09 AM Carmelia Bake,  PT, DPT 03/06/2014 Pager: [phone]

## 2014-03-06 NOTE — Progress Notes (Signed)
Pharmacist Heart Failure Core Measure Documentation  Assessment: [name] has an EF documented as 50-55% in 2013 by ECHO.  Rationale: Heart failure patients with left ventricular systolic dysfunction (LVSD) and an EF < 40% should be prescribed an angiotensin converting enzyme inhibitor (ACEI) or angiotensin receptor blocker (ARB) at discharge unless a contraindication is documented in the medical record.  This patient is not currently on an ACEI or ARB for HF.  This note is being placed in the record in order to provide documentation that a contraindication to the use of these agents is present for this encounter.  ACE Inhibitor or Angiotensin Receptor Blocker is contraindicated (specify all that apply)  []   ACEI allergy AND ARB allergy []   Angioedema []   Moderate or severe aortic stenosis []   Hyperkalemia [x]   Hypotension, orthostatic hypotension []   Renal artery stenosis [x]   Worsening renal function, preexisting renal disease or dysfunction   [name] 03/06/2014 1:57 PM

## 2014-03-06 NOTE — Progress Notes (Signed)
Patient is laying in bed with eyes closed.  Sat him up so he could eat breakfast.  C/o back hurting with sitting, helped him lay back down.  Vanetta Shawl, SN

## 2014-03-06 NOTE — Progress Notes (Signed)
[facility] PROGRESS NOTE  [name] TIR:[id number] DOB: 04/24/1923 DOA: 03/03/2014 PCP: Cathlean Cower, MD  Assessment/Plan: 80 y/o male with PMH of HTN, HPL, A FIB (not on AC due to bleeding, fall), h/o CVA, COPD presented in [place] to be seen for complaints of Productive Cough and Sore Throat and he was found to be in Atrial Fibrillation with RVR at rates of 130's to 140's. He was referred to the ED and he was evaluated and placed on an IV Cardizem drip.    COPD ? developing pneumonia; +cough; CXR: extensive chronic changes consistent with COPD and  pulmonary fibrosis. - start empiric IV atx, cont bronchodilators, oxygen PAF RVR; converted to NSR; off Cardizem IV - not on AV node block agent; started Cardizem PO after he came off of the Cardizem drip. He is now orthostatic and hypotensive on oral diltiazem. He maintained sinus rhythm prior to this hospitalization, and I suspect that his A. fib was in the setting of an acute illness as per #1. I discontinued his diltiazem today and will monitor on telemetry over the next 24 hours. Patient is not on AC due to bleeding, fall risk per cardiology notes H/o DM; cont ISS: HA1C 5.8 PMR - continue Metheotrexate , and Prednisone Rx CAD no acute chest pain; cont home regimen  Hypothyroid - continue Levothyroxine, TSH 1.5 Orthostatic hypotension - he is on Midodrine, lats time hospitalized nephrology recommended to check BP while standing upright as his supine pressures remain elevated but he is in fact orthostatic and that was verified this morning. Continue Midodrine. Discontinue Cardizem as per #2  Code Status: full Family Communication: d/w patient Disposition Plan: home when medically stable   Consultants:  none  Procedures:  None   Antibiotics:  Levofloxacin 3/11>>>  HPI/Subjective: alert  Objective: Filed Vitals:   03/06/14 1042  BP: 92/52  Pulse: 75  Temp:   Resp:     Intake/Output Summary (Last 24 hours) at  03/06/14 1143 Last data filed at 03/06/14 0800  Gross per 24 hour  Intake    360 ml  Output    900 ml  Net   -540 ml   Filed Weights   03/03/14 2032 03/04/14 0400 03/05/14 0400  Weight: 92.8 kg (204 lb 9.4 oz) 94.8 kg (208 lb 15.9 oz) 93.7 kg (206 lb 9.1 oz)    Exam:   General:  alert  Cardiovascular: s1,s2 rrr  Respiratory: few rales   Abdomen: soft, nt,n d  Musculoskeletal: no LE edema   Data Reviewed: Basic Metabolic Panel:  Recent Labs Lab 03/03/14 1647 03/04/14 0520 03/05/14 0320  NA 138 141 138  K 4.5 4.1 4.2  CL 102 105 100  CO2 22 26 26   GLUCOSE 138* 98 130*  BUN 29* 27* 28*  CREATININE 1.67* 1.47* 1.46*  CALCIUM 8.5 8.1* 8.6   CBC:  Recent Labs Lab 03/03/14 1647 03/04/14 0520 03/05/14 0320  WBC 10.0 8.3 8.0  NEUTROABS 8.6*  --   --   HGB 12.3* 11.6* 12.0*  HCT 36.1* 35.5* 37.0*  MCV 104.3* 105.0* 105.4*  PLT 263 234 247   Cardiac Enzymes:  Recent Labs Lab 03/03/14 1647 03/03/14 2250 03/04/14 0520 03/04/14 1112  TROPONINI <0.30 <0.30 <0.30 <0.30   BNP (last 3 results)  Recent Labs  04/16/13 2327 03/03/14 1647  PROBNP 1599.0* 1635.0*   CBG:  Recent Labs Lab 03/05/14 0850 03/05/14 1204 03/05/14 1655 03/05/14 2140 03/06/14 0749  GLUCAP 100* 151* 139* 131* 97  Recent Results (from the past 240 hour(s))  MRSA PCR SCREENING     Status: None   Collection Time    03/03/14  8:37 PM      Result Value Ref Range Status   MRSA by PCR NEGATIVE  NEGATIVE Final   Comment:            The GeneXpert MRSA Assay (FDA     approved for NASAL specimens     only), is one component of a     comprehensive MRSA colonization     surveillance program. It is not     intended to diagnose MRSA     infection nor to guide or     monitor treatment for     MRSA infections.     Studies: Ct Chest Wo Contrast  03/04/2014   CLINICAL DATA Right pleural effusion on chest radiograph. COPD, fibrosis. Question pneumonia.  EXAM CT CHEST WITHOUT  CONTRAST  TECHNIQUE Multidetector CT imaging of the chest was performed following the standard protocol without IV contrast.  COMPARISON DG CHEST 2 VIEW dated 03/03/2014; CT CHEST W/O CM dated 11/18/2012  FINDINGS No pathologically enlarged mediastinal or axillary lymph nodes. Hilar regions are difficult to definitively evaluate without IV contrast. Atherosclerotic calcification of the arterial vasculature, including extensive involvement of the coronary arteries. Heart is enlarged. No pericardial effusion.  A thin rind of pleural fluid is seen in the lower right hemi thorax with associated pleural thickening. Scattered scarring, volume loss and probable rounded atelectasis at the base of the right hemi thorax. Mild volume loss in the left lower lobe, increased from 11/18/2012, adjacent to an elevated left hemidiaphragm. Airway is unremarkable.  Incidental imaging of the upper abdomen shows the visualized portion of the liver to be grossly unremarkable. Cholecystectomy. Slight thickening of the right adrenal gland, as before. Visualized portion of the left adrenal gland, spleen, pancreas, stomach and bowel are otherwise grossly unremarkable. No upper abdominal adenopathy. No worrisome lytic or sclerotic lesions. Degenerative changes are seen in the spine.  IMPRESSION 1. Small right fibrothorax with adjacent scarring and probable rounded atelectasis at the base of the right hemi thorax. 2. Coronary artery calcification. 3. Slightly increased volume loss in the left lower lobe, adjacent to an elevated left hemidiaphragm.  SIGNATURE  Electronically Signed   By: Leanna Battles M.D.   On: 03/04/2014 17:13    Scheduled Meds: . atorvastatin  10 mg Oral q morning - 10a  . cholecalciferol  1,000 Units Oral QHS  . clopidogrel  75 mg Oral Daily  . cyanocobalamin  500 mcg Oral q morning - 10a  . desmopressin  0.1 mg Oral QHS  . enoxaparin (LOVENOX) injection  40 mg Subcutaneous Q24H  . folic acid  1 mg Oral q morning  - 10a  . insulin aspart  0-9 Units Subcutaneous TID WC  . levofloxacin (LEVAQUIN) IV  750 mg Intravenous Q48H  . levothyroxine  137 mcg Oral QAC breakfast  . methotrexate  10 mg Oral Weekly  . methylPREDNISolone  6 mg Oral q morning - 10a  . midodrine  2.5 mg Oral 3 times per day  . mirabegron ER  25 mg Oral QHS  . risperiDONE  0.5-1 mg Oral BID  . sertraline  100 mg Oral QHS  . sodium chloride  3 mL Intravenous Q12H   Continuous Infusions:    Principal Problem:   Atrial fibrillation with RVR Active Problems:   HYPOTHYROIDISM   HYPERLIPIDEMIA   HYPERTENSION, BENIGN  CORONARY ARTERY DISEASE   PMR (polymyalgia rheumatica)   CKD (chronic kidney disease) stage 4, GFR 15-29 ml/min   Chest pain  Time spent: 25 minutes   Marzetta Board MD [facility] Pager [phone]. If 7PM-7AM, please contact night-coverage at [email or web address], password [facility] 03/06/2014, 11:43 AM  LOS: 3 days

## 2014-03-07 LAB — GLUCOSE, CAPILLARY
GLUCOSE-CAPILLARY: 152 mg/dL — AB (ref 70–99)
Glucose-Capillary: 106 mg/dL — ABNORMAL HIGH (ref 70–99)
Glucose-Capillary: 114 mg/dL — ABNORMAL HIGH (ref 70–99)

## 2014-03-07 MED ORDER — LEVOFLOXACIN 750 MG PO TABS: 750.0000 mg | ORAL_TABLET | ORAL | Status: DC

## 2014-03-07 NOTE — Progress Notes (Signed)
ANTIBIOTIC CONSULT NOTE - Follow Up  Pharmacy Consult for Levaquin Indication: pneumonia  Allergies  Allergen Reactions  . Bactrim [Sulfamethoxazole-Trimethoprim] Other (See Comments)    hallucinations  . Horse-Derived Products Hives    Patient Measurements: Height: 5\' 8"  (172.7 cm) Weight: 206 lb 9.1 oz (93.7 kg) IBW/kg (Calculated) : 68.4  Vital Signs: Temp: 97.6 F (36.4 C) (03/14 0646) Temp src: Oral (03/14 0646) BP: 167/70 mmHg (03/14 0721) Pulse Rate: 55 (03/14 0721) Intake/Output from previous day: 03/13 0701 - 03/14 0700 In: 750 [P.O.:600; IV Piggyback:150] Out: 1470 [Urine:1470] Intake/Output from this shift: Total I/O In: 240 [P.O.:240] Out: -   Labs:  Recent Labs  03/05/14 0320  WBC 8.0  HGB 12.0*  PLT 247  CREATININE 1.46*   Estimated Creatinine Clearance: 38.1 ml/min (by C-G formula based on Cr of 1.46). No results found for this basename: VANCOTROUGH, Corlis Leak, VANCORANDOM, [place], GENTPEAK, GENTRANDOM, TOBRATROUGH, TOBRAPEAK, TOBRARND, AMIKACINPEAK, AMIKACINTROU, AMIKACIN,  in the last 72 hours   Microbiology: Recent Results (from the past 720 hour(s))  MRSA PCR SCREENING     Status: None   Collection Time    03/03/14  8:37 PM      Result Value Ref Range Status   MRSA by PCR NEGATIVE  NEGATIVE Final   Comment:            The GeneXpert MRSA Assay (FDA     approved for NASAL specimens     only), is one component of a     comprehensive MRSA colonization     surveillance program. It is not     intended to diagnose MRSA     infection nor to guide or     monitor treatment for     MRSA infections.    Medical History: Past Medical History  Diagnosis Date  . CAD (coronary artery disease)     s/p NSTEMI and BMS stent diagonal07/09;  Lexiscan Myoview 9/13:  EF 62%, no ischemia  . AS (aortic stenosis)     a.  Echo 2009 mean gradient 63mm HG. AVA 2.18;  b.  Echo 4/11 mild AS mean gradient 7mm HG;  c.  Echo 04/2011: Mild LVH, EF 55-60%, grade  1 diastolic dysfunction, mild aortic stenosis, mean gradient 14, mild LAE;  d. Echo 9/13: mod LVH, EF 50-55%, mild to mod AS, mean gradient 17 mmHg  . AF (paroxysmal atrial fibrillation)     Holter monitor 2.5 sec pauses  . Fatigue     CPX 11/09 VO2  14.4 (75% predicte)d, slope 35.  O2 pulse normal.  VO2 corrected for body weight 17.6.  Marland Kitchen Hypothyroidism   . Hyperlipidemia   . History of prostate cancer   . Allergic rhinitis   . DM (diabetes mellitus)     type II diet controlled  . Shingles   . Left carotid stenosis   . Vitamin B 12 deficiency   . Anemia   . Osteoarthritis   . Nephrolithiasis   . Obesity   . OSA (obstructive sleep apnea)     AHI 15 PSG 09/06/08  . Hx of colonoscopy   . Stroke   . Anxiety and depression   . PMR (polymyalgia rheumatica) 05/29/2012  . Hypogonadism male 05/29/2012  . Hyperparathyroidism 05/29/2012  . TIA (transient ischemic attack) 05/29/2012  . CKD (chronic kidney disease) stage 4, GFR 15-29 ml/min 05/29/2012  . COPD (chronic obstructive pulmonary disease) 05/29/2012  . Myocardial infarction   . Cancer     prostate ca history  Medications:  Scheduled:  . atorvastatin  10 mg Oral q morning - 10a  . cholecalciferol  1,000 Units Oral QHS  . clopidogrel  75 mg Oral Daily  . cyanocobalamin  500 mcg Oral q morning - 10a  . desmopressin  0.1 mg Oral QHS  . enoxaparin (LOVENOX) injection  40 mg Subcutaneous Q24H  . folic acid  1 mg Oral q morning - 10a  . insulin aspart  0-9 Units Subcutaneous TID WC  . levofloxacin (LEVAQUIN) IV  750 mg Intravenous Q48H  . levothyroxine  137 mcg Oral QAC breakfast  . methotrexate  10 mg Oral Weekly  . methylPREDNISolone  6 mg Oral q morning - 10a  . midodrine  2.5 mg Oral 3 times per day  . mirabegron ER  25 mg Oral QHS  . risperiDONE  0.5-1 mg Oral BID  . sertraline  100 mg Oral QHS  . sodium chloride  3 mL Intravenous Q12H   Infusions:   Assessment: 80 yo male with COPD presents with productive cough and sore  throat. CXR shows extensive chronic changes consistent with COPD and pulmonary fibrosis. Pharmacy consulted to dose Levaquin for likely developing PNA.  Day #4 Levaquin 75 mg IV q48h. Tmax: afebrile WBC: wnl CKD: SCr 1.46 appears at baseline, CrCl 38CG  Goal of Therapy:  Eradication of infection Dose per renal function  Plan:   Continue Levaquin 750mg  IV q48h for CrCl < 50 ml/min Follow up renal function & cultures Monitor QTc as quinolones can prolong  Hershal Coria, PharmD, BCPS Pager: [phone] 03/07/2014 12:24 PM

## 2014-03-07 NOTE — Progress Notes (Signed)
   CARE MANAGEMENT NOTE 03/07/2014  Patient:  [name], [name]   Account Number:  [id number]  Date Initiated:  03/06/2014  Documentation initiated by:  Gabriel Earing  Subjective/Objective Assessment:   PT ADMITTED WITH AFIB FROM [place]     Action/Plan:   SNF   Anticipated DC Date:  03/09/2014   Anticipated DC Plan:  SKILLED NURSING FACILITY  In-house referral  Clinical Social Worker      DC Forensic scientist  CM consult      [facility] Choice  HOME HEALTH   Choice offered to / List presented to:  C-1 Patient        [place] arranged  [place]      [place].   Status of service:  Completed, signed off Medicare Important Message given?  NO (If response is "NO", the following Medicare IM given date fields will be blank) Date Medicare IM given:   Date Additional Medicare IM given:    Discharge Disposition:  [place]  Per UR Regulation:  Reviewed for med. necessity/level of care/duration of stay  If discussed at [place] of Stay Meetings, dates discussed:    Comments:  03/07/2014 1330 Offered choice for [facility] and agreeable to [facility]. No DME requested. Jonnie Finner RN CCM Case Mgmt phone [phone]  03/06/14 MMCGIBBONEY, RN, BSN CHART REVIEWED.

## 2014-03-07 NOTE — Progress Notes (Signed)
Pt discharged home with family, d/d instructions reviewed with pt and family. They verbalized understanding

## 2014-03-08 NOTE — Discharge Summary (Signed)
Physician Discharge Summary  [name] GXQ:[id number] DOB: 06-27-1923 DOA: 03/03/2014  PCP: Cathlean Cower, MD  Admit date: 03/03/2014 Discharge date: 03/08/2014  Time spent: 35 minutes  Recommendations for Outpatient Follow-up:  1. Followup with primary care provider in one to 2 weeks  2. Followup with cardiology in one to 2 weeks  Recommendations for primary care physician for things to follow:  Repeat BMP and CBC  Discharge Diagnoses:  Principal Problem:   Atrial fibrillation with RVR Active Problems:   HYPOTHYROIDISM   HYPERLIPIDEMIA   HYPERTENSION, BENIGN   CORONARY ARTERY DISEASE   PMR (polymyalgia rheumatica)   CKD (chronic kidney disease) stage 4, GFR 15-29 ml/min   Chest pain  Discharge Condition: stable  Diet recommendation: heart healthy  Filed Weights   03/03/14 2032 03/04/14 0400 03/05/14 0400  Weight: 92.8 kg (204 lb 9.4 oz) 94.8 kg (208 lb 15.9 oz) 93.7 kg (206 lb 9.1 oz)   History of present illness:  [name] is a 80 y.o. male with Multiple Medical problems who went to the [facility] in [place] to be seen for complaints of Productive Cough and Sore Throat and he was found to be in Atrial Fibrillation with RVR at rates of 130's to 140's. He was referred to the ED and he was evaluated and placed on an IV Cardizem drip. He also had complaints of chest pain which lasted 4 hours. He was referred for medical admission.   Hospital Course:  COPD with exacerbation, patient was started on empiric ceftriaxone and azithromycin with significant improvement in his respiratory status. His antibiotics were narrowed to levofloxacin and he is to complete a seven-day course as an outpatient. He was breathing comfortable on room air, and able to ambulate without any dyspnea, chest pain or distress. PAF RVR; converted to NSR right after IV diltiazem was started. He was placed on oral Cardizem, however he became somewhat hypotensive and orthostatic thus diltiazem had to  be discontinued. His atrial fibrillation was likely do to #1 and in the setting of an acute illness. He was monitored on telemetry after discontinuation and patient maintained sinus rhythm with heart rates into the upper 50s lower 60s. Patient is seeing Dr. Percival Spanish as an outpatient and is currently not anticoagulated due to to his orthostatic hypotension and fall risk.  PMR - continue Metheotrexate , and Prednisone Rx  CAD no acute chest pain; cont home regimen  Hypothyroid - continue Levothyroxine, TSH 1.5 Orthostatic hypotension - he is on Midodrine, lats time hospitalized nephrology recommended to check BP while standing upright as his supine pressures remain elevated but he is in fact orthostatic. Continue Midodrine.   Procedures:  None   Consultations:  None  Discharge Exam: Filed Vitals:   03/06/14 2238 03/07/14 0200 03/07/14 0646 03/07/14 0721  BP: 155/63 155/76 186/80 167/70  Pulse: 60 59 62 55  Temp:  98.3 F (36.8 C) 97.6 F (36.4 C)   TempSrc:  Oral Oral   Resp:  16 16   Height:      Weight:      SpO2:  97% 95%     General: No acute distress Cardiovascular: Regular rate and rhythm Respiratory: Clear to auscultation bilaterally  Discharge Instructions    Medication List         albuterol 108 (90 BASE) MCG/ACT inhaler  Commonly known as:  PROVENTIL HFA;VENTOLIN HFA  Inhale 2 puffs into the lungs every 6 (six) hours as needed for wheezing or shortness of breath.  atorvastatin 10 MG tablet  Commonly known as:  LIPITOR  Take 10 mg by mouth every morning.     bisacodyl 5 MG EC tablet  Commonly known as:  DULCOLAX  Take 1 tablet (5 mg total) by mouth daily as needed for moderate constipation.     cholecalciferol 1000 UNITS tablet  Commonly known as:  VITAMIN D  Take 1,000 Units by mouth at bedtime.     clopidogrel 75 MG tablet  Commonly known as:  PLAVIX  Take 75 mg by mouth every evening.     cyanocobalamin 500 MCG tablet  Take 500 mcg by mouth  every morning.     cyclobenzaprine 10 MG tablet  Commonly known as:  FLEXERIL  Take 10 mg by mouth 3 (three) times daily as needed for muscle spasms.     desmopressin 0.1 MG tablet  Commonly known as:  DDAVP  Take 0.1 mg by mouth every evening.     folic acid 1 MG tablet  Commonly known as:  FOLVITE  Take 1 mg by mouth every morning.     furosemide 20 MG tablet  Commonly known as:  LASIX  Take 20 mg by mouth 2 (two) times daily.     GERITOL COMPLETE PO  Take 1 tablet by mouth every morning.     hyoscyamine 0.125 MG tablet  Commonly known as:  LEVSIN, ANASPAZ  Take 0.125 mg by mouth 4 (four) times daily as needed for cramping.     levofloxacin 750 MG tablet  Commonly known as:  LEVAQUIN  Take 1 tablet (750 mg total) by mouth every other day.     levothyroxine 137 MCG tablet  Commonly known as:  SYNTHROID, LEVOTHROID  Take 137 mcg by mouth daily before breakfast.     meclizine 25 MG tablet  Commonly known as:  ANTIVERT  Take 1 tablet (25 mg total) by mouth 3 (three) times daily as needed for dizziness.     methotrexate 2.5 MG tablet  Commonly known as:  RHEUMATREX  Take 10 mg by mouth once a week. Takes 4 tablets weekly on Wednesday.   Caution:Chemotherapy. Protect from light.     methylPREDNISolone 4 MG tablet  Commonly known as:  MEDROL  Take 6 mg by mouth every morning.     midodrine 2.5 MG tablet  Commonly known as:  PROAMATINE  Take 2.5 mg by mouth 3 (three) times daily. Take one tablet at 7 am, 2pm and at 4pm     mirabegron ER 25 MG Tb24 tablet  Commonly known as:  MYRBETRIQ  Take 25 mg by mouth at bedtime.     polyethylene glycol packet  Commonly known as:  MIRALAX / GLYCOLAX  Take 17 g by mouth daily as needed for moderate constipation.     RISPERDAL 0.5 MG tablet  Generic drug:  risperiDONE  Take 0.5-1 mg by mouth 2 (two) times daily. 1 tablet in the morning. 2 tablets at bedtime.     sertraline 100 MG tablet  Commonly known as:  ZOLOFT  Take 100  mg by mouth at bedtime.     solifenacin 5 MG tablet  Commonly known as:  VESICARE  Take 5 mg by mouth every morning.     traMADol 50 MG tablet  Commonly known as:  ULTRAM  Take one tablet by mouth twice daily for pain     vitamin C 500 MG tablet  Commonly known as:  ASCORBIC ACID  Take 500 mg by mouth at bedtime.  Follow-up Information   Follow up with Cathlean Cower, MD. Schedule an appointment as soon as possible for a visit in 1 week.   Specialties:  Internal Medicine, Radiology   Contact information:   [place] [address] [phone]       Follow up with Minus Breeding, MD. Schedule an appointment as soon as possible for a visit in 2 weeks.   Specialty:  Cardiology   Contact information:   [id number] [address] [phone]       Follow up with [facility])    Contact information:   [address] [place] [address] [phone]       The results of significant diagnostics from this hospitalization (including imaging, microbiology, ancillary and laboratory) are listed below for reference.    Significant Diagnostic Studies: Dg Chest 2 View  03/03/2014   CLINICAL DATA Dyspnea and cough with history of coronary artery disease COPD and diabetes  EXAM CHEST  2 VIEW  COMPARISON DG CHEST 1V PORT dated 01/15/2014; DG CHEST 2 VIEW dated 01/09/2014  FINDINGS The lungs are mildly hyperinflated. There is no focal infiltrate. There is stable mild shift of the mediastinum towards the right. There is minimal stable density along the lateral aspect of the left hemithorax consistent with fibrosis. Density in the right lateral costophrenic angle is slightly more conspicuous today. There is stable linear density projecting over the lower thoracic spine consistent with fibrosis. The cardiac silhouette is top-normal in size but stable. The pulmonary vascularity is not engorged. The  mediastinum is normal in width. The observed portions of the bony thorax appear normal.  IMPRESSION 1. There are extensive chronic changes consistent with COPD and pulmonary fibrosis. Density at the left lung base is persistent and likely reflects scarring. 2. There is no evidence of CHF nor pneumonia. 3. A small amount of pleural fluid at the right lung base is suspected. This is not greatly changed than on previous studies. 4. Chest CT scanning is recommended if the patient's symptoms warrant further evaluation.  SIGNATURE  Electronically Signed   By: David  Martinique   On: 03/03/2014 17:25   Ct Chest Wo Contrast  03/04/2014   CLINICAL DATA Right pleural effusion on chest radiograph. COPD, fibrosis. Question pneumonia.  EXAM CT CHEST WITHOUT CONTRAST  TECHNIQUE Multidetector CT imaging of the chest was performed following the standard protocol without IV contrast.  COMPARISON DG CHEST 2 VIEW dated 03/03/2014; CT CHEST W/O CM dated 11/18/2012  FINDINGS No pathologically enlarged mediastinal or axillary lymph nodes. Hilar regions are difficult to definitively evaluate without IV contrast. Atherosclerotic calcification of the arterial vasculature, including extensive involvement of the coronary arteries. Heart is enlarged. No pericardial effusion.  A thin rind of pleural fluid is seen in the lower right hemi thorax with associated pleural thickening. Scattered scarring, volume loss and probable rounded atelectasis at the base of the right hemi thorax. Mild volume loss in the left lower lobe, increased from 11/18/2012, adjacent to an elevated left hemidiaphragm. Airway is unremarkable.  Incidental imaging of the upper abdomen shows the visualized portion of the liver to be grossly unremarkable. Cholecystectomy. Slight thickening of the right adrenal gland, as before. Visualized portion of the left adrenal gland, spleen, pancreas, stomach and bowel are otherwise grossly unremarkable. No upper abdominal adenopathy. No  worrisome lytic or sclerotic lesions. Degenerative changes are seen in the spine.  IMPRESSION 1. Small right fibrothorax with  adjacent scarring and probable rounded atelectasis at the base of the right hemi thorax. 2. Coronary artery calcification. 3. Slightly increased volume loss in the left lower lobe, adjacent to an elevated left hemidiaphragm.  SIGNATURE  Electronically Signed   By: Lorin Picket M.D.   On: 03/04/2014 17:13    Microbiology: Recent Results (from the past 240 hour(s))  MRSA PCR SCREENING     Status: None   Collection Time    03/03/14  8:37 PM      Result Value Ref Range Status   MRSA by PCR NEGATIVE  NEGATIVE Final   Comment:            The GeneXpert MRSA Assay (FDA     approved for NASAL specimens     only), is one component of a     comprehensive MRSA colonization     surveillance program. It is not     intended to diagnose MRSA     infection nor to guide or     monitor treatment for     MRSA infections.     Labs: Basic Metabolic Panel:  Recent Labs Lab 03/03/14 1647 03/04/14 0520 03/05/14 0320  NA 138 141 138  K 4.5 4.1 4.2  CL 102 105 100  CO2 22 26 26   GLUCOSE 138* 98 130*  BUN 29* 27* 28*  CREATININE 1.67* 1.47* 1.46*  CALCIUM 8.5 8.1* 8.6   CBC:  Recent Labs Lab 03/03/14 1647 03/04/14 0520 03/05/14 0320  WBC 10.0 8.3 8.0  NEUTROABS 8.6*  --   --   HGB 12.3* 11.6* 12.0*  HCT 36.1* 35.5* 37.0*  MCV 104.3* 105.0* 105.4*  PLT 263 234 247   Cardiac Enzymes:  Recent Labs Lab 03/03/14 1647 03/03/14 2250 03/04/14 0520 03/04/14 1112  TROPONINI <0.30 <0.30 <0.30 <0.30   BNP: BNP (last 3 results)  Recent Labs  04/16/13 2327 03/03/14 1647  PROBNP 1599.0* 1635.0*   CBG:  Recent Labs Lab 03/06/14 1155 03/06/14 1646 03/06/14 2144 03/07/14 0744 03/07/14 1221  GLUCAP 122* 123* 152* 106* 114*    Signed:  GHERGHE, COSTIN  [facility] 03/08/2014, 2:17 PM

## 2014-03-12 ENCOUNTER — Encounter (HOSPITAL_COMMUNITY): Payer: Self-pay | Admitting: Emergency Medicine

## 2014-03-12 ENCOUNTER — Emergency Department (HOSPITAL_COMMUNITY): Payer: Medicare Other

## 2014-03-12 ENCOUNTER — Inpatient Hospital Stay (HOSPITAL_COMMUNITY)
Admission: EM | Admit: 2014-03-12 | Discharge: 2014-03-17 | DRG: 193 | Disposition: A | Discharge status: Skilled Nursing Facility | Payer: Medicare Other | Attending: Internal Medicine | Admitting: Internal Medicine

## 2014-03-12 DIAGNOSIS — E039 Hypothyroidism, unspecified: Secondary | ICD-10-CM | POA: Diagnosis present

## 2014-03-12 DIAGNOSIS — Z8673 Personal history of transient ischemic attack (TIA), and cerebral infarction without residual deficits: Secondary | ICD-10-CM

## 2014-03-12 DIAGNOSIS — R32 Unspecified urinary incontinence: Secondary | ICD-10-CM | POA: Diagnosis present

## 2014-03-12 DIAGNOSIS — E119 Type 2 diabetes mellitus without complications: Secondary | ICD-10-CM | POA: Diagnosis present

## 2014-03-12 DIAGNOSIS — F32A Depression, unspecified: Secondary | ICD-10-CM | POA: Diagnosis present

## 2014-03-12 DIAGNOSIS — T380X5A Adverse effect of glucocorticoids and synthetic analogues, initial encounter: Secondary | ICD-10-CM | POA: Diagnosis present

## 2014-03-12 DIAGNOSIS — F0390 Unspecified dementia without behavioral disturbance: Secondary | ICD-10-CM | POA: Diagnosis present

## 2014-03-12 DIAGNOSIS — F411 Generalized anxiety disorder: Secondary | ICD-10-CM | POA: Diagnosis present

## 2014-03-12 DIAGNOSIS — E876 Hypokalemia: Secondary | ICD-10-CM | POA: Diagnosis not present

## 2014-03-12 DIAGNOSIS — F3289 Other specified depressive episodes: Secondary | ICD-10-CM | POA: Diagnosis present

## 2014-03-12 DIAGNOSIS — I10 Essential (primary) hypertension: Secondary | ICD-10-CM | POA: Diagnosis present

## 2014-03-12 DIAGNOSIS — I2510 Atherosclerotic heart disease of native coronary artery without angina pectoris: Secondary | ICD-10-CM | POA: Diagnosis present

## 2014-03-12 DIAGNOSIS — I359 Nonrheumatic aortic valve disorder, unspecified: Secondary | ICD-10-CM | POA: Diagnosis present

## 2014-03-12 DIAGNOSIS — J4489 Other specified chronic obstructive pulmonary disease: Secondary | ICD-10-CM | POA: Diagnosis present

## 2014-03-12 DIAGNOSIS — D649 Anemia, unspecified: Secondary | ICD-10-CM | POA: Diagnosis present

## 2014-03-12 DIAGNOSIS — E785 Hyperlipidemia, unspecified: Secondary | ICD-10-CM | POA: Diagnosis present

## 2014-03-12 DIAGNOSIS — J9601 Acute respiratory failure with hypoxia: Secondary | ICD-10-CM

## 2014-03-12 DIAGNOSIS — N184 Chronic kidney disease, stage 4 (severe): Secondary | ICD-10-CM | POA: Diagnosis present

## 2014-03-12 DIAGNOSIS — E538 Deficiency of other specified B group vitamins: Secondary | ICD-10-CM | POA: Diagnosis present

## 2014-03-12 DIAGNOSIS — Z8546 Personal history of malignant neoplasm of prostate: Secondary | ICD-10-CM

## 2014-03-12 DIAGNOSIS — M1990 Unspecified osteoarthritis, unspecified site: Secondary | ICD-10-CM | POA: Diagnosis present

## 2014-03-12 DIAGNOSIS — I129 Hypertensive chronic kidney disease with stage 1 through stage 4 chronic kidney disease, or unspecified chronic kidney disease: Secondary | ICD-10-CM | POA: Diagnosis present

## 2014-03-12 DIAGNOSIS — F0391 Unspecified dementia with behavioral disturbance: Secondary | ICD-10-CM | POA: Diagnosis present

## 2014-03-12 DIAGNOSIS — M353 Polymyalgia rheumatica: Secondary | ICD-10-CM | POA: Diagnosis present

## 2014-03-12 DIAGNOSIS — J9600 Acute respiratory failure, unspecified whether with hypoxia or hypercapnia: Secondary | ICD-10-CM | POA: Diagnosis present

## 2014-03-12 DIAGNOSIS — Z79899 Other long term (current) drug therapy: Secondary | ICD-10-CM

## 2014-03-12 DIAGNOSIS — J189 Pneumonia, unspecified organism: Principal | ICD-10-CM | POA: Diagnosis present

## 2014-03-12 DIAGNOSIS — N039 Chronic nephritic syndrome with unspecified morphologic changes: Secondary | ICD-10-CM

## 2014-03-12 DIAGNOSIS — F329 Major depressive disorder, single episode, unspecified: Secondary | ICD-10-CM | POA: Diagnosis present

## 2014-03-12 DIAGNOSIS — F03918 Unspecified dementia, unspecified severity, with other behavioral disturbance: Secondary | ICD-10-CM | POA: Diagnosis present

## 2014-03-12 DIAGNOSIS — Z8249 Family history of ischemic heart disease and other diseases of the circulatory system: Secondary | ICD-10-CM

## 2014-03-12 DIAGNOSIS — J449 Chronic obstructive pulmonary disease, unspecified: Secondary | ICD-10-CM | POA: Diagnosis present

## 2014-03-12 DIAGNOSIS — Z96659 Presence of unspecified artificial knee joint: Secondary | ICD-10-CM

## 2014-03-12 DIAGNOSIS — D631 Anemia in chronic kidney disease: Secondary | ICD-10-CM | POA: Diagnosis present

## 2014-03-12 DIAGNOSIS — IMO0002 Reserved for concepts with insufficient information to code with codable children: Secondary | ICD-10-CM

## 2014-03-12 DIAGNOSIS — I4891 Unspecified atrial fibrillation: Secondary | ICD-10-CM | POA: Diagnosis present

## 2014-03-12 DIAGNOSIS — Z823 Family history of stroke: Secondary | ICD-10-CM

## 2014-03-12 DIAGNOSIS — E669 Obesity, unspecified: Secondary | ICD-10-CM | POA: Diagnosis present

## 2014-03-12 DIAGNOSIS — D72829 Elevated white blood cell count, unspecified: Secondary | ICD-10-CM | POA: Diagnosis present

## 2014-03-12 DIAGNOSIS — G4700 Insomnia, unspecified: Secondary | ICD-10-CM | POA: Diagnosis present

## 2014-03-12 DIAGNOSIS — I252 Old myocardial infarction: Secondary | ICD-10-CM

## 2014-03-12 DIAGNOSIS — G4733 Obstructive sleep apnea (adult) (pediatric): Secondary | ICD-10-CM | POA: Diagnosis present

## 2014-03-12 LAB — BLOOD GAS, ARTERIAL
Acid-Base Excess: 2.5 mmol/L — ABNORMAL HIGH (ref 0.0–2.0)
BICARBONATE: 26.6 meq/L — AB (ref 20.0–24.0)
Drawn by: 257701
O2 Content: 2 L/min
O2 Saturation: 93.2 %
PCO2 ART: 41.3 mmHg (ref 35.0–45.0)
PO2 ART: 68.5 mmHg — AB (ref 80.0–100.0)
Patient temperature: 98.6
TCO2: 24 mmol/L (ref 0–100)
pH, Arterial: 7.425 (ref 7.350–7.450)

## 2014-03-12 LAB — CBC WITH DIFFERENTIAL/PLATELET
BASOS ABS: 0 10*3/uL (ref 0.0–0.1)
Basophils Absolute: 0 10*3/uL (ref 0.0–0.1)
Basophils Relative: 0 % (ref 0–1)
Basophils Relative: 0 % (ref 0–1)
EOS ABS: 0.1 10*3/uL (ref 0.0–0.7)
EOS PCT: 0 % (ref 0–5)
Eosinophils Absolute: 0 10*3/uL (ref 0.0–0.7)
Eosinophils Relative: 1 % (ref 0–5)
HEMATOCRIT: 38.9 % — AB (ref 39.0–52.0)
HEMATOCRIT: 34.9 % — AB (ref 39.0–52.0)
HEMOGLOBIN: 12.7 g/dL — AB (ref 13.0–17.0)
Hemoglobin: 11.6 g/dL — ABNORMAL LOW (ref 13.0–17.0)
LYMPHS ABS: 1.4 10*3/uL (ref 0.7–4.0)
LYMPHS PCT: 12 % (ref 12–46)
Lymphocytes Relative: 13 % (ref 12–46)
Lymphs Abs: 1 10*3/uL (ref 0.7–4.0)
MCH: 34.3 pg — AB (ref 26.0–34.0)
MCH: 34.7 pg — ABNORMAL HIGH (ref 26.0–34.0)
MCHC: 32.6 g/dL (ref 30.0–36.0)
MCHC: 33.2 g/dL (ref 30.0–36.0)
MCV: 105.1 fL — AB (ref 78.0–100.0)
MCV: 104.5 fL — AB (ref 78.0–100.0)
MONO ABS: 0.9 10*3/uL (ref 0.1–1.0)
MONOS PCT: 8 % (ref 3–12)
Monocytes Absolute: 0.5 10*3/uL (ref 0.1–1.0)
Monocytes Relative: 6 % (ref 3–12)
Neutro Abs: 8.8 10*3/uL — ABNORMAL HIGH (ref 1.7–7.7)
Neutro Abs: 6.7 10*3/uL (ref 1.7–7.7)
Neutrophils Relative %: 78 % — ABNORMAL HIGH (ref 43–77)
Neutrophils Relative %: 82 % — ABNORMAL HIGH (ref 43–77)
PLATELETS: 312 10*3/uL (ref 150–400)
Platelets: 336 10*3/uL (ref 150–400)
RBC: 3.7 MIL/uL — AB (ref 4.22–5.81)
RBC: 3.34 MIL/uL — AB (ref 4.22–5.81)
RDW: 14.2 % (ref 11.5–15.5)
RDW: 14.3 % (ref 11.5–15.5)
WBC: 11.2 10*3/uL — ABNORMAL HIGH (ref 4.0–10.5)
WBC: 8.2 10*3/uL (ref 4.0–10.5)

## 2014-03-12 LAB — COMPREHENSIVE METABOLIC PANEL
ALBUMIN: 3.1 g/dL — AB (ref 3.5–5.2)
ALK PHOS: 60 U/L (ref 39–117)
ALT: 16 U/L (ref 0–53)
ALT: 13 U/L (ref 0–53)
AST: 30 U/L (ref 0–37)
AST: 21 U/L (ref 0–37)
Albumin: 3.5 g/dL (ref 3.5–5.2)
Alkaline Phosphatase: 55 U/L (ref 39–117)
BILIRUBIN TOTAL: 0.4 mg/dL (ref 0.3–1.2)
BUN: 28 mg/dL — AB (ref 6–23)
BUN: 26 mg/dL — ABNORMAL HIGH (ref 6–23)
CO2: 26 mEq/L (ref 19–32)
CO2: 28 meq/L (ref 19–32)
CREATININE: 1.5 mg/dL — AB (ref 0.50–1.35)
Calcium: 9.2 mg/dL (ref 8.4–10.5)
Calcium: 8.5 mg/dL (ref 8.4–10.5)
Chloride: 100 mEq/L (ref 96–112)
Chloride: 102 mEq/L (ref 96–112)
Creatinine, Ser: 1.62 mg/dL — ABNORMAL HIGH (ref 0.50–1.35)
GFR calc Af Amer: 46 mL/min — ABNORMAL LOW (ref >90)
GFR calc non Af Amer: 36 mL/min — ABNORMAL LOW (ref >90)
GFR calc non Af Amer: 40 mL/min — ABNORMAL LOW (ref >90)
GFR, EST AFRICAN AMERICAN: 42 mL/min — AB (ref >90)
GLUCOSE: 127 mg/dL — AB (ref 70–99)
Glucose, Bld: 136 mg/dL — ABNORMAL HIGH (ref 70–99)
POTASSIUM: 4 meq/L (ref 3.7–5.3)
Potassium: 4.3 mEq/L (ref 3.7–5.3)
SODIUM: 142 meq/L (ref 137–147)
Sodium: 141 mEq/L (ref 137–147)
TOTAL PROTEIN: 6.8 g/dL (ref 6.0–8.3)
Total Bilirubin: 0.4 mg/dL (ref 0.3–1.2)
Total Protein: 5.9 g/dL — ABNORMAL LOW (ref 6.0–8.3)

## 2014-03-12 LAB — PHOSPHORUS: Phosphorus: 3.6 mg/dL (ref 2.3–4.6)

## 2014-03-12 LAB — TSH: TSH: 1.068 u[IU]/mL (ref 0.350–4.500)

## 2014-03-12 LAB — APTT
APTT: 29 s (ref 24–37)
aPTT: 27 seconds (ref 24–37)

## 2014-03-12 LAB — I-STAT CG4 LACTIC ACID, ED: Lactic Acid, Venous: 2.31 mmol/L — ABNORMAL HIGH (ref 0.5–2.2)

## 2014-03-12 LAB — STREP PNEUMONIAE URINARY ANTIGEN: Strep Pneumo Urinary Antigen: NEGATIVE

## 2014-03-12 LAB — URINALYSIS, ROUTINE W REFLEX MICROSCOPIC
Bilirubin Urine: NEGATIVE
Glucose, UA: NEGATIVE mg/dL
Hgb urine dipstick: NEGATIVE
KETONES UR: NEGATIVE mg/dL
LEUKOCYTES UA: NEGATIVE
NITRITE: NEGATIVE
PROTEIN: NEGATIVE mg/dL
Specific Gravity, Urine: 1.02 (ref 1.005–1.030)
Urobilinogen, UA: 1 mg/dL (ref 0.0–1.0)
pH: 6 (ref 5.0–8.0)

## 2014-03-12 LAB — MAGNESIUM: Magnesium: 2.2 mg/dL (ref 1.5–2.5)

## 2014-03-12 LAB — PROTIME-INR
INR: 1.09 (ref 0.00–1.49)
INR: 1.13 (ref 0.00–1.49)
Prothrombin Time: 13.9 seconds (ref 11.6–15.2)
Prothrombin Time: 14.3 seconds (ref 11.6–15.2)

## 2014-03-12 MED ORDER — MIRABEGRON ER 25 MG PO TB24
25.0000 mg | ORAL_TABLET | Freq: Every day | ORAL | Status: DC
  Administered 2014-03-12 – 2014-03-16 (×4): 25 mg via ORAL
  Filled 2014-03-12 (×6): qty 1

## 2014-03-12 MED ORDER — VITAMIN D3 25 MCG (1000 UNIT) PO TABS
1000.0000 [IU] | ORAL_TABLET | Freq: Every day | ORAL | Status: DC
  Administered 2014-03-12 – 2014-03-16 (×4): 1000 [IU] via ORAL
  Filled 2014-03-12 (×7): qty 1

## 2014-03-12 MED ORDER — VANCOMYCIN HCL 10 G IV SOLR
1250.0000 mg | INTRAVENOUS | Status: DC
  Administered 2014-03-13 – 2014-03-16 (×4): 1250 mg via INTRAVENOUS
  Filled 2014-03-12 (×5): qty 1250

## 2014-03-12 MED ORDER — TRAMADOL HCL 50 MG PO TABS
50.0000 mg | ORAL_TABLET | Freq: Four times a day (QID) | ORAL | Status: DC | PRN
  Administered 2014-03-14 – 2014-03-17 (×5): 50 mg via ORAL
  Filled 2014-03-12 (×5): qty 1

## 2014-03-12 MED ORDER — ACETAMINOPHEN 650 MG RE SUPP: 650.0000 mg | Freq: Four times a day (QID) | RECTAL | Status: DC | PRN

## 2014-03-12 MED ORDER — MORPHINE SULFATE 2 MG/ML IJ SOLN
1.0000 mg | INTRAMUSCULAR | Status: DC | PRN
  Administered 2014-03-13 – 2014-03-15 (×2): 1 mg via INTRAVENOUS
  Filled 2014-03-12 (×2): qty 1

## 2014-03-12 MED ORDER — VITAMIN C 500 MG PO TABS
500.0000 mg | ORAL_TABLET | Freq: Every day | ORAL | Status: DC
  Administered 2014-03-12 – 2014-03-16 (×4): 500 mg via ORAL
  Filled 2014-03-12 (×7): qty 1

## 2014-03-12 MED ORDER — ONDANSETRON HCL 4 MG/2ML IJ SOLN: 4.0000 mg | Freq: Four times a day (QID) | INTRAMUSCULAR | Status: DC | PRN

## 2014-03-12 MED ORDER — ONDANSETRON HCL 4 MG PO TABS: 4.0000 mg | ORAL_TABLET | Freq: Four times a day (QID) | ORAL | Status: DC | PRN

## 2014-03-12 MED ORDER — IPRATROPIUM BROMIDE 0.02 % IN SOLN
0.5000 mg | Freq: Four times a day (QID) | RESPIRATORY_TRACT | Status: DC
  Administered 2014-03-12 – 2014-03-14 (×10): 0.5 mg via RESPIRATORY_TRACT
  Filled 2014-03-12 (×10): qty 2.5

## 2014-03-12 MED ORDER — DEXTROSE 5 % IV SOLN
1.0000 g | INTRAVENOUS | Status: DC
  Administered 2014-03-13: 1 g via INTRAVENOUS
  Filled 2014-03-12 (×2): qty 1

## 2014-03-12 MED ORDER — AMLODIPINE BESYLATE 5 MG PO TABS
5.0000 mg | ORAL_TABLET | Freq: Every day | ORAL | Status: DC
  Administered 2014-03-12 – 2014-03-13 (×2): 5 mg via ORAL
  Filled 2014-03-12 (×3): qty 1

## 2014-03-12 MED ORDER — POLYETHYLENE GLYCOL 3350 17 G PO PACK
17.0000 g | PACK | Freq: Every day | ORAL | Status: DC | PRN
  Filled 2014-03-12: qty 1

## 2014-03-12 MED ORDER — LORAZEPAM 0.5 MG PO TABS
0.5000 mg | ORAL_TABLET | Freq: Once | ORAL | Status: AC
Start: 2014-03-12 — End: 2014-03-12
  Administered 2014-03-12: 0.5 mg via ORAL
  Filled 2014-03-12: qty 1

## 2014-03-12 MED ORDER — RISPERIDONE 1 MG PO TABS
1.0000 mg | ORAL_TABLET | Freq: Every day | ORAL | Status: DC
  Administered 2014-03-12 – 2014-03-14 (×2): 1 mg via ORAL
  Filled 2014-03-12 (×2): qty 1
  Filled 2014-03-12: qty 2
  Filled 2014-03-12: qty 1

## 2014-03-12 MED ORDER — SODIUM CHLORIDE 0.9 % IV SOLN
1000.0000 mL | INTRAVENOUS | Status: DC
  Administered 2014-03-12: 1000 mL via INTRAVENOUS

## 2014-03-12 MED ORDER — CYCLOBENZAPRINE HCL 10 MG PO TABS
10.0000 mg | ORAL_TABLET | Freq: Three times a day (TID) | ORAL | Status: DC | PRN
  Filled 2014-03-12: qty 1

## 2014-03-12 MED ORDER — LEVALBUTEROL HCL 0.63 MG/3ML IN NEBU
0.6300 mg | INHALATION_SOLUTION | Freq: Four times a day (QID) | RESPIRATORY_TRACT | Status: DC
  Administered 2014-03-12 – 2014-03-14 (×10): 0.63 mg via RESPIRATORY_TRACT
  Filled 2014-03-12 (×21): qty 3

## 2014-03-12 MED ORDER — FOLIC ACID 1 MG PO TABS
1.0000 mg | ORAL_TABLET | Freq: Every morning | ORAL | Status: DC
  Administered 2014-03-13 – 2014-03-17 (×4): 1 mg via ORAL
  Filled 2014-03-12 (×5): qty 1

## 2014-03-12 MED ORDER — ACETAMINOPHEN 325 MG PO TABS: 650.0000 mg | ORAL_TABLET | Freq: Four times a day (QID) | ORAL | Status: DC | PRN

## 2014-03-12 MED ORDER — RISPERIDONE 0.5 MG PO TABS
0.5000 mg | ORAL_TABLET | Freq: Every day | ORAL | Status: DC
  Administered 2014-03-13 – 2014-03-17 (×4): 0.5 mg via ORAL
  Filled 2014-03-12 (×5): qty 1

## 2014-03-12 MED ORDER — CLOPIDOGREL BISULFATE 75 MG PO TABS
75.0000 mg | ORAL_TABLET | Freq: Every evening | ORAL | Status: DC
  Administered 2014-03-12 – 2014-03-16 (×5): 75 mg via ORAL
  Filled 2014-03-12 (×6): qty 1

## 2014-03-12 MED ORDER — LEVALBUTEROL HCL 0.63 MG/3ML IN NEBU
0.6300 mg | INHALATION_SOLUTION | RESPIRATORY_TRACT | Status: DC | PRN
  Filled 2014-03-12: qty 3

## 2014-03-12 MED ORDER — ATORVASTATIN CALCIUM 10 MG PO TABS
10.0000 mg | ORAL_TABLET | Freq: Every day | ORAL | Status: DC
  Administered 2014-03-12 – 2014-03-16 (×5): 10 mg via ORAL
  Filled 2014-03-12 (×6): qty 1

## 2014-03-12 MED ORDER — MECLIZINE HCL 25 MG PO TABS
25.0000 mg | ORAL_TABLET | Freq: Three times a day (TID) | ORAL | Status: DC | PRN
  Filled 2014-03-12: qty 1

## 2014-03-12 MED ORDER — DESMOPRESSIN ACETATE 0.1 MG PO TABS
0.1000 mg | ORAL_TABLET | Freq: Every evening | ORAL | Status: DC
  Administered 2014-03-12 – 2014-03-16 (×5): 0.1 mg via ORAL
  Filled 2014-03-12 (×7): qty 1

## 2014-03-12 MED ORDER — FUROSEMIDE 20 MG PO TABS
20.0000 mg | ORAL_TABLET | Freq: Two times a day (BID) | ORAL | Status: DC
  Administered 2014-03-12 – 2014-03-17 (×9): 20 mg via ORAL
  Filled 2014-03-12 (×12): qty 1

## 2014-03-12 MED ORDER — IPRATROPIUM BROMIDE 0.02 % IN SOLN: 0.5000 mg | RESPIRATORY_TRACT | Status: DC | PRN

## 2014-03-12 MED ORDER — SODIUM CHLORIDE 0.9 % IV SOLN
2000.0000 mg | Freq: Once | INTRAVENOUS | Status: AC
  Administered 2014-03-12: 2000 mg via INTRAVENOUS
  Filled 2014-03-12: qty 2000

## 2014-03-12 MED ORDER — CYANOCOBALAMIN 500 MCG PO TABS
500.0000 ug | ORAL_TABLET | Freq: Every morning | ORAL | Status: DC
  Administered 2014-03-12 – 2014-03-17 (×5): 500 ug via ORAL
  Filled 2014-03-12 (×6): qty 1

## 2014-03-12 MED ORDER — METHYLPREDNISOLONE 4 MG PO TABS
6.0000 mg | ORAL_TABLET | Freq: Every day | ORAL | Status: DC
  Administered 2014-03-14 – 2014-03-17 (×4): 6 mg via ORAL
  Filled 2014-03-12 (×7): qty 1

## 2014-03-12 MED ORDER — METHOTREXATE 2.5 MG PO TABS: 10.0000 mg | ORAL_TABLET | ORAL | Status: DC

## 2014-03-12 MED ORDER — HYDRALAZINE HCL 20 MG/ML IJ SOLN
10.0000 mg | Freq: Four times a day (QID) | INTRAMUSCULAR | Status: DC | PRN
  Administered 2014-03-12 – 2014-03-13 (×2): 10 mg via INTRAVENOUS
  Filled 2014-03-12 (×3): qty 0.5

## 2014-03-12 MED ORDER — MIDODRINE HCL 2.5 MG PO TABS
2.5000 mg | ORAL_TABLET | ORAL | Status: DC
  Administered 2014-03-12 – 2014-03-13 (×2): 2.5 mg via ORAL
  Filled 2014-03-12 (×6): qty 1

## 2014-03-12 MED ORDER — HYOSCYAMINE SULFATE 0.125 MG PO TABS
0.1250 mg | ORAL_TABLET | Freq: Four times a day (QID) | ORAL | Status: DC | PRN
  Filled 2014-03-12: qty 1

## 2014-03-12 MED ORDER — DEXTROSE 5 % IV SOLN
1.0000 g | INTRAVENOUS | Status: AC
  Administered 2014-03-12: 1 g via INTRAVENOUS
  Filled 2014-03-12: qty 1

## 2014-03-12 MED ORDER — LEVOTHYROXINE SODIUM 137 MCG PO TABS
137.0000 ug | ORAL_TABLET | Freq: Every day | ORAL | Status: DC
  Administered 2014-03-14 – 2014-03-17 (×4): 137 ug via ORAL
  Filled 2014-03-12 (×7): qty 1

## 2014-03-12 MED ORDER — DARIFENACIN HYDROBROMIDE ER 7.5 MG PO TB24
7.5000 mg | ORAL_TABLET | Freq: Every day | ORAL | Status: DC
  Administered 2014-03-12 – 2014-03-17 (×5): 7.5 mg via ORAL
  Filled 2014-03-12 (×7): qty 1

## 2014-03-12 MED ORDER — SODIUM CHLORIDE 0.9 % IV SOLN
INTRAVENOUS | Status: AC
  Administered 2014-03-12: 16:00:00 via INTRAVENOUS

## 2014-03-12 MED ORDER — SERTRALINE HCL 100 MG PO TABS
100.0000 mg | ORAL_TABLET | Freq: Every day | ORAL | Status: DC
  Administered 2014-03-12 – 2014-03-16 (×4): 100 mg via ORAL
  Filled 2014-03-12 (×7): qty 1

## 2014-03-12 MED ORDER — BISACODYL 5 MG PO TBEC: 5.0000 mg | DELAYED_RELEASE_TABLET | Freq: Every day | ORAL | Status: DC | PRN

## 2014-03-12 NOTE — Progress Notes (Signed)
ANTIBIOTIC CONSULT NOTE - INITIAL  Pharmacy Consult for Vancomycin & Cefepime  Indication: HCAP  Allergies  Allergen Reactions  . Bactrim [Sulfamethoxazole-Trimethoprim] Other (See Comments)    hallucinations  . Horse-Derived Products Hives   Patient Measurements:   TBW: 94 kg  Vital Signs: Temp: 98.3 F (36.8 C) (03/19 1104) BP: 129/66 mmHg (03/19 1115) Pulse Rate: 76 (03/19 1113) Intake/Output from previous day:   Intake/Output from this shift: Total I/O In: -  Out: 40 [Urine:40]  Labs:  Recent Labs  03/12/14 1115  WBC 11.2*  HGB 12.7*  PLT 336  CREATININE 1.62*   The CrCl is unknown because both a height and weight (above a minimum accepted value) are required for this calculation. No results found for this basename: VANCOTROUGH, Corlis Leak, VANCORANDOM, [place], GENTPEAK, GENTRANDOM, TOBRATROUGH, TOBRAPEAK, TOBRARND, AMIKACINPEAK, AMIKACINTROU, AMIKACIN,  in the last 72 hours   Microbiology: Recent Results (from the past 720 hour(s))  MRSA PCR SCREENING     Status: None   Collection Time    03/03/14  8:37 PM      Result Value Ref Range Status   MRSA by PCR NEGATIVE  NEGATIVE Final   Comment:            The GeneXpert MRSA Assay (FDA     approved for NASAL specimens     only), is one component of a     comprehensive MRSA colonization     surveillance program. It is not     intended to diagnose MRSA     infection nor to guide or     monitor treatment for     MRSA infections.   Medical History: Past Medical History  Diagnosis Date  . CAD (coronary artery disease)     s/p NSTEMI and BMS stent diagonal07/09;  Lexiscan Myoview 9/13:  EF 62%, no ischemia  . AS (aortic stenosis)     a.  Echo 2009 mean gradient 16mm HG. AVA 2.18;  b.  Echo 4/11 mild AS mean gradient 81mm HG;  c.  Echo 04/2011: Mild LVH, EF 73-71%, grade 1 diastolic dysfunction, mild aortic stenosis, mean gradient 14, mild LAE;  d. Echo 9/13: mod LVH, EF 50-55%, mild to mod AS, mean gradient  17 mmHg  . AF (paroxysmal atrial fibrillation)     Holter monitor 2.5 sec pauses  . Fatigue     CPX 11/09 VO2  14.4 (75% predicte)d, slope 35.  O2 pulse normal.  VO2 corrected for body weight 17.6.  Marland Kitchen Hypothyroidism   . Hyperlipidemia   . History of prostate cancer   . Allergic rhinitis   . DM (diabetes mellitus)     type II diet controlled  . Shingles   . Left carotid stenosis   . Vitamin B 12 deficiency   . Anemia   . Osteoarthritis   . Nephrolithiasis   . Obesity   . OSA (obstructive sleep apnea)     AHI 15 PSG 09/06/08  . Hx of colonoscopy   . Stroke   . Anxiety and depression   . PMR (polymyalgia rheumatica) 05/29/2012  . Hypogonadism male 05/29/2012  . Hyperparathyroidism 05/29/2012  . TIA (transient ischemic attack) 05/29/2012  . CKD (chronic kidney disease) stage 4, GFR 15-29 ml/min 05/29/2012  . COPD (chronic obstructive pulmonary disease) 05/29/2012  . Myocardial infarction   . Cancer     prostate ca history   Medications:  Scheduled:   Anti-infectives   Start     Dose/Rate Route Frequency Ordered Stop  03/12/14 1315  ceFEPIme (MAXIPIME) 1 g in dextrose 5 % 50 mL IVPB     1 g 100 mL/hr over 30 Minutes Intravenous STAT 03/12/14 1302 03/13/14 1315     Assessment: 89 yoM with probable HCAP, in hospital 1/22 for CAP treated with Rocephin/Azithromycin. Seen in ED 3/10 for Afib/RVR. Now with hallucinations, abnormal behavior, c/o headache, elevated lactic acid, SCr (hx of CKD). CXray suggestive of bilateral PNA  Vancomycin and Cefepime x 8 days, dose per pharmacy  Goal of Therapy:  Vancomycin trough level 15-20 mcg/ml  Plan:   Vancomycin 2gm x1 in ED, then 1250mg  q24 x 7 days  Cefepime 1gm q24 x 8 days  Minda Ditto PharmD Pager [phone] 03/12/2014, 1:31 PM

## 2014-03-12 NOTE — ED Notes (Signed)
Per family pt having increased confusion since yesterday; this morning pt pushed out screen from window and threw clothes out the window; pt has caregiver that stays with him; pt c/o headache and neck pain; cough/congestion--pt admitted 3/10-3/14 for pneumonia; pt alert to person/place/time; unsure of events this morning; hypotensive in triage

## 2014-03-12 NOTE — H&P (Signed)
[facility] History and Physical  ESHAN TRUPIANO HYQ:[id number] DOB: [birth year] DOA: 03/12/2014  Referring physician: ER physician PCP: Cathlean Cower, MD   Chief Complaint: "not feeling very well"  HPI:  80 year old male with past medical history of COPD, PMR, paroxysmal atrial fibrillation (not on anticoagulation due to risk of falls), CKD, hypothyroidism, CAD, dyslipidemia, recent admission for COPD exacerbation, pneumonia and atrial fibrillation with RVR (discharged 03/08/2014) who presented to [facility] ED. 03/12/2014 feeling weak and confused. Pt reported he had hallucinations and thought he is getting an infection (which he reported usually happens to him when he gets an infection, he hallucinates). He reports having cough and congestion for quite some time, and most recently felt feverish. No chest pain, no palpitations. He had shortness of breath. No abdominal pain, nausea or vomiting. No lightheadedness or loss of consciousness. No falls.  In ED, his BP was 97/49, HR 63, Tmax 98.3 F and oxygen saturation 93% on 2 L Benjamin oxygen support.  Blood work revealed WBC count of 11.2, hemoglobin 12.7 and creatinine of 1.62. CXR showed increased patchy consolidation of both lung bases, developing pneumonia not excluded. CT head and cervical spine did not reveal acute intracranial findings. Pt was started on cefepime and vanco for possible HCAP.  Assessment and Plan:  Principal Problem:   Acute respiratory failure with hypoxia - secondary to HCAP considering recent hospitalization - continue vanco and cefepime - follow up blood culture, resp culture, legionella, strep pneumo and influenza results - oxygen support via nasal canula to keep O2 saturation above 90% - nebulizer treatments scheduled and as needed  Active Problems:   HCAP (healthcare-associated pneumonia) - continue vanco and cefepime as mentioned above - pneumonia order set in place - follow up blood culture, resp culture, legionella,  strep pneumo and influenza results   HYPOTHYROIDISM   HYPERLIPIDEMIA - continue statin therapy    ANEMIA - likely of chronic kidney disease - hemoglobin 12.7 on admission - no current indications for transfusion   HYPERTENSION, BENIGN - start norvasc 5 mg daily and hydralazine PRN for better BP control   Depression   CKD (chronic kidney disease) stage 4, GFR 15-29 ml/min - creatinine of admission 1.62 and subsequently 1.50 which is around patient's baseline (on recent admission creatinine 1.47)   COPD (chronic obstructive pulmonary disease) - COPD goled alert ordered - nebulizer treatments scheduled and as needed, steroid low dose per home regimen, abx for HCAP   Chronic steroid use - has history of hypogonadism and PMR   PMR (polymyalgia rheumatica) - on chronic steroids - continue methotrexate    Anxiety and depression - continue zoloft    Urinary incontinence - continue myrbetriq    Radiological Exams on Admission: Dg Chest 2 View 03/12/2014    IMPRESSION: Increased patchy consolidation of both lung bases, developing pneumonia not excluded.   Ct Head Wo Contrast 03/12/2014    IMPRESSION: 1. No acute intracranial abnormality. Stable atrophy and chronic white matter disease. 2. No cervical spine acute fracture. Stable 3.5 mm anterolisthesis C4 on C5 vertebral body. Multilevel degenerative changes as described above.    Ct Cervical Spine Wo Contrast 03/12/2014    IMPRESSION: 1. No acute intracranial abnormality. Stable atrophy and chronic white matter disease. 2. No cervical spine acute fracture. Stable 3.5 mm anterolisthesis C4 on C5 vertebral body. Multilevel degenerative changes as described above.      Code Status: Full Family Communication: Pt at bedside Disposition Plan: Admit for further evaluation  Kristi Hyer,  MD  [facility] Pager [phone]  Review of Systems:  Constitutional: positive for fever, positive for chills and malaise/fatigue. Negative for  diaphoresis.  HENT: Negative for hearing loss, ear pain, nosebleeds, congestion, sore throat, neck pain, tinnitus and ear discharge.   Eyes: Negative for blurred vision, double vision, photophobia, pain, discharge and redness.  Respiratory: positive for cough, shortness of breath Cardiovascular: Negative for chest pain, palpitations, orthopnea, claudication and leg swelling.  Gastrointestinal: Negative for nausea, vomiting and abdominal pain. Negative for heartburn, constipation, blood in stool and melena.  Genitourinary: Negative for dysuria, urgency, frequency, hematuria and flank pain.  Musculoskeletal: Negative for myalgias, back pain, joint pain and falls.  Skin: Negative for itching and rash.  Neurological: Negative for dizziness and weakness. Positive for hallucinations.  Endo/Heme/Allergies: Negative for environmental allergies and polydipsia. Does not bruise/bleed easily.  Psychiatric/Behavioral: Negative for suicidal ideas. The patient is not nervous/anxious.      Past Medical History  Diagnosis Date  . CAD (coronary artery disease)     s/p NSTEMI and BMS stent diagonal07/09;  Lexiscan Myoview 9/13:  EF 62%, no ischemia  . AS (aortic stenosis)     a.  Echo 2009 mean gradient 105mm HG. AVA 2.18;  b.  Echo 4/11 mild AS mean gradient 56mm HG;  c.  Echo 04/2011: Mild LVH, EF 0000000, grade 1 diastolic dysfunction, mild aortic stenosis, mean gradient 14, mild LAE;  d. Echo 9/13: mod LVH, EF 50-55%, mild to mod AS, mean gradient 17 mmHg  . AF (paroxysmal atrial fibrillation)     Holter monitor 2.5 sec pauses  . Fatigue     CPX 11/09 VO2  14.4 (75% predicte)d, slope 35.  O2 pulse normal.  VO2 corrected for body weight 17.6.  Marland Kitchen Hypothyroidism   . Hyperlipidemia   . History of prostate cancer   . Allergic rhinitis   . DM (diabetes mellitus)     type II diet controlled  . Shingles   . Left carotid stenosis   . Vitamin B 12 deficiency   . Anemia   . Osteoarthritis   . Nephrolithiasis    . Obesity   . OSA (obstructive sleep apnea)     AHI 15 PSG 09/06/08  . Hx of colonoscopy   . Stroke   . Anxiety and depression   . PMR (polymyalgia rheumatica) 05/29/2012  . Hypogonadism male 05/29/2012  . Hyperparathyroidism 05/29/2012  . TIA (transient ischemic attack) 05/29/2012  . CKD (chronic kidney disease) stage 4, GFR 15-29 ml/min 05/29/2012  . COPD (chronic obstructive pulmonary disease) 05/29/2012  . Myocardial infarction   . Cancer     prostate ca history   Past Surgical History  Procedure Laterality Date  . Appendectomy    . Hernia repair    . Right carpal tunnel release    . Lumbar spine surgery    . Cystectomy      neck  . Prostatectomy    . Vasectomy    . Total knee arthroplasty      right  . Cholecystectomy     Social History:  reports that he quit smoking about 55 years ago. His smoking use included Cigarettes. He has a 12.5 pack-year smoking history. He quit smokeless tobacco use about 43 years ago. His smokeless tobacco use included Snuff and Chew. He reports that he does not drink alcohol or use illicit drugs.  Allergies  Allergen Reactions  . Bactrim [Sulfamethoxazole-Trimethoprim] Other (See Comments)    hallucinations  . Horse-Derived Products Hives  Family History  Problem Relation Age of Onset  . Stroke Mother 31  . Coronary artery disease      father, brother, sister  . Alcohol abuse Father 46     Prior to Admission medications   Medication Sig Start Date End Date Taking? Authorizing Provider  Ascorbic Acid (VITAMIN C) 500 MG tablet Take 500 mg by mouth at bedtime.    Yes Historical Provider, MD  atorvastatin (LIPITOR) 10 MG tablet Take 10 mg by mouth every morning.   Yes Historical Provider, MD  bisacodyl (DULCOLAX) 5 MG EC tablet Take 1 tablet (5 mg total) by mouth daily as needed for moderate constipation. 01/21/14  Yes Janece Canterbury, MD  cholecalciferol (VITAMIN D) 1000 UNITS tablet Take 1,000 Units by mouth at bedtime.    Yes Historical  Provider, MD  clopidogrel (PLAVIX) 75 MG tablet Take 75 mg by mouth every evening.    Yes Historical Provider, MD  cyanocobalamin 500 MCG tablet Take 500 mcg by mouth every morning.   Yes Historical Provider, MD  cyclobenzaprine (FLEXERIL) 10 MG tablet Take 10 mg by mouth 3 (three) times daily as needed for muscle spasms.   Yes Historical Provider, MD  desmopressin (DDAVP) 0.1 MG tablet Take 0.1 mg by mouth every evening.   Yes Historical Provider, MD  folic acid (FOLVITE) 1 MG tablet Take 1 mg by mouth every morning.    Yes Historical Provider, MD  furosemide (LASIX) 20 MG tablet Take 20 mg by mouth 2 (two) times daily.   Yes Historical Provider, MD  hyoscyamine (LEVSIN, ANASPAZ) 0.125 MG tablet Take 0.125 mg by mouth 4 (four) times daily as needed for cramping.    Yes Historical Provider, MD  Iron-Vitamins (GERITOL COMPLETE PO) Take 1 tablet by mouth every morning.    Yes Historical Provider, MD  levothyroxine (SYNTHROID, LEVOTHROID) 137 MCG tablet Take 137 mcg by mouth daily before breakfast.    Yes Historical Provider, MD  meclizine (ANTIVERT) 25 MG tablet Take 1 tablet (25 mg total) by mouth 3 (three) times daily as needed for dizziness. 08/08/13  Yes Barton Dubois, MD  methotrexate (RHEUMATREX) 2.5 MG tablet Take 10 mg by mouth once a week. Takes 4 tablets weekly on Wednesday.   Caution:Chemotherapy. Protect from light.   Yes Historical Provider, MD  methylPREDNISolone (MEDROL) 4 MG tablet Take 6 mg by mouth every morning.    Yes Historical Provider, MD  midodrine (PROAMATINE) 2.5 MG tablet Take 2.5 mg by mouth 3 (three) times daily. Take one tablet at 7 am, 2pm and at 4pm 09/23/13  Yes Minus Breeding, MD  mirabegron ER (MYRBETRIQ) 25 MG TB24 tablet Take 25 mg by mouth at bedtime.   Yes Historical Provider, MD  polyethylene glycol (MIRALAX / GLYCOLAX) packet Take 17 g by mouth daily as needed for moderate constipation. 01/21/14  Yes Janece Canterbury, MD  risperiDONE (RISPERDAL) 0.5 MG tablet Take  0.5-1 mg by mouth 2 (two) times daily. 1 tablet in the morning. 2 tablets at bedtime.   Yes Historical Provider, MD  sertraline (ZOLOFT) 100 MG tablet Take 100 mg by mouth at bedtime.   Yes Historical Provider, MD  solifenacin (VESICARE) 5 MG tablet Take 5 mg by mouth every morning.   Yes Historical Provider, MD  traMADol (ULTRAM) 50 MG tablet Take one tablet by mouth twice daily for pain 01/29/14  Yes Pricilla Larsson, NP  albuterol (PROVENTIL HFA;VENTOLIN HFA) 108 (90 BASE) MCG/ACT inhaler Inhale 2 puffs into the lungs every 6 (six)  hours as needed for wheezing or shortness of breath. 11/29/12   Biagio Borg, MD  levofloxacin (LEVAQUIN) 750 MG tablet Take 1 tablet (750 mg total) by mouth every other day. 03/07/14   Costin Karlyne Greenspan, MD   Physical Exam: Filed Vitals:   03/12/14 1104 03/12/14 1113 03/12/14 1115 03/12/14 1145  BP: 87/49 125/72 129/66   Pulse: 93 76    Temp: 98.3 F (36.8 C)     Resp: 22 16 34 16  SpO2: 93%       Physical Exam  Constitutional: Appears well-developed and well-nourished. No distress.  HENT: Normocephalic. External right and left ear normal.  Eyes: Conjunctivae and EOM are normal. PERRLA, no scleral icterus.  Neck: Normal ROM. Neck supple. No JVD. No tracheal deviation. CVS: RRR, S1/S2, SEM appreciated  Pulmonary: diminished breath sounds bilaterally, rhonchi in upper lung lobes Abdominal: Soft. BS +,  no distension, tenderness, rebound or guarding.  Musculoskeletal: Normal range of motion. No tenderness Lymphadenopathy: No lymphadenopathy noted, cervical, inguinal. Neuro: Alert. No focal neurologic deficits Skin: Skin is warm and dry. Multiple ecchymosis on upper extremities  Psychiatric: Normal mood and affect.   Labs on Admission:  Basic Metabolic Panel:  Recent Labs Lab 03/12/14 1115  NA 141  K 4.0  CL 100  CO2 26  GLUCOSE 127*  BUN 28*  CREATININE 1.62*  CALCIUM 9.2   Liver Function Tests:  Recent Labs Lab 03/12/14 1115  AST 30  ALT 16   ALKPHOS 60  BILITOT 0.4  PROT 6.8  ALBUMIN 3.5   No results found for this basename: LIPASE, AMYLASE,  in the last 168 hours No results found for this basename: AMMONIA,  in the last 168 hours CBC:  Recent Labs Lab 03/12/14 1115  WBC 11.2*  NEUTROABS 8.8*  HGB 12.7*  HCT 38.9*  MCV 105.1*  PLT 336   Cardiac Enzymes: No results found for this basename: CKTOTAL, CKMB, CKMBINDEX, TROPONINI,  in the last 168 hours BNP: No components found with this basename: POCBNP,  CBG:  Recent Labs Lab 03/06/14 1155 03/06/14 1646 03/06/14 2144 03/07/14 0744 03/07/14 1221  GLUCAP 122* 123* 152* 106* 114*    If 7PM-7AM, please contact night-coverage [email or web address] Password [email or web address] 03/12/2014, 1:42 PM

## 2014-03-12 NOTE — ED Notes (Signed)
Pt has 2" skin tear on scrotom

## 2014-03-12 NOTE — ED Provider Notes (Signed)
CSN: [id number]     Arrival date & time 03/12/14  1047 History  First MD Initiated Contact with Patient 03/12/14 1106     Chief Complaint  Patient presents with  . Altered Mental Status    HPI Patient presents to emergency room with confusion. The patient has a history of pneumonia and patient's son states he frequently gets confused whenever he gets an infection. He was in the hospital last week for pneumonia. The patient had been home doing relatively well. He said some cough and congestion but otherwise no other significant symptoms until last evening. Patient starting having some episodes of confusion. He was hallucinating and thought he saw a garage in the front of his yard. The patient's son had to explain to him that there was no garage.  This morning the patient opened up his window and threw his sheets and bed clothes out the window. The patient's caregiver and saw him do this and called his son. He was brought to the emergency room for further evaluation. Patient says he does have some headache and some arthritis pain in his neck and shoulders. The headache is in the front of his head. He denies any fever. He has not had any trouble with vomiting or diarrhea. Denies any chest pain, shortness of breath or abdominal pain. Past Medical History  Diagnosis Date  . CAD (coronary artery disease)     s/p NSTEMI and BMS stent diagonal07/09;  Lexiscan Myoview 9/13:  EF 62%, no ischemia  . AS (aortic stenosis)     a.  Echo 2009 mean gradient 75mm HG. AVA 2.18;  b.  Echo 4/11 mild AS mean gradient 21mm HG;  c.  Echo 04/2011: Mild LVH, EF 73-71%, grade 1 diastolic dysfunction, mild aortic stenosis, mean gradient 14, mild LAE;  d. Echo 9/13: mod LVH, EF 50-55%, mild to mod AS, mean gradient 17 mmHg  . AF (paroxysmal atrial fibrillation)     Holter monitor 2.5 sec pauses  . Fatigue     CPX 11/09 VO2  14.4 (75% predicte)d, slope 35.  O2 pulse normal.  VO2 corrected for body weight 17.6.  Marland Kitchen Hypothyroidism    . Hyperlipidemia   . History of prostate cancer   . Allergic rhinitis   . DM (diabetes mellitus)     type II diet controlled  . Shingles   . Left carotid stenosis   . Vitamin B 12 deficiency   . Anemia   . Osteoarthritis   . Nephrolithiasis   . Obesity   . OSA (obstructive sleep apnea)     AHI 15 PSG 09/06/08  . Hx of colonoscopy   . Stroke   . Anxiety and depression   . PMR (polymyalgia rheumatica) 05/29/2012  . Hypogonadism male 05/29/2012  . Hyperparathyroidism 05/29/2012  . TIA (transient ischemic attack) 05/29/2012  . CKD (chronic kidney disease) stage 4, GFR 15-29 ml/min 05/29/2012  . COPD (chronic obstructive pulmonary disease) 05/29/2012  . Myocardial infarction   . Cancer     prostate ca history   Past Surgical History  Procedure Laterality Date  . Appendectomy    . Hernia repair    . Right carpal tunnel release    . Lumbar spine surgery    . Cystectomy      neck  . Prostatectomy    . Vasectomy    . Total knee arthroplasty      right  . Cholecystectomy     Family History  Problem Relation Age of Onset  .  Stroke Mother 30  . Coronary artery disease      father, brother, sister  . Alcohol abuse Father 50   History  Substance Use Topics  . Smoking status: Former Smoker -- 0.50 packs/day for 25 years    Types: Cigarettes    Quit date: 12/25/1958  . Smokeless tobacco: Former Systems developer    Types: Snuff, [name]    Quit date: 08/06/1970     Comment: quit in 1950s  . Alcohol Use: No    Review of Systems  All other systems reviewed and are negative.      Allergies  Bactrim and Horse-derived products  Home Medications   Current Outpatient Rx  Name  Route  Sig  Dispense  Refill  . Ascorbic Acid (VITAMIN C) 500 MG tablet   Oral   Take 500 mg by mouth at bedtime.          Marland Kitchen atorvastatin (LIPITOR) 10 MG tablet   Oral   Take 10 mg by mouth every morning.         . bisacodyl (DULCOLAX) 5 MG EC tablet   Oral   Take 1 tablet (5 mg total) by mouth daily as  needed for moderate constipation.   30 tablet   0   . cholecalciferol (VITAMIN D) 1000 UNITS tablet   Oral   Take 1,000 Units by mouth at bedtime.          . clopidogrel (PLAVIX) 75 MG tablet   Oral   Take 75 mg by mouth every evening.          . cyanocobalamin 500 MCG tablet   Oral   Take 500 mcg by mouth every morning.         . cyclobenzaprine (FLEXERIL) 10 MG tablet   Oral   Take 10 mg by mouth 3 (three) times daily as needed for muscle spasms.         Marland Kitchen desmopressin (DDAVP) 0.1 MG tablet   Oral   Take 0.1 mg by mouth every evening.         . folic acid (FOLVITE) 1 MG tablet   Oral   Take 1 mg by mouth every morning.          . furosemide (LASIX) 20 MG tablet   Oral   Take 20 mg by mouth 2 (two) times daily.         . hyoscyamine (LEVSIN, ANASPAZ) 0.125 MG tablet   Oral   Take 0.125 mg by mouth 4 (four) times daily as needed for cramping.          . Iron-Vitamins (GERITOL COMPLETE PO)   Oral   Take 1 tablet by mouth every morning.          Marland Kitchen levothyroxine (SYNTHROID, LEVOTHROID) 137 MCG tablet   Oral   Take 137 mcg by mouth daily before breakfast.          . meclizine (ANTIVERT) 25 MG tablet   Oral   Take 1 tablet (25 mg total) by mouth 3 (three) times daily as needed for dizziness.   30 tablet   0   . methotrexate (RHEUMATREX) 2.5 MG tablet   Oral   Take 10 mg by mouth once a week. Takes 4 tablets weekly on Wednesday.   Caution:Chemotherapy. Protect from light.         . methylPREDNISolone (MEDROL) 4 MG tablet   Oral   Take 6 mg by mouth every morning.          Marland Kitchen  midodrine (PROAMATINE) 2.5 MG tablet   Oral   Take 2.5 mg by mouth 3 (three) times daily. Take one tablet at 7 am, 2pm and at 4pm         . mirabegron ER (MYRBETRIQ) 25 MG TB24 tablet   Oral   Take 25 mg by mouth at bedtime.         . polyethylene glycol (MIRALAX / GLYCOLAX) packet   Oral   Take 17 g by mouth daily as needed for moderate constipation.   14  each   0   . risperiDONE (RISPERDAL) 0.5 MG tablet   Oral   Take 0.5-1 mg by mouth 2 (two) times daily. 1 tablet in the morning. 2 tablets at bedtime.         . sertraline (ZOLOFT) 100 MG tablet   Oral   Take 100 mg by mouth at bedtime.         . solifenacin (VESICARE) 5 MG tablet   Oral   Take 5 mg by mouth every morning.         . traMADol (ULTRAM) 50 MG tablet      Take one tablet by mouth twice daily for pain   60 tablet   5   . albuterol (PROVENTIL HFA;VENTOLIN HFA) 108 (90 BASE) MCG/ACT inhaler   Inhalation   Inhale 2 puffs into the lungs every 6 (six) hours as needed for wheezing or shortness of breath.   1 Inhaler   5   . levofloxacin (LEVAQUIN) 750 MG tablet   Oral   Take 1 tablet (750 mg total) by mouth every other day.   3 tablet   0    BP 129/66  Pulse 76  Temp(Src) 98.3 F (36.8 C)  Resp 16  SpO2 93% Physical Exam  Nursing note and vitals reviewed. Constitutional: He appears well-developed and well-nourished. No distress.  HENT:  Head: Normocephalic and atraumatic.  Right Ear: External ear normal.  Left Ear: External ear normal.  Mouth/Throat: No oropharyngeal exudate.  Eyes: Conjunctivae are normal. Pupils are equal, round, and reactive to light. Right eye exhibits no discharge. Left eye exhibits no discharge. No scleral icterus.  Pupils 2 mm bilaterally  Neck: Normal range of motion. Neck supple. No tracheal deviation present.  No pain with flexion extension and rotation of his neck  Cardiovascular: Normal rate, regular rhythm and intact distal pulses.   Pulmonary/Chest: Effort normal and breath sounds normal. No stridor. No respiratory distress. He has no wheezes. He has no rales.  Abdominal: Soft. Bowel sounds are normal. He exhibits no distension. There is no tenderness. There is no rebound and no guarding.  Musculoskeletal: He exhibits no edema and no tenderness.  Neurological: He is alert. He has normal strength. He is disoriented. No  cranial nerve deficit (no facial droop, extraocular movements intact, no slurred speech) or sensory deficit. He exhibits normal muscle tone. He displays no seizure activity. Coordination normal.  Patient falls asleep at the bedside of those he arouses immediately when I speak to him, alert and oriented to person and place but not to date  Skin: Skin is warm and dry. No rash noted.  Psychiatric: He has a normal mood and affect.    ED Course  Procedures (including critical care time) Labs Review Labs Reviewed  CBC WITH DIFFERENTIAL - Abnormal; Notable for the following:    WBC 11.2 (*)    RBC 3.70 (*)    Hemoglobin 12.7 (*)    HCT 38.9 (*)  MCV 105.1 (*)    MCH 34.3 (*)    Neutrophils Relative % 78 (*)    Neutro Abs 8.8 (*)    All other components within normal limits  COMPREHENSIVE METABOLIC PANEL - Abnormal; Notable for the following:    Glucose, Bld 127 (*)    BUN 28 (*)    Creatinine, Ser 1.62 (*)    GFR calc non Af Amer 36 (*)    GFR calc Af Amer 42 (*)    All other components within normal limits  BLOOD GAS, ARTERIAL - Abnormal; Notable for the following:    pO2, Arterial 68.5 (*)    Bicarbonate 26.6 (*)    Acid-Base Excess 2.5 (*)    All other components within normal limits  I-STAT CG4 LACTIC ACID, ED - Abnormal; Notable for the following:    Lactic Acid, Venous 2.31 (*)    All other components within normal limits  URINALYSIS, ROUTINE W REFLEX MICROSCOPIC  APTT  PROTIME-INR   Imaging Review Dg Chest 2 View  03/12/2014   CLINICAL DATA:  Shortness of breath, cough  EXAM: CHEST  2 VIEW  COMPARISON:  March 03, 2014  FINDINGS: The heart size and mediastinal contours are stable. The heart size is enlarged. The aorta is tortuous. There patchy consolidation of both lung bases increased compared to prior exam. There is no pulmonary edema or pleural effusion. The visualized skeletal structures are stable.  IMPRESSION: Increased patchy consolidation of both lung bases,  developing pneumonia not excluded.   Electronically Signed   By: Abelardo Diesel M.D.   On: 03/12/2014 12:00   Ct Head Wo Contrast  03/12/2014   CLINICAL DATA:  Altered mental status  EXAM: CT HEAD WITHOUT CONTRAST  CT CERVICAL SPINE WITHOUT CONTRAST  TECHNIQUE: Multidetector CT imaging of the head and cervical spine was performed following the standard protocol without intravenous contrast. Multiplanar CT image reconstructions of the cervical spine were also generated.  COMPARISON:  01/15/2014 and cervical spine 12/24/2012  FINDINGS: CT HEAD FINDINGS  No skull fracture is noted. Atherosclerotic calcifications of carotid siphon. The paranasal sign scattered there is mucosal thickening bilateral maxillary sinus. Mucosal thickening bilateral ethmoid air cells. The mastoid air cells are unremarkable.  Moderate cerebral atrophy again noted. Again noted periventricular and subcortical white matter decreased attenuation consistent with chronic small vessel ischemic changes. No acute cortical infarction. No mass lesion is noted on this unenhanced scan. Stable small lacunar infarct in right basal ganglia.  CT CERVICAL SPINE FINDINGS  Axial images of the cervical spine shows no acute fracture.  Computer processed images shows no acute fracture. Again noted 3.5 mm anterolisthesis C4 on C5 vertebral body. Again noted disc space flattening with mild anterior spurring at at C5-C6 level. Significant disc space flattening with endplate sclerotic changes anterior and posterior spurring at C6-C7 level. Mild disc space flattening at C7-T1 level. No prevertebral soft tissue swelling. Stable degenerative changes C1-C2 articulation. Cervical airway is patent.  IMPRESSION: 1. No acute intracranial abnormality. Stable atrophy and chronic white matter disease. 2. No cervical spine acute fracture. Stable 3.5 mm anterolisthesis C4 on C5 vertebral body. Multilevel degenerative changes as described above.   Electronically Signed   By: Lahoma Crocker M.D.   On: 03/12/2014 12:38   Ct Cervical Spine Wo Contrast  03/12/2014   CLINICAL DATA:  Altered mental status  EXAM: CT HEAD WITHOUT CONTRAST  CT CERVICAL SPINE WITHOUT CONTRAST  TECHNIQUE: Multidetector CT imaging of the head and cervical spine was  performed following the standard protocol without intravenous contrast. Multiplanar CT image reconstructions of the cervical spine were also generated.  COMPARISON:  01/15/2014 and cervical spine 12/24/2012  FINDINGS: CT HEAD FINDINGS  No skull fracture is noted. Atherosclerotic calcifications of carotid siphon. The paranasal sign scattered there is mucosal thickening bilateral maxillary sinus. Mucosal thickening bilateral ethmoid air cells. The mastoid air cells are unremarkable.  Moderate cerebral atrophy again noted. Again noted periventricular and subcortical white matter decreased attenuation consistent with chronic small vessel ischemic changes. No acute cortical infarction. No mass lesion is noted on this unenhanced scan. Stable small lacunar infarct in right basal ganglia.  CT CERVICAL SPINE FINDINGS  Axial images of the cervical spine shows no acute fracture.  Computer processed images shows no acute fracture. Again noted 3.5 mm anterolisthesis C4 on C5 vertebral body. Again noted disc space flattening with mild anterior spurring at at C5-C6 level. Significant disc space flattening with endplate sclerotic changes anterior and posterior spurring at C6-C7 level. Mild disc space flattening at C7-T1 level. No prevertebral soft tissue swelling. Stable degenerative changes C1-C2 articulation. Cervical airway is patent.  IMPRESSION: 1. No acute intracranial abnormality. Stable atrophy and chronic white matter disease. 2. No cervical spine acute fracture. Stable 3.5 mm anterolisthesis C4 on C5 vertebral body. Multilevel degenerative changes as described above.   Electronically Signed   By: Lahoma Crocker M.D.   On: 03/12/2014 12:38     EKG  Interpretation   Date/Time:  Thursday March 12 2014 11:38:22 EDT Ventricular Rate:  73 PR Interval:  155 QRS Duration: 97 QT Interval:  416 QTC Calculation: 458 R Axis:   9 Text Interpretation:  Sinus rhythm Borderline low voltage, extremity leads  atrial fibrillation is not present compared to prior ECG 5 days ago  Confirmed by Emauri Krygier  MD-J, Shawna Wearing ([id number]) on 03/12/2014 12:06:15 PM      Medications  0.9 %  sodium chloride infusion (1,000 mLs Intravenous New Bag/Given 03/12/14 1228)  ceFEPIme (MAXIPIME) 1 g in dextrose 5 % 50 mL IVPB (not administered)   Repeat BP 129/66 at 115 MDM   Final diagnoses:  HCAP (healthcare-associated pneumonia)    Patient presents to the emergency room with confusion. Family states that previously this has happened before when he's had some type of developing infection. Patient was recently hospital for treatment of pneumonia. The chest x-ray today suggests the possibility of a worsening developing bilateral pneumonia. Patient does have an elevated lactic acid level White blood cell count.  I will Start him on antibiotics to cover for healthcare associated pneumonia. I will consult the medical service regarding admission for further treatment.    Kathalene Frames, MD 03/12/14 [phone]

## 2014-03-13 DIAGNOSIS — J9600 Acute respiratory failure, unspecified whether with hypoxia or hypercapnia: Secondary | ICD-10-CM

## 2014-03-13 LAB — CBC
HCT: 36.4 % — ABNORMAL LOW (ref 39.0–52.0)
Hemoglobin: 12.1 g/dL — ABNORMAL LOW (ref 13.0–17.0)
MCH: 34.5 pg — ABNORMAL HIGH (ref 26.0–34.0)
MCHC: 33.2 g/dL (ref 30.0–36.0)
MCV: 103.7 fL — ABNORMAL HIGH (ref 78.0–100.0)
PLATELETS: 314 10*3/uL (ref 150–400)
RBC: 3.51 MIL/uL — ABNORMAL LOW (ref 4.22–5.81)
RDW: 14.2 % (ref 11.5–15.5)
WBC: 9.2 10*3/uL (ref 4.0–10.5)

## 2014-03-13 LAB — COMPREHENSIVE METABOLIC PANEL
ALK PHOS: 55 U/L (ref 39–117)
ALT: 14 U/L (ref 0–53)
AST: 23 U/L (ref 0–37)
Albumin: 3.1 g/dL — ABNORMAL LOW (ref 3.5–5.2)
BILIRUBIN TOTAL: 0.3 mg/dL (ref 0.3–1.2)
BUN: 20 mg/dL (ref 6–23)
CHLORIDE: 103 meq/L (ref 96–112)
CO2: 28 meq/L (ref 19–32)
Calcium: 8.5 mg/dL (ref 8.4–10.5)
Creatinine, Ser: 1.24 mg/dL (ref 0.50–1.35)
GFR, EST AFRICAN AMERICAN: 58 mL/min — AB (ref >90)
GFR, EST NON AFRICAN AMERICAN: 50 mL/min — AB (ref >90)
GLUCOSE: 119 mg/dL — AB (ref 70–99)
POTASSIUM: 3.6 meq/L — AB (ref 3.7–5.3)
SODIUM: 142 meq/L (ref 137–147)
Total Protein: 5.7 g/dL — ABNORMAL LOW (ref 6.0–8.3)

## 2014-03-13 LAB — URINE CULTURE
Colony Count: NO GROWTH
Culture: NO GROWTH

## 2014-03-13 LAB — LEGIONELLA ANTIGEN, URINE: Legionella Antigen, Urine: NEGATIVE

## 2014-03-13 LAB — INFLUENZA PANEL BY PCR (TYPE A & B)
H1N1FLUPCR: NOT DETECTED
INFLBPCR: NEGATIVE
Influenza A By PCR: NEGATIVE

## 2014-03-13 LAB — GLUCOSE, CAPILLARY: Glucose-Capillary: 104 mg/dL — ABNORMAL HIGH (ref 70–99)

## 2014-03-13 MED ORDER — LORAZEPAM 2 MG/ML IJ SOLN
1.0000 mg | Freq: Once | INTRAMUSCULAR | Status: AC
  Administered 2014-03-13: 1 mg via INTRAVENOUS
  Filled 2014-03-13: qty 1

## 2014-03-13 MED ORDER — POTASSIUM CHLORIDE CRYS ER 20 MEQ PO TBCR
40.0000 meq | EXTENDED_RELEASE_TABLET | Freq: Two times a day (BID) | ORAL | Status: AC
  Administered 2014-03-13: 40 meq via ORAL
  Filled 2014-03-13 (×2): qty 2

## 2014-03-13 MED ORDER — HALOPERIDOL LACTATE 5 MG/ML IJ SOLN
5.0000 mg | Freq: Once | INTRAMUSCULAR | Status: AC
  Administered 2014-03-13: 5 mg via INTRAVENOUS
  Filled 2014-03-13: qty 1

## 2014-03-13 MED ORDER — HALOPERIDOL LACTATE 5 MG/ML IJ SOLN
1.0000 mg | Freq: Once | INTRAMUSCULAR | Status: AC
  Administered 2014-03-13: 1 mg via INTRAVENOUS
  Filled 2014-03-13: qty 1

## 2014-03-13 NOTE — Evaluation (Signed)
Physical Therapy Evaluation Patient Details Name: [name] MRN: [id number] DOB: [birth year] Today's Date: 03/13/2014 Time: 5093-2671 PT Time Calculation (min): 14 min  PT Assessment / Plan / Recommendation History of Present Illness  Pt is an 80 year old male with past medical history of COPD, PMR, paroxysmal atrial fibrillation (not on anticoagulation due to risk of falls),  recent admission for COPD exacerbation, pneumonia and atrial fibrillation with RVR and admitted 03/12/2014 for feeling weak and confusion.  Pt and family report hallucinations which usually suggest pt has infection (per family).  Clinical Impression  Pt currently with functional limitations due to the deficits listed below (see PT Problem List).  Pt will benefit from skilled PT to increase their independence and safety with mobility to allow discharge to the venue listed below.  Pt with limited evaluation due to cognition and weakness.  Pt requires +2 assist for bed mobility and total assist for trunk support upon sitting EOB.  Pt not safe to stand at time due to cognition and weakness.  Pt reports animal hallucinations during session such as squirrels and snakes, also attempted to take off mittens.  Pt mostly kept eyes closed however opened upon cues but appeared to have trouble focusing as took pt a minute to find therapist's eyes.  Family agreeable to SNF at this time.     PT Assessment  Patient needs continued PT services    Follow Up Recommendations  SNF    Does the patient have the potential to tolerate intense rehabilitation      Barriers to Discharge        Equipment Recommendations  None recommended by PT    Recommendations for Other Services     Frequency      Precautions / Restrictions Precautions Precautions: Fall Restrictions Weight Bearing Restrictions: No   Pertinent Vitals/Pain No pain      Mobility  Bed Mobility Overal bed mobility: Needs Assistance Bed Mobility: Supine to Sit;Sit  to Supine Supine to sit: Total assist;+2 for physical assistance Sit to supine: Total assist;+2 for physical assistance General bed mobility comments: pt able to move LEs to EOB with tactile and verbal cues however required assist to complete transfers, total assist +2 for scooting up [place] Transfers Transfers: Sit to/from Stand Sit to Stand: +2 physical assistance;Mod assist General transfer comment: assist to rise and steady    Exercises     PT Diagnosis: Difficulty walking;Generalized weakness  PT Problem List: Decreased strength;Decreased activity tolerance;Decreased balance;Decreased mobility;Decreased safety awareness;Decreased knowledge of use of DME;Cardiopulmonary status limiting activity;Decreased cognition PT Treatment Interventions: DME instruction;Gait training;Functional mobility training;Therapeutic activities;Therapeutic exercise;Patient/family education;Neuromuscular re-education;Balance training     PT Goals(Current goals can be found in the care plan section) Acute Rehab PT Goals Patient Stated Goal: get pt well PT Goal Formulation: With patient/family Time For Goal Achievement: 03/27/14 Potential to Achieve Goals: Fair  Visit Information  Last PT Received On: 03/13/14 Assistance Needed: +2 History of Present Illness: Pt is an 80 year old male with past medical history of COPD, PMR, paroxysmal atrial fibrillation (not on anticoagulation due to risk of falls),  recent admission for COPD exacerbation, pneumonia and atrial fibrillation with RVR and admitted 03/12/2014 for feeling weak and confusion.  Pt and family report hallucinations which usually suggest pt has infection (per family).       Prior [place] expects to be discharged to:: Skilled nursing facility Living Arrangements: Spouse/significant other Available Help at Discharge: Available 24 hours/day;Personal care attendant Type  of Home: House Home Access: [place]: One level Home Equipment: [place] - 2 wheels;Cane - single point Additional Comments: lives with wife and 24/7 caregiver as wife has Alzheimers Prior Function Level of Independence: Needs assistance Gait / Transfers Assistance Needed: family reports supervision with RW for few days he was home after rehab stay prior to this admission Comments: was recently in rehab for 5 weeks Communication Communication: HOH Dominant Hand: Right    Cognition  Cognition Arousal/Alertness: Lethargic Behavior During Therapy: Restless (trying to pull off mittens and reaching for hallucinations) Overall Cognitive Status: Impaired/Different from baseline (some confusion) Area of Impairment: Orientation Orientation Level: Disoriented to;Place;Time;Situation General Comments: hallucinations of squirrels and snakes, nonsensical talking - sleeping in trees; pt was able to follow commands with time and multimodal cues    Extremity/Trunk Assessment Upper Extremity Assessment Upper Extremity Assessment: Overall WFL for tasks assessed Lower Extremity Assessment Lower Extremity Assessment: Generalized weakness   Balance Balance Overall balance assessment: Needs assistance Sitting-balance support: Bilateral upper extremity supported Sitting balance-Leahy Scale: Zero Sitting balance - Comments: sat EOB for <5 min, increased posterior trunk lean with any challenge: moving LEs, sipping water   End of Session PT - End of Session Activity Tolerance: Patient limited by fatigue Patient left: in bed;with call bell/phone within reach;with bed alarm set;with family/visitor present  GP     Jontae Sonier,KATHrine E 03/13/2014, 3:22 PM Carmelia Bake, PT, DPT 03/13/2014 Pager: [phone]

## 2014-03-13 NOTE — Progress Notes (Signed)
Clinical Social Work Department BRIEF PSYCHOSOCIAL ASSESSMENT 03/13/2014  Patient:  [name], [name]     Account Number:  [id number]     Admit date:  03/12/2014  Clinical Social Worker:  Earlie Server  Date/Time:  03/13/2014 02:00 PM  Referred by:  Physician  Date Referred:  03/13/2014 Referred for  SNF Placement   Other Referral:   Interview type:  Family Other interview type:    PSYCHOSOCIAL DATA Living Status:  FAMILY Admitted from facility:   Level of care:   Primary support name:  [employer] Primary support relationship to patient:  CHILD, ADULT Degree of support available:   Adequate    CURRENT CONCERNS Current Concerns  Post-Acute Placement   Other Concerns:    SOCIAL WORK ASSESSMENT / PLAN CSW received referral in order to assist with DC planning. Per chart review, OT recommending SNF placement. CSW met with patient and dtr at bedside. CSW introduced myself and explained role.    Patient in bed and confused. Patient has mittens on appears to be talking to himself. Patient lives at home with wife who has dementia. Patient and wife have 24 hour caregivers to ensure their safety. Dtr reports that patient has been doing well at home and usually ambulates on his own. Patient had to go to rehab for about 5 weeks and was recently DC. Family reports that patient had a good experience at [facility] and might need to more rehab at DC. CSW explained process and family aware. CSW explained that CSW could contact son Lodge) who is HCPOA to verify information.    CSW completed FL2 and faxed out. CSW will follow up with bed offers.   Assessment/plan status:  Psychosocial Support/Ongoing Assessment of Needs Other assessment/ plan:   Information/referral to community resources:   SNF information    PATIENT'S/FAMILY'S RESPONSE TO PLAN OF CARE: Patient unable to participate in assessment at this time. Dtr very engaged in assessment and worried about patient returning home. Dtr  feels that caregivers provide good care but that it would be difficult for them to manage patient and wife. Patient had a good experience at [facility] and received good rehab when he was there. Dtr understanding that CSW needed to speak with HCPOA as well. CSW called and left a message with HCPOA.       [place], [place] [phone]

## 2014-03-13 NOTE — Progress Notes (Signed)
[facility] PROGRESS NOTE  [name] XFG:[id number] DOB: Aug 01, 1923 DOA: 03/12/2014 PCP: Cathlean Cower, MD  Assessment/Plan: Acute respiratory failure with hypoxia  - secondary to HCAP considering recent hospitalization  - continue vanco and cefepime  - follow up blood culture, resp culture, legionella, strep pneumo and influenza results  - oxygen support via nasal canula to keep O2 saturation above 90%  - nebulizer treatments scheduled and as needed  Active Problems:  HCAP (healthcare-associated pneumonia)  - continue vanco and cefepime as mentioned above  - pneumonia order set in place  - follow up blood culture, resp culture, legionella, strep pneumo and influenza results  HYPOTHYROIDISM  HYPERLIPIDEMIA  - continue statin therapy  ANEMIA  - likely of chronic kidney disease  - hemoglobin 12.7 on admission  - no current indications for transfusion  HYPERTENSION, BENIGN  - start norvasc 5 mg daily and hydralazine PRN for better BP control  Depression  CKD (chronic kidney disease) stage 4, GFR 15-29 ml/min  - creatinine of admission 1.62 and subsequently 1.50 which is around patient's baseline (on recent admission creatinine 1.47)  COPD (chronic obstructive pulmonary disease)  - COPD gold alert ordered  - nebulizer treatments scheduled and as needed, steroid low dose per home regimen, abx for HCAP  Chronic steroid use  - has history of hypogonadism and PMR  PMR (polymyalgia rheumatica)  - on chronic steroids  - continue methotrexate  Anxiety and depression  - continue zoloft  Urinary incontinence  - continue myrbetriq       Consultants:  none  Procedures:  none  Antibiotics:  Vancomycin   Cefepime 3/19  HPI/Subjective: Restless and agitated.   Objective: Filed Vitals:   03/13/14 0541  BP: 187/91  Pulse: 93  Temp: 97.7 F (36.5 C)  Resp: 22    Intake/Output Summary (Last 24 hours) at 03/13/14 1526 Last data filed at 03/13/14 0500  Gross  per 24 hour  Intake 2066.67 ml  Output    400 ml  Net 1666.67 ml   There were no vitals filed for this visit.  Exam:   General:  Restless, agitated, trying to get out of bed  Cardiovascular: s1s2  Respiratory: ctab  Abdomen: soft NT NDBS+  Musculoskeletal: trace pedal edema.   Data Reviewed: Basic Metabolic Panel:  Recent Labs Lab 03/12/14 1115 03/12/14 1511 03/13/14 0448  NA 141 142 142  K 4.0 4.3 3.6*  CL 100 102 103  CO2 26 28 28   GLUCOSE 127* 136* 119*  BUN 28* 26* 20  CREATININE 1.62* 1.50* 1.24  CALCIUM 9.2 8.5 8.5  MG  --  2.2  --   PHOS  --  3.6  --    Liver Function Tests:  Recent Labs Lab 03/12/14 1115 03/12/14 1511 03/13/14 0448  AST 30 21 23   ALT 16 13 14   ALKPHOS 60 55 55  BILITOT 0.4 0.4 0.3  PROT 6.8 5.9* 5.7*  ALBUMIN 3.5 3.1* 3.1*   No results found for this basename: LIPASE, AMYLASE,  in the last 168 hours No results found for this basename: AMMONIA,  in the last 168 hours CBC:  Recent Labs Lab 03/12/14 1115 03/12/14 1511 03/13/14 0448  WBC 11.2* 8.2 9.2  NEUTROABS 8.8* 6.7  --   HGB 12.7* 11.6* 12.1*  HCT 38.9* 34.9* 36.4*  MCV 105.1* 104.5* 103.7*  PLT 336 312 314   Cardiac Enzymes: No results found for this basename: CKTOTAL, CKMB, CKMBINDEX, TROPONINI,  in the last 168 hours BNP (last  3 results)  Recent Labs  04/16/13 2327 03/03/14 1647  PROBNP 1599.0* 1635.0*   CBG:  Recent Labs Lab 03/06/14 1646 03/06/14 2144 03/07/14 0744 03/07/14 1221 03/13/14 0741  GLUCAP 123* 152* 106* 114* 104*    Recent Results (from the past 240 hour(s))  MRSA PCR SCREENING     Status: None   Collection Time    03/03/14  8:37 PM      Result Value Ref Range Status   MRSA by PCR NEGATIVE  NEGATIVE Final   Comment:            The GeneXpert MRSA Assay (FDA     approved for NASAL specimens     only), is one component of a     comprehensive MRSA colonization     surveillance program. It is not     intended to diagnose MRSA      infection nor to guide or     monitor treatment for     MRSA infections.     Studies: Dg Chest 2 View  03/12/2014   CLINICAL DATA:  Shortness of breath, cough  EXAM: CHEST  2 VIEW  COMPARISON:  March 03, 2014  FINDINGS: The heart size and mediastinal contours are stable. The heart size is enlarged. The aorta is tortuous. There patchy consolidation of both lung bases increased compared to prior exam. There is no pulmonary edema or pleural effusion. The visualized skeletal structures are stable.  IMPRESSION: Increased patchy consolidation of both lung bases, developing pneumonia not excluded.   Electronically Signed   By: Abelardo Diesel M.D.   On: 03/12/2014 12:00   Ct Head Wo Contrast  03/12/2014   CLINICAL DATA:  Altered mental status  EXAM: CT HEAD WITHOUT CONTRAST  CT CERVICAL SPINE WITHOUT CONTRAST  TECHNIQUE: Multidetector CT imaging of the head and cervical spine was performed following the standard protocol without intravenous contrast. Multiplanar CT image reconstructions of the cervical spine were also generated.  COMPARISON:  01/15/2014 and cervical spine 12/24/2012  FINDINGS: CT HEAD FINDINGS  No skull fracture is noted. Atherosclerotic calcifications of carotid siphon. The paranasal sign scattered there is mucosal thickening bilateral maxillary sinus. Mucosal thickening bilateral ethmoid air cells. The mastoid air cells are unremarkable.  Moderate cerebral atrophy again noted. Again noted periventricular and subcortical white matter decreased attenuation consistent with chronic small vessel ischemic changes. No acute cortical infarction. No mass lesion is noted on this unenhanced scan. Stable small lacunar infarct in right basal ganglia.  CT CERVICAL SPINE FINDINGS  Axial images of the cervical spine shows no acute fracture.  Computer processed images shows no acute fracture. Again noted 3.5 mm anterolisthesis C4 on C5 vertebral body. Again noted disc space flattening with mild anterior  spurring at at C5-C6 level. Significant disc space flattening with endplate sclerotic changes anterior and posterior spurring at C6-C7 level. Mild disc space flattening at C7-T1 level. No prevertebral soft tissue swelling. Stable degenerative changes C1-C2 articulation. Cervical airway is patent.  IMPRESSION: 1. No acute intracranial abnormality. Stable atrophy and chronic white matter disease. 2. No cervical spine acute fracture. Stable 3.5 mm anterolisthesis C4 on C5 vertebral body. Multilevel degenerative changes as described above.   Electronically Signed   By: Lahoma Crocker M.D.   On: 03/12/2014 12:38   Ct Cervical Spine Wo Contrast  03/12/2014   CLINICAL DATA:  Altered mental status  EXAM: CT HEAD WITHOUT CONTRAST  CT CERVICAL SPINE WITHOUT CONTRAST  TECHNIQUE: Multidetector CT imaging of the head and  cervical spine was performed following the standard protocol without intravenous contrast. Multiplanar CT image reconstructions of the cervical spine were also generated.  COMPARISON:  01/15/2014 and cervical spine 12/24/2012  FINDINGS: CT HEAD FINDINGS  No skull fracture is noted. Atherosclerotic calcifications of carotid siphon. The paranasal sign scattered there is mucosal thickening bilateral maxillary sinus. Mucosal thickening bilateral ethmoid air cells. The mastoid air cells are unremarkable.  Moderate cerebral atrophy again noted. Again noted periventricular and subcortical white matter decreased attenuation consistent with chronic small vessel ischemic changes. No acute cortical infarction. No mass lesion is noted on this unenhanced scan. Stable small lacunar infarct in right basal ganglia.  CT CERVICAL SPINE FINDINGS  Axial images of the cervical spine shows no acute fracture.  Computer processed images shows no acute fracture. Again noted 3.5 mm anterolisthesis C4 on C5 vertebral body. Again noted disc space flattening with mild anterior spurring at at C5-C6 level. Significant disc space flattening  with endplate sclerotic changes anterior and posterior spurring at C6-C7 level. Mild disc space flattening at C7-T1 level. No prevertebral soft tissue swelling. Stable degenerative changes C1-C2 articulation. Cervical airway is patent.  IMPRESSION: 1. No acute intracranial abnormality. Stable atrophy and chronic white matter disease. 2. No cervical spine acute fracture. Stable 3.5 mm anterolisthesis C4 on C5 vertebral body. Multilevel degenerative changes as described above.   Electronically Signed   By: Lahoma Crocker M.D.   On: 03/12/2014 12:38    Scheduled Meds: . amLODipine  5 mg Oral Daily  . atorvastatin  10 mg Oral q1800  . ceFEPime (MAXIPIME) IV  1 g Intravenous Q24H  . cholecalciferol  1,000 Units Oral QHS  . clopidogrel  75 mg Oral QPM  . cyanocobalamin  500 mcg Oral q morning - 10a  . darifenacin  7.5 mg Oral Daily  . desmopressin  0.1 mg Oral QPM  . folic acid  1 mg Oral q morning - 10a  . furosemide  20 mg Oral BID  . ipratropium  0.5 mg Nebulization Q6H  . levalbuterol  0.63 mg Nebulization Q6H  . levothyroxine  137 mcg Oral QAC breakfast  . [START ON 03/18/2014] methotrexate  10 mg Oral Q Wed  . methylPREDNISolone  6 mg Oral Q breakfast  . midodrine  2.5 mg Oral 3 times per day  . mirabegron ER  25 mg Oral QHS  . potassium chloride  40 mEq Oral BID  . risperiDONE  0.5 mg Oral Daily  . risperiDONE  1 mg Oral QHS  . sertraline  100 mg Oral QHS  . vancomycin  1,250 mg Intravenous Q24H  . vitamin C  500 mg Oral QHS   Continuous Infusions:   Principal Problem:   Acute respiratory failure with hypoxia Active Problems:   HYPOTHYROIDISM   HYPERLIPIDEMIA   ANEMIA   HYPERTENSION, BENIGN   Depression   CKD (chronic kidney disease) stage 4, GFR 15-29 ml/min   COPD (chronic obstructive pulmonary disease)   Chronic steroid use   Insomnia   Dementia   HCAP (healthcare-associated pneumonia)   PMR (polymyalgia rheumatica)    Time spent: 30 minutes    Antigone Crowell  [facility] Pager [phone] If 7PM-7AM, please contact night-coverage at [email or web address], password [facility] 03/13/2014, 3:26 PM  LOS: 1 day

## 2014-03-13 NOTE — Evaluation (Signed)
Occupational Therapy Evaluation Patient Details Name: [name] MRN: [id number] DOB: 1923/08/06 Today's Date: 03/13/2014 Time: 0973-5329 OT Time Calculation (min): 38 min  OT Assessment / Plan / Recommendation History of present illness pt was admitted for weakness and confusion.  He was recently discharged on 3/15 after COPD exacerbation.  Pt has h/o COPD and A Fib.   Clinical Impression   Pt was admitted for the above.  Pt had no sleep last night and is aware that he has days/nights mixed up.  He was confused at times.  Earlier, he was agitated and had hallucinations.  He had bil mitts ordered.  Able to remove both to complete evaluation.  Pt needs A x 2 for safety with mobility/adls.  Pt had recently returned home after 5 weeks of rehab and was here and discharged on 3/15. Will benefit from skilled OT to increase safety and independence with adls.  Goals in acute are set at +2 min A for safety overall for transfers and set up/supervision for UB adls.     OT Assessment  Patient needs continued OT Services    Follow Up Recommendations  SNF    Barriers to Discharge      Equipment Recommendations   (?3:1 if pt doesn't have)    Recommendations for Other Services    Frequency  Min 2X/week    Precautions / Restrictions Precautions Precautions: Fall Restrictions Weight Bearing Restrictions: No   Pertinent Vitals/Pain No pain reported    ADL  Eating/Feeding: Moderate assistance (beverage only) Where Assessed - Eating/Feeding: Bed level Grooming: Moderate assistance;Wash/dry face Where Assessed - Grooming: Supine, head of bed up Equipment Used: Rolling walker Transfers/Ambulation Related to ADLs: Pt sat at eob with A x 2 for safety.  Had a tendency to lean posteriorly.  Daughter came to visit and assisted OT.  Able to stand but unable to sidestep (see mobility section below) ADL Comments: Pt followed commands but moves slowly at time; often keeps one or both eyes closed.  Hand  over hand to initiate grooming and to hold cup.  RN reports pt spit food out earlier.  Reordered tray since pt states he is hungry.  Pt calm throughout session    OT Diagnosis: Generalized weakness;Cognitive deficits  OT Problem List: Decreased strength;Decreased activity tolerance;Impaired balance (sitting and/or standing);Decreased cognition;Decreased safety awareness OT Treatment Interventions: Self-care/ADL training;Therapeutic activities;Patient/family education;Balance training   OT Goals(Current goals can be found in the care plan section) Acute Rehab OT Goals Patient Stated Goal: get pt well OT Goal Formulation: With family Time For Goal Achievement: 03/27/14 Potential to Achieve Goals: Fair ADL Goals Pt Will Perform Eating: with set-up;with supervision;sitting Pt Will Perform Grooming: with set-up;with supervision;sitting Pt Will Perform Upper Body Bathing: with supervision;sitting Pt Will Transfer to Toilet: with +2 assist;with min assist;bedside commode;stand pivot transfer Pt Will Perform Toileting - Clothing Manipulation and hygiene: with 2+ total assist;with min assist;sit to/from stand Additional ADL Goal #1: pt will sit unsupported at eob x 5 minutes with supervision for light adl task  Visit Information  Last OT Received On: 03/13/14 Assistance Needed: +2 History of Present Illness: pt was admitted for weakness and confusion.  He was recently discharged on 3/15 after COPD exacerbation.  Pt has h/o COPD and A Fib.       Prior [place] expects to be discharged to:: Unsure Additional Comments: lives with wife and 24/7 caregiver as wife has Alzheimers Prior Function Level of Independence: Independent with  assistive device(s) Comments: was recently in rehab for 5 weeks Communication Communication: HOH Dominant Hand: Right         Vision/Perception Vision - History Baseline Vision:  (often keeps R eye closed; had both open at  times)   Cognition  Cognition Arousal/Alertness: Awake/alert Behavior During Therapy: WFL for tasks assessed/performed Overall Cognitive Status: Impaired/Different from baseline (some confusion) Area of Impairment: Orientation General Comments: pt intermittently confused and not oriented to place, time, situation.  Intermittent confusion, talking about skiing    Extremity/Trunk Assessment Upper Extremity Assessment Upper Extremity Assessment: Overall WFL for tasks assessed     Mobility Bed Mobility Overal bed mobility: Needs Assistance Bed Mobility: Supine to Sit Supine to sit: +2 for physical assistance;Max assist General bed mobility comments: assist for LEs and trunk.  Pt did initiate moving legs over to side Transfers Transfers: Sit to/from Stand Sit to Stand: +2 physical assistance;Mod assist General transfer comment: assist to rise and steady     Exercise     Balance Balance Overall balance assessment: Needs assistance Sitting-balance support: Bilateral upper extremity supported Sitting balance-Leahy Scale: Poor Sitting balance - Comments: sat eob x 5 minutes Standing balance support: Bilateral upper extremity supported Standing balance-Leahy Scale: Poor Standing balance comment: stood briefly to change sheet/pad due to incontinence   End of Session OT - End of Session Activity Tolerance: Patient limited by fatigue Patient left: in bed;with call bell/phone within reach;with bed alarm set;with restraints reapplied;with family/visitor present  GO     [name] 03/13/2014, 2:18 PM Lesle Chris, OTR/L [phone] 03/13/2014

## 2014-03-13 NOTE — Progress Notes (Signed)
Confused/agitated with intermittent hallucinations throughout the night. Minimum relief with haldol/ativan.  Bilateral mittens placed at 2200 after biting on iv line x 2. No sleep entire night shift

## 2014-03-13 NOTE — Progress Notes (Addendum)
Clinical Social Work Department CLINICAL SOCIAL WORK PLACEMENT NOTE 03/13/2014  Patient:  [name], [name]  Account Number:  [id number] Admit date:  03/12/2014  Clinical Social Worker:  Sindy Messing, LCSW  Date/time:  03/13/2014 02:45 PM  Clinical Social Work is seeking post-discharge placement for this patient at the following level of care:   [place]   (*CSW will update this form in Epic as items are completed)   03/13/2014  Patient/family provided with [place] Department of Clinical Social Work's list of facilities offering this level of care within the geographic area requested by the patient (or if unable, by the patient's family).  03/13/2014  Patient/family informed of their freedom to choose among providers that offer the needed level of care, that participate in Medicare, Medicaid or managed care program needed by the patient, have an available bed and are willing to accept the patient.  03/13/2014  Patient/family informed of [facility]' ownership interest in [facility], as well as of the fact that they are under no obligation to receive care at this facility.  PASARR submitted to EDS on Existing # PASARR number received from EDS on   FL2 transmitted to all facilities in geographic area requested by pt/family on  03/13/2014 FL2 transmitted to all facilities within larger geographic area on   Patient informed that his/her managed care company has contracts with or will negotiate with  certain facilities, including the following:     Patient/family informed of bed offers received:  03/17/14 Patient chooses bed at [facility] Physician recommends and patient chooses bed at    Patient to be transferred to [facility]  on  03/17/14 Patient to be transferred to facility by [facility]  The following physician request were entered in Epic:   Additional Comments:

## 2014-03-14 DIAGNOSIS — I10 Essential (primary) hypertension: Secondary | ICD-10-CM

## 2014-03-14 LAB — GLUCOSE, CAPILLARY: Glucose-Capillary: 101 mg/dL — ABNORMAL HIGH (ref 70–99)

## 2014-03-14 MED ORDER — AMLODIPINE BESYLATE 10 MG PO TABS
10.0000 mg | ORAL_TABLET | Freq: Every day | ORAL | Status: DC
  Administered 2014-03-15: 10 mg via ORAL
  Filled 2014-03-14: qty 1

## 2014-03-14 MED ORDER — DEXTROSE 5 % IV SOLN
1.0000 g | Freq: Two times a day (BID) | INTRAVENOUS | Status: DC
  Administered 2014-03-14 – 2014-03-17 (×7): 1 g via INTRAVENOUS
  Filled 2014-03-14 (×9): qty 1

## 2014-03-14 MED ORDER — POTASSIUM CHLORIDE CRYS ER 20 MEQ PO TBCR
40.0000 meq | EXTENDED_RELEASE_TABLET | Freq: Two times a day (BID) | ORAL | Status: AC
  Administered 2014-03-14 – 2014-03-15 (×2): 40 meq via ORAL
  Filled 2014-03-14 (×3): qty 2

## 2014-03-14 MED ORDER — AMLODIPINE BESYLATE 10 MG PO TABS
10.0000 mg | ORAL_TABLET | Freq: Once | ORAL | Status: AC
  Administered 2014-03-14: 10 mg via ORAL
  Filled 2014-03-14: qty 1

## 2014-03-14 NOTE — Progress Notes (Signed)
T/c to Pt's son.  Informed by Pt's son's wife that Pt's son is out-of-town and not available.  She suggested that I contact Pt's daughter.  LM for Pt's daughter, Mrs. Owens Shark.  Message stated that bed offers will be left in Pt's room.  CSW left CSW's contact info, should Mrs. Owens Shark have questions about bed offer list.  Bernita Raisin, [place] Work [phone]

## 2014-03-14 NOTE — Progress Notes (Signed)
03/14/14 0132  What Happened  Was fall witnessed? No  Was patient injured? No  Patient found on floor  Found by Staff-comment (RN and NT)  Stated prior activity other (comment) (rolled out of bed)  Follow Up  MD notified tom callahan NP  Time MD notified 65  Family notified Yes-comment  Time family notified 0120  Additional tests No  Simple treatment Dressing (another skin tear to RFA (pt has multiple already) )  Fall Risk Assessment  Risk Factor Category (scoring not indicated) High fall risk per protocol (document High fall risk)  Age 80  Fall History: Fall within 6 months prior to admission 0  Elimination; Bowel and/or Urine Incontinence 2  Elimination; Bowel and/or Urine Urgency/Frequency 2  Medications: includes PCA/Opiates, Anti-convulsants, Anti-hypertensives, Diuretics, Hypnotics, Laxatives, Sedatives, and Psychotropics 3  Patient Care Equipment 0  Mobility-Assistance 2  Mobility-Gait 2  Mobility-Sensory Deficit 2  Cognition-Awareness 1  Cognition-Impulsiveness 2  Cognition-Limitations 4  Total Score 23  Patient's Fall Risk High Fall Risk (>13 points)  Fall Risk Interventions  Required Bundle Interventions *See Row Information* High fall risk - low, moderate, and high requirements implemented  Additional Interventions Appropriate bed frame use with air overlay;Individualized elimination schedule;Protective devices (Comment);Reorient/diversional activities with confused patients;Specialty bed:  Low bed;Use of appropriate toileting equipment (bedpan, BSC, etc.);Secure all tubes/drains  Oxygen Therapy  O2 Device Nasal cannula  O2 Flow Rate (L/min) 2 L/min  Pain Assessment  Pain Assessment 0-10  Pain Score 0  PAINAD (Pain Assessment in Advanced Dementia)  Breathing 0  Negative Vocalization 0  Facial Expression 0  Body Language 0  Consolability 0  PAINAD Score 0  PCA/Epidural/Spinal Assessment  Respiratory Pattern Regular  Neurological  Neuro (WDL) X  Level  of Consciousness Alert  Orientation Level Disoriented to situation;Disoriented to time;Disoriented to place;Oriented to person  Cognition Appropriate at baseline;Poor attention/concentration;Poor judgement;Poor Dance movement psychotherapist Function/Sensation Assessment Grip  Facial Symmetry Symmetrical  R Hand Grip Present  L Hand Grip Present  Neuro Symptoms None  Glasgow Coma Scale  Eye Opening 3  Best Verbal Response 4  Best Motor Response 6  Glasgow Coma Scale Score 13  Musculoskeletal  Musculoskeletal (WDL) X  Assistive Device MaxiSlide  Generalized Weakness Yes  Weight Bearing Restrictions No  Integumentary  Integumentary (WDL) X  Skin Color Appropriate for ethnicity  Skin Condition Dry;Flaky  Skin Integrity Skin tear;Ecchymosis  Ecchymosis Location Arm  Ecchymosis Location Orientation Right;Left;Posterior  Skin Tear Location Arm  Skin Tear Location Orientation Right;Left  Skin Tear Intervention Cleansed;Thin film  Skin Turgor Non-tenting

## 2014-03-14 NOTE — Progress Notes (Signed)
[facility] progress note. Chief complaint. Fall. History of present illness. This 80 year old male in hospital with acute respiratory failure with  hypoxia secondary to healthcare acquired pneumonia. The patient inadvertently rolled out of bed falling on the floor. I was notified by nursing and came to see the patient at bedside. I found him alert and conversational. He denies head strike or pain. Nursing did note a skin tear on the right arm and a dressing was applied. Vital signs. Temperature 97.7, pulse 108, respiration 20, blood pressure 125/95. O2 sats 97%. General appearance. Well-developed elderly male who is alert and in no distress. Cardiac. Rate and rhythm regular. Lungs. Breath sounds are diminished in both bases but otherwise clear without distress. O2 sat stable. Abdomen. Soft with positive bowel sounds. HEENT no evidence of head trauma. No bruising or contusions. Pupils are equal. Musculoskeletal. Range of motion performed in all 4 extremities without indication of pain. Impression/plan. Problem #1. Fall without apparent injury. Per assessment at bedside I find no evidence of head strike or other injury. Staff will monitor the patient and update me of any changes as needed.

## 2014-03-14 NOTE — Progress Notes (Signed)
[facility] PROGRESS NOTE  [name] FXT:[id number] DOB: 12-Oct-1923 DOA: 03/12/2014 PCP: Cathlean Cower, MD  Assessment/Plan: Acute respiratory failure with hypoxia  - secondary to HCAP considering recent hospitalization  - continue vanco and cefepime  - follow up blood culture, resp culture, legionella, strep pneumo and influenza results  - oxygen support via nasal canula to keep O2 saturation above 90%  - nebulizer treatments scheduled and as needed  Active Problems:  HCAP (healthcare-associated pneumonia)  - continue vanco and cefepime as mentioned above  - pneumonia order set in place  - influenza negative, strepto and legionella antigen negative. UA is negative. Blood cultues negative so far. SPUTUM cultures could not be collected.  HYPOTHYROIDISM  HYPERLIPIDEMIA  - continue statin therapy  ANEMIA  - likely of chronic kidney disease  - hemoglobin 12.7 on admission  - no current indications for transfusion  HYPERTENSION, BENIGN  - start norvasc 5 mg daily and hydralazine PRN for better BP control  Depression  CKD (chronic kidney disease) stage 4, GFR 15-29 ml/min  - creatinine of admission 1.62  Which has improved to 1.32.  (on recent admission creatinine 1.47)  COPD (chronic obstructive pulmonary disease)  - COPD gold alert ordered  - nebulizer treatments scheduled and as needed, steroid low dose per home regimen, abx for HCAP  Chronic steroid use  - has history of hypogonadism and PMR  PMR (polymyalgia rheumatica)  - on chronic steroids  - continue methotrexate  Anxiety and depression  - continue zoloft  Urinary incontinence  - continue myrbetriq   Hypokalemia : Replete as needed.       Consultants:  none  Procedures:  none  Antibiotics:  Vancomycin   Cefepime 3/19  HPI/Subjective: Overnight, [name] rolled over and fell on the ground.but he was smiling and did not report any pain. He has been sleepy all day, wakign up to answer questions. He  currently denies any pain.   Objective: Filed Vitals:   03/14/14 1411  BP: 183/80  Pulse: 77  Temp: 98.2 F (36.8 C)  Resp: 20    Intake/Output Summary (Last 24 hours) at 03/14/14 1751 Last data filed at 03/14/14 1412  Gross per 24 hour  Intake      0 ml  Output   1400 ml  Net  -1400 ml   Filed Weights   03/14/14 0120  Weight: 90.1 kg (198 lb 10.2 oz)    Exam:   General:  Sleeping comfortably. Woke up to answer simple questions briefly and went back to sleep.   Cardiovascular: s1s2  Respiratory: ctab  Abdomen: soft NT NDBS+  Musculoskeletal: trace pedal edema.   Data Reviewed: Basic Metabolic Panel:  Recent Labs Lab 03/12/14 1115 03/12/14 1511 03/13/14 0448  NA 141 142 142  K 4.0 4.3 3.6*  CL 100 102 103  CO2 26 28 28   GLUCOSE 127* 136* 119*  BUN 28* 26* 20  CREATININE 1.62* 1.50* 1.24  CALCIUM 9.2 8.5 8.5  MG  --  2.2  --   PHOS  --  3.6  --    Liver Function Tests:  Recent Labs Lab 03/12/14 1115 03/12/14 1511 03/13/14 0448  AST 30 21 23   ALT 16 13 14   ALKPHOS 60 55 55  BILITOT 0.4 0.4 0.3  PROT 6.8 5.9* 5.7*  ALBUMIN 3.5 3.1* 3.1*   No results found for this basename: LIPASE, AMYLASE,  in the last 168 hours No results found for this basename: AMMONIA,  in the last 168  hours CBC:  Recent Labs Lab 03/12/14 1115 03/12/14 1511 03/13/14 0448  WBC 11.2* 8.2 9.2  NEUTROABS 8.8* 6.7  --   HGB 12.7* 11.6* 12.1*  HCT 38.9* 34.9* 36.4*  MCV 105.1* 104.5* 103.7*  PLT 336 312 314   Cardiac Enzymes: No results found for this basename: CKTOTAL, CKMB, CKMBINDEX, TROPONINI,  in the last 168 hours BNP (last 3 results)  Recent Labs  04/16/13 2327 03/03/14 1647  PROBNP 1599.0* 1635.0*   CBG:  Recent Labs Lab 03/13/14 0741 03/14/14 1059  GLUCAP 104* 101*    Recent Results (from the past 240 hour(s))  URINE CULTURE     Status: None   Collection Time    03/12/14  6:10 PM      Result Value Ref Range Status   Specimen Description  URINE, CLEAN CATCH   Final   Special Requests NONE   Final   Culture  Setup Time     Final   Value: 03/13/2014 00:35     Performed at [facility]     Final   Value: NO GROWTH     Performed at [facility]   Culture     Final   Value: NO GROWTH     Performed at [facility]   Report Status 03/13/2014 FINAL   Final  CULTURE, BLOOD (ROUTINE X 2)     Status: None   Collection Time    03/13/14  9:20 AM      Result Value Ref Range Status   Specimen Description BLOOD RIGHT ANTECUBITAL   Final   Special Requests BOTTLES DRAWN AEROBIC AND ANAEROBIC 5CC   Final   Culture  Setup Time     Final   Value: 03/13/2014 13:17     Performed at [facility]   Culture     Final   Value:        BLOOD CULTURE RECEIVED NO GROWTH TO DATE CULTURE WILL BE HELD FOR 5 DAYS BEFORE ISSUING A FINAL NEGATIVE REPORT     Performed at [facility]   Report Status PENDING   Incomplete  CULTURE, BLOOD (ROUTINE X 2)     Status: None   Collection Time    03/13/14  9:30 AM      Result Value Ref Range Status   Specimen Description BLOOD RIGHT ARM   Final   Special Requests BOTTLES DRAWN AEROBIC AND ANAEROBIC 2CC   Final   Culture  Setup Time     Final   Value: 03/13/2014 13:17     Performed at [facility]   Culture     Final   Value:        BLOOD CULTURE RECEIVED NO GROWTH TO DATE CULTURE WILL BE HELD FOR 5 DAYS BEFORE ISSUING A FINAL NEGATIVE REPORT     Performed at [facility]   Report Status PENDING   Incomplete     Studies: No results found.  Scheduled Meds: . amLODipine  5 mg Oral Daily  . atorvastatin  10 mg Oral q1800  . ceFEPime (MAXIPIME) IV  1 g Intravenous Q12H  . cholecalciferol  1,000 Units Oral QHS  . clopidogrel  75 mg Oral QPM  . cyanocobalamin  500 mcg Oral q morning - 10a  . darifenacin  7.5 mg Oral Daily  . desmopressin  0.1 mg Oral QPM  . folic acid  1 mg Oral q morning - 10a  . furosemide  20 mg Oral BID  .  ipratropium  0.5 mg Nebulization Q6H  . levalbuterol  0.63 mg Nebulization Q6H  . levothyroxine  137 mcg Oral QAC breakfast  . [START ON 03/18/2014] methotrexate  10 mg Oral Q Wed  . methylPREDNISolone  6 mg Oral Q breakfast  . mirabegron ER  25 mg Oral QHS  . risperiDONE  0.5 mg Oral Daily  . risperiDONE  1 mg Oral QHS  . sertraline  100 mg Oral QHS  . vancomycin  1,250 mg Intravenous Q24H  . vitamin C  500 mg Oral QHS   Continuous Infusions:   Principal Problem:   Acute respiratory failure with hypoxia Active Problems:   HYPOTHYROIDISM   HYPERLIPIDEMIA   ANEMIA   HYPERTENSION, BENIGN   Depression   CKD (chronic kidney disease) stage 4, GFR 15-29 ml/min   COPD (chronic obstructive pulmonary disease)   Chronic steroid use   Insomnia   Dementia   HCAP (healthcare-associated pneumonia)   PMR (polymyalgia rheumatica)    Time spent: 30 minutes    Rakin Lemelle  [facility] Pager [phone] If 7PM-7AM, please contact night-coverage at [email or web address], password [facility] 03/14/2014, 5:51 PM  LOS: 2 days

## 2014-03-15 ENCOUNTER — Inpatient Hospital Stay (HOSPITAL_COMMUNITY): Payer: Medicare Other

## 2014-03-15 LAB — BASIC METABOLIC PANEL
BUN: 25 mg/dL — ABNORMAL HIGH (ref 6–23)
CALCIUM: 8.9 mg/dL (ref 8.4–10.5)
CO2: 24 meq/L (ref 19–32)
CREATININE: 1.6 mg/dL — AB (ref 0.50–1.35)
Chloride: 102 mEq/L (ref 96–112)
GFR calc non Af Amer: 37 mL/min — ABNORMAL LOW (ref >90)
GFR, EST AFRICAN AMERICAN: 42 mL/min — AB (ref >90)
Glucose, Bld: 124 mg/dL — ABNORMAL HIGH (ref 70–99)
Potassium: 4.7 mEq/L (ref 3.7–5.3)
Sodium: 138 mEq/L (ref 137–147)

## 2014-03-15 LAB — VANCOMYCIN, TROUGH: Vancomycin Tr: 17.1 ug/mL (ref 10.0–20.0)

## 2014-03-15 LAB — GLUCOSE, CAPILLARY
Glucose-Capillary: 99 mg/dL (ref 70–99)
Glucose-Capillary: 136 mg/dL — ABNORMAL HIGH (ref 70–99)

## 2014-03-15 MED ORDER — AMLODIPINE BESYLATE 5 MG PO TABS
5.0000 mg | ORAL_TABLET | Freq: Every day | ORAL | Status: DC
  Administered 2014-03-16 – 2014-03-17 (×2): 5 mg via ORAL
  Filled 2014-03-15 (×2): qty 1

## 2014-03-15 MED ORDER — LEVALBUTEROL HCL 0.63 MG/3ML IN NEBU
0.6300 mg | INHALATION_SOLUTION | Freq: Three times a day (TID) | RESPIRATORY_TRACT | Status: DC
  Administered 2014-03-15 – 2014-03-17 (×6): 0.63 mg via RESPIRATORY_TRACT
  Filled 2014-03-15 (×14): qty 3

## 2014-03-15 MED ORDER — IPRATROPIUM BROMIDE 0.02 % IN SOLN
0.5000 mg | Freq: Three times a day (TID) | RESPIRATORY_TRACT | Status: DC
  Administered 2014-03-15 – 2014-03-17 (×6): 0.5 mg via RESPIRATORY_TRACT
  Filled 2014-03-15 (×6): qty 2.5

## 2014-03-15 NOTE — Progress Notes (Signed)
ANTIBIOTIC CONSULT NOTE - FOLLOW UP  Pharmacy Consult for Vancomycin, cefepime Indication: pneumonia  Allergies  Allergen Reactions  . Bactrim [Sulfamethoxazole-Trimethoprim] Other (See Comments)    hallucinations  . Horse-Derived Products Hives    Patient Measurements: Height: 5\' 8"  (172.7 cm) Weight: 198 lb 10.2 oz (90.1 kg) IBW/kg (Calculated) : 68.4  Vital Signs: Temp: 97.6 F (36.4 C) (03/22 0639) Temp src: Oral (03/22 0639) BP: 112/62 mmHg (03/22 0948) Pulse Rate: 68 (03/22 0639) Intake/Output from previous day: 03/21 0701 - 03/22 0700 In: -  Out: 400 [Urine:400]  Labs:  Recent Labs  03/12/14 1115 03/12/14 1511 03/13/14 0448 03/15/14 0530  WBC 11.2* 8.2 9.2  --   HGB 12.7* 11.6* 12.1*  --   PLT 336 312 314  --   CREATININE 1.62* 1.50* 1.24 1.60*   Estimated Creatinine Clearance: 34.1 ml/min (by C-G formula based on Cr of 1.6).  Recent Labs  03/15/14 1512  [place] 17.1    Microbiology: 3/19 urine: NGF 3/19 Strep pneumo and Leg Ur Ag: negative 3/20 Influenza PCR: negative 3/20 Blood: NGTD  Anti-infectives: 3/19 >> Vanc >> 3/19 >> Cefepime >>   Assessment: 50 yoM with probable HCAP, in hospital 1/22 for CAP treated with Rocephin/Azithromycin. Seen in ED 3/10 for Afib/RVR. Now with hallucinations, abnormal behavior, c/o headache, elevated lactic acid, SCr (hx of CKD). CXray suggestive of bilateral PNA.  Pharmacy consulted to dose vancomycin and cefepime.   Day #4 vancomycin and cefepime  Tmax: Afeb  WBCs: wnl (3/20, chronic medrol)  Renal: SCr 1.6, CrCl ~ 34 CG, ~ 32 N  Vancomycin trough level 17.1, within goal therapeutic range.   Goal of Therapy:  Vancomycin trough level 15-20 mcg/ml  Plan:   Continue cefepime 1g IV q12h  Continue vancomycin 1250mg  IV q24h.   Measure Vanc trough at steady state.  Follow up renal fxn and culture results.   Gretta Arab PharmD, BCPS Pager [phone] 03/15/2014 3:57 PM

## 2014-03-15 NOTE — Progress Notes (Signed)
[facility] PROGRESS NOTE  RAWLEIGH RODE HWE:[id number] DOB: [birth year] DOA: 03/12/2014 PCP: Cathlean Cower, MD  Assessment/Plan: Acute respiratory failure with hypoxia  - secondary to HCAP considering recent hospitalization  - continue vanco and cefepime , day 4 of antibiotics. Will get repeat CXR for improvement of the pneumonia.  - follow up blood culture, resp culture, legionella, strep pneumo and influenza results  - oxygen support via nasal canula to keep O2 saturation above 90%  - nebulizer treatments scheduled and as needed  Active Problems:  HCAP (healthcare-associated pneumonia)  - continue vanco and cefepime as mentioned above  - pneumonia order set in place  - influenza negative, strepto and legionella antigen negative. UA is negative. Blood cultues negative so far. SPUTUM cultures could not be collected.  HYPOTHYROIDISM  HYPERLIPIDEMIA  - continue statin therapy  ANEMIA  - likely of chronic kidney disease  - hemoglobin 12.7 on admission  - no current indications for transfusion  HYPERTENSION, BENIGN  - start norvasc 5 mg daily and hydralazine PRN for better BP control  Depression  CKD (chronic kidney disease) stage 4, GFR 15-29 ml/min  - creatinine of admission 1.62  Which has improved to 1.32.  (on recent admission creatinine 1.47)  COPD (chronic obstructive pulmonary disease)  - COPD gold alert ordered  - nebulizer treatments scheduled and as needed, steroid low dose per home regimen, abx for HCAP  Chronic steroid use  - has history of hypogonadism and PMR  PMR (polymyalgia rheumatica)  - on chronic steroids  - continue methotrexate  Anxiety and depression  - continue zoloft  Urinary incontinence  - continue myrbetriq   Hypokalemia : Replete as needed.       Consultants:  none  Procedures:  none  Antibiotics:  Vancomycin   Cefepime 3/19  HPI/Subjective: He had a good day so far. Ambulating with assistance.   Objective: Filed Vitals:    03/15/14 1314  BP: 113/62  Pulse: 68  Temp: 97.7 F (36.5 C)  Resp: 18    Intake/Output Summary (Last 24 hours) at 03/15/14 1505 Last data filed at 03/15/14 1315  Gross per 24 hour  Intake    240 ml  Output    800 ml  Net   -560 ml   Filed Weights   03/14/14 0120  Weight: 90.1 kg (198 lb 10.2 oz)    Exam:   General:  Sleeping comfortably. Woke up to answer simple questions briefly and went back to sleep.   Cardiovascular: s1s2  Respiratory: ctab  Abdomen: soft NT NDBS+  Musculoskeletal: trace pedal edema.   Data Reviewed: Basic Metabolic Panel:  Recent Labs Lab 03/12/14 1115 03/12/14 1511 03/13/14 0448 03/15/14 0530  NA 141 142 142 138  K 4.0 4.3 3.6* 4.7  CL 100 102 103 102  CO2 26 28 28 24   GLUCOSE 127* 136* 119* 124*  BUN 28* 26* 20 25*  CREATININE 1.62* 1.50* 1.24 1.60*  CALCIUM 9.2 8.5 8.5 8.9  MG  --  2.2  --   --   PHOS  --  3.6  --   --    Liver Function Tests:  Recent Labs Lab 03/12/14 1115 03/12/14 1511 03/13/14 0448  AST 30 21 23   ALT 16 13 14   ALKPHOS 60 55 55  BILITOT 0.4 0.4 0.3  PROT 6.8 5.9* 5.7*  ALBUMIN 3.5 3.1* 3.1*   No results found for this basename: LIPASE, AMYLASE,  in the last 168 hours No results found for this basename:  AMMONIA,  in the last 168 hours CBC:  Recent Labs Lab 03/12/14 1115 03/12/14 1511 03/13/14 0448  WBC 11.2* 8.2 9.2  NEUTROABS 8.8* 6.7  --   HGB 12.7* 11.6* 12.1*  HCT 38.9* 34.9* 36.4*  MCV 105.1* 104.5* 103.7*  PLT 336 312 314   Cardiac Enzymes: No results found for this basename: CKTOTAL, CKMB, CKMBINDEX, TROPONINI,  in the last 168 hours BNP (last 3 results)  Recent Labs  04/16/13 2327 03/03/14 1647  PROBNP 1599.0* 1635.0*   CBG:  Recent Labs Lab 03/13/14 0741 03/14/14 1059 03/15/14 0755  GLUCAP 104* 101* 99    Recent Results (from the past 240 hour(s))  URINE CULTURE     Status: None   Collection Time    03/12/14  6:10 PM      Result Value Ref Range Status    Specimen Description URINE, CLEAN CATCH   Final   Special Requests NONE   Final   Culture  Setup Time     Final   Value: 03/13/2014 00:35     Performed at [facility]     Final   Value: NO GROWTH     Performed at [facility]   Culture     Final   Value: NO GROWTH     Performed at [facility]   Report Status 03/13/2014 FINAL   Final  CULTURE, BLOOD (ROUTINE X 2)     Status: None   Collection Time    03/13/14  9:20 AM      Result Value Ref Range Status   Specimen Description BLOOD RIGHT ANTECUBITAL   Final   Special Requests BOTTLES DRAWN AEROBIC AND ANAEROBIC 5CC   Final   Culture  Setup Time     Final   Value: 03/13/2014 13:17     Performed at [facility]   Culture     Final   Value:        BLOOD CULTURE RECEIVED NO GROWTH TO DATE CULTURE WILL BE HELD FOR 5 DAYS BEFORE ISSUING A FINAL NEGATIVE REPORT     Performed at [facility]   Report Status PENDING   Incomplete  CULTURE, BLOOD (ROUTINE X 2)     Status: None   Collection Time    03/13/14  9:30 AM      Result Value Ref Range Status   Specimen Description BLOOD RIGHT ARM   Final   Special Requests BOTTLES DRAWN AEROBIC AND ANAEROBIC 2CC   Final   Culture  Setup Time     Final   Value: 03/13/2014 13:17     Performed at [facility]   Culture     Final   Value:        BLOOD CULTURE RECEIVED NO GROWTH TO DATE CULTURE WILL BE HELD FOR 5 DAYS BEFORE ISSUING A FINAL NEGATIVE REPORT     Performed at [facility]   Report Status PENDING   Incomplete     Studies: No results found.  Scheduled Meds: . [START ON 03/16/2014] amLODipine  5 mg Oral Daily  . atorvastatin  10 mg Oral q1800  . ceFEPime (MAXIPIME) IV  1 g Intravenous Q12H  . cholecalciferol  1,000 Units Oral QHS  . clopidogrel  75 mg Oral QPM  . cyanocobalamin  500 mcg Oral q morning - 10a  . darifenacin  7.5 mg Oral Daily  . desmopressin  0.1 mg Oral QPM  . folic acid  1 mg Oral q morning -  10a  . furosemide  20 mg Oral BID  . ipratropium  0.5 mg Nebulization TID  . levalbuterol  0.63 mg Nebulization TID  . levothyroxine  137 mcg Oral QAC breakfast  . [START ON 03/18/2014] methotrexate  10 mg Oral Q Wed  . methylPREDNISolone  6 mg Oral Q breakfast  . mirabegron ER  25 mg Oral QHS  . risperiDONE  0.5 mg Oral Daily  . risperiDONE  1 mg Oral QHS  . sertraline  100 mg Oral QHS  . vancomycin  1,250 mg Intravenous Q24H  . vitamin C  500 mg Oral QHS   Continuous Infusions:   Principal Problem:   Acute respiratory failure with hypoxia Active Problems:   HYPOTHYROIDISM   HYPERLIPIDEMIA   ANEMIA   HYPERTENSION, BENIGN   Depression   CKD (chronic kidney disease) stage 4, GFR 15-29 ml/min   COPD (chronic obstructive pulmonary disease)   Chronic steroid use   Insomnia   Dementia   HCAP (healthcare-associated pneumonia)   PMR (polymyalgia rheumatica)    Time spent: 30 minutes    Dhanvin Szeto  [facility] Pager [phone] If 7PM-7AM, please contact night-coverage at [email or web address], password [facility] 03/15/2014, 3:05 PM  LOS: 3 days

## 2014-03-16 ENCOUNTER — Inpatient Hospital Stay (HOSPITAL_COMMUNITY): Payer: Medicare Other

## 2014-03-16 LAB — GLUCOSE, CAPILLARY: Glucose-Capillary: 108 mg/dL — ABNORMAL HIGH (ref 70–99)

## 2014-03-16 MED ORDER — RESOURCE THICKENUP CLEAR PO POWD
ORAL | Status: DC | PRN
  Filled 2014-03-16: qty 125

## 2014-03-16 NOTE — Progress Notes (Signed)
Patient evaluated for [facility] Care Management. Referral received from COPD GOLD program. It appears discharge plan is for SNF.Patient will be appropriate for [facility] if disposition is for home. Confirmed likely disposition with inpatient RN case manager. Marthenia Rolling, MSN- RN,BSN- [facility] [email or web address]-[phone]

## 2014-03-16 NOTE — Progress Notes (Signed)
[facility] PROGRESS NOTE  [name] FXT:[id number] DOB: [birth year] DOA: 03/12/2014 PCP: Cathlean Cower, MD  Assessment/Plan: Acute respiratory failure with hypoxia  - secondary to HCAP considering recent hospitalization  - continue vanco and cefepime , day 4 of antibiotics. epeat CXR shows improvement of the pneumonia.  Awaiting swallow evaluation.  - follow up blood culture, resp culture, legionella, strep pneumo and influenza results all them negative so far.   - oxygen support via nasal canula to keep O2 saturation above 90%  - nebulizer treatments scheduled and as needed  Active Problems:  HCAP (healthcare-associated pneumonia)  - continue vanco and cefepime as mentioned above  - pneumonia order set in place  - influenza negative, strepto and legionella antigen negative. UA is negative. Blood cultues negative so far. SPUTUM cultures could not be collected.  HYPOTHYROIDISM  HYPERLIPIDEMIA  - continue statin therapy  ANEMIA  - likely of chronic kidney disease  - hemoglobin 12.7 on admission  - no current indications for transfusion  HYPERTENSION, BENIGN  - start norvasc 5 mg daily and hydralazine PRN for better BP control  Depression  CKD (chronic kidney disease) stage 4, GFR 15-29 ml/min  - creatinine of admission 1.62  Which has improved to 1.32.  (on recent admission creatinine 1.47)  COPD (chronic obstructive pulmonary disease)  - COPD gold alert ordered  - nebulizer treatments scheduled and as needed, steroid low dose per home regimen, abx for HCAP  Chronic steroid use  - has history of hypogonadism and PMR  PMR (polymyalgia rheumatica)  - on chronic steroids  - continue methotrexate  Anxiety and depression  - continue zoloft  Urinary incontinence  - continue myrbetriq   Hypokalemia : Replete as needed.       Consultants:  none  Procedures:  none  Antibiotics:  Vancomycin   Cefepime 3/19  HPI/Subjective: He had a good day so far.  Ambulating with assistance. Alert and eating.   Objective: Filed Vitals:   03/16/14 0608  BP: 150/76  Pulse: 67  Temp: 97.8 F (36.6 C)  Resp:     Intake/Output Summary (Last 24 hours) at 03/16/14 1400 Last data filed at 03/16/14 2992  Gross per 24 hour  Intake    240 ml  Output    800 ml  Net   -560 ml   Filed Weights   03/14/14 0120  Weight: 90.1 kg (198 lb 10.2 oz)    Exam:   General:  Sleeping comfortably. Woke up to answer simple questions briefly and went back to sleep.   Cardiovascular: s1s2  Respiratory: ctab  Abdomen: soft NT NDBS+  Musculoskeletal: trace pedal edema.   Data Reviewed: Basic Metabolic Panel:  Recent Labs Lab 03/12/14 1115 03/12/14 1511 03/13/14 0448 03/15/14 0530  NA 141 142 142 138  K 4.0 4.3 3.6* 4.7  CL 100 102 103 102  CO2 26 28 28 24   GLUCOSE 127* 136* 119* 124*  BUN 28* 26* 20 25*  CREATININE 1.62* 1.50* 1.24 1.60*  CALCIUM 9.2 8.5 8.5 8.9  MG  --  2.2  --   --   PHOS  --  3.6  --   --    Liver Function Tests:  Recent Labs Lab 03/12/14 1115 03/12/14 1511 03/13/14 0448  AST 30 21 23   ALT 16 13 14   ALKPHOS 60 55 55  BILITOT 0.4 0.4 0.3  PROT 6.8 5.9* 5.7*  ALBUMIN 3.5 3.1* 3.1*   No results found for this basename: LIPASE, AMYLASE,  in the last 168 hours No results found for this basename: AMMONIA,  in the last 168 hours CBC:  Recent Labs Lab 03/12/14 1115 03/12/14 1511 03/13/14 0448  WBC 11.2* 8.2 9.2  NEUTROABS 8.8* 6.7  --   HGB 12.7* 11.6* 12.1*  HCT 38.9* 34.9* 36.4*  MCV 105.1* 104.5* 103.7*  PLT 336 312 314   Cardiac Enzymes: No results found for this basename: CKTOTAL, CKMB, CKMBINDEX, TROPONINI,  in the last 168 hours BNP (last 3 results)  Recent Labs  04/16/13 2327 03/03/14 1647  PROBNP 1599.0* 1635.0*   CBG:  Recent Labs Lab 03/13/14 0741 03/14/14 1059 03/15/14 0755 03/15/14 2222 03/16/14 0734  GLUCAP 104* 101* 99 136* 108*    Recent Results (from the past 240 hour(s))   URINE CULTURE     Status: None   Collection Time    03/12/14  6:10 PM      Result Value Ref Range Status   Specimen Description URINE, CLEAN CATCH   Final   Special Requests NONE   Final   Culture  Setup Time     Final   Value: 03/13/2014 00:35     Performed at [facility]     Final   Value: NO GROWTH     Performed at [facility]   Culture     Final   Value: NO GROWTH     Performed at [facility]   Report Status 03/13/2014 FINAL   Final  CULTURE, BLOOD (ROUTINE X 2)     Status: None   Collection Time    03/13/14  9:20 AM      Result Value Ref Range Status   Specimen Description BLOOD RIGHT ANTECUBITAL   Final   Special Requests BOTTLES DRAWN AEROBIC AND ANAEROBIC 5CC   Final   Culture  Setup Time     Final   Value: 03/13/2014 13:17     Performed at [facility]   Culture     Final   Value:        BLOOD CULTURE RECEIVED NO GROWTH TO DATE CULTURE WILL BE HELD FOR 5 DAYS BEFORE ISSUING A FINAL NEGATIVE REPORT     Performed at [facility]   Report Status PENDING   Incomplete  CULTURE, BLOOD (ROUTINE X 2)     Status: None   Collection Time    03/13/14  9:30 AM      Result Value Ref Range Status   Specimen Description BLOOD RIGHT ARM   Final   Special Requests BOTTLES DRAWN AEROBIC AND ANAEROBIC 2CC   Final   Culture  Setup Time     Final   Value: 03/13/2014 13:17     Performed at [facility]   Culture     Final   Value:        BLOOD CULTURE RECEIVED NO GROWTH TO DATE CULTURE WILL BE HELD FOR 5 DAYS BEFORE ISSUING A FINAL NEGATIVE REPORT     Performed at [facility]   Report Status PENDING   Incomplete     Studies: Dg Chest 2 View  03/16/2014   CLINICAL DATA:  Pneumonia.  EXAM: CHEST  2 VIEW  COMPARISON:  03/12/2014.  FINDINGS: The heart is enlarged but stable. Moderate tortuosity and ectasia of the thoracic aorta is stable. Very low lung volumes with vascular crowding and bibasilar atelectasis. No  obvious infiltrates or edema. Small effusions are suspected.  IMPRESSION: Low lung  volumes with vascular crowding and bibasilar atelectasis.  Probable small bilateral pleural effusions.   Electronically Signed   By: Kalman Jewels M.D.   On: 03/16/2014 11:00    Scheduled Meds: . amLODipine  5 mg Oral Daily  . atorvastatin  10 mg Oral q1800  . ceFEPime (MAXIPIME) IV  1 g Intravenous Q12H  . cholecalciferol  1,000 Units Oral QHS  . clopidogrel  75 mg Oral QPM  . cyanocobalamin  500 mcg Oral q morning - 10a  . darifenacin  7.5 mg Oral Daily  . desmopressin  0.1 mg Oral QPM  . folic acid  1 mg Oral q morning - 10a  . furosemide  20 mg Oral BID  . ipratropium  0.5 mg Nebulization TID  . levalbuterol  0.63 mg Nebulization TID  . levothyroxine  137 mcg Oral QAC breakfast  . [START ON 03/18/2014] methotrexate  10 mg Oral Q Wed  . methylPREDNISolone  6 mg Oral Q breakfast  . mirabegron ER  25 mg Oral QHS  . risperiDONE  0.5 mg Oral Daily  . sertraline  100 mg Oral QHS  . vancomycin  1,250 mg Intravenous Q24H  . vitamin C  500 mg Oral QHS   Continuous Infusions:   Principal Problem:   Acute respiratory failure with hypoxia Active Problems:   HYPOTHYROIDISM   HYPERLIPIDEMIA   ANEMIA   HYPERTENSION, BENIGN   Depression   CKD (chronic kidney disease) stage 4, GFR 15-29 ml/min   COPD (chronic obstructive pulmonary disease)   Chronic steroid use   Insomnia   Dementia   HCAP (healthcare-associated pneumonia)   PMR (polymyalgia rheumatica)    Time spent: 20 minutes    [name]  [facility] Pager [phone] If 7PM-7AM, please contact night-coverage at [email or web address], password [facility] 03/16/2014, 2:00 PM  LOS: 4 days

## 2014-03-16 NOTE — Progress Notes (Signed)
Clinical Social Work  CSW attempted to call son several times to discuss DC plans. Son's phone number continues to ring busy. CSW will continue to follow.  [place], [place] [phone]

## 2014-03-16 NOTE — Progress Notes (Signed)
Occupational Therapy Treatment Patient Details Name: [name] MRN: [id number] DOB: September 23, 1923 Today's Date: 03/16/2014 Time: 1517-6160 OT Time Calculation (min): 25 min  OT Assessment / Plan / Recommendation  History of present illness Pt is an 80 year old male with past medical history of COPD, PMR, paroxysmal atrial fibrillation (not on anticoagulation due to risk of falls),  recent admission for COPD exacerbation, pneumonia and atrial fibrillation with RVR and admitted 03/12/2014 for feeling weak and confusion.  Pt and family report hallucinations which usually suggest pt has infection (per family).      Follow Up Recommendations  SNF             Frequency Min 2X/week   Progress towards OT Goals Progress towards OT goals: Progressing toward goals     Precautions / Restrictions Precautions Precautions: Fall Restrictions Weight Bearing Restrictions: No       ADL  Eating/Feeding: Minimal assistance Where Assessed - Eating/Feeding: Bed level (HOB raised) ADL Comments: Focused on self feeding. Pt needed increased time- moves slowly- but is able to perform task with increased time and min A      OT Goals(current goals can now be found in the care plan section)    Visit Information  Last OT Received On: 03/16/14 Assistance Needed: +2 History of Present Illness: Pt is an 80 year old male with past medical history of COPD, PMR, paroxysmal atrial fibrillation (not on anticoagulation due to risk of falls),  recent admission for COPD exacerbation, pneumonia and atrial fibrillation with RVR and admitted 03/12/2014 for feeling weak and confusion.  Pt and family report hallucinations which usually suggest pt has infection (per family).          Cognition  Cognition Arousal/Alertness: Awake/alert Behavior During Therapy: WFL for tasks assessed/performed Overall Cognitive Status: Within Functional Limits for tasks assessed    Mobility  Bed Mobility Overal bed mobility: Needs  Assistance Bed Mobility: Sit to Supine Sit to supine: Mod assist General bed mobility comments: Assist for bil LEs onto bed. Pt preferred to lie on his R side.  Transfers Overall transfer level: Needs assistance Equipment used: Rolling walker (2 wheeled) Transfers: Sit to/from Stand Sit to Stand: Mod assist;+2 safety/equipment;+2 physical assistance General transfer comment: Assist to rise, stabilize, control descent. VCs safety, technique, hand placement       Balance Balance Standing balance support: During functional activity;Bilateral upper extremity supported Standing balance-Leahy Scale: Poor  End of Session OT - End of Session Activity Tolerance: Patient tolerated treatment well Patient left: in bed;with call bell/phone within reach;with bed alarm set;with restraints reapplied;with family/visitor present  GO     [place], Thereasa Parkin 03/16/2014, 12:41 PM

## 2014-03-16 NOTE — Progress Notes (Signed)
Clinical Social Work  CSW went to room and no family present and patient sleeping. CSW was able to leave a message on son's voicemail. CSW will continue to follow.  [place], [place] [phone]

## 2014-03-16 NOTE — Progress Notes (Signed)
[facility]  Patient Status: Active (receiving services up to time of hospitalization)  [facility] is providing the following services: PT  If patient discharges after hours, please call [phone].   [name] 03/16/2014, 3:10 PM

## 2014-03-16 NOTE — Progress Notes (Signed)
Physical Therapy Treatment Patient Details Name: [name] MRN: [id number] DOB: 05-Dec-1923 Today's Date: 03/16/2014 Time: 9323-5573 PT Time Calculation (min): 8 min  PT Assessment / Plan / Recommendation  History of Present Illness Pt is an 80 year old male with past medical history of COPD, PMR, paroxysmal atrial fibrillation (not on anticoagulation due to risk of falls),  recent admission for COPD exacerbation, pneumonia and atrial fibrillation with RVR and admitted 03/12/2014 for feeling weak and confusion.  Pt and family report hallucinations which usually suggest pt has infection (per family).   PT Comments   Able to initiate gait training this session. Pt still lethargic but able to participate. Pt did not wish to ambulate any farther and requested to return to bed.   Follow Up Recommendations  SNF     Does the patient have the potential to tolerate intense rehabilitation     Barriers to Discharge        Equipment Recommendations  None recommended by PT    Recommendations for Other Services    Frequency Min 3X/week   Progress towards PT Goals Progress towards PT goals: Progressing toward goals (slowly)  Plan Current plan remains appropriate    Precautions / Restrictions Precautions Precautions: Fall Restrictions Weight Bearing Restrictions: No   Pertinent Vitals/Pain No c/o pain    Mobility  Bed Mobility Overal bed mobility: Needs Assistance Bed Mobility: Sit to Supine Sit to supine: Mod assist General bed mobility comments: Assist for bil LEs onto bed. Pt preferred to lie on his R side.  Transfers Overall transfer level: Needs assistance Equipment used: Rolling walker (2 wheeled) Transfers: Sit to/from Stand Sit to Stand: Mod assist;+2 safety/equipment;+2 physical assistance General transfer comment: Assist to rise, stabilize, control descent. VCs safety, technique, hand placement Ambulation/Gait Ambulation/Gait assistance: Mod assist;+2 physical  assistance;+2 safety/equipment Ambulation Distance (Feet): 15 Feet Assistive device: Rolling walker (2 wheeled) Gait Pattern/deviations: Decreased stride length;Trunk flexed;Decreased step length - left;Decreased step length - right;Step-through pattern;Step-to pattern General Gait Details: Assist to stabilize/support pt in standing while ambulating. Fatigues very easily. Very unstead and high risk for falls.     Exercises     PT Diagnosis:    PT Problem List:   PT Treatment Interventions:     PT Goals (current goals can now be found in the care plan section)    Visit Information  Last PT Received On: 03/16/14 Assistance Needed: +2 History of Present Illness: Pt is an 80 year old male with past medical history of COPD, PMR, paroxysmal atrial fibrillation (not on anticoagulation due to risk of falls),  recent admission for COPD exacerbation, pneumonia and atrial fibrillation with RVR and admitted 03/12/2014 for feeling weak and confusion.  Pt and family report hallucinations which usually suggest pt has infection (per family).    Subjective Data      Cognition  Cognition Arousal/Alertness: Lethargic Behavior During Therapy: Flat affect    Balance  Balance Standing balance support: During functional activity;Bilateral upper extremity supported Standing balance-Leahy Scale: Poor  End of Session PT - End of Session Equipment Utilized During Treatment: Gait belt Activity Tolerance: Patient limited by fatigue Patient left: in bed;with call bell/phone within reach;with bed alarm set   GP     Weston Anna, MPT Pager: [phone]

## 2014-03-16 NOTE — Evaluation (Signed)
Clinical/Bedside Swallow Evaluation Patient Details  Name: [name] MRN: [id number] Date of Birth: 1923/01/15  Today's Date: 03/16/2014 Time: 5573-2202 SLP Time Calculation (min): 41 min  Past Medical History:  Past Medical History  Diagnosis Date  . CAD (coronary artery disease)     s/p NSTEMI and BMS stent diagonal07/09;  Lexiscan Myoview 9/13:  EF 62%, no ischemia  . AS (aortic stenosis)     a.  Echo 2009 mean gradient 76mm HG. AVA 2.18;  b.  Echo 4/11 mild AS mean gradient 45mm HG;  c.  Echo 04/2011: Mild LVH, EF 54-27%, grade 1 diastolic dysfunction, mild aortic stenosis, mean gradient 14, mild LAE;  d. Echo 9/13: mod LVH, EF 50-55%, mild to mod AS, mean gradient 17 mmHg  . AF (paroxysmal atrial fibrillation)     Holter monitor 2.5 sec pauses  . Fatigue     CPX 11/09 VO2  14.4 (75% predicte)d, slope 35.  O2 pulse normal.  VO2 corrected for body weight 17.6.  Marland Kitchen Hypothyroidism   . Hyperlipidemia   . History of prostate cancer   . Allergic rhinitis   . DM (diabetes mellitus)     type II diet controlled  . Shingles   . Left carotid stenosis   . Vitamin B 12 deficiency   . Anemia   . Osteoarthritis   . Nephrolithiasis   . Obesity   . OSA (obstructive sleep apnea)     AHI 15 PSG 09/06/08  . Hx of colonoscopy   . Stroke   . Anxiety and depression   . PMR (polymyalgia rheumatica) 05/29/2012  . Hypogonadism male 05/29/2012  . Hyperparathyroidism 05/29/2012  . TIA (transient ischemic attack) 05/29/2012  . CKD (chronic kidney disease) stage 4, GFR 15-29 ml/min 05/29/2012  . COPD (chronic obstructive pulmonary disease) 05/29/2012  . Myocardial infarction   . Cancer     prostate ca history   Past Surgical History:  Past Surgical History  Procedure Laterality Date  . Appendectomy    . Hernia repair    . Right carpal tunnel release    . Lumbar spine surgery    . Cystectomy      neck  . Prostatectomy    . Vasectomy    . Total knee arthroplasty      right  . Cholecystectomy      HPI:  80 yo male adm to [facility] with acute respiratory issues - Pt recently admitted to the hospital for pulmonary issues, medical hx incluces COPD, HCAP, depression, PMR, anxiety, urinary incontinence.  Pt admits to h/o "mini stroke" that he states did not impact his swallow ability.  Imaging studies found anterior spurring at C5-C6.  He is currenlty on a regular diet and is a full code.  MD ordered bedside swallow evaluation.  Pt admits to occasional issues with swallowing including sensation of stasis in pharynx with foods and "strangling" on liquids.  Pt states he was "told" he had problems swallowing but was not given strategies or dietary changes to help mitigate dysphagia.  He further admits to sore throat that started 2 days ago but denies any reflux symptoms.     Assessment / Plan / Recommendation Clinical Impression  Pt presents with cough at baseline  - nonproductive but appeared dry-observed x1.  Cough also noted twice with intake - once after thin, once after nectar liquid swallow - suspect due to decreased oral control, premature spillage into pharynx/larynx.  Pt with slow but effective mastication of cracker - he  used applejuice to moisten dry cracker to accommodate his xerostomia.    He also performs exhalation after swallow during SLP observation - which will aid in maximizing his airway protection.  Pt reports issues with "strangling" on liquids and sensation of food lodging in pharynx.  Note he has cervical osteophytes at c5-c6 that may impair UES clearance.    Advised pt to masticate food to "mush" and take small single sips for better tolerance.  Suspect pt may episodically aspirate due to his respiratory status (pt denies chronc lung dx).    Wrote order for thickener to be ordered for pt to use prn if decreased cough.    Pt educated thoroughly to findings, recommendations using teach back and written precautions for his information.  Note results of CXR today.   SLP to follow up next  date to determine tolerance of diet and indication for further swallow testing.      Aspiration Risk  Moderate    Diet Recommendation Regular;Thin liquid;Nectar-thick liquid (use thickener prn)   Liquid Administration via: Cup;Straw Medication Administration: Whole meds with puree Supervision: Patient able to self feed Compensations: Slow rate;Small sips/bites;Check for pocketing Postural Changes and/or Swallow Maneuvers: Seated upright 90 degrees;Upright 30-60 min after meal    Other  Recommendations Oral Care Recommendations: Oral care before and after PO   Follow Up Recommendations   tbd    Frequency and Duration min 2x/week  1 week   Pertinent Vitals/Pain Afebrile, decreased-non productive cough-intake listed as 100%      Swallow Study Prior Functional Status   see hhx    General Date of Onset: 03/16/14 HPI: 80 yo male adm to [facility] with acute respiratory issues - Pt recently admitted to the hospital for pulmonary issues, medical hx incluces COPD, HCAP, depression, PMR, anxiety, urinary incontinence.  Pt admits to h/o "mini stroke" that he states did not impact his swallow ability.  Imaging studies found anterior spurring at C5-C6.  He is currenlty on a regular diet and is a full code.  MD ordered bedside swallow evaluation.  Pt admits to occasional issues with swallowing including sensation of stasis in pharynx with foods and "strangling" on liquids.  Pt states he was "told" he had problems swallowing but was not given strategies or dietary changes to help mitigate dysphagia.  He further admits to sore throat that started 2 days ago but denies any reflux symptoms.   Type of Study: Bedside swallow evaluation Diet Prior to this Study: Regular;Thin liquids Temperature Spikes Noted: No Respiratory Status: Nasal cannula History of Recent Intubation: No Behavior/Cognition: Alert;Cooperative;Pleasant mood Oral Cavity - Dentition: Adequate natural dentition Self-Feeding Abilities:  Able to feed self Patient Positioning: Upright in bed Baseline Vocal Quality: Clear Volitional Cough: Strong Volitional Swallow: Able to elicit    Oral/Motor/Sensory Function Overall Oral Motor/Sensory Function: Appears within functional limits for tasks assessed   Ice Chips Ice chips: Not tested   Thin Liquid Thin Liquid: Impaired Presentation: Straw;Cup Oral Phase Impairments: Reduced lingual movement/coordination Pharyngeal  Phase Impairments: Cough - Immediate Other Comments: pt tends to take large sequential boluses but does exhale after swallowing - cough x1 with intake of thin - approx 3 ounces    Nectar Thick Nectar Thick Liquid: Impaired Presentation: Cup;Straw;Self Fed Oral Phase Impairments: Reduced lingual movement/coordination Other Comments: cough x1 after intake - 4 ounces consumed   Honey Thick Honey Thick Liquid: Not tested   Puree Puree: Not tested   Solid   GO    Solid: Impaired Presentation:  Self Fed Oral Phase Impairments: Impaired mastication;Impaired anterior to posterior transit;Reduced lingual movement/coordination Oral Phase Functional Implications: Other (comment) (delayed oral transting) Pharyngeal Phase Impairments: Suspected delayed Swallow Other Comments: slow to masticate, uses thickened juice to aid oral transiting       Claudie Fisherman, [place] [facility] SLP [phone]

## 2014-03-17 LAB — CBC WITH DIFFERENTIAL/PLATELET
Basophils Absolute: 0 10*3/uL (ref 0.0–0.1)
Basophils Relative: 0 % (ref 0–1)
EOS PCT: 2 % (ref 0–5)
Eosinophils Absolute: 0.3 10*3/uL (ref 0.0–0.7)
HCT: 35.6 % — ABNORMAL LOW (ref 39.0–52.0)
Hemoglobin: 12 g/dL — ABNORMAL LOW (ref 13.0–17.0)
LYMPHS ABS: 2.2 10*3/uL (ref 0.7–4.0)
LYMPHS PCT: 14 % (ref 12–46)
MCH: 34.6 pg — ABNORMAL HIGH (ref 26.0–34.0)
MCHC: 33.7 g/dL (ref 30.0–36.0)
MCV: 102.6 fL — AB (ref 78.0–100.0)
Monocytes Absolute: 1 10*3/uL (ref 0.1–1.0)
Monocytes Relative: 6 % (ref 3–12)
Neutro Abs: 12.1 10*3/uL — ABNORMAL HIGH (ref 1.7–7.7)
Neutrophils Relative %: 78 % — ABNORMAL HIGH (ref 43–77)
Platelets: 325 10*3/uL (ref 150–400)
RBC: 3.47 MIL/uL — AB (ref 4.22–5.81)
RDW: 14.4 % (ref 11.5–15.5)
WBC: 15.6 10*3/uL — AB (ref 4.0–10.5)

## 2014-03-17 LAB — BASIC METABOLIC PANEL
BUN: 32 mg/dL — ABNORMAL HIGH (ref 6–23)
CALCIUM: 9 mg/dL (ref 8.4–10.5)
CO2: 26 meq/L (ref 19–32)
Chloride: 102 mEq/L (ref 96–112)
Creatinine, Ser: 1.44 mg/dL — ABNORMAL HIGH (ref 0.50–1.35)
GFR calc Af Amer: 48 mL/min — ABNORMAL LOW (ref >90)
GFR calc non Af Amer: 42 mL/min — ABNORMAL LOW (ref >90)
Glucose, Bld: 107 mg/dL — ABNORMAL HIGH (ref 70–99)
POTASSIUM: 3.9 meq/L (ref 3.7–5.3)
SODIUM: 139 meq/L (ref 137–147)

## 2014-03-17 LAB — GLUCOSE, CAPILLARY: GLUCOSE-CAPILLARY: 101 mg/dL — AB (ref 70–99)

## 2014-03-17 MED ORDER — RESOURCE THICKENUP CLEAR PO POWD: ORAL | Status: DC

## 2014-03-17 MED ORDER — LEVOFLOXACIN 500 MG PO TABS: 500.0000 mg | ORAL_TABLET | Freq: Every day | ORAL | Status: DC

## 2014-03-17 MED ORDER — TRAMADOL HCL 50 MG PO TABS: ORAL_TABLET | ORAL | Status: DC

## 2014-03-17 MED ORDER — AMLODIPINE BESYLATE 5 MG PO TABS: 5.0000 mg | ORAL_TABLET | Freq: Every day | ORAL | Status: DC

## 2014-03-17 MED ORDER — RISPERIDONE 0.5 MG PO TABS: 0.5000 mg | ORAL_TABLET | Freq: Every day | ORAL | Status: DC

## 2014-03-17 NOTE — Discharge Summary (Signed)
Physician Discharge Summary  [name]:[id number] DOB: Jan 15, 1923 DOA: 03/12/2014  PCP: Cathlean Cower, MD  Admit date: 03/12/2014 Discharge date: 03/17/2014  Time spent: 30 minutes  Recommendations for Outpatient Follow-up:  Follow up with PCP IN one week Follow up with CBC in one week to evaluate for resolution of leukocytosis.   Discharge Diagnoses:  Principal Problem:   Acute respiratory failure with hypoxia Active Problems:   HYPOTHYROIDISM   HYPERLIPIDEMIA   ANEMIA   HYPERTENSION, BENIGN   Depression   CKD (chronic kidney disease) stage 4, GFR 15-29 ml/min   COPD (chronic obstructive pulmonary disease)   Chronic steroid use   Insomnia   Dementia   HCAP (healthcare-associated pneumonia)   PMR (polymyalgia rheumatica)   Discharge Condition: improved.   Diet recommendation: low sodium diet  Filed Weights   03/14/14 0120 03/16/14 1000 03/17/14 0700  Weight: 90.1 kg (198 lb 10.2 oz) 90.9 kg (200 lb 6.4 oz) 90.6 kg (199 lb 11.8 oz)    History of present illness:  80 year old male with past medical history of COPD, PMR, paroxysmal atrial fibrillation (not on anticoagulation due to risk of falls), CKD, hypothyroidism, CAD, dyslipidemia, recent admission for COPD exacerbation, pneumonia and atrial fibrillation with RVR (discharged 03/08/2014) who presented to [facility] ED. 03/12/2014 feeling weak and confused. Pt reported he had hallucinations and thought he is getting an infection (which he reported usually happens to him when he gets an infection, he hallucinates). He reports having cough and congestion for quite some time, and most recently felt feverish. No chest pain, no palpitations. He had shortness of breath. No abdominal pain, nausea or vomiting. No lightheadedness or loss of consciousness. No falls.  In ED, his BP was 97/49, HR 63, Tmax 98.3 F and oxygen saturation 93% on 2 L [place] oxygen support. Blood work revealed WBC count of 11.2, hemoglobin 12.7 and creatinine of 1.62. CXR  showed increased patchy consolidation of both lung bases, developing pneumonia not excluded. CT head and cervical spine did not reveal acute intracranial findings. Pt was started on cefepime and vanco for possible HCAP.   Hospital Course:  Acute respiratory failure with hypoxia  - secondary to HCAP considering recent hospitalization  - he finished 5 days of vancomycin and cefepime. repeat CXR shows improvement of the pneumonia. He will continue levaquin for 3 more days to complete the course.  - weaned him off oxygen. PT eval recommended SNF.  - SWALLOW evaluation recommended regular diet with nectar thick liquid.   HCAP (healthcare-associated pneumonia)  - RECEIVED 5 DAYS OF vancomycin and cefepime and will be discharged on 3 more days of levaquin to complete the course.  - influenza negative, strepto and legionella antigen negative. UA is negative. Blood cultues negative so far. SPUTUM cultures could not be collected.  HYPOTHYROIDISM  RESUME SYNTHROID. HYPERLIPIDEMIA  - continue statin therapy  ANEMIA  - likely of chronic kidney disease  - hemoglobin 12.7 on admission  - no current indications for transfusion  HYPERTENSION, BENIGN  - start norvasc 5 mg daily for better BP control   CKD (chronic kidney disease) stage 4, GFR 15-29 ml/min  - creatinine of admission 1.62 Which has improved to 1.4. (on recent admission creatinine 1.47)  COPD (chronic obstructive pulmonary disease)  - COPD gold alert ordered  - nebulizer treatments scheduled and as needed, steroid low dose per home regimen, abx for HCAP  Chronic steroid use  - has history of hypogonadism and PMR  PMR (polymyalgia rheumatica)  -  on chronic steroids  - continue methotrexate  Anxiety and depression  - continue zoloft  Urinary incontinence  - continue myrbetriq  Hypokalemia :  Replete as needed.   Leukocytosis: probably related to steroids.  - suspect he is non compliant to medications at home and we have started  giving him methypred from admission. He is afebrile and his pneumonia has resolved.        Procedures:  CXR    Consultations:  none  Discharge Exam: Filed Vitals:   03/17/14 0929  BP: 143/81  Pulse: 81  Temp: 97.9 F (36.6 C)  Resp: 18   General: alert afebrile comfortable  Cardiovascular: s1s2 Respiratory: ctab Abdomen: soft NT NDBS+ Musculoskeletal: trace pedal edema.     Discharge Instructions      Discharge Orders   Future Appointments Provider Department Dept Phone   03/18/2014 3:15 PM Cassandria Anger, MD [facility] [phone]   Future Orders Complete By Expires   Diet - low sodium heart healthy  As directed    Discharge instructions  As directed    Comments:     Follow up with PCP in one week.       Medication List    STOP taking these medications       cyclobenzaprine 10 MG tablet  Commonly known as:  FLEXERIL     midodrine 2.5 MG tablet  Commonly known as:  PROAMATINE      TAKE these medications       albuterol 108 (90 BASE) MCG/ACT inhaler  Commonly known as:  PROVENTIL HFA;VENTOLIN HFA  Inhale 2 puffs into the lungs every 6 (six) hours as needed for wheezing or shortness of breath.     amLODipine 5 MG tablet  Commonly known as:  NORVASC  Take 1 tablet (5 mg total) by mouth daily.     atorvastatin 10 MG tablet  Commonly known as:  LIPITOR  Take 10 mg by mouth every morning.     bisacodyl 5 MG EC tablet  Commonly known as:  DULCOLAX  Take 1 tablet (5 mg total) by mouth daily as needed for moderate constipation.     cholecalciferol 1000 UNITS tablet  Commonly known as:  VITAMIN D  Take 1,000 Units by mouth at bedtime.     clopidogrel 75 MG tablet  Commonly known as:  PLAVIX  Take 75 mg by mouth every evening.     cyanocobalamin 500 MCG tablet  Take 500 mcg by mouth every morning.     desmopressin 0.1 MG tablet  Commonly known as:  DDAVP  Take 0.1 mg by mouth every evening.     folic acid 1  MG tablet  Commonly known as:  FOLVITE  Take 1 mg by mouth every morning.     furosemide 20 MG tablet  Commonly known as:  LASIX  Take 20 mg by mouth 2 (two) times daily.     GERITOL COMPLETE PO  Take 1 tablet by mouth every morning.     hyoscyamine 0.125 MG tablet  Commonly known as:  LEVSIN, ANASPAZ  Take 0.125 mg by mouth 4 (four) times daily as needed for cramping.     levofloxacin 500 MG tablet  Commonly known as:  LEVAQUIN  Take 1 tablet (500 mg total) by mouth daily.     levothyroxine 137 MCG tablet  Commonly known as:  SYNTHROID, LEVOTHROID  Take 137 mcg by mouth daily before breakfast.     meclizine 25 MG tablet  Commonly known as:  ANTIVERT  Take 1 tablet (25 mg total) by mouth 3 (three) times daily as needed for dizziness.     methotrexate 2.5 MG tablet  Commonly known as:  RHEUMATREX  Take 10 mg by mouth once a week. Takes 4 tablets weekly on Wednesday.   Caution:Chemotherapy. Protect from light.     methylPREDNISolone 4 MG tablet  Commonly known as:  MEDROL  Take 6 mg by mouth every morning.     mirabegron ER 25 MG Tb24 tablet  Commonly known as:  MYRBETRIQ  Take 25 mg by mouth at bedtime.     polyethylene glycol packet  Commonly known as:  MIRALAX / GLYCOLAX  Take 17 g by mouth daily as needed for moderate constipation.     RESOURCE THICKENUP CLEAR Powd  As recommended     risperiDONE 0.5 MG tablet  Commonly known as:  RISPERDAL  Take 1 tablet (0.5 mg total) by mouth at bedtime.     sertraline 100 MG tablet  Commonly known as:  ZOLOFT  Take 100 mg by mouth at bedtime.     solifenacin 5 MG tablet  Commonly known as:  VESICARE  Take 5 mg by mouth every morning.     traMADol 50 MG tablet  Commonly known as:  ULTRAM  Take one tablet by mouth twice daily for pain     vitamin C 500 MG tablet  Commonly known as:  ASCORBIC ACID  Take 500 mg by mouth at bedtime.       Allergies  Allergen Reactions  . Bactrim [Sulfamethoxazole-Trimethoprim]  Other (See Comments)    hallucinations  . Horse-Derived Products Hives   Follow-up Information   Follow up with Cathlean Cower, MD. Schedule an appointment as soon as possible for a visit in 1 week.   Specialties:  Internal Medicine, Radiology   Contact information:   [address] [place] [address] [phone]        The results of significant diagnostics from this hospitalization (including imaging, microbiology, ancillary and laboratory) are listed below for reference.    Significant Diagnostic Studies: Dg Chest 2 View  03/16/2014   CLINICAL DATA:  Pneumonia.  EXAM: CHEST  2 VIEW  COMPARISON:  03/12/2014.  FINDINGS: The heart is enlarged but stable. Moderate tortuosity and ectasia of the thoracic aorta is stable. Very low lung volumes with vascular crowding and bibasilar atelectasis. No obvious infiltrates or edema. Small effusions are suspected.  IMPRESSION: Low lung volumes with vascular crowding and bibasilar atelectasis.  Probable small bilateral pleural effusions.   Electronically Signed   By: Kalman Jewels M.D.   On: 03/16/2014 11:00   Dg Chest 2 View  03/12/2014   CLINICAL DATA:  Shortness of breath, cough  EXAM: CHEST  2 VIEW  COMPARISON:  March 03, 2014  FINDINGS: The heart size and mediastinal contours are stable. The heart size is enlarged. The aorta is tortuous. There patchy consolidation of both lung bases increased compared to prior exam. There is no pulmonary edema or pleural effusion. The visualized skeletal structures are stable.  IMPRESSION: Increased patchy consolidation of both lung bases, developing pneumonia not excluded.   Electronically Signed   By: Abelardo Diesel M.D.   On: 03/12/2014 12:00   Dg Chest 2 View  03/03/2014   CLINICAL DATA Dyspnea and cough with history of coronary artery disease COPD and diabetes  EXAM CHEST  2 VIEW  COMPARISON DG CHEST 1V PORT dated 01/15/2014; DG CHEST  2 VIEW dated 01/09/2014  FINDINGS The lungs are mildly hyperinflated.  There is no focal infiltrate. There is stable mild shift of the mediastinum towards the right. There is minimal stable density along the lateral aspect of the left hemithorax consistent with fibrosis. Density in the right lateral costophrenic angle is slightly more conspicuous today. There is stable linear density projecting over the lower thoracic spine consistent with fibrosis. The cardiac silhouette is top-normal in size but stable. The pulmonary vascularity is not engorged. The mediastinum is normal in width. The observed portions of the bony thorax appear normal.  IMPRESSION 1. There are extensive chronic changes consistent with COPD and pulmonary fibrosis. Density at the left lung base is persistent and likely reflects scarring. 2. There is no evidence of CHF nor pneumonia. 3. A small amount of pleural fluid at the right lung base is suspected. This is not greatly changed than on previous studies. 4. Chest CT scanning is recommended if the patient's symptoms warrant further evaluation.  SIGNATURE  Electronically Signed   By: David  Martinique   On: 03/03/2014 17:25   Ct Head Wo Contrast  03/12/2014   CLINICAL DATA:  Altered mental status  EXAM: CT HEAD WITHOUT CONTRAST  CT CERVICAL SPINE WITHOUT CONTRAST  TECHNIQUE: Multidetector CT imaging of the head and cervical spine was performed following the standard protocol without intravenous contrast. Multiplanar CT image reconstructions of the cervical spine were also generated.  COMPARISON:  01/15/2014 and cervical spine 12/24/2012  FINDINGS: CT HEAD FINDINGS  No skull fracture is noted. Atherosclerotic calcifications of carotid siphon. The paranasal sign scattered there is mucosal thickening bilateral maxillary sinus. Mucosal thickening bilateral ethmoid air cells. The mastoid air cells are unremarkable.  Moderate cerebral atrophy again noted. Again noted periventricular and subcortical white matter decreased attenuation consistent with chronic small vessel  ischemic changes. No acute cortical infarction. No mass lesion is noted on this unenhanced scan. Stable small lacunar infarct in right basal ganglia.  CT CERVICAL SPINE FINDINGS  Axial images of the cervical spine shows no acute fracture.  Computer processed images shows no acute fracture. Again noted 3.5 mm anterolisthesis C4 on C5 vertebral body. Again noted disc space flattening with mild anterior spurring at at C5-C6 level. Significant disc space flattening with endplate sclerotic changes anterior and posterior spurring at C6-C7 level. Mild disc space flattening at C7-T1 level. No prevertebral soft tissue swelling. Stable degenerative changes C1-C2 articulation. Cervical airway is patent.  IMPRESSION: 1. No acute intracranial abnormality. Stable atrophy and chronic white matter disease. 2. No cervical spine acute fracture. Stable 3.5 mm anterolisthesis C4 on C5 vertebral body. Multilevel degenerative changes as described above.   Electronically Signed   By: Lahoma Crocker M.D.   On: 03/12/2014 12:38   Ct Chest Wo Contrast  03/04/2014   CLINICAL DATA Right pleural effusion on chest radiograph. COPD, fibrosis. Question pneumonia.  EXAM CT CHEST WITHOUT CONTRAST  TECHNIQUE Multidetector CT imaging of the chest was performed following the standard protocol without IV contrast.  COMPARISON DG CHEST 2 VIEW dated 03/03/2014; CT CHEST W/O CM dated 11/18/2012  FINDINGS No pathologically enlarged mediastinal or axillary lymph nodes. Hilar regions are difficult to definitively evaluate without IV contrast. Atherosclerotic calcification of the arterial vasculature, including extensive involvement of the coronary arteries. Heart is enlarged. No pericardial effusion.  A thin rind of pleural fluid is seen in the lower right hemi thorax with associated pleural thickening. Scattered scarring, volume loss and probable rounded atelectasis at the base of  the right hemi thorax. Mild volume loss in the left lower lobe, increased from  11/18/2012, adjacent to an elevated left hemidiaphragm. Airway is unremarkable.  Incidental imaging of the upper abdomen shows the visualized portion of the liver to be grossly unremarkable. Cholecystectomy. Slight thickening of the right adrenal gland, as before. Visualized portion of the left adrenal gland, spleen, pancreas, stomach and bowel are otherwise grossly unremarkable. No upper abdominal adenopathy. No worrisome lytic or sclerotic lesions. Degenerative changes are seen in the spine.  IMPRESSION 1. Small right fibrothorax with adjacent scarring and probable rounded atelectasis at the base of the right hemi thorax. 2. Coronary artery calcification. 3. Slightly increased volume loss in the left lower lobe, adjacent to an elevated left hemidiaphragm.  SIGNATURE  Electronically Signed   By: Lorin Picket M.D.   On: 03/04/2014 17:13   Ct Cervical Spine Wo Contrast  03/12/2014   CLINICAL DATA:  Altered mental status  EXAM: CT HEAD WITHOUT CONTRAST  CT CERVICAL SPINE WITHOUT CONTRAST  TECHNIQUE: Multidetector CT imaging of the head and cervical spine was performed following the standard protocol without intravenous contrast. Multiplanar CT image reconstructions of the cervical spine were also generated.  COMPARISON:  01/15/2014 and cervical spine 12/24/2012  FINDINGS: CT HEAD FINDINGS  No skull fracture is noted. Atherosclerotic calcifications of carotid siphon. The paranasal sign scattered there is mucosal thickening bilateral maxillary sinus. Mucosal thickening bilateral ethmoid air cells. The mastoid air cells are unremarkable.  Moderate cerebral atrophy again noted. Again noted periventricular and subcortical white matter decreased attenuation consistent with chronic small vessel ischemic changes. No acute cortical infarction. No mass lesion is noted on this unenhanced scan. Stable small lacunar infarct in right basal ganglia.  CT CERVICAL SPINE FINDINGS  Axial images of the cervical spine shows no acute  fracture.  Computer processed images shows no acute fracture. Again noted 3.5 mm anterolisthesis C4 on C5 vertebral body. Again noted disc space flattening with mild anterior spurring at at C5-C6 level. Significant disc space flattening with endplate sclerotic changes anterior and posterior spurring at C6-C7 level. Mild disc space flattening at C7-T1 level. No prevertebral soft tissue swelling. Stable degenerative changes C1-C2 articulation. Cervical airway is patent.  IMPRESSION: 1. No acute intracranial abnormality. Stable atrophy and chronic white matter disease. 2. No cervical spine acute fracture. Stable 3.5 mm anterolisthesis C4 on C5 vertebral body. Multilevel degenerative changes as described above.   Electronically Signed   By: Lahoma Crocker M.D.   On: 03/12/2014 12:38    Microbiology: Recent Results (from the past 240 hour(s))  URINE CULTURE     Status: None   Collection Time    03/12/14  6:10 PM      Result Value Ref Range Status   Specimen Description URINE, CLEAN CATCH   Final   Special Requests NONE   Final   Culture  Setup Time     Final   Value: 03/13/2014 00:35     Performed at [facility]     Final   Value: NO GROWTH     Performed at [facility]   Culture     Final   Value: NO GROWTH     Performed at [facility]   Report Status 03/13/2014 FINAL   Final  CULTURE, BLOOD (ROUTINE X 2)     Status: None   Collection Time    03/13/14  9:20 AM      Result Value Ref Range Status  Specimen Description BLOOD RIGHT ANTECUBITAL   Final   Special Requests BOTTLES DRAWN AEROBIC AND ANAEROBIC 5CC   Final   Culture  Setup Time     Final   Value: 03/13/2014 13:17     Performed at [facility]   Culture     Final   Value:        BLOOD CULTURE RECEIVED NO GROWTH TO DATE CULTURE WILL BE HELD FOR 5 DAYS BEFORE ISSUING A FINAL NEGATIVE REPORT     Performed at [facility]   Report Status PENDING   Incomplete  CULTURE, BLOOD  (ROUTINE X 2)     Status: None   Collection Time    03/13/14  9:30 AM      Result Value Ref Range Status   Specimen Description BLOOD RIGHT ARM   Final   Special Requests BOTTLES DRAWN AEROBIC AND ANAEROBIC 2CC   Final   Culture  Setup Time     Final   Value: 03/13/2014 13:17     Performed at [facility]   Culture     Final   Value:        BLOOD CULTURE RECEIVED NO GROWTH TO DATE CULTURE WILL BE HELD FOR 5 DAYS BEFORE ISSUING A FINAL NEGATIVE REPORT     Performed at [facility]   Report Status PENDING   Incomplete     Labs: Basic Metabolic Panel:  Recent Labs Lab 03/12/14 1115 03/12/14 1511 03/13/14 0448 03/15/14 0530 03/17/14 0443  NA 141 142 142 138 139  K 4.0 4.3 3.6* 4.7 3.9  CL 100 102 103 102 102  CO2 26 28 28 24 26   GLUCOSE 127* 136* 119* 124* 107*  BUN 28* 26* 20 25* 32*  CREATININE 1.62* 1.50* 1.24 1.60* 1.44*  CALCIUM 9.2 8.5 8.5 8.9 9.0  MG  --  2.2  --   --   --   PHOS  --  3.6  --   --   --    Liver Function Tests:  Recent Labs Lab 03/12/14 1115 03/12/14 1511 03/13/14 0448  AST 30 21 23   ALT 16 13 14   ALKPHOS 60 55 55  BILITOT 0.4 0.4 0.3  PROT 6.8 5.9* 5.7*  ALBUMIN 3.5 3.1* 3.1*   No results found for this basename: LIPASE, AMYLASE,  in the last 168 hours No results found for this basename: AMMONIA,  in the last 168 hours CBC:  Recent Labs Lab 03/12/14 1115 03/12/14 1511 03/13/14 0448 03/17/14 0443  WBC 11.2* 8.2 9.2 15.6*  NEUTROABS 8.8* 6.7  --  12.1*  HGB 12.7* 11.6* 12.1* 12.0*  HCT 38.9* 34.9* 36.4* 35.6*  MCV 105.1* 104.5* 103.7* 102.6*  PLT 336 312 314 325   Cardiac Enzymes: No results found for this basename: CKTOTAL, CKMB, CKMBINDEX, TROPONINI,  in the last 168 hours BNP: BNP (last 3 results)  Recent Labs  04/16/13 2327 03/03/14 1647  PROBNP 1599.0* 1635.0*   CBG:  Recent Labs Lab 03/14/14 1059 03/15/14 0755 03/15/14 2222 03/16/14 0734 03/17/14 0750  GLUCAP 101* 99 136* 108* 101*        Signed:  Malone Vanblarcom  [facility] 03/17/2014, 10:45 AM

## 2014-03-17 NOTE — Progress Notes (Signed)
Clinical Social Work  CSW faxed DC summary to [facility] who is agreeable to accept today. Patient, son, and RN all aware of DC plans. Patient reports he is happy to return to a facility that he has been to in the past and feels he will regain his strength. CSW prepared DC packet with FL2 and hard scripts. CSW coordinated transportation via [facility] per patient and family request. RN to call report to SNF. [facility] Request #: [id number].  CSW is signing off but available if needed.  [place],  [phone]

## 2014-03-17 NOTE — Progress Notes (Signed)
Clinical Social Work  CSW spoke with son who confirmed that they prefer placement at [facility]. [facility] is agreeable to accept today or whenever medically stable. CSW informed MD of DC plans and will continue to follow.  [place], [place] [phone]

## 2014-03-18 ENCOUNTER — Telehealth: Payer: Self-pay | Admitting: Internal Medicine

## 2014-03-18 ENCOUNTER — Ambulatory Visit: Payer: Medicare Other | Admitting: Internal Medicine

## 2014-03-18 NOTE — Telephone Encounter (Signed)
Pt is requesting to switch from Dr. Jenny Reichmann to Dr. Alain Marion.  Will this be ok?

## 2014-03-18 NOTE — Telephone Encounter (Signed)
OK w/me Thx 

## 2014-03-19 ENCOUNTER — Non-Acute Institutional Stay (SKILLED_NURSING_FACILITY): Payer: Medicare Other | Admitting: Internal Medicine

## 2014-03-19 ENCOUNTER — Encounter: Payer: Self-pay | Admitting: Internal Medicine

## 2014-03-19 DIAGNOSIS — F3289 Other specified depressive episodes: Secondary | ICD-10-CM

## 2014-03-19 DIAGNOSIS — E039 Hypothyroidism, unspecified: Secondary | ICD-10-CM

## 2014-03-19 DIAGNOSIS — J189 Pneumonia, unspecified organism: Secondary | ICD-10-CM

## 2014-03-19 DIAGNOSIS — F32A Depression, unspecified: Secondary | ICD-10-CM

## 2014-03-19 DIAGNOSIS — R32 Unspecified urinary incontinence: Secondary | ICD-10-CM

## 2014-03-19 DIAGNOSIS — R5383 Other fatigue: Secondary | ICD-10-CM

## 2014-03-19 DIAGNOSIS — R5381 Other malaise: Secondary | ICD-10-CM

## 2014-03-19 DIAGNOSIS — D72829 Elevated white blood cell count, unspecified: Secondary | ICD-10-CM

## 2014-03-19 DIAGNOSIS — R42 Dizziness and giddiness: Secondary | ICD-10-CM

## 2014-03-19 DIAGNOSIS — I10 Essential (primary) hypertension: Secondary | ICD-10-CM

## 2014-03-19 DIAGNOSIS — M353 Polymyalgia rheumatica: Secondary | ICD-10-CM

## 2014-03-19 DIAGNOSIS — F329 Major depressive disorder, single episode, unspecified: Secondary | ICD-10-CM

## 2014-03-19 DIAGNOSIS — R531 Weakness: Secondary | ICD-10-CM

## 2014-03-19 LAB — CULTURE, BLOOD (ROUTINE X 2)
CULTURE: NO GROWTH
Culture: NO GROWTH

## 2014-03-19 NOTE — Telephone Encounter (Signed)
Ok with me 

## 2014-03-19 NOTE — Progress Notes (Signed)
Patient ID: [name], male   DOB: June 21, 1923, 80 y.o.   MRN: [id number]       PCP: Cathlean Cower, MD   Allergies  Allergen Reactions  . Bactrim [Sulfamethoxazole-Trimethoprim] Other (See Comments)    hallucinations  . Horse-Derived Products Hives    Chief Complaint: new admission  HPI:  80 y/o male patient is here for STR after hospital admission from 03/12/14- 03/17/14 with acute respiratory failure in setting of HCAP. He has history of COPD, PMR, afib, CKD. He was on antibiotics and has been discharged on levaquin. Given his weakness, he was sent to SNF for rehabilitation. He was seen in his room today. He feels fine today. He does have some shortness of breath with exertion. He also complaints of dizziness with change of position.Denies any pain today No other concern from staff. He has been working with therapy. No falls reported  Review of Systems:  Constitutional: Negative for fever, chills, weight loss, diaphoresis.  HENT: Negative for congestion, hearing loss and sore throat.   Eyes: Negative for eye pain, blurred vision, double vision and discharge.  Respiratory: has some cough with sputum production, no dyspnea at rest   Cardiovascular: Negative for chest pain, palpitations, orthopnea and leg swelling.  Gastrointestinal: Negative for heartburn, nausea, vomiting, abdominal pain, diarrhea and constipation.  Genitourinary: Negative for dysuria, urgency, frequency, hematuria and flank pain.  Musculoskeletal: Negative for back pain, falls, joint pain and myalgias.  Skin: Negative for itching and rash.  Neurological: Negative for tingling, focal weakness and headaches. has weakness Psychiatric/Behavioral: Negative for depression and memory loss. The patient is not nervous/anxious.     Past Medical History  Diagnosis Date  . CAD (coronary artery disease)     s/p NSTEMI and BMS stent diagonal07/09;  Lexiscan Myoview 9/13:  EF 62%, no ischemia  . AS (aortic stenosis)     a.   Echo 2009 mean gradient 60mm HG. AVA 2.18;  b.  Echo 4/11 mild AS mean gradient 64mm HG;  c.  Echo 04/2011: Mild LVH, EF 0000000, grade 1 diastolic dysfunction, mild aortic stenosis, mean gradient 14, mild LAE;  d. Echo 9/13: mod LVH, EF 50-55%, mild to mod AS, mean gradient 17 mmHg  . AF (paroxysmal atrial fibrillation)     Holter monitor 2.5 sec pauses  . Fatigue     CPX 11/09 VO2  14.4 (75% predicte)d, slope 35.  O2 pulse normal.  VO2 corrected for body weight 17.6.  Marland Kitchen Hypothyroidism   . Hyperlipidemia   . History of prostate cancer   . Allergic rhinitis   . DM (diabetes mellitus)     type II diet controlled  . Shingles   . Left carotid stenosis   . Vitamin B 12 deficiency   . Anemia   . Osteoarthritis   . Nephrolithiasis   . Obesity   . OSA (obstructive sleep apnea)     AHI 15 PSG 09/06/08  . Hx of colonoscopy   . Stroke   . Anxiety and depression   . PMR (polymyalgia rheumatica) 05/29/2012  . Hypogonadism male 05/29/2012  . Hyperparathyroidism 05/29/2012  . TIA (transient ischemic attack) 05/29/2012  . CKD (chronic kidney disease) stage 4, GFR 15-29 ml/min 05/29/2012  . COPD (chronic obstructive pulmonary disease) 05/29/2012  . Myocardial infarction   . Cancer     prostate ca history   Past Surgical History  Procedure Laterality Date  . Appendectomy    . Hernia repair    . Right carpal  tunnel release    . Lumbar spine surgery    . Cystectomy      neck  . Prostatectomy    . Vasectomy    . Total knee arthroplasty      right  . Cholecystectomy     Social History:   reports that he quit smoking about 55 years ago. His smoking use included Cigarettes. He has a 12.5 pack-year smoking history. He quit smokeless tobacco use about 43 years ago. His smokeless tobacco use included Snuff and Chew. He reports that he does not drink alcohol or use illicit drugs.  Family History  Problem Relation Age of Onset  . Stroke Mother 62  . Coronary artery disease      father, brother, sister    . Alcohol abuse Father 66    Medications: Patient's Medications  New Prescriptions   No medications on file  Previous Medications   ALBUTEROL (PROVENTIL HFA;VENTOLIN HFA) 108 (90 BASE) MCG/ACT INHALER    Inhale 2 puffs into the lungs every 6 (six) hours as needed for wheezing or shortness of breath.   AMLODIPINE (NORVASC) 5 MG TABLET    Take 1 tablet (5 mg total) by mouth daily.   ASCORBIC ACID (VITAMIN C) 500 MG TABLET    Take 500 mg by mouth at bedtime.    ATORVASTATIN (LIPITOR) 10 MG TABLET    Take 10 mg by mouth every morning.   BISACODYL (DULCOLAX) 5 MG EC TABLET    Take 1 tablet (5 mg total) by mouth daily as needed for moderate constipation.   CHOLECALCIFEROL (VITAMIN D) 1000 UNITS TABLET    Take 1,000 Units by mouth at bedtime.    CLOPIDOGREL (PLAVIX) 75 MG TABLET    Take 75 mg by mouth every evening.    CYANOCOBALAMIN 500 MCG TABLET    Take 500 mcg by mouth every morning.   DESMOPRESSIN (DDAVP) 0.1 MG TABLET    Take 0.1 mg by mouth every evening.   FOLIC ACID (FOLVITE) 1 MG TABLET    Take 1 mg by mouth every morning.    FUROSEMIDE (LASIX) 20 MG TABLET    Take 20 mg by mouth 2 (two) times daily.   HYOSCYAMINE (LEVSIN, ANASPAZ) 0.125 MG TABLET    Take 0.125 mg by mouth 4 (four) times daily as needed for cramping.    IRON-VITAMINS (GERITOL COMPLETE PO)    Take 1 tablet by mouth every morning.    LEVOFLOXACIN (LEVAQUIN) 500 MG TABLET    Take 1 tablet (500 mg total) by mouth daily.   LEVOTHYROXINE (SYNTHROID, LEVOTHROID) 137 MCG TABLET    Take 137 mcg by mouth daily before breakfast.    MALTODEXTRIN-XANTHAN GUM (RESOURCE THICKENUP CLEAR) POWD    As recommended   MECLIZINE (ANTIVERT) 25 MG TABLET    Take 1 tablet (25 mg total) by mouth 3 (three) times daily as needed for dizziness.   METHOTREXATE (RHEUMATREX) 2.5 MG TABLET    Take 10 mg by mouth once a week. Takes 4 tablets weekly on Wednesday.   Caution:Chemotherapy. Protect from light.   METHYLPREDNISOLONE (MEDROL) 4 MG TABLET     Take 6 mg by mouth every morning.    MIRABEGRON ER (MYRBETRIQ) 25 MG TB24 TABLET    Take 25 mg by mouth at bedtime.   POLYETHYLENE GLYCOL (MIRALAX / GLYCOLAX) PACKET    Take 17 g by mouth daily as needed for moderate constipation.   RISPERIDONE (RISPERDAL) 0.5 MG TABLET    Take 1 tablet (0.5  mg total) by mouth at bedtime.   SERTRALINE (ZOLOFT) 100 MG TABLET    Take 100 mg by mouth at bedtime.   SOLIFENACIN (VESICARE) 5 MG TABLET    Take 5 mg by mouth every morning.   TRAMADOL (ULTRAM) 50 MG TABLET    Take one tablet by mouth twice daily for pain  Modified Medications   No medications on file  Discontinued Medications   No medications on file     Physical Exam: Filed Vitals:   03/19/14 1048  BP: 110/80  Pulse: 80  Temp: 97.7 F (36.5 C)  Resp: 18  Height: 5\' 8"  (1.727 m)  Weight: 208 lb (94.348 kg)  SpO2: 96%    General- elderly male in no acute distress Head- atraumatic, normocephalic Eyes- PERRLA, EOMI, no pallor, no icterus, no discharge Neck- no lymphadenopathy, no thyromegaly, no jugular vein distension Nose- normal nasal mucosa, no maxillary or frontal sinus tenderness Cardiovascular- normal s1,s2, no murmurs/ rubs/ gallops Respiratory- bilateral clear to auscultation, scattered rhonchi, no wheeze or crackles, no use of accessory muscles Abdomen- bowel sounds present, soft, non tender Musculoskeletal- able to move all 4 extremities, no spinal and paraspinal tenderness, needs assistance with standing up, moving around with walker for short distance and otherwise on wheelchair, trace edema Neurological- no focal deficit Skin- warm and dry, bruises in right arm and right elbow skin tear Psychiatry- alert and oriented to person, place and time, normal mood and affect  Labs reviewed: Basic Metabolic Panel:  Recent Labs  03/12/14 1511 03/13/14 0448 03/15/14 0530 03/17/14 0443  NA 142 142 138 139  K 4.3 3.6* 4.7 3.9  CL 102 103 102 102  CO2 28 28 24 26   GLUCOSE 136*  119* 124* 107*  BUN 26* 20 25* 32*  CREATININE 1.50* 1.24 1.60* 1.44*  CALCIUM 8.5 8.5 8.9 9.0  MG 2.2  --   --   --   PHOS 3.6  --   --   --    Liver Function Tests:  Recent Labs  03/12/14 1115 03/12/14 1511 03/13/14 0448  AST 30 21 23   ALT 16 13 14   ALKPHOS 60 55 55  BILITOT 0.4 0.4 0.3  PROT 6.8 5.9* 5.7*  ALBUMIN 3.5 3.1* 3.1*   No results found for this basename: LIPASE, AMYLASE,  in the last 8760 hours No results found for this basename: AMMONIA,  in the last 8760 hours CBC:  Recent Labs  03/12/14 1115 03/12/14 1511 03/13/14 0448 03/17/14 0443  WBC 11.2* 8.2 9.2 15.6*  NEUTROABS 8.8* 6.7  --  12.1*  HGB 12.7* 11.6* 12.1* 12.0*  HCT 38.9* 34.9* 36.4* 35.6*  MCV 105.1* 104.5* 103.7* 102.6*  PLT 336 312 314 325   Cardiac Enzymes:  Recent Labs  03/03/14 2250 03/04/14 0520 03/04/14 1112  TROPONINI <0.30 <0.30 <0.30   BNP: No components found with this basename: POCBNP,  CBG:  Recent Labs  03/15/14 2222 03/16/14 0734 03/17/14 0750  GLUCAP 136* 108* 101*    Radiological Exams:  Dg Chest 2 View  03/16/2014   CLINICAL DATA:  Pneumonia.  EXAM: CHEST  2 VIEW  COMPARISON:  03/12/2014.  FINDINGS: The heart is enlarged but stable. Moderate tortuosity and ectasia of the thoracic aorta is stable. Very low lung volumes with vascular crowding and bibasilar atelectasis. No obvious infiltrates or edema. Small effusions are suspected.  IMPRESSION: Low lung volumes with vascular crowding and bibasilar atelectasis.  Probable small bilateral pleural effusions.   Electronically Signed  By: Kalman Jewels M.D.   On: 03/16/2014 11:00   Dg Chest 2 View  03/12/2014   CLINICAL DATA:  Shortness of breath, cough  EXAM: CHEST  2 VIEW  COMPARISON:  March 03, 2014  FINDINGS: The heart size and mediastinal contours are stable. The heart size is enlarged. The aorta is tortuous. There patchy consolidation of both lung bases increased compared to prior exam. There is no pulmonary  edema or pleural effusion. The visualized skeletal structures are stable.  IMPRESSION: Increased patchy consolidation of both lung bases, developing pneumonia not excluded.   Electronically Signed   By: Abelardo Diesel M.D.   On: 03/12/2014 12:00   Dg Chest 2 View  03/03/2014   CLINICAL DATA Dyspnea and cough with history of coronary artery disease COPD and diabetes  EXAM CHEST  2 VIEW  COMPARISON DG CHEST 1V PORT dated 01/15/2014; DG CHEST 2 VIEW dated 01/09/2014  FINDINGS The lungs are mildly hyperinflated. There is no focal infiltrate. There is stable mild shift of the mediastinum towards the right. There is minimal stable density along the lateral aspect of the left hemithorax consistent with fibrosis. Density in the right lateral costophrenic angle is slightly more conspicuous today. There is stable linear density projecting over the lower thoracic spine consistent with fibrosis. The cardiac silhouette is top-normal in size but stable. The pulmonary vascularity is not engorged. The mediastinum is normal in width. The observed portions of the bony thorax appear normal.  IMPRESSION 1. There are extensive chronic changes consistent with COPD and pulmonary fibrosis. Density at the left lung base is persistent and likely reflects scarring. 2. There is no evidence of CHF nor pneumonia. 3. A small amount of pleural fluid at the right lung base is suspected. This is not greatly changed than on previous studies. 4. Chest CT scanning is recommended if the patient's symptoms warrant further evaluation.  SIGNATURE  Electronically Signed   By: David  Martinique   On: 03/03/2014 17:25   Ct Head Wo Contrast  03/12/2014   CLINICAL DATA:  Altered mental status  EXAM: CT HEAD WITHOUT CONTRAST  CT CERVICAL SPINE WITHOUT CONTRAST  TECHNIQUE: Multidetector CT imaging of the head and cervical spine was performed following the standard protocol without intravenous contrast. Multiplanar CT image reconstructions of the cervical spine  were also generated.  COMPARISON:  01/15/2014 and cervical spine 12/24/2012  FINDINGS: CT HEAD FINDINGS  No skull fracture is noted. Atherosclerotic calcifications of carotid siphon. The paranasal sign scattered there is mucosal thickening bilateral maxillary sinus. Mucosal thickening bilateral ethmoid air cells. The mastoid air cells are unremarkable.  Moderate cerebral atrophy again noted. Again noted periventricular and subcortical white matter decreased attenuation consistent with chronic small vessel ischemic changes. No acute cortical infarction. No mass lesion is noted on this unenhanced scan. Stable small lacunar infarct in right basal ganglia.  CT CERVICAL SPINE FINDINGS  Axial images of the cervical spine shows no acute fracture.  Computer processed images shows no acute fracture. Again noted 3.5 mm anterolisthesis C4 on C5 vertebral body. Again noted disc space flattening with mild anterior spurring at at C5-C6 level. Significant disc space flattening with endplate sclerotic changes anterior and posterior spurring at C6-C7 level. Mild disc space flattening at C7-T1 level. No prevertebral soft tissue swelling. Stable degenerative changes C1-C2 articulation. Cervical airway is patent.  IMPRESSION: 1. No acute intracranial abnormality. Stable atrophy and chronic white matter disease. 2. No cervical spine acute fracture. Stable 3.5 mm anterolisthesis C4 on C5  vertebral body. Multilevel degenerative changes as described above.   Electronically Signed   By: Lahoma Crocker M.D.   On: 03/12/2014 12:38   Ct Chest Wo Contrast  03/04/2014   CLINICAL DATA Right pleural effusion on chest radiograph. COPD, fibrosis. Question pneumonia.  EXAM CT CHEST WITHOUT CONTRAST  TECHNIQUE Multidetector CT imaging of the chest was performed following the standard protocol without IV contrast.  COMPARISON DG CHEST 2 VIEW dated 03/03/2014; CT CHEST W/O CM dated 11/18/2012  FINDINGS No pathologically enlarged mediastinal or axillary  lymph nodes. Hilar regions are difficult to definitively evaluate without IV contrast. Atherosclerotic calcification of the arterial vasculature, including extensive involvement of the coronary arteries. Heart is enlarged. No pericardial effusion.  A thin rind of pleural fluid is seen in the lower right hemi thorax with associated pleural thickening. Scattered scarring, volume loss and probable rounded atelectasis at the base of the right hemi thorax. Mild volume loss in the left lower lobe, increased from 11/18/2012, adjacent to an elevated left hemidiaphragm. Airway is unremarkable.  Incidental imaging of the upper abdomen shows the visualized portion of the liver to be grossly unremarkable. Cholecystectomy. Slight thickening of the right adrenal gland, as before. Visualized portion of the left adrenal gland, spleen, pancreas, stomach and bowel are otherwise grossly unremarkable. No upper abdominal adenopathy. No worrisome lytic or sclerotic lesions. Degenerative changes are seen in the spine.  IMPRESSION 1. Small right fibrothorax with adjacent scarring and probable rounded atelectasis at the base of the right hemi thorax. 2. Coronary artery calcification. 3. Slightly increased volume loss in the left lower lobe, adjacent to an elevated left hemidiaphragm.  SIGNATURE  Electronically Signed   By: Lorin Picket M.D.   On: 03/04/2014 17:13   Ct Cervical Spine Wo Contrast  03/12/2014   CLINICAL DATA:  Altered mental status  EXAM: CT HEAD WITHOUT CONTRAST  CT CERVICAL SPINE WITHOUT CONTRAST  TECHNIQUE: Multidetector CT imaging of the head and cervical spine was performed following the standard protocol without intravenous contrast. Multiplanar CT image reconstructions of the cervical spine were also generated.  COMPARISON:  01/15/2014 and cervical spine 12/24/2012  FINDINGS: CT HEAD FINDINGS  No skull fracture is noted. Atherosclerotic calcifications of carotid siphon. The paranasal sign scattered there is  mucosal thickening bilateral maxillary sinus. Mucosal thickening bilateral ethmoid air cells. The mastoid air cells are unremarkable.  Moderate cerebral atrophy again noted. Again noted periventricular and subcortical white matter decreased attenuation consistent with chronic small vessel ischemic changes. No acute cortical infarction. No mass lesion is noted on this unenhanced scan. Stable small lacunar infarct in right basal ganglia.  CT CERVICAL SPINE FINDINGS  Axial images of the cervical spine shows no acute fracture.  Computer processed images shows no acute fracture. Again noted 3.5 mm anterolisthesis C4 on C5 vertebral body. Again noted disc space flattening with mild anterior spurring at at C5-C6 level. Significant disc space flattening with endplate sclerotic changes anterior and posterior spurring at C6-C7 level. Mild disc space flattening at C7-T1 level. No prevertebral soft tissue swelling. Stable degenerative changes C1-C2 articulation. Cervical airway is patent.  IMPRESSION: 1. No acute intracranial abnormality. Stable atrophy and chronic white matter disease. 2. No cervical spine acute fracture. Stable 3.5 mm anterolisthesis C4 on C5 vertebral body. Multilevel degenerative changes as described above.   Electronically Signed   By: Lahoma Crocker M.D.   On: 03/12/2014 12:38    Assessment/Plan  Generalized weakness- in setting of recent infection and respiratory failure. Here for rehabilitation.  Will have him work with PT and OT for gait training and strengthening exercises. Fall precautions. Has easy bruising, skin tears. Will need skin care.  HCAP- breathing has improved but still has dyspnea on exertion. Will order incentive spirometer to help prevent atelectasis. Continue albuterol inhaler prn, completes levaquin on 03/22/14. Aspiration precautions. Will have him on mucinex prn for cough  Dizziness- will change his prn meclizine to 12.5 mg bid for now and reassess. Encouraged slow change of  position and fall precautions to be taken  Hypothyroidism Continue levothyroxine for now  HTN Stable. Continue amlodipine with holding parameters  PMR Stable. Continue medrol with tramadol for pain. Also continue methotrexate with folic acid  Depression Continue zoloft and risperidone  Urinary continence Continue myrbetriq  Leukocytosis On chronic steroid use. Monitor wbc periodically  Family/ staff Communication: reviewed care plan with patient and nursing supervisor  Goals of care: short term rehabilitation   Labs/tests ordered- cbc with diff, cmp    Blanchie Serve, MD  [facility] [phone] (Monday-Friday 8 am - 5 pm) [phone] (afterhours)

## 2014-03-20 NOTE — Telephone Encounter (Signed)
LMOM with wife.

## 2014-03-23 ENCOUNTER — Ambulatory Visit: Payer: Medicare Other | Admitting: Internal Medicine

## 2014-03-23 NOTE — Telephone Encounter (Signed)
Pt's son is aware and set up an appt with Dr. Alain Marion on April 10.

## 2014-04-03 ENCOUNTER — Other Ambulatory Visit (INDEPENDENT_AMBULATORY_CARE_PROVIDER_SITE_OTHER): Payer: Medicare Other

## 2014-04-03 ENCOUNTER — Encounter: Payer: Self-pay | Admitting: Internal Medicine

## 2014-04-03 ENCOUNTER — Other Ambulatory Visit: Payer: Self-pay | Admitting: *Deleted

## 2014-04-03 ENCOUNTER — Ambulatory Visit (INDEPENDENT_AMBULATORY_CARE_PROVIDER_SITE_OTHER): Payer: Medicare Other | Admitting: Internal Medicine

## 2014-04-03 VITALS — BP 110/70 | Temp 97.5°F | Ht 68.0 in | Wt 209.0 lb

## 2014-04-03 DIAGNOSIS — F0390 Unspecified dementia without behavioral disturbance: Secondary | ICD-10-CM

## 2014-04-03 DIAGNOSIS — I129 Hypertensive chronic kidney disease with stage 1 through stage 4 chronic kidney disease, or unspecified chronic kidney disease: Secondary | ICD-10-CM

## 2014-04-03 DIAGNOSIS — M353 Polymyalgia rheumatica: Secondary | ICD-10-CM

## 2014-04-03 DIAGNOSIS — E538 Deficiency of other specified B group vitamins: Secondary | ICD-10-CM

## 2014-04-03 DIAGNOSIS — I10 Essential (primary) hypertension: Secondary | ICD-10-CM

## 2014-04-03 DIAGNOSIS — I959 Hypotension, unspecified: Secondary | ICD-10-CM

## 2014-04-03 DIAGNOSIS — E039 Hypothyroidism, unspecified: Secondary | ICD-10-CM

## 2014-04-03 DIAGNOSIS — J449 Chronic obstructive pulmonary disease, unspecified: Secondary | ICD-10-CM

## 2014-04-03 DIAGNOSIS — E119 Type 2 diabetes mellitus without complications: Secondary | ICD-10-CM

## 2014-04-03 DIAGNOSIS — I2510 Atherosclerotic heart disease of native coronary artery without angina pectoris: Secondary | ICD-10-CM

## 2014-04-03 LAB — BASIC METABOLIC PANEL
BUN: 29 mg/dL — ABNORMAL HIGH (ref 6–23)
CALCIUM: 8.8 mg/dL (ref 8.4–10.5)
CO2: 28 meq/L (ref 19–32)
Chloride: 93 mEq/L — ABNORMAL LOW (ref 96–112)
Creatinine, Ser: 1.6 mg/dL — ABNORMAL HIGH (ref 0.4–1.5)
GFR: 45.08 mL/min — ABNORMAL LOW (ref >60.00)
GLUCOSE: 120 mg/dL — AB (ref 70–99)
Potassium: 4 mEq/L (ref 3.5–5.1)
Sodium: 132 mEq/L — ABNORMAL LOW (ref 135–145)

## 2014-04-03 LAB — HEPATIC FUNCTION PANEL
ALBUMIN: 3.7 g/dL (ref 3.5–5.2)
ALT: 25 U/L (ref 0–53)
AST: 26 U/L (ref 0–37)
Alkaline Phosphatase: 69 U/L (ref 39–117)
BILIRUBIN TOTAL: 0.6 mg/dL (ref 0.3–1.2)
Bilirubin, Direct: 0 mg/dL (ref 0.0–0.3)
Total Protein: 6.4 g/dL (ref 6.0–8.3)

## 2014-04-03 LAB — SEDIMENTATION RATE: Sed Rate: 14 mm/hr (ref 0–22)

## 2014-04-03 LAB — VITAMIN B12: Vitamin B-12: 501 pg/mL (ref 211–911)

## 2014-04-03 LAB — CK: Total CK: 39 U/L (ref 7–232)

## 2014-04-03 MED ORDER — VITAMIN B-12 1000 MCG SL SUBL: 1.0000 | SUBLINGUAL_TABLET | Freq: Every day | SUBLINGUAL | Status: DC

## 2014-04-03 MED ORDER — METHYLPREDNISOLONE 4 MG PO TABS: 4.0000 mg | ORAL_TABLET | Freq: Every day | ORAL | Status: DC

## 2014-04-03 MED ORDER — RISPERIDONE 0.5 MG PO TABS: 0.5000 mg | ORAL_TABLET | Freq: Every day | ORAL | Status: DC

## 2014-04-03 MED ORDER — TRAMADOL HCL 50 MG PO TABS: ORAL_TABLET | ORAL | Status: DC

## 2014-04-03 MED ORDER — SERTRALINE HCL 100 MG PO TABS: 50.0000 mg | ORAL_TABLET | Freq: Every day | ORAL | Status: DC

## 2014-04-03 MED ORDER — MIDODRINE HCL 2.5 MG PO TABS: 2.5000 mg | ORAL_TABLET | Freq: Three times a day (TID) | ORAL | Status: DC

## 2014-04-03 NOTE — Assessment & Plan Note (Signed)
Mild 2014 was hallucinating w/pneumonia

## 2014-04-03 NOTE — Assessment & Plan Note (Signed)
Continue with current prescription therapy as reflected on the Med list.  

## 2014-04-03 NOTE — Patient Instructions (Addendum)
Stop Lipitor Reduce Risperdal to 0.5 mg at night Reduce Medrol to 4 mg in am Stop Amlodipine if on it Reduce Zoloft to 50 mg at night

## 2014-04-03 NOTE — Telephone Encounter (Signed)
rx printed/faxed to [facility] @ [phone] 

## 2014-04-03 NOTE — Progress Notes (Signed)
Subjective:    Patient ID: [name], male    DOB: 1923/08/17, 80 y.o.   MRN: [id number]  HPI  Transfering PCP from Dr Jenny Reichmann.  C/o somnolence, fatigue  80 year old male with past medical history of COPD, PMR, paroxysmal atrial fibrillation (not on anticoagulation due to risk of falls), CKD, hypothyroidism, CAD, dyslipidemia, recent admission for COPD exacerbation, pneumonia and atrial fibrillation with RVR   Hospital Course:  Acute respiratory failure with hypoxia  - secondary to HCAP considering recent hospitalization  - he finished 5 days of vancomycin and cefepime. repeat CXR shows improvement of the pneumonia. He will continue levaquin for 3 more days to complete the course.  - weaned him off oxygen. PT eval recommended SNF.  - SWALLOW evaluation recommended regular diet with nectar thick liquid.  HCAP (healthcare-associated pneumonia)  - RECEIVED 5 DAYS OF vancomycin and cefepime and will be discharged on 3 more days of levaquin to complete the course.  - influenza negative, strepto and legionella antigen negative. UA is negative. Blood cultues negative so far. SPUTUM cultures could not be collected.  HYPOTHYROIDISM RESUME SYNTHROID.  HYPERLIPIDEMIA  - continue statin therapy  ANEMIA  - likely of chronic kidney disease  - hemoglobin 12.7 on admission  - no current indications for transfusion  HYPERTENSION, BENIGN  - start norvasc 5 mg daily for better BP control  CKD (chronic kidney disease) stage 4, GFR 15-29 ml/min  - creatinine of admission 1.62 Which has improved to 1.4. (on recent admission creatinine 1.47)  COPD (chronic obstructive pulmonary disease)  - COPD gold alert ordered  - nebulizer treatments scheduled and as needed, steroid low dose per home regimen, abx for HCAP  Chronic steroid use  - has history of hypogonadism and PMR  PMR (polymyalgia rheumatica)  - on chronic steroids  - continue methotrexate  Anxiety and depression  - continue zoloft    Urinary incontinence  - continue myrbetriq  Hypokalemia :  Replete as needed.  Leukocytosis: probably related to steroids.  - suspect he is non compliant to medications at home and we have started giving him methypred from admission. He is afebrile and his pneumonia has resolved.      Review of Systems  Constitutional: Positive for fatigue. Negative for appetite change and unexpected weight change.  HENT: Negative for congestion, nosebleeds, sneezing, sore throat and trouble swallowing.   Eyes: Negative for itching and visual disturbance.  Respiratory: Positive for shortness of breath. Negative for cough.   Cardiovascular: Negative for chest pain, palpitations and leg swelling.  Gastrointestinal: Positive for constipation. Negative for nausea, diarrhea, blood in stool and abdominal distention.  Genitourinary: Positive for frequency. Negative for dysuria, hematuria and decreased urine volume.  Musculoskeletal: Positive for arthralgias and myalgias. Negative for back pain, gait problem, joint swelling and neck pain.  Skin: Negative for rash.  Neurological: Negative for dizziness, tremors, syncope, speech difficulty, weakness and headaches.  Psychiatric/Behavioral: Positive for confusion and decreased concentration. Negative for suicidal ideas, sleep disturbance, self-injury, dysphoric mood and agitation. The patient is not nervous/anxious.        Objective:   Physical Exam  Constitutional: He is oriented to person, place, and time. He appears well-developed and well-nourished. No distress.  Obese   HENT:  Head: Normocephalic and atraumatic.  Right Ear: External ear normal.  Left Ear: External ear normal.  Nose: Nose normal.  Mouth/Throat: Oropharynx is clear and moist. No oropharyngeal exudate.  Eyes: Conjunctivae and EOM are normal. Pupils are equal,  round, and reactive to light. Right eye exhibits no discharge. Left eye exhibits no discharge. No scleral icterus.  Neck: Normal  range of motion. Neck supple. No JVD present. No tracheal deviation present. No thyromegaly present.  Cardiovascular: Regular rhythm and intact distal pulses.  Exam reveals no gallop and no friction rub.   Murmur heard. Pulmonary/Chest: Effort normal and breath sounds normal. No stridor. No respiratory distress. He has no wheezes. He has no rales. He exhibits no tenderness.  Abdominal: Soft. Bowel sounds are normal. He exhibits no distension and no mass. There is no tenderness. There is no rebound and no guarding.  Musculoskeletal: Normal range of motion. He exhibits edema (trace). He exhibits no tenderness.  Lymphadenopathy:    He has no cervical adenopathy.  Neurological: He is alert and oriented to person, place, and time. He has normal reflexes. No cranial nerve deficit. He exhibits normal muscle tone. Coordination abnormal.  Skin: Skin is warm and dry. No rash noted. He is not diaphoretic. No erythema. No pallor.  Psychiatric: He has a normal mood and affect. His behavior is normal. Judgment and thought content normal.  w/c Alert, somnolent, cooperative  Lab Results  Component Value Date   WBC 15.6* 03/17/2014   HGB 12.0* 03/17/2014   HCT 35.6* 03/17/2014   PLT 325 03/17/2014   GLUCOSE 120* 04/03/2014   CHOL 126 05/31/2012   TRIG 94.0 05/31/2012   HDL 50.60 05/31/2012   LDLDIRECT 143.3 04/16/2007   LDLCALC 57 05/31/2012   ALT 25 04/03/2014   AST 26 04/03/2014   NA 132* 04/03/2014   K 4.0 04/03/2014   CL 93* 04/03/2014   CREATININE 1.6* 04/03/2014   BUN 29* 04/03/2014   CO2 28 04/03/2014   TSH 1.068 03/12/2014   PSA 2.96 04/21/2008   INR 1.13 03/12/2014   HGBA1C 5.8* 03/04/2014         Assessment & Plan:

## 2014-04-03 NOTE — Assessment & Plan Note (Signed)
Reduce amlodipine to 2.5 mg a day

## 2014-04-03 NOTE — Assessment & Plan Note (Signed)
Chronic. Steroid intolerant - on MTX D/c lipitor

## 2014-04-03 NOTE — Progress Notes (Signed)
Pre visit review using our clinic review tool, if applicable. No additional management support is needed unless otherwise documented below in the visit note. 

## 2014-04-04 LAB — VITAMIN D 25 HYDROXY (VIT D DEFICIENCY, FRACTURES): Vit D, 25-Hydroxy: 53 ng/mL (ref 30–89)

## 2014-04-13 DIAGNOSIS — I959 Hypotension, unspecified: Secondary | ICD-10-CM

## 2014-04-13 DIAGNOSIS — J449 Chronic obstructive pulmonary disease, unspecified: Secondary | ICD-10-CM

## 2014-04-13 DIAGNOSIS — I129 Hypertensive chronic kidney disease with stage 1 through stage 4 chronic kidney disease, or unspecified chronic kidney disease: Secondary | ICD-10-CM

## 2014-04-13 DIAGNOSIS — E119 Type 2 diabetes mellitus without complications: Secondary | ICD-10-CM

## 2014-04-21 ENCOUNTER — Non-Acute Institutional Stay (SKILLED_NURSING_FACILITY): Payer: Medicare Other | Admitting: Internal Medicine

## 2014-04-21 DIAGNOSIS — N184 Chronic kidney disease, stage 4 (severe): Secondary | ICD-10-CM

## 2014-04-21 DIAGNOSIS — J449 Chronic obstructive pulmonary disease, unspecified: Secondary | ICD-10-CM

## 2014-04-21 DIAGNOSIS — R269 Unspecified abnormalities of gait and mobility: Secondary | ICD-10-CM

## 2014-04-23 NOTE — Progress Notes (Signed)
Patient ID: [name], male   DOB: 13-Jul-1923, 80 y.o.   MRN: [id number]                  PROGRESS NOTE  DATE:  04/21/2014    FACILITY: [facility]    LEVEL OF CARE:   SNF   Acute Visit/Discharge Visit     CHIEF COMPLAINT:  Discharge review.    HISTORY OF PRESENT ILLNESS:  This is a gentleman who came to [place] after a stay at [facility] from 03/12/2014 through 03/17/2014.  He was admitted with pneumonia and acute  respiratory failure.    He also has chronic renal failure, listed as stage IV, and COPD, on chronic steroids.  He also has polymyalgia rheumatica.    As mentioned, he was felt to have healthcare-acquired pneumonia, chronic renal failure with a creatinine between 1.47 and 1.62, COPD, and a history of anxiety and depression, on Zoloft.    SOCIAL HISTORY:  The patient is going to go home with his wife.   Apparently, they have some other caregiver with also support from a daughter and son.  He  apparently has a walker and a wheelchair at home, as he will need both of these.    REVIEW OF SYSTEMS:   CHEST/RESPIRATORY:  The patient is not complaining of shortness of breath.    CARDIAC:   No complaints of chest pain.   GI:  No nausea or vomiting.   PSYCHIATRIC:  He complains of feeling depressed.  I note that he has followed with Psychiatry here.    PHYSICAL EXAMINATION:   GENERAL APPEARANCE:  Pleasant, cooperative man in no distress.   CHEST/RESPIRATORY:  Clear air entry bilaterally.  There is no wheezing.  His air entry is actually fairly good.   CARDIOVASCULAR:  CARDIAC:   Heart sounds are normal.  No signs of congestive heart failure.   EDEMA/VARICOSITIES:  Extremities:  No edema.   NEUROLOGICAL:    BALANCE/GAIT:  He is able to bring himself to a standing position.  However, he has a bilateral Trendelenburg gait with valgus deformity at both knees.  He will need to use his walker in order to avoid falling.  Without the walker, he is very, very unsteady.     SENSATION/STRENGTH:  Noted is that his basic neurologic exam is notable for 3+-4/5 hip abductor and flexor.  This accounts for the walking abnormalities that I am seeing, along with probable osteoarthritis of the knees.    ASSESSMENT/PLAN:  COPD.  This is fairly mild.  I will send him home with a p.r.n. Inhaler.    Chronic renal failure.   Last lab work, which was done on 03/20/2014, showed a BUN of 34, creatinine of 1.61.  His white count was 15.9 at the time with a normal differential.    Gait ataxia with proximal lower extremity weakness.  He is on chronic steroids for PMR and/or COPD.  I think he is also on methotrexate related to the polymyalgia rheumatica (?steroid-sparing agent).     Anxiety and depression.  This has been addressed in the facility.  He seems to be fairly stable now.    CPT CODE: 25852 (greater than 30 minutes spent in preparation of this discharge)

## 2014-04-29 ENCOUNTER — Observation Stay (HOSPITAL_COMMUNITY)
Admission: EM | Admit: 2014-04-29 | Discharge: 2014-05-02 | Disposition: A | Discharge status: Skilled Nursing Facility | Payer: Medicare Other | Attending: Internal Medicine | Admitting: Internal Medicine

## 2014-04-29 ENCOUNTER — Encounter (HOSPITAL_COMMUNITY): Payer: Self-pay | Admitting: Emergency Medicine

## 2014-04-29 ENCOUNTER — Emergency Department (HOSPITAL_COMMUNITY): Payer: Medicare Other

## 2014-04-29 ENCOUNTER — Inpatient Hospital Stay (HOSPITAL_COMMUNITY): Payer: Medicare Other

## 2014-04-29 DIAGNOSIS — I6529 Occlusion and stenosis of unspecified carotid artery: Secondary | ICD-10-CM | POA: Insufficient documentation

## 2014-04-29 DIAGNOSIS — Z96659 Presence of unspecified artificial knee joint: Secondary | ICD-10-CM | POA: Insufficient documentation

## 2014-04-29 DIAGNOSIS — J449 Chronic obstructive pulmonary disease, unspecified: Secondary | ICD-10-CM

## 2014-04-29 DIAGNOSIS — G9340 Encephalopathy, unspecified: Secondary | ICD-10-CM | POA: Insufficient documentation

## 2014-04-29 DIAGNOSIS — I350 Nonrheumatic aortic (valve) stenosis: Secondary | ICD-10-CM

## 2014-04-29 DIAGNOSIS — Z9181 History of falling: Secondary | ICD-10-CM | POA: Insufficient documentation

## 2014-04-29 DIAGNOSIS — G4733 Obstructive sleep apnea (adult) (pediatric): Secondary | ICD-10-CM | POA: Insufficient documentation

## 2014-04-29 DIAGNOSIS — R531 Weakness: Secondary | ICD-10-CM

## 2014-04-29 DIAGNOSIS — N184 Chronic kidney disease, stage 4 (severe): Secondary | ICD-10-CM | POA: Diagnosis present

## 2014-04-29 DIAGNOSIS — W19XXXA Unspecified fall, initial encounter: Secondary | ICD-10-CM

## 2014-04-29 DIAGNOSIS — Z8673 Personal history of transient ischemic attack (TIA), and cerebral infarction without residual deficits: Secondary | ICD-10-CM | POA: Insufficient documentation

## 2014-04-29 DIAGNOSIS — I951 Orthostatic hypotension: Secondary | ICD-10-CM | POA: Insufficient documentation

## 2014-04-29 DIAGNOSIS — Z8546 Personal history of malignant neoplasm of prostate: Secondary | ICD-10-CM | POA: Insufficient documentation

## 2014-04-29 DIAGNOSIS — R5383 Other fatigue: Secondary | ICD-10-CM | POA: Diagnosis present

## 2014-04-29 DIAGNOSIS — R443 Hallucinations, unspecified: Secondary | ICD-10-CM

## 2014-04-29 DIAGNOSIS — R51 Headache: Secondary | ICD-10-CM | POA: Insufficient documentation

## 2014-04-29 DIAGNOSIS — E119 Type 2 diabetes mellitus without complications: Secondary | ICD-10-CM | POA: Insufficient documentation

## 2014-04-29 DIAGNOSIS — I440 Atrioventricular block, first degree: Secondary | ICD-10-CM | POA: Insufficient documentation

## 2014-04-29 DIAGNOSIS — M353 Polymyalgia rheumatica: Secondary | ICD-10-CM | POA: Insufficient documentation

## 2014-04-29 DIAGNOSIS — I252 Old myocardial infarction: Secondary | ICD-10-CM | POA: Insufficient documentation

## 2014-04-29 DIAGNOSIS — Z87891 Personal history of nicotine dependence: Secondary | ICD-10-CM | POA: Insufficient documentation

## 2014-04-29 DIAGNOSIS — Z9089 Acquired absence of other organs: Secondary | ICD-10-CM | POA: Insufficient documentation

## 2014-04-29 DIAGNOSIS — I4891 Unspecified atrial fibrillation: Secondary | ICD-10-CM | POA: Insufficient documentation

## 2014-04-29 DIAGNOSIS — J4489 Other specified chronic obstructive pulmonary disease: Secondary | ICD-10-CM | POA: Insufficient documentation

## 2014-04-29 DIAGNOSIS — I359 Nonrheumatic aortic valve disorder, unspecified: Secondary | ICD-10-CM | POA: Insufficient documentation

## 2014-04-29 DIAGNOSIS — Z9079 Acquired absence of other genital organ(s): Secondary | ICD-10-CM | POA: Insufficient documentation

## 2014-04-29 DIAGNOSIS — R5381 Other malaise: Principal | ICD-10-CM | POA: Insufficient documentation

## 2014-04-29 DIAGNOSIS — J329 Chronic sinusitis, unspecified: Secondary | ICD-10-CM | POA: Diagnosis present

## 2014-04-29 DIAGNOSIS — R627 Adult failure to thrive: Secondary | ICD-10-CM | POA: Diagnosis present

## 2014-04-29 DIAGNOSIS — I2510 Atherosclerotic heart disease of native coronary artery without angina pectoris: Secondary | ICD-10-CM | POA: Insufficient documentation

## 2014-04-29 DIAGNOSIS — Z8701 Personal history of pneumonia (recurrent): Secondary | ICD-10-CM | POA: Insufficient documentation

## 2014-04-29 DIAGNOSIS — E039 Hypothyroidism, unspecified: Secondary | ICD-10-CM | POA: Insufficient documentation

## 2014-04-29 DIAGNOSIS — Z79899 Other long term (current) drug therapy: Secondary | ICD-10-CM | POA: Insufficient documentation

## 2014-04-29 DIAGNOSIS — E785 Hyperlipidemia, unspecified: Secondary | ICD-10-CM | POA: Insufficient documentation

## 2014-04-29 DIAGNOSIS — IMO0002 Reserved for concepts with insufficient information to code with codable children: Secondary | ICD-10-CM

## 2014-04-29 DIAGNOSIS — E669 Obesity, unspecified: Secondary | ICD-10-CM | POA: Insufficient documentation

## 2014-04-29 DIAGNOSIS — I480 Paroxysmal atrial fibrillation: Secondary | ICD-10-CM | POA: Diagnosis present

## 2014-04-29 LAB — CBC WITH DIFFERENTIAL/PLATELET
Basophils Absolute: 0 10*3/uL (ref 0.0–0.1)
Basophils Relative: 0 % (ref 0–1)
EOS ABS: 0.3 10*3/uL (ref 0.0–0.7)
EOS PCT: 4 % (ref 0–5)
HCT: 40.2 % (ref 39.0–52.0)
HEMOGLOBIN: 13.1 g/dL (ref 13.0–17.0)
LYMPHS ABS: 1.7 10*3/uL (ref 0.7–4.0)
Lymphocytes Relative: 22 % (ref 12–46)
MCH: 33.9 pg (ref 26.0–34.0)
MCHC: 32.6 g/dL (ref 30.0–36.0)
MCV: 103.9 fL — AB (ref 78.0–100.0)
MONOS PCT: 12 % (ref 3–12)
Monocytes Absolute: 1 10*3/uL (ref 0.1–1.0)
Neutro Abs: 5 10*3/uL (ref 1.7–7.7)
Neutrophils Relative %: 63 % (ref 43–77)
Platelets: 283 10*3/uL (ref 150–400)
RBC: 3.87 MIL/uL — AB (ref 4.22–5.81)
RDW: 14.6 % (ref 11.5–15.5)
WBC: 8 10*3/uL (ref 4.0–10.5)

## 2014-04-29 LAB — COMPREHENSIVE METABOLIC PANEL
ALT: 22 U/L (ref 0–53)
AST: 31 U/L (ref 0–37)
Albumin: 3.4 g/dL — ABNORMAL LOW (ref 3.5–5.2)
Alkaline Phosphatase: 68 U/L (ref 39–117)
BUN: 16 mg/dL (ref 6–23)
CO2: 26 mEq/L (ref 19–32)
Calcium: 8.9 mg/dL (ref 8.4–10.5)
Chloride: 101 mEq/L (ref 96–112)
Creatinine, Ser: 1.62 mg/dL — ABNORMAL HIGH (ref 0.50–1.35)
GFR calc Af Amer: 42 mL/min — ABNORMAL LOW (ref >90)
GFR calc non Af Amer: 36 mL/min — ABNORMAL LOW (ref >90)
Glucose, Bld: 99 mg/dL (ref 70–99)
POTASSIUM: 4.2 meq/L (ref 3.7–5.3)
Sodium: 138 mEq/L (ref 137–147)
TOTAL PROTEIN: 6.4 g/dL (ref 6.0–8.3)
Total Bilirubin: 0.5 mg/dL (ref 0.3–1.2)

## 2014-04-29 LAB — VITAMIN B12: VITAMIN B 12: 257 pg/mL (ref 211–911)

## 2014-04-29 LAB — BLOOD GAS, ARTERIAL
ACID-BASE EXCESS: 1.5 mmol/L (ref 0.0–2.0)
Bicarbonate: 25.9 mEq/L — ABNORMAL HIGH (ref 20.0–24.0)
Drawn by: 257701
O2 SAT: 92.3 %
PH ART: 7.401 (ref 7.350–7.450)
Patient temperature: 98.6
TCO2: 23.4 mmol/L (ref 0–100)
pCO2 arterial: 42.6 mmHg (ref 35.0–45.0)
pO2, Arterial: 65.2 mmHg — ABNORMAL LOW (ref 80.0–100.0)

## 2014-04-29 LAB — URINALYSIS, ROUTINE W REFLEX MICROSCOPIC
Bilirubin Urine: NEGATIVE
Glucose, UA: NEGATIVE mg/dL
Hgb urine dipstick: NEGATIVE
KETONES UR: NEGATIVE mg/dL
Leukocytes, UA: NEGATIVE
NITRITE: NEGATIVE
Protein, ur: NEGATIVE mg/dL
SPECIFIC GRAVITY, URINE: 1.014 (ref 1.005–1.030)
Urobilinogen, UA: 1 mg/dL (ref 0.0–1.0)
pH: 5.5 (ref 5.0–8.0)

## 2014-04-29 LAB — LACTIC ACID, PLASMA: Lactic Acid, Venous: 0.8 mmol/L (ref 0.5–2.2)

## 2014-04-29 LAB — T4, FREE: Free T4: 1.31 ng/dL (ref 0.80–1.80)

## 2014-04-29 LAB — TROPONIN I: Troponin I: <0.3 ng/mL (ref <0.30)

## 2014-04-29 LAB — TSH: TSH: 1.05 u[IU]/mL (ref 0.350–4.500)

## 2014-04-29 MED ORDER — TRAMADOL HCL 50 MG PO TABS
50.0000 mg | ORAL_TABLET | Freq: Four times a day (QID) | ORAL | Status: DC | PRN
  Administered 2014-04-29 – 2014-05-01 (×3): 50 mg via ORAL
  Filled 2014-04-29 (×3): qty 1

## 2014-04-29 MED ORDER — CLOPIDOGREL BISULFATE 75 MG PO TABS
75.0000 mg | ORAL_TABLET | Freq: Every evening | ORAL | Status: DC
  Administered 2014-04-29 – 2014-04-30 (×2): 75 mg via ORAL
  Filled 2014-04-29 (×4): qty 1

## 2014-04-29 MED ORDER — FOLIC ACID 1 MG PO TABS
1.0000 mg | ORAL_TABLET | Freq: Every morning | ORAL | Status: DC
Start: 2014-04-29 — End: 2014-05-02
  Administered 2014-04-29 – 2014-05-02 (×4): 1 mg via ORAL
  Filled 2014-04-29 (×4): qty 1

## 2014-04-29 MED ORDER — SODIUM CHLORIDE 0.9 % IV SOLN
INTRAVENOUS | Status: DC
  Administered 2014-04-29: 17:00:00 via INTRAVENOUS

## 2014-04-29 MED ORDER — HYDRALAZINE HCL 20 MG/ML IJ SOLN
10.0000 mg | Freq: Four times a day (QID) | INTRAMUSCULAR | Status: DC | PRN
  Administered 2014-04-29 – 2014-04-30 (×3): 10 mg via INTRAVENOUS
  Filled 2014-04-29 (×5): qty 0.5

## 2014-04-29 MED ORDER — SERTRALINE HCL 100 MG PO TABS
100.0000 mg | ORAL_TABLET | Freq: Every day | ORAL | Status: DC
  Administered 2014-04-29 – 2014-05-01 (×3): 100 mg via ORAL
  Filled 2014-04-29 (×4): qty 1

## 2014-04-29 MED ORDER — ALBUTEROL SULFATE (2.5 MG/3ML) 0.083% IN NEBU: 2.5000 mg | INHALATION_SOLUTION | Freq: Four times a day (QID) | RESPIRATORY_TRACT | Status: DC | PRN

## 2014-04-29 MED ORDER — ENOXAPARIN SODIUM 30 MG/0.3ML ~~LOC~~ SOLN
30.0000 mg | SUBCUTANEOUS | Status: DC
  Administered 2014-04-29: 30 mg via SUBCUTANEOUS
  Filled 2014-04-29 (×2): qty 0.3

## 2014-04-29 MED ORDER — LEVOTHYROXINE SODIUM 137 MCG PO TABS
137.0000 ug | ORAL_TABLET | Freq: Every day | ORAL | Status: DC
  Administered 2014-04-30 – 2014-05-02 (×3): 137 ug via ORAL
  Filled 2014-04-29 (×4): qty 1

## 2014-04-29 MED ORDER — ONDANSETRON HCL 4 MG PO TABS: 4.0000 mg | ORAL_TABLET | Freq: Four times a day (QID) | ORAL | Status: DC | PRN

## 2014-04-29 MED ORDER — POLYETHYLENE GLYCOL 3350 17 G PO PACK
17.0000 g | PACK | Freq: Every day | ORAL | Status: DC
  Administered 2014-04-29 – 2014-05-02 (×4): 17 g via ORAL
  Filled 2014-04-29 (×4): qty 1

## 2014-04-29 MED ORDER — ONDANSETRON HCL 4 MG/2ML IJ SOLN: 4.0000 mg | Freq: Four times a day (QID) | INTRAMUSCULAR | Status: DC | PRN

## 2014-04-29 MED ORDER — ACETAMINOPHEN 325 MG PO TABS: 650.0000 mg | ORAL_TABLET | Freq: Four times a day (QID) | ORAL | Status: DC | PRN

## 2014-04-29 MED ORDER — SODIUM CHLORIDE 0.9 % IV BOLUS (SEPSIS)
500.0000 mL | Freq: Once | INTRAVENOUS | Status: AC
  Administered 2014-04-29: 500 mL via INTRAVENOUS

## 2014-04-29 NOTE — ED Notes (Signed)
Initial Contact - pt resting soundly on stretcher, arousable to verbal stim.  Family at bedside.  Family reports pt not at baseline mentation, confused, "very sleepy lately".  Pt oriented to person/place.  Speaking full sentences, thick speech, baseline for pt per family.  Pt denies pain or other complaints.  MAEI.  NAD.

## 2014-04-29 NOTE — ED Notes (Signed)
Hospitalist at bedside 

## 2014-04-29 NOTE — ED Notes (Signed)
ED LAB [name] asked to draw blood after 2 unsuccessful attempts.

## 2014-04-29 NOTE — H&P (Signed)
[facility] History and Physical  [name] [id number] DOB: [birth year] DOA: 04/29/2014  Referring physician: EDP PCP: Walker Kehr, MD   Chief Complaint: weakness, lethargy  HPI: [name] is a 80 y.o. male with PMh of COPD, CKD, PMR, DM, TIA, CAD was just discharged home from [facility] last week after rehabilitation following admission for respiratory failure, pneumonia in the end of March. He's had hallucinations before in setting of infections only. History if provided by his son at bedside, who reports that he was doing very well when he went home last week, walked inside with a cane, has been having normal conversations with family members until Sunday.Sunday when his son and daughter came home they noticed a significant change notably being lethargic, tired, sleeping most of the day, poor Po intake. Today he was found on the floor by his caregiver, confused, hallucinating and picking at things in the air. No cough, congestion, no Abd pain, N/V/D, no fevers or chills. In ER, workup unremarkable   Review of Systems:  Constitutional:  No weight loss, night sweats, Fevers, chills, fatigue.  HEENT:  No headaches, Difficulty swallowing,Tooth/dental problems,Sore throat,  No sneezing, itching, ear ache, nasal congestion, post nasal drip,  Cardio-vascular:  No chest pain, Orthopnea, PND, swelling in lower extremities, anasarca, dizziness, palpitations  GI:  No heartburn, indigestion, abdominal pain, nausea, vomiting, diarrhea, change in bowel habits, loss of appetite  Resp:  No shortness of breath with exertion or at rest. No excess mucus, no productive cough, No non-productive cough, No coughing up of blood.No change in color of mucus.No wheezing.No chest wall deformity  Skin:  no rash or lesions.  GU:  no dysuria, change in color of urine, no urgency or frequency. No flank pain.  Musculoskeletal:  No joint pain or swelling. No decreased range of  motion. No back pain.  Psych:  No change in mood or affect. No depression or anxiety. No memory loss.   Past Medical History  Diagnosis Date  . CAD (coronary artery disease)     s/p NSTEMI and BMS stent diagonal07/09;  Lexiscan Myoview 9/13:  EF 62%, no ischemia  . AS (aortic stenosis)     a.  Echo 2009 mean gradient 47mm HG. AVA 2.18;  b.  Echo 4/11 mild AS mean gradient 78mm HG;  c.  Echo 04/2011: Mild LVH, EF 0000000, grade 1 diastolic dysfunction, mild aortic stenosis, mean gradient 14, mild LAE;  d. Echo 9/13: mod LVH, EF 50-55%, mild to mod AS, mean gradient 17 mmHg  . AF (paroxysmal atrial fibrillation)     Holter monitor 2.5 sec pauses  . Fatigue     CPX 11/09 VO2  14.4 (75% predicte)d, slope 35.  O2 pulse normal.  VO2 corrected for body weight 17.6.  Marland Kitchen Hypothyroidism   . Hyperlipidemia   . History of prostate cancer   . Allergic rhinitis   . DM (diabetes mellitus)     type II diet controlled  . Shingles   . Left carotid stenosis   . Vitamin B 12 deficiency   . Anemia   . Osteoarthritis   . Nephrolithiasis   . Obesity   . OSA (obstructive sleep apnea)     AHI 15 PSG 09/06/08  . Hx of colonoscopy   . Stroke   . Anxiety and depression   . PMR (polymyalgia rheumatica) 05/29/2012  . Hypogonadism male 05/29/2012  . Hyperparathyroidism 05/29/2012  . TIA (transient ischemic attack) 05/29/2012  . CKD (chronic kidney  disease) stage 4, GFR 15-29 ml/min 05/29/2012  . COPD (chronic obstructive pulmonary disease) 05/29/2012  . Myocardial infarction   . Cancer     prostate ca history   Past Surgical History  Procedure Laterality Date  . Appendectomy    . Hernia repair    . Right carpal tunnel release    . Lumbar spine surgery    . Cystectomy      neck  . Prostatectomy    . Vasectomy    . Total knee arthroplasty      right  . Cholecystectomy     Social History:  reports that he quit smoking about 55 years ago. His smoking use included Cigarettes. He has a 12.5 pack-year smoking  history. He quit smokeless tobacco use about 43 years ago. His smokeless tobacco use included Snuff and Chew. He reports that he does not drink alcohol or use illicit drugs.  Allergies  Allergen Reactions  . Bactrim [Sulfamethoxazole-Trimethoprim] Other (See Comments)    hallucinations  . Horse-Derived Products Hives    Family History  Problem Relation Age of Onset  . Stroke Mother 33  . Coronary artery disease      father, brother, sister  . Alcohol abuse Father 27     Prior to Admission medications   Medication Sig Start Date End Date Taking? Authorizing Provider  albuterol (PROVENTIL HFA;VENTOLIN HFA) 108 (90 BASE) MCG/ACT inhaler Inhale 2 puffs into the lungs every 6 (six) hours as needed for wheezing or shortness of breath. 11/29/12  Yes Biagio Borg, MD  cholecalciferol (VITAMIN D) 1000 UNITS tablet Take 1,000 Units by mouth at bedtime.    Yes Historical Provider, MD  clopidogrel (PLAVIX) 75 MG tablet Take 75 mg by mouth every evening.    Yes Historical Provider, MD  folic acid (FOLVITE) 1 MG tablet Take 1 mg by mouth every morning.    Yes Historical Provider, MD  Iron-Vitamins (GERITOL COMPLETE PO) Take 1 tablet by mouth every morning.   Yes Historical Provider, MD  levothyroxine (SYNTHROID, LEVOTHROID) 137 MCG tablet Take 137 mcg by mouth daily before breakfast.    Yes Historical Provider, MD  methotrexate (RHEUMATREX) 2.5 MG tablet Take 10 mg by mouth once a week. Takes 4 tablets weekly on Wednesday.   Caution:Chemotherapy. Protect from light.   Yes Historical Provider, MD  midodrine (PROAMATINE) 2.5 MG tablet Take 1 tablet (2.5 mg total) by mouth 3 (three) times daily with meals. 04/03/14  Yes Evie Lacks Plotnikov, MD  polyethylene glycol (MIRALAX / GLYCOLAX) packet Take 17 g by mouth daily.   Yes Historical Provider, MD  sertraline (ZOLOFT) 100 MG tablet Take 100 mg by mouth at bedtime.   Yes Historical Provider, MD  traMADol (ULTRAM) 50 MG tablet Take one tablet by mouth twice  daily for pain 04/03/14  Yes Tiffany L Reed, DO  vitamin C (ASCORBIC ACID) 500 MG tablet Take 500 mg by mouth at bedtime.   Yes Historical Provider, MD   Physical Exam: Filed Vitals:   04/29/14 1241  BP: 198/93  Pulse: 72  Temp:   Resp: 14    BP 198/93  Pulse 72  Temp(Src) 98.4 F (36.9 C) (Oral)  Resp 14  SpO2 97%  General:  Sleeping, arousible, answers few questions and goes back to sleep, no distress Eyes: PERRL, normal lids, irises & conjunctiva ENT: grossly normal hearing, lips & tongue Neck: no LAD, masses or thyromegaly Cardiovascular: RRR, no m/r/g. No LE edema. Respiratory: CTA bilaterally, no w/r/r. Normal  respiratory effort. Abdomen: soft, NT, ND, BS present Skin: no rash or induration seen on limited exam Musculoskeletal: grossly normal tone BUE/BLE Psychiatric: unable to assess Neurologic:moves all extremities, no localising signs          Labs on Admission:  Basic Metabolic Panel:  Recent Labs Lab 04/29/14 0834  NA 138  K 4.2  CL 101  CO2 26  GLUCOSE 99  BUN 16  CREATININE 1.62*  CALCIUM 8.9   Liver Function Tests:  Recent Labs Lab 04/29/14 0834  AST 31  ALT 22  ALKPHOS 68  BILITOT 0.5  PROT 6.4  ALBUMIN 3.4*   No results found for this basename: LIPASE, AMYLASE,  in the last 168 hours No results found for this basename: AMMONIA,  in the last 168 hours CBC:  Recent Labs Lab 04/29/14 0834  WBC 8.0  NEUTROABS 5.0  HGB 13.1  HCT 40.2  MCV 103.9*  PLT 283   Cardiac Enzymes:  Recent Labs Lab 04/29/14 0834  TROPONINI <0.30    BNP (last 3 results)  Recent Labs  03/03/14 1647  PROBNP 1635.0*   CBG: No results found for this basename: GLUCAP,  in the last 168 hours  Radiological Exams on Admission: Dg Chest 2 View  04/29/2014   CLINICAL DATA:  Fall, weakness and shortness of breath.  EXAM: CHEST - 2 VIEW  COMPARISON:  03/16/2014  FINDINGS: Stable mild cardiomegaly and tortuosity of the thoracic aorta. Bibasilar  atelectasis present without overt edema or airspace consolidation. No pleural fluid or pneumothorax is identified. The visualized skeletal structures are unremarkable.  IMPRESSION: Bibasilar atelectasis.   Electronically Signed   By: Aletta Edouard M.D.   On: 04/29/2014 09:40   Ct Head Wo Contrast  04/29/2014   CLINICAL DATA:  Status post fall.  EXAM: CT HEAD WITHOUT CONTRAST  CT CERVICAL SPINE WITHOUT CONTRAST  TECHNIQUE: Multidetector CT imaging of the head and cervical spine was performed following the standard protocol without intravenous contrast. Multiplanar CT image reconstructions of the cervical spine were also generated.  COMPARISON:  Head and cervical spine CT scan 03/12/2014.  FINDINGS: CT HEAD FINDINGS  Atrophy and chronic microvascular ischemic change are again seen. There is no evidence of acute intracranial abnormality including hemorrhage, infarct, mass lesion, mass effect, midline shift or abnormal extra-axial fluid collection. No hydrocephalus or pneumocephalus. The calvarium is intact.  CT CERVICAL SPINE FINDINGS  No fracture or malalignment of the cervical spine is identified. Multilevel spondylosis is again seen. Imaged lung parenchyma is clear.  IMPRESSION: No acute finding head or cervical spine. Stable compared to prior exam.   Electronically Signed   By: Inge Rise M.D.   On: 04/29/2014 09:29   Ct Cervical Spine Wo Contrast  04/29/2014   CLINICAL DATA:  Status post fall.  EXAM: CT HEAD WITHOUT CONTRAST  CT CERVICAL SPINE WITHOUT CONTRAST  TECHNIQUE: Multidetector CT imaging of the head and cervical spine was performed following the standard protocol without intravenous contrast. Multiplanar CT image reconstructions of the cervical spine were also generated.  COMPARISON:  Head and cervical spine CT scan 03/12/2014.  FINDINGS: CT HEAD FINDINGS  Atrophy and chronic microvascular ischemic change are again seen. There is no evidence of acute intracranial abnormality including  hemorrhage, infarct, mass lesion, mass effect, midline shift or abnormal extra-axial fluid collection. No hydrocephalus or pneumocephalus. The calvarium is intact.  CT CERVICAL SPINE FINDINGS  No fracture or malalignment of the cervical spine is identified. Multilevel spondylosis is again seen. Imaged  lung parenchyma is clear.  IMPRESSION: No acute finding head or cervical spine. Stable compared to prior exam.   Electronically Signed   By: Inge Rise M.D.   On: 04/29/2014 09:29    Assessment/Plan  1. Lethargy/Encephalopathy -etiology unclear, labs CXR/UA/CT head unremarkable -check ABG to r/o CO2 narcosis -could be brewing an infection, or could be sundowning or failure to thrive -will check MRI brain to r/o CVA, gentle IVF -monitor clinically for now -check TSH/B12 -hold Risperidone till mentation improves  2. CKD -stable at baseline  3. COPD -stable, nebs PRN  4. H/o PMR -hold methotrexate  5. Hypothyroidism -continue synthroid  6. H/o CAD, TIA -continue plavix  DVT proph: lovenox  Code Status: DNR Family Communication: d/w son at bedside Disposition Plan: inpatient  Time spent: 51min  Kaliq Lege [facility] Pager [phone]

## 2014-04-29 NOTE — ED Notes (Signed)
Bed: WA19 Expected date:  Expected time:  Means of arrival:  Comments: 

## 2014-04-29 NOTE — ED Notes (Signed)
Dr. Tomi Bamberger aware of elevated BP

## 2014-04-29 NOTE — ED Notes (Signed)
Charge nurse in the room with ultrasound attempting to access for lab specimens.

## 2014-04-29 NOTE — ED Notes (Signed)
Bed: BB40 Expected date:  Expected time:  Means of arrival:  Comments: ems- elderly, fall last night, heart block on ekg, hx of heart problems

## 2014-04-29 NOTE — ED Notes (Signed)
Per EMS- Patient's wife reports that she went to bed and left her husband in his chair, when she woke this AM she found her husband on the floor by his chair. He was confused and incontinent of urine. Patient c/o minor posterior neck pain. C-collar placed.

## 2014-04-29 NOTE — ED Notes (Signed)
Patient transported to X-ray 

## 2014-04-29 NOTE — ED Provider Notes (Signed)
CSN: [id number]     Arrival date & time 04/29/14  0804 History   First MD Initiated Contact with Patient 04/29/14 [phone]     Chief Complaint  Patient presents with  . Fall   Level V caveat for poor historian  (Consider location/radiation/quality/duration/timing/severity/associated sxs/prior Treatment) HPI Patient reports that he sometimes has blackout spells. He cannot tell me how long it has been happening. Evidently his wife found him on the floor by his chair this morning. He states he does not remember what happened. When asked how he feels he states he feels "rough" when asked what that means he states he has a dull headache which he has chronically, and he states he feels "foggy". He denies any specific new pain. He has chronic pain in his knees from arthritis.    PCP Dr Alain Marion  Past Medical History  Diagnosis Date  . CAD (coronary artery disease)     s/p NSTEMI and BMS stent diagonal07/09;  Lexiscan Myoview 9/13:  EF 62%, no ischemia  . AS (aortic stenosis)     a.  Echo 2009 mean gradient 19mm HG. AVA 2.18;  b.  Echo 4/11 mild AS mean gradient 66mm HG;  c.  Echo 04/2011: Mild LVH, EF 0000000, grade 1 diastolic dysfunction, mild aortic stenosis, mean gradient 14, mild LAE;  d. Echo 9/13: mod LVH, EF 50-55%, mild to mod AS, mean gradient 17 mmHg  . AF (paroxysmal atrial fibrillation)     Holter monitor 2.5 sec pauses  . Fatigue     CPX 11/09 VO2  14.4 (75% predicte)d, slope 35.  O2 pulse normal.  VO2 corrected for body weight 17.6.  Marland Kitchen Hypothyroidism   . Hyperlipidemia   . History of prostate cancer   . Allergic rhinitis   . DM (diabetes mellitus)     type II diet controlled  . Shingles   . Left carotid stenosis   . Vitamin B 12 deficiency   . Anemia   . Osteoarthritis   . Nephrolithiasis   . Obesity   . OSA (obstructive sleep apnea)     AHI 15 PSG 09/06/08  . Hx of colonoscopy   . Stroke   . Anxiety and depression   . PMR (polymyalgia rheumatica) 05/29/2012  .  Hypogonadism male 05/29/2012  . Hyperparathyroidism 05/29/2012  . TIA (transient ischemic attack) 05/29/2012  . CKD (chronic kidney disease) stage 4, GFR 15-29 ml/min 05/29/2012  . COPD (chronic obstructive pulmonary disease) 05/29/2012  . Myocardial infarction   . Cancer     prostate ca history   Past Surgical History  Procedure Laterality Date  . Appendectomy    . Hernia repair    . Right carpal tunnel release    . Lumbar spine surgery    . Cystectomy      neck  . Prostatectomy    . Vasectomy    . Total knee arthroplasty      right  . Cholecystectomy     Family History  Problem Relation Age of Onset  . Stroke Mother 71  . Coronary artery disease      father, brother, sister  . Alcohol abuse Father 2   History  Substance Use Topics  . Smoking status: Former Smoker -- 0.50 packs/day for 25 years    Types: Cigarettes    Quit date: 12/25/1958  . Smokeless tobacco: Former Systems developer    Types: Snuff, [name]    Quit date: 08/06/1970     Comment: quit in 1950s  .  Alcohol Use: No  lives at home Lives with spouse Uses walker/wheelchair  Review of Systems  All other systems reviewed and are negative.     Allergies  Bactrim and Horse-derived products  Home Medications   Prior to Admission medications   Medication Sig Start Date End Date Taking? Authorizing Provider  albuterol (PROVENTIL HFA;VENTOLIN HFA) 108 (90 BASE) MCG/ACT inhaler Inhale 2 puffs into the lungs every 6 (six) hours as needed for wheezing or shortness of breath. 11/29/12   Biagio Borg, MD  bisacodyl (DULCOLAX) 5 MG EC tablet Take 1 tablet (5 mg total) by mouth daily as needed for moderate constipation. 01/21/14   Janece Canterbury, MD  cholecalciferol (VITAMIN D) 1000 UNITS tablet Take 1,000 Units by mouth at bedtime.     Historical Provider, MD  clopidogrel (PLAVIX) 75 MG tablet Take 75 mg by mouth every evening.     Historical Provider, MD  Cyanocobalamin (VITAMIN B-12) 1000 MCG SUBL Place 1 tablet (1,000 mcg total)  under the tongue daily. 04/03/14   Evie Lacks Plotnikov, MD  desmopressin (DDAVP) 0.1 MG tablet Take 0.1 mg by mouth every evening.    Historical Provider, MD  folic acid (FOLVITE) 1 MG tablet Take 1 mg by mouth every morning.     Historical Provider, MD  furosemide (LASIX) 20 MG tablet Take 20 mg by mouth 2 (two) times daily.    Historical Provider, MD  hyoscyamine (LEVSIN, ANASPAZ) 0.125 MG tablet Take 0.125 mg by mouth 4 (four) times daily as needed for cramping.     Historical Provider, MD  levothyroxine (SYNTHROID, LEVOTHROID) 137 MCG tablet Take 137 mcg by mouth daily before breakfast.     Historical Provider, MD  Maltodextrin-Xanthan Gum ([place]) POWD As recommended 03/17/14   Hosie Poisson, MD  methotrexate (RHEUMATREX) 2.5 MG tablet Take 10 mg by mouth once a week. Takes 4 tablets weekly on Wednesday.   Caution:Chemotherapy. Protect from light.    Historical Provider, MD  methylPREDNISolone (MEDROL) 4 MG tablet Take 1 tablet (4 mg total) by mouth daily. qam 04/03/14   Evie Lacks Plotnikov, MD  midodrine (PROAMATINE) 2.5 MG tablet Take 1 tablet (2.5 mg total) by mouth 3 (three) times daily with meals. 04/03/14   Evie Lacks Plotnikov, MD  mirabegron ER (MYRBETRIQ) 25 MG TB24 tablet Take 25 mg by mouth at bedtime.    Historical Provider, MD  risperiDONE (RISPERDAL) 0.5 MG tablet Take 1 tablet (0.5 mg total) by mouth at bedtime. 04/03/14   Evie Lacks Plotnikov, MD  sertraline (ZOLOFT) 100 MG tablet Take 0.5 tablets (50 mg total) by mouth at bedtime. 04/03/14   Cassandria Anger, MD  traMADol Veatrice Bourbon) 50 MG tablet Take one tablet by mouth twice daily for pain 04/03/14   Tiffany L Reed, DO   BP 191/97  Pulse 71  Temp(Src) 98.4 F (36.9 C) (Oral)  SpO2 97%  Vital signs normal except hypertension  Physical Exam  Nursing note and vitals reviewed. Constitutional: He is oriented to person, place, and time. He appears well-developed and well-nourished.  Non-toxic appearance. He does not  appear ill. No distress.  Patient falls asleep while talking his speech is very difficult to understand.  HENT:  Head: Normocephalic and atraumatic.  Right Ear: External ear normal.  Left Ear: External ear normal.  Nose: Nose normal. No mucosal edema or rhinorrhea.  Mouth/Throat: Oropharynx is clear and moist and mucous membranes are normal. No dental abscesses or uvula swelling.  Eyes: Conjunctivae and EOM are  normal. Pupils are equal, round, and reactive to light.  Neck: Full passive range of motion without pain.  C-collar in place  Cardiovascular: Normal rate, regular rhythm and normal heart sounds.  Exam reveals no gallop and no friction rub.   No murmur heard. Pulmonary/Chest: Effort normal and breath sounds normal. No respiratory distress. He has no wheezes. He has no rhonchi. He has no rales. He exhibits no tenderness and no crepitus.  Abdominal: Soft. Normal appearance and bowel sounds are normal. He exhibits distension. There is no tenderness. There is no rebound and no guarding.  Musculoskeletal: Normal range of motion. He exhibits no edema and no tenderness.  Moves all extremities well. Has a skin tear on his left elbow  Neurological: He is alert and oriented to person, place, and time. He has normal strength. No cranial nerve deficit.  Skin: Skin is warm, dry and intact. No rash noted. No erythema. No pallor.  Psychiatric: He has a normal mood and affect. His speech is normal and behavior is normal. His mood appears not anxious.    ED Course  Procedures (including critical care time)    Review of his prior visits shows he was admitted in March 19 for altered mental status and was found to have healthcare associated pneumonia, he was admitted March 10 for atrial fibrillation with rapid ventricular response when he first presented with shortness of breath. He was admitted January 22 with community acquired pneumonia when he presented with hypertension and loss of consciousness.  He was admitted in November, 2014 with urinary frequency and altered mental status. He was admitted in August 2014 for a fall from near syncope. He was admitted in April 2014 for weakness and had community acquired pneumonia.  09:00 patient's son is here. He states patient was in rehabilitation at [facility] for a couple weeks and did very well. He was just discharged one week ago. Son states at that point patient was walking with a cane and easily walked from the garage into the house which was a couple of 100 feet. He reports when he saw him 3 days ago there was a marked change in his father. He states his mother needs total care and has 24 /7 in home help. They report the patient does not drink fluids like he should. The caregiver reports this morning patient was found next to a chair with his head propped up against a chair while lying on the floor. She reported to the son that the patient appeared to be hallucinating. Son states whenever this happens he has a UTI or dehydration. He feels his father may be dehydrated because he's not drinking fluids.  Patient given 500 cc bolus of IV fluids for possible dehydration which was the son's concern.  10:38 pt states he has been seeing things at night. Did not recognize his son. States he thought he was rabbit hunting but hasn't in 4-5 years. Does not know what hospital he was in today.  Nurses report when they got patient up to walk to the bathroom he got acutely weak and short of breath. They had to put in a wheelchair.  11:51 AM Dr. Fanny Bien states she will come see patient to decide if he needs to be admitted.  Labs Review Results for orders placed during the hospital encounter of 04/29/14  CBC WITH DIFFERENTIAL      Result Value Ref Range   WBC 8.0  4.0 - 10.5 K/uL   RBC 3.87 (*) 4.22 -  5.81 MIL/uL   Hemoglobin 13.1  13.0 - 17.0 g/dL   HCT 40.2  39.0 - 52.0 %   MCV 103.9 (*) 78.0 - 100.0 fL   MCH 33.9  26.0 - 34.0 pg   MCHC 32.6  30.0 -  36.0 g/dL   RDW 14.6  11.5 - 15.5 %   Platelets 283  150 - 400 K/uL   Neutrophils Relative % 63  43 - 77 %   Neutro Abs 5.0  1.7 - 7.7 K/uL   Lymphocytes Relative 22  12 - 46 %   Lymphs Abs 1.7  0.7 - 4.0 K/uL   Monocytes Relative 12  3 - 12 %   Monocytes Absolute 1.0  0.1 - 1.0 K/uL   Eosinophils Relative 4  0 - 5 %   Eosinophils Absolute 0.3  0.0 - 0.7 K/uL   Basophils Relative 0  0 - 1 %   Basophils Absolute 0.0  0.0 - 0.1 K/uL  COMPREHENSIVE METABOLIC PANEL      Result Value Ref Range   Sodium 138  137 - 147 mEq/L   Potassium 4.2  3.7 - 5.3 mEq/L   Chloride 101  96 - 112 mEq/L   CO2 26  19 - 32 mEq/L   Glucose, Bld 99  70 - 99 mg/dL   BUN 16  6 - 23 mg/dL   Creatinine, Ser 1.62 (*) 0.50 - 1.35 mg/dL   Calcium 8.9  8.4 - 10.5 mg/dL   Total Protein 6.4  6.0 - 8.3 g/dL   Albumin 3.4 (*) 3.5 - 5.2 g/dL   AST 31  0 - 37 U/L   ALT 22  0 - 53 U/L   Alkaline Phosphatase 68  39 - 117 U/L   Total Bilirubin 0.5  0.3 - 1.2 mg/dL   GFR calc non Af Amer 36 (*) >90 mL/min   GFR calc Af Amer 42 (*) >90 mL/min  TROPONIN I      Result Value Ref Range   Troponin I <0.30  <0.30 ng/mL  URINALYSIS, ROUTINE W REFLEX MICROSCOPIC      Result Value Ref Range   Color, Urine YELLOW  YELLOW   APPearance CLEAR  CLEAR   Specific Gravity, Urine 1.014  1.005 - 1.030   pH 5.5  5.0 - 8.0   Glucose, UA NEGATIVE  NEGATIVE mg/dL   Hgb urine dipstick NEGATIVE  NEGATIVE   Bilirubin Urine NEGATIVE  NEGATIVE   Ketones, ur NEGATIVE  NEGATIVE mg/dL   Protein, ur NEGATIVE  NEGATIVE mg/dL   Urobilinogen, UA 1.0  0.0 - 1.0 mg/dL   Nitrite NEGATIVE  NEGATIVE   Leukocytes, UA NEGATIVE  NEGATIVE  LACTIC ACID, PLASMA      Result Value Ref Range   Lactic Acid, Venous 0.8  0.5 - 2.2 mmol/L  BLOOD GAS, ARTERIAL      Result Value Ref Range   pH, Arterial 7.401  7.350 - 7.450   pCO2 arterial 42.6  35.0 - 45.0 mmHg   pO2, Arterial 65.2 (*) 80.0 - 100.0 mmHg   Bicarbonate 25.9 (*) 20.0 - 24.0 mEq/L   TCO2 23.4  0  - 100 mmol/L   Acid-Base Excess 1.5  0.0 - 2.0 mmol/L   O2 Saturation 92.3     Patient temperature 98.6     Collection site RIGHT RADIAL     Drawn by [id number]     Sample type ARTERIAL DRAW     Allens test (pass/fail) PASS  PASS  TSH      Result Value Ref Range   TSH 1.050  0.350 - 4.500 uIU/mL   Laboratory interpretation all normal except stable renal insuffic     Imaging Review Dg Chest 2 View  04/29/2014   CLINICAL DATA:  Fall, weakness and shortness of breath.  EXAM: CHEST - 2 VIEW  COMPARISON:  03/16/2014  FINDINGS: Stable mild cardiomegaly and tortuosity of the thoracic aorta. Bibasilar atelectasis present without overt edema or airspace consolidation. No pleural fluid or pneumothorax is identified. The visualized skeletal structures are unremarkable.  IMPRESSION: Bibasilar atelectasis.   Electronically Signed   By: Aletta Edouard M.D.   On: 04/29/2014 09:40   Ct Head Wo Contrast   Ct Cervical Spine Wo Contrast  04/29/2014   CLINICAL DATA:  Status post fall.  EXAM: CT HEAD WITHOUT CONTRAST  CT CERVICAL SPINE WITHOUT CONTRAST  TECHNIQUE: Multidetector CT imaging of the head and cervical spine was performed following the standard protocol without intravenous contrast. Multiplanar CT image reconstructions of the cervical spine were also generated.  COMPARISON:  Head and cervical spine CT scan 03/12/2014.  FINDINGS: CT HEAD FINDINGS  Atrophy and chronic microvascular ischemic change are again seen. There is no evidence of acute intracranial abnormality including hemorrhage, infarct, mass lesion, mass effect, midline shift or abnormal extra-axial fluid collection. No hydrocephalus or pneumocephalus. The calvarium is intact.  CT CERVICAL SPINE FINDINGS  No fracture or malalignment of the cervical spine is identified. Multilevel spondylosis is again seen. Imaged lung parenchyma is clear.  IMPRESSION: No acute finding head or cervical spine. Stable compared to prior exam.   Electronically Signed    By: Inge Rise M.D.   On: 04/29/2014 09:29     EKG Interpretation   Date/Time:  Wednesday Apr 29 2014 08:22:10 EDT Ventricular Rate:  70 PR Interval:  212 QRS Duration: 92 QT Interval:  410 QTC Calculation: 442 R Axis:   37 Text Interpretation:  Sinus rhythm with 1st degree A-V block with  Premature supraventricular complexes Otherwise normal ECG No significant  change since last tracing Confirmed by Neosha Switalski  MD-I, Adalin Vanderploeg ([id number]) on  04/29/2014 8:38:18 AM      MDM   Final diagnoses:  Fall  Hallucination  Weakness    Disposition per Dr Gala Murdoch  Rolland Porter, MD, Alanson Aly, MD 04/29/14 [phone]

## 2014-04-29 NOTE — ED Notes (Signed)
Pt transported to floor by med tech at this time, NAD upon leaving dept.

## 2014-04-29 NOTE — ED Notes (Signed)
Attempted to come draw pts blood x2  Pt not in room

## 2014-04-29 NOTE — ED Notes (Signed)
Bed: WA20 Expected date:  Expected time:  Means of arrival:  Comments: 

## 2014-04-29 NOTE — ED Notes (Signed)
Patient transported to CT 

## 2014-04-29 NOTE — ED Notes (Signed)
Rt aware of need for ABG.

## 2014-04-29 NOTE — ED Notes (Signed)
Pt able to ambulate to bathroom with moderate 1 person assist. After using restroom pt complained of dizziness and shortness of breath and was tranferred by wheelchair back to room

## 2014-04-30 LAB — BASIC METABOLIC PANEL
BUN: 15 mg/dL (ref 6–23)
CO2: 24 mEq/L (ref 19–32)
Calcium: 8.2 mg/dL — ABNORMAL LOW (ref 8.4–10.5)
Chloride: 103 mEq/L (ref 96–112)
Creatinine, Ser: 1.32 mg/dL (ref 0.50–1.35)
GFR calc Af Amer: 53 mL/min — ABNORMAL LOW (ref >90)
GFR, EST NON AFRICAN AMERICAN: 46 mL/min — AB (ref >90)
GLUCOSE: 96 mg/dL (ref 70–99)
POTASSIUM: 5.2 meq/L (ref 3.7–5.3)
SODIUM: 139 meq/L (ref 137–147)

## 2014-04-30 LAB — CBC
HCT: 38.1 % — ABNORMAL LOW (ref 39.0–52.0)
HEMOGLOBIN: 12.2 g/dL — AB (ref 13.0–17.0)
MCH: 33.2 pg (ref 26.0–34.0)
MCHC: 32 g/dL (ref 30.0–36.0)
MCV: 103.8 fL — ABNORMAL HIGH (ref 78.0–100.0)
PLATELETS: 270 10*3/uL (ref 150–400)
RBC: 3.67 MIL/uL — ABNORMAL LOW (ref 4.22–5.81)
RDW: 14.9 % (ref 11.5–15.5)
WBC: 8 10*3/uL (ref 4.0–10.5)

## 2014-04-30 LAB — TROPONIN I

## 2014-04-30 MED ORDER — ENOXAPARIN SODIUM 40 MG/0.4ML ~~LOC~~ SOLN
40.0000 mg | SUBCUTANEOUS | Status: DC
  Administered 2014-04-30: 40 mg via SUBCUTANEOUS
  Filled 2014-04-30 (×3): qty 0.4

## 2014-04-30 MED ORDER — AMOXICILLIN-POT CLAVULANATE 875-125 MG PO TABS
1.0000 | ORAL_TABLET | Freq: Two times a day (BID) | ORAL | Status: DC
  Administered 2014-04-30 – 2014-05-02 (×5): 1 via ORAL
  Filled 2014-04-30 (×6): qty 1

## 2014-04-30 MED ORDER — VITAMIN B-12 100 MCG PO TABS
100.0000 ug | ORAL_TABLET | Freq: Every day | ORAL | Status: DC
  Administered 2014-04-30 – 2014-05-02 (×3): 100 ug via ORAL
  Filled 2014-04-30 (×3): qty 1

## 2014-04-30 NOTE — Progress Notes (Signed)
Clinical Social Work  Patient was discussed during progression meeting. MD reports that patient recently DC from SNF and plans to order SNF tomorrow in order to evaluate patient's needs. CSW went to room to meet with patient but patient currently sleeping. CSW will follow up at later time.  [place], [place] [phone]

## 2014-04-30 NOTE — Progress Notes (Signed)
PROGRESS NOTE  [name] CZY:[id number] DOB: 05/03/1923 DOA: 04/29/2014 PCP: Walker Kehr, MD  HPI: [name] is a 80 y.o. male with PMh of COPD, CKD, PMR, DM, TIA, CAD was just discharged home from [facility] rehab last week after rehabilitation following admission for respiratory failure, pneumonia in the end of March.  He's had hallucinations before in setting of infections only. History if provided by his son at bedside, who reports that he was doing very well when he went home last week, walked inside with a cane, has been having normal conversations with family members until Sunday.Sunday when his son and daughter came home they noticed a significant change notably being lethargic, tired, sleeping most of the day, poor Po intake. Today he was found on the floor by his caregiver, confused, hallucinating and picking at things in the air. No cough, congestion, no Abd pain, N/V/D, no fevers or chills. In ER, workup unremarkable  Assessment/Plan:  Lethargy/Encephalopathy - etiology unclear, labs CXR/UA/CT head unremarkable, possible sinusitis as patient endorses sinus pressure today. ABD with mild hypoxia but without hypercarbia. MRI brain negative, TSH normal, B12 normal (will start B12 supplements because on the low side) - hold Risperidone till mentation improves  CKD - stable at baseline, improved with IVF COPD - stable, no wheezing no cough, nebs PRN  H/o PMR - hold methotrexate  Hypothyroidism - continue synthroid  H/o CAD, TIA - continue plavix Orthostatic hypotension - he is on Midodrine, during previous hospitalizations nephrology recommended to check BP while standing upright as his supine pressures remain elevated but he is in fact orthostatic.   Diet: heart Fluids: NS DVT Prophylaxis: lovenox   Code Status: DNR Family Communication: son Rush Landmark over the phone Disposition Plan: inpatient  Consultants:  none  Procedures:  none   Antibiotics Augmentin 5/7  >>  HPI/Subjective: No complaints specifically, tells me that he has chest pain but for the past 5 years, does endorse sinus congestion and sinus pressure since last week.   Objective: Filed Vitals:   04/29/14 1400 04/29/14 1434 04/29/14 2112 04/30/14 0506  BP: 191/83 173/73 167/75 193/91  Pulse: 71 86 96 76  Temp:  97.6 F (36.4 C) 98.2 F (36.8 C) 97.8 F (36.6 C)  TempSrc:  Oral Oral Oral  Resp: 16 20 20 18   Height:  5\' 8"  (1.727 m)    Weight:  93.033 kg (205 lb 1.6 oz)    SpO2: 96% 96% 97% 95%    Intake/Output Summary (Last 24 hours) at 04/30/14 1101 Last data filed at 04/30/14 0846  Gross per 24 hour  Intake 888.33 ml  Output    375 ml  Net 513.33 ml   Filed Weights   04/29/14 1434  Weight: 93.033 kg (205 lb 1.6 oz)    Exam:   General:  NAD  ENT: sinus point tenderness frontal sinuses  Cardiovascular: regular rate and rhythm, without MRG  Respiratory: good air movement, clear to auscultation throughout, no wheezing, ronchi or rales  Abdomen: soft, not tender to palpation, positive bowel sounds  MSK: no peripheral edema  Neuro: non focal  Data Reviewed: Basic Metabolic Panel:  Recent Labs Lab 04/29/14 0834 04/30/14 0415  NA 138 139  K 4.2 5.2  CL 101 103  CO2 26 24  GLUCOSE 99 96  BUN 16 15  CREATININE 1.62* 1.32  CALCIUM 8.9 8.2*   Liver Function Tests:  Recent Labs Lab 04/29/14 0834  AST 31  ALT 22  ALKPHOS 68  BILITOT 0.5  PROT 6.4  ALBUMIN 3.4*   CBC:  Recent Labs Lab 04/29/14 0834 04/30/14 0415  WBC 8.0 8.0  NEUTROABS 5.0  --   HGB 13.1 12.2*  HCT 40.2 38.1*  MCV 103.9* 103.8*  PLT 283 270   Cardiac Enzymes:  Recent Labs Lab 04/29/14 0834 04/30/14 0930  TROPONINI <0.30 <0.30   BNP (last 3 results)  Recent Labs  03/03/14 1647  PROBNP 1635.0*    Studies: Dg Chest 2 View  04/29/2014   CLINICAL DATA:  Fall, weakness and shortness of breath.  EXAM: CHEST - 2 VIEW  COMPARISON:  03/16/2014  FINDINGS: Stable  mild cardiomegaly and tortuosity of the thoracic aorta. Bibasilar atelectasis present without overt edema or airspace consolidation. No pleural fluid or pneumothorax is identified. The visualized skeletal structures are unremarkable.  IMPRESSION: Bibasilar atelectasis.   Electronically Signed   By: Aletta Edouard M.D.   On: 04/29/2014 09:40   Ct Head Wo Contrast  04/29/2014   CLINICAL DATA:  Status post fall.  EXAM: CT HEAD WITHOUT CONTRAST  CT CERVICAL SPINE WITHOUT CONTRAST  TECHNIQUE: Multidetector CT imaging of the head and cervical spine was performed following the standard protocol without intravenous contrast. Multiplanar CT image reconstructions of the cervical spine were also generated.  COMPARISON:  Head and cervical spine CT scan 03/12/2014.  FINDINGS: CT HEAD FINDINGS  Atrophy and chronic microvascular ischemic change are again seen. There is no evidence of acute intracranial abnormality including hemorrhage, infarct, mass lesion, mass effect, midline shift or abnormal extra-axial fluid collection. No hydrocephalus or pneumocephalus. The calvarium is intact.  CT CERVICAL SPINE FINDINGS  No fracture or malalignment of the cervical spine is identified. Multilevel spondylosis is again seen. Imaged lung parenchyma is clear.  IMPRESSION: No acute finding head or cervical spine. Stable compared to prior exam.   Electronically Signed   By: Inge Rise M.D.   On: 04/29/2014 09:29   Ct Cervical Spine Wo Contrast  04/29/2014   CLINICAL DATA:  Status post fall.  EXAM: CT HEAD WITHOUT CONTRAST  CT CERVICAL SPINE WITHOUT CONTRAST  TECHNIQUE: Multidetector CT imaging of the head and cervical spine was performed following the standard protocol without intravenous contrast. Multiplanar CT image reconstructions of the cervical spine were also generated.  COMPARISON:  Head and cervical spine CT scan 03/12/2014.  FINDINGS: CT HEAD FINDINGS  Atrophy and chronic microvascular ischemic change are again seen. There  is no evidence of acute intracranial abnormality including hemorrhage, infarct, mass lesion, mass effect, midline shift or abnormal extra-axial fluid collection. No hydrocephalus or pneumocephalus. The calvarium is intact.  CT CERVICAL SPINE FINDINGS  No fracture or malalignment of the cervical spine is identified. Multilevel spondylosis is again seen. Imaged lung parenchyma is clear.  IMPRESSION: No acute finding head or cervical spine. Stable compared to prior exam.   Electronically Signed   By: Inge Rise M.D.   On: 04/29/2014 09:29   Mr Brain Wo Contrast  04/29/2014   CLINICAL DATA:  Lethargy.  Weakness.  Confusion.  Recent fall.  EXAM: MRI HEAD WITHOUT CONTRAST  TECHNIQUE: Multiplanar, multiecho pulse sequences of the brain and surrounding structures were obtained without intravenous contrast.  COMPARISON:  Head CT same day.  MRI 12/29/2010.  FINDINGS: Diffusion imaging does not show any acute or subacute infarction. There is advanced generalized brain atrophy. There are a few old small vessel insults within the pons. No focal cerebellar infarction. The cerebral hemispheres show mild chronic small-vessel change of the  deep and subcortical white matter no large vessel territory infarction. No mass lesion, hemorrhage, hydrocephalus or extra-axial collection. No pituitary mass. No inflammatory sinus disease.  IMPRESSION: No acute finding. Advanced generalized brain atrophy. Mild chronic small-vessel changes.   Electronically Signed   By: Nelson Chimes M.D.   On: 04/29/2014 16:54    Scheduled Meds: . amoxicillin-clavulanate  1 tablet Oral Q12H  . clopidogrel  75 mg Oral QPM  . enoxaparin (LOVENOX) injection  40 mg Subcutaneous Q24H  . folic acid  1 mg Oral q morning - 10a  . levothyroxine  137 mcg Oral QAC breakfast  . polyethylene glycol  17 g Oral Daily  . sertraline  100 mg Oral QHS  . vitamin B-12  100 mcg Oral Daily   Continuous Infusions: . sodium chloride 50 mL/hr at 04/29/14 1702     Active Problems:   AF (paroxysmal atrial fibrillation)   CKD (chronic kidney disease) stage 4, GFR 15-29 ml/min   COPD (chronic obstructive pulmonary disease)   PMR (polymyalgia rheumatica)   Lethargy   Failure to thrive   Time spent: 35  This note has been created with Surveyor, quantity. Any transcriptional errors are unintentional.   Marzetta Board, MD [facility] Pager [phone]. If 7 PM - 7 AM, please contact night-coverage at [email or web address], password [facility] 04/30/2014, 11:02 AM  LOS: 1 day

## 2014-05-01 LAB — BASIC METABOLIC PANEL
BUN: 11 mg/dL (ref 6–23)
CHLORIDE: 103 meq/L (ref 96–112)
CO2: 21 meq/L (ref 19–32)
CREATININE: 1.25 mg/dL (ref 0.50–1.35)
Calcium: 8.8 mg/dL (ref 8.4–10.5)
GFR calc non Af Amer: 49 mL/min — ABNORMAL LOW (ref >90)
GFR, EST AFRICAN AMERICAN: 57 mL/min — AB (ref >90)
Glucose, Bld: 118 mg/dL — ABNORMAL HIGH (ref 70–99)
POTASSIUM: 4.2 meq/L (ref 3.7–5.3)
SODIUM: 139 meq/L (ref 137–147)

## 2014-05-01 LAB — CBC
HCT: 40.6 % (ref 39.0–52.0)
Hemoglobin: 13.2 g/dL (ref 13.0–17.0)
MCH: 33.6 pg (ref 26.0–34.0)
MCHC: 32.5 g/dL (ref 30.0–36.0)
MCV: 103.3 fL — ABNORMAL HIGH (ref 78.0–100.0)
PLATELETS: 322 10*3/uL (ref 150–400)
RBC: 3.93 MIL/uL — AB (ref 4.22–5.81)
RDW: 14.9 % (ref 11.5–15.5)
WBC: 9 10*3/uL (ref 4.0–10.5)

## 2014-05-01 MED ORDER — TRAZODONE HCL 50 MG PO TABS
50.0000 mg | ORAL_TABLET | Freq: Once | ORAL | Status: AC
  Administered 2014-05-02: 50 mg via ORAL
  Filled 2014-05-01: qty 1

## 2014-05-01 NOTE — Progress Notes (Signed)
Clinical Social Work Department BRIEF PSYCHOSOCIAL ASSESSMENT 05/01/2014  Patient:  [name], [name]     Account Number:  [id number]     [place] date:  04/29/2014  Clinical Social Worker:  Earlie Server  Date/Time:  05/01/2014 02:25 PM  Referred by:  Physician  Date Referred:  05/01/2014 Referred for  SNF Placement   Other Referral:   Interview type:  Family Other interview type:    PSYCHOSOCIAL DATA Living Status:  FAMILY Admitted from facility:   Level of care:   Primary support name:  Bill Primary support relationship to patient:  CHILD, ADULT Degree of support available:   Strong    CURRENT CONCERNS Current Concerns  Post-Acute Placement   Other Concerns:    SOCIAL WORK ASSESSMENT / PLAN CSW received referral to assist with DC planning. Patient remains confused and unable to fully participate in assessment so CSW spoke with son via phone.    Son reports that patient currently lives at home with wife and caregivers. Son is unsure about DC plans but is aware that due to observation status that Medicare will not cover SNF placement and that patient would have to pay privately. Son reports that patient enjoys staying at [facility] and was talking about returning for rehab. Son reports he will need to talk with patient and family re: financial burden of paying for SNF prior to making any decisions. Son agreeable to talk with patient and make a decision by this afternoon.    CSW completed FL2 and called [facility]. Son previously spoke with SNF and agreeable for CSW to contact re: possible placement.   Assessment/plan status:  Psychosocial Support/Ongoing Assessment of Needs Other assessment/ plan:   Information/referral to community resources:   SNF information    PATIENT'S/FAMILY'S RESPONSE TO PLAN OF CARE: Patient unable to fully participate in assessment. Patient's son very engaged and feels that patient could benefit from SNF but unsure about patient's feelings  towards paying privately. Son agreeable to talk with patient and family in order to determine best DC plan.       [place], [place] [phone]

## 2014-05-01 NOTE — Progress Notes (Signed)
PROGRESS NOTE   [name] TMA:[id number] DOB: 1923-01-12 DOA: 04/29/2014 PCP: Don Kehr, MD  HPI:  [name] is a 80 y.o. male with PMh of COPD, CKD, PMR, DM, TIA, CAD was just discharged home from [facility] rehab last week after rehabilitation following admission for respiratory failure, pneumonia in the end of March. He's had hallucinations before in setting of infections only. History if provided by his son at bedside, who reports that he was doing very well when he went home last week, walked inside with a cane, has been having normal conversations with family members until Sunday.Sunday when his son and daughter came home they noticed a significant change notably being lethargic, tired, sleeping most of the day, poor Po intake. Today he was found on the floor by his caregiver, confused, hallucinating and picking at things in the air. No cough, congestion, no Abd pain, N/V/D, no fevers or chills. In ER, workup unremarkable  Assessment/Plan:  Lethargy/Encephalopathy - etiology unclear, labs CXR/UA/CT head unremarkable, possible sinusitis as patient endorses sinus pressure. ABG with mild hypoxia but without hypercarbia. MRI brain negative, TSH normal, B12 normal (will start B12 supplements because on the low side) - improved today and very close to baseline. Will ask for PT evaluation today. - continue Augmentin  Sinusitis - started on Augmentin 5/7, improved sinus pressure, mental status better.   CKD - stable at baseline, improved with IVF  COPD - stable, no wheezing no cough, nebs PRN   H/o PMR - hold methotrexate   Hypothyroidism - continue synthroid   H/o CAD, TIA - continue plavix  Orthostatic hypotension - he is on Midodrine, during previous hospitalizations nephrology recommended to check BP while standing upright as his supine pressures remain elevated but he is in fact orthostatic.   Diet: heart Fluids: NS DVT Prophylaxis: lovenox   Code Status:  DNR Family Communication: son Rush Landmark over the phone Disposition Plan: inpatient  Consultants:  none  Procedures:  none   Antibiotics Augmentin 5/7 >>  HPI/Subjective: - feels much better, his sinus pressure improving, no longer confused, wants to go home tomorrow.   Objective: Filed Vitals:   04/30/14 1314 04/30/14 2226 04/30/14 2348 05/01/14 0641  BP: 160/78 196/99 154/76 185/89  Pulse: 70 64  74  Temp: 98.2 F (36.8 C) 98 F (36.7 C)  97.8 F (36.6 C)  TempSrc: Oral Oral  Oral  Resp: 18 20 20 20   Height:      Weight:      SpO2: 97% 98%  96%    Intake/Output Summary (Last 24 hours) at 05/01/14 1241 Last data filed at 05/01/14 0200  Gross per 24 hour  Intake    200 ml  Output   1200 ml  Net  -1000 ml   Filed Weights   04/29/14 1434  Weight: 93.033 kg (205 lb 1.6 oz)    Exam:   General:  NAD  ENT: sinus point tenderness frontal sinuses improved  Cardiovascular: regular rate and rhythm, without MRG  Respiratory: good air movement, clear to auscultation throughout, no wheezing, ronchi or rales  Abdomen: soft, not tender to palpation, positive bowel sounds  MSK: no peripheral edema  Neuro: non focal  Data Reviewed: Basic Metabolic Panel:  Recent Labs Lab 04/29/14 0834 04/30/14 0415 05/01/14 0426  NA 138 139 139  K 4.2 5.2 4.2  CL 101 103 103  CO2 26 24 21   GLUCOSE 99 96 118*  BUN 16 15 11   CREATININE 1.62* 1.32  1.25  CALCIUM 8.9 8.2* 8.8   Liver Function Tests:  Recent Labs Lab 04/29/14 0834  AST 31  ALT 22  ALKPHOS 68  BILITOT 0.5  PROT 6.4  ALBUMIN 3.4*   CBC:  Recent Labs Lab 04/29/14 0834 04/30/14 0415 05/01/14 0426  WBC 8.0 8.0 9.0  NEUTROABS 5.0  --   --   HGB 13.1 12.2* 13.2  HCT 40.2 38.1* 40.6  MCV 103.9* 103.8* 103.3*  PLT 283 270 322   Cardiac Enzymes:  Recent Labs Lab 04/29/14 0834 04/30/14 0930  TROPONINI <0.30 <0.30   BNP (last 3 results)  Recent Labs  03/03/14 1647  PROBNP 1635.0*     Studies: Mr Brain Wo Contrast  04/29/2014   CLINICAL DATA:  Lethargy.  Weakness.  Confusion.  Recent fall.  EXAM: MRI HEAD WITHOUT CONTRAST  TECHNIQUE: Multiplanar, multiecho pulse sequences of the brain and surrounding structures were obtained without intravenous contrast.  COMPARISON:  Head CT same day.  MRI 12/29/2010.  FINDINGS: Diffusion imaging does not show any acute or subacute infarction. There is advanced generalized brain atrophy. There are a few old small vessel insults within the pons. No focal cerebellar infarction. The cerebral hemispheres show mild chronic small-vessel change of the deep and subcortical white matter no large vessel territory infarction. No mass lesion, hemorrhage, hydrocephalus or extra-axial collection. No pituitary mass. No inflammatory sinus disease.  IMPRESSION: No acute finding. Advanced generalized brain atrophy. Mild chronic small-vessel changes.   Electronically Signed   By: Nelson Chimes M.D.   On: 04/29/2014 16:54    Scheduled Meds: . amoxicillin-clavulanate  1 tablet Oral Q12H  . clopidogrel  75 mg Oral QPM  . enoxaparin (LOVENOX) injection  40 mg Subcutaneous Q24H  . folic acid  1 mg Oral q morning - 10a  . levothyroxine  137 mcg Oral QAC breakfast  . polyethylene glycol  17 g Oral Daily  . sertraline  100 mg Oral QHS  . vitamin B-12  100 mcg Oral Daily   Continuous Infusions:    Active Problems:   AF (paroxysmal atrial fibrillation)   CKD (chronic kidney disease) stage 4, GFR 15-29 ml/min   COPD (chronic obstructive pulmonary disease)   PMR (polymyalgia rheumatica)   Lethargy   Failure to thrive   Time spent: 25  This note has been created with Surveyor, quantity. Any transcriptional errors are unintentional.   Marzetta Board, MD [facility] Pager [phone]. If 7 PM - 7 AM, please contact night-coverage at [email or web address], password [facility] 05/01/2014, 12:41 PM  LOS: 2 days

## 2014-05-01 NOTE — Progress Notes (Signed)
Clinical Social Work  Patient and family agreeable to placement at [facility] under private pay. CSW spoke with SNF who can accept over the weekend and reports since patient has recently DC from their facility they will accept back and bill his Medicare. CSW was under the impression that patient had to have a hospital stay in the past 30 days in order to qualify for SNF but SNF is agreeing to bill Medicare again. CSW explained this information to son who is agreeable of plans but aware that private pay might have to happen if Medicare denies to pay for stay. Son to complete paperwork today and weekend CSW can DC if medically stable. Weekend CSW to contact SW Kenney Houseman) at [facility] for weekend DC.  [place], [place] [phone]

## 2014-05-01 NOTE — Progress Notes (Signed)
Clinical Social Work  CSW went to room to meet with patient. Patient confused and unable to fully participate in assessment. CSW called patient's son and left a message with son with CSW contact information included.  [place], [place] [phone]

## 2014-05-01 NOTE — Progress Notes (Signed)
UR completed 

## 2014-05-01 NOTE — Progress Notes (Signed)
Pt's BP elevated in am. Notified Dr. Cruzita Lederer. He stated not to give hydralazine due to the patient having orthostatic hypotension.

## 2014-05-01 NOTE — Evaluation (Signed)
Physical Therapy Evaluation Patient Details Name: [name] MRN: [id number] DOB: [birth year] Today's Date: 05/01/2014   History of Present Illness  Pt is an 80 year old male with past medical history of COPD, CKD, PMR, DM, TIA, CAD, MI, CVA, TIA, orthostatic hypotension and admitted 5/6 for lethargy/encephalopathy. MRI negative for acute finding.  Pt with recent disharge from [facility].  Clinical Impression  Pt currently with functional limitations due to the deficits listed below (see PT Problem List). Pt will benefit from skilled PT to increase their independence and safety with mobility to allow discharge to the venue listed below.  Pt presents with limited mobility requiring assist for transfers, weakness, poor activity tolerance, also with increased dizziness upon standing.  Pt would benefit from ST-SNF upon d/c.  Pt reports trying to figure out finances, also with spouse with Alzheimer's and uncertain of d/c plans at this time as he reports his main focus is making sure his spouse is taken care of.     Follow Up Recommendations SNF    Equipment Recommendations  None recommended by PT    Recommendations for Other Services       Precautions / Restrictions Precautions Precautions: Fall Precaution Comments: orthostatic hypotension      Mobility  Bed Mobility Overal bed mobility: Needs Assistance Bed Mobility: Supine to Sit;Sit to Supine     Supine to sit: Max assist Sit to supine: Min assist   General bed mobility comments: assist for trunk upright and use of bed pad to come to EOB sitting, assist to bring LEs onto bed  Transfers Overall transfer level: Needs assistance Equipment used: Rolling walker (2 wheeled) Transfers: Sit to/from [facility] Sit to Stand: Mod assist;+2 physical assistance;From elevated surface Stand pivot transfers: Mod assist;+2 physical assistance;From elevated surface       General transfer comment: pt required verbal cues  for safety and step by step instruction for pivoting and safe use of RW, pt reported dizziness which became worse upon standing so brought BSC to pt to perform pivot after dizziness subsided (pt states he has hx of this however not recently this worse)  Ambulation/Gait Ambulation/Gait assistance:  (pt felt unable due to dizziness)              Stairs            Wheelchair Mobility    Modified Rankin (Stroke Patients Only)       Balance                                             Pertinent Vitals/Pain No c/o pain, per chart review: orthostatic hypotension and pt dizzy upon standing so did not ambulate; pt attempting to get to bathroom so utilized [place] expects to be discharged to:: Private residence Living Arrangements: Spouse/significant other Available Help at Discharge: Available 24 hours/day;Personal care attendant (spouse has personal care attendent due to Alzheimers) Type of Home: House Home Access: Ramped entrance     Home Layout: One level Home Equipment: Environmental consultant - 2 wheels;Cane - single point      Prior Function Level of Independence: Needs assistance   Gait / Transfers Assistance Needed: usually household ambulator with assistive device, recently at rehab           Hand Dominance        Extremity/Trunk Assessment  Upper Extremity Assessment: Generalized weakness           Lower Extremity Assessment: Generalized weakness         Communication   Communication: HOH  Cognition     Overall Cognitive Status: Impaired/Different from baseline Area of Impairment: Following commands       Following Commands: Follows one step commands with increased time       General Comments: pt having hallucinations: hearing a dog and seeing water and/or pee on the floor (floor was dry)    General Comments      Exercises        Assessment/Plan    PT Assessment Patient needs continued PT  services  PT Diagnosis Difficulty walking;Generalized weakness   PT Problem List Decreased strength;Decreased balance;Decreased activity tolerance;Decreased mobility;Decreased safety awareness;Decreased knowledge of use of DME  PT Treatment Interventions DME instruction;Gait training;Functional mobility training;Patient/family education;Therapeutic activities;Therapeutic exercise;Balance training   PT Goals (Current goals can be found in the Care Plan section) Acute Rehab PT Goals PT Goal Formulation: With patient Time For Goal Achievement: 05/08/14 Potential to Achieve Goals: Fair    Frequency Min 3X/week   Barriers to discharge        Co-evaluation               End of Session Equipment Utilized During Treatment: Gait belt Activity Tolerance: Patient limited by fatigue;Other (comment) (limited by dizziness) Patient left: in bed;with call bell/phone within reach;with bed alarm set      Functional Assessment Tool Used: clinical judgement Functional Limitation: Mobility: Walking and moving around Mobility: Walking and Moving Around Current Status (U9323): At least 40 percent but less than 60 percent impaired, limited or restricted Mobility: Walking and Moving Around Goal Status [phone]): At least 1 percent but less than 20 percent impaired, limited or restricted    Time: 2025-4270 PT Time Calculation (min): 20 min   Charges:   PT Evaluation $Initial PT Evaluation Tier I: 1 Procedure PT Treatments $Therapeutic Activity: 8-22 mins   PT G Codes:   Functional Assessment Tool Used: clinical judgement Functional Limitation: Mobility: Walking and moving around    [employer] 05/01/2014, 2:44 PM Carmelia Bake, PT, DPT 05/01/2014 Pager: [phone]

## 2014-05-01 NOTE — Progress Notes (Addendum)
Clinical Social Work Department CLINICAL SOCIAL WORK PLACEMENT NOTE 05/01/2014  Patient:  [name], [name]  Account Number:  [id number] [place] date:  04/29/2014  Clinical Social Worker:  Sindy Messing, LCSW  Date/time:  05/01/2014 02:30 PM  Clinical Social Work is seeking post-discharge placement for this patient at the following level of care:   SKILLED NURSING   (*CSW will update this form in Epic as items are completed)   05/01/2014  Patient/family provided with [place] Department of Clinical Social Work's list of facilities offering this level of care within the geographic area requested by the patient (or if unable, by the patient's family).  05/01/2014  Patient/family informed of their freedom to choose among providers that offer the needed level of care, that participate in Medicare, Medicaid or managed care program needed by the patient, have an available bed and are willing to accept the patient.  05/01/2014  Patient/family informed of [facility]' ownership interest in [facility], as well as of the fact that they are under no obligation to receive care at this facility.  PASARR submitted to EDS on existing # PASARR number received from EDS on   FL2 transmitted to all facilities in geographic area requested by pt/family on  05/01/2014 FL2 transmitted to all facilities within larger geographic area on   Patient informed that his/her managed care company has contracts with or will negotiate with  certain facilities, including the following:     Patient/family informed of bed offers received:  05/01/14 Patient chooses bed at [facility] Physician recommends and patient chooses bed at    Patient to be transferred to  on   Patient to be transferred to facility by   The following physician request were entered in Epic:   Additional Comments:

## 2014-05-02 DIAGNOSIS — G9340 Encephalopathy, unspecified: Secondary | ICD-10-CM | POA: Diagnosis present

## 2014-05-02 DIAGNOSIS — J329 Chronic sinusitis, unspecified: Secondary | ICD-10-CM | POA: Diagnosis present

## 2014-05-02 DIAGNOSIS — I951 Orthostatic hypotension: Secondary | ICD-10-CM | POA: Diagnosis present

## 2014-05-02 MED ORDER — AMOXICILLIN-POT CLAVULANATE 875-125 MG PO TABS: 1.0000 | ORAL_TABLET | Freq: Two times a day (BID) | ORAL | Status: DC

## 2014-05-02 NOTE — Discharge Summary (Signed)
Physician Discharge Summary  SOULEYMANE SAIKI AYT:[id number] DOB: Oct 04, 1923 DOA: 04/29/1977  PCP: Walker Kehr, MD  Admit date: 04/29/2014 Discharge date: 05/02/2014  Time spent: 35 minutes  Recommendations for Outpatient Follow-up:  1. Follow up with PCP in 1-2 weeks  2. Continue Augmentin for 5 additional days 3. Always check blood pressure while standing  Discharge Diagnoses:  Active Problems:   AF (paroxysmal atrial fibrillation)   CKD (chronic kidney disease) stage 4, GFR 15-29 ml/min   COPD (chronic obstructive pulmonary disease)   PMR (polymyalgia rheumatica)   Lethargy   Failure to thrive   Orthostatic hypotension   Sinusitis   Acute encephalopathy  Discharge Condition: stable  Diet recommendation: heart healthy  Filed Weights   04/29/14 1434  Weight: 93.033 kg (205 lb 1.6 oz)    History of present illness:  [name] is a 80 y.o. male with PMh of COPD, CKD, PMR, DM, TIA, CAD was just discharged home from [facility] last week after rehabilitation following admission for respiratory failure, pneumonia in the end of March.  He's had hallucinations before in setting of infections only. History if provided by his son at bedside, who reports that he was doing very well when he went home last week, walked inside with a cane, has been having normal conversations with family members until Sunday.Sunday when his son and daughter came home they noticed a significant change notably being lethargic, tired, sleeping most of the day, poor Po intake. Today he was found on the floor by his caregiver, confused, hallucinating and picking at things in the air. No cough, congestion, no Abd pain, N/V/D, no fevers or chills.  In ER, workup unremarkable  Hospital Course:  Lethargy/Encephalopathy - etiology not entirely clear, labs CXR/UA/CT head unremarkable, possible sinusitis as patient endorses sinus pressure. Started Augmentin on 5/7. ABG with mild hypoxia but without  hypercarbia. MRI brain negative, TSH normal, B12 normal (will start B12 supplements because on the low side)  - improved with antibiotic initiation and very close to baseline now Sinusitis - started on Augmentin 5/7, improved sinus pressure, mental status better.  CKD - stable at baseline, improved with IVF  COPD - stable, no wheezing no cough, nebs PRN  H/o PMR - hold methotrexate  Hypothyroidism - continue synthroid  H/o CAD, TIA - continue plavix  Orthostatic hypotension - this is not new for the patient. He is on Midodrine and needs that to protect against syncope. During previous hospitalizations nephrology recommended to check BP while standing upright as his supine pressures remain elevated but he is in fact orthostatic. People on midodrine for this condition are at risk of night-time/recumbent HTN, which is why the last dose of the day should be no later than 5 pm. Blood pressure checked as below, lying 355/73 systolic however 220/25 while standing.   Procedures:  None    Consultations:  None   Discharge Exam: Filed Vitals:   05/01/14 1254 05/01/14 2302 05/02/14 0624 05/02/14 0916  BP: 159/85 186/91 195/94 109/71  Pulse: 78 72 71   Temp: 98.3 F (36.8 C) 98.2 F (36.8 C) 98.1 F (36.7 C)   TempSrc: Oral Oral Axillary   Resp: 17 18 18    Height:      Weight:      SpO2: 97% 96% 97%    General: NAD Cardiovascular: RRR Respiratory: CTA biL  Discharge Instructions   Future Appointments Provider Department Dept Phone   05/04/2014 2:45 PM Cassandria Anger, MD [place]  [place]       Medication List         albuterol 108 (90 BASE) MCG/ACT inhaler  Commonly known as:  PROVENTIL HFA;VENTOLIN HFA  Inhale 2 puffs into the lungs every 6 (six) hours as needed for wheezing or shortness of breath.     amoxicillin-clavulanate 875-125 MG per tablet  Commonly known as:  AUGMENTIN  Take 1 tablet by mouth every 12 (twelve) hours.      cholecalciferol 1000 UNITS tablet  Commonly known as:  VITAMIN D  Take 1,000 Units by mouth at bedtime.     clopidogrel 75 MG tablet  Commonly known as:  PLAVIX  Take 75 mg by mouth every evening.     folic acid 1 MG tablet  Commonly known as:  FOLVITE  Take 1 mg by mouth every morning.     GERITOL COMPLETE PO  Take 1 tablet by mouth every morning.     levothyroxine 137 MCG tablet  Commonly known as:  SYNTHROID, LEVOTHROID  Take 137 mcg by mouth daily before breakfast.     methotrexate 2.5 MG tablet  Commonly known as:  RHEUMATREX  Take 10 mg by mouth once a week. Takes 4 tablets weekly on Wednesday.   Caution:Chemotherapy. Protect from light.     midodrine 2.5 MG tablet  Commonly known as:  PROAMATINE  Take 1 tablet (2.5 mg total) by mouth 3 (three) times daily with meals.     polyethylene glycol packet  Commonly known as:  MIRALAX / GLYCOLAX  Take 17 g by mouth daily.     sertraline 100 MG tablet  Commonly known as:  ZOLOFT  Take 100 mg by mouth at bedtime.     traMADol 50 MG tablet  Commonly known as:  ULTRAM  Take one tablet by mouth twice daily for pain     vitamin C 500 MG tablet  Commonly known as:  ASCORBIC ACID  Take 500 mg by mouth at bedtime.           Follow-up Information   Follow up with Walker Kehr, MD. Schedule an appointment as soon as possible for a visit in 1 week.   Specialty:  Internal Medicine   Contact information:   [address] [place] [address] [phone]       The results of significant diagnostics from this hospitalization (including imaging, microbiology, ancillary and laboratory) are listed below for reference.    Significant Diagnostic Studies: Dg Chest 2 View  04/29/2014   CLINICAL DATA:  Fall, weakness and shortness of breath.  EXAM: CHEST - 2 VIEW  COMPARISON:  03/16/2014  FINDINGS: Stable mild cardiomegaly and tortuosity of the thoracic aorta. Bibasilar atelectasis present without  overt edema or airspace consolidation. No pleural fluid or pneumothorax is identified. The visualized skeletal structures are unremarkable.  IMPRESSION: Bibasilar atelectasis.   Electronically Signed   By: Aletta Edouard M.D.   On: 04/29/2014 09:40   Ct Head Wo Contrast  04/29/2014   CLINICAL DATA:  Status post fall.  EXAM: CT HEAD WITHOUT CONTRAST  CT CERVICAL SPINE WITHOUT CONTRAST  TECHNIQUE: Multidetector CT imaging of the head and cervical spine was performed following the standard protocol without intravenous contrast. Multiplanar CT image reconstructions of the cervical spine were also generated.  COMPARISON:  Head and cervical spine CT scan 03/12/2014.  FINDINGS: CT HEAD FINDINGS  Atrophy and chronic microvascular ischemic change are again seen. There is  no evidence of acute intracranial abnormality including hemorrhage, infarct, mass lesion, mass effect, midline shift or abnormal extra-axial fluid collection. No hydrocephalus or pneumocephalus. The calvarium is intact.  CT CERVICAL SPINE FINDINGS  No fracture or malalignment of the cervical spine is identified. Multilevel spondylosis is again seen. Imaged lung parenchyma is clear.  IMPRESSION: No acute finding head or cervical spine. Stable compared to prior exam.   Electronically Signed   By: Inge Rise M.D.   On: 04/29/2014 09:29   Ct Cervical Spine Wo Contrast  04/29/2014   CLINICAL DATA:  Status post fall.  EXAM: CT HEAD WITHOUT CONTRAST  CT CERVICAL SPINE WITHOUT CONTRAST  TECHNIQUE: Multidetector CT imaging of the head and cervical spine was performed following the standard protocol without intravenous contrast. Multiplanar CT image reconstructions of the cervical spine were also generated.  COMPARISON:  Head and cervical spine CT scan 03/12/2014.  FINDINGS: CT HEAD FINDINGS  Atrophy and chronic microvascular ischemic change are again seen. There is no evidence of acute intracranial abnormality including hemorrhage, infarct, mass lesion,  mass effect, midline shift or abnormal extra-axial fluid collection. No hydrocephalus or pneumocephalus. The calvarium is intact.  CT CERVICAL SPINE FINDINGS  No fracture or malalignment of the cervical spine is identified. Multilevel spondylosis is again seen. Imaged lung parenchyma is clear.  IMPRESSION: No acute finding head or cervical spine. Stable compared to prior exam.   Electronically Signed   By: Inge Rise M.D.   On: 04/29/2014 09:29   Mr Brain Wo Contrast  04/29/2014   CLINICAL DATA:  Lethargy.  Weakness.  Confusion.  Recent fall.  EXAM: MRI HEAD WITHOUT CONTRAST  TECHNIQUE: Multiplanar, multiecho pulse sequences of the brain and surrounding structures were obtained without intravenous contrast.  COMPARISON:  Head CT same day.  MRI 12/29/2010.  FINDINGS: Diffusion imaging does not show any acute or subacute infarction. There is advanced generalized brain atrophy. There are a few old small vessel insults within the pons. No focal cerebellar infarction. The cerebral hemispheres show mild chronic small-vessel change of the deep and subcortical white matter no large vessel territory infarction. No mass lesion, hemorrhage, hydrocephalus or extra-axial collection. No pituitary mass. No inflammatory sinus disease.  IMPRESSION: No acute finding. Advanced generalized brain atrophy. Mild chronic small-vessel changes.   Electronically Signed   By: Nelson Chimes M.D.   On: 04/29/2014 16:54   Labs: Basic Metabolic Panel:  Recent Labs Lab 04/29/14 0834 04/30/14 0415 05/01/14 0426  NA 138 139 139  K 4.2 5.2 4.2  CL 101 103 103  CO2 26 24 21   GLUCOSE 99 96 118*  BUN 16 15 11   CREATININE 1.62* 1.32 1.25  CALCIUM 8.9 8.2* 8.8   Liver Function Tests:  Recent Labs Lab 04/29/14 0834  AST 31  ALT 22  ALKPHOS 68  BILITOT 0.5  PROT 6.4  ALBUMIN 3.4*   CBC:  Recent Labs Lab 04/29/14 0834 04/30/14 0415 05/01/14 0426  WBC 8.0 8.0 9.0  NEUTROABS 5.0  --   --   HGB 13.1 12.2* 13.2  HCT  40.2 38.1* 40.6  MCV 103.9* 103.8* 103.3*  PLT 283 270 322   Cardiac Enzymes:  Recent Labs Lab 04/29/14 0834 04/30/14 0930  TROPONINI <0.30 <0.30   BNP: BNP (last 3 results)  Recent Labs  03/03/14 1647  PROBNP 1635.0*    Signed:  Dywane Peruski M Nevin Grizzle  [facility] 05/02/2014, 9:33 AM

## 2014-05-02 NOTE — Progress Notes (Signed)
Per MD, Pt ready for d/c.  Notified RN, Pt, family and facility.  Family to provide transportation.  Offered to fax d/c summary.  Per Kenney Houseman at [facility], ok to send d/c summary with Pt.  Facility ready to receive Pt.  Bernita Raisin, [place] [employer] [phone]

## 2014-05-02 NOTE — Progress Notes (Signed)
Pt discharged to [facility]. Left unit in wheelchair pushed by nurse tech accompanied by son and male family member. Left in good condition. Tried calling report to nurse to [facility]; but was told by receptionist to fax report instead. Report faxed to [phone] ( number given by receptionist). Pt's son is transporting pt to [facility]. Discharge packet given to son. Vwilliams,rn.

## 2014-05-04 ENCOUNTER — Ambulatory Visit: Payer: Medicare Other | Admitting: Internal Medicine

## 2014-05-04 ENCOUNTER — Non-Acute Institutional Stay (SKILLED_NURSING_FACILITY): Payer: Medicare Other | Admitting: Internal Medicine

## 2014-05-04 DIAGNOSIS — N184 Chronic kidney disease, stage 4 (severe): Secondary | ICD-10-CM

## 2014-05-04 DIAGNOSIS — E039 Hypothyroidism, unspecified: Secondary | ICD-10-CM

## 2014-05-04 DIAGNOSIS — I4891 Unspecified atrial fibrillation: Secondary | ICD-10-CM

## 2014-05-04 DIAGNOSIS — J449 Chronic obstructive pulmonary disease, unspecified: Secondary | ICD-10-CM

## 2014-05-04 DIAGNOSIS — I480 Paroxysmal atrial fibrillation: Secondary | ICD-10-CM

## 2014-05-04 NOTE — Progress Notes (Signed)
HISTORY & PHYSICAL  DATE: 05/04/2014   FACILITY: [facility]  LEVEL OF CARE: SNF (31)  ALLERGIES:  Allergies  Allergen Reactions  . Bactrim [Sulfamethoxazole-Trimethoprim] Other (See Comments)    hallucinations  . Horse-Derived Products Hives    CHIEF COMPLAINT:  Manage atrial fibrillation, COPD and hypothyroidism  HISTORY OF PRESENT ILLNESS: 80 year old Caucasian male was hospitalized secondary to acute encephalopathy. After hospitalization is admitted to this facility for short-term rehabilitation.  ATRIAL FIBRILLATION: the patients atrial fibrillation remains stable.  The patient denies DOE, tachycardia, orthopnea, transient neurological sx, pedal edema, palpitations, & PNDs.  No complications noted from the medications currently being used.  COPD: the COPD remains stable.  Pt denies sob, cough, wheezing or declining exercise tolerance.  No complications from the medications presently being used.  HYPOTHYROIDISM: The hypothyroidism remains stable. No complications noted from the medications presently being used.  The patient denies fatigue or constipation.  Last TSH normal.  PAST MEDICAL HISTORY :  Past Medical History  Diagnosis Date  . CAD (coronary artery disease)     s/p NSTEMI and BMS stent diagonal07/09;  Lexiscan Myoview 9/13:  EF 62%, no ischemia  . AS (aortic stenosis)     a.  Echo 2009 mean gradient 3mm HG. AVA 2.18;  b.  Echo 4/11 mild AS mean gradient 59mm HG;  c.  Echo 04/2011: Mild LVH, EF 56-38%, grade 1 diastolic dysfunction, mild aortic stenosis, mean gradient 14, mild LAE;  d. Echo 9/13: mod LVH, EF 50-55%, mild to mod AS, mean gradient 17 mmHg  . AF (paroxysmal atrial fibrillation)     Holter monitor 2.5 sec pauses  . Fatigue     CPX 11/09 VO2  14.4 (75% predicte)d, slope 35.  O2 pulse normal.  VO2 corrected for body weight 17.6.  Marland Kitchen Hypothyroidism   . Hyperlipidemia   . History of prostate cancer   . Allergic rhinitis   .  DM (diabetes mellitus)     type II diet controlled  . Shingles   . Left carotid stenosis   . Vitamin B 12 deficiency   . Anemia   . Osteoarthritis   . Nephrolithiasis   . Obesity   . OSA (obstructive sleep apnea)     AHI 15 PSG 09/06/08  . Hx of colonoscopy   . Stroke   . Anxiety and depression   . PMR (polymyalgia rheumatica) 05/29/2012  . Hypogonadism male 05/29/2012  . Hyperparathyroidism 05/29/2012  . TIA (transient ischemic attack) 05/29/2012  . CKD (chronic kidney disease) stage 4, GFR 15-29 ml/min 05/29/2012  . COPD (chronic obstructive pulmonary disease) 05/29/2012  . Myocardial infarction   . Cancer     prostate ca history    PAST SURGICAL HISTORY: Past Surgical History  Procedure Laterality Date  . Appendectomy    . Hernia repair    . Right carpal tunnel release    . Lumbar spine surgery    . Cystectomy      neck  . Prostatectomy    . Vasectomy    . Total knee arthroplasty      right  . Cholecystectomy      SOCIAL HISTORY:  reports that he quit smoking about 55 years ago. His smoking use included Cigarettes. He has a 12.5 pack-year smoking history. He quit smokeless tobacco use about 43 years ago. His smokeless tobacco use included Snuff and Chew. He reports that he does not drink alcohol or use illicit drugs.  FAMILY HISTORY:  Family History  Problem Relation Age of Onset  . Stroke Mother 3  . Coronary artery disease      father, brother, sister  . Alcohol abuse Father 14    CURRENT MEDICATIONS: Reviewed per MAR/see medication list  REVIEW OF SYSTEMS:  See HPI otherwise 14 point ROS is negative.  PHYSICAL EXAMINATION  VS:  See VS section  GENERAL: no acute distress, normal body habitus EYES: conjunctivae normal, sclerae normal, normal eye lids MOUTH/THROAT: lips without lesions,no lesions in the mouth,tongue is without lesions,uvula elevates in midline NECK: supple, trachea midline, no neck masses, no thyroid tenderness, no thyromegaly LYMPHATICS: no  LAN in the neck, no supraclavicular LAN RESPIRATORY: breathing is even & unlabored, BS CTAB CARDIAC: Heart rate irregularly irregular, no murmur,no extra heart sounds, +1 edema in the left upper extremity GI:  ABDOMEN: abdomen soft, normal BS, no masses, no tenderness  LIVER/SPLEEN: no hepatomegaly, no splenomegaly MUSCULOSKELETAL: HEAD: normal to inspection & palpation BACK: no kyphosis, scoliosis or spinal processes tenderness EXTREMITIES: LEFT UPPER EXTREMITY: full range of motion, normal strength & tone RIGHT UPPER EXTREMITY:  full range of motion, normal strength & tone LEFT LOWER EXTREMITY:  Minimal range of motion, normal strength & tone RIGHT LOWER EXTREMITY: minimal range of motion, normal strength & tone PSYCHIATRIC: the patient is alert & oriented to person, affect & behavior appropriate  LABS/RADIOLOGY:  Labs reviewed: Basic Metabolic Panel:  Recent Labs  03/12/14 1511  04/29/14 0834 04/30/14 0415 05/01/14 0426  NA 142  < > 138 139 139  K 4.3  < > 4.2 5.2 4.2  CL 102  < > 101 103 103  CO2 28  < > 26 24 21   GLUCOSE 136*  < > 99 96 118*  BUN 26*  < > 16 15 11   CREATININE 1.50*  < > 1.62* 1.32 1.25  CALCIUM 8.5  < > 8.9 8.2* 8.8  MG 2.2  --   --   --   --   PHOS 3.6  --   --   --   --   < > = values in this interval not displayed. Liver Function Tests:  Recent Labs  03/13/14 0448 04/03/14 1457 04/29/14 0834  AST 23 26 31   ALT 14 25 22   ALKPHOS 55 69 68  BILITOT 0.3 0.6 0.5  PROT 5.7* 6.4 6.4  ALBUMIN 3.1* 3.7 3.4*   CBC:  Recent Labs  03/12/14 1511  03/17/14 0443 04/29/14 0834 04/30/14 0415 05/01/14 0426  WBC 8.2  < > 15.6* 8.0 8.0 9.0  NEUTROABS 6.7  --  12.1* 5.0  --   --   HGB 11.6*  < > 12.0* 13.1 12.2* 13.2  HCT 34.9*  < > 35.6* 40.2 38.1* 40.6  MCV 104.5*  < > 102.6* 103.9* 103.8* 103.3*  PLT 312  < > 325 283 270 322  < > = values in this interval not displayed.  Cardiac Enzymes:  Recent Labs  03/04/14 1112 04/03/14 1510  04/29/14 0834 04/30/14 0930  CKTOTAL  --  39  --   --   TROPONINI <0.30  --  <0.30 <0.30   CBG:  Recent Labs  03/15/14 2222 03/16/14 0734 03/17/14 0750  GLUCAP 136* 108* 101*    CT CERVICAL SPINE WITHOUT CONTRAST   TECHNIQUE: Multidetector CT imaging of the head and cervical spine was performed following the standard protocol without intravenous contrast. Multiplanar CT image reconstructions of the cervical spine were also generated.  COMPARISON:  Head and cervical spine CT scan 03/12/2014.   FINDINGS: CT HEAD FINDINGS   Atrophy and chronic microvascular ischemic change are again seen. There is no evidence of acute intracranial abnormality including hemorrhage, infarct, mass lesion, mass effect, midline shift or abnormal extra-axial fluid collection. No hydrocephalus or pneumocephalus. The calvarium is intact.   CT CERVICAL SPINE FINDINGS   No fracture or malalignment of the cervical spine is identified. Multilevel spondylosis is again seen. Imaged lung parenchyma is clear.   IMPRESSION: No acute finding head or cervical spine. Stable compared to prior exam. CHEST - 2 VIEW   COMPARISON:  03/16/2014   FINDINGS: Stable mild cardiomegaly and tortuosity of the thoracic aorta. Bibasilar atelectasis present without overt edema or airspace consolidation. No pleural fluid or pneumothorax is identified. The visualized skeletal structures are unremarkable.   IMPRESSION: Bibasilar atelectasis. MRI HEAD WITHOUT CONTRAST   TECHNIQUE: Multiplanar, multiecho pulse sequences of the brain and surrounding structures were obtained without intravenous contrast.   COMPARISON:  Head CT same day.  MRI 12/29/2010.   FINDINGS: Diffusion imaging does not show any acute or subacute infarction. There is advanced generalized brain atrophy. There are a few old small vessel insults within the pons. No focal cerebellar infarction. The cerebral hemispheres show mild chronic  small-vessel change of the deep and subcortical white matter no large vessel territory infarction. No mass lesion, hemorrhage, hydrocephalus or extra-axial collection. No pituitary mass. No inflammatory sinus disease.   IMPRESSION: No acute finding. Advanced generalized brain atrophy. Mild chronic small-vessel changes.    ASSESSMENT/PLAN:  Atrial fibrillation-rate controlled COPD-well compensated Hypothyroidism-well controlled Chronic kidney disease stage IV-stable CAD-stable Macrocytosis-check RBC folate and vitamin B12 level Acute sinusitis-on augmentin PMR-on methotrexate  I have reviewed patient's medical records received at admission/from hospitalization.  CPT CODE: 67591  Gayani Y Dasanayaka, [place] [phone]

## 2014-05-06 ENCOUNTER — Non-Acute Institutional Stay (SKILLED_NURSING_FACILITY): Payer: Medicare Other | Admitting: Internal Medicine

## 2014-05-06 DIAGNOSIS — H109 Unspecified conjunctivitis: Secondary | ICD-10-CM

## 2014-05-07 DIAGNOSIS — H109 Unspecified conjunctivitis: Secondary | ICD-10-CM | POA: Insufficient documentation

## 2014-05-07 NOTE — Progress Notes (Signed)
Patient ID: [name], male   DOB: 1923/11/18, 80 y.o.   MRN: [id number]            PROGRESS NOTE  DATE: 05/06/2014       FACILITY:  [facility]  LEVEL OF CARE: SNF (31)  Acute Visit  CHIEF COMPLAINT:  Manage bilateral eye drainage.    HISTORY OF PRESENT ILLNESS: I was requested by the staff to assess the patient regarding above problem(s):  Patient reports that he is having drainage from both eyes.  Denies blurry vision or visual disturbances.  Symptoms have been present for a couple of days.   He cannot identify precipitating or alleviating factors.  There is no temporal relationship.    PAST MEDICAL HISTORY : Reviewed.  No changes/see problem list  CURRENT MEDICATIONS: Reviewed per MAR/see medication list  REVIEW OF SYSTEMS:  GENERAL: no change in appetite, no fatigue, no weight changes, no fever, chills or weakness RESPIRATORY: no cough, SOB, DOE,, wheezing, hemoptysis CARDIAC: no chest pain, edema or palpitations GI: no abdominal pain, diarrhea, constipation, heart burn, nausea or vomiting  PHYSICAL EXAMINATION  GENERAL: no acute distress, moderately obese body habitus EYES: bilateral eyes have yellow drainage, but no conjunctival erythema    NECK: supple, trachea midline, no neck masses, no thyroid tenderness, no thyromegaly LYMPHATICS: no LAN in the neck, no supraclavicular LAN RESPIRATORY: breathing is even & unlabored, BS CTAB  ASSESSMENT/PLAN:  Bilateral conjunctivitis.  New problem.  Start gentamicin ophthalmic 0.3%, 2 drops to each eye b.i.d. for one week.    CPT CODE: 22449      Gayani Y Dasanayaka, [name] [phone]

## 2014-05-11 ENCOUNTER — Non-Acute Institutional Stay (SKILLED_NURSING_FACILITY): Payer: Medicare Other | Admitting: Internal Medicine

## 2014-05-11 DIAGNOSIS — N184 Chronic kidney disease, stage 4 (severe): Secondary | ICD-10-CM

## 2014-05-11 DIAGNOSIS — D7589 Other specified diseases of blood and blood-forming organs: Secondary | ICD-10-CM

## 2014-05-12 NOTE — Progress Notes (Signed)
Patient ID: [name], male   DOB: 1923-04-05, 80 y.o.   MRN: [id number]            PROGRESS NOTE  DATE: 05/11/2014    FACILITY:  [facility]  LEVEL OF CARE: SNF (31)  Acute Visit  CHIEF COMPLAINT:  Manage chronic kidney disease stage IV.    HISTORY OF PRESENT ILLNESS: I was requested by the staff to assess the patient regarding above problem(s):  CHRONIC KIDNEY DISEASE: The patient's chronic kidney disease remains stable.  Patient denies increasing lower extremity swelling or confusion. Last BUN and creatinine are:   On 05/06/2014:  BUN 19, creatinine 1.39.   In 02/2014:  BUN 34, creatinine 1.61.    PAST MEDICAL HISTORY : Reviewed.  No changes/see problem list  CURRENT MEDICATIONS: Reviewed per MAR/see medication list  PHYSICAL EXAMINATION  VS:  T 97      P 71      RR 18      BP 109/71     WT (Lb) 205           GENERAL: no acute distress, moderately obese body habitus RESPIRATORY: breathing is even & unlabored, BS CTAB CARDIAC: RRR, no murmur,no extra heart sounds, no edema  LABS/RADIOLOGY:   On 05/06/2014:  MCV 103.    Vitamin B12 level 241.    Folic acid level 24.5.    ASSESSMENT/PLAN:   Chronic kidney disease stage IV.  Renal functions improved.    Macrocytosis.  RBC folate and B12 level normal.     CPT CODE: 80998       Gayani Y Dasanayaka, MD [facility] [phone]

## 2014-05-13 DIAGNOSIS — D7589 Other specified diseases of blood and blood-forming organs: Secondary | ICD-10-CM | POA: Insufficient documentation

## 2014-05-15 ENCOUNTER — Ambulatory Visit: Payer: Medicare Other | Admitting: Internal Medicine

## 2014-05-15 ENCOUNTER — Non-Acute Institutional Stay (SKILLED_NURSING_FACILITY): Payer: Medicare Other | Admitting: Internal Medicine

## 2014-05-15 DIAGNOSIS — F19921 Other psychoactive substance use, unspecified with intoxication with delirium: Secondary | ICD-10-CM

## 2014-05-15 DIAGNOSIS — T50904A Poisoning by unspecified drugs, medicaments and biological substances, undetermined, initial encounter: Secondary | ICD-10-CM

## 2014-05-15 DIAGNOSIS — R079 Chest pain, unspecified: Secondary | ICD-10-CM

## 2014-05-15 DIAGNOSIS — T50905A Adverse effect of unspecified drugs, medicaments and biological substances, initial encounter: Secondary | ICD-10-CM

## 2014-05-20 ENCOUNTER — Non-Acute Institutional Stay (SKILLED_NURSING_FACILITY): Payer: Medicare Other | Admitting: Internal Medicine

## 2014-05-20 DIAGNOSIS — D7589 Other specified diseases of blood and blood-forming organs: Secondary | ICD-10-CM

## 2014-05-20 DIAGNOSIS — N184 Chronic kidney disease, stage 4 (severe): Secondary | ICD-10-CM

## 2014-05-20 NOTE — Progress Notes (Addendum)
Patient ID: [name], male   DOB: Dec 28, 1922, 80 y.o.   MRN: [id number]                  PROGRESS NOTE  DATE:  05/15/2014     FACILITY: [facility]    LEVEL OF CARE:   SNF   Acute Visit   HISTORY OF PRESENT ILLNESS:  This is an 80 year-old man whom I actually only saw recently on 04/21/2014 to discharge from the facility.  At that point, he had come to [place] after an admission to hospital in March with pneumonia and  respiratory failure.    He also has chronic renal failure, listed as stage IV; COPD, on chronic steroids.  He is also noted to have polymyalgia rheumatica.    When I discharged him, he had an extremely unstable gait.   Apparently, he did well initially at home.  However, the next week he started becoming more lethargic, tired, and then had a series of falls.  He was readmitted to hospital.    The exact etiology of his encephalopathy was felt to be unclear.  His CT scan of the head showed microvascular ischemia, possible sinusitis.  MRI of the brain was negative.   TSH and B12 were normal.  However, he was started on B12 because it was on the low end of normal.  His Risperdal was put on hold until his mentation improves.    LABORATORY DATA:   In the hospital, his BUN was 11 and creatinine 1.25 on 05/01/2014.    White count 9 and hemoglobin 13.2.    CURRENT MEDICATIONS:  Medication list is reviewed.     Plavix 75 q.d.    Folic acid 1 mg q.d.    Synthroid 137 q.d.    Methotrexate 10 mg once a week.    Midodrine 2.5, 1 tablet  t.i.d.    Zoloft 100 q.h.s.    Tramadol 50 b.i.d.    Augmentin 875 b.i.d., which should be completed.    REVIEW OF SYSTEMS:  Not really possible, but  apparently earlier this afternoon he complained of chest pain to the nurse.  His vital signs were stable.   He did not look to be in any distress.    I have reviewed his chart.  He does have a history of coronary artery disease with a history of a non-ST elevation MI and a bare metal  stent in July 2009.  He had a Myoview study in September 2013 that showed an ejection fraction of 62% and no ischemia.  He also has known  aortic stenosis with an aortic valve area of 2.18, also in 2009.  This was listed as mild at the time.    PHYSICAL EXAMINATION:   GENERAL APPEARANCE:  When I came into the room, the patient is again lethargic.  He will wake up with vigorous stimuli, but he simply seems to fade off.  Nevertheless, he does not look to be medically ill.   CHEST/RESPIRATORY:  Shallow air entry bilaterally.   However, his O2 sat is 91% on room air.   CARDIOVASCULAR:  CARDIAC:  Heart sounds are distant.  There are no murmurs.  No gallops.   GASTROINTESTINAL:  ABDOMEN:   Soft.   LIVER/SPLEEN/KIDNEYS:  No liver, no spleen.  No tenderness.   CIRCULATION:   EDEMA/VARICOSITIES:   Extremities:  No evidence of a DVT.    ASSESSMENT/PLAN:  Chest pain in a gentleman with known coronary artery disease.  Although he is not complaining of pain currently, I will order an EKG and give him nitroglycerin p.r.n. for pain.    Lethargy.  This seemed to be the major reason resulting in this hospitalization.  I note that he was recently started back on Risperdal on 05/12/2014,  apparently due to hallucinations mostly at night, noticed by the family.  He is so lethargic now that I am going to reduce this to 0.25 mg.  I will repeat his lab work next week.    He is no longer on prednisone, which he was on when I discharged him three weeks ago.  The reason he is not on this is unclear.   He was, I believe, on 8 mg daily.  He is not hypotensive, blood pressure today at 120/70.    Addendum: EKG was not revealing

## 2014-05-25 NOTE — Progress Notes (Signed)
Patient ID: [name], male   DOB: 1923-05-10, 80 y.o.   MRN: [id number]            PROGRESS NOTE  DATE: 05/20/2014      FACILITY:  [facility]  LEVEL OF CARE: SNF (31)  Acute Visit  CHIEF COMPLAINT:  Manage chronic kidney disease stage IV.    HISTORY OF PRESENT ILLNESS: I was requested by the staff to assess the patient regarding above problem(s):  CHRONIC KIDNEY DISEASE: The patient's chronic kidney disease remains stable.  Patient denies increasing lower extremity swelling or confusion. Last BUN and creatinine are:   On 05/19/2014:  BUN 26, creatinine 1.57.  In 02/2014:  Creatinine 1.61.  Patient is a poor historian.    PAST MEDICAL HISTORY : Reviewed.  No changes/see problem list  CURRENT MEDICATIONS: Reviewed per MAR/see medication list  PHYSICAL EXAMINATION  VS:  T 98.1       P 71      RR 18      BP 109/71    POX 97%       WT (Lb) 205      GENERAL: no acute distress, moderately obese body habitus RESPIRATORY: breathing is even & unlabored, BS CTAB CARDIAC: RRR, no murmur,no extra heart sounds, +1 bilateral lower extremity edema      LABS/RADIOLOGY: On 05/19/2014:  MCV 102.    In March 2015:  MCV 103.    ASSESSMENT/PLAN:  Chronic kidney disease stage IV.  Renal functions improved.    Macrocytosis.  Check RBC folate and vitamin B12 level.    CPT CODE: 40768          Hassani Sliney Y Wasyl Dornfeld, [place] [phone]

## 2014-06-01 ENCOUNTER — Non-Acute Institutional Stay (SKILLED_NURSING_FACILITY): Payer: Medicare Other | Admitting: Internal Medicine

## 2014-06-01 ENCOUNTER — Other Ambulatory Visit: Payer: Self-pay | Admitting: *Deleted

## 2014-06-01 DIAGNOSIS — H109 Unspecified conjunctivitis: Secondary | ICD-10-CM

## 2014-06-01 MED ORDER — TRAMADOL HCL 50 MG PO TABS: ORAL_TABLET | ORAL | Status: DC

## 2014-06-01 NOTE — Telephone Encounter (Signed)
[facility] 

## 2014-06-03 ENCOUNTER — Non-Acute Institutional Stay (SKILLED_NURSING_FACILITY): Payer: Medicare Other | Admitting: Internal Medicine

## 2014-06-03 DIAGNOSIS — I480 Paroxysmal atrial fibrillation: Secondary | ICD-10-CM

## 2014-06-03 DIAGNOSIS — I4891 Unspecified atrial fibrillation: Secondary | ICD-10-CM

## 2014-06-03 DIAGNOSIS — I2510 Atherosclerotic heart disease of native coronary artery without angina pectoris: Secondary | ICD-10-CM

## 2014-06-03 DIAGNOSIS — J449 Chronic obstructive pulmonary disease, unspecified: Secondary | ICD-10-CM

## 2014-06-03 DIAGNOSIS — E039 Hypothyroidism, unspecified: Secondary | ICD-10-CM

## 2014-06-03 NOTE — Progress Notes (Signed)
         PROGRESS NOTE  DATE: 06/03/2014  FACILITY: Nursing Home Location: [facility]  LEVEL OF CARE: SNF (31)  Routine Visit  CHIEF COMPLAINT:  Manage COPD, atrial fibrillation and hypothyroidism  HISTORY OF PRESENT ILLNESS:  REASSESSMENT OF ONGOING PROBLEM(S):  COPD: the COPD remains stable.  Pt denies sob, cough, wheezing or declining exercise tolerance.  No complications from the medications presently being used.  ATRIAL FIBRILLATION: the patients atrial fibrillation remains stable.  The patient denies DOE, tachycardia, orthopnea, transient neurological sx, pedal edema, palpitations, & PNDs.  No complications noted from the medications currently being used.  HYPOTHYROIDISM: The hypothyroidism remains stable. No complications noted from the medications presently being used.  The patient denies fatigue or constipation.  Last TSH not available.  PAST MEDICAL HISTORY : Reviewed.  No changes/see problem list  CURRENT MEDICATIONS: Reviewed per MAR/see medication list  REVIEW OF SYSTEMS:  GENERAL: no change in appetite, no fatigue, no weight changes, no fever, chills or weakness RESPIRATORY: no cough, SOB, DOE, wheezing, hemoptysis CARDIAC: no chest pain, edema or palpitations GI: no abdominal pain, diarrhea, constipation, heart burn, nausea or vomiting  PHYSICAL EXAMINATION  VS:  See VS section  GENERAL: no acute distress, moderately obese body habitus EYES: conjunctivae normal, sclerae normal, normal eye lids NECK: supple, trachea midline, no neck masses, no thyroid tenderness, no thyromegaly LYMPHATICS: no LAN in the neck, no supraclavicular LAN RESPIRATORY: breathing is even & unlabored, BS CTAB CARDIAC: Heart rate is irregularly irregular, no murmur,no extra heart sounds, +1 bilateral lower extremity edema GI: abdomen soft, normal BS, no masses, no tenderness, no hepatomegaly, no splenomegaly PSYCHIATRIC: the patient is alert & oriented to  person, affect & behavior appropriate  LABS/RADIOLOGY: 5-15 MCV 103 otherwise CBC normal , creatinine 1.39, glucose 107 otherwise BMP normal, vitamin B12 level 241, folate level 17.7  3-15 liver profile normal  ASSESSMENT/PLAN:  COPD-compensated Atrial fibrillation-rate controlled Hypothyroidism-check TSH. Continue levothyroxine. CAD-stable Diabetes mellitus-diet controlled. Check hemoglobin A1c. Aortic stenosis-asymptomatic Chronic kidney disease stage IV-renal functions improved Left eye conjunctivitis-sx much improved on gentamicin eye drops Check Depakote level  CPT CODE: 16109  Jonathandavid Marlett Y Lannie Heaps, MD [place] [phone]

## 2014-06-04 NOTE — Progress Notes (Signed)
Patient ID: [name], male   DOB: July 15, 1923, 80 y.o.   MRN: [id number]            PROGRESS NOTE  DATE: 06/01/2014       FACILITY:  [facility]  LEVEL OF CARE: SNF (31)  Acute Visit  CHIEF COMPLAINT:  Manage left eye discharge.    HISTORY OF PRESENT ILLNESS: I was requested by the staff to assess the patient regarding above problem(s):  Patient has been having left eye redness and a drainage for several days.  He denies visual disturbances.  He cannot identify precipitating or alleviating factors.  There is no temporal relationship.    PAST MEDICAL HISTORY : Reviewed.  No changes/see problem list  CURRENT MEDICATIONS: Reviewed per MAR/see medication list  REVIEW OF SYSTEMS:  GENERAL: no change in appetite, no fatigue, no weight changes, no fever, chills or weakness RESPIRATORY: no cough, SOB, DOE,, wheezing, hemoptysis CARDIAC: no chest pain, edema or palpitations GI: no abdominal pain, diarrhea, constipation, heart burn, nausea or vomiting  PHYSICAL EXAMINATION  VS:  T 98       P 60      RR 18      BP 120/70         GENERAL: no acute distress, normal body habitus EYES: left eye has conjunctival erythema and a yellow discharge, right eye is normal      NECK: supple, trachea midline, no neck masses, no thyroid tenderness, no thyromegaly RESPIRATORY: breathing is even & unlabored, BS CTAB CARDIAC: RRR, no murmur,no extra heart sounds, no edema GI: abdomen soft, normal BS, no masses, no tenderness, no hepatomegaly, no splenomegaly  ASSESSMENT/PLAN:  Left eye conjunctivitis.  New problem.  Start gentamicin ophthalmic 0.3%, 2 drops to left eye b.i.d. for one week.    CPT CODE: 11914        Undra Trembath Y Jaiceon Collister, [place] [phone]

## 2014-06-05 ENCOUNTER — Non-Acute Institutional Stay (SKILLED_NURSING_FACILITY): Payer: Medicare Other | Admitting: Internal Medicine

## 2014-06-05 DIAGNOSIS — J441 Chronic obstructive pulmonary disease with (acute) exacerbation: Secondary | ICD-10-CM

## 2014-06-05 NOTE — Progress Notes (Signed)
Patient ID: [name], male   DOB: 1923-05-18, 80 y.o.   MRN: [id number] Facility; [facility] Chief complaint; cough, shortness of breath History the patient with chronic COPD also a history of atrial fibrillation and chronic renal failure. He was last in hospital from 5/6 through 5/9 with altered mental status with lethargy and sleeping most of the time. The etiology of this was not completely clear he had a chest x-ray urine and CT scan of the head were all negative for. He is a chronic renal failure was at baseline. He was found to have sinusitis . He has been noted by the staff to have a congested cough sinuses etc. I been asked to see him because of this  Past Medical History  Diagnosis Date  . CAD (coronary artery disease)     s/p NSTEMI and BMS stent diagonal07/09;  Lexiscan Myoview 9/13:  EF 62%, no ischemia  . AS (aortic stenosis)     a.  Echo 2009 mean gradient 82mm HG. AVA 2.18;  b.  Echo 4/11 mild AS mean gradient 42mm HG;  c.  Echo 04/2011: Mild LVH, EF 62-22%, grade 1 diastolic dysfunction, mild aortic stenosis, mean gradient 14, mild LAE;  d. Echo 9/13: mod LVH, EF 50-55%, mild to mod AS, mean gradient 17 mmHg  . AF (paroxysmal atrial fibrillation)     Holter monitor 2.5 sec pauses  . Fatigue     CPX 11/09 VO2  14.4 (75% predicte)d, slope 35.  O2 pulse normal.  VO2 corrected for body weight 17.6.  Marland Kitchen Hypothyroidism   . Hyperlipidemia   . History of prostate cancer   . Allergic rhinitis   . DM (diabetes mellitus)     type II diet controlled  . Shingles   . Left carotid stenosis   . Vitamin B 12 deficiency   . Anemia   . Osteoarthritis   . Nephrolithiasis   . Obesity   . OSA (obstructive sleep apnea)     AHI 15 PSG 09/06/08  . Hx of colonoscopy   . Stroke   . Anxiety and depression   . PMR (polymyalgia rheumatica) 05/29/2012  . Hypogonadism male 05/29/2012  . Hyperparathyroidism 05/29/2012  . TIA (transient ischemic attack) 05/29/2012  . CKD (chronic kidney disease) stage  4, GFR 15-29 ml/min 05/29/2012  . COPD (chronic obstructive pulmonary disease) 05/29/2012  . Myocardial infarction   . Cancer     prostate ca history   #2  Review of systems Gen. patient is not feeling unwell Respiratory productive cough he cannot describe the sputum. Some shortness of breath no chest pain Cardiac no exertional chest pain GI speech therapy reports some coughing with thin liquids  Physical exam; pulse 64 respirations 18 O2 sat is 94% on room air Gen. patient is sitting up in a wheelchair eating his lunch he is alert and responsive Respiratory decreased air entry bilaterally prolonged expiratory phase. No accessory muscle use work of breathing does not appear to be excessive Cardiac heart sounds are regular pulse is 64  Extremities; minimal edema venous stasis  Current medications Plavix 75 mg daily Folic acid 1 mg daily  Synthroid 137 daily Methotrexate 2.5 mg tabs 10 mg once a week Midrin 2.5 3 times a day MiraLax 17 g a day sertraline; 100 mg at night Tramadol 50 mg by mouth twice a day Vitamin C Vitamin D. 1000 units by mouth each bedtime Risperdal 0.5 each bedtime Depakote 125 by mouth twice a day Risperdal 0.25 by  mouth each bedtime  Impression/plan #1 mild exacerbation of COPD. Given his history I think it would be wise to go ahead and do a chest x-ray he has albuterol inhalers 2 puffs every 6 hours when necessary I'll make sure he uses these routinely. I will give him 5 days worth of Levaquin. #2 he has an impressive cardiac history including A. fib, , coronary disease, aortic stenosis. This is obviously unstable at the moment.

## 2014-06-17 ENCOUNTER — Non-Acute Institutional Stay (SKILLED_NURSING_FACILITY): Payer: Medicare Other | Admitting: Internal Medicine

## 2014-06-17 DIAGNOSIS — R059 Cough, unspecified: Secondary | ICD-10-CM

## 2014-06-17 DIAGNOSIS — R05 Cough: Secondary | ICD-10-CM | POA: Insufficient documentation

## 2014-06-17 NOTE — Progress Notes (Signed)
         PROGRESS NOTE  DATE: 06/17/2014  FACILITY:  [facility]  LEVEL OF CARE: SNF (31)  Acute Visit  CHIEF COMPLAINT:  Manage cough  HISTORY OF PRESENT ILLNESS: I was requested by the staff to assess the patient regarding above problem(s):  COUGH: on going Problem. staff report ongoingcough for 1 month. Cough is productive. Precipitating or alleviating factors cannot be identified. There are no other associated signs and symptoms such as shortness of breath, fever, chills or night sweats. There is no respiratory insufficiency. There is no temporal relationship.  He was treated with levaquin at the beginning of the month.  PAST MEDICAL HISTORY : Reviewed.  No changes/see problem list  CURRENT MEDICATIONS: Reviewed per MAR/see medication list  REVIEW OF SYSTEMS:  GENERAL: no change in appetite, no fatigue, no weight changes, no fever, chills or weakness RESPIRATORY: c/o cough,  Denies SOB, DOE,, wheezing, hemoptysis CARDIAC: no chest pain,  or palpitations, c/o LE swelling GI: no abdominal pain, diarrhea, constipation, heart burn, nausea or vomiting  PHYSICAL EXAMINATION  VS: see VS section  GENERAL: no acute distress, normal body habitus LYMPHATICS: no LAN in the neck, no supraclavicular LAN RESPIRATORY: breathing is even & unlabored, BS CTAB CARDIAC: RRR, no murmur,no extra heart sounds, +2 bilateral LE edema GI: abdomen soft, normal BS, no masses, no tenderness, no hepatomegaly, no splenomegaly PSYCHIATRIC: the patient is alert & oriented to person, affect & behavior appropriate  ASSESSMENT/PLAN:  Cough-unstable ongoing problem.  Obtain CXR.  Continue robitussin x 7 days.  Start claritin D 10mg  qd.  CPT CODE: 04599  [name], [place] [phone]

## 2014-06-19 ENCOUNTER — Telehealth: Payer: Self-pay | Admitting: Internal Medicine

## 2014-06-19 NOTE — Telephone Encounter (Signed)
Informed Scott at [place] of verbal orders for below request.

## 2014-06-19 NOTE — Telephone Encounter (Signed)
Triage VM left by Caryl Pina from [place] to request verbal orders for the patient. She is recommending 1. Order for nursing for his COPD 2. Physical Therapy order for frequent falls 3. Speech therapy order for dysphagia.  Caryl Pina can be reached back at Ph#: [phone] with verbal.

## 2014-06-22 ENCOUNTER — Other Ambulatory Visit: Payer: Self-pay | Admitting: *Deleted

## 2014-06-22 MED ORDER — TRAMADOL HCL 50 MG PO TABS: ORAL_TABLET | ORAL | Status: DC

## 2014-06-22 NOTE — Telephone Encounter (Signed)
[facility] 

## 2014-06-23 ENCOUNTER — Encounter: Payer: Self-pay | Admitting: Internal Medicine

## 2014-06-23 ENCOUNTER — Ambulatory Visit: Payer: Medicare Other | Admitting: Internal Medicine

## 2014-06-23 ENCOUNTER — Non-Acute Institutional Stay (SKILLED_NURSING_FACILITY): Payer: Medicare Other | Admitting: Internal Medicine

## 2014-06-23 DIAGNOSIS — S41111A Laceration without foreign body of right upper arm, initial encounter: Secondary | ICD-10-CM

## 2014-06-23 DIAGNOSIS — S41109A Unspecified open wound of unspecified upper arm, initial encounter: Secondary | ICD-10-CM

## 2014-06-23 DIAGNOSIS — M353 Polymyalgia rheumatica: Secondary | ICD-10-CM

## 2014-06-23 DIAGNOSIS — S41119A Laceration without foreign body of unspecified upper arm, initial encounter: Secondary | ICD-10-CM | POA: Insufficient documentation

## 2014-06-23 DIAGNOSIS — J4489 Other specified chronic obstructive pulmonary disease: Secondary | ICD-10-CM

## 2014-06-23 DIAGNOSIS — S72009A Fracture of unspecified part of neck of unspecified femur, initial encounter for closed fracture: Secondary | ICD-10-CM

## 2014-06-23 DIAGNOSIS — F0390 Unspecified dementia without behavioral disturbance: Secondary | ICD-10-CM

## 2014-06-23 DIAGNOSIS — G4733 Obstructive sleep apnea (adult) (pediatric): Secondary | ICD-10-CM

## 2014-06-23 DIAGNOSIS — J449 Chronic obstructive pulmonary disease, unspecified: Secondary | ICD-10-CM

## 2014-06-23 DIAGNOSIS — I10 Essential (primary) hypertension: Secondary | ICD-10-CM

## 2014-06-23 DIAGNOSIS — K5900 Constipation, unspecified: Secondary | ICD-10-CM

## 2014-06-23 DIAGNOSIS — F3289 Other specified depressive episodes: Secondary | ICD-10-CM

## 2014-06-23 DIAGNOSIS — F329 Major depressive disorder, single episode, unspecified: Secondary | ICD-10-CM

## 2014-06-23 DIAGNOSIS — S72001A Fracture of unspecified part of neck of right femur, initial encounter for closed fracture: Secondary | ICD-10-CM | POA: Insufficient documentation

## 2014-06-23 DIAGNOSIS — N184 Chronic kidney disease, stage 4 (severe): Secondary | ICD-10-CM

## 2014-06-23 DIAGNOSIS — F32A Depression, unspecified: Secondary | ICD-10-CM

## 2014-06-23 NOTE — Assessment & Plan Note (Signed)
Continue daily dressing changes

## 2014-06-23 NOTE — Assessment & Plan Note (Addendum)
Appears to be worsening. There may be some degree of existing delirium from his hospitalization. He is afebrile and in no acute distress. We will monitor closely for now. Would not begin any meds for this a this time. He may benefit from [place] in the future. He is currently on Risperdal at bedtime. If the staff reports continued hallucinations, this may need to be increased. He did not exhibit this in the room today.

## 2014-06-23 NOTE — Assessment & Plan Note (Signed)
Not currently on meds. His BP is elevated today. Monitor BP and if it continues to be elevated would treat given his hx of AS.

## 2014-06-23 NOTE — Assessment & Plan Note (Signed)
He was previously on Zoloft and was changed to Wellbutrin. I do not see any signs of depression. Monitor due to his confusion.

## 2014-06-23 NOTE — Assessment & Plan Note (Addendum)
I was not able to locate any PFTs. He is currently oxygen dependent at 2L. He is ordered MDI's but is not able to follow commands to use them. He wheezing and has a tight cough. Will begin alb/atrovent nebs QID and attempt to wean the oxygen in after these have had a chance to work. We will discontinue the Claritin but continue Mucinex.

## 2014-06-23 NOTE — Progress Notes (Signed)
Patient ID: [name], male   DOB: November 09, 1923, 80 y.o.   MRN: [id number]   [place] (31)  Code Status: DNR  Chief Complaint  Patient presents with  . f/u hospitalization/hip fx    HPI: This is an 80 y.o. Male discharged from the [facility] 06/22/14 s/p right hemiarthroplasty. He recently was discharged from [facility] to [place] [facility] 06/18/14 and fell.  Apparently he fell when getting OOB and sustained a right displaced femoral neck fracture at [facility]. Prior to being discharged to [facility] he was seen at [facility] for a COPD exacerbation and was started on antibiotics.  He is here for rehabilitation. There have been several attempts to discharge him that have obviously been unsuccessful. He has a hx of falls and encephalopathy. The nurse reports today that he is more confused than normal. He is eating well, afebrile, and following commands. He denies pain at this time.    Allergies  Allergen Reactions  . Bactrim [Sulfamethoxazole-Trimethoprim] Other (See Comments)    hallucinations  . Horse-Derived Products Hives    MEDICATIONS -  reviewed  DATA REVIEWED  Radiologic Exams:   04/29/14:MRI of the brain MPRESSION:  No acute finding. Advanced generalized brain atrophy. Mild chronic  small-vessel changes. CXR 04/29/14: Bibasilar atx  Laboratory Studies: Lab Results  Component Value Date   WBC 9.0 05/01/2014   HGB 13.2 05/01/2014   HCT 40.6 05/01/2014   MCV 103.3* 05/01/2014   PLT 322 05/01/2014   Lab Results  Component Value Date   NA 139 05/01/2014   K 4.2 05/01/2014   BUN 11 05/01/2014   CREATININE 1.25 05/01/2014   Lab Results  Component Value Date   NA 139 05/01/2014   K 4.2 05/01/2014   CL 103 05/01/2014   CO2 21 05/01/2014   GLUCOSE 118* 05/01/2014   BUN 11 05/01/2014   CREATININE 1.25 05/01/2014   CALCIUM 8.8 05/01/2014   ALBUMIN 3.4* 04/29/2014   AST 31 04/29/2014   ALT 22 04/29/2014   ALKPHOS 68 04/29/2014   BILITOT 0.5 04/29/2014   GFRNONAA  49* 05/01/2014   GFRAA 57* 05/01/2014   Lab Results  Component Value Date   EQASTMHD62 229 04/29/2014      Past Medical History  Diagnosis Date  . CAD (coronary artery disease)     s/p NSTEMI and BMS stent diagonal07/09;  Lexiscan Myoview 9/13:  EF 62%, no ischemia  . AS (aortic stenosis)     a.  Echo 2009 mean gradient 34mm HG. AVA 2.18;  b.  Echo 4/11 mild AS mean gradient 58mm HG;  c.  Echo 04/2011: Mild LVH, EF 79-89%, grade 1 diastolic dysfunction, mild aortic stenosis, mean gradient 14, mild LAE;  d. Echo 9/13: mod LVH, EF 50-55%, mild to mod AS, mean gradient 17 mmHg  . AF (paroxysmal atrial fibrillation)     Holter monitor 2.5 sec pauses  . Fatigue     CPX 11/09 VO2  14.4 (75% predicte)d, slope 35.  O2 pulse normal.  VO2 corrected for body weight 17.6.  Marland Kitchen Hypothyroidism   . Hyperlipidemia   . History of prostate cancer   . Allergic rhinitis   . DM (diabetes mellitus)     type II diet controlled  . Shingles   . Left carotid stenosis   . Vitamin B 12 deficiency   . Anemia   . Osteoarthritis   . Nephrolithiasis   . Obesity   . OSA (obstructive  sleep apnea)     AHI 15 PSG 09/06/08  . Hx of colonoscopy   . Stroke   . Anxiety and depression   . PMR (polymyalgia rheumatica) 05/29/2012  . Hypogonadism male 05/29/2012  . Hyperparathyroidism 05/29/2012  . TIA (transient ischemic attack) 05/29/2012  . CKD (chronic kidney disease) stage 4, GFR 15-29 ml/min 05/29/2012  . COPD (chronic obstructive pulmonary disease) 05/29/2012  . Myocardial infarction   . Cancer     prostate ca history   Past Surgical History  Procedure Laterality Date  . Appendectomy    . Hernia repair    . Right carpal tunnel release    . Lumbar spine surgery    . Cystectomy      neck  . Prostatectomy    . Vasectomy    . Total knee arthroplasty      right  . Cholecystectomy     No family status information on file.   History   Social History Narrative  . No narrative on file   REVIEW OF SYSTEMS  DATA  OBTAINED: from patient, nurse, medical record, poor historian GENERAL:  No recent fever, fatigue, change in appetite or weight SKIN: No itch, rash. Thin skin. Large skin tear to right upper arm. No reported drainage or redness. Right hip incision. EYES: No eye pain, dryness or itching  No change in vision EARS: No earache, tinnitus, change in hearing NOSE: No congestion, drainage or bleeding MOUTH/THROAT: No mouth or tooth pain  No sore throat   No difficulty chewing or swallowing RESPIRATORY: Cough without sputum production, wheezing, SOB CARDIAC: No chest pain, palpitations . Edema to the right hip area GI: No abdominal pain  No nausea, vomiting,diarrhea or constipation  No heartburn or reflux  GU: No dysuria, frequency or urgency  No change in urine volume or character No nocturia or change in stream  incontinent MUSCULOSKELETAL: No joint pain, swelling or stiffness  No back pain  No muscle ache, pain. Reports left sided weakness after a CVA. Pain reported with movement to right hip. Frequent falls.  NEUROLOGIC: No dizziness, fainting, headache, numbness  Increased confusion from prior stay at [facility] PSYCHIATRIC: No feelings of anxiety, depression  Sleeps well.  No behavior issue.    PHYSICAL EXAM Filed Vitals:   06/23/14 1305  BP: 164/86  Pulse: 72  Temp: 97 F (36.1 C)  Resp: 18  Weight: 214 lb (97.07 kg)  SpO2: 95%   Body mass index is 32.55 kg/(m^2). GENERAL APPEARANCE: Not well appearing. Sleepy during visit. Able to follow commands, pleasant. SKIN: No diaphoresis, rash, unusual lesions. Right hip incisions with staples intact. No drainage or redness. Some residual swelling and bruising noted.  HEAD: Normocephalic, ecchymotic area to left orbit EYES: Conjunctiva/lids clear. Pupils round, reactive. EOMs intact.  EARS: External exam WNL, canals clear NOSE: No deformity or discharge. MOUTH/THROAT: Lips w/o lesions. Oral mucosa, tongue moist, w/o lesion. Oropharynx w/o  redness or lesions.  NECK: Supple, full ROM. No thyroid tenderness, enlargement or nodule LYMPHATICS: No head, neck or supraclavicular adenopathy RESPIRATORY: Breathing slightly labored. Exp wheeze through out. R>L. Congested cough noted with inhalation.  CARDIOVASCULAR: Heart RRR. No murmur or extra heart sounds  ARTERIAL:BPPP+2  VENOUS: No varicosities.   EDEMA: No peripheral edema. No ascites GASTROINTESTINAL: Abdomen is soft, non-tender, slightly distended w/ normal bowel sounds. No hepatic or splenic enlargement. No mass, ventral or inguinal hernia.  GENITOURINARY: unable to palpate due to body habitus  MUSCULOSKELETAL:Strength 3/5 to BUE. 2/5 to RLE,  3/5 to LLE. Decreased ROM and pain to right hip.   NEUROLOGIC: CN 2-12 intact, Oriented to self, oriented to situation to a degree. Follows commands. Falls asleep during exam easily. Intact sensation to RLE PSYCHIATRIC: Mood and affect appropriate to situation  ASSESSMENT/PLAN  COPD (chronic obstructive pulmonary disease) I was not able to locate any PFTs. He is currently oxygen dependent at 2L. He is ordered MDI's but is not able to follow commands to use them. He wheezing and has a tight cough. Will begin alb/atrovent nebs QID and attempt to wean the oxygen in after these have had a chance to work. We will discontinue the Claritin but continue Mucinex.  Dementia Appears to be worsening. There may be some degree of existing delirium from his hospitalization. He is afebrile and in no acute distress. We will monitor closely for now. Would not begin any meds for this a this time. He may benefit from [place] in the future. He is currently on Risperdal at bedtime. If the staff reports continued hallucinations, this may need to be increased. He did not exhibit this in the room today.  PMR (polymyalgia rheumatica) Continue methotrexate. Monitor for side effects. Not in pain at this time.  Hip fracture, right S/p right hemi. Will begin work with  PT. He is high risk for falls and will need assistance when getting up. He is on Plavix. His incision looks good. He is on tramadol for pain but may need Norco to tolerate PT, which is ordered prn.  CKD (chronic kidney disease) stage 4, GFR 15-29 ml/min Stable, no change. Continue to monitor.  OSA (obstructive sleep apnea) Last pulmonology note states that he was not willing to wear his CPAP machine. Will monitor for hypercarbia  HYPERTENSION, BENIGN Not currently on meds. His BP is elevated today. Monitor BP and if it continues to be elevated would treat given his hx of AS.   Depression He was previously on Zoloft and was changed to Wellbutrin. I do not see any signs of depression. Monitor due to his confusion.  Skin tear of upper arm without complication Continue daily dressing changes.  Unspecified constipation His abd is slightly distended and is most likely restricting his diaphragm. Dulcolax supp x1  LABS: CBC, BMP  Linton Ham M D/ Royal Hawthorn RN, MSN   I have reviewed this patient"s history and  Exam. I agree with the assessment. The major concern is his respiratory status. There is reference to a diagnosis of COPD from review of his primary physician records although PFTs or spirometry is Not found. Will begin routine duonebs and monitor his resp status.   He has had 2 recent discharges from this facility, neither has gone particularly well. He appears to require SNF level care.

## 2014-06-23 NOTE — Assessment & Plan Note (Signed)
His abd is slightly distended and is most likely restricting his diaphragm. Dulcolax supp x1

## 2014-06-23 NOTE — Assessment & Plan Note (Signed)
S/p right hemi. Will begin work with PT. He is high risk for falls and will need assistance when getting up. He is on Plavix. His incision looks good. He is on tramadol for pain but may need Norco to tolerate PT, which is ordered prn.

## 2014-06-23 NOTE — Assessment & Plan Note (Signed)
Continue methotrexate. Monitor for side effects. Not in pain at this time.

## 2014-06-23 NOTE — Assessment & Plan Note (Signed)
Stable, no change. Continue to monitor.

## 2014-06-23 NOTE — Assessment & Plan Note (Signed)
Last pulmonology note states that he was not willing to wear his CPAP machine. Will monitor for hypercarbia

## 2014-06-24 ENCOUNTER — Telehealth: Payer: Self-pay | Admitting: Internal Medicine

## 2014-06-24 NOTE — Telephone Encounter (Signed)
Relevant patient education assigned to patient using Emmi. ° °

## 2014-06-29 ENCOUNTER — Non-Acute Institutional Stay (SKILLED_NURSING_FACILITY): Payer: Medicare Other | Admitting: Internal Medicine

## 2014-06-29 DIAGNOSIS — D62 Acute posthemorrhagic anemia: Secondary | ICD-10-CM

## 2014-06-29 DIAGNOSIS — D7589 Other specified diseases of blood and blood-forming organs: Secondary | ICD-10-CM

## 2014-07-02 ENCOUNTER — Non-Acute Institutional Stay (SKILLED_NURSING_FACILITY): Payer: Medicare Other | Admitting: Internal Medicine

## 2014-07-02 ENCOUNTER — Other Ambulatory Visit (HOSPITAL_COMMUNITY): Payer: Self-pay | Admitting: Internal Medicine

## 2014-07-02 DIAGNOSIS — R1310 Dysphagia, unspecified: Secondary | ICD-10-CM

## 2014-07-02 DIAGNOSIS — D631 Anemia in chronic kidney disease: Secondary | ICD-10-CM

## 2014-07-02 DIAGNOSIS — N039 Chronic nephritic syndrome with unspecified morphologic changes: Principal | ICD-10-CM

## 2014-07-03 ENCOUNTER — Ambulatory Visit (HOSPITAL_COMMUNITY)
Admission: RE | Admit: 2014-07-03 | Discharge: 2014-07-03 | Disposition: A | Discharge status: Home / Self Care | Payer: Medicare Other | Source: Ambulatory Visit | Attending: Internal Medicine | Admitting: Internal Medicine

## 2014-07-03 DIAGNOSIS — R1310 Dysphagia, unspecified: Secondary | ICD-10-CM | POA: Diagnosis present

## 2014-07-03 DIAGNOSIS — R1312 Dysphagia, oropharyngeal phase: Secondary | ICD-10-CM | POA: Insufficient documentation

## 2014-07-03 DIAGNOSIS — I4891 Unspecified atrial fibrillation: Secondary | ICD-10-CM | POA: Insufficient documentation

## 2014-07-03 DIAGNOSIS — I2510 Atherosclerotic heart disease of native coronary artery without angina pectoris: Secondary | ICD-10-CM | POA: Insufficient documentation

## 2014-07-03 DIAGNOSIS — E119 Type 2 diabetes mellitus without complications: Secondary | ICD-10-CM | POA: Insufficient documentation

## 2014-07-03 NOTE — Procedures (Signed)
Objective Swallowing Evaluation: Modified Barium Swallowing Study  Patient Details  Name: [name] MRN: [id number] Date of Birth: 03-21-1923  Today's Date: 07/03/2014 Time: 0086-7619 SLP Time Calculation (min): 22 min  Past Medical History:  Past Medical History  Diagnosis Date  . CAD (coronary artery disease)     s/p NSTEMI and BMS stent diagonal07/09;  Lexiscan Myoview 9/13:  EF 62%, no ischemia  . AS (aortic stenosis)     a.  Echo 2009 mean gradient 41mm HG. AVA 2.18;  b.  Echo 4/11 mild AS mean gradient 80mm HG;  c.  Echo 04/2011: Mild LVH, EF 50-93%, grade 1 diastolic dysfunction, mild aortic stenosis, mean gradient 14, mild LAE;  d. Echo 9/13: mod LVH, EF 50-55%, mild to mod AS, mean gradient 17 mmHg  . AF (paroxysmal atrial fibrillation)     Holter monitor 2.5 sec pauses  . Fatigue     CPX 11/09 VO2  14.4 (75% predicte)d, slope 35.  O2 pulse normal.  VO2 corrected for body weight 17.6.  Marland Kitchen Hypothyroidism   . Hyperlipidemia   . History of prostate cancer   . Allergic rhinitis   . DM (diabetes mellitus)     type II diet controlled  . Shingles   . Left carotid stenosis   . Vitamin B 12 deficiency   . Anemia   . Osteoarthritis   . Nephrolithiasis   . Obesity   . OSA (obstructive sleep apnea)     AHI 15 PSG 09/06/08  . Hx of colonoscopy   . Stroke   . Anxiety and depression   . PMR (polymyalgia rheumatica) 05/29/2012  . Hypogonadism male 05/29/2012  . Hyperparathyroidism 05/29/2012  . TIA (transient ischemic attack) 05/29/2012  . CKD (chronic kidney disease) stage 4, GFR 15-29 ml/min 05/29/2012  . COPD (chronic obstructive pulmonary disease) 05/29/2012  . Myocardial infarction   . Cancer     prostate ca history   Past Surgical History:  Past Surgical History  Procedure Laterality Date  . Appendectomy    . Hernia repair    . Right carpal tunnel release    . Lumbar spine surgery    . Cystectomy      neck  . Prostatectomy    . Vasectomy    . Total knee arthroplasty       right  . Cholecystectomy     HPI:  80 y.o. male with PMh of COPD, CKD, PMR, DM, TIA, CAD.  Admitted to [facility] in March 2015 for respiratory failure, pneumonia.  Hospitalized again in May secondary to acute encephalopathy.  Referred today for OPMBS.  During March admission  had  clinical swallow eval on 3/23.  It was recommended that he remain on a regular diet with thin liquids, nectar-thick prn if he was coughing with thins.  Pt reported sensation of stasis in pharynx and occasional "strangling" on liquids.  Prior imaging studies revealed anterior spurring at C5-6.  Per notes, has been on a regular, chopped diet at SNF with nectar-thick liquids.         Assessment / Plan / Recommendation Clinical Impression  Dysphagia Diagnosis: Mild-mod oropharyngeal phase dysphagia  Clinical impression: Pt presents with a mild-mod oropharyngeal dysphagia marked by: 1) reduced bolus propulsion; 2) significant vallecular residue for all consistencies (chin tuck effective in reducing degree of residue); 3) reduced mobility of hyolaryngeal complex, leading to high penetration of all tested consistencies and trace aspiration of thin liquids.  Aspiration consistently elicited a cough.  Use of  a chin tuck also noted to be effective in minimizing aspiration.  Recommend continued regular diet, thin liquids with chin tuck; thicken to nectar if aspiration symptoms are not reduced.  Recommend SLP treatment to address lingual and pharyngeal strengthening.      Treatment Recommendation  Defer treatment plan to SLP at (Comment)    Diet Recommendation Regular;Thin liquid   Liquid Administration via: Cup Medication Administration: Crushed with puree (due to vallecular pooling) Supervision: Patient able to self feed;Full supervision/cueing for compensatory strategies Compensations: Clear throat intermittently Postural Changes and/or Swallow Maneuvers: Chin tuck    Other  Recommendations Oral Care Recommendations: Oral care  BID   Follow Up Recommendations  Skilled Nursing facility           SLP Swallow Goals     General HPI: 80 y.o. male with PMh of COPD, CKD, PMR, DM, TIA, CAD.  Admitted to [facility] in March 2015 for respiratory failure, pneumonia.  Hospitalized again in May secondary to acute encephalopathy.  Referred today for OPMBS.  During March admission  had  clinical swallow eval on 3/23.  It was recommended that he remain on a regular diet with thin liquids, nectar-thick prn if he was coughing with thins.  Pt reported sensation of stasis in pharynx and occasional "strangling" on liquids.  Prior imaging studies revealed anterior spurring at C5-6.  Per notes, has been on a regular, chopped diet at SNF with nectar-thick liquids.     Type of Study: Modified Barium Swallowing Study Reason for Referral: Objectively evaluate swallowing function Previous Swallow Assessment: see history Diet Prior to this Study: Regular;Nectar-thick liquids Temperature Spikes Noted: No Respiratory Status: Nasal cannula History of Recent Intubation: No Behavior/Cognition: Alert;Cooperative;Pleasant mood Oral Cavity - Dentition: Adequate natural dentition Oral Motor / Sensory Function: Within functional limits Self-Feeding Abilities: Able to feed self Patient Positioning: Upright in chair Baseline Vocal Quality: Clear Volitional Cough: Strong Volitional Swallow: Able to elicit Pharyngeal Secretions: Not observed secondary MBS    Reason for Referral Objectively evaluate swallowing function   Oral Phase Oral Preparation/Oral Phase Oral Phase: Impaired Oral - Nectar Oral - Nectar Cup: Reduced posterior propulsion Oral - Thin Oral - Thin Cup: Reduced posterior propulsion Oral - Solids Oral - Puree: Reduced posterior propulsion Oral - Mechanical Soft: Reduced posterior propulsion   Pharyngeal Phase Pharyngeal Phase Pharyngeal Phase: Impaired Pharyngeal - Nectar Pharyngeal - Nectar Cup: Premature spillage to  valleculae;Reduced laryngeal elevation;Reduced tongue base retraction;Penetration/Aspiration during swallow;Pharyngeal residue - valleculae Penetration/Aspiration details (nectar cup): Material enters airway, remains ABOVE vocal cords then ejected out Pharyngeal - Thin Pharyngeal - Thin Cup: Reduced laryngeal elevation;Reduced tongue base retraction;Penetration/Aspiration during swallow;Pharyngeal residue - valleculae;Premature spillage to pyriform sinuses Penetration/Aspiration details (thin cup): Material enters airway, passes BELOW cords and not ejected out despite cough attempt by patient Pharyngeal - Solids Pharyngeal - Puree: Premature spillage to valleculae;Reduced laryngeal elevation;Reduced tongue base retraction;Penetration/Aspiration during swallow;Pharyngeal residue - valleculae Penetration/Aspiration details (puree): Material enters airway, remains ABOVE vocal cords then ejected out Pharyngeal - Regular: Reduced laryngeal elevation;Reduced tongue base retraction;Penetration/Aspiration during swallow;Pharyngeal residue - valleculae Penetration/Aspiration details (regular): Material enters airway, remains ABOVE vocal cords then ejected out  Cervical Esophageal Phase    GO         Functional Assessment Tool Used: clinical judgement Functional Limitations: Swallowing Swallow Current Status ([id number]): At least 20 percent but less than 40 percent impaired, limited or restricted Swallow Goal Status [id number]): At least 20 percent but less than 40 percent impaired, limited or restricted Swallow Discharge  Status [phone]): At least 20 percent but less than 40 percent impaired, limited or restricted   Eugen Jeansonne L. Tivis Ringer, [place] CCC/SLP Pager [phone]  Juan Quam Laurice 07/03/2014, 1:02 PM

## 2014-07-06 ENCOUNTER — Non-Acute Institutional Stay (SKILLED_NURSING_FACILITY): Payer: Medicare Other | Admitting: Internal Medicine

## 2014-07-06 DIAGNOSIS — R059 Cough, unspecified: Secondary | ICD-10-CM

## 2014-07-06 DIAGNOSIS — H109 Unspecified conjunctivitis: Secondary | ICD-10-CM

## 2014-07-06 DIAGNOSIS — R05 Cough: Secondary | ICD-10-CM

## 2014-07-07 DIAGNOSIS — D62 Acute posthemorrhagic anemia: Secondary | ICD-10-CM | POA: Insufficient documentation

## 2014-07-07 NOTE — Progress Notes (Signed)
Patient ID: MELBURN TREIBER, male   DOB: [birth year], 80 y.o.   MRN: [id number]            PROGRESS NOTE  DATE: 06/29/2014       FACILITY:  [facility]  LEVEL OF CARE: SNF (31)  Acute Visit  CHIEF COMPLAINT:  Manage acute blood loss anemia.    HISTORY OF PRESENT ILLNESS: I was requested by the staff to assess the patient regarding above problem(s):  ANEMIA: The anemia has been stable. The patient denies fatigue, melena or hematochezia. No complications from the medications currently being used.  On 06/24/2014:  Hemoglobin 8.8, MCV 99.  In 05/2014:  Hemoglobin 9.1.  Patient is status post right hemiarthroplasty.  Patient overall is a poor historian.     PAST MEDICAL HISTORY : Reviewed.  No changes/see problem list  CURRENT MEDICATIONS: Reviewed per MAR/see medication list  REVIEW OF SYSTEMS:  Difficult to obtain due to patient being a poor historian.    PHYSICAL EXAMINATION  VS:  T 98.6       P 60      RR 18      BP 120/76             GENERAL: no acute distress, moderately obese body habitus NECK: supple, trachea midline, no neck masses, no thyroid tenderness, no thyromegaly RESPIRATORY: breathing is even & unlabored, BS CTAB CARDIAC: RRR, no murmur,no extra heart sounds, no edema GI: abdomen soft, normal BS, no masses, no tenderness, no hepatomegaly, no splenomegaly PSYCHIATRIC: the patient is alert & oriented to person, affect & behavior appropriate  ASSESSMENT/PLAN:  Acute blood loss anemia.  Unstable problem.  Hemoglobin has declined.  Recheck hemoglobin level.    Macrocytosis.  Check RBC folate and vitamin B12 level.    CPT CODE: 41660          Jimia Gentles Y Coltin Casher, [place] [phone]

## 2014-07-08 DIAGNOSIS — N189 Chronic kidney disease, unspecified: Secondary | ICD-10-CM

## 2014-07-08 DIAGNOSIS — D631 Anemia in chronic kidney disease: Secondary | ICD-10-CM | POA: Insufficient documentation

## 2014-07-08 NOTE — Progress Notes (Signed)
Patient ID: [name], male   DOB: 05/11/1923, 80 y.o.   MRN: [id number]           PROGRESS NOTE  DATE: 07/02/2014        FACILITY:  [facility]  LEVEL OF CARE: SNF (31)  Acute Visit  CHIEF COMPLAINT:  Manage anemia.    HISTORY OF PRESENT ILLNESS: I was requested by the staff to assess the patient regarding above problem(s):  ANEMIA: The anemia has been stable. The patient denies fatigue, melena or hematochezia. No complications from the medications currently being used.  On 06/30/2014:  Hemoglobin 9.9.  In 05/2014:  Hemoglobin 9.1.    PAST MEDICAL HISTORY : Reviewed.  No changes/see problem list  CURRENT MEDICATIONS: Reviewed per MAR/see medication list  PHYSICAL EXAMINATION  VS:  T 97.5       P 90      RR 18      BP 130/82     POX 96%              GENERAL: no acute distress, moderately obese body habitus RESPIRATORY: breathing is even & unlabored, BS CTAB CARDIAC: RRR, no murmur,no extra heart sounds, +1 bilateral lower extremity edema      ASSESSMENT/PLAN:  Anemia.  Hemoglobin improved.   We will monitor.    CPT CODE: 67591          Gayani Y Dasanayaka, [place] [phone]

## 2014-07-13 ENCOUNTER — Non-Acute Institutional Stay (SKILLED_NURSING_FACILITY): Payer: Medicare Other | Admitting: Internal Medicine

## 2014-07-13 DIAGNOSIS — J449 Chronic obstructive pulmonary disease, unspecified: Secondary | ICD-10-CM

## 2014-07-13 DIAGNOSIS — I2510 Atherosclerotic heart disease of native coronary artery without angina pectoris: Secondary | ICD-10-CM

## 2014-07-13 DIAGNOSIS — I4891 Unspecified atrial fibrillation: Secondary | ICD-10-CM

## 2014-07-13 DIAGNOSIS — J4489 Other specified chronic obstructive pulmonary disease: Secondary | ICD-10-CM

## 2014-07-13 DIAGNOSIS — I480 Paroxysmal atrial fibrillation: Secondary | ICD-10-CM

## 2014-07-13 DIAGNOSIS — E039 Hypothyroidism, unspecified: Secondary | ICD-10-CM

## 2014-07-13 NOTE — Progress Notes (Signed)
         PROGRESS NOTE  DATE: 07-13-14  FACILITY: Nursing Home Location: [facility]  LEVEL OF CARE: SNF (31)  Routine Visit  CHIEF COMPLAINT:  Manage COPD, atrial fibrillation and hypothyroidism  HISTORY OF PRESENT ILLNESS:  REASSESSMENT OF ONGOING PROBLEM(S):  COPD: the COPD remains stable.  Pt denies sob, cough, wheezing or declining exercise tolerance.  No complications from the medications presently being used.  ATRIAL FIBRILLATION: the patients atrial fibrillation remains stable.  The patient denies DOE, tachycardia, orthopnea, transient neurological sx, pedal edema, palpitations, & PNDs.  No complications noted from the medications currently being used.  HYPOTHYROIDISM: The hypothyroidism remains stable. No complications noted from the medications presently being used.  The patient denies fatigue or constipation.  Last TSH not available.  PAST MEDICAL HISTORY : Reviewed.  No changes/see problem list  CURRENT MEDICATIONS: Reviewed per MAR/see medication list  REVIEW OF SYSTEMS:  GENERAL: no change in appetite, no fatigue, no weight changes, no fever, chills or weakness RESPIRATORY: no cough, SOB, DOE, wheezing, hemoptysis CARDIAC: no chest pain, edema or palpitations GI: no abdominal pain, diarrhea, constipation, heart burn, nausea or vomiting  PHYSICAL EXAMINATION  VS:  See VS section  GENERAL: no acute distress, moderately obese body habitus NECK: supple, trachea midline, no neck masses, no thyroid tenderness, no thyromegaly RESPIRATORY: breathing is even & unlabored, BS CTAB CARDIAC: Heart rate is irregularly irregular, no murmur,no extra heart sounds, +1 bilateral lower extremity edema GI: abdomen soft, normal BS, no masses, no tenderness, no hepatomegaly, no splenomegaly PSYCHIATRIC: the patient is alert & oriented to person, affect & behavior appropriate  LABS/RADIOLOGY: 7-15 vitamin B12 level 336, folate greater than 19.9, hemoglobin  9.9 otherwise CBC normal, BMP normal 5-15 MCV 103 otherwise CBC normal , creatinine 1.39, glucose 107 otherwise BMP normal, vitamin B12 level 241, folate level 17.7  3-15 liver profile normal  ASSESSMENT/PLAN:  COPD-compensated Atrial fibrillation-rate controlled Hypothyroidism-check TSH. Continue levothyroxine. CAD-stable Diabetes mellitus-diet controlled. Check hemoglobin A1c. Aortic stenosis-asymptomatic Chronic kidney disease stage IV-renal functions improved Left eye conjunctivitis-sx much improved on gentamicin eye drops Constipation-continue laxatives GERD-Prilosec was started  CPT CODE: 15520  Kurk Corniel Y Naba Sneed, [place] [phone]

## 2014-07-13 NOTE — Progress Notes (Signed)
Patient ID: [name], male   DOB: 1923/04/13, 80 y.o.   MRN: [id number]           PROGRESS NOTE  DATE: 07/06/2014         FACILITY:  [facility]  LEVEL OF CARE: SNF (31)  Acute Visit  CHIEF COMPLAINT:  Manage left eye redness and discharge, and cough.      HISTORY OF PRESENT ILLNESS: I was requested by the staff to assess the patient regarding above problem(s):  LEFT EYE REDNESS AND DISCHARGE:  The patient's family is concerned that patient has redness in his left eye.  The patient is complaining of discharge, but denies visual disturbances or pain.  Symptoms have been present for one day.    COUGH:  The patient is complaining of a productive cough, but denies shortness of breath.  He has a history of COPD.  He is on Robitussin and Claritin.  He denies any heartburn symptoms.    PAST MEDICAL HISTORY : Reviewed.  No changes/see problem list  CURRENT MEDICATIONS: Reviewed per MAR/see medication list  REVIEW OF SYSTEMS:  GENERAL: no change in appetite, no fatigue, no weight changes, no fever, chills or weakness RESPIRATORY: cough; no SOB, DOE,, wheezing, hemoptysis CARDIAC: no chest pain, edema or palpitations GI: no abdominal pain, diarrhea, constipation, heart burn, nausea or vomiting  PHYSICAL EXAMINATION  VS:  T 97.7       P 76      RR 18      BP 90/60     POX 96%       WT (Lb) 214        GENERAL: no acute distress, moderately obese body habitus EYES: left eye has conjunctival erythema and yellow discharge, right eye is normal       NECK: supple, trachea midline, no neck masses, no thyroid tenderness, no thyromegaly LYMPHATICS: no LAN in the neck, no supraclavicular LAN RESPIRATORY: breathing is even & unlabored, BS CTAB CARDIAC: RRR, no murmur,no extra heart sounds, no edema GI: abdomen soft, normal BS, no masses, no tenderness, no hepatomegaly, no splenomegaly PSYCHIATRIC: the patient is alert & oriented to person, affect & behavior  appropriate  ASSESSMENT/PLAN:  Left eye conjunctivitis.  New problem.  Start gentamicin ophthalmic 0.3%, 2 drops b.i.d. for one week and also place on Artificial Tears 2 drops b.i.d. to bilateral eyes on family request.    Cough.  Ongoing, unstable problem.  Likely secondary to his COPD.  However, we will add Prilosec 20 mg q.d. and monitor.    CPT CODE: 09323         [name] Y [name], [place] [phone]

## 2014-07-16 ENCOUNTER — Non-Acute Institutional Stay (SKILLED_NURSING_FACILITY): Payer: Medicare Other | Admitting: Internal Medicine

## 2014-07-16 DIAGNOSIS — R042 Hemoptysis: Secondary | ICD-10-CM

## 2014-07-16 DIAGNOSIS — R4182 Altered mental status, unspecified: Secondary | ICD-10-CM

## 2014-07-17 ENCOUNTER — Non-Acute Institutional Stay (SKILLED_NURSING_FACILITY): Payer: Medicare Other | Admitting: Internal Medicine

## 2014-07-17 DIAGNOSIS — J449 Chronic obstructive pulmonary disease, unspecified: Secondary | ICD-10-CM

## 2014-07-20 ENCOUNTER — Non-Acute Institutional Stay (SKILLED_NURSING_FACILITY): Payer: Medicare Other | Admitting: Internal Medicine

## 2014-07-20 ENCOUNTER — Other Ambulatory Visit: Payer: Self-pay | Admitting: *Deleted

## 2014-07-20 DIAGNOSIS — E1129 Type 2 diabetes mellitus with other diabetic kidney complication: Secondary | ICD-10-CM

## 2014-07-20 DIAGNOSIS — D7589 Other specified diseases of blood and blood-forming organs: Secondary | ICD-10-CM

## 2014-07-20 DIAGNOSIS — N039 Chronic nephritic syndrome with unspecified morphologic changes: Secondary | ICD-10-CM

## 2014-07-20 DIAGNOSIS — D631 Anemia in chronic kidney disease: Secondary | ICD-10-CM

## 2014-07-20 DIAGNOSIS — N184 Chronic kidney disease, stage 4 (severe): Secondary | ICD-10-CM

## 2014-07-20 MED ORDER — TRAMADOL HCL 50 MG PO TABS: ORAL_TABLET | ORAL | Status: DC

## 2014-07-20 MED ORDER — HYDROCODONE-ACETAMINOPHEN 10-325 MG PO TABS: ORAL_TABLET | ORAL | Status: DC

## 2014-07-20 NOTE — Telephone Encounter (Signed)
[facility] 

## 2014-07-21 NOTE — Progress Notes (Addendum)
Patient ID: [name], male   DOB: [birth year], 80 y.o.   MRN: [id number]               PROGRESS NOTE  DATE:  07/17/2014    FACILITY: [facility]     LEVEL OF CARE:   SNF   Acute Visit   CHIEF COMPLAINT:  Cough, ?increasing confusion, review of abnormal chest x-ray.     HISTORY OF PRESENT ILLNESS:  This is a patient who has significant COPD, although he is not oxygen-dependent.  He also has a history of aortic stenosis, chronic renal failure.    He has been noted by staff to have a cough.  This led to a chest x-ray that was done on 07/16/2014 suggesting mild patchy density at the right lung indicating pneumonia, but there has been improvement since 06/17/2014.  He has been followed by Speech Language Pathology, felt to have intermittent aspiration with thin liquids.  He is supposed to be doing a chin-tuck maneuver.  I do not actually see the previous x-ray report that is quoted.    He appears to have come here from [facility] after suffering a right subcapital neck fracture.  I believe he was in the building, however, prior to this.    REVIEW OF SYSTEMS:   GENERAL:  The patient states he feels "sick".   CHEST/RESPIRATORY:  He denies coughing, but he is clearly coughing during my exam.   Apparently, some of this is associated with hemoptysis and/or blood-tinged sputum, although I did not witness this.   CARDIAC:   No chest pain.   GI:  No nausea,  vomiting or abdominal pain.     PHYSICAL EXAMINATION:   VITAL SIGNS:   O2 SATURATIONS:  95% on room air.   RESPIRATIONS:  20 and unlabored.   PULSE:  70 and regular.   GENERAL APPEARANCE:  The patient is not in any distress.   CHEST/RESPIRATORY:  There are a few crackles in the right lower lobe.  The left lung is shallow, but clear.  He is not in respiratory distress.   CARDIOVASCULAR:  CARDIAC:   Heart sounds are normal.  I did not appreciate the aortic stenosis.  JVP is not elevated.   He appears to be euvolemic.     GASTROINTESTINAL:  ABDOMEN:    Distended.  No tenderness is noted.   CIRCULATION:   EDEMA/VARICOSITIES:  Extremities:  No evidence of a DVT.     ASSESSMENT/PLAN:  COPD.  He  apparently has blood-tinged sputum.  He may have bronchitis.  I will cover him with an antibiotic.  Start him on Avelox.    Increasing confusion.  He has been seen by Psychiatry and I believe had his Risperdal increased.    Also, work-up including lab work, CMP, urinalysis, C&S, and TSH are all still pending.  I will also put him on regular vital signs.

## 2014-07-21 NOTE — Progress Notes (Signed)
Patient ID: [name], male   DOB: 06-19-1923, 80 y.o.   MRN: [id number]           PROGRESS NOTE  DATE: 07/16/2014         FACILITY:  [facility]  LEVEL OF CARE: SNF (31)  Acute Visit  CHIEF COMPLAINT:  Manage altered mental status.    HISTORY OF PRESENT ILLNESS: I was requested by the staff to assess the patient regarding above problem(s):  Family is concerned that patient is having increased confusion and overall decline since fracture of his hip.  Staff also report that he coughed up blood-tinged sputum on 07/14/2014 and 07/15/2014.  Patient overall is a poor historian.   Staff cannot identify precipitating or alleviating factors.  There is no temporal relationship.    PAST MEDICAL HISTORY : Reviewed.  No changes/see problem list  CURRENT MEDICATIONS: Reviewed per MAR/see medication list  REVIEW OF SYSTEMS:  Unobtainable.  Patient is a poor historian.       PHYSICAL EXAMINATION  VS:  T 97.7       P 70      RR 18      BP 110/62              GENERAL: no acute distress, moderately obese body habitus EYES: conjunctivae normal, sclerae normal, normal eye lids NECK: supple, trachea midline, no neck masses, no thyroid tenderness, no thyromegaly LYMPHATICS: no LAN in the neck, no supraclavicular LAN RESPIRATORY: breathing is even & unlabored, BS CTAB CARDIAC: RRR, no murmur,no extra heart sounds, no edema GI: abdomen soft, normal BS, no masses, no tenderness, no hepatomegaly, no splenomegaly PSYCHIATRIC: the patient is alert & oriented to person only, lethargic         ASSESSMENT/PLAN:  Altered mental status.  New onset.  Significant problem.  Obtain CBC with diff, CMP, TSH, UA, culture and sensitivities.  His Risperdal was increased on 07/14/2014 and at this point I will keep that dose.  Also, we will keep him on the Wellbutrin.    Hemoptysis.  Obtain chest x-ray.    CPT CODE: 19758           [name], [place] [phone]

## 2014-07-22 ENCOUNTER — Non-Acute Institutional Stay (SKILLED_NURSING_FACILITY): Payer: Medicare Other | Admitting: Internal Medicine

## 2014-07-22 DIAGNOSIS — N179 Acute kidney failure, unspecified: Secondary | ICD-10-CM

## 2014-07-23 DIAGNOSIS — IMO0002 Reserved for concepts with insufficient information to code with codable children: Secondary | ICD-10-CM | POA: Insufficient documentation

## 2014-07-23 DIAGNOSIS — E1165 Type 2 diabetes mellitus with hyperglycemia: Secondary | ICD-10-CM

## 2014-07-23 DIAGNOSIS — E1129 Type 2 diabetes mellitus with other diabetic kidney complication: Secondary | ICD-10-CM | POA: Insufficient documentation

## 2014-07-23 NOTE — Progress Notes (Signed)
Patient ID: [name], male   DOB: 08/09/1923, 80 y.o.   MRN: [id number]           PROGRESS NOTE  DATE: 07/20/2014        FACILITY:  [facility]  LEVEL OF CARE: SNF (31)  Acute Visit  CHIEF COMPLAINT:  Manage chronic kidney disease stage IV and anemia of chronic kidney disease.    HISTORY OF PRESENT ILLNESS: I was requested by the staff to assess the patient regarding above problem(s):  CHRONIC KIDNEY DISEASE: The patient's chronic kidney disease is unstable.  Patient denies increasing lower extremity swelling or confusion. Last BUN and creatinine are:   On 07/17/2014:  BUN 13, creatinine 1.61.  On 06/24/2014:  BUN 16, creatinine 1.17.    ANEMIA: The anemia has been stable. The patient denies fatigue, melena or hematochezia. No complications from the medications currently being used.  On 07/17/2014:  Hemoglobin 11.1, MCV 101.  On 06/24/2014:  Hemoglobin 8.8, MCV 99.7.    PAST MEDICAL HISTORY : Reviewed.  No changes/see problem list  CURRENT MEDICATIONS: Reviewed per MAR/see medication list  REVIEW OF SYSTEMS:  GENERAL: no change in appetite, no fatigue, no weight changes, no fever, chills or weakness RESPIRATORY: no cough, SOB, DOE,, wheezing, hemoptysis CARDIAC: no chest pain, edema or palpitations GI: no abdominal pain, diarrhea, constipation, heart burn, nausea or vomiting  PHYSICAL EXAMINATION  VS: see VS section  GENERAL: no acute distress, moderately obese body habitus EYES: conjunctivae normal, sclerae normal, normal eye lids NECK: supple, trachea midline, no neck masses, no thyroid tenderness, no thyromegaly LYMPHATICS: no LAN in the neck, no supraclavicular LAN RESPIRATORY: breathing is even & unlabored, BS CTAB CARDIAC: RRR, no murmur,no extra heart sounds, +1 bilateral lower extremity edema     GI: abdomen soft, normal BS, no masses, no tenderness, no hepatomegaly, no splenomegaly PSYCHIATRIC: the patient is alert & oriented to person,  affect & behavior appropriate  LABS/RADIOLOGY:  06/30/2014:   Vitamin B12 level 336, folate level greater than 19.9.    07/17/2014:  Blood glucose level 125.    07/14/2014:  Hemoglobin A1c 5.5.    ASSESSMENT/PLAN:  Chronic kidney disease stage IV.  Unstable problem.  Renal functions elevated.  Push fluids and reassess.    Anemia of chronic kidney disease.  Hemoglobin improved.      Macrocytosis.  Folate and B12 levels normal.    Diabetes mellitus.  Well controlled on diet.    CPT CODE: 16073            [name] Y [name], [place] [phone]

## 2014-07-27 ENCOUNTER — Non-Acute Institutional Stay (SKILLED_NURSING_FACILITY): Payer: Medicare Other | Admitting: Internal Medicine

## 2014-07-27 DIAGNOSIS — N179 Acute kidney failure, unspecified: Secondary | ICD-10-CM

## 2014-07-27 DIAGNOSIS — I150 Renovascular hypertension: Secondary | ICD-10-CM

## 2014-07-27 NOTE — Progress Notes (Signed)
Patient ID: [name], male   DOB: 08-31-1923, 80 y.o.   MRN: [id number]           PROGRESS NOTE  DATE: 07/22/2014         FACILITY:  [facility]  LEVEL OF CARE: SNF (31)  Acute Visit  CHIEF COMPLAINT:  Manage acute renal failure.    HISTORY OF PRESENT ILLNESS: I was requested by the staff to assess the patient regarding above problem(s):  ACUTE RENAL FAILURE:  On 07/21/2014:  BUN 14, creatinine 1.68.  On 07/17/2014:  Creatinine 1.61.  On 06/24/2014:  Creatinine 1.17.  Patient denies confusion or lower extremity swelling.  He does not have a history of chronic kidney disease and he is not on renal toxic medications.   PAST MEDICAL HISTORY : Reviewed.  No changes/see problem list  CURRENT MEDICATIONS: Reviewed per MAR/see medication list  REVIEW OF SYSTEMS:  GENERAL: no change in appetite, no fatigue, no weight changes, no fever, chills or weakness RESPIRATORY: no cough, SOB, DOE,, wheezing, hemoptysis CARDIAC: no chest pain, edema or palpitations GI: no abdominal pain, diarrhea, constipation, heart burn, nausea or vomiting  PHYSICAL EXAMINATION  VS: see VS section  GENERAL: no acute distress, moderately obese body habitus EYES: conjunctivae normal, sclerae normal, normal eye lids NECK: supple, trachea midline, no neck masses, no thyroid tenderness, no thyromegaly LYMPHATICS: no LAN in the neck, no supraclavicular LAN RESPIRATORY: breathing is even & unlabored, BS CTAB CARDIAC: RRR, no murmur,no extra heart sounds, no edema GI: abdomen soft, normal BS, no masses, no tenderness, no hepatomegaly, no splenomegaly PSYCHIATRIC: the patient is alert & oriented to person, affect & behavior appropriate  ASSESSMENT/PLAN:  Acute renal failure.   Significant problem.  Renal functions continue to rise.  We will obtain a renal ultrasound.  Also, give 2 L of half-normal saline and reassess.    CPT CODE: 70488        [name], [place] [phone]

## 2014-07-28 NOTE — Progress Notes (Signed)
         PROGRESS NOTE  DATE: 07/27/2014  FACILITY:  [facility]  LEVEL OF CARE: SNF (31)  Acute Visit  CHIEF COMPLAINT:  Manage acute renal failure and hypertension  HISTORY OF PRESENT ILLNESS: I was requested by the staff to assess the patient regarding above problem(s):  HTN: Pt 's HTN is unstable.  Staff Deny CP, sob, DOE, pedal edema, headaches, dizziness or visual disturbances.  No complications from the medications currently being used.  Last BP : 154/86. Patient is a poor historian.  ARF: on 07-24-14 BUN 14, creatinine 1.47. On 07-21-14 creatinine 1.68. Patient received IV fluids. Patient has intermittent confusion. There is no increasing lower extremity swelling.  PAST MEDICAL HISTORY : Reviewed.  No changes/see problem list  CURRENT MEDICATIONS: Reviewed per MAR/see medication list  REVIEW OF SYSTEMS: Unobtainable-patient is a poor historian  PHYSICAL EXAMINATION  VS: see VS section  GENERAL: no acute distress, moderately obese body habitus NECK: supple, trachea midline, no neck masses, no thyroid tenderness, no thyromegaly RESPIRATORY: breathing is even & unlabored, BS CTAB CARDIAC: RRR, no murmur,no extra heart sounds, no edema GI: abdomen soft, normal BS, no masses, no tenderness, no hepatomegaly, no splenomegaly PSYCHIATRIC: the patient is alert & disoriented, depressed affect & behavior appropriate  LABS/RADIOLOGY: See history of present illness  ASSESSMENT/PLAN:  Acute renal failure-renal functions improved after IV fluids. He is not on renal toxic medications. Renal ultrasound is pending. Renal vascular hypertension-last blood pressure elevated. We'll review a log.  CPT CODE: 19622  Gwynne Kemnitz Y Shereese Bonnie, [place] [phone]

## 2014-08-03 ENCOUNTER — Non-Acute Institutional Stay (SKILLED_NURSING_FACILITY): Payer: Medicare Other | Admitting: Internal Medicine

## 2014-08-03 DIAGNOSIS — J449 Chronic obstructive pulmonary disease, unspecified: Secondary | ICD-10-CM

## 2014-08-03 DIAGNOSIS — I4891 Unspecified atrial fibrillation: Secondary | ICD-10-CM

## 2014-08-03 DIAGNOSIS — I480 Paroxysmal atrial fibrillation: Secondary | ICD-10-CM

## 2014-08-03 DIAGNOSIS — I2510 Atherosclerotic heart disease of native coronary artery without angina pectoris: Secondary | ICD-10-CM

## 2014-08-03 DIAGNOSIS — E039 Hypothyroidism, unspecified: Secondary | ICD-10-CM

## 2014-08-04 NOTE — Progress Notes (Signed)
         PROGRESS NOTE  DATE: 08-03-14  FACILITY: Nursing Home Location: [facility]  LEVEL OF CARE: SNF (31)  Routine Visit  CHIEF COMPLAINT:  Manage COPD, atrial fibrillation and hypothyroidism  HISTORY OF PRESENT ILLNESS:  REASSESSMENT OF ONGOING PROBLEM(S):  COPD: the COPD remains stable.  Pt denies sob, cough, wheezing or declining exercise tolerance.  No complications from the medications presently being used.  ATRIAL FIBRILLATION: the patients atrial fibrillation remains stable.  The patient denies DOE, tachycardia, orthopnea, transient neurological sx, pedal edema, palpitations, & PNDs.  No complications noted from the medications currently being used.  HYPOTHYROIDISM: The hypothyroidism remains stable. No complications noted from the medications presently being used.  The patient denies fatigue or constipation.  Last TSH is 2.07 in 7-15.  PAST MEDICAL HISTORY : Reviewed.  No changes/see problem list  CURRENT MEDICATIONS: Reviewed per MAR/see medication list  REVIEW OF SYSTEMS:  GENERAL: no change in appetite, no fatigue, no weight changes, no fever, chills or weakness RESPIRATORY: no cough, SOB, DOE, wheezing, hemoptysis CARDIAC: no chest pain, edema or palpitations GI: no abdominal pain, diarrhea, constipation, heart burn, nausea or vomiting  PHYSICAL EXAMINATION  VS:  See VS section  GENERAL: no acute distress, moderately obese body habitus EYES: Normal sclerae, normal conjunctivae, no discharge NECK: supple, trachea midline, no neck masses, no thyroid tenderness, no thyromegaly LYMPHATICS: No cervical lymphadenopathy, no supraclavicular lymphadenopathy RESPIRATORY: breathing is even & unlabored, BS CTAB CARDIAC: Heart rate is irregularly irregular, no murmur,no extra heart sounds, +1 bilateral lower extremity edema GI: abdomen soft, normal BS, no masses, no tenderness, no hepatomegaly, no splenomegaly PSYCHIATRIC: the patient is alert &  oriented to person, affect & behavior appropriate  LABS/RADIOLOGY: 07-24-14 BUN 14, creatinine 1.47 07-18-14 hemoglobin 11.1, MCV 101 otherwise CBC normal, total protein 5.8, albumin 3.4, glucose 125, creatinine 1.61 otherwise CMP normal, hemoglobin A1c 5.5 7-15 vitamin B12 level 336, folate greater than 19.9, hemoglobin 9.9 otherwise CBC normal, BMP normal 5-15 MCV 103 otherwise CBC normal , creatinine 1.39, glucose 107 otherwise BMP normal, vitamin B12 level 241, folate level 17.7  3-15 liver profile normal  ASSESSMENT/PLAN:  COPD-compensated Atrial fibrillation-rate controlled Hypothyroidism-well controlled. Continue levothyroxine. CAD-stable Diabetes mellitus-diet controlled.  Aortic stenosis-asymptomatic Chronic kidney disease stage IV-renal functions improved Constipation-continue laxatives GERD-continue Prilosec Elevated blood pressure -will review a log  CPT CODE: 60630  [name] Y Najee Manninen, MD [place] [phone]

## 2014-08-17 ENCOUNTER — Encounter: Payer: Self-pay | Admitting: Internal Medicine

## 2014-08-17 ENCOUNTER — Ambulatory Visit (INDEPENDENT_AMBULATORY_CARE_PROVIDER_SITE_OTHER): Payer: Medicare Other | Admitting: Internal Medicine

## 2014-08-17 VITALS — BP 90/62 | HR 64 | Resp 16

## 2014-08-17 DIAGNOSIS — R5381 Other malaise: Secondary | ICD-10-CM

## 2014-08-17 DIAGNOSIS — R5383 Other fatigue: Secondary | ICD-10-CM

## 2014-08-17 DIAGNOSIS — F0391 Unspecified dementia with behavioral disturbance: Secondary | ICD-10-CM

## 2014-08-17 DIAGNOSIS — E538 Deficiency of other specified B group vitamins: Secondary | ICD-10-CM

## 2014-08-17 DIAGNOSIS — F03918 Unspecified dementia, unspecified severity, with other behavioral disturbance: Secondary | ICD-10-CM

## 2014-08-17 DIAGNOSIS — I2510 Atherosclerotic heart disease of native coronary artery without angina pectoris: Secondary | ICD-10-CM

## 2014-08-17 NOTE — Progress Notes (Signed)
Pre visit review using our clinic review tool, if applicable. No additional management support is needed unless otherwise documented below in the visit note. 

## 2014-08-17 NOTE — Progress Notes (Signed)
Patient ID: [name], male   DOB: 1923-02-14, 80 y.o.   MRN: [id number]   Subjective:    HPI    C/o somnolence, fatigue  80 year old male with past medical history of COPD, PMR, paroxysmal atrial fibrillation (not on anticoagulation due to risk of falls), CKD, hypothyroidism, CAD, dyslipidemia, recent admission for COPD and atrial fibrillation with RVR   Review of Systems  Constitutional: Positive for fatigue. Negative for appetite change and unexpected weight change.  HENT: Negative for congestion, nosebleeds, sneezing, sore throat and trouble swallowing.   Eyes: Negative for itching and visual disturbance.  Respiratory: Positive for shortness of breath. Negative for cough.   Cardiovascular: Negative for chest pain, palpitations and leg swelling.  Gastrointestinal: Positive for constipation. Negative for nausea, diarrhea, blood in stool and abdominal distention.  Genitourinary: Positive for frequency. Negative for dysuria, hematuria and decreased urine volume.  Musculoskeletal: Positive for arthralgias and myalgias. Negative for back pain, gait problem, joint swelling and neck pain.  Skin: Negative for rash.  Neurological: Negative for dizziness, tremors, syncope, speech difficulty, weakness and headaches.  Psychiatric/Behavioral: Positive for confusion and decreased concentration. Negative for suicidal ideas, sleep disturbance, self-injury, dysphoric mood and agitation. The patient is not nervous/anxious.        Objective:   Physical Exam  Constitutional: He is oriented to person, place, and time. He appears well-developed and well-nourished. No distress.  Obese   HENT:  Head: Normocephalic and atraumatic.  Right Ear: External ear normal.  Left Ear: External ear normal.  Nose: Nose normal.  Mouth/Throat: Oropharynx is clear and moist. No oropharyngeal exudate.  Eyes: Conjunctivae and EOM are normal. Pupils are equal, round, and reactive to light. Right eye exhibits no  discharge. Left eye exhibits no discharge. No scleral icterus.  Neck: Normal range of motion. Neck supple. No JVD present. No tracheal deviation present. No thyromegaly present.  Cardiovascular: Regular rhythm and intact distal pulses.  Exam reveals no gallop and no friction rub.   Murmur heard. Pulmonary/Chest: Effort normal and breath sounds normal. No stridor. No respiratory distress. He has no wheezes. He has no rales. He exhibits no tenderness.  Abdominal: Soft. Bowel sounds are normal. He exhibits no distension and no mass. There is no tenderness. There is no rebound and no guarding.  Musculoskeletal: Normal range of motion. He exhibits edema (trace). He exhibits no tenderness.  Lymphadenopathy:    He has no cervical adenopathy.  Neurological: He is alert and oriented to person, place, and time. He has normal reflexes. No cranial nerve deficit. He exhibits normal muscle tone. Coordination abnormal.  Somnolent, almost unresponsive  Skin: Skin is warm and dry. No rash noted. He is not diaphoretic. No erythema. No pallor.  Psychiatric: He has a normal mood and affect. His behavior is normal. Judgment and thought content normal.  w/c Alert, somnolent, cooperative  Lab Results  Component Value Date   WBC 9.0 05/01/2014   HGB 13.2 05/01/2014   HCT 40.6 05/01/2014   PLT 322 05/01/2014   GLUCOSE 118* 05/01/2014   CHOL 126 05/31/2012   TRIG 94.0 05/31/2012   HDL 50.60 05/31/2012   LDLDIRECT 143.3 04/16/2007   LDLCALC 57 05/31/2012   ALT 22 04/29/2014   AST 31 04/29/2014   NA 139 05/01/2014   K 4.2 05/01/2014   CL 103 05/01/2014   CREATININE 1.25 05/01/2014   BUN 11 05/01/2014   CO2 21 05/01/2014   TSH 1.050 04/29/2014   PSA 2.96 04/21/2008   INR  1.13 03/12/2014   HGBA1C 5.8* 03/04/2014         Assessment & Plan:

## 2014-08-20 NOTE — Assessment & Plan Note (Signed)
Chronic: dementia and meds related

## 2014-08-20 NOTE — Assessment & Plan Note (Signed)
Continue with current prescription therapy as reflected on the Med list.  

## 2014-08-20 NOTE — Assessment & Plan Note (Signed)
Chronic lethargy: dementia and meds related Form for [facility] was filled out

## 2014-08-24 ENCOUNTER — Non-Acute Institutional Stay (SKILLED_NURSING_FACILITY): Payer: Medicare Other | Admitting: Internal Medicine

## 2014-08-24 DIAGNOSIS — F411 Generalized anxiety disorder: Secondary | ICD-10-CM

## 2014-08-26 ENCOUNTER — Non-Acute Institutional Stay (SKILLED_NURSING_FACILITY): Payer: Medicare Other | Admitting: Internal Medicine

## 2014-08-26 DIAGNOSIS — I480 Paroxysmal atrial fibrillation: Secondary | ICD-10-CM

## 2014-08-26 DIAGNOSIS — I4891 Unspecified atrial fibrillation: Secondary | ICD-10-CM

## 2014-08-26 DIAGNOSIS — I2510 Atherosclerotic heart disease of native coronary artery without angina pectoris: Secondary | ICD-10-CM

## 2014-08-26 DIAGNOSIS — J449 Chronic obstructive pulmonary disease, unspecified: Secondary | ICD-10-CM

## 2014-08-26 DIAGNOSIS — E039 Hypothyroidism, unspecified: Secondary | ICD-10-CM

## 2014-08-28 NOTE — Progress Notes (Signed)
         PROGRESS NOTE  DATE: 08-26-14  FACILITY: Nursing Home Location: [facility]  LEVEL OF CARE: SNF (31)  Routine Visit  CHIEF COMPLAINT:  Manage COPD, atrial fibrillation and hypothyroidism  HISTORY OF PRESENT ILLNESS:  REASSESSMENT OF ONGOING PROBLEM(S):  COPD: the COPD remains stable.  Pt denies sob, cough, wheezing or declining exercise tolerance.  No complications from the medications presently being used.  ATRIAL FIBRILLATION: the patients atrial fibrillation remains stable.  The patient denies DOE, tachycardia, orthopnea, transient neurological sx, pedal edema, palpitations, & PNDs.  No complications noted from the medications currently being used.  HYPOTHYROIDISM: The hypothyroidism remains stable. No complications noted from the medications presently being used.  The patient denies fatigue or constipation.  Last TSH is 2.07 in 7-15.  PAST MEDICAL HISTORY : Reviewed.  No changes/see problem list  CURRENT MEDICATIONS: Reviewed per MAR/see medication list  REVIEW OF SYSTEMS:  GENERAL: no change in appetite, no fatigue, no weight changes, no fever, chills or weakness RESPIRATORY: no cough, SOB, DOE, wheezing, hemoptysis CARDIAC: no chest pain, edema or palpitations GI: no abdominal pain, diarrhea, constipation, heart burn, nausea or vomiting  PHYSICAL EXAMINATION  VS:  See VS section  GENERAL: no acute distress, moderately obese body habitus NECK: supple, trachea midline, no neck masses, no thyroid tenderness, no thyromegaly RESPIRATORY: breathing is even & unlabored, BS CTAB CARDIAC: Heart rate is irregularly irregular, no murmur,no extra heart sounds, +1 bilateral lower extremity edema GI: abdomen soft, normal BS, no masses, no tenderness, no hepatomegaly, no splenomegaly PSYCHIATRIC: the patient is alert & oriented to person, affect & behavior appropriate  LABS/RADIOLOGY: 07-24-14 BUN 14, creatinine 1.47 07-18-14 hemoglobin 11.1, MCV 101  otherwise CBC normal, total protein 5.8, albumin 3.4, glucose 125, creatinine 1.61 otherwise CMP normal, hemoglobin A1c 5.5 7-15 vitamin B12 level 336, folate greater than 19.9, hemoglobin 9.9 otherwise CBC normal, BMP normal 5-15 MCV 103 otherwise CBC normal , creatinine 1.39, glucose 107 otherwise BMP normal, vitamin B12 level 241, folate level 17.7  3-15 liver profile normal  ASSESSMENT/PLAN:  COPD-compensated Atrial fibrillation-rate controlled Hypothyroidism-well controlled. Continue levothyroxine. CAD-stable Diabetes mellitus-diet controlled.  Aortic stenosis-asymptomatic Chronic kidney disease stage IV-renal functions improved Constipation-continue laxatives GERD-continue Prilosec Psychosis-risperdal increased Anxiety-lorazepam was started Elevated blood pressure -will review a log  CPT CODE: 76546  Gayani Y Dasanayaka, MD [place] [phone]

## 2014-09-01 ENCOUNTER — Encounter: Payer: Self-pay | Admitting: Internal Medicine

## 2014-09-01 ENCOUNTER — Other Ambulatory Visit: Payer: Self-pay | Admitting: *Deleted

## 2014-09-01 ENCOUNTER — Non-Acute Institutional Stay (SKILLED_NURSING_FACILITY): Payer: Medicare Other | Admitting: Internal Medicine

## 2014-09-01 DIAGNOSIS — I951 Orthostatic hypotension: Secondary | ICD-10-CM

## 2014-09-01 DIAGNOSIS — F0390 Unspecified dementia without behavioral disturbance: Secondary | ICD-10-CM

## 2014-09-01 DIAGNOSIS — M353 Polymyalgia rheumatica: Secondary | ICD-10-CM

## 2014-09-01 DIAGNOSIS — N184 Chronic kidney disease, stage 4 (severe): Secondary | ICD-10-CM

## 2014-09-01 DIAGNOSIS — E039 Hypothyroidism, unspecified: Secondary | ICD-10-CM

## 2014-09-01 DIAGNOSIS — J449 Chronic obstructive pulmonary disease, unspecified: Secondary | ICD-10-CM

## 2014-09-01 DIAGNOSIS — I2510 Atherosclerotic heart disease of native coronary artery without angina pectoris: Secondary | ICD-10-CM

## 2014-09-01 MED ORDER — HYDROCODONE-ACETAMINOPHEN 10-325 MG PO TABS: ORAL_TABLET | ORAL | Status: DC

## 2014-09-01 NOTE — Progress Notes (Signed)
Patient ID: [name], male   DOB: [birth year], 80 y.o.   MRN: [id number]   this is an acute visit.  Level of care skilled.  Facility AF.  Chief complaint-acute visit status post fall-transfer to this facility  HPI: This is an 80 y.o. Male discharged from the [facility] 06/[facility]/14 s/p right hemiarthroplasty. He recently was at [facility] 06/18/14 and fell. Apparently he fell when getting OOB and sustained a right displaced femoral neck fracture at [facility]. Prior to being discharged to [facility] he was seen at [facility] for a COPD exacerbation and was started on antibiotics. He was there for rehabilitation. There have been several attempts to discharge him that have obviously been unsuccessful. He has a hx of falls and encephalopathy.  He was transferred from [facility] to [facility] today-apparently in the [place] he did sustain a fall-apparently did not eat his head but did raise his right ear which appears to have a small abrasion.  Patient is a poor historian at times secondary to dementia but he is not really complaining of any head pain-he does complain of somewhat diffuse joint pain-according to his son who is in the room this is not new he does have a history of polymyalgia rheumatica  It appears looking through his chart he has numerous other medical issues including status history of chronic kidney disease which apparently has been stable most recent creatinine ICU is 1.47 with a BUN of 14 on 07/24/2014.  Also has a history of diet-controlled diabetes type 2 most recent hemoglobin A1c was 5.5 on 07/18/2014.  He is not on antihypertension medicines but apparently his blood pressure control was somewhat borderline-there was consideration for adding an agent at previous facility.  Blood pressure today initially was elevated at 160/98-however once patient was rechecked in a bit more relaxed it came down to 120s over 70s.  He does have a history of hypotension and is  on Midodrine   Initially when I evaluated patient he had just arrived at the facility-I did reevaluate him later in the evening he appeared to be somewhat more relaxed was in his bed appeared comfortable eating his dinner      .  Allergies   Allergen  Reactions   .  Bactrim [Sulfamethoxazole-Trimethoprim]  Other (See Comments)     hallucinations   .  Horse-Derived Products  Hives   MEDICATIONS - reviewed--per MAR-medications include-Synthroid-methotrexate with history of polymyalgia rheumatica-MiraLax-Protonix-Mucinex-tramadol-Midodrine--Plavix-Risperdal albuterol inhaler-dual nebs routinely 4 times a day-Zoloft 100 mg each bedtime  DATA REVIEWED  Radiologic Exams:  04/29/14:MRI of the brain  MPRESSION:  No acute finding. Advanced generalized brain atrophy. Mild chronic  small-vessel changes.  CXR 04/29/14:  Bibasilar atx  Laboratory Studies:  07/24/2014.  Creatinine 1.47-BUN 14.  07/18/2014.  Hemoglobin 11.1-CMP within normal limits except albumin of 3.4.  Creatinine was 1.61.  Hemoglobin A1c 5.5.  07/08/2014.  TSH-2.07  TSH 2.07 on July 15.    Lab Results   Component  Value  Date    WBC  9.0  05/01/2014    HGB  13.2  05/01/2014    HCT  40.6  05/01/2014    MCV  103.3*  05/01/2014    PLT  322  05/01/2014    Lab Results   Component  Value  Date    NA  139  05/01/2014    K  4.2  05/01/2014    BUN  11  05/01/2014    CREATININE  1.25  05/01/2014    Lab Results   Component  Value  Date    NA  139  05/01/2014    K  4.2  05/01/2014    CL  103  05/01/2014    CO2  21  05/01/2014    GLUCOSE  118*  05/01/2014    BUN  11  05/01/2014    CREATININE  1.25  05/01/2014    CALCIUM  8.8  05/01/2014    ALBUMIN  3.4*  04/29/2014    AST  31  04/29/2014    ALT  22  04/29/2014    ALKPHOS  68  04/29/2014    BILITOT  0.5  04/29/2014    GFRNONAA  49*  05/01/2014    GFRAA  57*  05/01/2014    Lab Results   Component  Value  Date    ATFTDDUK02  542  04/29/2014    Past Medical History   Diagnosis  Date   .  CAD  (coronary artery disease)      s/p NSTEMI and BMS stent diagonal07/09; Lexiscan Myoview 9/13: EF 62%, no ischemia   .  AS (aortic stenosis)      a. Echo 2009 mean gradient 37mm HG. AVA 2.18; b. Echo 4/11 mild AS mean gradient 62mm HG; c. Echo 04/2011: Mild LVH, EF 70-62%, grade 1 diastolic dysfunction, mild aortic stenosis, mean gradient 14, mild LAE; d. Echo 9/13: mod LVH, EF 50-55%, mild to mod AS, mean gradient 17 mmHg   .  AF (paroxysmal atrial fibrillation)      Holter monitor 2.5 sec pauses   .  Fatigue      CPX 11/09 VO2 14.4 (75% predicte)d, slope 35. O2 pulse normal. VO2 corrected for body weight 17.6.   Marland Kitchen  Hypothyroidism    .  Hyperlipidemia    .  History of prostate cancer    .  Allergic rhinitis    .  DM (diabetes mellitus)      type II diet controlled   .  Shingles    .  Left carotid stenosis    .  Vitamin B 12 deficiency    .  Anemia    .  Osteoarthritis    .  Nephrolithiasis    .  Obesity    .  OSA (obstructive sleep apnea)      AHI 15 PSG 09/06/08   .  Hx of colonoscopy    .  Stroke    .  Anxiety and depression    .  PMR (polymyalgia rheumatica)  05/29/2012   .  Hypogonadism male  05/29/2012   .  Hyperparathyroidism  05/29/2012   .  TIA (transient ischemic attack)  05/29/2012   .  CKD (chronic kidney disease) stage 4, GFR 15-29 ml/min  05/29/2012   .  COPD (chronic obstructive pulmonary disease)  05/29/2012   .  Myocardial infarction    .  Cancer      prostate ca history    Past Surgical History   Procedure  Laterality  Date   .  Appendectomy     .  Hernia repair     .  Right carpal tunnel release     .  Lumbar spine surgery     .  Cystectomy       neck   .  Prostatectomy     .  Vasectomy     .  Total knee arthroplasty       right   .  Cholecystectomy      No family status information on file.    History    Social History Narrative   .  No narrative on file   REVIEW OF SYSTEMS  DATA OBTAINED: from patient, quite limited since patient is a poor  historian GENERAL: No t fever,   SKIN: No itch, rash.   abrasion to right ear EYES: No eye pain, dryness or itching No change in vision  EARS: No earache, tinnitus, change in hearing  NOSE: No congestion, drainage or bleeding  MOUTH/THROAT: No mouth or tooth pain No sore throat No difficulty chewing or swallowing  RESPIRATORY: Apparently has a chronic cough with history of COPD CARDIAC: No chest pain, palpitations .  GI: No abdominal pain No nausea, vomiting,diarrhea or constipation No heartburn or reflux  GU: No dysuria, frequency or urgency No change in urine volume or character No nocturia or change in stream incontinent  MUSCULOSKELETAL: Is complaining of some leg pain-also right shoulder discomfort with movement. Reports left sided weakness after a CVA. . Frequent falls.  NEUROLOGIC: No dizziness, fainting, headache, numbness apparently has had some progressing confusion with history of dementia  PSYCHIATRIC: hx depression he is on Zoloft   Physical exam.  Temperature is 97.0 pulse 80 respirations 16 blood pressure most recently 124/7   GENERAL APPEARANCE: Pleasant elderly male in no distress he is somewhat hard of hearing-sitting comfortably initially in his wheelchair and I later evaluated him in bed  SKIN: No diaphoresis has an abrasion to his right ear   HEAD: Normocephalic,   EYES: Conjunctiva/lids clear. Pupils round, reactive. EOMs intact there is a possibly have a slight left eye ectropion.  EARS: External exam-abrasion noted to the right outer ear,--he is hard of hearing   NOSE: No deformity or discharge.  MOUTH/THROAT: Lips w/o lesions. Oral mucosa, tongue moist, w/o lesion. Oropharynx w/o redness or lesions.     RESPIRATORY: No labored breathing there is a small amount of expiratory wheezing which is diffuse.  CARDIOVASCULAR: Heart RRR an occasional irregular beat. No murmur or extra heart sounds somewhat distant heart sounds   .  EDEMA: No peripheral edema. No  ascites  GASTROINTESTINAL: Abdomen is soft, non-tender, slightly distended w/ normal bowel sounds.  GENITOURINARY: unable to palpate due to body habitus  MUSCULOSKELETAL: Moves all extremities x4-he is complaining of some pain when he lifts his right arm -- pain in the shoulder area-I did not really note any deformity of the shoulder some slight tenderness to palpation of the area.  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 F  PSYCHIATRIC: Mood and affect appropriate to situation   ASSESSMENT/PLAN  COPD (chronic obstructive pulmonary disease)  At this point appear stable he is on nebulizers 4 times a day-as well as Mucinex and albuterol inhaler -- .  Dementia  Apparently this is progressing  per review of previous notes  He is currently on Risperdal at bedtime. I .  PMR (polymyalgia rheumatica)  Continue methotrexate. Monitor for side effects. Not in pain at this time.  Hip fracture, right  S/p right hemi.--Does not really complain of pain at the hip but is complaining of some leg pain bilaterally-we'll x-ray area especially in light of the fall earlier this morning .  CKD (chronic kidney disease) stage 4, GFR 15-29 ml/min  Apparenlty been relatively stable with a creatinine ranging in the low to mid ones-we'll update metabolic panel.    HYPERTENSION, BENIGN  Not currently on meds. Apparently has variable blood pressures-at this point will monitor appear blood pressure improved later in the day  Depression Currently on Zoloft-will monitor   Status post fall earlier  today-he appears to be stable in this regards again we'll x-ray his legs hips as well as his right shoulder where he's complaining of some discomfort-continue to monitor neurologically he appears to be at baseline.   History of diabetes-apparently this is diet controlled recent hemoglobin A1c was 5.5-will monitor CBGs twice a day  For now   Hypothyroidism-he is on Synthroid Will update TSH   History of coronary artery disease-he is on Plavix.  .  Of note we'll update a CBC CMP and TSH for baseline values.  Addendum-we have obtain the x-ray results of the hips legs and right shoulder-they do not show anything acute-- does show degenerative changes which is not unexpected-- at this point will monitor    [id number]-of note greater than 40 minutes spent assessing patient and reassessing patient later in the day-reviewing his chart-andcoordinating and formulating a plan of care for numerous diagnoses- of note greater than 50% of time spent coordinating plan of care-

## 2014-09-01 NOTE — Progress Notes (Signed)
Patient ID: [name], male   DOB: 15-Mar-1923, 80 y.o.   MRN: [id number]           PROGRESS NOTE  DATE: 08/24/2014          FACILITY:  [facility]  LEVEL OF CARE: SNF (31)  Acute Visit  CHIEF COMPLAINT:  Manage anxiety.    HISTORY OF PRESENT ILLNESS: I was requested by the staff to assess the patient regarding above problem(s):  Staff report that patient is increasingly anxious, especially in the morning.  Patient does admit to anxiety.  He cannot identify precipitating or alleviating factors.  There are no other associated signs and symptoms.     PAST MEDICAL HISTORY : Reviewed.  No changes/see problem list  CURRENT MEDICATIONS: Reviewed per MAR/see medication list  REVIEW OF SYSTEMS:  GENERAL: no change in appetite, no fatigue, no weight changes, no fever, chills or weakness RESPIRATORY: no cough, SOB, DOE,, wheezing, hemoptysis CARDIAC: no chest pain, edema or palpitations GI: no abdominal pain, diarrhea, constipation, heart burn, nausea or vomiting  PHYSICAL EXAMINATION  VS: see VS section  GENERAL: no acute distress, normal body habitus NECK: supple, trachea midline, no neck masses, no thyroid tenderness, no thyromegaly RESPIRATORY: breathing is even & unlabored, BS CTAB CARDIAC: RRR, no murmur,no extra heart sounds, +1 bilateral lower extremity edema     GI: abdomen soft, normal BS, no masses, no tenderness, no hepatomegaly, no splenomegaly PSYCHIATRIC: the patient is alert & oriented to person, affect & behavior appropriate  ASSESSMENT/PLAN:  Anxiety.  New problem.  Start lorazepam 0.5 mg b.i.d. p.r.n.       CPT CODE: 62130           Yigit Norkus Y Kloi Brodman, [place] [phone]

## 2014-09-02 ENCOUNTER — Non-Acute Institutional Stay (SKILLED_NURSING_FACILITY): Payer: Medicare Other | Admitting: Internal Medicine

## 2014-09-02 DIAGNOSIS — F0391 Unspecified dementia with behavioral disturbance: Secondary | ICD-10-CM

## 2014-09-02 DIAGNOSIS — E1129 Type 2 diabetes mellitus with other diabetic kidney complication: Secondary | ICD-10-CM

## 2014-09-02 DIAGNOSIS — I10 Essential (primary) hypertension: Secondary | ICD-10-CM

## 2014-09-02 DIAGNOSIS — N184 Chronic kidney disease, stage 4 (severe): Secondary | ICD-10-CM

## 2014-09-02 DIAGNOSIS — E039 Hypothyroidism, unspecified: Secondary | ICD-10-CM

## 2014-09-02 DIAGNOSIS — I951 Orthostatic hypotension: Secondary | ICD-10-CM

## 2014-09-02 DIAGNOSIS — N039 Chronic nephritic syndrome with unspecified morphologic changes: Secondary | ICD-10-CM

## 2014-09-02 DIAGNOSIS — D631 Anemia in chronic kidney disease: Secondary | ICD-10-CM

## 2014-09-02 DIAGNOSIS — M353 Polymyalgia rheumatica: Secondary | ICD-10-CM

## 2014-09-02 DIAGNOSIS — J449 Chronic obstructive pulmonary disease, unspecified: Secondary | ICD-10-CM

## 2014-09-02 DIAGNOSIS — F03918 Unspecified dementia, unspecified severity, with other behavioral disturbance: Secondary | ICD-10-CM

## 2014-09-02 DIAGNOSIS — I4891 Unspecified atrial fibrillation: Secondary | ICD-10-CM

## 2014-09-02 DIAGNOSIS — I480 Paroxysmal atrial fibrillation: Secondary | ICD-10-CM

## 2014-09-02 NOTE — Assessment & Plan Note (Signed)
From lying to sitting; 128/70 ---77/50; pulse 64--67; Will increase midodrine to 5 mg TID

## 2014-09-02 NOTE — Progress Notes (Signed)
MRN: [id number] Name: [name]  Sex: male Age: 80 y.o. DOB: 02-03-1923  [place] #: [name] Facility/Room: 411P Level Of Care: SNF Provider: Inocencio Homes D Emergency Contacts: Extended Emergency Contact Information Primary Emergency Contact: Pridgeon,Dontai Address: [address]          [place], [place] [address] [name] Phone: [phone] Work Phone: [phone] Mobile Phone: [phone] Relation: Son Secondary Emergency Contact: Brown,Vicky Address: [address] [place]          [place], [place] [address] [place] Phone: [phone] Work Phone: [phone] Mobile Phone: [phone] Relation: Daughter  Code Status:FULL   Allergies: Bactrim and Horse-derived products  Chief Complaint  Patient presents with  . New Admit To SNF    HPI: Patient is 80 y.o. male who is transfering to another SNF.  Past Medical History  Diagnosis Date  . CAD (coronary artery disease)     s/p NSTEMI and BMS stent diagonal07/09;  Lexiscan Myoview 9/13:  EF 62%, no ischemia  . AS (aortic stenosis)     a.  Echo 2009 mean gradient 31mm HG. AVA 2.18;  b.  Echo 4/11 mild AS mean gradient 27mm HG;  c.  Echo 04/2011: Mild LVH, EF 74-82%, grade 1 diastolic dysfunction, mild aortic stenosis, mean gradient 14, mild LAE;  d. Echo 9/13: mod LVH, EF 50-55%, mild to mod AS, mean gradient 17 mmHg  . AF (paroxysmal atrial fibrillation)     Holter monitor 2.5 sec pauses  . Fatigue     CPX 11/09 VO2  14.4 (75% predicte)d, slope 35.  O2 pulse normal.  VO2 corrected for body weight 17.6.  Marland Kitchen Hypothyroidism   . Hyperlipidemia   . History of prostate cancer   . Allergic rhinitis   . DM (diabetes mellitus)     type II diet controlled  . Shingles   . Left carotid stenosis   . Vitamin B 12 deficiency   . Anemia   . Osteoarthritis   . Nephrolithiasis   . Obesity   . OSA (obstructive sleep apnea)     AHI 15 PSG 09/06/08  . Hx of colonoscopy   . Stroke   .  Anxiety and depression   . PMR (polymyalgia rheumatica) 05/29/2012  . Hypogonadism male 05/29/2012  . Hyperparathyroidism 05/29/2012  . TIA (transient ischemic attack) 05/29/2012  . CKD (chronic kidney disease) stage 4, GFR 15-29 ml/min 05/29/2012  . COPD (chronic obstructive pulmonary disease) 05/29/2012  . Myocardial infarction   . Cancer     prostate ca history    Past Surgical History  Procedure Laterality Date  . Appendectomy    . Hernia repair    . Right carpal tunnel release    . Lumbar spine surgery    . Cystectomy      neck  . Prostatectomy    . Vasectomy    . Total knee arthroplasty      right  . Cholecystectomy        Medication List       This list is accurate as of: 09/02/14 11:59 PM.  Always use your most recent med list.               buPROPion 300 MG 24 hr tablet  Commonly known as:  WELLBUTRIN XL  Take 300 mg by mouth daily.     clopidogrel 75 MG tablet  Commonly known as:  PLAVIX  Take 75 mg by mouth every evening.  folic acid 1 MG tablet  Commonly known as:  FOLVITE  Take 1 mg by mouth every morning.     GERITOL COMPLETE PO  Take 1 tablet by mouth every morning.     guaiFENesin 600 MG 12 hr tablet  Commonly known as:  MUCINEX  Take by mouth 2 (two) times daily.     HYDROcodone-acetaminophen 10-325 MG per tablet  Commonly known as:  NORCO  Take one tablet by mouth every 4 hours as needed for pain     ipratropium-albuterol 0.5-2.5 (3) MG/3ML Soln  Commonly known as:  DUONEB  Take 3 mLs by nebulization 4 (four) times daily.     levothyroxine 137 MCG tablet  Commonly known as:  SYNTHROID, LEVOTHROID  Take 137 mcg by mouth daily before breakfast.     methotrexate 2.5 MG tablet  Commonly known as:  RHEUMATREX  Take 10 mg by mouth once a week. Takes 4 tablets weekly on Wednesday.   Caution:Chemotherapy. Protect from light.     midodrine 2.5 MG tablet  Commonly known as:  PROAMATINE  Take 1 tablet (2.5 mg total) by mouth 3 (three) times daily  with meals.     omeprazole 40 MG capsule  Commonly known as:  PRILOSEC  Take 40 mg by mouth daily.     polyethylene glycol packet  Commonly known as:  MIRALAX / GLYCOLAX  Take 17 g by mouth daily.     RISPERDAL PO  Take by mouth.     sertraline 100 MG tablet  Commonly known as:  ZOLOFT  Take 100 mg by mouth at bedtime.     traMADol 50 MG tablet  Commonly known as:  ULTRAM  Take one tablet by mouth twice daily for pain     vitamin C 500 MG tablet  Commonly known as:  ASCORBIC ACID  Take 500 mg by mouth at bedtime.        Meds ordered this encounter  Medications  . buPROPion (WELLBUTRIN XL) 300 MG 24 hr tablet    Sig: Take 300 mg by mouth daily.  Marland Kitchen omeprazole (PRILOSEC) 40 MG capsule    Sig: Take 40 mg by mouth daily.  Marland Kitchen guaiFENesin (MUCINEX) 600 MG 12 hr tablet    Sig: Take by mouth 2 (two) times daily.    Immunization History  Administered Date(s) Administered  . Influenza Split 09/03/2012  . Influenza,inj,Quad PF,36+ Mos 09/23/2013  . Influenza-Unspecified 10/05/2011  . Pneumococcal Conjugate-13 04/25/2011, 09/23/2013  . Tdap 04/24/2012    History  Substance Use Topics  . Smoking status: Former Smoker -- 0.50 packs/day for 25 years    Types: Cigarettes    Quit date: 12/25/1958  . Smokeless tobacco: Former Systems developer    Types: Snuff, [name]    Quit date: 08/06/1970     Comment: quit in 1950s  . Alcohol Use: No    Family history is noncontributory    Review of Systems  DATA OBTAINED: from patient, poor historian GENERAL: Feels well no fevers, fatigue, appetite changes SKIN: No itching, rash or wounds EYES: No eye pain, redness, discharge EARS: No earache, tinnitus, change in hearing NOSE: No congestion, drainage or bleeding  MOUTH/THROAT: No mouth or tooth pain, No sore throat  RESPIRATORY: No cough, wheezing, SOB CARDIAC: No chest pain, palpitations, lower extremity edema  GI: No abdominal pain, No N/V/D or constipation, No heartburn or reflux  GU: No  dysuria, frequency or urgency, or incontinence  MUSCULOSKELETAL: No unrelieved bone/joint pain NEUROLOGIC: No headache, dizziness or  focal weakness PSYCHIATRIC: No overt anxiety or sadness   Filed Vitals:   09/02/14 1418  BP: 118/80  Pulse: 80  Temp: 98.6 F (37 C)  Resp: 18    Physical Exam  GENERAL APPEARANCE: Alert, conversant. Appropriately groomed. No acute distress.  SKIN: No diaphoresis rash HEAD: Normocephalic, atraumatic  EYES: Conjunctiva/lids clear. Pupils round, reactive. EOMs intact.  EARS: External exam WNL, canals clear. Hearing grossly normal.  NOSE: No deformity or discharge.  MOUTH/THROAT: Lips w/o lesions.   RESPIRATORY: Breathing is even, unlabored. Lung sounds are clear   CARDIOVASCULAR: Heart RRR no murmurs, rubs or gallops. No peripheral edema.  GASTROINTESTINAL: Abdomen is soft, non-tender, not distended w/ normal bowel sounds GENITOURINARY: Bladder non tender, not distended  MUSCULOSKELETAL: No abnormal joints or musculature NEUROLOGIC:  Cranial nerves 2-12 grossly intact. Moves all extremities PSYCHIATRIC: dementia, no behavioral issues  Patient Active Problem List   Diagnosis Date Noted  . Type II or unspecified type diabetes mellitus with renal manifestations, not stated as uncontrolled 07/23/2014  . Anemia in chronic kidney disease(285.21) 07/08/2014  . Acute posthemorrhagic anemia 07/07/2014  . Hip fracture, right 06/23/2014  . Skin tear of upper arm without complication 10/93/2355  . Unspecified constipation 06/23/2014  . Cough 06/17/2014  . Coronary atherosclerosis of native coronary artery 06/03/2014  . Macrocytosis 05/13/2014  . Conjunctivitis unspecified 05/07/2014  . Orthostatic hypotension 05/02/2014  . Sinusitis 05/02/2014  . Acute encephalopathy 05/02/2014  . Lethargy 04/29/2014  . Failure to thrive 04/29/2014  . HCAP (healthcare-associated pneumonia) 03/12/2014  . Acute respiratory failure with hypoxia 03/12/2014  . PMR  (polymyalgia rheumatica) 03/12/2014  . Dementia with behavioral disturbance 01/09/2014  . Insomnia 09/23/2013  . Hypogonadism male 05/29/2012  . Hyperparathyroidism 05/29/2012  . CKD (chronic kidney disease) stage 4, GFR 15-29 ml/min 05/29/2012  . COPD (chronic obstructive pulmonary disease) 05/29/2012  . OSA (obstructive sleep apnea) 05/29/2012  . Chronic steroid use 05/29/2012  . Depression   . AS (aortic stenosis)   . AF (paroxysmal atrial fibrillation)   . HYPERTENSION, BENIGN 04/07/2010  . RECTAL BLEEDING 07/21/2008  . PROSTATE CANCER, HX OF 06/20/2008  . VITAMIN B12 DEFICIENCY 06/17/2008  . BACK PAIN 06/15/2008  . HYPOTHYROIDISM 04/21/2008  . HYPERLIPIDEMIA 04/21/2008  . ANEMIA 04/21/2008    CBC    Component Value Date/Time   WBC 9.0 05/01/2014 0426   RBC 3.93* 05/01/2014 0426   RBC 3.06* 06/11/2008 2010   HGB 13.2 05/01/2014 0426   HCT 40.6 05/01/2014 0426   PLT 322 05/01/2014 0426   MCV 103.3* 05/01/2014 0426   LYMPHSABS 1.7 04/29/2014 0834   MONOABS 1.0 04/29/2014 0834   EOSABS 0.3 04/29/2014 0834   BASOSABS 0.0 04/29/2014 0834    CMP     Component Value Date/Time   NA 139 05/01/2014 0426   K 4.2 05/01/2014 0426   CL 103 05/01/2014 0426   CO2 21 05/01/2014 0426   GLUCOSE 118* 05/01/2014 0426   BUN 11 05/01/2014 0426   CREATININE 1.25 05/01/2014 0426   CALCIUM 8.8 05/01/2014 0426   PROT 6.4 04/29/2014 0834   ALBUMIN 3.4* 04/29/2014 0834   AST 31 04/29/2014 0834   ALT 22 04/29/2014 0834   ALKPHOS 68 04/29/2014 0834   BILITOT 0.5 04/29/2014 0834   GFRNONAA 49* 05/01/2014 0426   GFRAA 57* 05/01/2014 0426    Assessment and Plan  Orthostatic hypotension From lying to sitting; 128/70 ---77/50; pulse 64--67; Will increase midodrine to 5 mg TID  AF (paroxysmal atrial fibrillation) On  no meds;on no anticoag 2/2 fall risk  HYPERTENSION, BENIGN On no meds and 2/2 orthostatic hypotension prob won't be on any  COPD (chronic obstructive pulmonary disease) Continue duoneb and  muccinex  HYPOTHYROIDISM TSH 2.07 in 06/2014 on synthroid 137 mcg  Type II or unspecified type diabetes mellitus with renal manifestations, not stated as uncontrolled A1c 5.8 on no meds  Dementia with behavioral disturbance Continue wellbutrin, zoloft and risperdol  Anemia in chronic kidney disease(285.21) Borderline macrocytic anemia with low normal B12 and taking folate  CKD (chronic kidney disease) stage 4, GFR 15-29 ml/min Cr 1.25 and GFR 49  PMR (polymyalgia rheumatica) On methotrexate    Hennie Duos, MD

## 2014-09-06 ENCOUNTER — Encounter: Payer: Self-pay | Admitting: Internal Medicine

## 2014-09-06 NOTE — Assessment & Plan Note (Signed)
Cr 1.25 and GFR 49

## 2014-09-06 NOTE — Assessment & Plan Note (Signed)
On methotrexate

## 2014-09-06 NOTE — Assessment & Plan Note (Signed)
TSH 2.07 in 06/2014 on synthroid 137 mcg

## 2014-09-06 NOTE — Assessment & Plan Note (Signed)
On no meds and 2/2 orthostatic hypotension prob won't be on any

## 2014-09-06 NOTE — Assessment & Plan Note (Signed)
A1c 5.8 on no meds

## 2014-09-06 NOTE — Assessment & Plan Note (Signed)
Continue wellbutrin, zoloft and risperdol

## 2014-09-06 NOTE — Assessment & Plan Note (Signed)
On no meds;on no anticoag 2/2 fall risk

## 2014-09-06 NOTE — Assessment & Plan Note (Signed)
Continue duoneb and muccinex

## 2014-09-06 NOTE — Assessment & Plan Note (Signed)
Borderline macrocytic anemia with low normal B12 and taking folate

## 2014-09-14 ENCOUNTER — Non-Acute Institutional Stay (SKILLED_NURSING_FACILITY): Payer: Medicare Other | Admitting: Internal Medicine

## 2014-09-14 ENCOUNTER — Encounter: Payer: Self-pay | Admitting: Internal Medicine

## 2014-09-14 DIAGNOSIS — N184 Chronic kidney disease, stage 4 (severe): Secondary | ICD-10-CM

## 2014-09-14 DIAGNOSIS — E039 Hypothyroidism, unspecified: Secondary | ICD-10-CM

## 2014-09-14 DIAGNOSIS — N039 Chronic nephritic syndrome with unspecified morphologic changes: Secondary | ICD-10-CM

## 2014-09-14 DIAGNOSIS — D631 Anemia in chronic kidney disease: Secondary | ICD-10-CM

## 2014-09-14 DIAGNOSIS — H109 Unspecified conjunctivitis: Secondary | ICD-10-CM

## 2014-09-14 DIAGNOSIS — I951 Orthostatic hypotension: Secondary | ICD-10-CM

## 2014-09-14 NOTE — Progress Notes (Signed)
Patient ID: [name], male   DOB: 11/08/1923, 80 y.o.   MRN: [id number]   this is an acute visit.  Level of care skilled.  Facility AF.  Chief complaint-acute visit secondary to suspected conjunctivitis--old hypertension hypotension        HPI: Patient is 49 -year-old male who apparently has developed some erythema and drainage of his eyes-apparently has complained of the eyes itching to nursing staff  He also has a history of hypertension however he has significant orthostatic hypotension and is not on any blood pressure medication--In fact his midodrine was recently increased Per chart review appears recent blood pressure 157/73-187/93-181/91 I beleive these were taken lying I did take it manually this evening and got 158/94.  [name]  Past Medical History   Diagnosis  Date   .  CAD (coronary artery disease)      s/p NSTEMI and BMS stent diagonal07/09; Lexiscan Myoview 9/13: EF 62%, no ischemia   .  AS (aortic stenosis)      a. Echo 2009 mean gradient 1mm HG. AVA 2.18; b. Echo 4/11 mild AS mean gradient 72mm HG; c. Echo 04/2011: Mild LVH, EF 32-20%, grade 1 diastolic dysfunction, mild aortic stenosis, mean gradient 14, mild LAE; d. Echo 9/13: mod LVH, EF 50-55%, mild to mod AS, mean gradient 17 mmHg   .  AF (paroxysmal atrial fibrillation)      Holter monitor 2.5 sec pauses   .  Fatigue      CPX 11/09 VO2 14.4 (75% predicte)d, slope 35. O2 pulse normal. VO2 corrected for body weight 17.6.   [name]  Hypothyroidism    .  Hyperlipidemia    .  History of prostate cancer    .  Allergic rhinitis    .  DM (diabetes mellitus)      type II diet controlled   .  Shingles    .  Left carotid stenosis    .  Vitamin B 12 deficiency    .  Anemia    .  Osteoarthritis    .  Nephrolithiasis    .  Obesity    .  OSA (obstructive sleep apnea)      AHI 15 PSG 09/06/08   .  Hx of colonoscopy    .  Stroke    .  Anxiety and depression    .  PMR (polymyalgia rheumatica)  05/29/2012   .  Hypogonadism  male  05/29/2012   .  Hyperparathyroidism  05/29/2012   .  TIA (transient ischemic attack)  05/29/2012   .  CKD (chronic kidney disease) stage 4, GFR 15-29 ml/min  05/29/2012   .  COPD (chronic obstructive pulmonary disease)  05/29/2012   .  Myocardial infarction    .  Cancer      prostate ca history    Past Surgical History   Procedure  Laterality  Date   .  Appendectomy     .  Hernia repair     .  Right carpal tunnel release     .  Lumbar spine surgery     .  Cystectomy       neck   .  Prostatectomy     .  Vasectomy     .  Total knee arthroplasty       right   .  Cholecystectomy        Medication List  buPROPion 300 MG 24 hr tablet    Commonly known as: WELLBUTRIN XL    Take 300 mg by mouth daily.    clopidogrel 75 MG tablet    Commonly known as: PLAVIX    Take 75 mg by mouth every evening.    folic acid 1 MG tablet    Commonly known as: FOLVITE    Take 1 mg by mouth every morning.    GERITOL COMPLETE PO    Take 1 tablet by mouth every morning.    guaiFENesin 600 MG 12 hr tablet    Commonly known as: MUCINEX    Take by mouth 2 (two) times daily.    HYDROcodone-acetaminophen 10-325 MG per tablet    Commonly known as: NORCO    Take one tablet by mouth every 4 hours as needed for pain    ipratropium-albuterol 0.5-2.5 (3) MG/3ML Soln    Commonly known as: DUONEB    Take 3 mLs by nebulization 4 (four) times daily.    levothyroxine 137 MCG tablet    Commonly known as: SYNTHROID, LEVOTHROID    Take 137 mcg by mouth daily before breakfast.    methotrexate 2.5 MG tablet    Commonly known as: RHEUMATREX    Take 10 mg by mouth once a week. Takes 4 tablets weekly on Wednesday. Caution:Chemotherapy. Protect from light.    midodrine 2.5 MG tablet    Commonly known as: PROAMATINE    Take 1 tablet (2.5 mg total) by mouth 3 (three) times daily with meals.    omeprazole 40 MG capsule    Commonly known as: PRILOSEC    Take 40 mg by mouth daily.    polyethylene  glycol packet    Commonly known as: MIRALAX / GLYCOLAX    Take 17 g by mouth daily.    RISPERDAL PO    Take by mouth.    sertraline 100 MG tablet    Commonly known as: ZOLOFT    Take 100 mg by mouth at bedtime.    traMADol 50 MG tablet    Commonly known as: ULTRAM    Take one tablet by mouth twice daily for pain    vitamin C 500 MG tablet    Commonly known as: ASCORBIC ACID    Take 500 mg by mouth at bedtime.      Meds ordered this encounter   Medications   .  buPROPion (WELLBUTRIN XL) 300 MG 24 hr tablet     Sig: Take 300 mg by mouth daily.   [name]  omeprazole (PRILOSEC) 40 MG capsule     Sig: Take 40 mg by mouth daily.   [name]  guaiFENesin (MUCINEX) 600 MG 12 hr tablet     Sig: Take by mouth 2 (two) times daily.    Immunization History   Administered  Date(s) Administered   .  Influenza Split  09/03/2012   .  Influenza,inj,Quad PF,36+ Mos  09/23/2013   .  Influenza-Unspecified  10/05/2011   .  Pneumococcal Conjugate-13  04/25/2011, 09/23/2013   .  Tdap  04/24/2012    History   Substance Use Topics   .  Smoking status:  Former Smoker -- 0.50 packs/day for 25 years     Types:  Cigarettes     Quit date:  12/25/1958   .  Smokeless tobacco:  Former Systems developer     Types:  Snuff, [name]     Quit date:  08/06/1970      Comment: quit in 1950s   .  Alcohol Use:  No   Family history is noncontributory  Review of Systems  DATA OBTAINED: from patient, poor historian  GENERAL: Feels well no fevers, fatigue, appetite changes  SKIN: No itching, rash or wounds  EYES: No eye pain  has, redness,and  discharge  EARS: No earache, tinnitus, change in hearing  NOSE: No congestion, drainage or bleeding  MOUTH/THROAT: No mouth or tooth pain, No sore throat  RESPIRATORY: No cough, wheezing, SOB  CARDIAC: No chest pain, palpitations, lower extremity edema  GI: No abdominal pain, No N/V/D or constipation, No heartburn or reflux  GU: No dysuria, frequency or urgency, or incontinence  MUSCULOSKELETAL: No  unrelieved bone/joint pain  NEUROLOGIC: No headache, dizziness or focal weakness  PSYCHIATRIC: No overt anxiety or sadness                    Physical Exam  Temperature 97.7 pulse 64 respirations 20 blood pressure 158/94 GENERAL APPEARANCE: Alert, conversant. Appropriately groomed. No acute distress.  SKIN: No diaphoresis rash  HEAD: Normocephalic, atraumatic  EYES: Has erythematous conjunctivae-there is some slight exudate noted at the lower lid margins. Pupils round, reactive. EOMs intact.  EARS: External exam WNL, canals clear. Hearing grossly normal.  NOSE: No deformity or discharge.  MOUTH/THROAT: Lips w/o lesions.  RESPIRATORY: Breathing is even, unlabored. Lung sounds are clear  CARDIOVASCULAR: Heart RRR no murmurs, rubs or gallops. trace peripheral edema.  GASTROINTESTINAL: Abdomen is soft, non-tender, not distended w/ normal bowel sounds   MUSCULOSKELETAL: No abnormal joints or musculature  NEUROLOGIC: Cranial nerves 2-12 grossly intact. Moves all extremities  PSYCHIATRIC: dementia, no behavioral issues  Patient Active Problem List    Diagnosis  Date Noted   .  Type II or unspecified type diabetes mellitus with renal manifestations, not stated as uncontrolled  07/23/2014   .  Anemia in chronic kidney disease(285.21)  07/08/2014   .  Acute posthemorrhagic anemia  07/07/2014   .  Hip fracture, right  06/23/2014   .  Skin tear of upper arm without complication  16/09/9603   .  Unspecified constipation  06/23/2014   .  Cough  06/17/2014   .  Coronary atherosclerosis of native coronary artery  06/03/2014   .  Macrocytosis  05/13/2014   .  Conjunctivitis unspecified  05/07/2014   .  Orthostatic hypotension  05/02/2014   .  Sinusitis  05/02/2014   .  Acute encephalopathy  05/02/2014   .  Lethargy  04/29/2014   .  Failure to thrive  04/29/2014   .  HCAP (healthcare-associated pneumonia)  03/12/2014   .  Acute respiratory failure with hypoxia  03/12/2014   .  PMR  (polymyalgia rheumatica)  03/12/2014   .  Dementia with behavioral disturbance  01/09/2014   .  Insomnia  09/23/2013   .  Hypogonadism male  05/29/2012   .  Hyperparathyroidism  05/29/2012   .  CKD (chronic kidney disease) stage 4, GFR 15-29 ml/min  05/29/2012   .  COPD (chronic obstructive pulmonary disease)  05/29/2012   .  OSA (obstructive sleep apnea)  05/29/2012   .  Chronic steroid use  05/29/2012   .  Depression    .  AS (aortic stenosis)    .  AF (paroxysmal atrial fibrillation)    .  HYPERTENSION, BENIGN  04/07/2010   .  RECTAL BLEEDING  07/21/2008   .  PROSTATE CANCER, HX OF  06/20/2008   .  VITAMIN B12 DEFICIENCY  06/17/2008   .  BACK PAIN  06/15/2008   .  HYPOTHYROIDISM  04/21/2008   .  HYPERLIPIDEMIA  04/21/2008   .  ANEMIA  04/21/2008   CBC    Component  Value  Date/Time    WBC  9.0  05/01/2014 0426    RBC  3.93*  05/01/2014 0426    RBC  3.06*  06/11/2008 2010    HGB  13.2  05/01/2014 0426    HCT  40.6  05/01/2014 0426    PLT  322  05/01/2014 0426    MCV  103.3*  05/01/2014 0426    LYMPHSABS  1.7  04/29/2014 0834    MONOABS  1.0  04/29/2014 0834    EOSABS  0.3  04/29/2014 0834    BASOSABS  0.0  04/29/2014 0834   CMP    Component  Value  Date/Time    NA  139  05/01/2014 0426    K  4.2  05/01/2014 0426    CL  103  05/01/2014 0426    CO2  21  05/01/2014 0426    GLUCOSE  118*  05/01/2014 0426    BUN  11  05/01/2014 0426    CREATININE  1.25  05/01/2014 0426    CALCIUM  8.8  05/01/2014 0426    PROT  6.4  04/29/2014 0834    ALBUMIN  3.4*  04/29/2014 0834    AST  31  04/29/2014 0834    ALT  22  04/29/2014 0834    ALKPHOS  68  04/29/2014 0834    BILITOT  0.5  04/29/2014 0834    GFRNONAA  49*  05/01/2014 0426    GFRAA  57*  05/01/2014 0426   Assessment and Plan  Conjunctivitis-will treat with Cipro ophthalmology solution 2 drops 3 times a day x7 days and monitor  Orthostatic hypotension this point will monitor-he has variable blood pressures-goal is somewhat loose control  of hypertension secondary to his  orthostatic issues--if systolics consistently significant elevated we'll have to readdress AF (paroxysmal atrial fibrillation)  On no meds;on no anticoag 2/2 fall risk  HYPERTENSION, BENIGN  On no meds and 2/2 orthostatic hypotension prob won't be on any  COPD (chronic obstructive pulmonary disease)  Continue duoneb and muccinex  HYPOTHYROIDISM  TSH 2.07 in 06/2014 on synthroid 137 mcg we'll update TSH Type II or unspecified type diabetes mellitus with renal manifestations, not stated as uncontrolled  A1c 5.8 on no meds  Dementia with behavioral disturbance  Continue wellbutrin, zoloft and risperdol  Anemia in chronic kidney disease(285.21)  Borderline macrocytic anemia with low normal B12 and taking folate --we'll update CBC CKD (chronic kidney disease) stage 4, GFR 15-29 ml/min  Cr 1.25 and GFR 49 --we'll update BMP PMR (polymyalgia rheumatica)  On methotrexate   [id number]--

## 2014-09-20 ENCOUNTER — Encounter: Payer: Self-pay | Admitting: Internal Medicine

## 2014-10-03 ENCOUNTER — Encounter (HOSPITAL_COMMUNITY): Payer: Self-pay | Admitting: Emergency Medicine

## 2014-10-03 ENCOUNTER — Emergency Department (HOSPITAL_COMMUNITY): Payer: Medicare Other

## 2014-10-03 ENCOUNTER — Emergency Department (HOSPITAL_COMMUNITY)
Admission: EM | Admit: 2014-10-03 | Discharge: 2014-10-03 | Disposition: A | Discharge status: Home / Self Care | Payer: Medicare Other | Attending: Emergency Medicine | Admitting: Emergency Medicine

## 2014-10-03 DIAGNOSIS — Z7901 Long term (current) use of anticoagulants: Secondary | ICD-10-CM | POA: Diagnosis not present

## 2014-10-03 DIAGNOSIS — D649 Anemia, unspecified: Secondary | ICD-10-CM | POA: Insufficient documentation

## 2014-10-03 DIAGNOSIS — Z8546 Personal history of malignant neoplasm of prostate: Secondary | ICD-10-CM | POA: Diagnosis not present

## 2014-10-03 DIAGNOSIS — E039 Hypothyroidism, unspecified: Secondary | ICD-10-CM | POA: Diagnosis not present

## 2014-10-03 DIAGNOSIS — I252 Old myocardial infarction: Secondary | ICD-10-CM | POA: Insufficient documentation

## 2014-10-03 DIAGNOSIS — I2510 Atherosclerotic heart disease of native coronary artery without angina pectoris: Secondary | ICD-10-CM | POA: Diagnosis not present

## 2014-10-03 DIAGNOSIS — Y9389 Activity, other specified: Secondary | ICD-10-CM | POA: Diagnosis not present

## 2014-10-03 DIAGNOSIS — Z87891 Personal history of nicotine dependence: Secondary | ICD-10-CM | POA: Diagnosis not present

## 2014-10-03 DIAGNOSIS — M1990 Unspecified osteoarthritis, unspecified site: Secondary | ICD-10-CM | POA: Insufficient documentation

## 2014-10-03 DIAGNOSIS — E669 Obesity, unspecified: Secondary | ICD-10-CM | POA: Insufficient documentation

## 2014-10-03 DIAGNOSIS — E785 Hyperlipidemia, unspecified: Secondary | ICD-10-CM | POA: Insufficient documentation

## 2014-10-03 DIAGNOSIS — J449 Chronic obstructive pulmonary disease, unspecified: Secondary | ICD-10-CM | POA: Insufficient documentation

## 2014-10-03 DIAGNOSIS — W19XXXA Unspecified fall, initial encounter: Secondary | ICD-10-CM

## 2014-10-03 DIAGNOSIS — E119 Type 2 diabetes mellitus without complications: Secondary | ICD-10-CM | POA: Insufficient documentation

## 2014-10-03 DIAGNOSIS — Z87442 Personal history of urinary calculi: Secondary | ICD-10-CM | POA: Diagnosis not present

## 2014-10-03 DIAGNOSIS — Y929 Unspecified place or not applicable: Secondary | ICD-10-CM | POA: Insufficient documentation

## 2014-10-03 DIAGNOSIS — Z8673 Personal history of transient ischemic attack (TIA), and cerebral infarction without residual deficits: Secondary | ICD-10-CM | POA: Insufficient documentation

## 2014-10-03 DIAGNOSIS — Z8619 Personal history of other infectious and parasitic diseases: Secondary | ICD-10-CM | POA: Insufficient documentation

## 2014-10-03 DIAGNOSIS — Z79899 Other long term (current) drug therapy: Secondary | ICD-10-CM | POA: Insufficient documentation

## 2014-10-03 DIAGNOSIS — S0990XA Unspecified injury of head, initial encounter: Secondary | ICD-10-CM | POA: Diagnosis present

## 2014-10-03 DIAGNOSIS — S0181XA Laceration without foreign body of other part of head, initial encounter: Secondary | ICD-10-CM | POA: Insufficient documentation

## 2014-10-03 DIAGNOSIS — E213 Hyperparathyroidism, unspecified: Secondary | ICD-10-CM | POA: Insufficient documentation

## 2014-10-03 NOTE — ED Provider Notes (Signed)
Level V caveat dementia Patient reportedly fell from his wheelchair striking his face this afternoon. On exam patient is sleepy arousable to verbal stimulus. Follow simple commands, moves all extremities HEENT exam there is a laceration at the forehead ,perpedicular to natural lines of forehead , , right-sided periorbital ecchymosis no active bleeding neck no step-off no point tenderness lungs clear good breath sounds abdomen nondistended nontender. Bilateral lower cervix with abrasions at knees. No soft tissue swelling no deformity neurovascular intact neurologic does not answer questions cranial nerves II through XII intact moves all extremities well, follows commands  Orlie Dakin, MD 10/03/14 1906

## 2014-10-03 NOTE — Discharge Instructions (Signed)
Return here as needed.  Sutures out in 7 days.  Followup with his primary care Dr.

## 2014-10-03 NOTE — ED Notes (Signed)
Pt from [facility], pt had witnessed fall forward out of wheelchair while reaching out to get something approx 1715. Lac above R eye, uncontrolled bleeding. Pt on Plavix. Per NH staff to EMS, pt acting and speaking per his norm.

## 2014-10-03 NOTE — ED Notes (Signed)
Wound dressing has been applied with assistance from [place], [place]

## 2014-10-03 NOTE — ED Notes (Signed)
Bed: WA06 Expected date:  Expected time:  Means of arrival:  Comments: fall

## 2014-10-03 NOTE — ED Provider Notes (Signed)
CSN: [id number]     Arrival date & time 10/03/14  30 History   First MD Initiated Contact with Patient 10/03/14 1750     Chief Complaint  Patient presents with  . Head Laceration     (Consider location/radiation/quality/duration/timing/severity/associated sxs/prior Treatment) HPI Patient presents to the emergency department with a fall that occurred just prior to arrival.  The nursing staff witnessed the fall, saying he was attempting to reach for something was sitting in his wheelchair and fell forward, striking his head.  Patient is a laceration just above the right patient is unable to give me any history due to dementia.  Family also, states the patient has frequent falls. Past Medical History  Diagnosis Date  . CAD (coronary artery disease)     s/p NSTEMI and BMS stent diagonal07/09;  Lexiscan Myoview 9/13:  EF 62%, no ischemia  . AS (aortic stenosis)     a.  Echo 2009 mean gradient 29mm HG. AVA 2.18;  b.  Echo 4/11 mild AS mean gradient 29mm HG;  c.  Echo 04/2011: Mild LVH, EF 40-98%, grade 1 diastolic dysfunction, mild aortic stenosis, mean gradient 14, mild LAE;  d. Echo 9/13: mod LVH, EF 50-55%, mild to mod AS, mean gradient 17 mmHg  . AF (paroxysmal atrial fibrillation)     Holter monitor 2.5 sec pauses  . Fatigue     CPX 11/09 VO2  14.4 (75% predicte)d, slope 35.  O2 pulse normal.  VO2 corrected for body weight 17.6.  Marland Kitchen Hypothyroidism   . Hyperlipidemia   . History of prostate cancer   . Allergic rhinitis   . DM (diabetes mellitus)     type II diet controlled  . Shingles   . Left carotid stenosis   . Vitamin B 12 deficiency   . Anemia   . Osteoarthritis   . Nephrolithiasis   . Obesity   . OSA (obstructive sleep apnea)     AHI 15 PSG 09/06/08  . Hx of colonoscopy   . Stroke   . Anxiety and depression   . PMR (polymyalgia rheumatica) 05/29/2012  . Hypogonadism male 05/29/2012  . Hyperparathyroidism 05/29/2012  . TIA (transient ischemic attack) 05/29/2012  . CKD (chronic  kidney disease) stage 4, GFR 15-29 ml/min 05/29/2012  . COPD (chronic obstructive pulmonary disease) 05/29/2012  . Myocardial infarction   . Cancer     prostate ca history   Past Surgical History  Procedure Laterality Date  . Appendectomy    . Hernia repair    . Right carpal tunnel release    . Lumbar spine surgery    . Cystectomy      neck  . Prostatectomy    . Vasectomy    . Total knee arthroplasty      right  . Cholecystectomy     Family History  Problem Relation Age of Onset  . Stroke Mother 11  . Coronary artery disease      father, brother, sister  . Alcohol abuse Father 8   History  Substance Use Topics  . Smoking status: Former Smoker -- 0.50 packs/day for 25 years    Types: Cigarettes    Quit date: 12/25/1958  . Smokeless tobacco: Former Systems developer    Types: Snuff, [name]    Quit date: 08/06/1970     Comment: quit in 1950s  . Alcohol Use: No    Review of Systems Level V caveat applies due to dementia   Allergies  Bactrim and Horse-derived products  Home Medications  Prior to Admission medications   Medication Sig Start Date End Date Taking? Authorizing Provider  albuterol (PROVENTIL HFA;VENTOLIN HFA) 108 (90 BASE) MCG/ACT inhaler Inhale 2 puffs into the lungs every 6 (six) hours as needed for wheezing or shortness of breath.   Yes Historical Provider, MD  buPROPion (WELLBUTRIN XL) 300 MG 24 hr tablet Take 300 mg by mouth every morning.    Yes Historical Provider, MD  clopidogrel (PLAVIX) 75 MG tablet Take 75 mg by mouth every evening.    Yes Historical Provider, MD  folic acid (FOLVITE) 1 MG tablet Take 1 mg by mouth every morning.    Yes Historical Provider, MD  guaiFENesin (MUCINEX) 600 MG 12 hr tablet Take by mouth 2 (two) times daily.   Yes Historical Provider, MD  HYDROcodone-acetaminophen (NORCO) 10-325 MG per tablet Take 1 tablet by mouth every 4 (four) hours as needed for severe pain.   Yes Historical Provider, MD  ipratropium-albuterol (DUONEB) 0.5-2.5  (3) MG/3ML SOLN Take 3 mLs by nebulization 4 (four) times daily.   Yes Historical Provider, MD  levothyroxine (SYNTHROID, LEVOTHROID) 137 MCG tablet Take 137 mcg by mouth every evening. Before dinner   Yes Historical Provider, MD  methotrexate (RHEUMATREX) 2.5 MG tablet Take 10 mg by mouth once a week. Takes 4 tablets weekly on Wednesday.   Caution:Chemotherapy. Protect from light.   Yes Historical Provider, MD  Multiple Vitamin (MULTIVITAMIN WITH MINERALS) TABS tablet Take 1 tablet by mouth every morning.   Yes Historical Provider, MD  pantoprazole (PROTONIX) 40 MG tablet Take 40 mg by mouth every morning.   Yes Historical Provider, MD  polyethylene glycol (MIRALAX / GLYCOLAX) packet Take 17 g by mouth daily.   Yes Historical Provider, MD  Propylene Glycol (SYSTANE BALANCE OP) Apply 2 drops to eye 2 (two) times daily.   Yes Historical Provider, MD  risperiDONE (RISPERDAL) 1 MG tablet Take 1 mg by mouth every evening. Give daily at 5pm   Yes Historical Provider, MD  sertraline (ZOLOFT) 100 MG tablet Take 100 mg by mouth at bedtime.   Yes Historical Provider, MD  traMADol (ULTRAM) 50 MG tablet Take 50 mg by mouth 2 (two) times daily.   Yes Historical Provider, MD  vitamin C (ASCORBIC ACID) 500 MG tablet Take 500 mg by mouth at bedtime.   Yes Historical Provider, MD   BP 169/82  Pulse 79  Temp(Src) 98 F (36.7 C) (Oral)  Resp 18  SpO2 100% Physical Exam  Nursing note and vitals reviewed. Constitutional: He appears well-developed and well-nourished. No distress.  HENT:  Head: Normocephalic.    Mouth/Throat: Oropharynx is clear and moist.  Eyes: Pupils are equal, round, and reactive to light.  Neck: Normal range of motion. Neck supple.  Cardiovascular: Normal rate, regular rhythm and normal heart sounds.  Exam reveals no gallop and no friction rub.   No murmur heard. Pulmonary/Chest: Effort normal and breath sounds normal.  Neurological: He is alert. He exhibits normal muscle tone.  Coordination normal.  Skin: Skin is warm and dry.    ED Course  Procedures (including critical care time) Labs Review Labs Reviewed - No data to display  Imaging Review Ct Head Wo Contrast  10/03/2014   ADDENDUM REPORT: 10/03/2014 19:06  ADDENDUM: Note on the cervical spine CT in the body of the report it should state: Stable 3 mm anterior subluxation C 4 on C5, most likely degenerative. No evidence of acute fracture.   Electronically Signed   By: Marcello Moores  Register  On: 10/03/2014 19:06   10/03/2014   CLINICAL DATA:  Fall out of wheelchair. Acute injury. Laceration over right eye. Plavix therapy.  EXAM: CT HEAD WITHOUT CONTRAST  CT CERVICAL SPINE WITHOUT CONTRAST  TECHNIQUE: Multidetector CT imaging of the head and cervical spine was performed following the standard protocol without intravenous contrast. Multiplanar CT image reconstructions of the cervical spine were also generated.  COMPARISON:  CT 06/19/2014, 04/29/2014, 03/12/2014.  FINDINGS: CT HEAD FINDINGS  No intra or extra-axial pathologic fluid or blood collection. No mass. No hydrocephalus. Diffuse cerebral and cerebellar atrophy. Old lacunar infarcts. White matter changes consistent with chronic ischemia nodular density in the subcutaneous region right frontal scalp. This could represent a small hematoma appears similar finding noted on prior exam.  . Right periorbital mild soft tissue swelling is present. The globes are intact. No bony abnormality. No evidence of fracture. Paranasal sinuses are clear.  CT CERVICAL SPINE FINDINGS  Diffuse degenerative changes cervical spine. No evidence of fracture or dislocation. Bilateral apical pleural parenchymal thickening and noted consistent with scarring. Stable 3 mm NSAID tear subluxation C pleural C5 present. This is most likely degenerative. No soft tissue swelling heads suggest acute injury.  IMPRESSION: 1. No acute intracranial abnormality. Mild soft tissue swelling noted right frontal lobe  right periorbital region. No fracture. 2. Severe cervical spine degenerative change. Stable 3 mm anterior subluxation C4 on C5, most likely degenerative. No acute fracture or dislocation.  Electronically Signed: ByMarcello Moores  Register On: 10/03/2014 18:50   Ct Cervical Spine Wo Contrast  10/03/2014   ADDENDUM REPORT: 10/03/2014 19:06  ADDENDUM: Note on the cervical spine CT in the body of the report it should state: Stable 3 mm anterior subluxation C 4 on C5, most likely degenerative. No evidence of acute fracture.   Electronically Signed   By: Marcello Moores  Register   On: 10/03/2014 19:06   10/03/2014   CLINICAL DATA:  Fall out of wheelchair. Acute injury. Laceration over right eye. Plavix therapy.  EXAM: CT HEAD WITHOUT CONTRAST  CT CERVICAL SPINE WITHOUT CONTRAST  TECHNIQUE: Multidetector CT imaging of the head and cervical spine was performed following the standard protocol without intravenous contrast. Multiplanar CT image reconstructions of the cervical spine were also generated.  COMPARISON:  CT 06/19/2014, 04/29/2014, 03/12/2014.  FINDINGS: CT HEAD FINDINGS  No intra or extra-axial pathologic fluid or blood collection. No mass. No hydrocephalus. Diffuse cerebral and cerebellar atrophy. Old lacunar infarcts. White matter changes consistent with chronic ischemia nodular density in the subcutaneous region right frontal scalp. This could represent a small hematoma appears similar finding noted on prior exam.  . Right periorbital mild soft tissue swelling is present. The globes are intact. No bony abnormality. No evidence of fracture. Paranasal sinuses are clear.  CT CERVICAL SPINE FINDINGS  Diffuse degenerative changes cervical spine. No evidence of fracture or dislocation. Bilateral apical pleural parenchymal thickening and noted consistent with scarring. Stable 3 mm NSAID tear subluxation C pleural C5 present. This is most likely degenerative. No soft tissue swelling heads suggest acute injury.  IMPRESSION: 1.  No acute intracranial abnormality. Mild soft tissue swelling noted right frontal lobe right periorbital region. No fracture. 2. Severe cervical spine degenerative change. Stable 3 mm anterior subluxation C4 on C5, most likely degenerative. No acute fracture or dislocation.  Electronically Signed: By: Marcello Moores  Register On: 10/03/2014 18:50   LACERATION REPAIR Performed by: Brent General Authorized by: Brent General Consent: Verbal consent obtained. Risks and benefits: risks, benefits and alternatives were discussed  Consent given by: patient Patient identity confirmed: provided demographic data Prepped and Draped in normal sterile fashion Wound explored  Laceration Location: Right forehead  Laceration Length: 4 cm  No Foreign Bodies seen or palpated  Anesthesia: local infiltration  Local anesthetic: lidocaine 2 % without epinephrine  Anesthetic total: 6 ml  Irrigation method: syringe Amount of cleaning: standard  Skin closure: 5-0 Prolene   Number of sutures: 12   Technique: Simple, interrupted   Patient tolerance: Patient tolerated the procedure well with no immediate complications.    Patient will be sent back to the nursing facility and does have the sutures out in 7 days.  Told to followup with his primary care Dr. family agrees to the plan and all questions were answered  Brent General, PA-C 10/03/14 2048

## 2014-10-03 NOTE — ED Notes (Signed)
[facility] called from transport.

## 2014-10-04 NOTE — ED Provider Notes (Signed)
Medical screening examination/treatment/procedure(s) were conducted as a shared visit with non-physician practitioner(s) and myself.  I personally evaluated the patient during the encounter.   EKG Interpretation None       Orlie Dakin, MD 10/04/14 [id number]

## 2014-10-05 ENCOUNTER — Non-Acute Institutional Stay (SKILLED_NURSING_FACILITY): Payer: Medicare Other | Admitting: Internal Medicine

## 2014-10-05 ENCOUNTER — Encounter: Payer: Self-pay | Admitting: Internal Medicine

## 2014-10-05 DIAGNOSIS — H109 Unspecified conjunctivitis: Secondary | ICD-10-CM

## 2014-10-05 DIAGNOSIS — T148 Other injury of unspecified body region: Secondary | ICD-10-CM

## 2014-10-05 DIAGNOSIS — IMO0002 Reserved for concepts with insufficient information to code with codable children: Secondary | ICD-10-CM

## 2014-10-05 NOTE — Progress Notes (Signed)
Patient ID: [name], male   DOB: 01-Sep-1923, 80 y.o.   MRN: [id number]   This is an acute visit.  Level of care skilled.  Facility AF.  Chief complaint-.  Acute visit status post fall with head laceration-followup conjunctivitis.  History of present illness.  Patient is an elderly resident who was recently admitted to this facility from another facility-he has a history of atrial fibrillation as well as static hypotension COPD diabetes and dementia.  Apparently over the weekend he fell out of his bed and sustained a laceration to his right forehead that required stitches in the ER.  Workup in the ER including a CT scan of his head and cervical spine apparently did not show any acute changes it did show mild soft tissue swelling right frontal lobe periorbital region but no fracture-cervical spine studies did not show any acute fracture dislocation it did show degenerative changes.  Today he is resting comfortably in bed he does not have any acute complaints other than his eyes feel somewhat irritated.  He did last month receive a course of Cipro ophthalmologic solution for suspected conjunctivitis-according to nursing staff they thought this helped but now it has returned--  Family medical social history as been reviewed per admission note on 09/02/2014.  Medications have been reviewed per MAR.  Review of systems.  Somewhat limited secondary to dementia.  .  In general no complaints of fever or chills.  Skin does not complaining of rashes or itching again does have a laceration to the right forehead.  Eyes complains of his eyes feeling somewhat irritated some history of conjunctivitis does not complaining of visual changes.  Respiratory not complaining of shortness of breath or cough does have a history of COPD.  Cardiac no chest pain minimal lower extremity edema.  GI does not complaining of any nausea vomiting diarrhea or abdominal discomfort.  Muscle skeletal is  not really complaining of joint pain other than some weakness.  Neurologic is not complaining of any dizziness or headache.  Psych he does have a history of dementia this appears relatively baseline.  Physical exam.  Temperature 98.1 pulse 79 respirations 19 blood pressure 128/76.  In general this is a pleasant elderly male in no distress lying comfortably in bed.  The skin is warm and dry laceration site right upper forehead sutures are in place I do not see significant surrounding erythema or drainage from the area this looks relatively benign at this time.  Eyes he does have some grayish-colored exudate from his lower eyelids bilaterally conjunctivae is mildly erythematous pupils are reactive visual acuity appears intact.  Oropharynx clear mucous membranes moist.  Chest is clear to auscultation with somewhat reduced breath sounds no labored breathing.  Heart is regular rate and rhythm without murmur gallop or rub he has trace lower extremity edema.  Abdomen somewhat obese soft nontender with positive bowel sounds.  Musculoskeletal moves all extremities x4 at baseline has some lower extremity weakness but this is not new-I do not note any deformities grip strength is strong bilaterally.  Neurologic is intact no lateralizing findings speech is clear.  Psych he does have history of dementia has significant confusion at times but is pleasant and appropriate.  Labs.  09/21/2014.  Sodium 140 potassium 4.1 BUN 19 creatinine 1.2.  Previously 8.8 hemoglobin 9.7 platelets 327.  Assessment and plan.  #1-fall with right forehead laceration-he did receive sutures in the ER with orders to remove sutures in 7 days-at this point appears to be  stable-continue to monitor.  #2-history of conjunctivitis-we'll restart the Cipro ophthalmologic solution 2 drops 3 times a day for 10 days-all right in order to culture any drainage for a little more insight on this.  [id number] note greater  than 25 minutes spent assessing patient-discussing his status with nursing staff-reviewing hospital records-and coordinating and formulating a plan of care-of note greater than 50% of time spent coordinating plan of care

## 2014-11-02 ENCOUNTER — Encounter: Payer: Self-pay | Admitting: Internal Medicine

## 2014-11-02 ENCOUNTER — Non-Acute Institutional Stay (SKILLED_NURSING_FACILITY): Payer: Medicare Other | Admitting: Internal Medicine

## 2014-11-02 DIAGNOSIS — F0391 Unspecified dementia with behavioral disturbance: Secondary | ICD-10-CM

## 2014-11-02 DIAGNOSIS — I951 Orthostatic hypotension: Secondary | ICD-10-CM

## 2014-11-02 DIAGNOSIS — E038 Other specified hypothyroidism: Secondary | ICD-10-CM

## 2014-11-02 DIAGNOSIS — F03918 Unspecified dementia, unspecified severity, with other behavioral disturbance: Secondary | ICD-10-CM

## 2014-11-02 DIAGNOSIS — I480 Paroxysmal atrial fibrillation: Secondary | ICD-10-CM

## 2014-11-02 DIAGNOSIS — N184 Chronic kidney disease, stage 4 (severe): Secondary | ICD-10-CM

## 2014-11-02 DIAGNOSIS — H109 Unspecified conjunctivitis: Secondary | ICD-10-CM

## 2014-11-02 DIAGNOSIS — J42 Unspecified chronic bronchitis: Secondary | ICD-10-CM

## 2014-11-02 NOTE — Progress Notes (Signed)
Patient ID: [name], male   DOB: [birth year], 80 y.o.   MRN: [id number]   This is a routine visit.  Level care skilled.   [place].  Chief complaint-medical management of chronic medical conditions including dementia-COPD-hypertension-hypothyroidism-conjunctivitis        HPI: Patient is 55 -year-old malewho was recently admitted here from another facility-his stay has been fairly unremarkable he has multiple medical conditions but these have been relatively stable.  He does have recurring conjunctivitis and appears this has occured again according in nursing staff gentamicin is more effective than Cipro when given topically f  He also has a history of hypertension however he has significant orthostatic hypotension and is not on any blood pressure medication--In fact his midodrine was  increasedat one point Per chart review appears recent blood pressures 155/82-146/77-I did take it manually and got 160/96 today Ipatient has no acute complaints today  .  Past Medical History   Diagnosis  Date   .  CAD (coronary artery disease)      s/p NSTEMI and BMS stent diagonal07/09; Lexiscan Myoview 9/13: EF 62%, no ischemia   .  AS (aortic stenosis)      a. Echo 2009 mean gradient 58mm HG. AVA 2.18; b. Echo 4/11 mild AS mean gradient 42mm HG; c. Echo 04/2011: Mild LVH, EF 20-94%, grade 1 diastolic dysfunction, mild aortic stenosis, mean gradient 14, mild LAE; d. Echo 9/13: mod LVH, EF 50-55%, mild to mod AS, mean gradient 17 mmHg   .  AF (paroxysmal atrial fibrillation)      Holter monitor 2.5 sec pauses   .  Fatigue      CPX 11/09 VO2 14.4 (75% predicte)d, slope 35. O2 pulse normal. VO2 corrected for body weight 17.6.   Marland Kitchen  Hypothyroidism    .  Hyperlipidemia    .  History of prostate cancer    .  Allergic rhinitis    .  DM (diabetes mellitus)      type II diet controlled   .  Shingles    .  Left carotid stenosis    .   Vitamin B 12 deficiency    .  Anemia    .  Osteoarthritis    .  Nephrolithiasis    .  Obesity    .  OSA (obstructive sleep apnea)      AHI 15 PSG 09/06/08   .  Hx of colonoscopy    .  Stroke    .  Anxiety and depression    .  PMR (polymyalgia rheumatica)  05/29/2012   .  Hypogonadism male  05/29/2012   .  Hyperparathyroidism  05/29/2012   .  TIA (transient ischemic attack)  05/29/2012   .  CKD (chronic kidney disease) stage 4, GFR 15-29 ml/min  05/29/2012   .  COPD (chronic obstructive pulmonary disease)  05/29/2012   .  Myocardial infarction    .  Cancer      prostate ca history    Past Surgical History   Procedure  Laterality  Date   .  Appendectomy     .  Hernia repair     .  Right carpal tunnel release     .  Lumbar spine surgery     .  Cystectomy       neck   .  Prostatectomy     .  Vasectomy     .  Total knee arthroplasty       right   .  Cholecystectomy        Medication List                    buPROPion 300 MG 24 hr tablet    Commonly known as: WELLBUTRIN XL    Take 300 mg by mouth daily.    clopidogrel 75 MG tablet    Commonly known as: PLAVIX    Take 75 mg by mouth every evening.    folic acid 1 MG tablet    Commonly known as: FOLVITE    Take 1 mg by mouth every morning.    GERITOL COMPLETE PO    Take 1 tablet by mouth every morning.    guaiFENesin 600 MG 12 hr tablet    Commonly known as: MUCINEX    Take by mouth 2 (two) times daily.    HYDROcodone-acetaminophen 10-325 MG per tablet    Commonly known as: NORCO    Take one tablet by mouth every 4 hours as needed for pain    ipratropium-albuterol 0.5-2.5 (3) MG/3ML Soln    Commonly known as: DUONEB    Take 3 mLs by nebulization 4 (four) times daily.    levothyroxine 137 MCG tablet    Commonly known as: SYNTHROID, LEVOTHROID    Take 137 mcg by mouth daily before  breakfast.    methotrexate 2.5 MG tablet    Commonly known as: RHEUMATREX    Take 10 mg by mouth once a week. Takes 4 tablets weekly on Wednesday. Caution:Chemotherapy. Protect from light.    midodrine 2.5 MG tablet    Commonly known as: PROAMATINE    Take 1 tablet (2.5 mg total) by mouth 3 (three) times daily with meals.    omeprazole 40 MG capsule    Commonly known as: PRILOSEC    Take 40 mg by mouth daily.    polyethylene glycol packet    Commonly known as: MIRALAX / GLYCOLAX    Take 17 g by mouth daily.    RISPERDAL PO    Take by mouth.    sertraline 100 MG tablet    Commonly known as: ZOLOFT    Take 100 mg by mouth at bedtime.    traMADol 50 MG tablet    Commonly known as: ULTRAM    Take one tablet by mouth twice daily for pain    vitamin C 500 MG tablet    Commonly known as: ASCORBIC ACID    Take 500 mg by mouth at bedtime.      Meds ordered this encounter   Medications   .  buPROPion (WELLBUTRIN XL) 300 MG 24 hr tablet     Sig: Take 300 mg by mouth daily.   Marland Kitchen  omeprazole (PRILOSEC) 40 MG capsule     Sig: Take 40 mg by mouth daily.   Marland Kitchen  guaiFENesin (MUCINEX) 600 MG 12 hr tablet     Sig: Take by mouth 2 (two) times daily.    Immunization History   Administered  Date(s) Administered   .  Influenza Split  09/03/2012   .  Influenza,inj,Quad PF,36+ Mos  09/23/2013   .  Influenza-Unspecified  10/05/2011   .  Pneumococcal Conjugate-13  04/25/2011, 09/23/2013   .  Tdap  04/24/2012    History   Substance Use Topics   .  Smoking status:  Former Smoker -- 0.50 packs/day for 25 years     Types:  Cigarettes     Quit date:  12/25/1958   .  Smokeless  tobacco:  Former Systems developer     Types:  Snuff, [name]     Quit date:  08/06/1970      Comment: quit in 1950s   .  Alcohol Use:  No   Family history is noncontributory  Review of Systems  DATA OBTAINED: from patient, poor  historian  GENERAL: Feels well no fevers, fatigue, appetite changes  SKIN: No itching, rash or wounds  EYES: No eye pain has, redness,and discharge  EARS: No earache, tinnitus, change in hearing  NOSE: No congestion, drainage or bleeding  MOUTH/THROAT: No mouth or tooth pain, No sore throat  RESPIRATORY: No cough, wheezing, SOB  CARDIAC: No chest pain, palpitations, lower extremity edema  GI: No abdominal pain, No N/V/D or constipation, No heartburn or reflux  GU: No dysuria, frequency or urgency, or incontinence  MUSCULOSKELETAL: No unrelieved bone/joint pain  NEUROLOGIC: No headache, dizziness or focal weakness  PSYCHIATRIC: No overt anxiety or sadness                    Physical Exam Dr. 96.8 pulse 75 respirations 20 blood pressure taken manually 160/96 GENERAL APPEARANCE: Alert, conversant. Appropriately groomed. No acute distress.  SKIN: No diaphoresis rash  HEAD: Normocephalic, atraumatic  EYES: Has erythematous conjunctivae-there is some slight exudate noted at the lower lid margins. Pupils round, reactive. EOMs intact.  EARS: External exam WNL, canals clear. Hearing grossly normal.  NOSE: No deformity or discharge.  MOUTH/THROAT: Lips w/o lesions.  RESPIRATORY: Breathing is even, unlabored. Lung sounds are clear  CARDIOVASCULAR: Heart RRR no murmurs, rubs or gallops. trace peripheral edema.  GASTROINTESTINAL: Abdomen is soft, non-tender, not distended w/ normal bowel sounds  MUSCULOSKELETAL: No abnormal joints or musculature  NEUROLOGIC: Cranial nerves 2-12 grossly intact. Moves all extremities  PSYCHIATRIC: dementia, no behavioral issues  Patient Active Problem List    Diagnosis  Date Noted   .  Type II or unspecified type diabetes mellitus with renal manifestations, not stated as uncontrolled  07/23/2014   .  Anemia in chronic kidney disease(285.21)  07/08/2014   .  Acute posthemorrhagic anemia  07/07/2014   .   Hip fracture, right  06/23/2014   .  Skin tear of upper arm without complication  29/92/4268   .  Unspecified constipation  06/23/2014   .  Cough  06/17/2014   .  Coronary atherosclerosis of native coronary artery  06/03/2014   .  Macrocytosis  05/13/2014   .  Conjunctivitis unspecified  05/07/2014   .  Orthostatic hypotension  05/02/2014   .  Sinusitis  05/02/2014   .  Acute encephalopathy  05/02/2014   .  Lethargy  04/29/2014   .  Failure to thrive  04/29/2014   .  HCAP (healthcare-associated pneumonia)  03/12/2014   .  Acute respiratory failure with hypoxia  03/12/2014   .  PMR (polymyalgia rheumatica)  03/12/2014   .  Dementia with behavioral disturbance  01/09/2014   .  Insomnia  09/23/2013   .  Hypogonadism male  05/29/2012   .  Hyperparathyroidism  05/29/2012   .  CKD (chronic kidney disease) stage 4, GFR 15-29 ml/min  05/29/2012   .  COPD (chronic obstructive pulmonary disease)  05/29/2012   .  OSA (obstructive sleep apnea)  05/29/2012   .  Chronic steroid use  05/29/2012   .  Depression    .  AS (aortic stenosis)    .  AF (paroxysmal atrial fibrillation)    .  HYPERTENSION,  BENIGN  04/07/2010   .  RECTAL BLEEDING  07/21/2008   .  PROSTATE CANCER, HX OF  06/20/2008   .  VITAMIN B12 DEFICIENCY  06/17/2008   .  BACK PAIN  06/15/2008   .  HYPOTHYROIDISM  04/21/2008   .  HYPERLIPIDEMIA  04/21/2008   .  ANEMIA  04/21/2008   CBC  09/28/2014.  T4-0.9. TSH9.27.  09/21/2014.  Sodium 140 potassium 4.1 BUN 19 creatinine 1.2.  WBC 8.8 hemoglobin 9.7 platelets 327.  09/03/2014.  Albumin 3.1 otherwise liver function tests within normal limits    Component  Value  Date/Time    WBC  9.0  05/01/2014 0426    RBC  3.93*  05/01/2014 0426    RBC  3.06*  06/11/2008 2010    HGB  13.2  05/01/2014 0426    HCT  40.6  05/01/2014 0426    PLT  322  05/01/2014 0426     MCV  103.3*  05/01/2014 0426    LYMPHSABS  1.7  04/29/2014 0834    MONOABS  1.0  04/29/2014 0834    EOSABS  0.3  04/29/2014 0834    BASOSABS  0.0  04/29/2014 0834   CMP    Component  Value  Date/Time    NA  139  05/01/2014 0426    K  4.2  05/01/2014 0426    CL  103  05/01/2014 0426    CO2  21  05/01/2014 0426    GLUCOSE  118*  05/01/2014 0426    BUN  11  05/01/2014 0426    CREATININE  1.25  05/01/2014 0426    CALCIUM  8.8  05/01/2014 0426    PROT  6.4  04/29/2014 0834    ALBUMIN  3.4*  04/29/2014 0834    AST  31  04/29/2014 0834    ALT  22  04/29/2014 0834    ALKPHOS  68  04/29/2014 0834    BILITOT  0.5  04/29/2014 0834    GFRNONAA  49*  05/01/2014 0426    GFRAA  57*  05/01/2014 0426   Assessment and Plan  Conjunctivitis-will treat withwill restart gentamicin eyedrops as previously ordered by Dr. Sheppard Coil apparently this was quite effective  Orthostatic hypotension this point will monitor-he has variable blood pressures-goal is somewhat loose control of hypertension secondary to his orthostatic issues--if systolics consistently significant elevated we'll have to readdress AF (paroxysmal atrial fibrillation)  On no meds;on no anticoag 2/2 fall risk  HYPERTENSION, BENIGN  On no meds and 2/2 orthostatic hypotension prob won't be on any  COPD (chronic obstructive pulmonary disease)  Continue duoneb and muccinex this appears to be stable HYPOTHYROIDISM   on synthroid 137 mcg we'll update TSH Type II or unspecified type diabetes mellitus with renal manifestations, not stated as uncontrolled  A1c 5.8 on no meds--Will update hemoglobin A1c  Dementia with behavioral disturbance  Continue wellbutrin, zoloft and risperdol  Anemia in chronic kidney disease(285.21)  Borderline macrocytic anemia with low normal B12 and taking folate --we'll update CBC CKD (chronic kidney disease) stage 4, GFR 15-29 ml/min  Cr 1.75most  recent lab --we'll update BMP PMR (polymyalgia rheumatica)  On methotrexate   Of note will update lab work again including a basic metabolic panel TSH hemoglobin A1c as well as CBC for updated values  [id number] \\\\

## 2014-12-31 ENCOUNTER — Encounter: Payer: Self-pay | Admitting: Internal Medicine

## 2014-12-31 ENCOUNTER — Non-Acute Institutional Stay (SKILLED_NURSING_FACILITY): Payer: Medicare Other | Admitting: Internal Medicine

## 2014-12-31 DIAGNOSIS — N189 Chronic kidney disease, unspecified: Secondary | ICD-10-CM

## 2014-12-31 DIAGNOSIS — N184 Chronic kidney disease, stage 4 (severe): Secondary | ICD-10-CM

## 2014-12-31 DIAGNOSIS — I480 Paroxysmal atrial fibrillation: Secondary | ICD-10-CM

## 2014-12-31 DIAGNOSIS — F03918 Unspecified dementia, unspecified severity, with other behavioral disturbance: Secondary | ICD-10-CM

## 2014-12-31 DIAGNOSIS — M353 Polymyalgia rheumatica: Secondary | ICD-10-CM

## 2014-12-31 DIAGNOSIS — F0391 Unspecified dementia with behavioral disturbance: Secondary | ICD-10-CM

## 2014-12-31 DIAGNOSIS — J42 Unspecified chronic bronchitis: Secondary | ICD-10-CM

## 2014-12-31 DIAGNOSIS — E038 Other specified hypothyroidism: Secondary | ICD-10-CM

## 2014-12-31 DIAGNOSIS — D631 Anemia in chronic kidney disease: Secondary | ICD-10-CM

## 2014-12-31 NOTE — Progress Notes (Signed)
Patient ID: [name], male   DOB: [birth year], 81 y.o.   MRN: [id number]   This is a routine visit  Level care skilled.   [place].  Chief complaint-medical management of chronic medical conditions including dementia-COPD-hypertension-hypothyroidism-conjunctivitis        HPI: Patient is 77 -year-old malewho was  admitted here from another facility-his stay has been fairly unremarkable he has multiple medical conditions but these have been relatively stable.  He does have recurring conjunctivitis and according in nursing staff gentamicin is more effective than Cipro when given topicallyi--he continues on this f  He also has a history of hypertension however he has significant orthostatic hypotension and is not on any blood pressure medication--In fact his midodrine was  increasedat one point Per chart review appears recent blood pressures continue to be variable most recently 98/61-again at times she does have some elevated systolics patient has no acute complaints today--per nursing at timesshe is somewhat lethargic but then he perks up again  this appears to be quite variable  .  Past Medical History   Diagnosis  Date   .  CAD (coronary artery disease)      s/p NSTEMI and BMS stent diagonal07/09; Lexiscan Myoview 9/13: EF 62%, no ischemia   .  AS (aortic stenosis)      a. Echo 2009 mean gradient 69mm HG. AVA 2.18; b. Echo 4/11 mild AS mean gradient 42mm HG; c. Echo 04/2011: Mild LVH, EF 27-06%, grade 1 diastolic dysfunction, mild aortic stenosis, mean gradient 14, mild LAE; d. Echo 9/13: mod LVH, EF 50-55%, mild to mod AS, mean gradient 17 mmHg   .  AF (paroxysmal atrial fibrillation)      Holter monitor 2.5 sec pauses   .  Fatigue      CPX 11/09 VO2 14.4 (75% predicte)d, slope 35. O2 pulse normal. VO2 corrected for body weight 17.6.   Marland Kitchen  Hypothyroidism    .  Hyperlipidemia    .  History of prostate cancer    .  Allergic  rhinitis    .  DM (diabetes mellitus)      type II diet controlled   .  Shingles    .  Left carotid stenosis    .  Vitamin B 12 deficiency    .  Anemia    .  Osteoarthritis    .  Nephrolithiasis    .  Obesity    .  OSA (obstructive sleep apnea)      AHI 15 PSG 09/06/08   .  Hx of colonoscopy    .  Stroke    .  Anxiety and depression    .  PMR (polymyalgia rheumatica)  05/29/2012   .  Hypogonadism male  05/29/2012   .  Hyperparathyroidism  05/29/2012   .  TIA (transient ischemic attack)  05/29/2012   .  CKD (chronic kidney disease) stage 4, GFR 15-29 ml/min  05/29/2012   .  COPD (chronic obstructive pulmonary disease)  05/29/2012   .  Myocardial infarction    .  Cancer      prostate ca history    Past Surgical History   Procedure  Laterality  Date   .  Appendectomy     .  Hernia repair     .  Right carpal tunnel release     .  Lumbar spine surgery     .  Cystectomy       neck   .  Prostatectomy     .  Vasectomy     .  Total knee arthroplasty       right   .  Cholecystectomy        Medication List                    buPROPion 300 MG 24 hr tablet    Commonly known as: WELLBUTRIN XL    Take 300 mg by mouth daily.    clopidogrel 75 MG tablet    Commonly known as: PLAVIX    Take 75 mg by mouth every evening.    folic acid 1 MG tablet    Commonly known as: FOLVITE    Take 1 mg by mouth every morning.    GERITOL COMPLETE PO    Take 1 tablet by mouth every morning.    guaiFENesin 600 MG 12 hr tablet    Commonly known as: MUCINEX    Take by mouth 2 (two) times daily.    HYDROcodone-acetaminophen 10-325 MG per tablet    Commonly known as: NORCO    Take one tablet by mouth every 4 hours as needed for pain    ipratropium-albuterol 0.5-2.5 (3) MG/3ML Soln    Commonly known as: DUONEB    Take 3 mLs by nebulization 4 (four)  times daily.    levothyroxine 137 MCG tablet    Commonly known as: SYNTHROID, LEVOTHROID    Take 137 mcg by mouth daily before breakfast.    methotrexate 2.5 MG tablet    Commonly known as: RHEUMATREX    Take 10 mg by mouth once a week. Takes 4 tablets weekly on Wednesday. Caution:Chemotherapy. Protect from light.    midodrine 2.5 MG tablet    Commonly known as: PROAMATINE    Take 1 tablet (2.5 mg total) by mouth 3 (three) times daily with meals.    omeprazole 40 MG capsule    Commonly known as: PRILOSEC    Take 40 mg by mouth daily.    polyethylene glycol packet    Commonly known as: MIRALAX / GLYCOLAX    Take 17 g by mouth daily.    RISPERDAL PO    Take by mouth.    sertraline 100 MG tablet    Commonly known as: ZOLOFT    Take 100 mg by mouth at bedtime.    traMADol 50 MG tablet    Commonly known as: ULTRAM    Take one tablet by mouth twice daily for pain    vitamin C 500 MG tablet    Commonly known as: ASCORBIC ACID    Take 500 mg by mouth at bedtime.      Meds ordered this encounter   Medications   .  buPROPion (WELLBUTRIN XL) 300 MG 24 hr tablet     Sig: Take 300 mg by mouth daily.   Marland Kitchen  omeprazole (PRILOSEC) 40 MG capsule     Sig: Take 40 mg by mouth daily.   Marland Kitchen  guaiFENesin (MUCINEX) 600 MG 12 hr tablet     Sig: Take by mouth 2 (two) times daily.    Immunization History   Administered  Date(s) Administered   .  Influenza Split  09/03/2012   .  Influenza,inj,Quad PF,36+ Mos  09/23/2013   .  Influenza-Unspecified  10/05/2011   .  Pneumococcal Conjugate-13  04/25/2011, 09/23/2013   .  Tdap  04/24/2012    History   Substance Use Topics   .  Smoking status:  Former Smoker -- 0.50 packs/day for 25  years     Types:  Cigarettes     Quit date:  12/25/1958   .  Smokeless tobacco:  Former Systems developer     Types:  Snuff, [name]     Quit date:  08/06/1970      Comment: quit in  1950s   .  Alcohol Use:  No   Family history is noncontributory  Review of Systems  DATA OBTAINED: from patient, poor historian  GENERAL:  no fevers, fatigue, appetite changes  SKIN: No itching, rash or wounds  EYES: No eye pain has, redness,and discharge-at times  EARS: No earache, tinnitus, change in hearing  NOSE: No congestion, drainage or bleeding  MOUTH/THROAT: No mouth or tooth pain, No sore throat  RESPIRATORY: No cough, wheezing, SOB  CARDIAC: No chest pain, palpitations, lower extremity edema  GI: No abdominal pain, No N/V/D or constipation, No heartburn or reflux  GU: No dysuria, frequency or urgency, or incontinence  MUSCULOSKELETAL: No unrelieved bone/joint pain  NEUROLOGIC: No headache, dizziness or focal weakness  PSYCHIATRIC: No overt anxiety or sadness                    Physical Exam Temperature 98.7 pulse 73 respirations 16 blood pressure 98/61 GENERAL APPEARANCE: Alert, conversant. Appropriately groomed. No acute distress.  SKIN: No diaphoresis rash  HEAD: Normocephalic, atraumatic  EYES: Has  slightly erythematous conjunctivae-there is minimal exudate noted at the lower lid margins. Pupils round, reactive. EOMs intact.  EARS: External exam WNL, canals clear. Hearing grossly normal.  NOSE: No deformity or discharge.  MOUTH/THROAT: Lips w/o lesions.  RESPIRATORY: Breathing is even, unlabored. Lung sounds are clear  CARDIOVASCULAR: Heart RRR no murmurs, rubs or gallops. trace peripheral edema.  GASTROINTESTINAL: Abdomen is soft, non-tender, not distended w/ normal bowel sounds  MUSCULOSKELETAL: No abnormal joints or musculature  NEUROLOGIC: Cranial nerves 2-12 grossly intact. Moves all extremities  PSYCHIATRIC: dementia, no behavioral issues --appears baseline Patient Active Problem List    Diagnosis  Date Noted   .  Type II or unspecified type diabetes mellitus with renal manifestations, not stated as  uncontrolled  07/23/2014   .  Anemia in chronic kidney disease(285.21)  07/08/2014   .  Acute posthemorrhagic anemia  07/07/2014   .  Hip fracture, right  06/23/2014   .  Skin tear of upper arm without complication  02/54/2706   .  Unspecified constipation  06/23/2014   .  Cough  06/17/2014   .  Coronary atherosclerosis of native coronary artery  06/03/2014   .  Macrocytosis  05/13/2014   .  Conjunctivitis unspecified  05/07/2014   .  Orthostatic hypotension  05/02/2014   .  Sinusitis  05/02/2014   .  Acute encephalopathy  05/02/2014   .  Lethargy  04/29/2014   .  Failure to thrive  04/29/2014   .  HCAP (healthcare-associated pneumonia)  03/12/2014   .  Acute respiratory failure with hypoxia  03/12/2014   .  PMR (polymyalgia rheumatica)  03/12/2014   .  Dementia with behavioral disturbance  01/09/2014   .  Insomnia  09/23/2013   .  Hypogonadism male  05/29/2012   .  Hyperparathyroidism  05/29/2012   .  CKD (chronic kidney disease) stage 4, GFR 15-29 ml/min  05/29/2012   .  COPD (chronic obstructive pulmonary disease)  05/29/2012   .  OSA (obstructive sleep apnea)  05/29/2012   .  Chronic steroid use  05/29/2012   .  Depression    .  AS (aortic stenosis)    .  AF (paroxysmal atrial fibrillation)    .  HYPERTENSION, BENIGN  04/07/2010   .  RECTAL BLEEDING  07/21/2008   .  PROSTATE CANCER, HX OF  06/20/2008   .  VITAMIN B12 DEFICIENCY  06/17/2008   .  BACK PAIN  06/15/2008   .  HYPOTHYROIDISM  04/21/2008   .  HYPERLIPIDEMIA  04/21/2008   .  ANEMIA  04/21/2008   Labs.  12/24/2014.  Urinalysis negative.  11/03/2014.  Sodium 138 potassium 4 BUN 15 creatinine 1.2.  WBC 8.5 hemoglobin 10.6 platelets 331.  Hemoglobin A1c 5.5.  TSH-8.17  09/28/2014.  T4-0.9. TSH9.27.  09/21/2014.  Sodium 140 potassium 4.1 BUN 19 creatinine 1.2.  WBC 8.8 hemoglobin 9.7  platelets 327.  09/03/2014.  Albumin 3.1 otherwise liver function tests within normal limits    Component  Value  Date/Time    WBC  9.0  05/01/2014 0426    RBC  3.93*  05/01/2014 0426    RBC  3.06*  06/11/2008 2010    HGB  13.2  05/01/2014 0426    HCT  40.6  05/01/2014 0426    PLT  322  05/01/2014 0426    MCV  103.3*  05/01/2014 0426    LYMPHSABS  1.7  04/29/2014 0834    MONOABS  1.0  04/29/2014 0834    EOSABS  0.3  04/29/2014 0834    BASOSABS  0.0  04/29/2014 0834   CMP    Component  Value  Date/Time    NA  139  05/01/2014 0426    K  4.2  05/01/2014 0426    CL  103  05/01/2014 0426    CO2  21  05/01/2014 0426    GLUCOSE  118*  05/01/2014 0426    BUN  11  05/01/2014 0426    CREATININE  1.25  05/01/2014 0426    CALCIUM  8.8  05/01/2014 0426    PROT  6.4  04/29/2014 0834    ALBUMIN  3.4*  04/29/2014 0834    AST  31  04/29/2014 0834    ALT  22  04/29/2014 0834    ALKPHOS  68  04/29/2014 0834    BILITOT  0.5  04/29/2014 0834    GFRNONAA  49*  05/01/2014 0426    GFRAA  57*  05/01/2014 0426   Assessment and Plan  Conjunctivitis-apparently this has improved with gentamicin he continues on this chronically continue to monitor  Orthostatic hypotension this point will monitor-he has variable blood pressures-goal is somewhat loose control of hypertension secondary to his orthostatic issues-- AF (paroxysmal atrial fibrillation)  On no meds;on no anticoag 2/2 fall risk  HYPERTENSION, BENIGN  On no meds and 2/2 orthostatic hypotension prob won't be on any  COPD (chronic obstructive pulmonary disease)  Continue duoneb and muccinex this appears to be stable HYPOTHYROIDISM   on synthroid 137 mcg we'll update TSH--T-4 Type II or unspecified type diabetes mellitus with renal manifestations, not stated as uncontrolled  A1c 5.8 on no meds--Will update hemoglobin A1c  Dementia with behavioral disturbance   Continue wellbutrin, zoloft and risperdol  Anemia in chronic kidney disease(285.21)  Borderline macrocytic anemia with low normal B12 and taking folate --we'll update CBC CKD (chronic kidney disease) stage 4, GFR 15-29 ml/min  Cr 1.36most recent lab --we'll update BMP PMR (polymyalgia rheumatica)  On methotrexate  Question intermittent lethargy-will update lab work including a CBC CMP TSH and T4-also re-collect urine and a  B12 level-monitor vital signs pulse ox every shift for 72 hours-   [id number] \\\\

## 2015-01-05 ENCOUNTER — Non-Acute Institutional Stay (SKILLED_NURSING_FACILITY): Payer: Medicare Other | Admitting: Internal Medicine

## 2015-01-05 ENCOUNTER — Encounter: Payer: Self-pay | Admitting: Internal Medicine

## 2015-01-05 DIAGNOSIS — R0981 Nasal congestion: Secondary | ICD-10-CM

## 2015-01-05 DIAGNOSIS — E876 Hypokalemia: Secondary | ICD-10-CM

## 2015-01-05 DIAGNOSIS — I480 Paroxysmal atrial fibrillation: Secondary | ICD-10-CM

## 2015-01-05 DIAGNOSIS — N184 Chronic kidney disease, stage 4 (severe): Secondary | ICD-10-CM

## 2015-01-05 NOTE — Progress Notes (Signed)
Patient ID: [name], male   DOB: 02-Feb-1923, 81 y.o.   MRN: [id number]   This is an acute  visit  Level care skilled.   [place].  Chief complaint- Acute visit secondary to hypokalemia-complaints of nasal congestion        HPI: Patient is 81 -year-old male who recently had routine laboratory work done which was noteable for a potassium of 2.7 on January 8-we were made aware of this yesterday and started him on potassium 40 mEq twice a day 6 doses  A urine sodium and potassium also was ordered which came back within normal limits.  Patient is not on any diuretic.  Clinically he remained stable and at baseline has no acute complaints other than having some mild nasal congestion at times.  He does not complain of any increased cough or shortness of breath and nursing staff has not noted any changes either.  .  I do note he had a lab drawn yesterday stat which showed a potassium was 2.8 BUN was improved at 12 and creatinine 1.05.  Patient is a poor historian secondary to dementia.    .  Past Medical History   Diagnosis  Date   .  CAD (coronary artery disease)      s/p NSTEMI and BMS stent diagonal07/09; Lexiscan Myoview 9/13: EF 62%, no ischemia   .  AS (aortic stenosis)      a. Echo 2009 mean gradient 26mm HG. AVA 2.18; b. Echo 4/11 mild AS mean gradient 30mm HG; c. Echo 04/2011: Mild LVH, EF 16-38%, grade 1 diastolic dysfunction, mild aortic stenosis, mean gradient 14, mild LAE; d. Echo 9/13: mod LVH, EF 50-55%, mild to mod AS, mean gradient 17 mmHg   .  AF (paroxysmal atrial fibrillation)      Holter monitor 2.5 sec pauses   .  Fatigue      CPX 11/09 VO2 14.4 (75% predicte)d, slope 35. O2 pulse normal. VO2 corrected for body weight 17.6.   Marland Kitchen  Hypothyroidism    .  Hyperlipidemia    .  History of prostate cancer    .  Allergic rhinitis    .  DM (diabetes mellitus)      type II diet controlled   .   Shingles    .  Left carotid stenosis    .  Vitamin B 12 deficiency    .  Anemia    .  Osteoarthritis    .  Nephrolithiasis    .  Obesity    .  OSA (obstructive sleep apnea)      AHI 15 PSG 09/06/08   .  Hx of colonoscopy    .  Stroke    .  Anxiety and depression    .  PMR (polymyalgia rheumatica)  05/29/2012   .  Hypogonadism male  05/29/2012   .  Hyperparathyroidism  05/29/2012   .  TIA (transient ischemic attack)  05/29/2012   .  CKD (chronic kidney disease) stage 4, GFR 15-29 ml/min  05/29/2012   .  COPD (chronic obstructive pulmonary disease)  05/29/2012   .  Myocardial infarction    .  Cancer      prostate ca history    Past Surgical History   Procedure  Laterality  Date   .  Appendectomy     .  Hernia repair     .  Right carpal tunnel release     .  Lumbar spine surgery     .  Cystectomy       neck   .  Prostatectomy     .  Vasectomy     .  Total knee arthroplasty       right   .  Cholecystectomy        Medication List                    buPROPion 300 MG 24 hr tablet    Commonly known as: WELLBUTRIN XL    Take 300 mg by mouth daily.    clopidogrel 75 MG tablet    Commonly known as: PLAVIX    Take 75 mg by mouth every evening.    folic acid 1 MG tablet    Commonly known as: FOLVITE    Take 1 mg by mouth every morning.    GERITOL COMPLETE PO    Take 1 tablet by mouth every morning.    guaiFENesin 600 MG 12 hr tablet    Commonly known as: MUCINEX    Take by mouth 2 (two) times daily.    HYDROcodone-acetaminophen 10-325 MG per tablet    Commonly known as: NORCO    Take one tablet by mouth every 4 hours as needed for pain    ipratropium-albuterol 0.5-2.5 (3) MG/3ML Soln    Commonly known as: DUONEB    Take 3 mLs by nebulization 4 (four) times daily.    levothyroxine 137 MCG tablet    Commonly known as: SYNTHROID,  LEVOTHROID    Take 137 mcg by mouth daily before breakfast.    methotrexate 2.5 MG tablet    Commonly known as: RHEUMATREX    Take 10 mg by mouth once a week. Takes 4 tablets weekly on Wednesday. Caution:Chemotherapy. Protect from light.    midodrine 2.5 MG tablet    Commonly known as: PROAMATINE    Take 1 tablet (2.5 mg total) by mouth 3 (three) times daily with meals.    omeprazole 40 MG capsule    Commonly known as: PRILOSEC    Take 40 mg by mouth daily.    polyethylene glycol packet    Commonly known as: MIRALAX / GLYCOLAX    Take 17 g by mouth daily.    RISPERDAL PO    Take by mouth.    sertraline 100 MG tablet    Commonly known as: ZOLOFT    Take 100 mg by mouth at bedtime.    traMADol 50 MG tablet    Commonly known as: ULTRAM    Take one tablet by mouth twice daily for pain    vitamin C 500 MG tablet    Commonly known as: ASCORBIC ACID    Take 500 mg by mouth at bedtime.      Meds ordered this encounter   Medications   .  buPROPion (WELLBUTRIN XL) 300 MG 24 hr tablet     Sig: Take 300 mg by mouth daily.   Marland Kitchen  omeprazole (PRILOSEC) 40 MG capsule     Sig: Take 40 mg by mouth daily.   Marland Kitchen  guaiFENesin (MUCINEX) 600 MG 12 hr tablet     Sig: Take by mouth 2 (two) times daily.    Immunization History   Administered  Date(s) Administered   .  Influenza Split  09/03/2012   .  Influenza,inj,Quad PF,36+ Mos  09/23/2013   .  Influenza-Unspecified  10/05/2011   .  Pneumococcal Conjugate-13  04/25/2011, 09/23/2013   .  Tdap  04/24/2012    History  Substance Use Topics   .  Smoking status:  Former Smoker -- 0.50 packs/day for 25 years     Types:  Cigarettes     Quit date:  12/25/1958   .  Smokeless tobacco:  Former Systems developer     Types:  Snuff, [name]     Quit date:  08/06/1970      Comment: quit in 1950s   .  Alcohol Use:  No   Family history is noncontributory  Review of  Systems  DATA OBTAINED: from patient, poor historian  GENERAL:  no fevers, fatigue, appetite changes  SKIN: No itching, rash or wounds  EYES: No eye pain has, redness,and discharge-at times  EARS: No earache, tinnitus, change in hearing  NOSE:has mild congestionat times--, drainage or bleeding  MOUTH/THROAT: No mouth or tooth pain, No sore throat  RESPIRATORY: No cough, wheezing, SOB  CARDIAC: No chest pain, palpitations, lower extremity edema  GI: No abdominal pain, No N/V/D or constipation, No heartburn or reflux  GU: No dysuria, frequency or urgency, or incontinence  MUSCULOSKELETAL: No unrelieved bone/joint pain  NEUROLOGIC: No headache, dizziness or focal weakness  PSYCHIATRIC: No overt anxiety or sadness                    Physical Exam Temperature 97.3 pulse 84 respirations 18 blood pressure 120/75 GENERAL APPEARANCE: Alert, conversant. Appropriately groomed. No acute distress.  SKIN: No diaphoresis rash  HEAD: Normocephalic, atraumatic  EYES: Has  slightly erythematous conjunctivae-there is minimal exudate noted at the lower lid margins. Pupils round, reactive. EOMs intact.  EARS: External exam WNL, canals clear. Hearing grossly normal.  NOSE: No deformity or discharge.  MOUTH/THROAT: Lips w/o lesions oropharynx is clear mucous membranes moist.  RESPIRATORY: Breathing is even, unlabored. Lung sounds are clear  CARDIOVASCULAR: Heart RRR no murmurs, rubs or gallops. trace peripheral edema.  GASTROINTESTINAL: Abdomen is soft, non-tender, not distended w/ normal bowel sounds  MUSCULOSKELETAL: No abnormal joints or musculature  NEUROLOGIC: Cranial nerves 2-12 grossly intact. Moves all extremities  PSYCHIATRIC: dementia, no behavioral issues --appears baseline Patient Active Problem List    Diagnosis  Date Noted   .  Type II or unspecified type diabetes mellitus with renal manifestations, not stated as uncontrolled  07/23/2014    .  Anemia in chronic kidney disease(285.21)  07/08/2014   .  Acute posthemorrhagic anemia  07/07/2014   .  Hip fracture, right  06/23/2014   .  Skin tear of upper arm without complication  74/11/8785   .  Unspecified constipation  06/23/2014   .  Cough  06/17/2014   .  Coronary atherosclerosis of native coronary artery  06/03/2014   .  Macrocytosis  05/13/2014   .  Conjunctivitis unspecified  05/07/2014   .  Orthostatic hypotension  05/02/2014   .  Sinusitis  05/02/2014   .  Acute encephalopathy  05/02/2014   .  Lethargy  04/29/2014   .  Failure to thrive  04/29/2014   .  HCAP (healthcare-associated pneumonia)  03/12/2014   .  Acute respiratory failure with hypoxia  03/12/2014   .  PMR (polymyalgia rheumatica)  03/12/2014   .  Dementia with behavioral disturbance  01/09/2014   .  Insomnia  09/23/2013   .  Hypogonadism male  05/29/2012   .  Hyperparathyroidism  05/29/2012   .  CKD (chronic kidney disease) stage 4, GFR 15-29 ml/min  05/29/2012   .  COPD (chronic obstructive pulmonary disease)  05/29/2012   .  OSA (obstructive sleep apnea)  05/29/2012   .  Chronic steroid use  05/29/2012   .  Depression    .  AS (aortic stenosis)    .  AF (paroxysmal atrial fibrillation)    .  HYPERTENSION, BENIGN  04/07/2010   .  RECTAL BLEEDING  07/21/2008   .  PROSTATE CANCER, HX OF  06/20/2008   .  VITAMIN B12 DEFICIENCY  06/17/2008   .  BACK PAIN  06/15/2008   .  HYPOTHYROIDISM  04/21/2008   .  HYPERLIPIDEMIA  04/21/2008   .  ANEMIA  04/21/2008   Labs.  01/04/2014.  Sodium 142 potassium 2.8 BUN 12 creatinine 1.05.  Urine sodium 73.  Urine potassium 18.  January eighth 2015.  Sodium 143 potassium 2.7 BUN 18 creatinine 1.2.  Albumin 2.3 otherwise liver function tests within normal limits.  T4 normal at 1.1--TSH 6.87 this is actually down from previous level.  WBC 8.4  hemoglobin 9.7 platelets 378  12/24/2014.  Urinalysis negative.  11/03/2014.  Sodium 138 potassium 4 BUN 15 creatinine 1.2.  WBC 8.5 hemoglobin 10.6 platelets 331.  Hemoglobin A1c 5.5.  TSH-8.17  09/28/2014.  T4-0.9. TSH9.27.  09/21/2014.  Sodium 140 potassium 4.1 BUN 19 creatinine 1.2.  WBC 8.8 hemoglobin 9.7 platelets 327.  09/03/2014.  Albumin 3.1 otherwise liver function tests within normal limits    Component  Value  Date/Time    WBC  9.0  05/01/2014 0426    RBC  3.93*  05/01/2014 0426    RBC  3.06*  06/11/2008 2010    HGB  13.2  05/01/2014 0426    HCT  40.6  05/01/2014 0426    PLT  322  05/01/2014 0426    MCV  103.3*  05/01/2014 0426    LYMPHSABS  1.7  04/29/2014 0834    MONOABS  1.0  04/29/2014 0834    EOSABS  0.3  04/29/2014 0834    BASOSABS  0.0  04/29/2014 0834   CMP    Component  Value  Date/Time    NA  139  05/01/2014 0426    K  4.2  05/01/2014 0426    CL  103  05/01/2014 0426    CO2  21  05/01/2014 0426    GLUCOSE  118*  05/01/2014 0426    BUN  11  05/01/2014 0426    CREATININE  1.25  05/01/2014 0426    CALCIUM  8.8  05/01/2014 0426    PROT  6.4  04/29/2014 0834    ALBUMIN  3.4*  04/29/2014 0834    AST  31  04/29/2014 0834    ALT  22  04/29/2014 0834    ALKPHOS  68  04/29/2014 0834    BILITOT  0.5  04/29/2014 0834    GFRNONAA  49*  05/01/2014 0426    GFRAA  57*  05/01/2014 0426   Assessment and Plan Hypokalemia-this is being supplemented with 40 mg of potassium twice a day 3 doses-update BMP has been ordered for tomorrow.  Urine sodium and potassium appeared within normal limits-clinically he appears to be at baseline at this point will have to be monitored closely-with magnesium checked tomorrow as well  Monitor vital signs pulse ox every shift 72 hours   Nasal congestion-this appears pretty mild will order Flonase 1 spray twice a day to both nostrils 3 days and  monitor-of note he continues on Mucinex as well as.    Orthostatic hypotension this point will monitor-he has variable  blood pressures-goal is somewhat loose control of hypertension secondary to his orthostatic issues-recent blood pressures range 120/75-185/101 although this was somewhat distant reading in one would wonder if this was taken by machine-I do not see consistent elevations certainly in that range- AF (paroxysmal atrial fibrillation)  On no meds;on no anticoag 2/2 fall risk  HYPERTENSION, BENIGN  On no meds and 2/2 orthostatic hypotension prob won't be on any  COPD (chronic obstructive pulmonary disease)  Continue duoneb and muccinex this appears to be stable HYPOTHYROIDISM   on synthroid 137 mcg --T4 is within normal limits TSH is minimally elevated Type II or unspecified type diabetes mellitus with renal manifestations, not stated as uncontrolled  A1c 5.8 on no meds-- Dementia with behavioral disturbance  Continue wellbutrin, zoloft and risperdol  Anemia in chronic kidney disease(285.21)  Borderline macrocytic anemia with low normal B12 and taking folate -hemoglobin appears at baseline at 9.7 MCV is 100.7 CKD (chronic kidney disease) stage 4, GFR 15-29 ml/min  Cr 1.22most recent lab --this appears stable again he does have some hypokalemia BMP update has been ordered PMR (polymyalgia rheumatica)  On methotrexate      [id number]--

## 2015-01-07 ENCOUNTER — Non-Acute Institutional Stay (SKILLED_NURSING_FACILITY): Payer: Medicare Other | Admitting: Internal Medicine

## 2015-01-07 DIAGNOSIS — H109 Unspecified conjunctivitis: Secondary | ICD-10-CM

## 2015-01-07 DIAGNOSIS — E876 Hypokalemia: Secondary | ICD-10-CM

## 2015-01-07 NOTE — Progress Notes (Signed)
Patient ID: [name], male   DOB: Oct 03, 1923, 81 y.o.   MRN: [id number]     Level care skilled.   [name].  Chief complaint- Acute visit secondary to hypokalemia-conjunctivitis        HPI: Patient is 81 -year-old male who recently had routine laboratory work done which was noteable for a potassium of 2.7 on January 8-we were made aware of this on Monday and started him on potassium 40 mEq twice a day 6 doses  A urine sodium and potassium also was ordered which came back within normal limits.  Patient is not on any diuretic.  Clinically he remained stable and at baseline has no acute complaints other than having some mild nasal congestion at times. For which he is receiving a short course of Flonase  He does not complain of any increased cough or shortness of breath and nursing staff has not noted any changes either.  \Updated lab work is come back which shows normalization of potassium at 3.9 as of yesterday.  Of note he does have some drainage from his eyes with somewhat chronic he has been on gentamicin eyedrops in the past it appears he is not on this  Currently-- he is onAkwa tears it appears-will restart the gentamicin  His vital signs remained stable     Patient is a poor historian secondary to dementia.    .  Past Medical History   Diagnosis  Date   .  CAD (coronary artery disease)      s/p NSTEMI and BMS stent diagonal07/09; Lexiscan Myoview 9/13: EF 62%, no ischemia   .  AS (aortic stenosis)      a. Echo 2009 mean gradient 74mm HG. AVA 2.18; b. Echo 4/11 mild AS mean gradient 51mm HG; c. Echo 04/2011: Mild LVH, EF 40-98%, grade 1 diastolic dysfunction, mild aortic stenosis, mean gradient 14, mild LAE; d. Echo 9/13: mod LVH, EF 50-55%, mild to mod AS, mean gradient 17 mmHg   .  AF (paroxysmal atrial fibrillation)      Holter monitor 2.5 sec pauses   .  Fatigue      CPX 11/09 VO2 14.4 (75% predicte)d, slope 35. O2  pulse normal. VO2 corrected for body weight 17.6.   Marland Kitchen  Hypothyroidism    .  Hyperlipidemia    .  History of prostate cancer    .  Allergic rhinitis    .  DM (diabetes mellitus)      type II diet controlled   .  Shingles    .  Left carotid stenosis    .  Vitamin B 12 deficiency    .  Anemia    .  Osteoarthritis    .  Nephrolithiasis    .  Obesity    .  OSA (obstructive sleep apnea)      AHI 15 PSG 09/06/08   .  Hx of colonoscopy    .  Stroke    .  Anxiety and depression    .  PMR (polymyalgia rheumatica)  05/29/2012   .  Hypogonadism male  05/29/2012   .  Hyperparathyroidism  05/29/2012   .  TIA (transient ischemic attack)  05/29/2012   .  CKD (chronic kidney disease) stage 4, GFR 15-29 ml/min  05/29/2012   .  COPD (chronic obstructive pulmonary disease)  05/29/2012   .  Myocardial infarction    .  Cancer      prostate ca history    Past  Surgical History   Procedure  Laterality  Date   .  Appendectomy     .  Hernia repair     .  Right carpal tunnel release     .  Lumbar spine surgery     .  Cystectomy       neck   .  Prostatectomy     .  Vasectomy     .  Total knee arthroplasty       right   .  Cholecystectomy        Medication List                    buPROPion 300 MG 24 hr tablet    Commonly known as: WELLBUTRIN XL    Take 300 mg by mouth daily.    clopidogrel 75 MG tablet    Commonly known as: PLAVIX    Take 75 mg by mouth every evening.    folic acid 1 MG tablet    Commonly known as: FOLVITE    Take 1 mg by mouth every morning.    GERITOL COMPLETE PO    Take 1 tablet by mouth every morning.    guaiFENesin 600 MG 12 hr tablet    Commonly known as: MUCINEX    Take by mouth 2 (two) times daily.    HYDROcodone-acetaminophen 10-325 MG per tablet    Commonly known as: NORCO    Take one tablet by mouth  every 4 hours as needed for pain    ipratropium-albuterol 0.5-2.5 (3) MG/3ML Soln    Commonly known as: DUONEB    Take 3 mLs by nebulization 4 (four) times daily.    levothyroxine 137 MCG tablet    Commonly known as: SYNTHROID, LEVOTHROID    Take 137 mcg by mouth daily before breakfast.    methotrexate 2.5 MG tablet    Commonly known as: RHEUMATREX    Take 10 mg by mouth once a week. Takes 4 tablets weekly on Wednesday. Caution:Chemotherapy. Protect from light.    midodrine 2.5 MG tablet    Commonly known as: PROAMATINE    Take 1 tablet (2.5 mg total) by mouth 3 (three) times daily with meals.    omeprazole 40 MG capsule    Commonly known as: PRILOSEC    Take 40 mg by mouth daily.    polyethylene glycol packet    Commonly known as: MIRALAX / GLYCOLAX    Take 17 g by mouth daily.    RISPERDAL PO    Take by mouth.    sertraline 100 MG tablet    Commonly known as: ZOLOFT    Take 100 mg by mouth at bedtime.    traMADol 50 MG tablet    Commonly known as: ULTRAM    Take one tablet by mouth twice daily for pain    vitamin C 500 MG tablet    Commonly known as: ASCORBIC ACID    Take 500 mg by mouth at bedtime.      Meds ordered this encounter   Medications   .  buPROPion (WELLBUTRIN XL) 300 MG 24 hr tablet     Sig: Take 300 mg by mouth daily.   Marland Kitchen  omeprazole (PRILOSEC) 40 MG capsule     Sig: Take 40 mg by mouth daily.   Marland Kitchen  guaiFENesin (MUCINEX) 600 MG 12 hr tablet     Sig: Take by mouth 2 (two) times daily.    Immunization History   Administered  Date(s) Administered   .  Influenza Split  09/03/2012   .  Influenza,inj,Quad PF,36+ Mos  09/23/2013   .  Influenza-Unspecified  10/05/2011   .  Pneumococcal Conjugate-13  04/25/2011, 09/23/2013   .  Tdap  04/24/2012    History   Substance Use Topics   .  Smoking status:  Former Smoker -- 0.50 packs/day for 25 years     Types:  Cigarettes       Quit date:  12/25/1958   .  Smokeless tobacco:  Former Systems developer     Types:  Snuff, [name]     Quit date:  08/06/1970      Comment: quit in 1950s   .  Alcohol Use:  No   Family history is noncontributory  Review of Systems  DATA OBTAINED: from patient, poor historian  GENERAL:  no fevers, fatigue, appetite changes  SKIN: No itching, rash or wounds  EYES: No eye pain has, redness,and discharge-at times per nursing staff this has increased this week  EARS: No earache, tinnitus, change in hearing  NOSE:has mild congestionat times--,  no drainage or bleeding  MOUTH/THROAT: No mouth or tooth pain, No sore throat  RESPIRATORY: No cough, wheezing, SOB  CARDIAC: No chest pain, palpitations, lower extremity edema  GI: No abdominal pain, No N/V/D or constipation, No heartburn or reflux  GU: No dysuria, frequency or urgency, or incontinence  MUSCULOSKELETAL: No unrelieved bone/joint pain  NEUROLOGIC: No headache, dizziness or focal weakness  PSYCHIATRIC: No overt anxiety or sadness                    Physical Exam Temp to 98.1 pulse 80 respirations 20 blood pressure 120/62 GENERAL APPEARANCE: Alert, conversant. Appropriately groomed. No acute distress.  SKIN: No diaphoresis rash  HEAD: Normocephalic, atraumatic  EYES: Has  slightly erythematous conjunctivae-there is somewhat increasedl exudate noted at the lower lid margins. Pupils round, reactive. EOMs intact.  EARS: External exam WNL, canals clear. Hearing grossly normal.  NOSE: No deformity or discharge.  MOUTH/THROAT: Lips w/o lesions oropharynx is clear mucous membranes moist.  RESPIRATORY: Breathing is even, unlabored. Lung sounds are clear  CARDIOVASCULAR: Heart RRR no murmurs, rubs or gallops. trace peripheral edema.  GASTROINTESTINAL: Abdomen is soft, non-tender, not distended w/ normal bowel sounds  MUSCULOSKELETAL: No abnormal joints or musculature  NEUROLOGIC:  Cranial nerves 2-12 grossly intact. Moves all extremities  PSYCHIATRIC: dementia, no behavioral issues --appears baseline Patient Active Problem List    Diagnosis  Date Noted   .  Type II or unspecified type diabetes mellitus with renal manifestations, not stated as uncontrolled  07/23/2014   .  Anemia in chronic kidney disease(285.21)  07/08/2014   .  Acute posthemorrhagic anemia  07/07/2014   .  Hip fracture, right  06/23/2014   .  Skin tear of upper arm without complication  44/31/5400   .  Unspecified constipation  06/23/2014   .  Cough  06/17/2014   .  Coronary atherosclerosis of native coronary artery  06/03/2014   .  Macrocytosis  05/13/2014   .  Conjunctivitis unspecified  05/07/2014   .  Orthostatic hypotension  05/02/2014   .  Sinusitis  05/02/2014   .  Acute encephalopathy  05/02/2014   .  Lethargy  04/29/2014   .  Failure to thrive  04/29/2014   .  HCAP (healthcare-associated pneumonia)  03/12/2014   .  Acute respiratory failure with hypoxia  03/12/2014   .  PMR (polymyalgia rheumatica)  03/12/2014   .  Dementia with behavioral disturbance  01/09/2014   .  Insomnia  09/23/2013   .  Hypogonadism male  05/29/2012   .  Hyperparathyroidism  05/29/2012   .  CKD (chronic kidney disease) stage 4, GFR 15-29 ml/min  05/29/2012   .  COPD (chronic obstructive pulmonary disease)  05/29/2012   .  OSA (obstructive sleep apnea)  05/29/2012   .  Chronic steroid use  05/29/2012   .  Depression    .  AS (aortic stenosis)    .  AF (paroxysmal atrial fibrillation)    .  HYPERTENSION, BENIGN  04/07/2010   .  RECTAL BLEEDING  07/21/2008   .  PROSTATE CANCER, HX OF  06/20/2008   .  VITAMIN B12 DEFICIENCY  06/17/2008   .  BACK PAIN  06/15/2008   .  HYPOTHYROIDISM  04/21/2008   .  HYPERLIPIDEMIA  04/21/2008   .  ANEMIA  04/21/2008   Labs  Jan 06 2015.  Magnesium  2.0.  Sodium 143 potassium 3.9 BUN 16 creatinine 1.2.  General is 2016.  WBC 8.4 hemoglobin 9.7 platelets 378  .  01/04/2014.  Sodium 142 potassium 2.8 BUN 12 creatinine 1.05.  Urine sodium 73.  Urine potassium 18.  January eighth 2015.  Sodium 143 potassium 2.7 BUN 18 creatinine 1.2.  Albumin 2.3 otherwise liver function tests within normal limits.  T4 normal at 1.1--TSH 6.87 this is actually down from previous level.  WBC 8.4 hemoglobin 9.7 platelets 378  12/24/2014.  Urinalysis negative.  11/03/2014.  Sodium 138 potassium 4 BUN 15 creatinine 1.2.  WBC 8.5 hemoglobin 10.6 platelets 331.  Hemoglobin A1c 5.5.  TSH-8.17  09/28/2014.  T4-0.9. TSH9.27.  09/21/2014.  Sodium 140 potassium 4.1 BUN 19 creatinine 1.2.  WBC 8.8 hemoglobin 9.7 platelets 327.  09/03/2014.  Albumin 3.1 otherwise liver function tests within normal limits    Component  Value  Date/Time    WBC  9.0  05/01/2014 0426    RBC  3.93*  05/01/2014 0426    RBC  3.06*  06/11/2008 2010    HGB  13.2  05/01/2014 0426    HCT  40.6  05/01/2014 0426    PLT  322  05/01/2014 0426    MCV  103.3*  05/01/2014 0426    LYMPHSABS  1.7  04/29/2014 0834    MONOABS  1.0  04/29/2014 0834    EOSABS  0.3  04/29/2014 0834    BASOSABS  0.0  04/29/2014 0834   CMP    Component  Value  Date/Time    NA  139  05/01/2014 0426    K  4.2  05/01/2014 0426    CL  103  05/01/2014 0426    CO2  21  05/01/2014 0426    GLUCOSE  118*  05/01/2014 0426    BUN  11  05/01/2014 0426    CREATININE  1.25  05/01/2014 0426    CALCIUM  8.8  05/01/2014 0426    PROT  6.4  04/29/2014 0834    ALBUMIN  3.4*  04/29/2014 0834    AST  31  04/29/2014 0834    ALT  22  04/29/2014 0834    ALKPHOS  68  04/29/2014 0834    BILITOT  0.5  04/29/2014 0834    GFRNONAA  49*  05/01/2014 0426    GFRAA  57*  05/01/2014 0426   Assessment and Plan Hypokalemia- This has now  normalized magnesium is within  normal limits-we will reduce potassium to 20 mEq a day and have this rechecked tomorrow will have to keep a close eye on this clinically he appears to be stable--this will need to be rechecked first laboratory day next week as well  Conjunctivitis-again this appears to have reoccurred-will start gentamicin eyedrops 0.3% 2 drops each eye 4 times a day for a week he may need to be on this somewhat empirically   Nasal congestion-this appears pretty mild --he has been started on a short course of Flonase    Orthostatic hypotension this point will monitor-he has variable blood pressures-goal is somewhat loose control of hypertension secondary to his orthostatic issues - AF (paroxysmal atrial fibrillation)  On no meds;on no anticoag 2/2 fall risk --this appears rate controlled HYPERTENSION, BENIGN  On no meds and 2/2 orthostatic hypotension prob won't be on any  COPD (chronic obstructive pulmonary disease)  Continue duoneb and muccinex this appears to be stable HYPOTHYROIDISM   on synthroid 137 mcg --T4 is within normal limits TSH is minimally elevated Type II or 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

## 2015-01-14 ENCOUNTER — Non-Acute Institutional Stay (SKILLED_NURSING_FACILITY): Payer: Medicare Other | Admitting: Internal Medicine

## 2015-01-14 ENCOUNTER — Encounter: Payer: Self-pay | Admitting: Internal Medicine

## 2015-01-14 DIAGNOSIS — K6289 Other specified diseases of anus and rectum: Secondary | ICD-10-CM

## 2015-01-14 DIAGNOSIS — IMO0001 Reserved for inherently not codable concepts without codable children: Secondary | ICD-10-CM

## 2015-01-14 DIAGNOSIS — R609 Edema, unspecified: Secondary | ICD-10-CM

## 2015-01-14 DIAGNOSIS — E876 Hypokalemia: Secondary | ICD-10-CM

## 2015-01-14 DIAGNOSIS — T731XXD Deprivation of water, subsequent encounter: Secondary | ICD-10-CM

## 2015-01-14 DIAGNOSIS — H109 Unspecified conjunctivitis: Secondary | ICD-10-CM

## 2015-01-14 DIAGNOSIS — R634 Abnormal weight loss: Secondary | ICD-10-CM

## 2015-01-14 NOTE — Progress Notes (Signed)
Patient ID: [name], male   DOB: [birth year], 80 y.o.   MRN: [id number]   This is an acute visit   [place].  Chief complaint- Acute visit secondary to hypokalemia-conjunctivitis--weight loss-thirst        HPI: Patient is 81 -year-old male with a complex medical history including dementia COPD atrial fibrillation who recently had routine laboratory work done which was noteable for a potassium of 2.7 on January 8-we   started him on potassium 40 mEq twice a day 6 doses--and this normalized fairly quickly potassium on January 20 is 3.7.  He is now on potassium 20 mEq a day and update a BMP has been ordered--magnesium level was normal at 2.0  A urine sodium and potassium also was ordered which came back within normal limits.  Patient is not on any diuretic.  Clinically he remained stable and at baseline has no acute complaints other than having some mild nasal congestion at times. For which he is received a short course of Flonase   .  Of note he does have some drainage from his eyes with somewhat chronic he has been on gentamicin eyedrops in the past and this is been restarted-patient's   family is concerned about the recurrent conjunctivitis-I did discuss this with Dr. Sheppard Coil via phoneWeI suspect patient is rubbing his eyes which somewhat aggravates the situation and I explained this to the family-we will have to keep an eye on this certainly  He apparently also has lost weight-it appears dietary has assess this and started him on Magic cup supplementation on January 15 his weight will have to be monitored as well.  Another issue has been some increased thirst by patient-I did discuss this with Dr. Sheppard Coil as well-he does have a listed history of diabetes but is not on any medication and his hemoglobin A1c's have been satisfactory in the 5-6 range recently-patient is on thickened liquids-nursing home staff has addressed this and he has been cleared for free water  following strict oral care with no solid intake-hopefully this will help somewhat with this thirst situation I suspect with thickenedliquids this is been a challenge for patient which is understandable--and family has been concerned about this as well.  Family of patient hasnotedf some rectal pain complaints from patient-- nursing staff has not noted any lesions in the area-family states this appears to be more internal rectal discomfort--   His vital signs remained stable     Patient is a poor historian secondary to dementia.    .  Past Medical History   Diagnosis  Date   .  CAD (coronary artery disease)      s/p NSTEMI and BMS stent diagonal07/09; Lexiscan Myoview 9/13: EF 62%, no ischemia   .  AS (aortic stenosis)      a. Echo 2009 mean gradient 30mm HG. AVA 2.18; b. Echo 4/11 mild AS mean gradient 36mm HG; c. Echo 04/2011: Mild LVH, EF 78-67%, grade 1 diastolic dysfunction, mild aortic stenosis, mean gradient 14, mild LAE; d. Echo 9/13: mod LVH, EF 50-55%, mild to mod AS, mean gradient 17 mmHg   .  AF (paroxysmal atrial fibrillation)      Holter monitor 2.5 sec pauses   .  Fatigue      CPX 11/09 VO2 14.4 (75% predicte)d, slope 35. O2 pulse normal. VO2 corrected for body weight 17.6.   Marland Kitchen  Hypothyroidism    .  Hyperlipidemia    .  History of prostate cancer    .  Allergic rhinitis    .  DM (diabetes mellitus)      type II diet controlled   .  Shingles    .  Left carotid stenosis    .  Vitamin B 12 deficiency    .  Anemia    .  Osteoarthritis    .  Nephrolithiasis    .  Obesity    .  OSA (obstructive sleep apnea)      AHI 15 PSG 09/06/08   .  Hx of colonoscopy    .  Stroke    .  Anxiety and depression    .  PMR (polymyalgia rheumatica)  05/29/2012   .  Hypogonadism male  05/29/2012   .  Hyperparathyroidism  05/29/2012   .  TIA (transient ischemic attack)  05/29/2012   .  CKD (chronic  kidney disease) stage 4, GFR 15-29 ml/min  05/29/2012   .  COPD (chronic obstructive pulmonary disease)  05/29/2012   .  Myocardial infarction    .  Cancer      prostate ca history    Past Surgical History   Procedure  Laterality  Date   .  Appendectomy     .  Hernia repair     .  Right carpal tunnel release     .  Lumbar spine surgery     .  Cystectomy       neck   .  Prostatectomy     .  Vasectomy     .  Total knee arthroplasty       right   .  Cholecystectomy        Medication List                    buPROPion 300 MG 24 hr tablet    Commonly known as: WELLBUTRIN XL    Take 300 mg by mouth daily.    clopidogrel 75 MG tablet    Commonly known as: PLAVIX    Take 75 mg by mouth every evening.    folic acid 1 MG tablet    Commonly known as: FOLVITE    Take 1 mg by mouth every morning.    GERITOL COMPLETE PO    Take 1 tablet by mouth every morning.    guaiFENesin 600 MG 12 hr tablet    Commonly known as: MUCINEX    Take by mouth 2 (two) times daily.    HYDROcodone-acetaminophen 10-325 MG per tablet    Commonly known as: NORCO    Take one tablet by mouth every 4 hours as needed for pain    ipratropium-albuterol 0.5-2.5 (3) MG/3ML Soln    Commonly known as: DUONEB    Take 3 mLs by nebulization 4 (four) times daily.    levothyroxine 137 MCG tablet    Commonly known as: SYNTHROID, LEVOTHROID    Take 137 mcg by mouth daily before breakfast.    methotrexate 2.5 MG tablet    Commonly known as: RHEUMATREX    Take 10 mg by mouth once a week. Takes 4 tablets weekly on Wednesday. Caution:Chemotherapy. Protect from light.    midodrine 2.5 MG tablet    Commonly known as: PROAMATINE    Take 1 tablet (2.5 mg total) by mouth 3 (three) times daily with meals.    omeprazole 40 MG capsule    Commonly known as: PRILOSEC    Take 40 mg by mouth daily.    polyethylene  glycol packet  Commonly known as: MIRALAX / GLYCOLAX    Take 17 g by mouth daily.    RISPERDAL PO    Take by mouth.    sertraline 100 MG tablet    Commonly known as: ZOLOFT    Take 100 mg by mouth at bedtime.    traMADol 50 MG tablet    Commonly known as: ULTRAM    Take one tablet by mouth twice daily for pain    vitamin C 500 MG tablet    Commonly known as: ASCORBIC ACID    Take 500 mg by mouth at bedtime.      Meds ordered this encounter   Medications   .  buPROPion (WELLBUTRIN XL) 300 MG 24 hr tablet     Sig: Take 300 mg by mouth daily.   Marland Kitchen  omeprazole (PRILOSEC) 40 MG capsule     Sig: Take 40 mg by mouth daily.   Marland Kitchen  guaiFENesin (MUCINEX) 600 MG 12 hr tablet     Sig: Take by mouth 2 (two) times daily.    Immunization History   Administered  Date(s) Administered   .  Influenza Split  09/03/2012   .  Influenza,inj,Quad PF,36+ Mos  09/23/2013   .  Influenza-Unspecified  10/05/2011   .  Pneumococcal Conjugate-13  04/25/2011, 09/23/2013   .  Tdap  04/24/2012    History   Substance Use Topics   .  Smoking status:  Former Smoker -- 0.50 packs/day for 25 years     Types:  Cigarettes     Quit date:  12/25/1958   .  Smokeless tobacco:  Former Systems developer     Types:  Snuff, [name]     Quit date:  08/06/1970      Comment: quit in 1950s   .  Alcohol Use:  No   Family history is noncontributory  Review of Systems  DATA OBTAINED: from patient, poor historian though this is limited-likely obtained from nursing staff  GENERAL:  no fevers, fatigue, appetite changes??  has lost some weigh--family notes that he has had some thirst nursing staff states this is more prominent later in the day t  SKIN: No itching, rash or wounds  EYES: No eye pain has, redness,and discharge-he has been started on gentamicin drops again   which has helped in the past S: No earache, tinnitus, change in hearing   NOSE:has mild congestionat times--,  no drainage or bleeding  MOUTH/THROAT: No mouth or tooth pain, No sore throat  RESPIRATORY: No cough, wheezing, SOB  CARDIAC: No chest pain, palpitations  mild, lower extremity edema  GI: No abdominal pain, No N/V/D or constipation, No heartburn or reflu  Rectal-does complain of some rectal pain at times x  GU: No dysuria, frequency or urgency, or incontinence  MUSCULOSKELETAL: No unrelieved bone/joint pain  NEUROLOGIC: No headache, dizziness or focal weakness  PSYCHIATRIC: No overt anxiety or sadness--history of dementia                    Physical Exam Temperature 97.5 pulse 80 respirations 20 blood pressure 143/87 GENERAL APPEARANCE: Alert, conversant. Appropriately groomed. No acute distress.  SKIN: No diaphoresis rash --right heel there is a small blanchable erythematous area this will have to be monitored by wound care does not appear to be painful or infected HEAD: Normocephalic, atraumatic  EYES: Has  slightly erythematous conjunctivae-small amount of exudate noted at the lower lid margins. Pupils round, reactive. EOMs intact.  EARS: External exam WNL, canals clear.  Hearing grossly normal.  NOSE: No deformity or discharge.  MOUTH/THROAT: Lips w/o lesions oropharynx is clear mucous membranes  fairly moist.  RESPIRATORY: Breathing is even, unlabored. Lung sounds are clear --with shallow air entry CARDIOVASCULAR: Heart RRR no murmurs, rubs or gallops. trace peripheral edema however appears somewhat increased on the right leg today there is a positive pedal pulse is not erythematous and  Is  nontender.  GASTROINTESTINAL: Abdomen is soft, non-tender, not distended w/ normal bowel sounds  Rectal-I could not appreciate any external hemorrhoids digital exam I could not appreciate hemorrhoids although sometimes this is difficult to fully assess MUSCULOSKELETAL: No abnormal joints or musculature  NEUROLOGIC: Cranial  nerves 2-12 grossly intact. Moves all extremities  PSYCHIATRIC: dementia, no behavioral issues --appears baseline Patient Active Problem List    Diagnosis  Date Noted   .  Type II or unspecified type diabetes mellitus with renal manifestations, not stated as uncontrolled  07/23/2014   .  Anemia in chronic kidney disease(285.21)  07/08/2014   .  Acute posthemorrhagic anemia  07/07/2014   .  Hip fracture, right  06/23/2014   .  Skin tear of upper arm without complication  96/22/2979   .  Unspecified constipation  06/23/2014   .  Cough  06/17/2014   .  Coronary atherosclerosis of native coronary artery  06/03/2014   .  Macrocytosis  05/13/2014   .  Conjunctivitis unspecified  05/07/2014   .  Orthostatic hypotension  05/02/2014   .  Sinusitis  05/02/2014   .  Acute encephalopathy  05/02/2014   .  Lethargy  04/29/2014   .  Failure to thrive  04/29/2014   .  HCAP (healthcare-associated pneumonia)  03/12/2014   .  Acute respiratory failure with hypoxia  03/12/2014   .  PMR (polymyalgia rheumatica)  03/12/2014   .  Dementia with behavioral disturbance  01/09/2014   .  Insomnia  09/23/2013   .  Hypogonadism male  05/29/2012   .  Hyperparathyroidism  05/29/2012   .  CKD (chronic kidney disease) stage 4, GFR 15-29 ml/min  05/29/2012   .  COPD (chronic obstructive pulmonary disease)  05/29/2012   .  OSA (obstructive sleep apnea)  05/29/2012   .  Chronic steroid use  05/29/2012   .  Depression    .  AS (aortic stenosis)    .  AF (paroxysmal atrial fibrillation)    .  HYPERTENSION, BENIGN  04/07/2010   .  RECTAL BLEEDING  07/21/2008   .  PROSTATE CANCER, HX OF  06/20/2008   .  VITAMIN B12 DEFICIENCY  06/17/2008   .  BACK PAIN  06/15/2008   .  HYPOTHYROIDISM  04/21/2008   .  HYPERLIPIDEMIA  04/21/2008   .  ANEMIA  04/21/2008   Labs 01/13/2015.  Sodium 137 potassium 3.7 BUN  17 creatinine 1.24.    Jan 06 2015.  Magnesium 2.0.  Sodium 143 potassium 3.9 BUN 16 creatinine 1.2.  General is 2016.  WBC 8.4 hemoglobin 9.7 platelets 378  .  01/04/2014.  Sodium 142 potassium 2.8 BUN 12 creatinine 1.05.  Urine sodium 73.  Urine potassium 18.  January eighth 2015.  Sodium 143 potassium 2.7 BUN 18 creatinine 1.2.  Albumin 2.3 otherwise liver function tests within normal limits.  T4 normal at 1.1--TSH 6.87 this is actually down from previous level.  WBC 8.4 hemoglobin 9.7 platelets 378  12/24/2014.  Urinalysis negative.  11/03/2014.  Sodium 138 potassium 4 BUN 15 creatinine 1.2.  WBC 8.5 hemoglobin 10.6 platelets 331.  Hemoglobin A1c 5.5.  TSH-8.17  09/28/2014.  T4-0.9. TSH9.27.  09/21/2014.  Sodium 140 potassium 4.1 BUN 19 creatinine 1.2.  WBC 8.8 hemoglobin 9.7 platelets 327.  09/03/2014.  Albumin 3.1 otherwise liver function tests within normal limits    Component  Value  Date/Time    WBC  9.0  05/01/2014 0426    RBC  3.93*  05/01/2014 0426    RBC  3.06*  06/11/2008 2010    HGB  13.2  05/01/2014 0426    HCT  40.6  05/01/2014 0426    PLT  322  05/01/2014 0426    MCV  103.3*  05/01/2014 0426    LYMPHSABS  1.7  04/29/2014 0834    MONOABS  1.0  04/29/2014 0834    EOSABS  0.3  04/29/2014 0834    BASOSABS  0.0  04/29/2014 0834   CMP    Component  Value  Date/Time    NA  139  05/01/2014 0426    K  4.2  05/01/2014 0426    CL  103  05/01/2014 0426    CO2  21  05/01/2014 0426    GLUCOSE  118*  05/01/2014 0426    BUN  11  05/01/2014 0426    CREATININE  1.25  05/01/2014 0426    CALCIUM  8.8  05/01/2014 0426    PROT  6.4  04/29/2014 0834    ALBUMIN  3.4*  04/29/2014 0834    AST  31  04/29/2014 0834    ALT  22  04/29/2014 0834    ALKPHOS  68  04/29/2014 0834    BILITOT  0.5  04/29/2014 0834    GFRNONAA  49*  05/01/2014 0426    GFRAA  57*  05/01/2014 0426     Assessment and Plan Hypokalemia- This has now normalized magnesium is within normal limits on  potassium  20 mEq a day and have this rechecked will have to keep a close eye on this clinically he appears to be stable--this will need to be rechecked  History of thirst-as noted above he can now have free water following strict oral care-hopefully this will help but will have to be monitored his labs have been fairly unremarkable.  History of weight loss he has been started on supplementation this will have to be closely watched I suspect as his dementia progresses this may continue to be a challenge.  history o  Rectal discomfortf exam was fairly benign this evening-we will have to monitor cannot rule out internal hemorrhoids-this was discussed with Dr. Sheppard Coil via phone will await her assessment  Mild right leg edema-this may be somewhat new-we will obtain a venous Doppler rule out any DVT clinically he does not appear to be in any discomfort-this possibly could be dependent related.      Conjun tctivitis-again this appears to have reoccurred-on gentamicin eyedrops 0.3% 2 drops each eye 4 times a day--he may need to be on this somewhat empirically   Nasal congestion-this appears pretty mild --he hascompleted a short course of Flonase    Orthostatic hypotension this point will monitor-he has variable blood pressures-goal is somewhat loose control of hypertension secondary to his orthostatic issues - AF (paroxysmal atrial fibrillation)  On no meds;on no anticoag 2/2 fall risk --this appears rate controlled HYPERTENSION, BENIGN  On no meds and 2/2 orthostatic hypotension prob won't be on any  COPD (chronic obstructive pulmonary disease)  Continue duoneb and muccinex this appears  to be stable HYPOTHYROIDISM   on synthroid 137 mcg --T4 is within normal limits TSH is minimally elevated Type II or unspecified type diabetes mellitus with renal manifestations, not stated as  uncontrolled  A1c 5.8 on no meds--CBGs have been satisfactory and not elevated Dementia with behavioral disturbance  Continue wellbutrin, zoloft and risperdol  Anemia in chronic kidney disease(285.21)  Borderline macrocytic anemia with low normal B12 and taking folate -hemoglobin appears at baseline at 9.7 MCV is 100.7 CKD (chronic kidney disease) stage 4, GFR 15-29 ml/min  Cr 1.54most recent lab --this appears relatively stable PMR (polymyalgia rheumatica)  On methotrexate    right heel erythema-this will have to be monitored closely I do not see any infection I did discuss this with nursing with follow-up by wound care    [id number]-of note greater than 40 minutes spent assessing patient-discussing his status with family in the room as well as with nursing staff-reviewing his chart-and coordinating and formulating a plan of care for numerous diagnoses-of note greater than 50% of time spent coordinating plan of care      [phone]--

## 2015-01-16 DIAGNOSIS — R638 Other symptoms and signs concerning food and fluid intake: Secondary | ICD-10-CM | POA: Insufficient documentation

## 2015-01-16 DIAGNOSIS — R634 Abnormal weight loss: Secondary | ICD-10-CM | POA: Insufficient documentation

## 2015-01-16 DIAGNOSIS — IMO0001 Reserved for inherently not codable concepts without codable children: Secondary | ICD-10-CM | POA: Insufficient documentation

## 2015-01-28 ENCOUNTER — Non-Acute Institutional Stay (SKILLED_NURSING_FACILITY): Payer: Medicare Other | Admitting: Internal Medicine

## 2015-01-28 ENCOUNTER — Encounter: Payer: Self-pay | Admitting: Internal Medicine

## 2015-01-28 DIAGNOSIS — E876 Hypokalemia: Secondary | ICD-10-CM

## 2015-01-28 NOTE — Progress Notes (Signed)
Patient ID: [name], male   DOB: August 04, 1923, 81 y.o.   MRN: [id number]   his is an acute visit   [place].  Chief complaint- Acute visit follow-up hypokalemia        HPI: Patient is 81 -year-old male with a complex medical history including dementia COPD atrial fibrillation who recently had routine laboratory work done which was noteable for a potassium of 2.7 on January 8-we   started him on potassium 40 mEq twice a day 6 doses--and this normalized fairly quickly potassium on January 20 is 3.7.  He is now on potassium 20 mEq a day and have been following his BMPs--magnesium level was normal at 2.0  A urine sodium and potassium also was ordered which came back within normal limits.  Patient is not on any diuretic.--Is most recent lab  In chart was done January 22 it was normal potassium 4.1-   When I last saw me did complain of some drainage from his nose with this appears to have resolved fairly unremarkably-he has no complaints today      His vital signs remain stable He has lost some weight I discussed this with speech therapist today-she has spoken with the family apparently there is some thought of a waiver so his diet could be upgraded he is not really eating apparently that well with diet restrictions-secondary to concerns of aspiration  At this point apparently family is discussing the situation-meanwhile he is on supplements        .  Past Medical History   Diagnosis  Date   .  CAD (coronary artery disease)      s/p NSTEMI and BMS stent diagonal07/09; Lexiscan Myoview 9/13: EF 62%, no ischemia   .  AS (aortic stenosis)      a. Echo 2009 mean gradient 48mm HG. AVA 2.18; b. Echo 4/11 mild AS mean gradient 100mm HG; c. Echo 04/2011: Mild LVH, EF 98-92%, grade 1 diastolic dysfunction, mild aortic stenosis, mean gradient 14, mild LAE; d. Echo 9/13: mod LVH, EF 50-55%, mild to mod AS, mean gradient 17 mmHg   .  AF (paroxysmal atrial  fibrillation)      Holter monitor 2.5 sec pauses   .  Fatigue      CPX 11/09 VO2 14.4 (75% predicte)d, slope 35. O2 pulse normal. VO2 corrected for body weight 17.6.   Marland Kitchen  Hypothyroidism    .  Hyperlipidemia    .  History of prostate cancer    .  Allergic rhinitis    .  DM (diabetes mellitus)      type II diet controlled   .  Shingles    .  Left carotid stenosis    .  Vitamin B 12 deficiency    .  Anemia    .  Osteoarthritis    .  Nephrolithiasis    .  Obesity    .  OSA (obstructive sleep apnea)      AHI 15 PSG 09/06/08   .  Hx of colonoscopy    .  Stroke    .  Anxiety and depression    .  PMR (polymyalgia rheumatica)  05/29/2012   .  Hypogonadism male  05/29/2012   .  Hyperparathyroidism  05/29/2012   .  TIA (transient ischemic attack)  05/29/2012   .  CKD (chronic kidney disease) stage 4, GFR 15-29 ml/min  05/29/2012   .  COPD (chronic obstructive pulmonary disease)  05/29/2012   .  Myocardial infarction    .  Cancer      prostate ca history    Past Surgical History   Procedure  Laterality  Date   .  Appendectomy     .  Hernia repair     .  Right carpal tunnel release     .  Lumbar spine surgery     .  Cystectomy       neck   .  Prostatectomy     .  Vasectomy     .  Total knee arthroplasty       right   .  Cholecystectomy        Medication List                    buPROPion 300 MG 24 hr tablet    Commonly known as: WELLBUTRIN XL    Take 300 mg by mouth daily.    clopidogrel 75 MG tablet    Commonly known as: PLAVIX    Take 75 mg by mouth every evening.    folic acid 1 MG tablet    Commonly known as: FOLVITE    Take 1 mg by mouth every morning.    GERITOL COMPLETE PO    Take 1 tablet by mouth every morning.    guaiFENesin 600 MG 12 hr tablet    Commonly known as: MUCINEX    Take by mouth 2 (two)  times daily.    HYDROcodone-acetaminophen 10-325 MG per tablet    Commonly known as: NORCO    Take one tablet by mouth every 4 hours as needed for pain    ipratropium-albuterol 0.5-2.5 (3) MG/3ML Soln    Commonly known as: DUONEB    Take 3 mLs by nebulization 4 (four) times daily.    levothyroxine 137 MCG tablet    Commonly known as: SYNTHROID, LEVOTHROID    Take 137 mcg by mouth daily before breakfast.    methotrexate 2.5 MG tablet    Commonly known as: RHEUMATREX    Take 10 mg by mouth once a week. Takes 4 tablets weekly on Wednesday. Caution:Chemotherapy. Protect from light.    midodrine 2.5 MG tablet    Commonly known as: PROAMATINE    Take 1 tablet (2.5 mg total) by mouth 3 (three) times daily with meals.    omeprazole 40 MG capsule    Commonly known as: PRILOSEC    Take 40 mg by mouth daily.    polyethylene glycol packet    Commonly known as: MIRALAX / GLYCOLAX    Take 17 g by mouth daily.    RISPERDAL PO    Take by mouth.    sertraline 100 MG tablet    Commonly known as: ZOLOFT    Take 100 mg by mouth at bedtime.    traMADol 50 MG tablet    Commonly known as: ULTRAM    Take one tablet by mouth twice daily for pain    vitamin C 500 MG tablet    Commonly known as: ASCORBIC ACID    Take 500 mg by mouth at bedtime.      Meds ordered this encounter   Medications   .  buPROPion (WELLBUTRIN XL) 300 MG 24 hr tablet     Sig: Take 300 mg by mouth daily.   Marland Kitchen  omeprazole (PRILOSEC) 40 MG capsule     Sig: Take 40 mg by mouth daily.   Marland Kitchen  guaiFENesin (MUCINEX) 600 MG 12 hr tablet  Sig: Take by mouth 2 (two) times daily.    Immunization History   Administered  Date(s) Administered   .  Influenza Split  09/03/2012   .  Influenza,inj,Quad PF,36+ Mos  09/23/2013   .  Influenza-Unspecified  10/05/2011   .  Pneumococcal Conjugate-13  04/25/2011, 09/23/2013   .  Tdap  04/24/2012    History     Substance Use Topics   .  Smoking status:  Former Smoker -- 0.50 packs/day for 25 years     Types:  Cigarettes     Quit date:  12/25/1958   .  Smokeless tobacco:  Former Systems developer     Types:  Snuff, [name]     Quit date:  08/06/1970      Comment: quit in 1950s   .  Alcohol Use:  No   Family history is noncontributory  Review of Systems  DATA OBTAINED: from patient, poor historian though this is limited-likely obtained from nursing staff  GENERAL:  no fevers, fatigue, appetite changes??  has lost some weight-  SKIN: No itching, rash or wounds  EYES: No eye pain has, redness, And discharge appeared to have improved somewhat he has been on gentamicin drops g  NOSE:has mild congestionat times--,  no drainage or bleeding  MOUTH/THROAT: No mouth or tooth pain, No sore throat  RESPIRATORY: No cough, wheezing, SOB  CARDIAC: No chest pain, palpitations  mild, lower extremity edema  GI: No abdominal pain, No N/V/D or constipation, No heartburn or reflu  Rectal-does complain of some rectal pain at times x  GU: No dysuria, frequency or urgency, or incontinence  MUSCULOSKELETAL: No unrelieved bone/joint pain  NEUROLOGIC: No headache, dizziness or focal weakness  PSYCHIATRIC: No overt anxiety or sadness--history of dementia                    Physical Exam Temperature 97.2 pulse 71 respirations 20 blood pressure 113/71 GENERAL APPEARANCE: Alert, conversant. Appropriately groomed. No acute distress.  SKIN: No diaphoresis rash -  HEAD: Normocephalic, atraumatic  EYES:  Erythematous conjunctiva appears improved--suspect there is some element of chronicity to this. Pupils round, reactive. EOMs intact.  EARS: External exam WNL, canals clear. Hearing grossly normal.  NOSE: No deformity or discharge.  MOUTH/THROAT: Lips w/o lesions oropharynx is clear mucous membranes  fairly moist.  RESPIRATORY: Breathing is even, unlabored. Lung  sounds are clear --with shallow air entry CARDIOVASCULAR: Heart RRR no murmurs, rubs or gallops distant heart sounds   MUSCULOSKELETAL: No abnormal joints or musculature  NEUROLOGIC: Cranial nerves 2-12 grossly intact. Moves all extremities  PSYCHIATRIC: dementia, no behavioral issues --appears baseline--a bit more talkative than when I last saw him Patient Active Problem List    Diagnosis  Date Noted   .  Type II or unspecified type diabetes mellitus with renal manifestations, not stated as uncontrolled  07/23/2014   .  Anemia in chronic kidney disease(285.21)  07/08/2014   .  Acute posthemorrhagic anemia  07/07/2014   .  Hip fracture, right  06/23/2014   .  Skin tear of upper arm without complication  29/79/8921   .  Unspecified constipation  06/23/2014   .  Cough  06/17/2014   .  Coronary atherosclerosis of native coronary artery  06/03/2014   .  Macrocytosis  05/13/2014   .  Conjunctivitis unspecified  05/07/2014   .  Orthostatic hypotension  05/02/2014   .  Sinusitis  05/02/2014   .  Acute encephalopathy  05/02/2014   .  Lethargy  04/29/2014   .  Failure to thrive  04/29/2014   .  HCAP (healthcare-associated pneumonia)  03/12/2014   .  Acute respiratory failure with hypoxia  03/12/2014   .  PMR (polymyalgia rheumatica)  03/12/2014   .  Dementia with behavioral disturbance  01/09/2014   .  Insomnia  09/23/2013   .  Hypogonadism male  05/29/2012   .  Hyperparathyroidism  05/29/2012   .  CKD (chronic kidney disease) stage 4, GFR 15-29 ml/min  05/29/2012   .  COPD (chronic obstructive pulmonary disease)  05/29/2012   .  OSA (obstructive sleep apnea)  05/29/2012   .  Chronic steroid use  05/29/2012   .  Depression    .  AS (aortic stenosis)    .  AF (paroxysmal atrial fibrillation)    .  HYPERTENSION, BENIGN  04/07/2010   .  RECTAL BLEEDING  07/21/2008   .  PROSTATE CANCER,  HX OF  06/20/2008   .  VITAMIN B12 DEFICIENCY  06/17/2008   .  BACK PAIN  06/15/2008   .  HYPOTHYROIDISM  04/21/2008   .  HYPERLIPIDEMIA  04/21/2008   .  ANEMIA  04/21/2008   Labs  01/15/2015.  Sodium 136 potassium 4.1 BUN 18 creatinine 1.16.   01/13/2015.  Sodium 137 potassium 3.7 BUN 17 creatinine 1.24.    Jan 06 2015.  Magnesium 2.0.  Sodium 143 potassium 3.9 BUN 16 creatinine 1.2.  General is 2016.  WBC 8.4 hemoglobin 9.7 platelets 378  .  01/04/2014.  Sodium 142 potassium 2.8 BUN 12 creatinine 1.05.  Urine sodium 73.  Urine potassium 18.  January eighth 2015.  Sodium 143 potassium 2.7 BUN 18 creatinine 1.2.  Albumin 2.3 otherwise liver function tests within normal limits.  T4 normal at 1.1--TSH 6.87 this is actually down from previous level.  WBC 8.4 hemoglobin 9.7 platelets 378  12/24/2014.  Urinalysis negative.  11/03/2014.  Sodium 138 potassium 4 BUN 15 creatinine 1.2.  WBC 8.5 hemoglobin 10.6 platelets 331.  Hemoglobin A1c 5.5.  TSH-8.17  09/28/2014.  T4-0.9. TSH9.27.  09/21/2014.  Sodium 140 potassium 4.1 BUN 19 creatinine 1.2.  WBC 8.8 hemoglobin 9.7 platelets 327.  09/03/2014.  Albumin 3.1 otherwise liver function tests within normal limits    Component  Value  Date/Time    WBC  9.0  05/01/2014 0426    RBC  3.93*  05/01/2014 0426    RBC  3.06*  06/11/2008 2010    HGB  13.2  05/01/2014 0426    HCT  40.6  05/01/2014 0426    PLT  322  05/01/2014 0426    MCV  103.3*  05/01/2014 0426    LYMPHSABS  1.7  04/29/2014 0834    MONOABS  1.0  04/29/2014 0834    EOSABS  0.3  04/29/2014 0834    BASOSABS  0.0  04/29/2014 0834   CMP    Component  Value  Date/Time    NA  139  05/01/2014 0426    K  4.2  05/01/2014 0426    CL  103  05/01/2014 0426    CO2  21  05/01/2014 0426    GLUCOSE  118*  05/01/2014 0426    BUN  11  05/01/2014 0426    CREATININE  1.25   05/01/2014 0426    CALCIUM  8.8  05/01/2014 0426    PROT  6.4  04/29/2014 0834    ALBUMIN  3.4*  04/29/2014 1610  AST  31  04/29/2014 0834    ALT  22  04/29/2014 0834    ALKPHOS  68  04/29/2014 0834    BILITOT  0.5  04/29/2014 0834    GFRNONAA  49*  05/01/2014 0426    GFRAA  57*  05/01/2014 0426   Assessment and Plan Hypokalemia-  This appears to have had somewhat prolonged stability here which is encouraging again magnesium level was within normal limits-updated BMP has been ordered we will have to continue to keep a close  on this since he had significant low potassium but clinically appears to be doing fairly well.  Regards to weight loss this is somewhat challenging again family is considering a waiver which would upgrade his diet-certainly is a risk of aspiration here I'll will await family input-clinically appears stable again will have to keep an eye on his metabolic panel to look for any sense of hypernatremia or dehydration  [id number]          -

## 2015-02-04 ENCOUNTER — Non-Acute Institutional Stay (SKILLED_NURSING_FACILITY): Payer: Medicare Other | Admitting: Internal Medicine

## 2015-02-04 DIAGNOSIS — N184 Chronic kidney disease, stage 4 (severe): Secondary | ICD-10-CM | POA: Diagnosis not present

## 2015-02-04 DIAGNOSIS — T148 Other injury of unspecified body region: Secondary | ICD-10-CM

## 2015-02-04 DIAGNOSIS — T148XXA Other injury of unspecified body region, initial encounter: Secondary | ICD-10-CM

## 2015-02-04 DIAGNOSIS — H109 Unspecified conjunctivitis: Secondary | ICD-10-CM | POA: Diagnosis not present

## 2015-02-04 NOTE — Progress Notes (Signed)
Patient ID: [name], male   DOB: 02/11/1923, 81 y.o.   MRN: [id number]   this is an acute visit.  Level care skilled.   [place].  Chief complaint-acute visit secondary to bilateral arm bruising-follow-up hypokalemia-conjunctivitis-renal insufficiency         HPI: Patient is a 8 -year-old malewho was  admitted here from another facility  His stay recently has been complicated by several factors including hypokalemia which has been supplemented aggressively and this appears to have stabilizedrecent potassium 3.9 on February 11-.  He also apparently has lost some weight tab discuss this with Dr. Sheppard Coil and she will see him tomorrow.--Does have significant dementia and I suspect this may be contributing to it-when I saw him several weeks ago there also was complaints of feeling thirsty-these apparently have subsided somewhat-there is also a question about upgrading his diet-my understanding is family is considering this there is a risk of aspiration obviously.  Nursing staff has noted some bruising on his upper arms bilaterally which is apparently new-he also has a small sacral erythematous area that wound care is following- I do note he is on Plavix.--He has a history of TIAs-also history of atrial fibrillation     .  He does have recurring conjunctivitis and according in nursing staff gentamicin is more effective than Cipro when given topicallyi-apparently this has reoccurred-there is some question whether patient is rubbing his eyes which is contributing to the recurrent conjunctivitis.      .  Past Medical History   Diagnosis  Date   .  CAD (coronary artery disease)      s/p NSTEMI and BMS stent diagonal07/09; Lexiscan Myoview 9/13: EF 62%, no ischemia   .  AS (aortic stenosis)      a. Echo 2009 mean gradient 70mm HG. AVA 2.18; b. Echo 4/11 mild AS mean gradient 72mm HG; c. Echo 04/2011: Mild LVH, EF 91-47%, grade 1 diastolic dysfunction,  mild aortic stenosis, mean gradient 14, mild LAE; d. Echo 9/13: mod LVH, EF 50-55%, mild to mod AS, mean gradient 17 mmHg   .  AF (paroxysmal atrial fibrillation)      Holter monitor 2.5 sec pauses   .  Fatigue      CPX 11/09 VO2 14.4 (75% predicte)d, slope 35. O2 pulse normal. VO2 corrected for body weight 17.6.   Marland Kitchen  Hypothyroidism    .  Hyperlipidemia    .  History of prostate cancer    .  Allergic rhinitis    .  DM (diabetes mellitus)      type II diet controlled   .  Shingles    .  Left carotid stenosis    .  Vitamin B 12 deficiency    .  Anemia    .  Osteoarthritis    .  Nephrolithiasis    .  Obesity    .  OSA (obstructive sleep apnea)      AHI 15 PSG 09/06/08   .  Hx of colonoscopy    .  Stroke    .  Anxiety and depression    .  PMR (polymyalgia rheumatica)  05/29/2012   .  Hypogonadism male  05/29/2012   .  Hyperparathyroidism  05/29/2012   .  TIA (transient ischemic attack)  05/29/2012   .  CKD (chronic kidney disease) stage 4, GFR 15-29 ml/min  05/29/2012   .  COPD (chronic obstructive pulmonary disease)  05/29/2012   .  Myocardial infarction    .  Cancer      prostate ca history    Past Surgical History   Procedure  Laterality  Date   .  Appendectomy     .  Hernia repair     .  Right carpal tunnel release     .  Lumbar spine surgery     .  Cystectomy       neck   .  Prostatectomy     .  Vasectomy     .  Total knee arthroplasty       right   .  Cholecystectomy        Medication List                    buPROPion 300 MG 24 hr tablet    Commonly known as: WELLBUTRIN XL    Take 300 mg by mouth daily.    clopidogrel 75 MG tablet    Commonly known as: PLAVIX    Take 75 mg by mouth every evening.    folic acid 1 MG tablet    Commonly known as: FOLVITE    Take 1 mg by mouth every morning.    GERITOL  COMPLETE PO    Take 1 tablet by mouth every morning.    guaiFENesin 600 MG 12 hr tablet    Commonly known as: MUCINEX    Take by mouth 2 (two) times daily.    HYDROcodone-acetaminophen 10-325 MG per tablet    Commonly known as: NORCO    Take one tablet by mouth every 4 hours as needed for pain    ipratropium-albuterol 0.5-2.5 (3) MG/3ML Soln    Commonly known as: DUONEB    Take 3 mLs by nebulization 4 (four) times daily.    levothyroxine 137 MCG tablet    Commonly known as: SYNTHROID, LEVOTHROID    Take 137 mcg by mouth daily before breakfast.    methotrexate 2.5 MG tablet    Commonly known as: RHEUMATREX    Take 10 mg by mouth once a week. Takes 4 tablets weekly on Wednesday. Caution:Chemotherapy. Protect from light.    midodrine 2.5 MG tablet    Commonly known as: PROAMATINE    Take 1 tablet (2.5 mg total) by mouth 3 (three) times daily with meals.    omeprazole 40 MG capsule    Commonly known as: PRILOSEC    Take 40 mg by mouth daily.    polyethylene glycol packet    Commonly known as: MIRALAX / GLYCOLAX    Take 17 g by mouth daily.    RISPERDAL PO    Take by mouth.    sertraline 100 MG tablet    Commonly known as: ZOLOFT    Take 100 mg by mouth at bedtime.    traMADol 50 MG tablet    Commonly known as: ULTRAM    Take one tablet by mouth twice daily for pain    vitamin C 500 MG tablet    Commonly known as: ASCORBIC ACID    Take 500 mg by mouth at bedtime.   Potassium-30 mEq a day                               Immunization History   Administered  Date(s) Administered   .  Influenza Split  09/03/2012   .  Influenza,inj,Quad PF,36+ Mos  09/23/2013   .  Influenza-Unspecified  10/05/2011   .  Pneumococcal  Conjugate-13  04/25/2011, 09/23/2013   .  Tdap  04/24/2012    History   Substance Use Topics   .  Smoking status:  Former Smoker -- 0.50 packs/day for 25 years     Types:   Cigarettes     Quit date:  12/25/1958   .  Smokeless tobacco:  Former Systems developer     Types:  Snuff, [name]     Quit date:  08/06/1970      Comment: quit in 1950s   .  Alcohol Use:  No   Family history is noncontributory  Review of Systems  DATA OBTAINED: from patient, poor historian  GENERAL:  no fevers, fatigue, fairly somewhat spotty appetite--  SKIN: No itching, rash has a small sacral wound--also some violaceous bruising over his upper arms bilaterally  EYES: No eye pain has, redness,and discharge-a EARS: No earache, tinnitus, change in hearing  NOSE: No congestion, drainage or bleeding  MOUTH/THROAT: No mouth or tooth pain, No sore throat  RESPIRATORY: No cough, wheezing, SOB  CARDIAC: No chest pain, palpitations  mild, lower extremity edema  GI: No abdominal pain, No N/V/D or constipation, No heartburn or reflux  GU: No dysuria, frequency or urgency, or incontinence  MUSCULOSKELETAL: No unrelieved bone/joint pain  NEUROLOGIC: No headache, dizziness or focal weakness  PSYCHIATRIC: No overt anxiety or sadness                    Physical Exam Temperature 97.2 pulse 85 respirations 18 blood pressure 122/65 GENERAL APPEARANCE: Alert, conversant. Appropriately groomed. No acute distress.  SKIN: No diaphoresis rash--- has violaceous bruising which appears somewhat His upper arms bilaterally this is nontender non-warm-also has a small area of the sacrum appears to be a deep tissue wound that is not blanchable there is a small open area lateral to it  no surrounding firmness no drainage or surrounding erythema  HEAD: Normocephalic, atraumatic  EYES: Has  slightly erythematous conjunctivae-there is  exudate noted at the lower lid margins. Pupils round, reactive. EOMs intact.  EARS: External exam WNL, canals clear. Hearing grossly normal.  NOSE: No deformity or discharge.  MOUTH/THROAT: Lips w/o lesions.  RESPIRATORY: Breathing is  even, unlabored. Lung sounds are clear  CARDIOVASCULAR: Heart RRR no murmurs, rubs or gallops. trace peripheral edema.  GASTROINTESTINAL: Abdomen is soft, non-tender, not distended w/ normal bowel sounds  MUSCULOSKELETAL: No abnormal joints or musculature  NEUROLOGIC: Cranial nerves 2-12 grossly intact. Moves all extremities  PSYCHIATRIC: dementia, no behavioral issues --appears baseline Patient Active Problem List    Diagnosis  Date Noted   .  Type II or unspecified type diabetes mellitus with renal manifestations, not stated as uncontrolled  07/23/2014   .  Anemia in chronic kidney disease(285.21)  07/08/2014   .  Acute posthemorrhagic anemia  07/07/2014   .  Hip fracture, right  06/23/2014   .  Skin tear of upper arm without complication  44/81/8563   .  Unspecified constipation  06/23/2014   .  Cough  06/17/2014   .  Coronary atherosclerosis of native coronary artery  06/03/2014   .  Macrocytosis  05/13/2014   .  Conjunctivitis unspecified  05/07/2014   .  Orthostatic hypotension  05/02/2014   .  Sinusitis  05/02/2014   .  Acute encephalopathy  05/02/2014   .  Lethargy  04/29/2014   .  Failure to thrive  04/29/2014   .  HCAP (healthcare-associated pneumonia)  03/12/2014   .  Acute respiratory failure  with hypoxia  03/12/2014   .  PMR (polymyalgia rheumatica)  03/12/2014   .  Dementia with behavioral disturbance  01/09/2014   .  Insomnia  09/23/2013   .  Hypogonadism male  05/29/2012   .  Hyperparathyroidism  05/29/2012   .  CKD (chronic kidney disease) stage 4, GFR 15-29 ml/min  05/29/2012   .  COPD (chronic obstructive pulmonary disease)  05/29/2012   .  OSA (obstructive sleep apnea)  05/29/2012   .  Chronic steroid use  05/29/2012   .  Depression    .  AS (aortic stenosis)    .  AF (paroxysmal atrial fibrillation)    .  HYPERTENSION, BENIGN  04/07/2010   .  RECTAL BLEEDING   07/21/2008   .  PROSTATE CANCER, HX OF  06/20/2008   .  VITAMIN B12 DEFICIENCY  06/17/2008   .  BACK PAIN  06/15/2008   .  HYPOTHYROIDISM  04/21/2008   .  HYPERLIPIDEMIA  04/21/2008   .  ANEMIA  04/21/2008   Labs.  Most recent labs 02/04/2015.  Sodium 138 potassium 3.9 BUN 19 creatinine 1.3.  Jan eighth 2016.  WBC 8.4 hemoglobin 9.7 platelets 378.    12/24/2014.  Urinalysis negative.  11/03/2014.  Sodium 138 potassium 4 BUN 15 creatinine 1.2.  WBC 8.5 hemoglobin 10.6 platelets 331.  Hemoglobin A1c 5.5.  TSH-8.17  09/28/2014.  T4-0.9. TSH9.27.  09/21/2014.  Sodium 140 potassium 4.1 BUN 19 creatinine 1.2.  WBC 8.8 hemoglobin 9.7 platelets 327.  09/03/2014.  Albumin 3.1 otherwise liver function tests within normal limits    Component  Value  Date/Time    WBC  9.0  05/01/2014 0426    RBC  3.93*  05/01/2014 0426    RBC  3.06*  06/11/2008 2010    HGB  13.2  05/01/2014 0426    HCT  40.6  05/01/2014 0426    PLT  322  05/01/2014 0426    MCV  103.3*  05/01/2014 0426    LYMPHSABS  1.7  04/29/2014 0834    MONOABS  1.0  04/29/2014 0834    EOSABS  0.3  04/29/2014 0834    BASOSABS  0.0  04/29/2014 0834   CMP    Component  Value  Date/Time    NA  139  05/01/2014 0426    K  4.2  05/01/2014 0426    CL  103  05/01/2014 0426    CO2  21  05/01/2014 0426    GLUCOSE  118*  05/01/2014 0426    BUN  11  05/01/2014 0426    CREATININE  1.25  05/01/2014 0426    CALCIUM  8.8  05/01/2014 0426    PROT  6.4  04/29/2014 0834    ALBUMIN  3.4*  04/29/2014 0834    AST  31  04/29/2014 0834    ALT  22  04/29/2014 0834    ALKPHOS  68  04/29/2014 0834    BILITOT  0.5  04/29/2014 0834    GFRNONAA  49*  05/01/2014 0426    GFRAA  57*  05/01/2014 0426   Assessment and Plan  Conjunctivitis-this appears to have reoccurred will restart gentamicin drops 0.3% 2 drops 4 times a day for 7 days and  monitor-again follow up with Dr. Sheppard Coil tomorrow  - Some increased bruising of the arms-will check a CBC with platelets initially he does have quite fragile skin I do note he is on Plavix-  no-recent history of trauma  to the area per nursing   Borderline macrocytic anemia with low normal B12 and taking folate --we'll update CBC  CKD (chronic kidney disease) stage 4, GFR 15-29 ml/min  Cr 1.99most recent lab -continue to encourage fluids check a BMP next week  Sacral wound-this is followed  and  being treated by wound care also will be seen by Dr. Sheppard Coil tomorrow-emphasis will have to be made on keeping weight off the area as much as possible     [id number]-- \\\\

## 2015-02-05 ENCOUNTER — Non-Acute Institutional Stay (SKILLED_NURSING_FACILITY): Payer: Medicare Other | Admitting: Internal Medicine

## 2015-02-05 DIAGNOSIS — M353 Polymyalgia rheumatica: Secondary | ICD-10-CM

## 2015-02-05 DIAGNOSIS — I951 Orthostatic hypotension: Secondary | ICD-10-CM

## 2015-02-05 DIAGNOSIS — H109 Unspecified conjunctivitis: Secondary | ICD-10-CM | POA: Diagnosis not present

## 2015-02-05 DIAGNOSIS — F0391 Unspecified dementia with behavioral disturbance: Secondary | ICD-10-CM

## 2015-02-05 DIAGNOSIS — J42 Unspecified chronic bronchitis: Secondary | ICD-10-CM

## 2015-02-05 DIAGNOSIS — F03918 Unspecified dementia, unspecified severity, with other behavioral disturbance: Secondary | ICD-10-CM

## 2015-02-05 DIAGNOSIS — N184 Chronic kidney disease, stage 4 (severe): Secondary | ICD-10-CM | POA: Diagnosis not present

## 2015-02-05 DIAGNOSIS — I480 Paroxysmal atrial fibrillation: Secondary | ICD-10-CM | POA: Diagnosis not present

## 2015-02-05 DIAGNOSIS — E038 Other specified hypothyroidism: Secondary | ICD-10-CM

## 2015-02-05 DIAGNOSIS — T148 Other injury of unspecified body region: Secondary | ICD-10-CM

## 2015-02-05 DIAGNOSIS — T148XXA Other injury of unspecified body region, initial encounter: Secondary | ICD-10-CM

## 2015-02-05 DIAGNOSIS — E034 Atrophy of thyroid (acquired): Secondary | ICD-10-CM

## 2015-02-05 DIAGNOSIS — IMO0002 Reserved for concepts with insufficient information to code with codable children: Secondary | ICD-10-CM

## 2015-02-05 DIAGNOSIS — E1129 Type 2 diabetes mellitus with other diabetic kidney complication: Secondary | ICD-10-CM

## 2015-02-05 DIAGNOSIS — E1165 Type 2 diabetes mellitus with hyperglycemia: Secondary | ICD-10-CM

## 2015-02-05 NOTE — Progress Notes (Signed)
MRN: [id number] Name: [name]  Sex: male Age: 81 y.o. DOB: 1923/08/15  [place] #: [name] Facility/Room:411 Level Of Care: SNF Provider: Inocencio Homes D Emergency Contacts: Extended Emergency Contact Information Primary Emergency Contact: [name],[name] Address: [address]          [place], [place] [address] [name] of [place] Phone: [phone] Work Phone: [phone] Mobile Phone: [phone] Relation: Son Secondary Emergency Contact: Brown,Vicky Address: [address] [place]          [place], [place] [address] [place] of [place] Phone: [phone] Work Phone: [phone] Mobile Phone: [phone] Relation: Daughter    Allergies: Bactrim and Horse-derived products  Chief Complaint  Patient presents with  . Medical Management of Chronic Issues    HPI: Patient is 81 y.o. male who is being seen for routine issues.  Past Medical History  Diagnosis Date  . CAD (coronary artery disease)     s/p NSTEMI and BMS stent diagonal07/09;  Lexiscan Myoview 9/13:  EF 62%, no ischemia  . AS (aortic stenosis)     a.  Echo 2009 mean gradient 87mm HG. AVA 2.18;  b.  Echo 4/11 mild AS mean gradient 66mm HG;  c.  Echo 04/2011: Mild LVH, EF 55-97%, grade 1 diastolic dysfunction, mild aortic stenosis, mean gradient 14, mild LAE;  d. Echo 9/13: mod LVH, EF 50-55%, mild to mod AS, mean gradient 17 mmHg  . AF (paroxysmal atrial fibrillation)     Holter monitor 2.5 sec pauses  . Fatigue     CPX 11/09 VO2  14.4 (75% predicte)d, slope 35.  O2 pulse normal.  VO2 corrected for body weight 17.6.  Marland Kitchen Hypothyroidism   . Hyperlipidemia   . History of prostate cancer   . Allergic rhinitis   . DM (diabetes mellitus)     type II diet controlled  . Shingles   . Left carotid stenosis   . Vitamin B 12 deficiency   . Anemia   . Osteoarthritis   . Nephrolithiasis   . Obesity   . OSA (obstructive sleep apnea)     AHI 15 PSG 09/06/08  . Hx of colonoscopy   . Stroke   .  Anxiety and depression   . PMR (polymyalgia rheumatica) 05/29/2012  . Hypogonadism male 05/29/2012  . Hyperparathyroidism 05/29/2012  . TIA (transient ischemic attack) 05/29/2012  . CKD (chronic kidney disease) stage 4, GFR 15-29 ml/min 05/29/2012  . COPD (chronic obstructive pulmonary disease) 05/29/2012  . Myocardial infarction   . Cancer     prostate ca history    Past Surgical History  Procedure Laterality Date  . Appendectomy    . Hernia repair    . Right carpal tunnel release    . Lumbar spine surgery    . Cystectomy      neck  . Prostatectomy    . Vasectomy    . Total knee arthroplasty      right  . Cholecystectomy        Medication List       This list is accurate as of: 02/05/15 11:59 PM.  Always use your most recent med list.               albuterol 108 (90 BASE) MCG/ACT inhaler  Commonly known as:  PROVENTIL HFA;VENTOLIN HFA  Inhale 2 puffs into the lungs every 6 (six) hours as needed for wheezing or shortness of breath.     buPROPion 300 MG 24 hr tablet  Commonly known  as:  WELLBUTRIN XL  Take 300 mg by mouth every morning.     clopidogrel 75 MG tablet  Commonly known as:  PLAVIX  Take 75 mg by mouth every evening.     folic acid 1 MG tablet  Commonly known as:  FOLVITE  Take 1 mg by mouth every morning.     guaiFENesin 600 MG 12 hr tablet  Commonly known as:  MUCINEX  Take by mouth 2 (two) times daily.     HYDROcodone-acetaminophen 10-325 MG per tablet  Commonly known as:  NORCO  Take 1 tablet by mouth every 4 (four) hours as needed for severe pain.     ipratropium-albuterol 0.5-2.5 (3) MG/3ML Soln  Commonly known as:  DUONEB  Take 3 mLs by nebulization 4 (four) times daily.     levothyroxine 137 MCG tablet  Commonly known as:  SYNTHROID, LEVOTHROID  Take 137 mcg by mouth every evening. Before dinner     methotrexate 2.5 MG tablet  Commonly known as:  RHEUMATREX  Take 10 mg by mouth once a week. Takes 4 tablets weekly on Wednesday.    Caution:Chemotherapy. Protect from light.     multivitamin with minerals Tabs tablet  Take 1 tablet by mouth every morning.     pantoprazole 40 MG tablet  Commonly known as:  PROTONIX  Take 40 mg by mouth every morning.     polyethylene glycol packet  Commonly known as:  MIRALAX / GLYCOLAX  Take 17 g by mouth daily.     risperiDONE 1 MG tablet  Commonly known as:  RISPERDAL  Take 1 mg by mouth every evening. Give daily at 5pm     sertraline 100 MG tablet  Commonly known as:  ZOLOFT  Take 100 mg by mouth at bedtime.     SYSTANE BALANCE OP  Apply 2 drops to eye 2 (two) times daily.     traMADol 50 MG tablet  Commonly known as:  ULTRAM  Take 50 mg by mouth 2 (two) times daily.     vitamin C 500 MG tablet  Commonly known as:  ASCORBIC ACID  Take 500 mg by mouth at bedtime.        No orders of the defined types were placed in this encounter.    Immunization History  Administered Date(s) Administered  . Influenza Split 09/03/2012  . Influenza,inj,Quad PF,36+ Mos 09/23/2013  . Influenza-Unspecified 10/05/2011, 10/12/2014  . Pneumococcal Conjugate-13 04/25/2011, 09/23/2013  . Tdap 04/24/2012    History  Substance Use Topics  . Smoking status: Former Smoker -- 0.50 packs/day for 25 years    Types: Cigarettes    Quit date: 12/25/1958  . Smokeless tobacco: Former Systems developer    Types: Snuff, [name]    Quit date: 08/06/1970     Comment: quit in 1950s  . Alcohol Use: No    Review of Systems  DATA OBTAINED: from  nurse, medical record, GENERAL:  no fevers, fatigue, appetite changes SKIN: upper extremity and trunk bruising, no accidents known HEENT: chronic conjunctivitis RESPIRATORY: No cough, wheezing, SOB CARDIAC: No chest pain, palpitations, lower extremity edema  GI: No abdominal pain, No N/V/D or constipation, No heartburn or reflux  GU: No dysuria, frequency or urgency, or incontinence  MUSCULOSKELETAL: No unrelieved bone/joint pain NEUROLOGIC: No headache,  dizziness  PSYCHIATRIC: No overt anxiety or sadness  Filed Vitals:   02/08/15 1821  BP: 132/82  Pulse: 69  Temp: 97.9 F (36.6 C)  Resp: 16    Physical Exam  GENERAL APPEARANCE: Alert,min conversant, No acute distress  SKIN:large confluent bruising over upper extremities and trunk HEENT: mild d/c B inner [place] RESPIRATORY: Breathing is even, unlabored. Lung sounds are clear   CARDIOVASCULAR: Heart RRR no murmurs, rubs or gallops. No peripheral edema  GASTROINTESTINAL: Abdomen is soft, non-tender, not distended w/ normal bowel sounds.  GENITOURINARY: Bladder non tender, not distended  MUSCULOSKELETAL: No abnormal joints or musculature NEUROLOGIC: Cranial nerves 2-12 grossly intact. PSYCHIATRIC: no behavioral issues  Patient Active Problem List   Diagnosis Date Noted  . Bruising 02/07/2015  . Loss of weight 01/16/2015  . Thirst 01/16/2015  . Hypokalemia 01/05/2015  . Laceration 10/05/2014  . Bilateral conjunctivitis 09/14/2014  . DM type 2, uncontrolled, with renal complications 94/80/1655  . Anemia in chronic renal disease 07/08/2014  . Acute posthemorrhagic anemia 07/07/2014  . Hip fracture, right 06/23/2014  . Skin tear of upper arm without complication 37/48/2707  . Unspecified constipation 06/23/2014  . Cough 06/17/2014  . Coronary atherosclerosis of native coronary artery 06/03/2014  . Macrocytosis 05/13/2014  . Conjunctivitis unspecified 05/07/2014  . Orthostatic hypotension 05/02/2014  . Sinusitis 05/02/2014  . Acute encephalopathy 05/02/2014  . Lethargy 04/29/2014  . Failure to thrive 04/29/2014  . HCAP (healthcare-associated pneumonia) 03/12/2014  . Acute respiratory failure with hypoxia 03/12/2014  . PMR (polymyalgia rheumatica) 03/12/2014  . Dementia with behavioral disturbance 01/09/2014  . Insomnia 09/23/2013  . Hypogonadism male 05/29/2012  . Hyperparathyroidism 05/29/2012  . CKD (chronic kidney disease) stage 4, GFR 15-29 ml/min 05/29/2012  .  COPD (chronic obstructive pulmonary disease) 05/29/2012  . OSA (obstructive sleep apnea) 05/29/2012  . Chronic steroid use 05/29/2012  . Depression   . AS (aortic stenosis)   . AF (paroxysmal atrial fibrillation)   . HYPERTENSION, BENIGN 04/07/2010  . RECTAL BLEEDING 07/21/2008  . PROSTATE CANCER, HX OF 06/20/2008  . VITAMIN B12 DEFICIENCY 06/17/2008  . BACK PAIN 06/15/2008  . Hypothyroidism 04/21/2008  . HYPERLIPIDEMIA 04/21/2008  . Anemia 04/21/2008    CBC    Component Value Date/Time   WBC 9.0 05/01/2014 0426   RBC 3.93* 05/01/2014 0426   RBC 3.06* 06/11/2008 2010   HGB 13.2 05/01/2014 0426   HCT 40.6 05/01/2014 0426   PLT 322 05/01/2014 0426   MCV 103.3* 05/01/2014 0426   LYMPHSABS 1.7 04/29/2014 0834   MONOABS 1.0 04/29/2014 0834   EOSABS 0.3 04/29/2014 0834   BASOSABS 0.0 04/29/2014 0834    CMP     Component Value Date/Time   NA 139 05/01/2014 0426   K 4.2 05/01/2014 0426   CL 103 05/01/2014 0426   CO2 21 05/01/2014 0426   GLUCOSE 118* 05/01/2014 0426   BUN 11 05/01/2014 0426   CREATININE 1.25 05/01/2014 0426   CALCIUM 8.8 05/01/2014 0426   PROT 6.4 04/29/2014 0834   ALBUMIN 3.4* 04/29/2014 0834   AST 31 04/29/2014 0834   ALT 22 04/29/2014 0834   ALKPHOS 68 04/29/2014 0834   BILITOT 0.5 04/29/2014 0834   GFRNONAA 49* 05/01/2014 0426   GFRAA 57* 05/01/2014 0426    Assessment and Plan  Dementia with behavioral disturbance There is reported concerns of family but they are are usually minor. I think this is the driver of all other problems, the progressiveness of dementia and need to control behavoir with meds. The meds that could make him sleepy are at night appopriate . lt may be possible to decrease some without losing control of behavoir?   Orthostatic hypotension Continue midodrine 5 mg TID; with  renal fx may be able to increase but better to leave it here   AF (paroxysmal atrial fibrillation) Rate controlled without meds;pt is on plavix but  recently has had some extensive bruising so that may need to be changed to ASA or just d/c   COPD (chronic obstructive pulmonary disease) No recent exacerbation;pt continues stable on duoneb and guafenisin   DM type 2, uncontrolled, with renal complications Last M4Q 5.5 in 10/2014 on no meds   Hypothyroidism On synthroid 137 mcg ; with TSH in 10/2014 8.17; repeat TSH -will probably need to inc synthroid   PMR (polymyalgia rheumatica) Chronic and stable on methotrexate   Bilateral conjunctivitis Pt has been treated with multiple courses of garamycin or cipro with good response but recurs and nurses admit in am , mild crust only;could be allergic/ will start patenol and follow   Bruising Reported as non traumatic but that is really hard to believe;strong consideration to d/c plavix     Hennie Duos, MD

## 2015-02-07 ENCOUNTER — Encounter: Payer: Self-pay | Admitting: Internal Medicine

## 2015-02-07 DIAGNOSIS — T148XXA Other injury of unspecified body region, initial encounter: Secondary | ICD-10-CM | POA: Insufficient documentation

## 2015-02-08 ENCOUNTER — Encounter: Payer: Self-pay | Admitting: Internal Medicine

## 2015-02-08 NOTE — Assessment & Plan Note (Signed)
Continue midodrine 5 mg TID; with renal fx may be able to increase but better to leave it here

## 2015-02-08 NOTE — Assessment & Plan Note (Signed)
In 12/2014 GFR 53 and BUN 19/Cr 1.3 so some improvement from prior

## 2015-02-08 NOTE — Assessment & Plan Note (Signed)
Reported as non traumatic but that is really hard to believe;strong consideration to d/c plavix

## 2015-02-08 NOTE — Assessment & Plan Note (Addendum)
Rate controlled without meds;pt is on plavix but recently has had some extensive bruising so that may need to be changed to ASA or just d/c

## 2015-02-08 NOTE — Assessment & Plan Note (Signed)
No recent exacerbation;pt continues stable on duoneb and guafenisin

## 2015-02-08 NOTE — Assessment & Plan Note (Signed)
Pt has been treated with multiple courses of garamycin or cipro with good response but recurs and nurses admit in am , mild crust only;could be allergic/ will start patenol and follow

## 2015-02-08 NOTE — Assessment & Plan Note (Signed)
Chronic and stable on methotrexate

## 2015-02-08 NOTE — Assessment & Plan Note (Addendum)
There is reported concerns of family but they are are usually minor. Maybe plan a family meeting.  I think this is the driver of all other problems, the progressiveness of dementia and need to control behavoir with meds. The meds that could make him sleepy are at night appopriate . lt may be possible to decrease some without losing control of behavoir?

## 2015-02-08 NOTE — Assessment & Plan Note (Addendum)
Last a1c 5.5 in 10/2014 on no meds

## 2015-02-08 NOTE — Assessment & Plan Note (Addendum)
On synthroid 137 mcg ; with TSH in 10/2014 8.17; repeat TSH -will probably need to inc synthroid

## 2015-02-08 NOTE — Assessment & Plan Note (Signed)
Ca 7.8;check Vit D, consider repletion;hx of hyperparathyroidism

## 2015-02-10 ENCOUNTER — Encounter: Payer: Self-pay | Admitting: Internal Medicine

## 2015-02-10 ENCOUNTER — Non-Acute Institutional Stay (SKILLED_NURSING_FACILITY): Payer: Medicare Other | Admitting: Internal Medicine

## 2015-02-10 DIAGNOSIS — K1120 Sialoadenitis, unspecified: Secondary | ICD-10-CM

## 2015-02-10 NOTE — Progress Notes (Signed)
MRN: [id number] Name: [name]  Sex: male Age: 81 y.o. DOB: 1923/01/10  [place] #: [name] Facility/Room:411 Level Of Care: SNF Provider: Inocencio Homes D Emergency Contacts: Extended Emergency Contact Information Primary Emergency Contact: Kamer,Brazos Address: [address]          [place], [place] [address] [name] of [place] Phone: [phone] Work Phone: [phone] Mobile Phone: [phone] Relation: Son Secondary Emergency Contact: Brown,Vicky Address: [address] [place]          [place], [place] [address] [place] of [place] Phone: [phone] Work Phone: [phone] Mobile Phone: [phone] Relation: Daughter  Code Status: FULL  Allergies: Bactrim and Horse-derived products  Chief Complaint  Patient presents with  . Acute Visit    HPI: Patient is 81 y.o. male who nursing noted had a swelling ar L jaw , new today.  Past Medical History  Diagnosis Date  . CAD (coronary artery disease)     s/p NSTEMI and BMS stent diagonal07/09;  Lexiscan Myoview 9/13:  EF 62%, no ischemia  . AS (aortic stenosis)     a.  Echo 2009 mean gradient 59mm HG. AVA 2.18;  b.  Echo 4/11 mild AS mean gradient 29mm HG;  c.  Echo 04/2011: Mild LVH, EF 10-62%, grade 1 diastolic dysfunction, mild aortic stenosis, mean gradient 14, mild LAE;  d. Echo 9/13: mod LVH, EF 50-55%, mild to mod AS, mean gradient 17 mmHg  . AF (paroxysmal atrial fibrillation)     Holter monitor 2.5 sec pauses  . Fatigue     CPX 11/09 VO2  14.4 (75% predicte)d, slope 35.  O2 pulse normal.  VO2 corrected for body weight 17.6.  Marland Kitchen Hypothyroidism   . Hyperlipidemia   . History of prostate cancer   . Allergic rhinitis   . DM (diabetes mellitus)     type II diet controlled  . Shingles   . Left carotid stenosis   . Vitamin B 12 deficiency   . Anemia   . Osteoarthritis   . Nephrolithiasis   . Obesity   . OSA (obstructive sleep apnea)     AHI 15 PSG 09/06/08  . Hx of colonoscopy   .  Stroke   . Anxiety and depression   . PMR (polymyalgia rheumatica) 05/29/2012  . Hypogonadism male 05/29/2012  . Hyperparathyroidism 05/29/2012  . TIA (transient ischemic attack) 05/29/2012  . CKD (chronic kidney disease) stage 4, GFR 15-29 ml/min 05/29/2012  . COPD (chronic obstructive pulmonary disease) 05/29/2012  . Myocardial infarction   . Cancer     prostate ca history    Past Surgical History  Procedure Laterality Date  . Appendectomy    . Hernia repair    . Right carpal tunnel release    . Lumbar spine surgery    . Cystectomy      neck  . Prostatectomy    . Vasectomy    . Total knee arthroplasty      right  . Cholecystectomy        Medication List       This list is accurate as of: 02/10/15  1:39 PM.  Always use your most recent med list.               albuterol 108 (90 BASE) MCG/ACT inhaler  Commonly known as:  PROVENTIL HFA;VENTOLIN HFA  Inhale 2 puffs into the lungs every 6 (six) hours as needed for wheezing or shortness of breath.     buPROPion 300 MG 24  hr tablet  Commonly known as:  WELLBUTRIN XL  Take 300 mg by mouth every morning.     clopidogrel 75 MG tablet  Commonly known as:  PLAVIX  Take 75 mg by mouth every evening.     folic acid 1 MG tablet  Commonly known as:  FOLVITE  Take 1 mg by mouth every morning.     guaiFENesin 600 MG 12 hr tablet  Commonly known as:  MUCINEX  Take by mouth 2 (two) times daily.     HYDROcodone-acetaminophen 10-325 MG per tablet  Commonly known as:  NORCO  Take 1 tablet by mouth every 4 (four) hours as needed for severe pain.     ipratropium-albuterol 0.5-2.5 (3) MG/3ML Soln  Commonly known as:  DUONEB  Take 3 mLs by nebulization 4 (four) times daily.     levothyroxine 137 MCG tablet  Commonly known as:  SYNTHROID, LEVOTHROID  Take 137 mcg by mouth every evening. Before dinner     methotrexate 2.5 MG tablet  Commonly known as:  RHEUMATREX  Take 10 mg by mouth once a week. Takes 4 tablets weekly on Wednesday.    Caution:Chemotherapy. Protect from light.     multivitamin with minerals Tabs tablet  Take 1 tablet by mouth every morning.     pantoprazole 40 MG tablet  Commonly known as:  PROTONIX  Take 40 mg by mouth every morning.     polyethylene glycol packet  Commonly known as:  MIRALAX / GLYCOLAX  Take 17 g by mouth daily.     risperiDONE 1 MG tablet  Commonly known as:  RISPERDAL  Take 1 mg by mouth every evening. Give daily at 5pm     sertraline 100 MG tablet  Commonly known as:  ZOLOFT  Take 100 mg by mouth at bedtime.     SYSTANE BALANCE OP  Apply 2 drops to eye 2 (two) times daily.     traMADol 50 MG tablet  Commonly known as:  ULTRAM  Take 50 mg by mouth 2 (two) times daily.     vitamin C 500 MG tablet  Commonly known as:  ASCORBIC ACID  Take 500 mg by mouth at bedtime.        No orders of the defined types were placed in this encounter.    Immunization History  Administered Date(s) Administered  . Influenza Split 09/03/2012  . Influenza,inj,Quad PF,36+ Mos 09/23/2013  . Influenza-Unspecified 10/05/2011, 10/12/2014  . Pneumococcal Conjugate-13 04/25/2011, 09/23/2013  . Tdap 04/24/2012    History  Substance Use Topics  . Smoking status: Former Smoker -- 0.50 packs/day for 25 years    Types: Cigarettes    Quit date: 12/25/1958  . Smokeless tobacco: Former Systems developer    Types: Snuff, [name]    Quit date: 08/06/1970     Comment: quit in 1950s  . Alcohol Use: No    Review of Systems  DATA OBTAINED: from patient, nurse GENERAL:  no fevers, fatigue, appetite changes SKIN: No itching, rash HEENT: swelling L jaw; pt admits was worse with eating RESPIRATORY: No cough, wheezing, SOB CARDIAC: No chest pain, palpitations, lower extremity edema  GI: No abdominal pain, No N/V/D or constipation, No heartburn or reflux  GU: No dysuria, frequency or urgency, or incontinence  MUSCULOSKELETAL: No unrelieved bone/joint pain NEUROLOGIC: No headache, dizziness  PSYCHIATRIC: No  overt anxiety or sadness  Filed Vitals:   02/10/15 1338  BP: 125/67  Pulse: 64  Temp: 97 F (36.1 C)  Resp: 18  Physical Exam  GENERAL APPEARANCE: Alert, conversant, No acute distress  SKIN: No diaphoresis rash HEENT: - very firm swelling above and below angle of the L jaw, not hot, not red, some tenderness; inside of mouth unremarkable RESPIRATORY: Breathing is even, unlabored. Lung sounds are clear   CARDIOVASCULAR: Heart RRR no murmurs, rubs or gallops. No peripheral edema  GASTROINTESTINAL: Abdomen is soft, non-tender, not distended w/ normal bowel sounds.  GENITOURINARY: Bladder non tender, not distended  MUSCULOSKELETAL: No abnormal joints or musculature NEUROLOGIC: Cranial nerves 2-12 grossly intact. Moves all extremities PSYCHIATRIC: Mood and affect appropriate to situation, no behavioral issues  Patient Active Problem List   Diagnosis Date Noted  . Hypocalcemia 02/08/2015  . Bruising 02/07/2015  . Loss of weight 01/16/2015  . Thirst 01/16/2015  . Hypokalemia 01/05/2015  . Laceration 10/05/2014  . Bilateral conjunctivitis 09/14/2014  . DM type 2, uncontrolled, with renal complications 96/22/2979  . Anemia in chronic renal disease 07/08/2014  . Acute posthemorrhagic anemia 07/07/2014  . Hip fracture, right 06/23/2014  . Skin tear of upper arm without complication 89/21/1941  . Unspecified constipation 06/23/2014  . Cough 06/17/2014  . Coronary atherosclerosis of native coronary artery 06/03/2014  . Macrocytosis 05/13/2014  . Conjunctivitis unspecified 05/07/2014  . Orthostatic hypotension 05/02/2014  . Sinusitis 05/02/2014  . Acute encephalopathy 05/02/2014  . Lethargy 04/29/2014  . Failure to thrive 04/29/2014  . HCAP (healthcare-associated pneumonia) 03/12/2014  . Acute respiratory failure with hypoxia 03/12/2014  . PMR (polymyalgia rheumatica) 03/12/2014  . Dementia with behavioral disturbance 01/09/2014  . Insomnia 09/23/2013  . Hypogonadism male  05/29/2012  . Hyperparathyroidism 05/29/2012  . CKD (chronic kidney disease) stage 4, GFR 15-29 ml/min 05/29/2012  . COPD (chronic obstructive pulmonary disease) 05/29/2012  . OSA (obstructive sleep apnea) 05/29/2012  . Chronic steroid use 05/29/2012  . Depression   . AS (aortic stenosis)   . AF (paroxysmal atrial fibrillation)   . HYPERTENSION, BENIGN 04/07/2010  . RECTAL BLEEDING 07/21/2008  . PROSTATE CANCER, HX OF 06/20/2008  . VITAMIN B12 DEFICIENCY 06/17/2008  . BACK PAIN 06/15/2008  . Hypothyroidism 04/21/2008  . HYPERLIPIDEMIA 04/21/2008  . Anemia 04/21/2008    CBC    Component Value Date/Time   WBC 9.0 05/01/2014 0426   RBC 3.93* 05/01/2014 0426   RBC 3.06* 06/11/2008 2010   HGB 13.2 05/01/2014 0426   HCT 40.6 05/01/2014 0426   PLT 322 05/01/2014 0426   MCV 103.3* 05/01/2014 0426   LYMPHSABS 1.7 04/29/2014 0834   MONOABS 1.0 04/29/2014 0834   EOSABS 0.3 04/29/2014 0834   BASOSABS 0.0 04/29/2014 0834    CMP     Component Value Date/Time   NA 139 05/01/2014 0426   K 4.2 05/01/2014 0426   CL 103 05/01/2014 0426   CO2 21 05/01/2014 0426   GLUCOSE 118* 05/01/2014 0426   BUN 11 05/01/2014 0426   CREATININE 1.25 05/01/2014 0426   CALCIUM 8.8 05/01/2014 0426   PROT 6.4 04/29/2014 0834   ALBUMIN 3.4* 04/29/2014 0834   AST 31 04/29/2014 0834   ALT 22 04/29/2014 0834   ALKPHOS 68 04/29/2014 0834   BILITOT 0.5 04/29/2014 0834   GFRNONAA 49* 05/01/2014 0426   GFRAA 57* 05/01/2014 0426    Assessment and Plan  No problem-specific assessment & plan notes found for this encounter.   Hennie Duos, MD

## 2015-02-10 NOTE — Assessment & Plan Note (Signed)
Vs other-need consider lymph node if this is not resolving in a week. Can't use NSAIDs, pt is on plavix, can massage, can start Augmentin 875 BID for a week.

## 2015-02-11 ENCOUNTER — Non-Acute Institutional Stay (SKILLED_NURSING_FACILITY): Payer: Medicare Other | Admitting: Internal Medicine

## 2015-02-11 DIAGNOSIS — D72829 Elevated white blood cell count, unspecified: Secondary | ICD-10-CM

## 2015-02-11 DIAGNOSIS — H109 Unspecified conjunctivitis: Secondary | ICD-10-CM

## 2015-02-11 DIAGNOSIS — T148 Other injury of unspecified body region: Secondary | ICD-10-CM

## 2015-02-11 DIAGNOSIS — E038 Other specified hypothyroidism: Secondary | ICD-10-CM | POA: Diagnosis not present

## 2015-02-11 DIAGNOSIS — K1120 Sialoadenitis, unspecified: Secondary | ICD-10-CM | POA: Diagnosis not present

## 2015-02-11 DIAGNOSIS — T148XXA Other injury of unspecified body region, initial encounter: Secondary | ICD-10-CM

## 2015-02-11 NOTE — Progress Notes (Signed)
Patient ID: [name], male   DOB: [birth year], 81 y.o.   MRN: [id number]   this is an acute visit.  Level care skilled.  [facility].   Chief Complaint  Patient presents with  .  acute visit secondary to leukocytosis-follow-up hypothyroidism-    HPI: Patient is 81 y.o. male who is being seen for elevated white count---also elevated TSH.  Patient did have a routine lab done earlier this week which was significant for an elevated white count of 28.3-neutrophil percentage elevated at 89.8 with absolute neutrophils 25.4-he has been afebrile Previous white counts per chart review have been in the 8000 range-.  He was started on Augmentin yesterday by Dr. Sheppard Coil secondary to concerns about possible  sialadenitis on the left.   He appears to be tolerating this well this appears to be stable actually looks to be doing well today and I saw him inthe wheelchair earlier today going to lunch he is now currently lying in bed comfortably does not complain of any increased shortness breath or cough-apparently there've been a note of some brown sputum several days ago but nursing staff today has not noted this.  He does not really have any complaints today and nursing staff has not noted anything out of the ordinary.  Lab done earlier this week also shows albumin of 1.9 he has lost weight here and appears about 10 pounds the past 30 days.  This was assessed by  Dr. Sheppard Coil last week-is thought his dementia which is progressing is probably contributing to this he is on supplements and is followed by dietary he is on med Pass 3 times a day--he is on a thickened liquid diet.  Dietary has initiated fortified foods  He also has a history of hypothyroidism is on Synthroid 137 g a day TSH done today is mildly elevated at 6.01    Past Medical History  Diagnosis Date  . CAD (coronary artery disease)     s/p NSTEMI and BMS stent diagonal07/09;  Lexiscan Myoview 9/13:  EF 62%, no ischemia   . AS (aortic stenosis)     a.  Echo 2009 mean gradient 32mm HG. AVA 2.18;  b.  Echo 4/11 mild AS mean gradient 87mm HG;  c.  Echo 04/2011: Mild LVH, EF 09-73%, grade 1 diastolic dysfunction, mild aortic stenosis, mean gradient 14, mild LAE;  d. Echo 9/13: mod LVH, EF 50-55%, mild to mod AS, mean gradient 17 mmHg  . AF (paroxysmal atrial fibrillation)     Holter monitor 2.5 sec pauses  . Fatigue     CPX 11/09 VO2  14.4 (75% predicte)d, slope 35.  O2 pulse normal.  VO2 corrected for body weight 17.6.  Marland Kitchen Hypothyroidism   . Hyperlipidemia   . History of prostate cancer   . Allergic rhinitis   . DM (diabetes mellitus)     type II diet controlled  . Shingles   . Left carotid stenosis   . Vitamin B 12 deficiency   . Anemia   . Osteoarthritis   . Nephrolithiasis   . Obesity   . OSA (obstructive sleep apnea)     AHI 15 PSG 09/06/08  . Hx of colonoscopy   . Stroke   . Anxiety and depression   . PMR (polymyalgia rheumatica) 05/29/2012  . Hypogonadism male 05/29/2012  . Hyperparathyroidism 05/29/2012  . TIA (transient ischemic attack) 05/29/2012  . CKD (chronic kidney disease) stage 4, GFR 15-29 ml/min 05/29/2012  . COPD (chronic obstructive pulmonary disease)  05/29/2012  . Myocardial infarction   . Cancer     prostate ca history    Past Surgical History  Procedure Laterality Date  . Appendectomy    . Hernia repair    . Right carpal tunnel release    . Lumbar spine surgery    . Cystectomy      neck  . Prostatectomy    . Vasectomy    . Total knee arthroplasty      right  . Cholecystectomy        Medication List                       albuterol 108 (90 BASE) MCG/ACT inhaler  Commonly known as:  PROVENTIL HFA;VENTOLIN HFA  Inhale 2 puffs into the lungs every 6 (six) hours as needed for wheezing or shortness of breath.     buPROPion 300 MG 24 hr tablet  Commonly known as:  WELLBUTRIN XL  Take 300 mg by mouth every morning.     clopidogrel 75 MG tablet  Commonly known as:   PLAVIX  Take 75 mg by mouth every evening.     folic acid 1 MG tablet  Commonly known as:  FOLVITE  Take 1 mg by mouth every morning.     guaiFENesin 600 MG 12 hr tablet  Commonly known as:  MUCINEX  Take by mouth 2 (two) times daily.     HYDROcodone-acetaminophen 10-325 MG per tablet  Commonly known as:  NORCO  Take 1 tablet by mouth every 4 (four) hours as needed for severe pain.     ipratropium-albuterol 0.5-2.5 (3) MG/3ML Soln  Commonly known as:  DUONEB  Take 3 mLs by nebulization 4 (four) times daily.     levothyroxine 137 MCG tablet  Commonly known as:  SYNTHROID, LEVOTHROID  Take 137 mcg by mouth every evening. Before dinner     methotrexate 2.5 MG tablet  Commonly known as:  RHEUMATREX  Take 10 mg by mouth once a week. Takes 4 tablets weekly on Wednesday.   Caution:Chemotherapy. Protect from light.     multivitamin with minerals Tabs tablet  Take 1 tablet by mouth every morning.     pantoprazole 40 MG tablet  Commonly known as:  PROTONIX  Take 40 mg by mouth every morning.     polyethylene glycol packet  Commonly known as:  MIRALAX / GLYCOLAX  Take 17 g by mouth daily.     risperiDONE 1 MG tablet  Commonly known as:  RISPERDAL  Take 1 mg by mouth every evening. Give daily at 5pm     sertraline 100 MG tablet  Commonly known as:  ZOLOFT  Take 100 mg by mouth at bedtime.     SYSTANE BALANCE OP  Apply 2 drops to eye 2 (two) times daily.     traMADol 50 MG tablet  Commonly known as:  ULTRAM  Take 50 mg by mouth 2 (two) times daily.     vitamin C 500 MG tablet  Commonly known as:  ASCORBIC ACID  Take 500 mg by mouth at bedtime.     Augmentin 875 mg twice a day last dose on 2015/03/14    No orders of the defined types were placed in this encounter.    Immunization History  Administered Date(s) Administered  . Influenza Split 09/03/2012  . Influenza,inj,Quad PF,36+ Mos 09/23/2013  . Influenza-Unspecified 10/05/2011, 10/12/2014  . Pneumococcal  Conjugate-13 04/25/2011, 09/23/2013  . Tdap 04/24/2012  History  Substance Use Topics  . Smoking status: Former Smoker -- 0.50 packs/day for 25 years    Types: Cigarettes    Quit date: 12/25/1958  . Smokeless tobacco: Former Systems developer    Types: Snuff, [name]    Quit date: 08/06/1970     Comment: quit in 1950s  . Alcohol Use: No    Review of Systems  DATA OBTAINED: from  nurse, medical record, GENERAL:  no fevers, fatigue, appetite changesProtect continues to be spotty  SKIN: upper extremity and trunk bruising, no accidents known--has erythematous area small open area sacral area this is followed by wound care HEENT: chronic conjunctivitis--appears to be somewhat improved today on Patanol drops Has some edema mild erythema left jaw line  RESPIRATORY: Intermittent  Cough--no, wheezing, SOB CARDIAC: No chest pain, palpitations, lower extremity edema  GI: No abdominal pain, No N/V/D or constipation, No heartburn or reflux  GU: No dysuria, frequency or urgency, or incontinence  MUSCULOSKELETAL: No unrelieved bone/joint pain NEUROLOGIC: No headache, dizziness  PSYCHIATRIC: No overt anxiety or sadness                      Physical Exam Temperature 96.2 pulse 90 respirations 18 blood pressure 131/74  GENERAL APPEARANCE: Alert,min conversant, No acute distress  SKIN:large confluent bruising over upper extremities and trunk--although this appears to be slowly resolving  area of erythema sacral area there is no firmness here and a small open area superior to that this does not appear to be overtly tender there is no drainage bleeding or sign of overt infection no firmness  HEENT: I do note lateral left jaw line area there is an area of pale erythema and swelling this appears to be somewhat comparable to what Dr. Sheppard Coil saw yesterday there is some tenderness to palpation-oropharynx is clear mucous membranes moist His eyes appear to be somewhat improved clearer I do not see exudate  today  RESPIRATORY: Breathing is even, unlabored. Lung sounds are clear  CARDIOVASCULAR: Heart RRR--distant heart sounds  no murmurs, rubs or gallops. No peripheral edema  GASTROINTESTINAL: Abdomen is soft, non-tender, not distended w/ normal bowel sounds Rectal-appears to have a small amount of stool in diaper-this is soft but not overtly loose stool.  GENITOURINARY: Bladder non tender, not distended  MUSCULOSKELETAL: No abnormal joints or musculature NEUROLOGIC: Cranial nerves 2-12 grossly intact. PSYCHIATRIC: no behavioral issues  Patient Active Problem List   Diagnosis Date Noted  . Bruising 02/07/2015  . Loss of weight 01/16/2015  . Thirst 01/16/2015  . Hypokalemia 01/05/2015  . Laceration 10/05/2014  . Bilateral conjunctivitis 09/14/2014  . DM type 2, uncontrolled, with renal complications 87/86/7672  . Anemia in chronic renal disease 07/08/2014  . Acute posthemorrhagic anemia 07/07/2014  . Hip fracture, right 06/23/2014  . Skin tear of upper arm without complication 09/47/0962  . Unspecified constipation 06/23/2014  . Cough 06/17/2014  . Coronary atherosclerosis of native coronary artery 06/03/2014  . Macrocytosis 05/13/2014  . Conjunctivitis unspecified 05/07/2014  . Orthostatic hypotension 05/02/2014  . Sinusitis 05/02/2014  . Acute encephalopathy 05/02/2014  . Lethargy 04/29/2014  . Failure to thrive 04/29/2014  . HCAP (healthcare-associated pneumonia) 03/12/2014  . Acute respiratory failure with hypoxia 03/12/2014  . PMR (polymyalgia rheumatica) 03/12/2014  . Dementia with behavioral disturbance 01/09/2014  . Insomnia 09/23/2013  . Hypogonadism male 05/29/2012  . Hyperparathyroidism 05/29/2012  . CKD (chronic kidney disease) stage 4, GFR 15-29 ml/min 05/29/2012  . COPD (chronic obstructive pulmonary disease) 05/29/2012  . OSA (  obstructive sleep apnea) 05/29/2012  . Chronic steroid use 05/29/2012  . Depression   . AS (aortic stenosis)   . AF (paroxysmal atrial  fibrillation)   . HYPERTENSION, BENIGN 04/07/2010  . RECTAL BLEEDING 07/21/2008  . PROSTATE CANCER, HX OF 06/20/2008  . VITAMIN B12 DEFICIENCY 06/17/2008  . BACK PAIN 06/15/2008  . Hypothyroidism 04/21/2008  . HYPERLIPIDEMIA 04/21/2008  . Anemia 04/21/2008    CBC  02/08/2015.  WBC 28.3 hemoglobin 8.8 platelets 344.  Sodium 142 potassium 3.8 BUN 29 creatinine 1.5.      Component Value Date/Time   WBC 9.0 05/01/2014 0426   RBC 3.93* 05/01/2014 0426   RBC 3.06* 06/11/2008 2010   HGB 13.2 05/01/2014 0426   HCT 40.6 05/01/2014 0426   PLT 322 05/01/2014 0426   MCV 103.3* 05/01/2014 0426   LYMPHSABS 1.7 04/29/2014 0834   MONOABS 1.0 04/29/2014 0834   EOSABS 0.3 04/29/2014 0834   BASOSABS 0.0 04/29/2014 0834    CMP     Component Value Date/Time   NA 139 05/01/2014 0426   K 4.2 05/01/2014 0426   CL 103 05/01/2014 0426   CO2 21 05/01/2014 0426   GLUCOSE 118* 05/01/2014 0426   BUN 11 05/01/2014 0426   CREATININE 1.25 05/01/2014 0426   CALCIUM 8.8 05/01/2014 0426   PROT 6.4 04/29/2014 0834   ALBUMIN 3.4* 04/29/2014 0834   AST 31 04/29/2014 0834   ALT 22 04/29/2014 0834   ALKPHOS 68 04/29/2014 0834   BILITOT 0.5 04/29/2014 0834   GFRNONAA 49* 05/01/2014 0426   GFRAA 57* 05/01/2014 0426    Assessment and Plan Leukocytosis-unclear etiology but significantly elevated-this is a challenging finding---this was discussed with Dr. Sheppard Coil via phone-he does not show overt signs of infection here other than as stated previously of concerns for sialadenitis-will recheck this lab to ensure accuracy and see where it is trending-  Of note he has been started on antibiotic again for left jaw swelling erythema concerns for inflammationsalivary gland-  Dr. Sheppard Coil has noted this does not resolve consider lymph node etiology  Respiratory wise- he appears to be stable with a significant history of COPD and oxygen dependency again he is on an antibiotic started empirically for the  parotid issues--continues on Mucinex as well   Staff does not really report any diarrhea but will write an order to test any loose stool for C. Difficile  Continue to monitor for any signs of sepsis although he does not give that presentation today actually appears a bit more alert and talkative than I saw him last week     Hypothyroidism On synthroid 137 mcg recent TSH 6.07 will increase Synthroid to 150 g a day and recheck this in 4 weeks       Bilateral conjunctivitis  This actually appears to have improved --although talking with nursing tech at bedside apparently she has just cleaned his eyes and he still at times has exudate--this will warrant further monitoring   Bruising This appears to be slowly resolving on the upper arms-  [id number]-of note greater than 35 minutes spent assessing patient-discussing his status extensively with nursing staff-as well as with Dr. Sheppard Coil via phone-and coordinating and formulating a plan of care.  Of note greater than 50% of time spent coordinating plan of care

## 2015-02-12 ENCOUNTER — Non-Acute Institutional Stay (SKILLED_NURSING_FACILITY): Payer: Medicare Other | Admitting: Internal Medicine

## 2015-02-12 ENCOUNTER — Encounter: Payer: Self-pay | Admitting: Internal Medicine

## 2015-02-12 DIAGNOSIS — R627 Adult failure to thrive: Secondary | ICD-10-CM | POA: Diagnosis not present

## 2015-02-12 DIAGNOSIS — K1120 Sialoadenitis, unspecified: Secondary | ICD-10-CM

## 2015-02-12 DIAGNOSIS — D72829 Elevated white blood cell count, unspecified: Secondary | ICD-10-CM

## 2015-02-12 DIAGNOSIS — R400 Somnolence: Secondary | ICD-10-CM

## 2015-02-12 DIAGNOSIS — F0391 Unspecified dementia with behavioral disturbance: Secondary | ICD-10-CM | POA: Diagnosis not present

## 2015-02-12 DIAGNOSIS — F03918 Unspecified dementia, unspecified severity, with other behavioral disturbance: Secondary | ICD-10-CM

## 2015-02-12 DIAGNOSIS — I951 Orthostatic hypotension: Secondary | ICD-10-CM | POA: Diagnosis not present

## 2015-02-12 NOTE — Assessment & Plan Note (Signed)
Progressive

## 2015-02-12 NOTE — Assessment & Plan Note (Signed)
Not eating and drinking as consistantly

## 2015-02-12 NOTE — Assessment & Plan Note (Signed)
Episode today could be this problem

## 2015-02-12 NOTE — Progress Notes (Signed)
MRN: [id number] Name: [name]  Sex: male Age: 81 y.o. DOB: [birth year]  [place] #: [facility] Facility/Room: 411 Level Of Care: SNF Provider: Inocencio Homes D Emergency Contacts: Extended Emergency Contact Information Primary Emergency Contact: Campoverde,Shawan Address: [address]          [place], [place] [address] [name] of [place] Phone: [phone] Work Phone: [phone] Mobile Phone: [phone] Relation: Son Secondary Emergency Contact: Brown,Vicky Address: [address] [place]          [place], [place] [address] [place] Phone: [phone] Work Phone: [phone] Mobile Phone: [phone] Relation: Daughter  Code Status: FULL  Allergies: Bactrim and Horse-derived products  Chief Complaint  Patient presents with  . Acute Visit    HPI: Patient is 81 y.o. male who has dementia and FTT was found in his WC with decreased responsiveness. As he was layed down in the bed this improved. i was asked to assess pt.  Past Medical History  Diagnosis Date  . CAD (coronary artery disease)     s/p NSTEMI and BMS stent diagonal07/09;  Lexiscan Myoview 9/13:  EF 62%, no ischemia  . AS (aortic stenosis)     a.  Echo 2009 mean gradient 51mm HG. AVA 2.18;  b.  Echo 4/11 mild AS mean gradient 81mm HG;  c.  Echo 04/2011: Mild LVH, EF 50-93%, grade 1 diastolic dysfunction, mild aortic stenosis, mean gradient 14, mild LAE;  d. Echo 9/13: mod LVH, EF 50-55%, mild to mod AS, mean gradient 17 mmHg  . AF (paroxysmal atrial fibrillation)     Holter monitor 2.5 sec pauses  . Fatigue     CPX 11/09 VO2  14.4 (75% predicte)d, slope 35.  O2 pulse normal.  VO2 corrected for body weight 17.6.  Marland Kitchen Hypothyroidism   . Hyperlipidemia   . History of prostate cancer   . Allergic rhinitis   . DM (diabetes mellitus)     type II diet controlled  . Shingles   . Left carotid stenosis   . Vitamin B 12 deficiency   . Anemia   . Osteoarthritis   . Nephrolithiasis   .  Obesity   . OSA (obstructive sleep apnea)     AHI 15 PSG 09/06/08  . Hx of colonoscopy   . Stroke   . Anxiety and depression   . PMR (polymyalgia rheumatica) 05/29/2012  . Hypogonadism male 05/29/2012  . Hyperparathyroidism 05/29/2012  . TIA (transient ischemic attack) 05/29/2012  . CKD (chronic kidney disease) stage 4, GFR 15-29 ml/min 05/29/2012  . COPD (chronic obstructive pulmonary disease) 05/29/2012  . Myocardial infarction   . Cancer     prostate ca history    Past Surgical History  Procedure Laterality Date  . Appendectomy    . Hernia repair    . Right carpal tunnel release    . Lumbar spine surgery    . Cystectomy      neck  . Prostatectomy    . Vasectomy    . Total knee arthroplasty      right  . Cholecystectomy        Medication List       This list is accurate as of: 02/12/15  3:02 PM.  Always use your most recent med list.               albuterol 108 (90 BASE) MCG/ACT inhaler  Commonly known as:  PROVENTIL HFA;VENTOLIN HFA  Inhale 2 puffs into the lungs every 6 (  six) hours as needed for wheezing or shortness of breath.     buPROPion 300 MG 24 hr tablet  Commonly known as:  WELLBUTRIN XL  Take 300 mg by mouth every morning.     clopidogrel 75 MG tablet  Commonly known as:  PLAVIX  Take 75 mg by mouth every evening.     folic acid 1 MG tablet  Commonly known as:  FOLVITE  Take 1 mg by mouth every morning.     guaiFENesin 600 MG 12 hr tablet  Commonly known as:  MUCINEX  Take by mouth 2 (two) times daily.     HYDROcodone-acetaminophen 10-325 MG per tablet  Commonly known as:  NORCO  Take 1 tablet by mouth every 4 (four) hours as needed for severe pain.     ipratropium-albuterol 0.5-2.5 (3) MG/3ML Soln  Commonly known as:  DUONEB  Take 3 mLs by nebulization 4 (four) times daily.     levothyroxine 137 MCG tablet  Commonly known as:  SYNTHROID, LEVOTHROID  Take 137 mcg by mouth every evening. Before dinner     methotrexate 2.5 MG tablet  Commonly  known as:  RHEUMATREX  Take 10 mg by mouth once a week. Takes 4 tablets weekly on Wednesday.   Caution:Chemotherapy. Protect from light.     multivitamin with minerals Tabs tablet  Take 1 tablet by mouth every morning.     pantoprazole 40 MG tablet  Commonly known as:  PROTONIX  Take 40 mg by mouth every morning.     polyethylene glycol packet  Commonly known as:  MIRALAX / GLYCOLAX  Take 17 g by mouth daily.     risperiDONE 1 MG tablet  Commonly known as:  RISPERDAL  Take 1 mg by mouth every evening. Give daily at 5pm     sertraline 100 MG tablet  Commonly known as:  ZOLOFT  Take 100 mg by mouth at bedtime.     SYSTANE BALANCE OP  Apply 2 drops to eye 2 (two) times daily.     traMADol 50 MG tablet  Commonly known as:  ULTRAM  Take 50 mg by mouth 2 (two) times daily.     vitamin C 500 MG tablet  Commonly known as:  ASCORBIC ACID  Take 500 mg by mouth at bedtime.        No orders of the defined types were placed in this encounter.    Immunization History  Administered Date(s) Administered  . Influenza Split 09/03/2012  . Influenza,inj,Quad PF,36+ Mos 09/23/2013  . Influenza-Unspecified 10/05/2011, 10/12/2014  . Pneumococcal Conjugate-13 04/25/2011, 09/23/2013  . Tdap 04/24/2012    History  Substance Use Topics  . Smoking status: Former Smoker -- 0.50 packs/day for 25 years    Types: Cigarettes    Quit date: 12/25/1958  . Smokeless tobacco: Former Systems developer    Types: Snuff, [name]    Quit date: 08/06/1970     Comment: quit in 1950s  . Alcohol Use: No    Review of Systems  DATA OBTAINED: from patient, nurse, medical record, family member GENERAL:  no fevers, fatigue, appetite changes SKIN: No itching, rash HEENT: No complaint RESPIRATORY: No cough, wheezing, SOB CARDIAC: No chest pain, palpitations, lower extremity edema  GI: No abdominal pain, No N/V/D or constipation, No heartburn or reflux  GU: No dysuria, frequency or urgency, or incontinence   MUSCULOSKELETAL: No unrelieved bone/joint pain NEUROLOGIC: No headache, dizziness  PSYCHIATRIC: No overt anxiety or sadness  Filed Vitals:   02/12/15 1420  BP: 122/64  Pulse: 90  Temp: 97 F (36.1 C)  Resp: 22    Physical Exam  GENERAL APPEARANCE: sleepy but opens eyes to voice, answers appropriately  SKIN: No diaphoresis rash HEENT: Swelling L jaw noted 2 days ago,gone from face , still present under jaw, some TTP, no redness or heat RESPIRATORY: Breathing is even, unlabored. Lung sounds are clear   CARDIOVASCULAR: Heart RRR no murmurs, rubs or gallops. No peripheral edema  GASTROINTESTINAL: Abdomen is soft, non-tender, not distended w/ normal bowel sounds.  GENITOURINARY: Bladder non tender, not distended  MUSCULOSKELETAL: No abnormal joints or musculature NEUROLOGIC: Cranial nerves 2-12 grossly intact.  Patient Active Problem List   Diagnosis Date Noted  . Leukocytosis 02/11/2015  . Sialadenitis 02/10/2015  . Hypocalcemia 02/08/2015  . Bruising 02/07/2015  . Loss of weight 01/16/2015  . Thirst 01/16/2015  . Hypokalemia 01/05/2015  . Laceration 10/05/2014  . Bilateral conjunctivitis 09/14/2014  . DM type 2, uncontrolled, with renal complications 78/46/9629  . Anemia in chronic renal disease 07/08/2014  . Acute posthemorrhagic anemia 07/07/2014  . Hip fracture, right 06/23/2014  . Skin tear of upper arm without complication 52/84/1324  . Unspecified constipation 06/23/2014  . Cough 06/17/2014  . Coronary atherosclerosis of native coronary artery 06/03/2014  . Macrocytosis 05/13/2014  . Conjunctivitis unspecified 05/07/2014  . Orthostatic hypotension 05/02/2014  . Sinusitis 05/02/2014  . Acute encephalopathy 05/02/2014  . Lethargy 04/29/2014  . FTT (failure to thrive) in adult 04/29/2014  . HCAP (healthcare-associated pneumonia) 03/12/2014  . Acute respiratory failure with hypoxia 03/12/2014  . PMR (polymyalgia rheumatica) 03/12/2014  . Dementia with behavioral  disturbance 01/09/2014  . Insomnia 09/23/2013  . Hypogonadism male 05/29/2012  . Hyperparathyroidism 05/29/2012  . CKD (chronic kidney disease) stage 4, GFR 15-29 ml/min 05/29/2012  . COPD (chronic obstructive pulmonary disease) 05/29/2012  . OSA (obstructive sleep apnea) 05/29/2012  . Chronic steroid use 05/29/2012  . Depression   . AS (aortic stenosis)   . AF (paroxysmal atrial fibrillation)   . HYPERTENSION, BENIGN 04/07/2010  . RECTAL BLEEDING 07/21/2008  . PROSTATE CANCER, HX OF 06/20/2008  . VITAMIN B12 DEFICIENCY 06/17/2008  . BACK PAIN 06/15/2008  . Hypothyroidism 04/21/2008  . HYPERLIPIDEMIA 04/21/2008  . Anemia 04/21/2008    CBC    Component Value Date/Time   WBC 9.0 05/01/2014 0426   RBC 3.93* 05/01/2014 0426   RBC 3.06* 06/11/2008 2010   HGB 13.2 05/01/2014 0426   HCT 40.6 05/01/2014 0426   PLT 322 05/01/2014 0426   MCV 103.3* 05/01/2014 0426   LYMPHSABS 1.7 04/29/2014 0834   MONOABS 1.0 04/29/2014 0834   EOSABS 0.3 04/29/2014 0834   BASOSABS 0.0 04/29/2014 0834    CMP     Component Value Date/Time   NA 139 05/01/2014 0426   K 4.2 05/01/2014 0426   CL 103 05/01/2014 0426   CO2 21 05/01/2014 0426   GLUCOSE 118* 05/01/2014 0426   BUN 11 05/01/2014 0426   CREATININE 1.25 05/01/2014 0426   CALCIUM 8.8 05/01/2014 0426   PROT 6.4 04/29/2014 0834   ALBUMIN 3.4* 04/29/2014 0834   AST 31 04/29/2014 0834   ALT 22 04/29/2014 0834   ALKPHOS 68 04/29/2014 0834   BILITOT 0.5 04/29/2014 0834   GFRNONAA 49* 05/01/2014 0426   GFRAA 57* 05/01/2014 0426    Assessment and Plan  CHANGE IN MS accompanied by low BP, that improved once pt was in bed along with pallor;O2 sat while sitting 83%, pt put on O2 [place],  pt in no respiratory distress. On arrival to room pt had good femoral pulse, clear lungs with no work of breathing answering to voice appropriately. Pt has a hx of orthostatic hypotension, this may be what happened. Pt had CBC 2/15 with WBC 28.3 with 89 segs but  he did not look sick when I saw him for his sialadenitis Wed or when Arlo saw him yesterday. CBC from this am with improving WBC 19.0 with 86 segs. This could from sialadenitis or other. CMP from this am is pending, Will check urine and CXR. Plan to re-eval neck Monday as to improvement. Pt's daughter is here today and saw everything, she is very realistic about his progressive dementia and FTT, says main focus is comfort care. We will discuss with famliy before we do anything and question is if pt did go for further studies would he be a candidate for treatment?  Orthostatic hypotension Episode today could be this problem   FTT (failure to thrive) in adult Not eating and drinking as consistantly   Dementia with behavioral disturbance Progressive   Sialadenitis Facial component improved, no change in size to submental swelling   LEUKOCYTOSIS - could be from sialadenitis, looking for other causes, repeat on 2/22  Hennie Duos, MD

## 2015-02-12 NOTE — Assessment & Plan Note (Signed)
Facial component improved, no change in size to submental swelling

## 2015-02-15 ENCOUNTER — Non-Acute Institutional Stay (SKILLED_NURSING_FACILITY): Payer: Medicare Other | Admitting: Internal Medicine

## 2015-02-15 DIAGNOSIS — N184 Chronic kidney disease, stage 4 (severe): Secondary | ICD-10-CM | POA: Diagnosis not present

## 2015-02-15 DIAGNOSIS — R627 Adult failure to thrive: Secondary | ICD-10-CM

## 2015-02-15 DIAGNOSIS — D72829 Elevated white blood cell count, unspecified: Secondary | ICD-10-CM | POA: Diagnosis not present

## 2015-02-16 ENCOUNTER — Encounter: Payer: Self-pay | Admitting: Internal Medicine

## 2015-02-16 ENCOUNTER — Non-Acute Institutional Stay (SKILLED_NURSING_FACILITY): Payer: Medicare Other | Admitting: Internal Medicine

## 2015-02-16 DIAGNOSIS — R627 Adult failure to thrive: Secondary | ICD-10-CM

## 2015-02-16 DIAGNOSIS — N179 Acute kidney failure, unspecified: Secondary | ICD-10-CM | POA: Diagnosis not present

## 2015-02-16 DIAGNOSIS — F03918 Unspecified dementia, unspecified severity, with other behavioral disturbance: Secondary | ICD-10-CM

## 2015-02-16 DIAGNOSIS — H109 Unspecified conjunctivitis: Secondary | ICD-10-CM

## 2015-02-16 DIAGNOSIS — F0391 Unspecified dementia with behavioral disturbance: Secondary | ICD-10-CM

## 2015-02-16 DIAGNOSIS — E870 Hyperosmolality and hypernatremia: Secondary | ICD-10-CM | POA: Diagnosis not present

## 2015-02-16 DIAGNOSIS — K1120 Sialoadenitis, unspecified: Secondary | ICD-10-CM | POA: Diagnosis not present

## 2015-02-16 NOTE — Progress Notes (Signed)
MRN: [id number] Name: [name]  Sex: male Age: 81 y.o. DOB: 22-Apr-1923  [place] #: [name] Facility/Room:411 Level Of Care: SNF Provider: Inocencio Homes D Emergency Contacts: Extended Emergency Contact Information Primary Emergency Contact: Talcott,Britney Address: [address]          [place], [place] [address] [name] of [place] Phone: [phone] Work Phone: [phone] Mobile Phone: [phone] Relation: Son Secondary Emergency Contact: Brown,Vicky Address: [address] [place]          [place], [place] [address] [place] Phone: [phone] Work Phone: [phone] Mobile Phone: [phone] Relation: Daughter  Code Status:DNR   Allergies: Bactrim and Horse-derived products  Chief Complaint  Patient presents with  . Acute Visit    HPI: Patient is 81 y.o. male who is being seen for FTT leading to ARF.  Past Medical History  Diagnosis Date  . CAD (coronary artery disease)     s/p NSTEMI and BMS stent diagonal07/09;  Lexiscan Myoview 9/13:  EF 62%, no ischemia  . AS (aortic stenosis)     a.  Echo 2009 mean gradient 18mm HG. AVA 2.18;  b.  Echo 4/11 mild AS mean gradient 40mm HG;  c.  Echo 04/2011: Mild LVH, EF 42-35%, grade 1 diastolic dysfunction, mild aortic stenosis, mean gradient 14, mild LAE;  d. Echo 9/13: mod LVH, EF 50-55%, mild to mod AS, mean gradient 17 mmHg  . AF (paroxysmal atrial fibrillation)     Holter monitor 2.5 sec pauses  . Fatigue     CPX 11/09 VO2  14.4 (75% predicte)d, slope 35.  O2 pulse normal.  VO2 corrected for body weight 17.6.  Marland Kitchen Hypothyroidism   . Hyperlipidemia   . History of prostate cancer   . Allergic rhinitis   . DM (diabetes mellitus)     type II diet controlled  . Shingles   . Left carotid stenosis   . Vitamin B 12 deficiency   . Anemia   . Osteoarthritis   . Nephrolithiasis   . Obesity   . OSA (obstructive sleep apnea)     AHI 15 PSG 09/06/08  . Hx of colonoscopy   . Stroke   .  Anxiety and depression   . PMR (polymyalgia rheumatica) 05/29/2012  . Hypogonadism male 05/29/2012  . Hyperparathyroidism 05/29/2012  . TIA (transient ischemic attack) 05/29/2012  . CKD (chronic kidney disease) stage 4, GFR 15-29 ml/min 05/29/2012  . COPD (chronic obstructive pulmonary disease) 05/29/2012  . Myocardial infarction   . Cancer     prostate ca history    Past Surgical History  Procedure Laterality Date  . Appendectomy    . Hernia repair    . Right carpal tunnel release    . Lumbar spine surgery    . Cystectomy      neck  . Prostatectomy    . Vasectomy    . Total knee arthroplasty      right  . Cholecystectomy        Medication List       This list is accurate as of: 02/16/15  8:45 PM.  Always use your most recent med list.               albuterol 108 (90 BASE) MCG/ACT inhaler  Commonly known as:  PROVENTIL HFA;VENTOLIN HFA  Inhale 2 puffs into the lungs every 6 (six) hours as needed for wheezing or shortness of breath.     buPROPion 300 MG 24 hr tablet  Commonly known as:  WELLBUTRIN XL  Take 300 mg by mouth every morning.     clopidogrel 75 MG tablet  Commonly known as:  PLAVIX  Take 75 mg by mouth every evening.     folic acid 1 MG tablet  Commonly known as:  FOLVITE  Take 1 mg by mouth every morning.     guaiFENesin 600 MG 12 hr tablet  Commonly known as:  MUCINEX  Take by mouth 2 (two) times daily.     HYDROcodone-acetaminophen 10-325 MG per tablet  Commonly known as:  NORCO  Take 1 tablet by mouth every 4 (four) hours as needed for severe pain.     ipratropium-albuterol 0.5-2.5 (3) MG/3ML Soln  Commonly known as:  DUONEB  Take 3 mLs by nebulization 4 (four) times daily.     levothyroxine 137 MCG tablet  Commonly known as:  SYNTHROID, LEVOTHROID  Take 137 mcg by mouth every evening. Before dinner     methotrexate 2.5 MG tablet  Commonly known as:  RHEUMATREX  Take 10 mg by mouth once a week. Takes 4 tablets weekly on Wednesday.    Caution:Chemotherapy. Protect from light.     multivitamin with minerals Tabs tablet  Take 1 tablet by mouth every morning.     pantoprazole 40 MG tablet  Commonly known as:  PROTONIX  Take 40 mg by mouth every morning.     polyethylene glycol packet  Commonly known as:  MIRALAX / GLYCOLAX  Take 17 g by mouth daily.     risperiDONE 1 MG tablet  Commonly known as:  RISPERDAL  Take 1 mg by mouth every evening. Give daily at 5pm     sertraline 100 MG tablet  Commonly known as:  ZOLOFT  Take 100 mg by mouth at bedtime.     SYSTANE BALANCE OP  Apply 2 drops to eye 2 (two) times daily.     traMADol 50 MG tablet  Commonly known as:  ULTRAM  Take 50 mg by mouth 2 (two) times daily.     vitamin C 500 MG tablet  Commonly known as:  ASCORBIC ACID  Take 500 mg by mouth at bedtime.        No orders of the defined types were placed in this encounter.    Immunization History  Administered Date(s) Administered  . Influenza Split 09/03/2012  . Influenza,inj,Quad PF,36+ Mos 09/23/2013  . Influenza-Unspecified 10/05/2011, 10/12/2014  . Pneumococcal Conjugate-13 04/25/2011, 09/23/2013  . Tdap 04/24/2012    History  Substance Use Topics  . Smoking status: Former Smoker -- 0.50 packs/day for 25 years    Types: Cigarettes    Quit date: 12/25/1958  . Smokeless tobacco: Former Systems developer    Types: Snuff, [name]    Quit date: 08/06/1970     Comment: quit in 1950s  . Alcohol Use: No    Review of Systems  DATA OBTAINED: from nurse, good day yesterday, sat up and ate, spoke all day GENERAL:  no fevers SKIN: No itching, rash HEENT: mass under jaw softer RESPIRATORY: No cough, wheezing, SOB CARDIAC: No chest pain, palpitations, lower extremity edema  GI: No abdominal pain, No N/V/D or constipation, No heartburn or reflux  GU: No dysuria, frequency or urgency, or incontinence  MUSCULOSKELETAL: No unrelieved bone/joint pain NEUROLOGIC: No headache  PSYCHIATRIC: No overt anxiety or  sadness  Filed Vitals:   02/16/15 1918  BP: 101/60  Pulse: 94  Temp: 98.2 F (36.8 C)  Resp: 18    Physical  Exam  GENERAL APPEARANCE: somnalent minconversant, No acute distress  SKIN: No diaphoresis rash HEENT: submental swelling smaller in size and softer RESPIRATORY: Breathing is even, unlabored. Lung sounds are clear   CARDIOVASCULAR: Heart RRR no murmurs, rubs or gallops. No peripheral edema  GASTROINTESTINAL: Abdomen is soft, non-tender, not distended w/ normal bowel sounds.  GENITOURINARY: Bladder non tender, not distended  MUSCULOSKELETAL: No abnormal joints or musculature NEUROLOGIC: Cranial nerves 2-12 grossly intact, short appropriate answers PSYCHIATRIC: somnalent no behavioral issues  Patient Active Problem List   Diagnosis Date Noted  . Prerenal acute renal failure 02/16/2015  . Hypernatremia 02/16/2015  . Leukocytosis 02/11/2015  . Sialadenitis 02/10/2015  . Hypocalcemia 02/08/2015  . Bruising 02/07/2015  . Loss of weight 01/16/2015  . Thirst 01/16/2015  . Hypokalemia 01/05/2015  . Laceration 10/05/2014  . Bilateral conjunctivitis 09/14/2014  . DM type 2, uncontrolled, with renal complications 36/64/4034  . Anemia in chronic renal disease 07/08/2014  . Acute posthemorrhagic anemia 07/07/2014  . Hip fracture, right 06/23/2014  . Skin tear of upper arm without complication 74/25/9563  . Unspecified constipation 06/23/2014  . Cough 06/17/2014  . Coronary atherosclerosis of native coronary artery 06/03/2014  . Macrocytosis 05/13/2014  . Conjunctivitis unspecified 05/07/2014  . Orthostatic hypotension 05/02/2014  . Sinusitis 05/02/2014  . Acute encephalopathy 05/02/2014  . Lethargy 04/29/2014  . FTT (failure to thrive) in adult 04/29/2014  . HCAP (healthcare-associated pneumonia) 03/12/2014  . Acute respiratory failure with hypoxia 03/12/2014  . PMR (polymyalgia rheumatica) 03/12/2014  . Dementia with behavioral disturbance 01/09/2014  . Insomnia  09/23/2013  . Hypogonadism male 05/29/2012  . Hyperparathyroidism 05/29/2012  . CKD (chronic kidney disease) stage 4, GFR 15-29 ml/min 05/29/2012  . COPD (chronic obstructive pulmonary disease) 05/29/2012  . OSA (obstructive sleep apnea) 05/29/2012  . Chronic steroid use 05/29/2012  . Depression   . AS (aortic stenosis)   . AF (paroxysmal atrial fibrillation)   . HYPERTENSION, BENIGN 04/07/2010  . RECTAL BLEEDING 07/21/2008  . PROSTATE CANCER, HX OF 06/20/2008  . VITAMIN B12 DEFICIENCY 06/17/2008  . BACK PAIN 06/15/2008  . Hypothyroidism 04/21/2008  . HYPERLIPIDEMIA 04/21/2008  . Anemia 04/21/2008    CBC    Component Value Date/Time   WBC 9.0 05/01/2014 0426   RBC 3.93* 05/01/2014 0426   RBC 3.06* 06/11/2008 2010   HGB 13.2 05/01/2014 0426   HCT 40.6 05/01/2014 0426   PLT 322 05/01/2014 0426   MCV 103.3* 05/01/2014 0426   LYMPHSABS 1.7 04/29/2014 0834   MONOABS 1.0 04/29/2014 0834   EOSABS 0.3 04/29/2014 0834   BASOSABS 0.0 04/29/2014 0834    CMP     Component Value Date/Time   NA 139 05/01/2014 0426   K 4.2 05/01/2014 0426   CL 103 05/01/2014 0426   CO2 21 05/01/2014 0426   GLUCOSE 118* 05/01/2014 0426   BUN 11 05/01/2014 0426   CREATININE 1.25 05/01/2014 0426   CALCIUM 8.8 05/01/2014 0426   PROT 6.4 04/29/2014 0834   ALBUMIN 3.4* 04/29/2014 0834   AST 31 04/29/2014 0834   ALT 22 04/29/2014 0834   ALKPHOS 68 04/29/2014 0834   BILITOT 0.5 04/29/2014 0834   GFRNONAA 49* 05/01/2014 0426   GFRAA 57* 05/01/2014 0426    Assessment and Plan  Prerenal acute renal failure Labs ordered for this am consolidated decisions. As per prior notes, pt has been failing, predictably decreased po intake. Today Na+ 159, BUN 47/Cr 2.8. IVF, it's now or never. Spoke to son , [place], then daughter.  Together decided no intervention, they have seen Dad failing, comfort is the important thing. I agree. Hospice has been consulted today.   Hypernatremia 2/2 to dehydration , poor po  intake. Will not be addressing except for comfort.   Sialadenitis Improving, day 6 of antibiotics WBC has fallen to 13.2, given dehydration it's really less than that. Submental swelling is smaller and softer.   Bilateral conjunctivitis Long term problem, finally solved with patanol.   FTT (failure to thrive) in adult With precipitous decline in past month leading to ARF and hypernatremia   Dementia with behavioral disturbance Progressive and progressing     Hennie Duos, MD

## 2015-02-16 NOTE — Assessment & Plan Note (Signed)
Improving, day 6 of antibiotics WBC has fallen to 13.2, given dehydration it's really less than that. Submental swelling is smaller and softer.

## 2015-02-16 NOTE — Assessment & Plan Note (Signed)
Long term problem, finally solved with patanol.

## 2015-02-16 NOTE — Assessment & Plan Note (Signed)
With precipitous decline in past month leading to ARF and hypernatremia

## 2015-02-16 NOTE — Assessment & Plan Note (Signed)
Labs ordered for this am consolidated decisions. As per prior notes, pt has been failing, predictably decreased po intake. Today Na+ 159, BUN 47/Cr 2.8. IVF, it's now or never. Spoke to son , [place], then daughter. Together decided no intervention, they have seen Dad failing, comfort is the important thing. I agree. Hospice has been consulted today.

## 2015-02-16 NOTE — Assessment & Plan Note (Signed)
Progressive and progressing

## 2015-02-16 NOTE — Assessment & Plan Note (Signed)
2/2 to dehydration , poor po intake. Will not be addressing except for comfort.

## 2015-02-20 ENCOUNTER — Encounter: Payer: Self-pay | Admitting: Internal Medicine

## 2015-02-20 NOTE — Progress Notes (Signed)
Patient ID: [name], male   DOB: [birth year], 81 y.o.   MRN: [id number]   this is an acute visit.  Level care skilled.  [facility].   . Acute Visit  Follow-up leukocytosis-recent unresponsive episode-failure to thrive-suspected siadladenitis  HPI: Patient is 81 y.o. male who has dementia and FTT was found in his WC with decreased responsiveness late last week. As he was layed down in the bed this improved. -He was assessed by Dr. Sheppard Coil who was in the facility it was felt this possibly could be an orthostatic hypotension issue he does have a history of this-there are been no further episodes over the weekend.  He  continues to have a poor appetite complicated with what appears to be progressing dementia  He also was treated for suspected  Sialadenitis with Augmentin -.  His white count actually has come down to 13.2 it had been significantly elevated at over 20,000 last week.  He has been afebrile currently is no complaints but he is a poor historian secondary to dementia  He does have a history of renal insufficiency  most recent creatinine was 1.5 BUN 29 on February 15.  Vital signs appear to be stable systolic breast pressure is somewhat low although this is quite variable at times.   l .  .  Past Medical History  Diagnosis Date  . CAD (coronary artery disease)     s/p NSTEMI and BMS stent diagonal07/09;  Lexiscan Myoview 9/13:  EF 62%, no ischemia  . AS (aortic stenosis)     a.  Echo 2009 mean gradient 67mm HG. AVA 2.18;  b.  Echo 4/11 mild AS mean gradient 39mm HG;  c.  Echo 04/2011: Mild LVH, EF 99-37%, grade 1 diastolic dysfunction, mild aortic stenosis, mean gradient 14, mild LAE;  d. Echo 9/13: mod LVH, EF 50-55%, mild to mod AS, mean gradient 17 mmHg  . AF (paroxysmal atrial fibrillation)     Holter monitor 2.5 sec pauses  . Fatigue     CPX 11/09 VO2  14.4 (75% predicte)d, slope 35.  O2 pulse normal.  VO2 corrected for body weight 17.6.  Marland Kitchen  Hypothyroidism   . Hyperlipidemia   . History of prostate cancer   . Allergic rhinitis   . DM (diabetes mellitus)     type II diet controlled  . Shingles   . Left carotid stenosis   . Vitamin B 12 deficiency   . Anemia   . Osteoarthritis   . Nephrolithiasis   . Obesity   . OSA (obstructive sleep apnea)     AHI 15 PSG 09/06/08  . Hx of colonoscopy   . Stroke   . Anxiety and depression   . PMR (polymyalgia rheumatica) 05/29/2012  . Hypogonadism male 05/29/2012  . Hyperparathyroidism 05/29/2012  . TIA (transient ischemic attack) 05/29/2012  . CKD (chronic kidney disease) stage 4, GFR 15-29 ml/min 05/29/2012  . COPD (chronic obstructive pulmonary disease) 05/29/2012  . Myocardial infarction   . Cancer     prostate ca history    Past Surgical History  Procedure Laterality Date  . Appendectomy    . Hernia repair    . Right carpal tunnel release    . Lumbar spine surgery    . Cystectomy      neck  . Prostatectomy    . Vasectomy    . Total knee arthroplasty      right  . Cholecystectomy        Medication List       .  albuterol 108 (90 BASE) MCG/ACT inhaler  Commonly known as:  PROVENTIL HFA;VENTOLIN HFA  Inhale 2 puffs into the lungs every 6 (six) hours as needed for wheezing or shortness of breath.     buPROPion 300 MG 24 hr tablet  Commonly known as:  WELLBUTRIN XL  Take 300 mg by mouth every morning.     clopidogrel 75 MG tablet  Commonly known as:  PLAVIX  Take 75 mg by mouth every evening.     folic acid 1 MG tablet  Commonly known as:  FOLVITE  Take 1 mg by mouth every morning.     guaiFENesin 600 MG 12 hr tablet  Commonly known as:  MUCINEX  Take by mouth 2 (two) times daily.     HYDROcodone-acetaminophen 10-325 MG per tablet  Commonly known as:  NORCO  Take 1 tablet by mouth every 4 (four) hours as needed for severe pain.     ipratropium-albuterol 0.5-2.5 (3) MG/3ML Soln  Commonly known as:  DUONEB  Take 3 mLs by nebulization 4 (four)  times daily.     levothyroxine 137 MCG tablet  Commonly known as:  SYNTHROID, LEVOTHROID  Take 137 mcg by mouth every evening. Before dinner     methotrexate 2.5 MG tablet  Commonly known as:  RHEUMATREX  Take 10 mg by mouth once a week. Takes 4 tablets weekly on Wednesday.   Caution:Chemotherapy. Protect from light.     multivitamin with minerals Tabs tablet  Take 1 tablet by mouth every morning.     pantoprazole 40 MG tablet  Commonly known as:  PROTONIX  Take 40 mg by mouth every morning.     polyethylene glycol packet  Commonly known as:  MIRALAX / GLYCOLAX  Take 17 g by mouth daily.     risperiDONE 1 MG tablet  Commonly known as:  RISPERDAL  Take 1 mg by mouth every evening. Give daily at 5pm     sertraline 100 MG tablet  Commonly known as:  ZOLOFT  Take 100 mg by mouth at bedtime.     SYSTANE BALANCE OP  Apply 2 drops to eye 2 (two) times daily.     traMADol 50 MG tablet  Commonly known as:  ULTRAM  Take 50 mg by mouth 2 (two) times daily.     vitamin C 500 MG tablet  Commonly known as:  ASCORBIC ACID  Take 500 mg by mouth at bedtime.        No orders of the defined types were placed in this encounter.    Immunization History  Administered Date(s) Administered  . Influenza Split 09/03/2012  . Influenza,inj,Quad PF,36+ Mos 09/23/2013  . Influenza-Unspecified 10/05/2011, 10/12/2014  . Pneumococcal Conjugate-13 04/25/2011, 09/23/2013  . Tdap 04/24/2012    History  Substance Use Topics  . Smoking status: Former Smoker -- 0.50 packs/day for 25 years    Types: Cigarettes    Quit date: 12/25/1958  . Smokeless tobacco: Former Systems developer    Types: Snuff, [name]    Quit date: 08/06/1970     Comment: quit in 1950s  . Alcohol Use: No    Review of Systems  DATA OBTAINED: from patient, nurse, medical record, GENERAL:  no fevers, fatigue, appetite changes-- quite spotty  SKIN: No itching, rash HEENT: No complaint RESPIRATORY: No cough, wheezing, SOB CARDIAC:  No chest pain, palpitations, lower extremity edema  GI: No abdominal pain, No N/V/D or constipation, No heartburn or reflux  GU: No dysuria, frequency or urgency, or incontinence  MUSCULOSKELETAL: No unrelieved bone/joint pain NEUROLOGIC: No headache, dizziness Or unresponsive episode since Friday   PSYCHIATRIC: No overt anxiety or sadness                      Physical Exam  temperature is 97.4 pulse 97 respirations 20 blood pressure 94/62  GENERAL APPEARANCE: appear somewhat sleepy but easily arousable and responsive  SKIN: No diaphoresis rash HEENT: Swelling to left jaw appears to be resolving, some TTP, no redness or heat RESPIRATORY: Breathing is even, unlabored. Lung sounds are clear--there is poor effort    CARDIOVASCULAR: Heart RRR no murmurs, rubs or gallops. No peripheral edema  GASTROINTESTINAL: Abdomen is soft, non-tender, not distended w/ normal bowel sounds.  GENITOURINARY: Bladder non tender, not distended  MUSCULOSKELETAL: No abnormal joints or musculature NEUROLOGIC: Cranial nerves 2-12 grossly intact--appear somewhat sleepy but easily arousable.  Patient Active Problem List   Diagnosis Date Noted  . Leukocytosis 02/11/2015  . Sialadenitis 02/10/2015  . Hypocalcemia 02/08/2015  . Bruising 02/07/2015  . Loss of weight 01/16/2015  . Thirst 01/16/2015  . Hypokalemia 01/05/2015  . Laceration 10/05/2014  . Bilateral conjunctivitis 09/14/2014  . DM type 2, uncontrolled, with renal complications 38/09/1750  . Anemia in chronic renal disease 07/08/2014  . Acute posthemorrhagic anemia 07/07/2014  . Hip fracture, right 06/23/2014  . Skin tear of upper arm without complication 02/58/5277  . Unspecified constipation 06/23/2014  . Cough 06/17/2014  . Coronary atherosclerosis of native coronary artery 06/03/2014  . Macrocytosis 05/13/2014  . Conjunctivitis unspecified 05/07/2014  . Orthostatic hypotension 05/02/2014  . Sinusitis 05/02/2014  . Acute encephalopathy  05/02/2014  . Lethargy 04/29/2014  . FTT (failure to thrive) in adult 04/29/2014  . HCAP (healthcare-associated pneumonia) 03/12/2014  . Acute respiratory failure with hypoxia 03/12/2014  . PMR (polymyalgia rheumatica) 03/12/2014  . Dementia with behavioral disturbance 01/09/2014  . Insomnia 09/23/2013  . Hypogonadism male 05/29/2012  . Hyperparathyroidism 05/29/2012  . CKD (chronic kidney disease) stage 4, GFR 15-29 ml/min 05/29/2012  . COPD (chronic obstructive pulmonary disease) 05/29/2012  . OSA (obstructive sleep apnea) 05/29/2012  . Chronic steroid use 05/29/2012  . Depression   . AS (aortic stenosis)   . AF (paroxysmal atrial fibrillation)   . HYPERTENSION, BENIGN 04/07/2010  . RECTAL BLEEDING 07/21/2008  . PROSTATE CANCER, HX OF 06/20/2008  . VITAMIN B12 DEFICIENCY 06/17/2008  . BACK PAIN 06/15/2008  . Hypothyroidism 04/21/2008  . HYPERLIPIDEMIA 04/21/2008  . Anemia 04/21/2008    labs.  02/15/2015.  WBC 13.2 hemoglobin 8.5 platelets 325.  On February 15 white count was 28.3.  On February 19 hemoglobin was 8.8.  02/08/2015.  Sodium 142 potassium 3.8 BUN 29 creatinine 1.5  CBC    Component Value Date/Time   WBC 9.0 05/01/2014 0426   RBC 3.93* 05/01/2014 0426   RBC 3.06* 06/11/2008 2010   HGB 13.2 05/01/2014 0426   HCT 40.6 05/01/2014 0426   PLT 322 05/01/2014 0426   MCV 103.3* 05/01/2014 0426   LYMPHSABS 1.7 04/29/2014 0834   MONOABS 1.0 04/29/2014 0834   EOSABS 0.3 04/29/2014 0834   BASOSABS 0.0 04/29/2014 0834    CMP     Component Value Date/Time   NA 139 05/01/2014 0426   K 4.2 05/01/2014 0426   CL 103 05/01/2014 0426   CO2 21 05/01/2014 0426   GLUCOSE 118* 05/01/2014 0426   BUN 11 05/01/2014 0426   CREATININE 1.25 05/01/2014 0426   CALCIUM 8.8 05/01/2014 0426   PROT 6.4  04/29/2014 0834   ALBUMIN 3.4* 04/29/2014 0834   AST 31 04/29/2014 0834   ALT 22 04/29/2014 0834   ALKPHOS 68 04/29/2014 0834   BILITOT 0.5 04/29/2014 0834   GFRNONAA  49* 05/01/2014 0426   GFRAA 57* 05/01/2014 0426    Assessment and Plan  CHANGE IN MS accompanied by low BP--unresponsive episode-there's been no reoccurrence at this point appears very frail but relatively stable.  A chest x-ray showed scarring versus possible infiltrate again he has been on Augmentin and does not appear to have acute respiratory changes. cough or shortness of breath   urine culture appear to be negative   Orthostatic hypotension Stated above      FTT (failure to thrive) in adult Not eating and drinking as consistantly--suspect this could be do to progressing dementia  History of renal insufficiency-will update a metabolic panel especially in light of patient's spotty by mouth intake-   Dementia with behavioral disturbance Progressive--per Dr. Redgie Grayer previous assessment it appears family's approach will be quite conservative    Sialadenitis This appears to be continuing to resolve Dr. Sheppard Coil did look for other causes including ordering the urine culture and chest x-ray as noted above--neither appear to be acutely concerning-  LEUKOCYTOSIS - could be from sialadenitis,   [id number]

## 2015-02-23 DEATH — deceased
